# Patient Record
Sex: Male | Born: 1950
Health system: Southern US, Community
[De-identification: ages and names within clinical notes are randomized; demographics above are authoritative.]

## PROBLEM LIST (undated history)

## (undated) DIAGNOSIS — F32A Depression, unspecified: Secondary | ICD-10-CM

## (undated) DIAGNOSIS — M1990 Unspecified osteoarthritis, unspecified site: Secondary | ICD-10-CM

## (undated) DIAGNOSIS — I499 Cardiac arrhythmia, unspecified: Secondary | ICD-10-CM

## (undated) DIAGNOSIS — I739 Peripheral vascular disease, unspecified: Secondary | ICD-10-CM

## (undated) DIAGNOSIS — F419 Anxiety disorder, unspecified: Secondary | ICD-10-CM

## (undated) DIAGNOSIS — I480 Paroxysmal atrial fibrillation: Secondary | ICD-10-CM

## (undated) DIAGNOSIS — J449 Chronic obstructive pulmonary disease, unspecified: Secondary | ICD-10-CM

## (undated) DIAGNOSIS — I639 Cerebral infarction, unspecified: Secondary | ICD-10-CM

## (undated) DIAGNOSIS — R7989 Other specified abnormal findings of blood chemistry: Secondary | ICD-10-CM

## (undated) DIAGNOSIS — I219 Acute myocardial infarction, unspecified: Secondary | ICD-10-CM

## (undated) DIAGNOSIS — I10 Essential (primary) hypertension: Secondary | ICD-10-CM

## (undated) DIAGNOSIS — K219 Gastro-esophageal reflux disease without esophagitis: Secondary | ICD-10-CM

## (undated) DIAGNOSIS — K265 Chronic or unspecified duodenal ulcer with perforation: Secondary | ICD-10-CM

## (undated) DIAGNOSIS — I700 Atherosclerosis of aorta: Secondary | ICD-10-CM

## (undated) DIAGNOSIS — H5440 Blindness, one eye, unspecified eye: Secondary | ICD-10-CM

## (undated) HISTORY — DX: Peripheral vascular disease, unspecified: I73.9

## (undated) HISTORY — PX: EYE SURGERY: SHX253

## (undated) HISTORY — DX: Paroxysmal atrial fibrillation: I48.0

## (undated) HISTORY — PX: FRACTURE SURGERY: SHX138

---

## 2006-08-02 ENCOUNTER — Emergency Department (HOSPITAL_COMMUNITY): Admission: EM | Admit: 2006-08-02 | Discharge: 2006-08-03 | Payer: Self-pay | Admitting: Emergency Medicine

## 2007-10-24 ENCOUNTER — Ambulatory Visit (HOSPITAL_BASED_OUTPATIENT_CLINIC_OR_DEPARTMENT_OTHER): Admission: RE | Admit: 2007-10-24 | Discharge: 2007-10-24 | Payer: Self-pay

## 2007-10-27 ENCOUNTER — Ambulatory Visit: Payer: Self-pay | Admitting: Internal Medicine

## 2010-07-05 NOTE — Procedures (Signed)
NAMEASHAWN, RINEHART              ACCOUNT NO.:  [id number]   MEDICAL RECORD NO.:  [id number]          PATIENT TYPE:  OUT   LOCATION:  SLEEP CENTER                 FACILITY:  [facility]   PHYSICIAN:  Clinton D. Maple Hudson, MD, FCCP, FACPDATE OF BIRTH:  10/28/48   DATE OF STUDY:  10/24/2007                            NOCTURNAL POLYSOMNOGRAM   REFERRING PHYSICIAN:   REFERRING PHYSICIAN:  Dr. Jaci Lazier.   INDICATION FOR STUDY:  Hypersomnia with sleep apnea.   EPWORTH SLEEPINESS SCORE:  Endorsed by patient as 0/24.  BMI 32.2.  Weight 245 pounds.  Height 73 inches.  Neck 18 inches.   MEDICATIONS:  Home medication charted and reviewed.  Bedtime medicine  included  omeprazole, rosuvastatin, terazosin, trazodone,  cyanocobalamin.   SLEEP ARCHITECTURE:  Total sleep time 357 minutes with sleep efficiency  79.8%.  Stage I was 7.8%, stage II 72.7%, stage III absent, REM 19.4% of  total sleep time.  Sleep latency 27 minutes, REM latency 275 minutes.  Awake after sleep onset 63 minutes.  Arousal index 13.4.   RESPIRATORY DATA:  Apnea-hypopnea index (AHI) 4 per hour.  A total of 24  events were scored including 13 obstructive apnea, 9 central apneas and  2 hypopneas.  Events were primarily recorded as nonsupine.  REM AHI 15.5  per hour.  There were insufficient events to permit CPAP titration by  split study protocol on this study night.   OXYGEN DATA:  Mild to moderate snoring with oxygen desaturation to a  nadir of 88%.  Mean oxygen saturation through the study was 93.7% on  room air.   CARDIAC DATA:  Sinus rhythm with occasional PAC.   MOVEMENT-PARASOMNIA:  A total of 197 limb jerks were countered, of which  24 were associated with arousal of awakening for a PLMA index of 4 per  hour.   IMPRESSIONS-RECOMMENDATIONS:  1. Occasional respiratory event with sleep disturbance, apnea-hypopnea      index 4 per hour (normal range 0-5 per hour).  This is within      normal range.  Events were  primarily nonsupine, associated with      mild to moderate snoring and oxygen desaturation to a nadir of 88%.  2. Periodic limb movement with arousal, 4 per hour.  The patient      already takes a number of medications, some of      which may affect sleep quality.  Consider whether therapeutic trial      of a specific medication addressing limb movement, such as Requip      or Mirapex, or clonazepam would be clinically appropriate.      Clinton D. Maple Hudson, MD, FCCP, FACP  Diplomate, Biomedical engineer of Sleep Medicine  Electronically Signed     CDY/MEDQ  D:  10/27/2007 14:06:00  T:  10/28/2007 10:39:44  Job:  [id number]

## 2010-12-08 LAB — BASIC METABOLIC PANEL
BUN: 7
Creatinine, Ser: 1.2
GFR calc non Af Amer: >60

## 2010-12-08 LAB — RAPID URINE DRUG SCREEN, HOSP PERFORMED
Amphetamines: NOT DETECTED
Benzodiazepines: NOT DETECTED

## 2010-12-08 LAB — CBC
Platelets: 244
WBC: 5.5

## 2010-12-08 LAB — URINALYSIS, ROUTINE W REFLEX MICROSCOPIC
Glucose, UA: NEGATIVE
Hgb urine dipstick: NEGATIVE
Ketones, ur: NEGATIVE
Protein, ur: NEGATIVE

## 2010-12-08 LAB — ETHANOL: Alcohol, Ethyl (B): 137 — ABNORMAL HIGH

## 2010-12-08 LAB — DIFFERENTIAL
Eosinophils Absolute: 0.4
Lymphocytes Relative: 28
Lymphs Abs: 1.5
Neutrophils Relative %: 54

## 2010-12-08 LAB — TRICYCLICS SCREEN, URINE: TCA Scrn: NOT DETECTED

## 2015-05-09 ENCOUNTER — Encounter (HOSPITAL_COMMUNITY): Payer: Self-pay | Admitting: Emergency Medicine

## 2015-05-09 ENCOUNTER — Inpatient Hospital Stay (HOSPITAL_COMMUNITY)
Admission: EM | Admit: 2015-05-09 | Discharge: 2015-05-18 | DRG: 329 | Disposition: A | Discharge status: Home Health | Payer: Medicare HMO | Attending: General Surgery | Admitting: General Surgery

## 2015-05-09 DIAGNOSIS — Z8673 Personal history of transient ischemic attack (TIA), and cerebral infarction without residual deficits: Secondary | ICD-10-CM

## 2015-05-09 DIAGNOSIS — J9601 Acute respiratory failure with hypoxia: Secondary | ICD-10-CM | POA: Diagnosis not present

## 2015-05-09 DIAGNOSIS — K6389 Other specified diseases of intestine: Secondary | ICD-10-CM | POA: Diagnosis not present

## 2015-05-09 DIAGNOSIS — R0602 Shortness of breath: Secondary | ICD-10-CM | POA: Diagnosis not present

## 2015-05-09 DIAGNOSIS — J449 Chronic obstructive pulmonary disease, unspecified: Secondary | ICD-10-CM | POA: Diagnosis not present

## 2015-05-09 DIAGNOSIS — I10 Essential (primary) hypertension: Secondary | ICD-10-CM | POA: Diagnosis present

## 2015-05-09 DIAGNOSIS — Z23 Encounter for immunization: Secondary | ICD-10-CM | POA: Diagnosis not present

## 2015-05-09 DIAGNOSIS — J95821 Acute postprocedural respiratory failure: Secondary | ICD-10-CM | POA: Diagnosis not present

## 2015-05-09 DIAGNOSIS — A419 Sepsis, unspecified organism: Secondary | ICD-10-CM | POA: Diagnosis not present

## 2015-05-09 DIAGNOSIS — E876 Hypokalemia: Secondary | ICD-10-CM | POA: Diagnosis present

## 2015-05-09 DIAGNOSIS — R109 Unspecified abdominal pain: Secondary | ICD-10-CM | POA: Diagnosis not present

## 2015-05-09 DIAGNOSIS — R188 Other ascites: Secondary | ICD-10-CM | POA: Diagnosis present

## 2015-05-09 DIAGNOSIS — E669 Obesity, unspecified: Secondary | ICD-10-CM | POA: Diagnosis not present

## 2015-05-09 DIAGNOSIS — F1010 Alcohol abuse, uncomplicated: Secondary | ICD-10-CM

## 2015-05-09 DIAGNOSIS — R198 Other specified symptoms and signs involving the digestive system and abdomen: Secondary | ICD-10-CM

## 2015-05-09 DIAGNOSIS — G9340 Encephalopathy, unspecified: Secondary | ICD-10-CM | POA: Diagnosis not present

## 2015-05-09 DIAGNOSIS — K631 Perforation of intestine (nontraumatic): Secondary | ICD-10-CM | POA: Diagnosis not present

## 2015-05-09 DIAGNOSIS — R1084 Generalized abdominal pain: Secondary | ICD-10-CM | POA: Diagnosis not present

## 2015-05-09 DIAGNOSIS — K265 Chronic or unspecified duodenal ulcer with perforation: Principal | ICD-10-CM | POA: Diagnosis present

## 2015-05-09 DIAGNOSIS — F17200 Nicotine dependence, unspecified, uncomplicated: Secondary | ICD-10-CM

## 2015-05-09 DIAGNOSIS — K2980 Duodenitis without bleeding: Secondary | ICD-10-CM | POA: Diagnosis present

## 2015-05-09 DIAGNOSIS — Z48815 Encounter for surgical aftercare following surgery on the digestive system: Secondary | ICD-10-CM | POA: Diagnosis not present

## 2015-05-09 DIAGNOSIS — Z720 Tobacco use: Secondary | ICD-10-CM

## 2015-05-09 DIAGNOSIS — T8189XA Other complications of procedures, not elsewhere classified, initial encounter: Secondary | ICD-10-CM | POA: Diagnosis not present

## 2015-05-09 DIAGNOSIS — J9811 Atelectasis: Secondary | ICD-10-CM | POA: Diagnosis not present

## 2015-05-09 DIAGNOSIS — Z91199 Patient's noncompliance with other medical treatment and regimen due to unspecified reason: Secondary | ICD-10-CM | POA: Insufficient documentation

## 2015-05-09 DIAGNOSIS — Z9119 Patient's noncompliance with other medical treatment and regimen: Secondary | ICD-10-CM | POA: Diagnosis not present

## 2015-05-09 DIAGNOSIS — F17210 Nicotine dependence, cigarettes, uncomplicated: Secondary | ICD-10-CM | POA: Diagnosis not present

## 2015-05-09 DIAGNOSIS — K659 Peritonitis, unspecified: Secondary | ICD-10-CM | POA: Diagnosis not present

## 2015-05-09 DIAGNOSIS — I252 Old myocardial infarction: Secondary | ICD-10-CM | POA: Diagnosis not present

## 2015-05-09 DIAGNOSIS — J9691 Respiratory failure, unspecified with hypoxia: Secondary | ICD-10-CM

## 2015-05-09 DIAGNOSIS — Z602 Problems related to living alone: Secondary | ICD-10-CM | POA: Diagnosis present

## 2015-05-09 DIAGNOSIS — Z452 Encounter for adjustment and management of vascular access device: Secondary | ICD-10-CM | POA: Diagnosis not present

## 2015-05-09 DIAGNOSIS — K266 Chronic or unspecified duodenal ulcer with both hemorrhage and perforation: Secondary | ICD-10-CM | POA: Diagnosis not present

## 2015-05-09 DIAGNOSIS — K261 Acute duodenal ulcer with perforation: Secondary | ICD-10-CM | POA: Diagnosis not present

## 2015-05-09 DIAGNOSIS — R0600 Dyspnea, unspecified: Secondary | ICD-10-CM | POA: Diagnosis not present

## 2015-05-09 DIAGNOSIS — R531 Weakness: Secondary | ICD-10-CM | POA: Diagnosis not present

## 2015-05-09 DIAGNOSIS — Z6831 Body mass index (BMI) 31.0-31.9, adult: Secondary | ICD-10-CM

## 2015-05-09 HISTORY — DX: Essential (primary) hypertension: I10

## 2015-05-09 HISTORY — DX: Cerebral infarction, unspecified: I63.9

## 2015-05-09 HISTORY — DX: Unspecified osteoarthritis, unspecified site: M19.90

## 2015-05-09 HISTORY — DX: Acute myocardial infarction, unspecified: I21.9

## 2015-05-09 NOTE — ED Notes (Signed)
Pt presents to ER from home with [facility] who reports patient called 911 for generalized weakness and diarrhea x 3 days; pt also reports abd pain 10/10 that began tonight and SOB; EMS reports while on scene pt became diaphoretic when changing from sitting to standing; pt reports abd more distended and taught than normal

## 2015-05-10 ENCOUNTER — Encounter (HOSPITAL_COMMUNITY): Payer: Self-pay | Admitting: Radiology

## 2015-05-10 ENCOUNTER — Emergency Department (HOSPITAL_COMMUNITY): Payer: Medicare HMO

## 2015-05-10 ENCOUNTER — Encounter (HOSPITAL_COMMUNITY): Admission: EM | Disposition: A | Discharge status: Home Health | Payer: Self-pay | Source: Home / Self Care

## 2015-05-10 ENCOUNTER — Inpatient Hospital Stay (HOSPITAL_COMMUNITY): Payer: Medicare HMO

## 2015-05-10 ENCOUNTER — Emergency Department (HOSPITAL_COMMUNITY): Payer: Medicare HMO | Admitting: Anesthesiology

## 2015-05-10 DIAGNOSIS — J9601 Acute respiratory failure with hypoxia: Secondary | ICD-10-CM | POA: Diagnosis not present

## 2015-05-10 DIAGNOSIS — R188 Other ascites: Secondary | ICD-10-CM | POA: Diagnosis not present

## 2015-05-10 DIAGNOSIS — I10 Essential (primary) hypertension: Secondary | ICD-10-CM | POA: Diagnosis not present

## 2015-05-10 DIAGNOSIS — F17210 Nicotine dependence, cigarettes, uncomplicated: Secondary | ICD-10-CM | POA: Diagnosis not present

## 2015-05-10 DIAGNOSIS — Z9119 Patient's noncompliance with other medical treatment and regimen: Secondary | ICD-10-CM | POA: Diagnosis not present

## 2015-05-10 DIAGNOSIS — Z720 Tobacco use: Secondary | ICD-10-CM | POA: Diagnosis not present

## 2015-05-10 DIAGNOSIS — I252 Old myocardial infarction: Secondary | ICD-10-CM | POA: Diagnosis not present

## 2015-05-10 DIAGNOSIS — K265 Chronic or unspecified duodenal ulcer with perforation: Secondary | ICD-10-CM | POA: Diagnosis present

## 2015-05-10 DIAGNOSIS — R0600 Dyspnea, unspecified: Secondary | ICD-10-CM | POA: Diagnosis not present

## 2015-05-10 DIAGNOSIS — J95821 Acute postprocedural respiratory failure: Secondary | ICD-10-CM | POA: Diagnosis not present

## 2015-05-10 DIAGNOSIS — J449 Chronic obstructive pulmonary disease, unspecified: Secondary | ICD-10-CM | POA: Diagnosis not present

## 2015-05-10 DIAGNOSIS — A419 Sepsis, unspecified organism: Secondary | ICD-10-CM

## 2015-05-10 DIAGNOSIS — J9811 Atelectasis: Secondary | ICD-10-CM | POA: Diagnosis not present

## 2015-05-10 DIAGNOSIS — Z6831 Body mass index (BMI) 31.0-31.9, adult: Secondary | ICD-10-CM | POA: Diagnosis not present

## 2015-05-10 DIAGNOSIS — G9340 Encephalopathy, unspecified: Secondary | ICD-10-CM | POA: Diagnosis not present

## 2015-05-10 DIAGNOSIS — E669 Obesity, unspecified: Secondary | ICD-10-CM | POA: Diagnosis not present

## 2015-05-10 DIAGNOSIS — Z602 Problems related to living alone: Secondary | ICD-10-CM | POA: Diagnosis not present

## 2015-05-10 DIAGNOSIS — Z23 Encounter for immunization: Secondary | ICD-10-CM | POA: Diagnosis not present

## 2015-05-10 DIAGNOSIS — E876 Hypokalemia: Secondary | ICD-10-CM | POA: Diagnosis not present

## 2015-05-10 DIAGNOSIS — K2980 Duodenitis without bleeding: Secondary | ICD-10-CM | POA: Diagnosis not present

## 2015-05-10 DIAGNOSIS — K659 Peritonitis, unspecified: Secondary | ICD-10-CM | POA: Diagnosis present

## 2015-05-10 DIAGNOSIS — Z8673 Personal history of transient ischemic attack (TIA), and cerebral infarction without residual deficits: Secondary | ICD-10-CM | POA: Diagnosis not present

## 2015-05-10 HISTORY — PX: LAPAROTOMY: SHX154

## 2015-05-10 LAB — I-STAT CHEM 8, ED
BUN: 22 mg/dL — AB (ref 6–20)
CALCIUM ION: 0.91 mmol/L — AB (ref 1.13–1.30)
CHLORIDE: 99 mmol/L — AB (ref 101–111)
CREATININE: 1.2 mg/dL (ref 0.61–1.24)
GLUCOSE: 129 mg/dL — AB (ref 65–99)
HCT: 51 % (ref 39.0–52.0)
Hemoglobin: 17.3 g/dL — ABNORMAL HIGH (ref 13.0–17.0)
POTASSIUM: 3.9 mmol/L (ref 3.5–5.1)
Sodium: 131 mmol/L — ABNORMAL LOW (ref 135–145)
TCO2: 18 mmol/L (ref 0–100)

## 2015-05-10 LAB — POCT I-STAT 3, ART BLOOD GAS (G3+)
Acid-base deficit: 5 mmol/L — ABNORMAL HIGH (ref 0.0–2.0)
Acid-base deficit: 5 mmol/L — ABNORMAL HIGH (ref 0.0–2.0)
BICARBONATE: 23.5 meq/L (ref 20.0–24.0)
Bicarbonate: 21.2 mEq/L (ref 20.0–24.0)
O2 SAT: 100 %
O2 Saturation: 99 %
PCO2 ART: 57.8 mmHg — AB (ref 35.0–45.0)
PCO2 ART: 43.1 mmHg (ref 35.0–45.0)
PH ART: 7.217 — AB (ref 7.350–7.450)
PH ART: 7.299 — AB (ref 7.350–7.450)
PO2 ART: 533 mmHg — AB (ref 80.0–100.0)
Patient temperature: 98.6
TCO2: 25 mmol/L (ref 0–100)
TCO2: 22 mmol/L (ref 0–100)
pO2, Arterial: 163 mmHg — ABNORMAL HIGH (ref 80.0–100.0)

## 2015-05-10 LAB — TYPE AND SCREEN
ABO/RH(D): A POS
ANTIBODY SCREEN: NEGATIVE

## 2015-05-10 LAB — CBC WITH DIFFERENTIAL/PLATELET
BASOS ABS: 0 10*3/uL (ref 0.0–0.1)
BASOS PCT: 0 %
Eosinophils Absolute: 0 10*3/uL (ref 0.0–0.7)
Eosinophils Relative: 0 %
HEMATOCRIT: 45.1 % (ref 39.0–52.0)
HEMOGLOBIN: 16.2 g/dL (ref 13.0–17.0)
LYMPHS PCT: 13 %
Lymphs Abs: 0.7 10*3/uL (ref 0.7–4.0)
MCH: 36.3 pg — ABNORMAL HIGH (ref 26.0–34.0)
MCHC: 35.9 g/dL (ref 30.0–36.0)
MCV: 101.1 fL — AB (ref 78.0–100.0)
Monocytes Absolute: 0.3 10*3/uL (ref 0.1–1.0)
Monocytes Relative: 5 %
NEUTROS ABS: 4.2 10*3/uL (ref 1.7–7.7)
NEUTROS PCT: 82 %
Platelets: 133 10*3/uL — ABNORMAL LOW (ref 150–400)
RBC: 4.46 MIL/uL (ref 4.22–5.81)
RDW: 13.9 % (ref 11.5–15.5)
WBC: 5.2 10*3/uL (ref 4.0–10.5)

## 2015-05-10 LAB — CBC
HCT: 42 % (ref 39.0–52.0)
Hemoglobin: 14.7 g/dL (ref 13.0–17.0)
MCH: 35.9 pg — AB (ref 26.0–34.0)
MCHC: 35 g/dL (ref 30.0–36.0)
MCV: 102.4 fL — AB (ref 78.0–100.0)
PLATELETS: 126 10*3/uL — AB (ref 150–400)
RBC: 4.1 MIL/uL — ABNORMAL LOW (ref 4.22–5.81)
RDW: 14.1 % (ref 11.5–15.5)
WBC: 8.5 10*3/uL (ref 4.0–10.5)

## 2015-05-10 LAB — BASIC METABOLIC PANEL
Anion gap: 8 (ref 5–15)
BUN: 19 mg/dL (ref 6–20)
CALCIUM: 7 mg/dL — AB (ref 8.9–10.3)
CO2: 20 mmol/L — ABNORMAL LOW (ref 22–32)
CREATININE: 1.21 mg/dL (ref 0.61–1.24)
Chloride: 107 mmol/L (ref 101–111)
GFR calc Af Amer: >60 mL/min (ref >60)
GLUCOSE: 177 mg/dL — AB (ref 65–99)
Potassium: 4.3 mmol/L (ref 3.5–5.1)
SODIUM: 135 mmol/L (ref 135–145)

## 2015-05-10 LAB — MRSA PCR SCREENING: MRSA by PCR: NEGATIVE

## 2015-05-10 LAB — COMPREHENSIVE METABOLIC PANEL
ALBUMIN: 3.6 g/dL (ref 3.5–5.0)
ALT: 47 U/L (ref 17–63)
AST: 40 U/L (ref 15–41)
Alkaline Phosphatase: 66 U/L (ref 38–126)
Anion gap: 17 — ABNORMAL HIGH (ref 5–15)
BILIRUBIN TOTAL: 0.6 mg/dL (ref 0.3–1.2)
BUN: 18 mg/dL (ref 6–20)
CHLORIDE: 98 mmol/L — AB (ref 101–111)
CO2: 18 mmol/L — ABNORMAL LOW (ref 22–32)
CREATININE: 1.33 mg/dL — AB (ref 0.61–1.24)
Calcium: 8.1 mg/dL — ABNORMAL LOW (ref 8.9–10.3)
GFR calc Af Amer: >60 mL/min (ref >60)
GFR, EST NON AFRICAN AMERICAN: 54 mL/min — AB (ref >60)
GLUCOSE: 134 mg/dL — AB (ref 65–99)
Potassium: 4 mmol/L (ref 3.5–5.1)
Sodium: 133 mmol/L — ABNORMAL LOW (ref 135–145)
Total Protein: 7.4 g/dL (ref 6.5–8.1)

## 2015-05-10 LAB — GLUCOSE, CAPILLARY
GLUCOSE-CAPILLARY: 166 mg/dL — AB (ref 65–99)
GLUCOSE-CAPILLARY: 132 mg/dL — AB (ref 65–99)
Glucose-Capillary: 155 mg/dL — ABNORMAL HIGH (ref 65–99)

## 2015-05-10 LAB — LIPASE, BLOOD: LIPASE: 68 U/L — AB (ref 11–51)

## 2015-05-10 LAB — PROTIME-INR
INR: 1.12 (ref 0.00–1.49)
PROTHROMBIN TIME: 14.6 s (ref 11.6–15.2)

## 2015-05-10 LAB — ABO/RH: ABO/RH(D): A POS

## 2015-05-10 LAB — I-STAT CG4 LACTIC ACID, ED: LACTIC ACID, VENOUS: 1.24 mmol/L (ref 0.5–2.0)

## 2015-05-10 SURGERY — LAPAROTOMY, EXPLORATORY
Anesthesia: General | Site: Abdomen

## 2015-05-10 MED ORDER — ROCURONIUM BROMIDE 50 MG/5ML IV SOLN
INTRAVENOUS | Status: AC
  Filled 2015-05-10: qty 1

## 2015-05-10 MED ORDER — ANTISEPTIC ORAL RINSE SOLUTION (CORINZ)
7.0000 mL | Freq: Four times a day (QID) | OROMUCOSAL | Status: DC
  Administered 2015-05-10: 7 mL via OROMUCOSAL

## 2015-05-10 MED ORDER — ALBUTEROL SULFATE (2.5 MG/3ML) 0.083% IN NEBU
2.5000 mg | INHALATION_SOLUTION | RESPIRATORY_TRACT | Status: DC | PRN
  Administered 2015-05-11 – 2015-05-18 (×2): 2.5 mg via RESPIRATORY_TRACT
  Filled 2015-05-10 (×2): qty 3

## 2015-05-10 MED ORDER — FENTANYL CITRATE (PF) 100 MCG/2ML IJ SOLN: 50.0000 ug | Freq: Once | INTRAMUSCULAR | Status: DC

## 2015-05-10 MED ORDER — 0.9 % SODIUM CHLORIDE (POUR BTL) OPTIME
TOPICAL | Status: DC | PRN
  Administered 2015-05-10: 4000 mL

## 2015-05-10 MED ORDER — FENTANYL BOLUS VIA INFUSION
25.0000 ug | INTRAVENOUS | Status: DC | PRN
  Filled 2015-05-10: qty 25

## 2015-05-10 MED ORDER — PROPOFOL 10 MG/ML IV BOLUS
INTRAVENOUS | Status: DC | PRN
  Administered 2015-05-10: 150 mg via INTRAVENOUS

## 2015-05-10 MED ORDER — HYDROMORPHONE HCL 1 MG/ML IJ SOLN
1.0000 mg | Freq: Once | INTRAMUSCULAR | Status: AC
  Administered 2015-05-10: 1 mg via INTRAVENOUS
  Filled 2015-05-10: qty 1

## 2015-05-10 MED ORDER — SODIUM CHLORIDE 0.9 % IV BOLUS (SEPSIS)
2000.0000 mL | Freq: Once | INTRAVENOUS | Status: AC
  Administered 2015-05-10: 2000 mL via INTRAVENOUS

## 2015-05-10 MED ORDER — CHLORHEXIDINE GLUCONATE 0.12% ORAL RINSE (MEDLINE KIT)
15.0000 mL | Freq: Two times a day (BID) | OROMUCOSAL | Status: DC
  Administered 2015-05-10: 15 mL via OROMUCOSAL

## 2015-05-10 MED ORDER — MIDAZOLAM HCL 2 MG/2ML IJ SOLN
INTRAMUSCULAR | Status: AC
  Filled 2015-05-10: qty 2

## 2015-05-10 MED ORDER — LIDOCAINE HCL (CARDIAC) 20 MG/ML IV SOLN
INTRAVENOUS | Status: DC | PRN
  Administered 2015-05-10: 60 mg via INTRATRACHEAL

## 2015-05-10 MED ORDER — SODIUM CHLORIDE 0.9 % IV SOLN
INTRAVENOUS | Status: DC | PRN
  Administered 2015-05-10 (×2): via INTRAVENOUS

## 2015-05-10 MED ORDER — FENTANYL CITRATE (PF) 250 MCG/5ML IJ SOLN
INTRAMUSCULAR | Status: DC | PRN
  Administered 2015-05-10 (×2): 100 ug via INTRAVENOUS
  Administered 2015-05-10: 50 ug via INTRAVENOUS
  Administered 2015-05-10: 150 ug via INTRAVENOUS
  Administered 2015-05-10 (×2): 50 ug via INTRAVENOUS

## 2015-05-10 MED ORDER — LIDOCAINE HCL (CARDIAC) 20 MG/ML IV SOLN
INTRAVENOUS | Status: AC
  Filled 2015-05-10: qty 5

## 2015-05-10 MED ORDER — ROCURONIUM BROMIDE 100 MG/10ML IV SOLN
INTRAVENOUS | Status: DC | PRN
  Administered 2015-05-10 (×2): 50 mg via INTRAVENOUS

## 2015-05-10 MED ORDER — LACTATED RINGERS IV BOLUS (SEPSIS)
1000.0000 mL | Freq: Once | INTRAVENOUS | Status: AC
  Administered 2015-05-10: 1000 mL via INTRAVENOUS

## 2015-05-10 MED ORDER — DEXMEDETOMIDINE HCL IN NACL 200 MCG/50ML IV SOLN
INTRAVENOUS | Status: DC | PRN
  Administered 2015-05-10: 0.7 ug/kg/h via INTRAVENOUS

## 2015-05-10 MED ORDER — HYDROMORPHONE HCL 1 MG/ML IJ SOLN: 1.0000 mg | Freq: Once | INTRAMUSCULAR | Status: DC

## 2015-05-10 MED ORDER — FENTANYL CITRATE (PF) 250 MCG/5ML IJ SOLN
INTRAMUSCULAR | Status: AC
  Filled 2015-05-10: qty 5

## 2015-05-10 MED ORDER — SUCCINYLCHOLINE CHLORIDE 20 MG/ML IJ SOLN
INTRAMUSCULAR | Status: AC
  Filled 2015-05-10: qty 1

## 2015-05-10 MED ORDER — ONDANSETRON HCL 4 MG/2ML IJ SOLN
4.0000 mg | Freq: Once | INTRAMUSCULAR | Status: AC
  Administered 2015-05-10: 4 mg via INTRAVENOUS
  Filled 2015-05-10: qty 2

## 2015-05-10 MED ORDER — PANTOPRAZOLE SODIUM 40 MG IV SOLR
40.0000 mg | Freq: Every day | INTRAVENOUS | Status: DC
  Administered 2015-05-10 – 2015-05-14 (×5): 40 mg via INTRAVENOUS
  Filled 2015-05-10 (×6): qty 40

## 2015-05-10 MED ORDER — SUCCINYLCHOLINE CHLORIDE 20 MG/ML IJ SOLN
INTRAMUSCULAR | Status: DC | PRN
  Administered 2015-05-10: 120 mg via INTRAVENOUS

## 2015-05-10 MED ORDER — SODIUM CHLORIDE 0.9 % IV SOLN: 25.0000 ug/h | INTRAVENOUS | Status: DC

## 2015-05-10 MED ORDER — PIPERACILLIN-TAZOBACTAM 3.375 G IVPB
3.3750 g | Freq: Once | INTRAVENOUS | Status: AC
  Administered 2015-05-10: 3.375 g via INTRAVENOUS
  Filled 2015-05-10: qty 50

## 2015-05-10 MED ORDER — FLUCONAZOLE IN SODIUM CHLORIDE 400-0.9 MG/200ML-% IV SOLN
400.0000 mg | INTRAVENOUS | Status: DC
  Administered 2015-05-10: 400 mg via INTRAVENOUS
  Filled 2015-05-10 (×2): qty 200

## 2015-05-10 MED ORDER — DEXTROSE-NACL 5-0.9 % IV SOLN
INTRAVENOUS | Status: DC
  Administered 2015-05-10 – 2015-05-16 (×10): via INTRAVENOUS

## 2015-05-10 MED ORDER — SODIUM CHLORIDE 0.9 % IV SOLN
1.0000 mg/h | INTRAVENOUS | Status: DC
  Administered 2015-05-10: 2 mg/h via INTRAVENOUS
  Filled 2015-05-10: qty 10

## 2015-05-10 MED ORDER — PIPERACILLIN-TAZOBACTAM 3.375 G IVPB
3.3750 g | Freq: Three times a day (TID) | INTRAVENOUS | Status: DC
  Administered 2015-05-10 – 2015-05-18 (×23): 3.375 g via INTRAVENOUS
  Filled 2015-05-10 (×29): qty 50

## 2015-05-10 MED ORDER — HYDROMORPHONE HCL 1 MG/ML IJ SOLN
1.0000 mg | INTRAMUSCULAR | Status: AC | PRN
  Administered 2015-05-10 (×3): 1 mg via INTRAVENOUS
  Filled 2015-05-10 (×3): qty 1

## 2015-05-10 MED ORDER — MIDAZOLAM HCL 2 MG/2ML IJ SOLN
INTRAMUSCULAR | Status: DC | PRN
  Administered 2015-05-10 (×2): 1 mg via INTRAVENOUS

## 2015-05-10 MED ORDER — ENOXAPARIN SODIUM 40 MG/0.4ML ~~LOC~~ SOLN
40.0000 mg | SUBCUTANEOUS | Status: DC
Start: 2015-05-11 — End: 2015-05-18
  Administered 2015-05-11 – 2015-05-18 (×8): 40 mg via SUBCUTANEOUS
  Filled 2015-05-10 (×8): qty 0.4

## 2015-05-10 MED ORDER — FENTANYL CITRATE (PF) 100 MCG/2ML IJ SOLN
50.0000 ug | Freq: Once | INTRAMUSCULAR | Status: DC
Start: 2015-05-10 — End: 2015-05-10

## 2015-05-10 MED ORDER — DEXMEDETOMIDINE HCL IN NACL 200 MCG/50ML IV SOLN
0.4000 ug/kg/h | INTRAVENOUS | Status: DC
  Administered 2015-05-10: 0.7 ug/kg/h via INTRAVENOUS
  Filled 2015-05-10 (×2): qty 50

## 2015-05-10 MED ORDER — LACTATED RINGERS IV SOLN: INTRAVENOUS | Status: DC

## 2015-05-10 MED ORDER — FENTANYL CITRATE (PF) 2500 MCG/50ML IJ SOLN
20.0000 ug/h | INTRAMUSCULAR | Status: DC
  Administered 2015-05-10: 25 ug/h via INTRAVENOUS
  Filled 2015-05-10 (×2): qty 50

## 2015-05-10 MED ORDER — LACTATED RINGERS IV SOLN
INTRAVENOUS | Status: DC | PRN
  Administered 2015-05-10: 06:00:00 via INTRAVENOUS

## 2015-05-10 MED ORDER — PROPOFOL 10 MG/ML IV BOLUS
INTRAVENOUS | Status: AC
  Filled 2015-05-10: qty 20

## 2015-05-10 SURGICAL SUPPLY — 44 items
BLADE SURG ROTATE 9660 (MISCELLANEOUS) ×3 IMPLANT
BNDG GAUZE ELAST 4 BULKY (GAUZE/BANDAGES/DRESSINGS) ×3 IMPLANT
CANISTER SUCTION 2500CC (MISCELLANEOUS) ×3 IMPLANT
CHLORAPREP W/TINT 26ML (MISCELLANEOUS) ×3 IMPLANT
COVER SURGICAL LIGHT HANDLE (MISCELLANEOUS) ×3 IMPLANT
DRAIN CHANNEL 19F RND (DRAIN) ×3 IMPLANT
DRAPE LAPAROSCOPIC ABDOMINAL (DRAPES) ×3 IMPLANT
DRAPE WARM FLUID 44X44 (DRAPE) IMPLANT
DRSG OPSITE POSTOP 4X10 (GAUZE/BANDAGES/DRESSINGS) IMPLANT
DRSG OPSITE POSTOP 4X8 (GAUZE/BANDAGES/DRESSINGS) IMPLANT
ELECT BLADE 6.5 EXT (BLADE) ×3 IMPLANT
ELECT CAUTERY BLADE 6.4 (BLADE) IMPLANT
ELECT REM PT RETURN 9FT ADLT (ELECTROSURGICAL) ×3
ELECTRODE REM PT RTRN 9FT ADLT (ELECTROSURGICAL) ×1 IMPLANT
EVACUATOR SILICONE 100CC (DRAIN) ×3 IMPLANT
GLOVE BIO SURGEON STRL SZ7.5 (GLOVE) ×3 IMPLANT
GLOVE BIOGEL PI IND STRL 8 (GLOVE) ×2 IMPLANT
GLOVE BIOGEL PI INDICATOR 8 (GLOVE) ×4
GOWN STRL REUS W/ TWL LRG LVL3 (GOWN DISPOSABLE) IMPLANT
GOWN STRL REUS W/ TWL XL LVL3 (GOWN DISPOSABLE) ×2 IMPLANT
GOWN STRL REUS W/TWL LRG LVL3 (GOWN DISPOSABLE)
GOWN STRL REUS W/TWL XL LVL3 (GOWN DISPOSABLE) ×4
KIT BASIN OR (CUSTOM PROCEDURE TRAY) ×3 IMPLANT
KIT ROOM TURNOVER OR (KITS) ×3 IMPLANT
LIGASURE IMPACT 36 18CM CVD LR (INSTRUMENTS) IMPLANT
NS IRRIG 1000ML POUR BTL (IV SOLUTION) ×12 IMPLANT
PACK GENERAL/GYN (CUSTOM PROCEDURE TRAY) ×3 IMPLANT
PAD ABD 8X10 STRL (GAUZE/BANDAGES/DRESSINGS) ×3 IMPLANT
PAD ARMBOARD 7.5X6 YLW CONV (MISCELLANEOUS) ×3 IMPLANT
SPECIMEN JAR LARGE (MISCELLANEOUS) IMPLANT
SPONGE LAP 18X18 X RAY DECT (DISPOSABLE) IMPLANT
STAPLER VISISTAT 35W (STAPLE) IMPLANT
SUCTION POOLE TIP (SUCTIONS) ×3 IMPLANT
SUT ETHILON 2 0 FS 18 (SUTURE) ×3 IMPLANT
SUT PDS AB 1 TP1 96 (SUTURE) ×9 IMPLANT
SUT SILK 2 0 SH CR/8 (SUTURE) IMPLANT
SUT SILK 2 0 TIES 10X30 (SUTURE) IMPLANT
SUT SILK 3 0 SH CR/8 (SUTURE) ×3 IMPLANT
SUT SILK 3 0 TIES 10X30 (SUTURE) IMPLANT
TAPE CLOTH SURG 6X10 WHT LF (GAUZE/BANDAGES/DRESSINGS) ×3 IMPLANT
TOWEL OR 17X24 6PK STRL BLUE (TOWEL DISPOSABLE) IMPLANT
TOWEL OR 17X26 10 PK STRL BLUE (TOWEL DISPOSABLE) ×6 IMPLANT
TRAY FOLEY CATH 16FRSI W/METER (SET/KITS/TRAYS/PACK) ×3 IMPLANT
YANKAUER SUCT BULB TIP NO VENT (SUCTIONS) IMPLANT

## 2015-05-10 NOTE — H&P (Signed)
Gregory Lane is an 65 y.o. male.   Chief Complaint: Abdominal pain HPI: Patient is a 65 year old male who comes in today secondary to abdominal pain which she states started between 8 and 10 PM. He states the pain was unrelenting thereafter. He states he noticed no pain prior to this. Patient presented to ER for further evaluation.  Upon evaluation ER the patient underwent CT scan and laboratory functions. CT scan revealed perforated viscus, free air, but could not localize the perforated viscus. Surgery was consult for further management. Patient was given antibiotics in the ER.  Past Medical History  Diagnosis Date  . Hypertension   . Stroke ([place])   . Myocardial infarction [facility])     Past Surgical History  Procedure Laterality Date  . Fracture surgery    . Eye surgery      History reviewed. No pertinent family history. Social History:  reports that he has been smoking.  He does not have any smokeless tobacco history on file. His alcohol and drug histories are not on file.  Allergies: No Known Allergies   (Not in a hospital admission)  Results for orders placed or performed during the hospital encounter of 05/09/15 (from the past 48 hour(s))  Lipase, blood     Status: Abnormal   Collection Time: 05/10/15 12:02 AM  Result Value Ref Range   Lipase 68 (H) 11 - 51 U/L  Comprehensive metabolic panel     Status: Abnormal   Collection Time: 05/10/15 12:02 AM  Result Value Ref Range   Sodium 133 (L) 135 - 145 mmol/L   Potassium 4.0 3.5 - 5.1 mmol/L   Chloride 98 (L) 101 - 111 mmol/L   CO2 18 (L) 22 - 32 mmol/L   Glucose, Bld 134 (H) 65 - 99 mg/dL   BUN 18 6 - 20 mg/dL   Creatinine, Ser 1.33 (H) 0.61 - 1.24 mg/dL   Calcium 8.1 (L) 8.9 - 10.3 mg/dL   Total Protein 7.4 6.5 - 8.1 g/dL   Albumin 3.6 3.5 - 5.0 g/dL   AST 40 15 - 41 U/L   ALT 47 17 - 63 U/L   Alkaline Phosphatase 66 38 - 126 U/L   Total Bilirubin 0.6 0.3 - 1.2 mg/dL   GFR calc non Af Amer 54 (L) >60 mL/min   GFR calc Af Amer >60 >60 mL/min    Comment: (NOTE) The eGFR has been calculated using the CKD EPI equation. This calculation has not been validated in all clinical situations. eGFR's persistently <60 mL/min signify possible Chronic Kidney Disease.    Anion gap 17 (H) 5 - 15  Protime-INR     Status: None   Collection Time: 05/10/15 12:02 AM  Result Value Ref Range   Prothrombin Time 14.6 11.6 - 15.2 seconds   INR 1.12 0.00 - 1.49  CBC with Differential     Status: Abnormal   Collection Time: 05/10/15 12:02 AM  Result Value Ref Range   WBC 5.2 4.0 - 10.5 K/uL   RBC 4.46 4.22 - 5.81 MIL/uL   Hemoglobin 16.2 13.0 - 17.0 g/dL   HCT 45.1 39.0 - 52.0 %   MCV 101.1 (H) 78.0 - 100.0 fL   MCH 36.3 (H) 26.0 - 34.0 pg   MCHC 35.9 30.0 - 36.0 g/dL   RDW 13.9 11.5 - 15.5 %   Platelets 133 (L) 150 - 400 K/uL   Neutrophils Relative % 82 %   Neutro Abs 4.2 1.7 - 7.7 K/uL  Lymphocytes Relative 13 %   Lymphs Abs 0.7 0.7 - 4.0 K/uL   Monocytes Relative 5 %   Monocytes Absolute 0.3 0.1 - 1.0 K/uL   Eosinophils Relative 0 %   Eosinophils Absolute 0.0 0.0 - 0.7 K/uL   Basophils Relative 0 %   Basophils Absolute 0.0 0.0 - 0.1 K/uL  I-Stat CG4 Lactic Acid, ED     Status: None   Collection Time: 05/10/15 12:12 AM  Result Value Ref Range   Lactic Acid, Venous 1.24 0.5 - 2.0 mmol/L  I-Stat Chem 8, ED     Status: Abnormal   Collection Time: 05/10/15 12:17 AM  Result Value Ref Range   Sodium 131 (L) 135 - 145 mmol/L   Potassium 3.9 3.5 - 5.1 mmol/L   Chloride 99 (L) 101 - 111 mmol/L   BUN 22 (H) 6 - 20 mg/dL   Creatinine, Ser 1.20 0.61 - 1.24 mg/dL   Glucose, Bld 129 (H) 65 - 99 mg/dL   Calcium, Ion 0.91 (L) 1.13 - 1.30 mmol/L   TCO2 18 0 - 100 mmol/L   Hemoglobin 17.3 (H) 13.0 - 17.0 g/dL   HCT 51.0 39.0 - 52.0 %  Type and screen     Status: None   Collection Time: 05/10/15 12:57 AM  Result Value Ref Range   ABO/RH(D) A POS    Antibody Screen NEG    Sample Expiration 05/13/2015   ABO/Rh      Status: None   Collection Time: 05/10/15 12:57 AM  Result Value Ref Range   ABO/RH(D) A POS    Dg Abd Acute W/chest  05/10/2015  CLINICAL DATA:  Severe abdominal pain.  Diarrhea and fever today. EXAM: DG ABDOMEN ACUTE W/ 1V CHEST COMPARISON:  None. FINDINGS: Normal heart size and pulmonary vascularity.  Lungs are clear. Diffuse free air in the upper abdomen. This suggests bowel perforation in the absence of recent surgery. Scattered gas in the colon. No small or large bowel distention. No radiopaque stones. Degenerative changes in the spine. IMPRESSION: Diffuse free air in the upper abdomen, worrisome for bowel perforation. No evidence of active pulmonary disease. No bowel distention. These results were called by telephone at the time of interpretation on 05/10/2015 at 12:58 am to Dr. Varney Biles , who verbally acknowledged these results. Electronically Signed   By: Lucienne Capers M.D.   On: 05/10/2015 00:58   Ct Cta Abd/pel W/cm &/or W/o Cm  05/10/2015  CLINICAL DATA:  Bloody diarrhea and weakness for 3 days. Sudden onset at 22:00 of midline sharp stabbing pain. EXAM: CTA ABDOMEN AND PELVIS wITHOUT AND WITH CONTRAST TECHNIQUE: Multidetector CT imaging of the abdomen and pelvis was performed using the standard protocol during bolus administration of intravenous contrast. Multiplanar reconstructed images and MIPs were obtained and reviewed to evaluate the vascular anatomy. CONTRAST:  100 mL Omnipaque 350 intravenous COMPARISON:  Radiographs 05/10/2015 FINDINGS: Lower chest:  No significant abnormality Hepatobiliary: Mild diffuse fatty infiltration of the liver without focal lesion. No bile duct dilatation. Gallbladder is unremarkable. No portal venous air. Pancreas: Normal Spleen: Normal Adrenals/Urinary Tract: The adrenals and kidneys are normal in appearance. There is no urinary calculus evident. There is no hydronephrosis or ureteral dilatation. Collecting systems and ureters appear unremarkable.  Stomach/Bowel: Stomach and small bowel appear unremarkable. There is mild mural edema of the ascending colon with adjacent fat stranding. No pneumatosis. No bowel obstruction. Large volume free intraperitoneal air consistent with perforated viscus. Vascular/Lymphatic: The abdominal aorta is normal in caliber.  There is mild atherosclerotic calcification. There is no adenopathy in the abdomen or pelvis. 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 Reproductive: Unremarkable Other: Small volume ascites collected dependently. Musculoskeletal: No significant musculoskeletal abnormality. Moderately severe degenerative lumbar disc disease is present at L4-5. Review of the MIP images confirms the above findings. IMPRESSION: Large volume free intraperitoneal air consistent with perforated hollow viscus. There is mild mural edema and adjacent fat stranding in the ascending colon without discrete mass. This could represent colitis or ischemia, but the location of the bowel perforation is not conclusive on this scan. These results were called by telephone at the time of interpretation on 05/10/2015 at 2:11 am to Dr. Varney Biles , who verbally acknowledged these results. Electronically Signed   By: Andreas Newport M.D.   On: 05/10/2015 02:16    Review of Systems  Constitutional: Negative.   HENT: Negative.   Cardiovascular: Negative.   Gastrointestinal: Positive for abdominal pain. Negative for nausea and vomiting.  Musculoskeletal: Negative.   Neurological: Negative.     Blood pressure 173/89, pulse 91, temperature 98.4 F (36.9 C), temperature source Oral, resp. rate 17, height 6' 1" (1.854 m), weight 106.595 kg (235 lb), SpO2 96 %. Physical Exam  Constitutional: He is oriented to person, place, and time. He appears well-developed and well-nourished.  HENT:  Head: Normocephalic and atraumatic.  Eyes: Conjunctivae and EOM are normal. Pupils are equal, round, and reactive to  light.  Neck: Normal range of motion. Neck supple.  Cardiovascular: Normal rate, regular rhythm and normal heart sounds.   Respiratory: Effort normal. He has wheezes.  GI: Bowel sounds are normal. He exhibits distension. There is tenderness. There is rebound and guarding.  Musculoskeletal: Normal range of motion.  Neurological: He is alert and oriented to person, place, and time.  Skin: Skin is warm and dry.     Assessment/Plan 65 year old male with perforated viscus 1. History of MI 2. History of CVA  Plan: 1. We will proceed emergently to the operating room for emergent exploratory laparotomy. 2. I discussed the patient was amenable to the procedure to include but not limited to: Infection, bleeding, damage to structures, possible need for ostomy and/or bowel resection. Patient was understanding and wished to proceed.  Reyes Ivan, MD 05/10/2015, 3:50 AM

## 2015-05-10 NOTE — Consult Note (Signed)
PULMONARY / CRITICAL CARE MEDICINE   Name: Gregory Lane MRN: [id number] DOB: 01-Aug-1950    ADMISSION DATE:  05/09/2015 CONSULTATION DATE:  05/10/2015  REFERRING MD:  CCS  CHIEF COMPLAINT:  VDRF post op.  HISTORY OF PRESENT ILLNESS:   65 year old male with history of HTN and previous CVA who presented to the ICU from PACU post abdominal surgery for a perforated viscus.  Post op patient was not extubatable and was sent to the ICU intubated.  PCCM was consulted for vent management.  PAST MEDICAL HISTORY :  He  has a past medical history of Hypertension; Stroke [facility]); and Myocardial infarction ([place]).  PAST SURGICAL HISTORY: He  has past surgical history that includes Fracture surgery and Eye surgery.  No Known Allergies  No current facility-administered medications on file prior to encounter.   No current outpatient prescriptions on file prior to encounter.   FAMILY HISTORY:  His has no family status information on file.   SOCIAL HISTORY: He  reports that he has been smoking.  He does not have any smokeless tobacco history on file.  REVIEW OF SYSTEMS:   Unattainable, patient is intubated.  SUBJECTIVE:  Sedated and intubated.  VITAL SIGNS: BP 116/60 mmHg  Pulse 66  Temp(Src) 99.1 F (37.3 C) (Oral)  Resp 18  Ht '6\' 1"'$  (1.854 m)  Wt 106.595 kg (235 lb)  BMI 31.01 kg/m2  SpO2 100%  HEMODYNAMICS:    VENTILATOR SETTINGS: Vent Mode:  [-] PRVC FiO2 (%):  [50 %-100 %] 50 % Set Rate:  [14 bmp-18 bmp] 18 bmp Vt Set:  [620 mL-640 mL] 640 mL PEEP:  [5 cmH20] 5 cmH20 Plateau Pressure:  [20 NWG95-62 cmH20] 20 cmH20  INTAKE / OUTPUT: I/O last 3 completed shifts: In: 1250 [I.V.:1250] Out: 500 [Urine:400; Blood:100]  PHYSICAL EXAMINATION: General:  Chronically ill appearing male, NAD on vent. Neuro:  Arousable but sedate. HEENT:  [place]/AT, PERRL, EOM-I and MMM. Cardiovascular:  RRR, Nl S1/S2, -M/R/G. Lungs:  Coarse BS diffusely. Abdomen:  Soft, NT, ND and  +BS. Musculoskeletal:  -edema and -tenderness. Skin:  Intact.  Wound dressed.  LABS:  BMET  Recent Labs Lab 05/10/15 0002 05/10/15 0017 05/10/15 1100  NA 133* 131* 135  K 4.0 3.9 4.3  CL 98* 99* 107  CO2 18*  --  20*  BUN 18 22* 19  CREATININE 1.33* 1.20 1.21  GLUCOSE 134* 129* 177*    Electrolytes  Recent Labs Lab 05/10/15 0002 05/10/15 1100  CALCIUM 8.1* 7.0*    CBC  Recent Labs Lab 05/10/15 0002 05/10/15 0017 05/10/15 1100  WBC 5.2  --  8.5  HGB 16.2 17.3* 14.7  HCT 45.1 51.0 42.0  PLT 133*  --  126*    Coag's  Recent Labs Lab 05/10/15 0002  INR 1.12    Sepsis Markers  Recent Labs Lab 05/10/15 0012  LATICACIDVEN 1.24    ABG  Recent Labs Lab 05/10/15 1247 05/10/15 1437  PHART 7.217* 7.299*  PCO2ART 57.8* 43.1  PO2ART 533.0* 163.0*    Liver Enzymes  Recent Labs Lab 05/10/15 0002  AST 40  ALT 47  ALKPHOS 66  BILITOT 0.6  ALBUMIN 3.6    Cardiac Enzymes No results for input(s): TROPONINI, PROBNP in the last 168 hours.  Glucose  Recent Labs Lab 05/10/15 0746 05/10/15 1000 05/10/15 1159  GLUCAP 155* 166* 132*    Imaging Portable Chest Xray  05/10/2015  CLINICAL DATA:  Acute respiratory failure with hypoxemia. Postop laparotomy. EXAM:  PORTABLE CHEST 1 VIEW COMPARISON:  05/10/2015 FINDINGS: Endotracheal tube tip difficult to identify in the trachea. The tip may be slightly high. Right subclavian central venous catheter tip in the proximal SVC. This is unchanged. No pneumothorax. NG tube enters stomach. Negative for heart failure or pneumonia. No pleural effusion. No change from earlier today. IMPRESSION: Endotracheal tube appears high in the trachea.  Lungs remain clear. Electronically Signed   By: Franchot Gallo M.D.   On: 05/10/2015 15:01   Dg Chest Port 1 View  05/10/2015  CLINICAL DATA:  Status post central line placement EXAM: PORTABLE CHEST 1 VIEW COMPARISON:  05/10/2015 FINDINGS: Cardiac shadow is again mildly  enlarged cardiac shadow nasogastric catheter is noted extending into the stomach. The endotracheal tube is seen approximately 5.5 cm above the carina. A the right subclavian central line is noted in the mid superior vena cava. No pneumothorax is seen. The lungs are clear. No bony abnormality is noted. IMPRESSION: Tubes and lines as described.  No acute abnormality seen. Electronically Signed   By: Inez Catalina M.D.   On: 05/10/2015 14:05   Dg Abd Acute W/chest  05/10/2015  CLINICAL DATA:  Severe abdominal pain.  Diarrhea and fever today. EXAM: DG ABDOMEN ACUTE W/ 1V CHEST COMPARISON:  None. FINDINGS: Normal heart size and pulmonary vascularity.  Lungs are clear. Diffuse free air in the upper abdomen. This suggests bowel perforation in the absence of recent surgery. Scattered gas in the colon. No small or large bowel distention. No radiopaque stones. Degenerative changes in the spine. IMPRESSION: Diffuse free air in the upper abdomen, worrisome for bowel perforation. No evidence of active pulmonary disease. No bowel distention. These results were called by telephone at the time of interpretation on 05/10/2015 at 12:58 am to Dr. Varney Biles , who verbally acknowledged these results. Electronically Signed   By: Lucienne Capers M.D.   On: 05/10/2015 00:58   Ct Cta Abd/pel W/cm &/or W/o Cm  05/10/2015  CLINICAL DATA:  Bloody diarrhea and weakness for 3 days. Sudden onset at 22:00 of midline sharp stabbing pain. EXAM: CTA ABDOMEN AND PELVIS wITHOUT AND WITH CONTRAST TECHNIQUE: Multidetector CT imaging of the abdomen and pelvis was performed using the standard protocol during bolus administration of intravenous contrast. Multiplanar reconstructed images and MIPs were obtained and reviewed to evaluate the vascular anatomy. CONTRAST:  100 mL Omnipaque 350 intravenous COMPARISON:  Radiographs 05/10/2015 FINDINGS: Lower chest:  No significant abnormality Hepatobiliary: Mild diffuse fatty infiltration of the liver  without focal lesion. No bile duct dilatation. Gallbladder is unremarkable. No portal venous air. Pancreas: Normal Spleen: Normal Adrenals/Urinary Tract: The adrenals and kidneys are normal in appearance. There is no urinary calculus evident. There is no hydronephrosis or ureteral dilatation. Collecting systems and ureters appear unremarkable. Stomach/Bowel: Stomach and small bowel appear unremarkable. There is mild mural edema of the ascending colon with adjacent fat stranding. No pneumatosis. No bowel obstruction. Large volume free intraperitoneal air consistent with perforated viscus. Vascular/Lymphatic: The abdominal aorta is normal in caliber. There is mild atherosclerotic calcification. There is no adenopathy in the abdomen or pelvis. 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 Reproductive: Unremarkable Other: Small volume ascites collected dependently. Musculoskeletal: No significant musculoskeletal abnormality. Moderately severe degenerative lumbar disc disease is present at L4-5. Review of the MIP images confirms the above findings. IMPRESSION: Large volume free intraperitoneal air consistent with perforated hollow viscus. There is mild mural edema and adjacent fat stranding in the ascending colon without discrete  mass. This could represent colitis or ischemia, but the location of the bowel perforation is not conclusive on this scan. These results were called by telephone at the time of interpretation on 05/10/2015 at 2:11 am to Dr. Varney Biles , who verbally acknowledged these results. Electronically Signed   By: Andreas Newport M.D.   On: 05/10/2015 02:16     STUDIES:  CT of the abdomen 3/20>>>Perf  CULTURES: Blood 3/20>>>  ANTIBIOTICS: Zosyn 3/20>>>  SIGNIFICANT EVENTS: 3/20>>>OR visit for perf.  LINES/TUBES: ETT 3/20>>>  DISCUSSION: 65 year old male with perf viscus s/p laparotomy for repair.  Unable to extubate post op.  Transferred to the ICU and  PCCM consulted.  ASSESSMENT / PLAN:  PULMONARY A: Post op VDRF. P:   - SBT to extubate now. - Titrate O2 for sat. - IS and flutter valve. - Watch for shallow breathing and atelectasis.  CARDIOVASCULAR A:  Transient hypotension. P:  - No anti-HTN. - Watch for pain induced hypertension - no history.  RENAL A:   No active issues. P:   - BMET in AM. - Hydrate. - Replace electrolytes as indicated.  GASTROINTESTINAL A:   S/P Laparotomy. P:   - NPO. - Diet when ok with surgery. - Protonix.  HEMATOLOGIC A:   No active issues. P:  - CBC in AM.  INFECTIOUS A:   Peritonitis. P:   - Zosyn. - F/U on blood cultures.  ENDOCRINE A:   No history of diabetes.   P:   - Monitor.  NEUROLOGIC A:   Sedate. P:   RASS goal: 0 - D/C sedation. - Extubate.   FAMILY  - Updates: patient updated bedside.  The patient is critically ill with multiple organ systems failure and requires high complexity decision making for assessment and support, frequent evaluation and titration of therapies, application of advanced monitoring technologies and extensive interpretation of multiple databases.   Critical Care Time devoted to patient care services described in this note is  35  Minutes. This time reflects time of care of this signee Dr Jennet Maduro. This critical care time does not reflect procedure time, or teaching time or supervisory time of PA/NP/Med student/Med Resident etc but could involve care discussion time.  Rush Farmer, M.D. [facility] Pulmonary/Critical Care Medicine. Pager: [phone]. After hours pager: [phone].  05/10/2015, 3:31 PM

## 2015-05-10 NOTE — ED Provider Notes (Signed)
CSN: [id number]     Arrival date & time 05/09/15  2336 History  By signing my name below, I, Gregory Lane, attest that this documentation has been prepared under the direction and in the presence of Varney Biles, MD. Electronically Signed: Irene Lane, ED Scribe. 05/10/2015. 1:13 AM.   Chief Complaint  Patient presents with  . Abdominal Pain  . Weakness  . Diarrhea  . Shortness of Breath   The history is provided by the patient. No language interpreter was used.  HPI Comments: [name] is a 65 y.o. Male with a hx of HTN, stroke, and MI who presents to the Emergency Department brought in by EMS from home complaining of generalized weakness and watery, mildly bloody diarrhea 4-5x a day onset 3 days ago. Pt reports associated 10/10 midline, sharp, sudden onset, constant, non-radiating abdominal pain that started this evening, nausea, dizziness, and SOB. Per EMS, pt would become diaphoretic when changing positions. He reports that his stomach is more firm and distended currently than it ever has been. Pt is seen at the [facility] for healthcare. Pt takes medication for HTN and hypercholesteremia; he reports that he also has nitroglycerin at home. Pt is a smoker, 1 ppd for 60 years. He denies hx of similar symptoms, hx of abdominal surgeries, hx of COPD, hx of kidney stones, hx of gallbladder problems, hx of cancer, hx of DM, chest pain, vomiting, or melena. Pt denies allergies to medications.   Past Medical History  Diagnosis Date  . Hypertension   . Stroke ([place])   . Myocardial infarction [facility])    Past Surgical History  Procedure Laterality Date  . Fracture surgery    . Eye surgery     History reviewed. No pertinent family history. Social History  Substance Use Topics  . Smoking status: Current Every Day Smoker  . Smokeless tobacco: None  . Alcohol Use: None    Review of Systems 10 Systems reviewed and all are negative for acute change except as noted in the HPI.  Allergies   Review of patient's allergies indicates no known allergies.  Home Medications   Prior to Admission medications   Not on File   BP 183/97 mmHg  Pulse 89  Temp(Src) 98.4 F (36.9 C) (Oral)  Resp 18  Ht '6\' 1"'$  (1.854 m)  Wt 235 lb (106.595 kg)  BMI 31.01 kg/m2  SpO2 94% Physical Exam  Constitutional: He is oriented to person, place, and time. He appears well-developed and well-nourished.  HENT:  Head: Normocephalic and atraumatic.  Eyes: EOM are normal. Pupils are equal, round, and reactive to light.  Neck: Normal range of motion. Neck supple.  Cardiovascular: Normal rate, regular rhythm and normal heart sounds.  Exam reveals no gallop and no friction rub.   No murmur heard. Pulmonary/Chest: Effort normal and breath sounds normal. He has no wheezes.  Lungs clear to ausculation  Abdominal: There is tenderness. There is rebound and guarding.  Abdomen is firm; diffuse tenderness with guarding and rebound tenderness  Musculoskeletal: Normal range of motion.  Neurological: He is alert and oriented to person, place, and time.  Skin: Skin is warm and dry.  Psychiatric: He has a normal mood and affect. His behavior is normal.  Nursing note and vitals reviewed.   ED Course  .Critical Care Performed by: Varney Biles Authorized by: Varney Biles Total critical care time: 42 minutes Critical care time was exclusive of separately billable procedures and treating other patients. Critical care was necessary to treat  or prevent imminent or life-threatening deterioration of the following conditions: PERFORATED VISCUS IN THE ABDOMEN. Critical care was time spent personally by me on the following activities: development of treatment plan with patient or surrogate, discussions with consultants, examination of patient, obtaining history from patient or surrogate, ordering and performing treatments and interventions, pulse oximetry, re-evaluation of patient's condition and review of old  charts. Subsequent provider of critical care: I assumed direction of critical care for this patient from another provider of my specialty.   (including critical care time) DIAGNOSTIC STUDIES: Oxygen Saturation is 96% on RA, normal by my interpretation.    COORDINATION OF CARE: 12:15 AM-Discussed treatment plan which includes labs with pt at bedside and pt agreed to plan.   Labs Review Labs Reviewed  LIPASE, BLOOD - Abnormal; Notable for the following:    Lipase 68 (*)    All other components within normal limits  COMPREHENSIVE METABOLIC PANEL - Abnormal; Notable for the following:    Sodium 133 (*)    Chloride 98 (*)    CO2 18 (*)    Glucose, Bld 134 (*)    Creatinine, Ser 1.33 (*)    Calcium 8.1 (*)    GFR calc non Af Amer 54 (*)    Anion gap 17 (*)    All other components within normal limits  CBC WITH DIFFERENTIAL/PLATELET - Abnormal; Notable for the following:    MCV 101.1 (*)    MCH 36.3 (*)    Platelets 133 (*)    All other components within normal limits  I-STAT CHEM 8, ED - Abnormal; Notable for the following:    Sodium 131 (*)    Chloride 99 (*)    BUN 22 (*)    Glucose, Bld 129 (*)    Calcium, Ion 0.91 (*)    Hemoglobin 17.3 (*)    All other components within normal limits  CULTURE, BLOOD (ROUTINE X 2)  CULTURE, BLOOD (ROUTINE X 2)  PROTIME-INR  URINALYSIS, ROUTINE W REFLEX MICROSCOPIC (NOT AT [facility])  I-STAT CG4 LACTIC ACID, ED  TYPE AND SCREEN  ABO/RH    Imaging Review Dg Abd Acute W/chest  05/10/2015  CLINICAL DATA:  Severe abdominal pain.  Diarrhea and fever today. EXAM: DG ABDOMEN ACUTE W/ 1V CHEST COMPARISON:  None. FINDINGS: Normal heart size and pulmonary vascularity.  Lungs are clear. Diffuse free air in the upper abdomen. This suggests bowel perforation in the absence of recent surgery. Scattered gas in the colon. No small or large bowel distention. No radiopaque stones. Degenerative changes in the spine. IMPRESSION: Diffuse free air in the upper  abdomen, worrisome for bowel perforation. No evidence of active pulmonary disease. No bowel distention. These results were called by telephone at the time of interpretation on 05/10/2015 at 12:58 am to Dr. Varney Biles , who verbally acknowledged these results. Electronically Signed   By: Lucienne Capers M.D.   On: 05/10/2015 00:58   I have personally reviewed and evaluated these images and lab results as part of my medical decision-making.   EKG Interpretation   Date/Time:  Monday May 10 2015 00:12:02 EDT Ventricular Rate:  81 PR Interval:  155 QRS Duration: 102 QT Interval:  376 QTC Calculation: 436 R Axis:   81 Text Interpretation:  Sinus rhythm Borderline right axis deviation No  acute changes No old tracing to compare Confirmed by Kathrynn Humble, MD, Thelma Comp  [phone]) on 05/10/2015 12:30:55 AM      MDM   Final diagnoses:  Perforated viscus  Peritonitis ([place])   I personally performed the services described in this documentation, which was scribed in my presence. The recorded information has been reviewed and is accurate.  Pt comes in with severe abd pain, sudden onset. He has significant medical hx. No abd surgeries. Abd exam reveals firm and distended abdomen with generalized tenderness with guarding and rebound tenderness  AAS ordered - + free air.  Initial impression is perforated viscus, possibly mesenteric ischemia even, given sig smoking hx -and so CT-angio ordered. Results show perforation, but no clear source per CT scan.  Pt will get zosyn, 2 liters of ivf. VSS and WNL - pain is still severe.  Pt made aware of the dx and gen surgery, Dr. [name] to see the patient.    Varney Biles, MD 05/10/15 [phone]

## 2015-05-10 NOTE — Progress Notes (Signed)
Dr. Ola Spurr and Dr.Moser to bedside to assess pt with low BP. Orders received to stop precedex and start phenylephrine 8mg/min--done by TKennith GainCRNA. Will cont to monitor pt.

## 2015-05-10 NOTE — Op Note (Signed)
05/10/2015  5:59 AM  PATIENT:  Gregory Lane  65 y.o. male  PRE-OPERATIVE DIAGNOSIS:  Perforated Viscous  POST-OPERATIVE DIAGNOSIS:  Perforated Duodenal Ulcer  PROCEDURE:  Procedure(s): EXPLORATORY LAPAROTOMY (N/A) Zeric Heal patch repair of duodenal ulcer  SURGEON:  Surgeon(s) and Role:    * Ralene Ok, MD - Primary  ANESTHESIA:   general  EBL:  Total I/O In: 1000 [I.V.:1000] Out: 500 [Urine:400; Blood:50]  BLOOD ADMINISTERED:none  DRAINS: (19Fr) Jackson-Pratt drain(s) with closed bulb suction in the subhepatic fossa, anterior to the [facility]   LOCAL MEDICATIONS USED:  NONE  SPECIMEN:  No Specimen  DISPOSITION OF SPECIMEN:  N/A  COUNTS:  YES  TOURNIQUET:  * No tourniquets in log *  DICTATION: .Dragon Dictation Indications for procedure: patient is a 65 year old male who presented to the OR secondary to abdominal pain. Patient was hooked up and was found to have perforated viscus. Patient was taken back emergently for exploratory laparotomy.  Details of the procedure: After the patient was consented he was taken back to the operating room and placed in the supine position with bilateral SCDs in place. Patient underwent general endotracheal anesthesia. Patient had a catheter placed. He was then prepped and draped in the usual sterile fashion. A timeout was called all facts verified.  At this time the upper midline incision was made with a #10 blade scalpel. Electrocautery was used to maintain hemostasis and dissection was taken down to the midline fascia. This was incised. It was evident there was a pneumoperitoneum secondary to perforated viscus. The peritoneum was incised bluntly. The fascia was then extended to the length of the skin incision. The large amount of thick bilious ascites within the abdomen. This was suctioned out from the right upper quadrant. A Balfour was used to retract the fascia. I was able to manipulate the stomach and retract that  caudad. There appeared to be no perforated ulcers within the stomach. Turn my attention to the first and second portion of the duodenum. There appeared to be a 0.5 cm punctate perforation in the anterior portion of   D2. At this time secondary to patient's body habitus a Bookwalter retractor was placed and used for retraction.   At this time there adequate retraction the ulcer was easily seen. There was large amount of bilious contents spilling from the perforation.  At this time 3-0 silk's were used to reapproximate the perforation 4 in Lembert fashion. The tails were left long. A tongue of omentum was then mobilized and placed over the repair site. These were tied down using the tails of the initial 3-0 silk's.  At this time proceeded to eviscerate the small bowel and run the small bowel from the ligament of Treitz distally to the terminal ileum. The small bowel was without any injury or perforation. The right colon, transverse colon, left colon were seen to be without pathology.  At this time we irrigated out the abdomen with a proximally 5 L of saline. At this time a 68 [place] Blake drain was then placed anterior to the repair site. This was brought out via a stab incision to the right upper quadrant. This was sutured to the skin using a 2-0 nylon. At this time the omentum was brought over the midline viscera. The fascia was reapproximated using #1 single stranded PDS in a standard running fashion. The skin was left open. This was irrigated out. This was packed with Kerlix. The wound was then dressed with ABD pads and tape. The  patient tolerated the procedure well. The patient was taken to the ICU intubated and in stable condition.   PLAN OF CARE: Admit to inpatient   PATIENT DISPOSITION:  ICU - extubated and stable.   Delay start of Pharmacological VTE agent (>24hrs) due to surgical blood loss or risk of bleeding: no

## 2015-05-10 NOTE — Progress Notes (Signed)
Receiving report from Virgina Organ RN at this time. BP 88/57. Dr.Fitzgerald notified. LR 500cc IVFB ordered. PTsedated on vent. Will give and cont to monitor.

## 2015-05-10 NOTE — Progress Notes (Signed)
Dr. Ermalene Postin inserted CL to R subclavian. Order to run 1L of LR to each line.

## 2015-05-10 NOTE — ED Notes (Signed)
Patient transported to CT 

## 2015-05-10 NOTE — Progress Notes (Signed)
RN placed a second call to Dr. Ninfa Linden asking MD to call CCM and place order for consult. RN provided number for CCM MD. Awaiting orders.

## 2015-05-10 NOTE — Anesthesia Procedure Notes (Addendum)
Procedure Name: Intubation Date/Time: 05/10/2015 4:40 AM Performed by: Valetta Fuller Pre-anesthesia Checklist: Emergency Drugs available, Patient identified, Suction available and Patient being monitored Patient Re-evaluated:Patient Re-evaluated prior to inductionOxygen Delivery Method: Circle system utilized Preoxygenation: Pre-oxygenation with 100% oxygen Intubation Type: IV induction, Rapid sequence and Cricoid Pressure applied Laryngoscope Size: Miller and 2 Grade View: Grade I Tube type: Subglottic suction tube Tube size: 7.5 mm Number of attempts: 1 Airway Equipment and Method: Stylet Placement Confirmation: ETT inserted through vocal cords under direct vision,  positive ETCO2 and breath sounds checked- equal and bilateral Secured at: 22 cm Tube secured with: Tape Dental Injury: Teeth and Oropharynx as per pre-operative assessment    Central Venous Catheter Insertion Performed by: anesthesiologist Patient location: PACU. Preanesthetic checklist: patient identified, IV checked, site marked, risks and benefits discussed, surgical consent, monitors and equipment checked, pre-op evaluation, timeout performed and anesthesia consent Position: Trendelenburg Lidocaine 1% used for infiltration Landmarks identified and Seldinger technique used Catheter size: 8 Fr Central line was placed.Double lumen Procedure performed without using ultrasound guided technique. Attempts: 1 Following insertion, dressing applied, line sutured and Biopatch. Post procedure assessment: blood return through all ports, free fluid flow and no air. Patient tolerated the procedure well with no immediate complications.

## 2015-05-10 NOTE — Procedures (Signed)
Extubation Procedure Note  Patient Details:   Name: Gregory Lane DOB: [birth year] MRN: [id number]   Airway Documentation:     Evaluation  O2 sats: stable throughout Complications: No apparent complications Patient did tolerate procedure well. Bilateral Breath Sounds: Clear Suctioning: Airway, Oral Yes   Pt extubated to 3L [place] per MD order. Pt stable throughout with no complications. Pt able to speak and has a weak cough post extubation. Pt has inspiratory wheeze, PRN Albuterol will be given. RT will continue to monitor.   Jesse Sans 05/10/2015, 4:12 PM

## 2015-05-10 NOTE — Progress Notes (Signed)
Patient's bp 128/71; HR 66; RR 20; sats 100%. Patient is vented but awake

## 2015-05-10 NOTE — Progress Notes (Signed)
RN placed call to Dr. Ninfa Linden to consult CCM and ask for adequate pain management. Per Dr. Ninfa Linden via telephone, RN received order to modify fentanly and versed gtt to adequately manage pain. Also received order to put CCM consult order in. Blood gas results read back to MD.

## 2015-05-10 NOTE — Progress Notes (Signed)
Utilization review completed. Sahalie Beth, RN, BSN. 

## 2015-05-10 NOTE — Progress Notes (Signed)
Pharmacy Antibiotic Note  FRANCESCO PROVENCAL is a 65 y.o. male admitted on 05/09/2015 with Intra-abdominal infection.  Pharmacy has been consulted for Zosyn dosing.  X-ray abd + for free air, in CT now  Plan: -Zosyn 3.375G IV q8h to be infused over 4 hours -F/U infectious work-up  Height: '6\' 1"'$  (185.4 cm) Weight: 235 lb (106.595 kg) IBW/kg (Calculated) : 79.9  Temp (24hrs), Avg:98.4 F (36.9 C), Min:98.4 F (36.9 C), Max:98.4 F (36.9 C)   Recent Labs Lab 05/10/15 0002 05/10/15 0012 05/10/15 0017  WBC 5.2  --   --   CREATININE 1.33*  --  1.20  LATICACIDVEN  --  1.24  --     Estimated Creatinine Clearance: 76.5 mL/min (by C-G formula based on Cr of 1.2).    No Known Allergies    Narda Bonds 05/10/2015 1:16 AM

## 2015-05-10 NOTE — Progress Notes (Signed)
Late entry for 1015: M Mcmillen CRNA attempted x2 to insert PIV to R hand but to no avail. Dr. Ermalene Postin notified, will insert central line.

## 2015-05-10 NOTE — Anesthesia Preprocedure Evaluation (Addendum)
Anesthesia Evaluation  Patient identified by MRN, date of birth, ID band Patient awake    Reviewed: Allergy & Precautions, H&P , NPO status , Patient's Chart, lab work & pertinent test results  Airway Mallampati: II  TM Distance: >3 FB Neck ROM: Full    Dental no notable dental hx. (+) Edentulous Upper, Partial Lower, Dental Advisory Given,    Pulmonary shortness of breath, with exertion and at rest, COPD, Current Smoker,    Pulmonary exam normal breath sounds clear to auscultation       Cardiovascular hypertension, + Past MI   Rhythm:Regular Rate:Normal     Neuro/Psych CVA, No Residual Symptoms negative psych ROS   GI/Hepatic negative GI ROS, Neg liver ROS, Perforated viscus. Distended abdomen.   Endo/Other  negative endocrine ROS  Renal/GU negative Renal ROS  negative genitourinary   Musculoskeletal   Abdominal   Peds  Hematology negative hematology ROS (+)   Anesthesia Other Findings   Reproductive/Obstetrics negative OB ROS                            Anesthesia Physical Anesthesia Plan  ASA: III and emergent  Anesthesia Plan: General   Post-op Pain Management:    Induction: Intravenous, Rapid sequence and Cricoid pressure planned  Airway Management Planned: Oral ETT  Additional Equipment:   Intra-op Plan:   Post-operative Plan: Possible Post-op intubation/ventilation  Informed Consent: I have reviewed the patients History and Physical, chart, labs and discussed the procedure including the risks, benefits and alternatives for the proposed anesthesia with the patient or authorized representative who has indicated his/her understanding and acceptance.   Dental advisory given  Plan Discussed with: Anesthesiologist, Surgeon and CRNA  Anesthesia Plan Comments:        Anesthesia Quick Evaluation

## 2015-05-10 NOTE — Transfer of Care (Signed)
Immediate Anesthesia Transfer of Care Note  Patient: Gregory Lane  Procedure(s) Performed: Procedure(s): EXPLORATORY LAPAROTOMY (N/A)  Patient Location: PACU  Anesthesia Type:General  Level of Consciousness: sedated and Patient remains intubated per anesthesia plan  Airway & Oxygen Therapy: Patient remains intubated per anesthesia plan and Patient placed on Ventilator (see vital sign flow sheet for setting)  Post-op Assessment: Report given to RN and Post -op Vital signs reviewed and stable  Post vital signs: Reviewed and stable  Last Vitals:  Filed Vitals:   05/10/15 0624 05/10/15 0625  BP:  154/66  Pulse:  76  Temp: 38 C   Resp:  14    Complications: No apparent anesthesia complications

## 2015-05-10 NOTE — ED Notes (Signed)
Pt to short stay 36/ OR at this time with RN

## 2015-05-11 ENCOUNTER — Encounter (HOSPITAL_COMMUNITY): Payer: Self-pay | Admitting: General Surgery

## 2015-05-11 DIAGNOSIS — R0600 Dyspnea, unspecified: Secondary | ICD-10-CM

## 2015-05-11 DIAGNOSIS — K265 Chronic or unspecified duodenal ulcer with perforation: Principal | ICD-10-CM

## 2015-05-11 DIAGNOSIS — J9811 Atelectasis: Secondary | ICD-10-CM

## 2015-05-11 LAB — CBC
HCT: 40.5 % (ref 39.0–52.0)
HEMOGLOBIN: 13.8 g/dL (ref 13.0–17.0)
MCH: 35.4 pg — AB (ref 26.0–34.0)
MCHC: 34.1 g/dL (ref 30.0–36.0)
MCV: 103.8 fL — AB (ref 78.0–100.0)
Platelets: 122 10*3/uL — ABNORMAL LOW (ref 150–400)
RBC: 3.9 MIL/uL — AB (ref 4.22–5.81)
RDW: 14.7 % (ref 11.5–15.5)
WBC: 7 10*3/uL (ref 4.0–10.5)

## 2015-05-11 LAB — BASIC METABOLIC PANEL
ANION GAP: 8 (ref 5–15)
BUN: 11 mg/dL (ref 6–20)
CO2: 23 mmol/L (ref 22–32)
Calcium: 7.4 mg/dL — ABNORMAL LOW (ref 8.9–10.3)
Chloride: 107 mmol/L (ref 101–111)
Creatinine, Ser: 0.83 mg/dL (ref 0.61–1.24)
GFR calc Af Amer: >60 mL/min (ref >60)
Glucose, Bld: 119 mg/dL — ABNORMAL HIGH (ref 65–99)
Potassium: 3.9 mmol/L (ref 3.5–5.1)
Sodium: 138 mmol/L (ref 135–145)

## 2015-05-11 LAB — URINE MICROSCOPIC-ADD ON

## 2015-05-11 LAB — PHOSPHORUS: PHOSPHORUS: 2.3 mg/dL — AB (ref 2.5–4.6)

## 2015-05-11 LAB — URINALYSIS, ROUTINE W REFLEX MICROSCOPIC
Bilirubin Urine: NEGATIVE
GLUCOSE, UA: NEGATIVE mg/dL
KETONES UR: NEGATIVE mg/dL
Nitrite: NEGATIVE
PH: 5.5 (ref 5.0–8.0)
Protein, ur: 100 mg/dL — AB
Specific Gravity, Urine: 1.02 (ref 1.005–1.030)

## 2015-05-11 LAB — MAGNESIUM: MAGNESIUM: 1.9 mg/dL (ref 1.7–2.4)

## 2015-05-11 MED ORDER — HYDROMORPHONE HCL 1 MG/ML IJ SOLN
0.5000 mg | INTRAMUSCULAR | Status: DC | PRN
  Administered 2015-05-11 – 2015-05-17 (×19): 0.5 mg via INTRAVENOUS
  Filled 2015-05-11 (×19): qty 1

## 2015-05-11 NOTE — Progress Notes (Signed)
1 Day Post-Op  Subjective: Comfortable Denies SOB  Objective: Vital signs in last 24 hours: Temp:  [98.2 F (36.8 C)-99.1 F (37.3 C)] 98.5 F (36.9 C) (03/21 0315) Pulse Rate:  [49-80] 80 (03/21 0700) Resp:  [10-23] 15 (03/21 0700) BP: (94-160)/(58-114) 109/88 mmHg (03/21 0700) SpO2:  [96 %-100 %] 98 % (03/21 0700) Arterial Line BP: (87-157)/(41-69) 107/52 mmHg (03/20 1515) FiO2 (%):  [40 %-100 %] 40 % (03/20 1535)    Intake/Output from previous day: 03/20 0701 - 03/21 0700 In: 1500 [I.V.:1200; IV Piggyback:300] Out: 1455 [Urine:1275; Drains:180] Intake/Output this shift:    Awake and alert Lungs clear CV RRR Abdomen obese, appropriately tender  Lab Results:   Recent Labs  05/10/15 1100 05/11/15 0300  WBC 8.5 7.0  HGB 14.7 13.8  HCT 42.0 40.5  PLT 126* 122*   BMET  Recent Labs  05/10/15 1100 05/11/15 0300  NA 135 138  K 4.3 3.9  CL 107 107  CO2 20* 23  GLUCOSE 177* 119*  BUN 19 11  CREATININE 1.21 0.83  CALCIUM 7.0* 7.4*   PT/INR  Recent Labs  05/10/15 0002  LABPROT 14.6  INR 1.12   ABG  Recent Labs  05/10/15 1247 05/10/15 1437  PHART 7.217* 7.299*  HCO3 23.5 21.2    Studies/Results: Portable Chest Xray  05/10/2015  CLINICAL DATA:  Acute respiratory failure with hypoxemia. Postop laparotomy. EXAM: PORTABLE CHEST 1 VIEW COMPARISON:  05/10/2015 FINDINGS: Endotracheal tube tip difficult to identify in the trachea. The tip may be slightly high. Right subclavian central venous catheter tip in the proximal SVC. This is unchanged. No pneumothorax. NG tube enters stomach. Negative for heart failure or pneumonia. No pleural effusion. No change from earlier today. IMPRESSION: Endotracheal tube appears high in the trachea.  Lungs remain clear. Electronically Signed   By: Franchot Gallo M.D.   On: 05/10/2015 15:01   Dg Chest Port 1 View  05/10/2015  CLINICAL DATA:  Status post central line placement EXAM: PORTABLE CHEST 1 VIEW COMPARISON:   05/10/2015 FINDINGS: Cardiac shadow is again mildly enlarged cardiac shadow nasogastric catheter is noted extending into the stomach. The endotracheal tube is seen approximately 5.5 cm above the carina. A the right subclavian central line is noted in the mid superior vena cava. No pneumothorax is seen. The lungs are clear. No bony abnormality is noted. IMPRESSION: Tubes and lines as described.  No acute abnormality seen. Electronically Signed   By: Inez Catalina M.D.   On: 05/10/2015 14:05   Dg Abd Acute W/chest  05/10/2015  CLINICAL DATA:  Severe abdominal pain.  Diarrhea and fever today. EXAM: DG ABDOMEN ACUTE W/ 1V CHEST COMPARISON:  None. FINDINGS: Normal heart size and pulmonary vascularity.  Lungs are clear. Diffuse free air in the upper abdomen. This suggests bowel perforation in the absence of recent surgery. Scattered gas in the colon. No small or large bowel distention. No radiopaque stones. Degenerative changes in the spine. IMPRESSION: Diffuse free air in the upper abdomen, worrisome for bowel perforation. No evidence of active pulmonary disease. No bowel distention. These results were called by telephone at the time of interpretation on 05/10/2015 at 12:58 am to Dr. Varney Biles , who verbally acknowledged these results. Electronically Signed   By: Lucienne Capers M.D.   On: 05/10/2015 00:58   Ct Cta Abd/pel W/cm &/or W/o Cm  05/10/2015  CLINICAL DATA:  Bloody diarrhea and weakness for 3 days. Sudden onset at 22:00 of midline sharp stabbing pain. EXAM:  CTA ABDOMEN AND PELVIS wITHOUT AND WITH CONTRAST TECHNIQUE: Multidetector CT imaging of the abdomen and pelvis was performed using the standard protocol during bolus administration of intravenous contrast. Multiplanar reconstructed images and MIPs were obtained and reviewed to evaluate the vascular anatomy. CONTRAST:  100 mL Omnipaque 350 intravenous COMPARISON:  Radiographs 05/10/2015 FINDINGS: Lower chest:  No significant abnormality  Hepatobiliary: Mild diffuse fatty infiltration of the liver without focal lesion. No bile duct dilatation. Gallbladder is unremarkable. No portal venous air. Pancreas: Normal Spleen: Normal Adrenals/Urinary Tract: The adrenals and kidneys are normal in appearance. There is no urinary calculus evident. There is no hydronephrosis or ureteral dilatation. Collecting systems and ureters appear unremarkable. Stomach/Bowel: Stomach and small bowel appear unremarkable. There is mild mural edema of the ascending colon with adjacent fat stranding. No pneumatosis. No bowel obstruction. Large volume free intraperitoneal air consistent with perforated viscus. Vascular/Lymphatic: The abdominal aorta is normal in caliber. There is mild atherosclerotic calcification. There is no adenopathy in the abdomen or pelvis. 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 Reproductive: Unremarkable Other: Small volume ascites collected dependently. Musculoskeletal: No significant musculoskeletal abnormality. Moderately severe degenerative lumbar disc disease is present at L4-5. Review of the MIP images confirms the above findings. IMPRESSION: Large volume free intraperitoneal air consistent with perforated hollow viscus. There is mild mural edema and adjacent fat stranding in the ascending colon without discrete mass. This could represent colitis or ischemia, but the location of the bowel perforation is not conclusive on this scan. These results were called by telephone at the time of interpretation on 05/10/2015 at 2:11 am to Dr. Varney Biles , who verbally acknowledged these results. Electronically Signed   By: Andreas Newport M.D.   On: 05/10/2015 02:16    Anti-infectives: Anti-infectives    Start     Dose/Rate Route Frequency Ordered Stop   05/10/15 1300  fluconazole (DIFLUCAN) IVPB 400 mg     400 mg 100 mL/hr over 120 Minutes Intravenous Every 24 hours 05/10/15 1219     05/10/15 1000   piperacillin-tazobactam (ZOSYN) IVPB 3.375 g     3.375 g 12.5 mL/hr over 240 Minutes Intravenous 3 times per day 05/10/15 0116     05/10/15 0130  piperacillin-tazobactam (ZOSYN) IVPB 3.375 g     3.375 g 12.5 mL/hr over 240 Minutes Intravenous  Once 05/10/15 0116 05/10/15 0403      Assessment/Plan: s/p Procedure(s): EXPLORATORY LAPAROTOMY (N/A)  perforated DU  Transfer to floor Continue NPO and NG   LOS: 1 day    Gregory Lane A 05/11/2015

## 2015-05-11 NOTE — Anesthesia Postprocedure Evaluation (Signed)
Anesthesia Post Note  Patient: Gregory Lane  Procedure(s) Performed: Procedure(s) (LRB): EXPLORATORY LAPAROTOMY (N/A)  Patient location during evaluation: PACU Anesthesia Type: General Level of consciousness: awake and alert Pain management: pain level controlled Vital Signs Assessment: post-procedure vital signs reviewed and stable Respiratory status: spontaneous breathing, nonlabored ventilation, respiratory function stable and patient connected to nasal cannula oxygen Cardiovascular status: blood pressure returned to baseline and stable Postop Assessment: no signs of nausea or vomiting Anesthetic complications: no    Last Vitals:  Filed Vitals:   05/11/15 1149 05/11/15 1200  BP:  165/76  Pulse:  76  Temp: 37.6 C   Resp:  11    Last Pain:  Filed Vitals:   05/11/15 1250  PainSc: Asleep                 Oneal Biglow,W. EDMOND

## 2015-05-11 NOTE — Progress Notes (Signed)
[facility] PCCM  S: Extubated yesterday, breathing comfortably; says he has baseline mucus production and dyspnea  O: Filed Vitals:   05/11/15 0800 05/11/15 0809 05/11/15 0900 05/11/15 1000  BP: 149/77  105/94 112/87  Pulse: 87  74 80  Temp:  98.8 F (37.1 C)    TempSrc:  Oral    Resp: '24  14 19  '$ Height:      Weight:      SpO2: 98%  99% 98%   2L [place]  Gen: obese, chronically ill appearing HENT: NCAT, OP clear, NG tube PULM: few rhonchi, normal effort CV: RRR, no mgr, trace edema GI: infrequent bowel sounds, soft, dressings in place Derm: no cyanosis or rash Psyche: normal mood and affect  CXR> no infiltrate  Impression/Plan Perforated ulcer> post op care per surgery Shortness of breath, chronic> needs outpatient PFT; acutely in hospital needs incentive spirometry, get out of bed; I educated him on splinting today  PCCM will sign off, call if questions  Roselie Awkward, MD [place] [facility] Pager: [phone] Cell: [phone] After 3pm or if no response, call [phone]

## 2015-05-11 NOTE — Evaluation (Signed)
Physical Therapy Evaluation Patient Details Name: Gregory Lane MRN: [id number] DOB: [birth year] Today's Date: 05/11/2015   History of Present Illness  Patient is a 65 y/o male with hx of CVA, MI and HTN s/p EXPLORATORY LAPAROTOMY.  Clinical Impression  Patient presents with pain, decreased balance, endurance, strength and mobility s/p above surgery. PTA, pt independent at home but having difficulty performing ADLs and IADLs. Tolerated gait training with min guard assist and RW for support with multiple rest breaks due to fatigue and SOB. Pt does not have support at home. Encouraged mobility as much as tolerated in hospital to improve function. Will follow acutely to maximize independence and mobility prior to return home.    Follow Up Recommendations Home health PT;Supervision for mobility/OOB    Equipment Recommendations  Rolling walker with 5" wheels;3in1 (PT)    Recommendations for Other Services OT consult     Precautions / Restrictions Precautions Precautions: Fall Precaution Comments: NG tube Restrictions Weight Bearing Restrictions: No      Mobility  Bed Mobility Overal bed mobility: Needs Assistance Bed Mobility: Supine to Sit     Supine to sit: Min assist;HOB elevated     General bed mobility comments: Assist to elevate trunk. Use of rail. + dizziness.   Transfers Overall transfer level: Needs assistance Equipment used: Rolling walker (2 wheeled) Transfers: Sit to/from Stand Sit to Stand: Min assist         General transfer comment: Min A to boost from EOB with cues for hand placement/technique. Stood from Youth worker.   Ambulation/Gait Ambulation/Gait assistance: Min guard Ambulation Distance (Feet): 100 Feet (x2 bouts) Assistive device: Rolling walker (2 wheeled) Gait Pattern/deviations: Step-through pattern;Decreased stride length;Wide base of support;Trunk flexed   Gait velocity interpretation: Below normal speed for age/gender General Gait  Details: Slow, steady gait with cues for RW management. A few short standing rest breaks and 1 seated rest break due to SOB, fatigue. Sp02 89-94% on RA.  Stairs            Wheelchair Mobility    Modified Rankin (Stroke Patients Only)       Balance Overall balance assessment: Needs assistance Sitting-balance support: Feet supported;No upper extremity supported Sitting balance-Leahy Scale: Fair     Standing balance support: During functional activity;Bilateral upper extremity supported Standing balance-Leahy Scale: Poor Standing balance comment: Reliant on BUEs for support.                             Pertinent Vitals/Pain Pain Assessment: Faces Faces Pain Scale: Hurts even more Pain Location: abdomen with movement Pain Descriptors / Indicators: Sore Pain Intervention(s): Monitored during session;Repositioned    Home Living Family/patient expects to be discharged to:: Private residence Living Arrangements: Alone   Type of Home: Apartment Home Access: Level entry     Home Layout: One level Home Equipment: None      Prior Function Level of Independence: Independent         Comments: Reports difficulty with ADLs- only able to walk short distances. Gets tired quickly. Sedendary lifestyle. gets meals at church a few days per week.     Hand Dominance        Extremity/Trunk Assessment   Upper Extremity Assessment: Defer to OT evaluation           Lower Extremity Assessment: Generalized weakness         Communication   Communication: No difficulties (Mumbling speech at times. )  Cognition Arousal/Alertness: Awake/alert Behavior During Therapy: WFL for tasks assessed/performed Overall Cognitive Status: Within Functional Limits for tasks assessed                      General Comments      Exercises        Assessment/Plan    PT Assessment Patient needs continued PT services  PT Diagnosis Difficulty walking;Acute pain    PT Problem List Decreased strength;Pain;Cardiopulmonary status limiting activity;Decreased balance;Decreased mobility;Decreased safety awareness;Decreased activity tolerance;Decreased knowledge of use of DME  PT Treatment Interventions Therapeutic exercise;Gait training;Functional mobility training;Therapeutic activities;Patient/family education;Balance training;DME instruction   PT Goals (Current goals can be found in the Care Plan section) Acute Rehab PT Goals Patient Stated Goal: to return home PT Goal Formulation: With patient Time For Goal Achievement: 05/25/15 Potential to Achieve Goals: Good    Frequency Min 3X/week   Barriers to discharge Decreased caregiver support Lives alone    Co-evaluation               End of Session Equipment Utilized During Treatment: Gait belt Activity Tolerance: Patient tolerated treatment well Patient left: in chair;with call bell/phone within reach;with nursing/sitter in room Nurse Communication: Mobility status         Time: 0174-9449 PT Time Calculation (min) (ACUTE ONLY): 36 min   Charges:   PT Evaluation $PT Eval Moderate Complexity: 1 Procedure PT Treatments $Gait Training: 8-22 mins   PT G Codes:        Kordel Leavy A Perle Gibbon 05/11/2015, 1:59 PM Wray Kearns, [place], DPT [phone]

## 2015-05-11 NOTE — Progress Notes (Signed)
eLink Physician-Brief Progress Note Patient Name: Gregory Lane DOB: October 06, 1950 MRN: [id number]   Date of Service  05/11/2015  HPI/Events of Note  RN notified of ongoing pain from abdominal incision. Patient extubated today. Has required intermittent Dilaudid for pain control.Patient currently off fentanyl infusion. Awake and reportedly mentating well.  eICU Interventions  1. dC Versed & fentanyl 2. Dilaudid 0.5 mg IV every 3 hours when necessary severe pain     Intervention Category Intermediate Interventions: Pain - evaluation and management  Tera Partridge 05/11/2015, 2:23 AM

## 2015-05-11 NOTE — Clinical Documentation Improvement (Signed)
General Surgery Critical Care  Abnormal Lab/Test Results:  Serum Na+ was 133, 131 in ED  Please document findings in next progress note. Thank you!  Possible Clinical Conditions associated with below indicators  Hyponatremia  Hypernatremia  Other Condition  Cannot Clinically Determine  Supporting Information:  Treatment Provided:  Treated with 0.9% Na Cl; switched to Ringers Lactate and then to D5 0.9% NaCl at 100 cc/hr  Repeat Na draws were: 135, 138  Please exercise your independent, professional judgment when responding. A specific answer is not anticipated or expected.  Thank You,  Zoila Shutter RN, BSN, [place] [phone]; Cell: [phone]

## 2015-05-12 ENCOUNTER — Encounter (HOSPITAL_COMMUNITY): Payer: Self-pay | Admitting: General Practice

## 2015-05-12 HISTORY — PX: EXPLORATORY LAPAROTOMY: SUR591

## 2015-05-12 LAB — BASIC METABOLIC PANEL
Anion gap: 5 (ref 5–15)
BUN: 6 mg/dL (ref 6–20)
CALCIUM: 7.9 mg/dL — AB (ref 8.9–10.3)
CO2: 27 mmol/L (ref 22–32)
Chloride: 104 mmol/L (ref 101–111)
Creatinine, Ser: 0.79 mg/dL (ref 0.61–1.24)
GFR calc Af Amer: >60 mL/min (ref >60)
GLUCOSE: 104 mg/dL — AB (ref 65–99)
Potassium: 3.6 mmol/L (ref 3.5–5.1)
Sodium: 136 mmol/L (ref 135–145)

## 2015-05-12 LAB — CBC
HCT: 40.6 % (ref 39.0–52.0)
Hemoglobin: 13.3 g/dL (ref 13.0–17.0)
MCH: 34.4 pg — AB (ref 26.0–34.0)
MCHC: 32.8 g/dL (ref 30.0–36.0)
MCV: 104.9 fL — AB (ref 78.0–100.0)
PLATELETS: 122 10*3/uL — AB (ref 150–400)
RBC: 3.87 MIL/uL — ABNORMAL LOW (ref 4.22–5.81)
RDW: 14.8 % (ref 11.5–15.5)
WBC: 6.7 10*3/uL (ref 4.0–10.5)

## 2015-05-12 MED ORDER — PNEUMOCOCCAL VAC POLYVALENT 25 MCG/0.5ML IJ INJ
0.5000 mL | INJECTION | INTRAMUSCULAR | Status: AC
  Administered 2015-05-15: 0.5 mL via INTRAMUSCULAR

## 2015-05-12 NOTE — Progress Notes (Signed)
OT Cancellation Note  Patient Details Name: Gregory Lane MRN: [id number] DOB: 05-03-1950   Cancelled Treatment:    Reason Eval/Treat Not Completed: Patient declined, no reason specified. Educated pt on OT role and importance of mobilizing as much as possible, but pt continued to refuse to participate at this time. Had brief discussion with pt about d/c plans and advised him to think carefully about going home without any assistance and if he would be able to complete all ADL/IADLs safely - pt agreed that he probably would not be able to. Will continue discussion tomorrow and complete OT evaluation.  Redmond Baseman, OTR/L Pager: [phone] 05/12/2015, 5:36 PM

## 2015-05-12 NOTE — Progress Notes (Signed)
Patient ID: Gregory Lane, male   DOB: [birth year], 65 y.o.   MRN: [id number] 2 Days Post-Op  Subjective: Up in chair, no flatus, hungry  Objective: Vital signs in last 24 hours: Temp:  [98.4 F (36.9 C)-99.6 F (37.6 C)] 98.4 F (36.9 C) (03/22 0536) Pulse Rate:  [73-84] 73 (03/22 0536) Resp:  [11-20] 20 (03/22 0536) BP: (134-169)/(62-110) 159/71 mmHg (03/22 0536) SpO2:  [91 %-98 %] 97 % (03/22 0536)    Intake/Output from previous day: 03/21 0701 - 03/22 0700 In: 2400 [I.V.:2300; IV Piggyback:100] Out: 1395 [Urine:875; Emesis/NG output:300; Drains:220] Intake/Output this shift:    General appearance: cooperative Resp: mild wheeze GI: soft, quiet, midline wound dressing changed - clean  Lab Results: CBC   Recent Labs  05/11/15 0300 05/12/15 0450  WBC 7.0 6.7  HGB 13.8 13.3  HCT 40.5 40.6  PLT 122* 122*   BMET  Recent Labs  05/11/15 0300 05/12/15 0450  NA 138 136  K 3.9 3.6  CL 107 104  CO2 23 27  GLUCOSE 119* 104*  BUN 11 6  CREATININE 0.83 0.79  CALCIUM 7.4* 7.9*   PT/INR  Recent Labs  05/10/15 0002  LABPROT 14.6  INR 1.12   ABG  Recent Labs  05/10/15 1247 05/10/15 1437  PHART 7.217* 7.299*  HCO3 23.5 21.2    Studies/Results: Portable Chest Xray  05/10/2015  CLINICAL DATA:  Acute respiratory failure with hypoxemia. Postop laparotomy. EXAM: PORTABLE CHEST 1 VIEW COMPARISON:  05/10/2015 FINDINGS: Endotracheal tube tip difficult to identify in the trachea. The tip may be slightly high. Right subclavian central venous catheter tip in the proximal SVC. This is unchanged. No pneumothorax. NG tube enters stomach. Negative for heart failure or pneumonia. No pleural effusion. No change from earlier today. IMPRESSION: Endotracheal tube appears high in the trachea.  Lungs remain clear. Electronically Signed   By: Franchot Gallo M.D.   On: 05/10/2015 15:01   Dg Chest Port 1 View  05/10/2015  CLINICAL DATA:  Status post central line placement EXAM:  PORTABLE CHEST 1 VIEW COMPARISON:  05/10/2015 FINDINGS: Cardiac shadow is again mildly enlarged cardiac shadow nasogastric catheter is noted extending into the stomach. The endotracheal tube is seen approximately 5.5 cm above the carina. A the right subclavian central line is noted in the mid superior vena cava. No pneumothorax is seen. The lungs are clear. No bony abnormality is noted. IMPRESSION: Tubes and lines as described.  No acute abnormality seen. Electronically Signed   By: Inez Catalina M.D.   On: 05/10/2015 14:05    Anti-infectives: Anti-infectives    Start     Dose/Rate Route Frequency Ordered Stop   05/10/15 1300  fluconazole (DIFLUCAN) IVPB 400 mg  Status:  Discontinued     400 mg 100 mL/hr over 120 Minutes Intravenous Every 24 hours 05/10/15 1219 05/11/15 1131   05/10/15 1000  piperacillin-tazobactam (ZOSYN) IVPB 3.375 g     3.375 g 12.5 mL/hr over 240 Minutes Intravenous 3 times per day 05/10/15 0116     05/10/15 0130  piperacillin-tazobactam (ZOSYN) IVPB 3.375 g     3.375 g 12.5 mL/hr over 240 Minutes Intravenous  Once 05/10/15 0116 05/10/15 0403      Assessment/Plan: S/P repair perforated DU 3/20 [name]) Await return of bowel function VTE - Lovenox Resp failure - improving slowly, CCM plans outpatient PFTs PT/OT   LOS: 2 days    Georganna Skeans, MD, MPH, FACS Trauma: [phone] General Surgery: [phone]  05/12/2015

## 2015-05-12 NOTE — Progress Notes (Signed)
Physical Therapy Treatment Patient Details Name: BEAUX WEDEMEYER MRN: [id number] DOB: 05/25/1950 Today's Date: 05/12/2015    History of Present Illness Patient is a 65 y/o male with hx of CVA, MI and HTN s/p EXPLORATORY LAPAROTOMY.    PT Comments    Patient sitting up in recliner upon arrival. Very apprehensive about walking without a chair follow. He was able to stand with no physical assistance but required increased effort. Limited ambulation this session due to fatigue. Patient stated that he didn't sleep well and hasn't been able to eat therefore feels as if he has no energy. Concerned about patients ability to care for himself, however, he is refusing SNF because he has to care for his dog and cat. Patient stated that he does live on the first level of his small apartment but he does not have any assistance.   Follow Up Recommendations  Home health PT;Supervision for mobility/OOB     Equipment Recommendations  Rolling walker with 5" wheels;3in1 (PT)    Recommendations for Other Services       Precautions / Restrictions Precautions Precautions: Fall Precaution Comments: NG tube    Mobility  Bed Mobility               General bed mobility comments: Patient up in chair upon arrival   Transfers Overall transfer level: Needs assistance Equipment used: Rolling walker (2 wheeled)   Sit to Stand: Min guard         General transfer comment: Patient able to use armrest and momentum to stand without physical assist. Increased effort and fatigue noted  Ambulation/Gait Ambulation/Gait assistance: Min guard Ambulation Distance (Feet): 80 Feet Assistive device: Rolling walker (2 wheeled) Gait Pattern/deviations: Step-through pattern;Decreased stride length;Trunk flexed;Wide base of support   Gait velocity interpretation: Below normal speed for age/gender General Gait Details: Continues with slow and steady gait requiring cues to stand fully upright. Patient stated that  he needed to turn around due to fatigue. Patient very worried about not having a chair behind him.    Stairs            Wheelchair Mobility    Modified Rankin (Stroke Patients Only)       Balance                                    Cognition Arousal/Alertness: Awake/alert Behavior During Therapy: WFL for tasks assessed/performed Overall Cognitive Status: Within Functional Limits for tasks assessed                      Exercises      General Comments        Pertinent Vitals/Pain Faces Pain Scale: Hurts even more Pain Location: "all over" Pain Descriptors / Indicators: Sore Pain Intervention(s): Monitored during session;Limited activity within patient's tolerance    Home Living                      Prior Function            PT Goals (current goals can now be found in the care plan section) Progress towards PT goals: Progressing toward goals (slowly)    Frequency  Min 3X/week    PT Plan Current plan remains appropriate    Co-evaluation             End of Session   Activity Tolerance: Patient limited by fatigue Patient  left: in chair;with call bell/phone within reach     Time: 1111-1129 PT Time Calculation (min) (ACUTE ONLY): 18 min  Charges:  $Gait Training: 8-22 mins                    G Codes:      Jacqualyn Posey 05/12/2015, 11:43 AM 05/12/2015 Adarius Tigges, Tonia Brooms PTA

## 2015-05-13 MED ORDER — HYDRALAZINE HCL 20 MG/ML IJ SOLN
10.0000 mg | Freq: Four times a day (QID) | INTRAMUSCULAR | Status: DC | PRN
  Administered 2015-05-13: 10 mg via INTRAVENOUS
  Filled 2015-05-13: qty 1

## 2015-05-13 MED ORDER — HYDRALAZINE HCL 20 MG/ML IJ SOLN
10.0000 mg | Freq: Once | INTRAMUSCULAR | Status: AC
  Administered 2015-05-13: 10 mg via INTRAVENOUS
  Filled 2015-05-13: qty 1

## 2015-05-13 MED ORDER — METOPROLOL TARTRATE 1 MG/ML IV SOLN
5.0000 mg | Freq: Once | INTRAVENOUS | Status: AC
  Administered 2015-05-13: 5 mg via INTRAVENOUS
  Filled 2015-05-13: qty 5

## 2015-05-13 MED ORDER — HYDRALAZINE HCL 20 MG/ML IJ SOLN
10.0000 mg | Freq: Four times a day (QID) | INTRAMUSCULAR | Status: DC
  Administered 2015-05-13 – 2015-05-16 (×13): 10 mg via INTRAVENOUS
  Administered 2015-05-16: 5 mg via INTRAVENOUS
  Administered 2015-05-17 – 2015-05-18 (×7): 10 mg via INTRAVENOUS
  Filled 2015-05-13 (×21): qty 1

## 2015-05-13 NOTE — Progress Notes (Signed)
Patient ID: Gregory Lane, male   DOB: Jul 29, 1950, 65 y.o.   MRN: [id number]     [address] [place], [place] [address]    Phone: [phone] FAX: [phone]     Subjective: No n/v.  1342m NGT output.  Passing flatus as of yesterday. Voiding after foley removed. Afebrile.  BP up. Takes antihypertensives, doesn't know the pharmacy, through the [place] [facility] somewhere on [address], not in our records. Sitting up in a chair, ambulating.   Objective:  Vital signs:  Filed Vitals:   05/12/15 0536 05/12/15 1420 05/12/15 2128 05/13/15 0531  BP: 159/71 177/72 181/78 179/74  Pulse: 73 74 73 67  Temp: 98.4 F (36.9 C) 97 F (36.1 C) 98.6 F (37 C) 98.2 F (36.8 C)  TempSrc: Oral Oral Oral   Resp: 20 20 19 20   Height:      Weight:      SpO2: 97% 94% 98% 95%       Intake/Output   Yesterday:  03/22 0701 - 03/23 0700 In: 2420 [P.O.:120; I.V.:2300] Out: 2835 [Urine:1375; Emesis/NG output:1400; Drains:60] This shift:     Physical Exam: General: Pt awake/alert/oriented x4 in no acute distress Chest: cta. No chest wall pain w good excursion CV:  Pulses intact.  Regular rhythm MS: Normal AROM mjr joints.  No obvious deformity Abdomen: Soft.  Nondistended.  Incision c/d/i. Drain with serosanguinous output.   No evidence of peritonitis.  No incarcerated hernias. Ext:  SCDs BLE.  No mjr edema.  No cyanosis Skin: No petechiae / purpura   Problem List:   Active Problems:   Perforated duodenal ulcer ([place]    Results:   Labs: Results for orders placed or performed during the hospital encounter of 05/09/15 (from the past 48 hour(s))  Urinalysis, Routine w reflex microscopic (not at [facility]     Status: Abnormal   Collection Time: 05/11/15  1:55 PM  Result Value Ref Range   Color, Urine RED (A) YELLOW    Comment: BIOCHEMICALS MAY BE AFFECTED BY COLOR   APPearance CLOUDY (A) CLEAR   Specific Gravity, Urine  1.020 1.005 - 1.030   pH 5.5 5.0 - 8.0   Glucose, UA NEGATIVE NEGATIVE mg/dL   Hgb urine dipstick LARGE (A) NEGATIVE   Bilirubin Urine NEGATIVE NEGATIVE   Ketones, ur NEGATIVE NEGATIVE mg/dL   Protein, ur 100 (A) NEGATIVE mg/dL   Nitrite NEGATIVE NEGATIVE   Leukocytes, UA TRACE (A) NEGATIVE  Urine microscopic-add on     Status: Abnormal   Collection Time: 05/11/15  1:55 PM  Result Value Ref Range   Squamous Epithelial / LPF 0-5 (A) NONE SEEN   WBC, UA 0-5 0 - 5 WBC/hpf   RBC / HPF TOO NUMEROUS TO COUNT 0 - 5 RBC/hpf   Bacteria, UA FEW (A) NONE SEEN   Urine-Other MUCOUS PRESENT   CBC     Status: Abnormal   Collection Time: 05/12/15  4:50 AM  Result Value Ref Range   WBC 6.7 4.0 - 10.5 K/uL   RBC 3.87 (L) 4.22 - 5.81 MIL/uL   Hemoglobin 13.3 13.0 - 17.0 g/dL   HCT 40.6 39.0 - 52.0 %   MCV 104.9 (H) 78.0 - 100.0 fL   MCH 34.4 (H) 26.0 - 34.0 pg   MCHC 32.8 30.0 - 36.0 g/dL   RDW 14.8 11.5 - 15.5 %   Platelets 122 (L) 150 - 400 K/uL  Basic metabolic panel     Status: Abnormal   Collection Time: 05/12/15  4:50 AM  Result Value Ref Range   Sodium 136 135 - 145 mmol/L   Potassium 3.6 3.5 - 5.1 mmol/L   Chloride 104 101 - 111 mmol/L   CO2 27 22 - 32 mmol/L   Glucose, Bld 104 (H) 65 - 99 mg/dL   BUN 6 6 - 20 mg/dL   Creatinine, Ser 0.79 0.61 - 1.24 mg/dL   Calcium 7.9 (L) 8.9 - 10.3 mg/dL   GFR calc non Af Amer >60 >60 mL/min   GFR calc Af Amer >60 >60 mL/min    Comment: (NOTE) The eGFR has been calculated using the CKD EPI equation. This calculation has not been validated in all clinical situations. eGFR's persistently <60 mL/min signify possible Chronic Kidney Disease.    Anion gap 5 5 - 15    Imaging / Studies: No results found.  Medications / Allergies:  Scheduled Meds: . enoxaparin (LOVENOX) injection  40 mg Subcutaneous Q24H  . pantoprazole (PROTONIX) IV  40 mg Intravenous Daily  . piperacillin-tazobactam (ZOSYN)  IV  3.375 g Intravenous 3 times per day  .  pneumococcal 23 valent vaccine  0.5 mL Intramuscular Tomorrow-1000   Continuous Infusions: . dextrose 5 % and 0.9% NaCl 100 mL/hr at 05/12/15 1310  . lactated ringers     PRN Meds:.albuterol, hydrALAZINE, HYDROmorphone (DILAUDID) injection  Antibiotics: Anti-infectives    Start     Dose/Rate Route Frequency Ordered Stop   05/10/15 1300  fluconazole (DIFLUCAN) IVPB 400 mg  Status:  Discontinued     400 mg 100 mL/hr over 120 Minutes Intravenous Every 24 hours 05/10/15 1219 05/11/15 1131   05/10/15 1000  piperacillin-tazobactam (ZOSYN) IVPB 3.375 g     3.375 g 12.5 mL/hr over 240 Minutes Intravenous 3 times per day 05/10/15 0116     05/10/15 0130  piperacillin-tazobactam (ZOSYN) IVPB 3.375 g     3.375 g 12.5 mL/hr over 240 Minutes Intravenous  Once 05/10/15 0116 05/10/15 0403        Assessment/Plan POD#3 laparotomy, repair of perforated duodenal ulcer 3/20 Dr. [name] -plan for UGI in AM to rule out a leak.  NGT and bowel rest for now -mobilize, IS, BID wet to dry dressing changes -drain care -? Check h pylori  ID-zosyn D#3.   HTN-appreciate pharmacy help with reconciling home meds.  Hydralazine for now COPD-aggressive pulmonary toilet VTE prophylaxis-SCD/lovenox Dispo-UGI in AM   Erby Pian, [facility] Pager [phone](7A-4:30P) For consults and floor pages call [phone](7A-4:30P)  05/13/2015 8:10 AM

## 2015-05-13 NOTE — Progress Notes (Signed)
Pharmacy Antibiotic Note  Gregory Lane is a 65 y.o. male admitted on 05/09/2015 with Intra-abdominal infection.  Pharmacy managing Zosyn. CrCl remains stable > 100 mL/min. Cultures remain negative so far    Plan: -Zosyn 3.375G IV q8h to be infused over 4 hours -UGI planned for tomorrow  -Pharmacy to sign off since no further dose adjustments anticipated   Height: '6\' 1"'$  (185.4 cm) Weight: 235 lb (106.595 kg) IBW/kg (Calculated) : 79.9  Temp (24hrs), Avg:98 F (36.7 C), Min:97 F (36.1 C), Max:98.6 F (37 C)   Recent Labs Lab 05/10/15 0002 05/10/15 0012 05/10/15 0017 05/10/15 1100 05/11/15 0300 05/12/15 0450  WBC 5.2  --   --  8.5 7.0 6.7  CREATININE 1.33*  --  1.20 1.21 0.83 0.79  LATICACIDVEN  --  1.24  --   --   --   --     Estimated Creatinine Clearance: 114.8 mL/min (by C-G formula based on Cr of 0.79).    No Known Allergies    Albertina Parr, PharmD., BCPS Clinical Pharmacist Pager [phone]

## 2015-05-13 NOTE — Care Management Note (Signed)
Case Management Note  Patient Details  Name: Gregory Lane MRN: [id number] Date of Birth: 1950/04/09  Subjective/Objective:                    Action/Plan:  Await HHPT orders and RN needs  Expected Discharge Date:                  Expected Discharge Plan:  [place]  In-House Referral:     Discharge planning Services  CM Consult  Post Acute Care Choice:    Choice offered to:     DME Arranged:    DME Agency:     HH Arranged:    HH Agency:     Status of Service:  In process, will continue to follow  Medicare Important Message Given:    Date Medicare IM Given:    Medicare IM give by:    Date Additional Medicare IM Given:    Additional Medicare Important Message give by:     If discussed at [place] of Stay Meetings, dates discussed:    Additional Comments: Ur updated  Marilu Favre, RN 05/13/2015, 1:56 PM

## 2015-05-13 NOTE — Progress Notes (Signed)
NP on call made aware of patient's blood pressure of 195/90. No new orders at this time. Will continue to monitor and wait for orders.

## 2015-05-13 NOTE — Progress Notes (Signed)
Occupational Therapy Evaluation Patient Details Name: Gregory Lane MRN: [id number] DOB: [birth year] Today's Date: 05/13/2015    History of Present Illness Patient is a 65 y/o male with hx of CVA, MI and HTN s/p EXPLORATORY LAPAROTOMY.   Clinical Impression   PTA, pt was independent with ADLs and mobility, but noticed he was progressively having less energy to complete daily tasks. Pt completed ADLs and mobility with min-mod assist for balance impairments and fatigue. Pt's SaO2 remained between 93-95% on RA at rest and during activity. Pt plans to d/c home with no assistance. Pt will benefit from continued acute OT to increase independence and safety with ADLs and mobility to allow for safe discharge home. Recommending HHOT and 31in and RW-2 wheeled for home use.     Follow Up Recommendations  Home health OT;Supervision/Assistance - 24 hour    Equipment Recommendations  3 in 1 bedside comode;Other (comment) (RW- 2 wheeled)    Recommendations for Other Services       Precautions / Restrictions Precautions Precautions: Fall Precaution Comments: NG tube Restrictions Weight Bearing Restrictions: No      Mobility Bed Mobility               General bed mobility comments: Pt up in chair on OT arrival. Pt reports he sleeps in the recliner  Transfers Overall transfer level: Needs assistance Equipment used: Rolling walker (2 wheeled) Transfers: Sit to/from Stand Sit to Stand: Min guard         General transfer comment: Min guard for safety. Pt had LOB x2 while standing at sink and required mod assist to regain balance. Verbal cues for safe body positioning and hand placement on seated surfaces.    Balance Overall balance assessment: Needs assistance Sitting-balance support: No upper extremity supported;Feet supported Sitting balance-Leahy Scale: Fair Sitting balance - Comments: due to abdominal pain   Standing balance support: Bilateral upper extremity  supported;During functional activity Standing balance-Leahy Scale: Poor Standing balance comment: LOB x2 posteriorly. Mod assist to regain balance.                            ADL Overall ADL's : Needs assistance/impaired     Grooming: Wash/dry hands;Min guard;Standing           Upper Body Dressing : Minimal assistance;Sitting Upper Body Dressing Details (indicate cue type and reason): due to fatigue Lower Body Dressing: Minimal assistance;Sit to/from stand Lower Body Dressing Details (indicate cue type and reason): due to fatrigue and inability to reach LB, can cross ankle-over-knee with some abdominal discomfort Toilet Transfer: Min guard;Ambulation;BSC;RW;Cueing for safety Toilet Transfer Details (indicate cue type and reason): Cues to feel BSC on back of legs before reaching back to sit Toileting- Clothing Manipulation and Hygiene: Min guard;Sit to/from stand       Functional mobility during ADLs: Minimal assistance;Rolling walker       Vision Vision Assessment?: Yes Eye Alignment: Within Functional Limits Ocular Range of Motion: Within Functional Limits Alignment/Gaze Preference: Within Defined Limits Tracking/Visual Pursuits: Able to track stimulus in all quads without difficulty Saccades: Within functional limits Convergence: Within functional limits Additional Comments: Bilateral nystagmus noted with horizontal visual pursuits   Perception     Praxis      Pertinent Vitals/Pain Pain Assessment: Faces Faces Pain Scale: Hurts little more Pain Location: abdomen  Pain Descriptors / Indicators: Sore;Discomfort Pain Intervention(s): Limited activity within patient's tolerance;Monitored during session;Repositioned     Hand Dominance Right  Extremity/Trunk Assessment Upper Extremity Assessment Upper Extremity Assessment: Overall WFL for tasks assessed (overall 4/5 strength)   Lower Extremity Assessment Lower Extremity Assessment: Generalized  weakness   Cervical / Trunk Assessment Cervical / Trunk Assessment: Normal   Communication Communication Communication: No difficulties   Cognition Arousal/Alertness: Awake/alert Behavior During Therapy: Flat affect Overall Cognitive Status: Within Functional Limits for tasks assessed                     General Comments       Exercises       Shoulder Instructions      Home Living Family/patient expects to be discharged to:: Private residence Living Arrangements: Alone Available Help at Discharge: Family;Available PRN/intermittently Type of Home: Apartment Home Access: Level entry     Home Layout: One level     Bathroom Shower/Tub: Tub/shower unit Shower/tub characteristics: Architectural technologist: Standard Bathroom Accessibility: No   Home Equipment: None          Prior Functioning/Environment Level of Independence: Independent        Comments: Reports lack of energy and difficulty with ADLs and IADLs for the past 2 months. Pt uses food [employer] at [employer] to obtain food items and his meals typically consist of soups and sandwiches, but he reports he has less and less energy to prepare food.    OT Diagnosis: Generalized weakness;Acute pain   OT Problem List: Decreased activity tolerance;Decreased strength;Decreased range of motion;Impaired balance (sitting and/or standing);Decreased coordination;Decreased safety awareness;Decreased knowledge of use of DME or AE;Decreased knowledge of precautions;Obesity;Pain   OT Treatment/Interventions: Self-care/ADL training;Therapeutic exercise;Energy conservation;DME and/or AE instruction;Therapeutic activities;Patient/family education;Balance training    OT Goals(Current goals can be found in the care plan section) Acute Rehab OT Goals Patient Stated Goal: to return home OT Goal Formulation: With patient Time For Goal Achievement: 05/27/15 Potential to Achieve Goals: Good ADL Goals Pt Will Perform Lower Body  Bathing: with modified independence;with adaptive equipment;sitting/lateral leans;sit to/from stand Pt Will Perform Lower Body Dressing: with modified independence;with adaptive equipment;sitting/lateral leans;sit to/from stand Pt Will Transfer to Toilet: with modified independence;ambulating;bedside commode (with BSC over toilet) Pt Will Perform Toileting - Clothing Manipulation and hygiene: with modified independence;sit to/from stand Additional ADL Goal #1: Pt will demonstrate 3 energy conservation strategies to increase safety with ADLs.   OT Frequency: Min 3X/week   Barriers to D/C: Decreased caregiver support  Lives alone with no family to assist       Co-evaluation              End of Session Equipment Utilized During Treatment: Gait belt;Rolling walker;Oxygen Nurse Communication: Mobility status  Activity Tolerance: Patient limited by fatigue Patient left: in chair;with call bell/phone within reach   Time: 1531-1608 OT Time Calculation (min): 37 min Charges:  OT General Charges $OT Visit: 1 Procedure OT Evaluation $OT Eval Moderate Complexity: 1 Procedure OT Treatments $Self Care/Home Management : 8-22 mins G-Codes:    Redmond Baseman, OTR/L Pager: [phone] 05/13/2015, 4:44 PM

## 2015-05-13 NOTE — Progress Notes (Signed)
PT Cancellation Note  Patient Details Name: Gregory Lane MRN: [id number] DOB: [birth year]   Cancelled Treatment:    Reason Eval/Treat Not Completed: Fatigue/lethargy limiting ability to participate;Medical issues which prohibited therapy;Patient declined, no reason specified. Patient with elevated SBP of 188 and HR 103 at rest. Patient reports feeling tired.  Politely declined PT at this time.  Will return at a later date for PT session.   Despina Pole 05/13/2015, 2:36 PM Carita Pian. Sanjuana Kava, [place] Pager [phone]

## 2015-05-14 ENCOUNTER — Inpatient Hospital Stay (HOSPITAL_COMMUNITY): Payer: Medicare HMO

## 2015-05-14 MED ORDER — WHITE PETROLATUM GEL
Status: AC
Start: 2015-05-14 — End: 2015-05-14
  Administered 2015-05-14: 08:00:00
  Filled 2015-05-14: qty 1

## 2015-05-14 MED ORDER — IOHEXOL 300 MG/ML  SOLN
60.0000 mL | Freq: Once | INTRAMUSCULAR | Status: AC | PRN
  Administered 2015-05-14: 60 mL via ORAL

## 2015-05-14 MED ORDER — AMLODIPINE BESYLATE 5 MG PO TABS
5.0000 mg | ORAL_TABLET | Freq: Every day | ORAL | Status: DC
  Administered 2015-05-14 – 2015-05-16 (×3): 5 mg via ORAL
  Filled 2015-05-14 (×3): qty 1

## 2015-05-14 NOTE — Progress Notes (Signed)
Physical Therapy Treatment Patient Details Name: MAKYA YURKO MRN: [id number] DOB: 29-Aug-1950 Today's Date: 05/14/2015    History of Present Illness Patient is a 65 y/o male with hx of CVA, MI and HTN s/p EXPLORATORY LAPAROTOMY.    PT Comments    Patient getting washed up upon entering room but stood to work on standing balance and endurance. Patient able to increase ambulation this session but still very diconditioned and fatigues quickly. Upon sitting up in recliner patient had BM and required A to stand and provide pericare. Encouraged ambulation with nursing staff.   Follow Up Recommendations  Home health PT;Supervision for mobility/OOB     Equipment Recommendations  Rolling walker with 5" wheels;3in1 (PT)    Recommendations for Other Services       Precautions / Restrictions Precautions Precautions: Fall Precaution Comments: NG tube Restrictions Weight Bearing Restrictions: No    Mobility  Bed Mobility Overal bed mobility: Needs Assistance Bed Mobility: Supine to Sit     Supine to sit: Min assist;HOB elevated        Transfers Overall transfer level: Needs assistance Equipment used: Rolling walker (2 wheeled) Transfers: Sit to/from Stand Sit to Stand: Min guard         General transfer comment: MG for safety and cues to push up from bed and not pull from RW  Ambulation/Gait Ambulation/Gait assistance: Min guard Ambulation Distance (Feet): 200 Feet Assistive device: Rolling walker (2 wheeled) Gait Pattern/deviations: Step-through pattern;Decreased stride length;Wide base of support   Gait velocity interpretation: Below normal speed for age/gender General Gait Details: Cues for upright posture and breathing. Chair to follow to increase distance   Financial trader Rankin (Stroke Patients Only)       Balance                                    Cognition Arousal/Alertness:  Awake/alert Behavior During Therapy: WFL for tasks assessed/performed Overall Cognitive Status: Within Functional Limits for tasks assessed                      Exercises      General Comments        Pertinent Vitals/Pain Faces Pain Scale: Hurts even more Pain Location: abdomen Pain Descriptors / Indicators: Sore Pain Intervention(s): Limited activity within patient's tolerance    Home Living                      Prior Function            PT Goals (current goals can now be found in the care plan section) Progress towards PT goals: Progressing toward goals    Frequency  Min 3X/week    PT Plan Current plan remains appropriate    Co-evaluation             End of Session   Activity Tolerance: Patient tolerated treatment well Patient left: in chair;with call bell/phone within reach     Time: 1021-1045 PT Time Calculation (min) (ACUTE ONLY): 24 min  Charges:  $Gait Training: 8-22 mins $Therapeutic Activity: 8-22 mins                    G Codes:      Jacqualyn Posey 05/14/2015, 11:11 AM  05/14/2015 Kaislyn Gulas, Tonia Brooms PTA

## 2015-05-14 NOTE — Care Management Important Message (Signed)
Important Message  Patient Details  Name: Gregory Lane MRN: [id number] Date of Birth: 1950-07-24   Medicare Important Message Given:  Yes    Barb Merino Brit Wernette 05/14/2015, 12:56 PM

## 2015-05-14 NOTE — Progress Notes (Signed)
4 Days Post-Op  Subjective: Complains of incisional pain Otherwise ok  Objective: Vital signs in last 24 hours: Temp:  [97.9 F (36.6 C)-98.1 F (36.7 C)] 98 F (36.7 C) (03/24 0406) Pulse Rate:  [66-71] 66 (03/24 0406) Resp:  [14-19] 19 (03/24 0406) BP: (171-195)/(72-83) 171/83 mmHg (03/24 0406) SpO2:  [92 %-96 %] 96 % (03/24 0406) Last BM Date:  (Prior to surgery)  Intake/Output from previous day: 03/23 0701 - 03/24 0700 In: 1164 [I.V.:1164] Out: 2030 [Urine:1100; Emesis/NG output:800; Drains:130] Intake/Output this shift:    Lungs clear Abdomen soft, wound clean, drain serosang  Lab Results:   Recent Labs  05/12/15 0450  WBC 6.7  HGB 13.3  HCT 40.6  PLT 122*   BMET  Recent Labs  05/12/15 0450  NA 136  K 3.6  CL 104  CO2 27  GLUCOSE 104*  BUN 6  CREATININE 0.79  CALCIUM 7.9*   PT/INR No results for input(s): LABPROT, INR in the last 72 hours. ABG No results for input(s): PHART, HCO3 in the last 72 hours.  Invalid input(s): PCO2, PO2  Studies/Results: No results found.  Anti-infectives: Anti-infectives    Start     Dose/Rate Route Frequency Ordered Stop   05/10/15 1300  fluconazole (DIFLUCAN) IVPB 400 mg  Status:  Discontinued     400 mg 100 mL/hr over 120 Minutes Intravenous Every 24 hours 05/10/15 1219 05/11/15 1131   05/10/15 1000  piperacillin-tazobactam (ZOSYN) IVPB 3.375 g     3.375 g 12.5 mL/hr over 240 Minutes Intravenous 3 times per day 05/10/15 0116     05/10/15 0130  piperacillin-tazobactam (ZOSYN) IVPB 3.375 g     3.375 g 12.5 mL/hr over 240 Minutes Intravenous  Once 05/10/15 0116 05/10/15 0403      Assessment/Plan: s/p Procedure(s): EXPLORATORY LAPAROTOMY (N/A)  For UGI today to assess ulcer repair.  If no leak, will d/c NG and start clear liquids Continue wound care  LOS: 4 days    Desmin Daleo A 05/14/2015

## 2015-05-14 NOTE — Progress Notes (Signed)
Occupational Therapy Treatment Patient Details Name: Gregory Lane MRN: [id number] DOB: March 08, 1950 Today's Date: 05/14/2015    History of present illness Patient is a 65 y/o male with hx of CVA, MI and HTN s/p EXPLORATORY LAPAROTOMY.   OT comments  Pt cued for safety due to urgency.  He didn't really like AE, although reacher or leg lifter may be helpful, depending upon progress.  Pt is very motivated but fatiques easily  Follow Up Recommendations  Home health OT;Supervision/Assistance - 24 hour    Equipment Recommendations  3 in 1 bedside comode    Recommendations for Other Services      Precautions / Restrictions Precautions Precautions: Fall Precaution Comments: NG tube Restrictions Weight Bearing Restrictions: No       Mobility Bed Mobility               General bed mobility comments: oob  Transfers   Equipment used: Rolling walker (2 wheeled) Transfers: Sit to/from Stand Sit to Stand: Min guard         General transfer comment: for safety; cues for UE placement when getting up from commode    Balance                                   ADL       Grooming: Wash/dry hands;Min guard;Standing               Lower Body Dressing: Minimal assistance;Sit to/from stand;With adaptive equipment   Toilet Transfer: Min guard;Ambulation;BSC;RW;Cueing for safety   Toileting- Clothing Manipulation and Hygiene: Minimal assistance;Sit to/from stand         General ADL Comments: pt fatiques easily. Educated on AE but pt had some difficulty with it.  Reacher might be helpful to start pants.  He can hold to pants and cross legs for clothing.  Leg lifter would be another possibility.  Pt walked to sink, had urgency and needed safety cueing due to rushing.  He needed min A for thoroughness with hygiene      Vision                     Perception     Praxis      Cognition   Behavior During Therapy: WFL for tasks  assessed/performed Overall Cognitive Status: Within Functional Limits for tasks assessed                       Extremity/Trunk Assessment               Exercises     Shoulder Instructions       General Comments      Pertinent Vitals/ Pain       Faces Pain Scale: Hurts little more Pain Location: abdomen Pain Descriptors / Indicators: Sore Pain Intervention(s): Limited activity within patient's tolerance;Monitored during session;Repositioned  Home Living                                          Prior Functioning/Environment              Frequency       Progress Toward Goals  OT Goals(current goals can now be found in the care plan section)  Progress towards OT goals: Progressing toward goals     Plan  remains appropriate   Co-evaluation                 End of Session     Activity Tolerance Patient limited by fatigue   Patient Left in chair;with call bell/phone within reach   Nurse Communication          Time: 2023-3435 OT Time Calculation (min): 28 min  Charges: OT General Charges $OT Visit: 1 Procedure OT Treatments $Self Care/Home Management : 23-37 mins  Dreamer Carillo 05/14/2015, 4:33 PM  Lesle Chris, OTR/L [phone] 05/14/2015

## 2015-05-14 NOTE — Progress Notes (Signed)
PT Cancellation Note  Patient Details Name: Gregory Lane MRN: [id number] DOB: 12-Jan-1951   Cancelled Treatment:    Reason Eval/Treat Not Completed: Patient at procedure or test/unavailable   Kaislyn Gulas, Tonia Brooms 05/14/2015, 9:39 AM

## 2015-05-15 DIAGNOSIS — F17210 Nicotine dependence, cigarettes, uncomplicated: Secondary | ICD-10-CM | POA: Diagnosis not present

## 2015-05-15 DIAGNOSIS — I252 Old myocardial infarction: Secondary | ICD-10-CM | POA: Diagnosis not present

## 2015-05-15 DIAGNOSIS — E669 Obesity, unspecified: Secondary | ICD-10-CM | POA: Diagnosis not present

## 2015-05-15 DIAGNOSIS — R188 Other ascites: Secondary | ICD-10-CM | POA: Diagnosis not present

## 2015-05-15 DIAGNOSIS — K659 Peritonitis, unspecified: Secondary | ICD-10-CM | POA: Diagnosis not present

## 2015-05-15 DIAGNOSIS — K265 Chronic or unspecified duodenal ulcer with perforation: Secondary | ICD-10-CM | POA: Diagnosis not present

## 2015-05-15 DIAGNOSIS — J95821 Acute postprocedural respiratory failure: Secondary | ICD-10-CM | POA: Diagnosis not present

## 2015-05-15 DIAGNOSIS — Z6831 Body mass index (BMI) 31.0-31.9, adult: Secondary | ICD-10-CM | POA: Diagnosis not present

## 2015-05-15 DIAGNOSIS — I10 Essential (primary) hypertension: Secondary | ICD-10-CM | POA: Diagnosis not present

## 2015-05-15 LAB — CULTURE, BLOOD (ROUTINE X 2)
CULTURE: NO GROWTH
Culture: NO GROWTH

## 2015-05-15 MED ORDER — ZOLPIDEM TARTRATE 5 MG PO TABS
5.0000 mg | ORAL_TABLET | Freq: Every evening | ORAL | Status: DC | PRN
  Administered 2015-05-15 – 2015-05-17 (×3): 5 mg via ORAL
  Filled 2015-05-15 (×3): qty 1

## 2015-05-15 MED ORDER — SODIUM CHLORIDE 0.9% FLUSH
10.0000 mL | INTRAVENOUS | Status: DC | PRN
  Administered 2015-05-15: 10 mL
  Filled 2015-05-15: qty 40

## 2015-05-15 MED ORDER — SODIUM CHLORIDE 0.9% FLUSH
10.0000 mL | Freq: Two times a day (BID) | INTRAVENOUS | Status: DC
  Administered 2015-05-15: 10 mL

## 2015-05-15 MED ORDER — SODIUM CHLORIDE 0.9 % IV SOLN
80.0000 mg | Freq: Every day | INTRAVENOUS | Status: DC
  Administered 2015-05-15: 80 mg via INTRAVENOUS
  Filled 2015-05-15 (×2): qty 80

## 2015-05-15 NOTE — Progress Notes (Signed)
Gave pt a PRN albuterol neb treatment for shortness of breath and some wheezing noted.  O2 remains at 3 L N/c.

## 2015-05-15 NOTE — Progress Notes (Signed)
Ngt check  Every 6 hrs with no residual noted and no c/o nausea overnight.

## 2015-05-15 NOTE — Progress Notes (Signed)
5 Days Post-Op  Subjective:  Upper GI yesterday showed no leak or obstruction. Tolerating clear liquids. Tolerating NG clamping with minimal residuals Passing stool and flatus Ambulating with PT and walker Continues wet-to-dry dressings to abdominal wound twice a day. Continues on IV Zosyn and IV Protonix.  Requested sleeping pill and I ordered Ambien for bedtime as needed  Objective: Vital signs in last 24 hours: Temp:  [97.5 F (36.4 C)-98.4 F (36.9 C)] 98.4 F (36.9 C) (03/25 0542) Pulse Rate:  [71-79] 79 (03/25 0542) Resp:  [19] 19 (03/25 0542) BP: (171-198)/(77-84) 171/77 mmHg (03/25 0542) SpO2:  [95 %-98 %] 98 % (03/25 0542) Last BM Date: 05/14/15  Intake/Output from previous day: 03/24 0701 - 03/25 0700 In: 1720 [P.O.:720; I.V.:950; IV Piggyback:50] Out: 930 [Urine:800; Drains:130] Intake/Output this shift:    General appearance: Friendly.  Cooperative.  No distress.  Talking nonstop. Resp: clear to auscultation bilaterally GI: Obese.  Soft.  Minimally tender.  Midline wound packed open with clean healthy tissue beginning to granulate.  JP drainage serosanguineous.  No odor.  Nothing enteric.  Lab Results:  No results found for this or any previous visit (from the past 24 hour(s)).   Studies/Results: Dg Ugi W/water Sol Cm  05/14/2015  CLINICAL DATA:  65 year old who is postop day 4 after repair of perforated duodenal ulcer. EXAM: WATER SOLUBLE UPPER GI SERIES TECHNIQUE: Single-column upper GI series was performed using water soluble contrast. CONTRAST:  65m OMNIPAQUE IOHEXOL 300 MG/ML via nasogastric tube. COMPARISON:  Preoperative CT abdomen and pelvis 05/10/2015. FLUOROSCOPY TIME:  Radiation Exposure Index (as provided by the fluoroscopic device): 403.35 microGy meter2 (dose area product) FINDINGS: Preliminary scout AP supine abdominal image demonstrates a nasogastric tube with its tip at the expected location of the gastric antrum or duodenal bulb. A  Jackson-Pratt surgical drain is in place. Upper abdominal bowel gas pattern unremarkable. 60 ml of water-soluble contrast were administered via the nasogastric tube. There is no evidence of contrast leak. There is mild fold thickening involving the descending and transverse duodenum. The visualized proximal jejunum is normal in appearance. The stomach is incompletely distended with contrast but is unremarkable. IMPRESSION: 1. No evidence of contrast leak after repair of duodenal ulcer. 2. Mild fold thickening involving the descending and transverse duodenum indicating mild duodenitis. Electronically Signed   By: TEvangeline DakinM.D.   On: 05/14/2015 09:42    . amLODipine  5 mg Oral Daily  . enoxaparin (LOVENOX) injection  40 mg Subcutaneous Q24H  . hydrALAZINE  10 mg Intravenous Q6H  . pantoprazole (PROTONIX) IV  80 mg Intravenous Daily  . piperacillin-tazobactam (ZOSYN)  IV  3.375 g Intravenous 3 times per day  . pneumococcal 23 valent vaccine  0.5 mL Intramuscular Tomorrow-1000     Assessment/Plan: s/p Procedure(s): EXPLORATORY LAPAROTOMY  POD #5.  Graham patch closure of perforated duodenal ulcer Satisfactory progress with no leak of water-soluble upper GI Discontinue NG Full liquids Ambulate as much as possible with walker and PT  Hypertension.  On Norvasc and when necessary Apresoline. History stroke History myocardial infarction  The VTE prophylaxis--on Lovenox    '@PROBHOSP'$ @  LOS: 5 days    Jc Veron M 05/15/2015  . .prob

## 2015-05-16 MED ORDER — PANTOPRAZOLE SODIUM 40 MG PO TBEC
40.0000 mg | DELAYED_RELEASE_TABLET | Freq: Two times a day (BID) | ORAL | Status: DC
  Administered 2015-05-16 – 2015-05-18 (×5): 40 mg via ORAL
  Filled 2015-05-16 (×5): qty 1

## 2015-05-16 MED ORDER — HYDROCODONE-ACETAMINOPHEN 10-325 MG PO TABS
1.0000 | ORAL_TABLET | ORAL | Status: DC | PRN
  Administered 2015-05-16 – 2015-05-17 (×2): 1 via ORAL
  Filled 2015-05-16 (×2): qty 1

## 2015-05-16 MED ORDER — ONDANSETRON HCL 4 MG/2ML IJ SOLN
4.0000 mg | Freq: Four times a day (QID) | INTRAMUSCULAR | Status: DC | PRN
  Administered 2015-05-16: 4 mg via INTRAVENOUS
  Filled 2015-05-16: qty 2

## 2015-05-16 NOTE — Progress Notes (Signed)
6 Days Post-Op  Subjective: Spirits are good.  No complaints.  Feels like he is improving. Tolerating full liquids without nausea.  Had a bowel movement and passing flatus. Concerned about wound care at home. I told him we would likely remove the drain before discharge. We will get wound and ostomy nurse to evaluate for vac. We'll arrange for home health nursing, PT, OT. Possible discharge tomorrow  Objective: Vital signs in last 24 hours: Temp:  [97.8 F (36.6 C)-98.2 F (36.8 C)] 97.8 F (36.6 C) (03/26 0532) Pulse Rate:  [74-80] 76 (03/26 0532) Resp:  [18-20] 18 (03/26 0532) BP: (165-182)/(75-82) 165/79 mmHg (03/26 0532) SpO2:  [96 %-97 %] 97 % (03/26 0532) Last BM Date: 05/15/15  Intake/Output from previous day: 03/25 0701 - 03/26 0700 In: 3144.5 [P.O.:1700; I.V.:1194.5; IV Piggyback:250] Out: 2640 [Urine:2500; Drains:140] Intake/Output this shift:    General appearance: Alert and cooperative.  No distress.  Mental status normal Resp: clear to auscultation bilaterally GI: Soft.  Minimally tender.  Not distended.  Midline wound packed open.  Fascia intact.  subcut. tissue clean.  JP drainage is serous and benign looking.  Lab Results:  No results found for this or any previous visit (from the past 24 hour(s)).   Studies/Results: No results found.  Marland Kitchen amLODipine  5 mg Oral Daily  . enoxaparin (LOVENOX) injection  40 mg Subcutaneous Q24H  . hydrALAZINE  10 mg Intravenous Q6H  . pantoprazole (PROTONIX) IV  80 mg Intravenous Daily  . piperacillin-tazobactam (ZOSYN)  IV  3.375 g Intravenous 3 times per day  . sodium chloride flush  10-40 mL Intracatheter Q12H     Assessment/Plan: s/p Procedure(s): EXPLORATORY LAPAROTOMY   POD #6. Graham patch closure of perforated duodenal ulcer Satisfactory progress with no leak of water-soluble upper GI Soft diet Ambulate as much as possible with walker and PT Possible discharge in 24-48 hours Case management consult  requested for HHN, HHPT, [place] [facility] consult requested for possible negative pressure dressing  Hypertension.Reasonable control.  IV rate cutback.  On Norvasc and when necessary Apresoline. History stroke History myocardial infarction  The VTE prophylaxis--on Lovenox  '@PROBHOSP'$ @  LOS: 6 days    Gregory Lane 05/16/2015  . .prob

## 2015-05-17 ENCOUNTER — Inpatient Hospital Stay (HOSPITAL_COMMUNITY): Payer: Medicare HMO

## 2015-05-17 ENCOUNTER — Encounter (HOSPITAL_COMMUNITY): Payer: Self-pay | Admitting: Physician Assistant

## 2015-05-17 DIAGNOSIS — Z9119 Patient's noncompliance with other medical treatment and regimen: Secondary | ICD-10-CM

## 2015-05-17 DIAGNOSIS — F17200 Nicotine dependence, unspecified, uncomplicated: Secondary | ICD-10-CM

## 2015-05-17 DIAGNOSIS — J449 Chronic obstructive pulmonary disease, unspecified: Secondary | ICD-10-CM

## 2015-05-17 DIAGNOSIS — Z91199 Patient's noncompliance with other medical treatment and regimen due to unspecified reason: Secondary | ICD-10-CM | POA: Insufficient documentation

## 2015-05-17 DIAGNOSIS — I10 Essential (primary) hypertension: Secondary | ICD-10-CM

## 2015-05-17 DIAGNOSIS — Z720 Tobacco use: Secondary | ICD-10-CM

## 2015-05-17 DIAGNOSIS — F1010 Alcohol abuse, uncomplicated: Secondary | ICD-10-CM

## 2015-05-17 LAB — COMPREHENSIVE METABOLIC PANEL
ALBUMIN: 2.5 g/dL — AB (ref 3.5–5.0)
ALK PHOS: 55 U/L (ref 38–126)
ALT: 61 U/L (ref 17–63)
ANION GAP: 9 (ref 5–15)
AST: 36 U/L (ref 15–41)
BUN: 11 mg/dL (ref 6–20)
CALCIUM: 8.1 mg/dL — AB (ref 8.9–10.3)
CHLORIDE: 103 mmol/L (ref 101–111)
CO2: 27 mmol/L (ref 22–32)
Creatinine, Ser: 0.72 mg/dL (ref 0.61–1.24)
GFR calc Af Amer: >60 mL/min (ref >60)
GFR calc non Af Amer: >60 mL/min (ref >60)
GLUCOSE: 123 mg/dL — AB (ref 65–99)
POTASSIUM: 2.9 mmol/L — AB (ref 3.5–5.1)
SODIUM: 139 mmol/L (ref 135–145)
Total Bilirubin: 0.6 mg/dL (ref 0.3–1.2)
Total Protein: 5.8 g/dL — ABNORMAL LOW (ref 6.5–8.1)

## 2015-05-17 LAB — CBC
HCT: 41.6 % (ref 39.0–52.0)
HEMOGLOBIN: 14.3 g/dL (ref 13.0–17.0)
MCH: 35.4 pg — ABNORMAL HIGH (ref 26.0–34.0)
MCHC: 34.4 g/dL (ref 30.0–36.0)
MCV: 103 fL — ABNORMAL HIGH (ref 78.0–100.0)
Platelets: 219 10*3/uL (ref 150–400)
RBC: 4.04 MIL/uL — ABNORMAL LOW (ref 4.22–5.81)
RDW: 14.2 % (ref 11.5–15.5)
WBC: 4 10*3/uL (ref 4.0–10.5)

## 2015-05-17 LAB — MAGNESIUM: Magnesium: 2.2 mg/dL (ref 1.7–2.4)

## 2015-05-17 MED ORDER — AMLODIPINE BESYLATE 10 MG PO TABS
10.0000 mg | ORAL_TABLET | Freq: Every day | ORAL | Status: DC
  Administered 2015-05-17 – 2015-05-18 (×2): 10 mg via ORAL
  Filled 2015-05-17 (×2): qty 1

## 2015-05-17 MED ORDER — LISINOPRIL 20 MG PO TABS: 20.0000 mg | ORAL_TABLET | Freq: Every day | ORAL | Status: DC

## 2015-05-17 MED ORDER — METOPROLOL TARTRATE 12.5 MG HALF TABLET
12.5000 mg | ORAL_TABLET | Freq: Two times a day (BID) | ORAL | Status: DC
  Administered 2015-05-18: 12.5 mg via ORAL
  Filled 2015-05-17: qty 1

## 2015-05-17 MED ORDER — POTASSIUM CHLORIDE CRYS ER 20 MEQ PO TBCR
20.0000 meq | EXTENDED_RELEASE_TABLET | Freq: Three times a day (TID) | ORAL | Status: DC
  Administered 2015-05-17 – 2015-05-18 (×4): 20 meq via ORAL
  Filled 2015-05-17 (×4): qty 1

## 2015-05-17 MED ORDER — HYDROCODONE-ACETAMINOPHEN 5-325 MG PO TABS
1.0000 | ORAL_TABLET | ORAL | Status: DC | PRN
  Administered 2015-05-17 – 2015-05-18 (×3): 2 via ORAL
  Filled 2015-05-17 (×3): qty 2

## 2015-05-17 MED ORDER — ACETAMINOPHEN 325 MG PO TABS: 650.0000 mg | ORAL_TABLET | Freq: Four times a day (QID) | ORAL | Status: DC | PRN

## 2015-05-17 MED ORDER — SACCHAROMYCES BOULARDII 250 MG PO CAPS
250.0000 mg | ORAL_CAPSULE | Freq: Two times a day (BID) | ORAL | Status: DC
  Administered 2015-05-17 – 2015-05-18 (×3): 250 mg via ORAL
  Filled 2015-05-17 (×3): qty 1

## 2015-05-17 MED ORDER — LISINOPRIL 20 MG PO TABS
20.0000 mg | ORAL_TABLET | Freq: Every day | ORAL | Status: DC
  Administered 2015-05-17 – 2015-05-18 (×2): 20 mg via ORAL
  Filled 2015-05-17 (×2): qty 1

## 2015-05-17 MED ORDER — GUAIFENESIN ER 600 MG PO TB12
600.0000 mg | ORAL_TABLET | Freq: Two times a day (BID) | ORAL | Status: DC
  Administered 2015-05-17 – 2015-05-18 (×3): 600 mg via ORAL
  Filled 2015-05-17 (×3): qty 1

## 2015-05-17 NOTE — Care Management Note (Signed)
Case Management Note  Patient Details  Name: Gregory Lane MRN: [id number] Date of Birth: 04-Nov-1950  Subjective/Objective:                    Action/Plan: Spoke to patient regarding home health . Address on face sheet correct phone number is [phone] . Patient lives alone and is considering moving in with his sister or brother in [place] at discharge to assist with dressing changes and supervision .   Patient's insurance is not in network with [employer] , if plan is to discharge to home with negative pressure system would apply for Med[facility] through [facility] with [facility] aware . Application in shadow chart .   Expected Discharge Date:                  Expected Discharge Plan:  [place]  In-House Referral:     Discharge planning Services  CM Consult  Post Acute Care Choice:    Choice offered to:  Patient  DME Arranged:    DME Agency:     HH Arranged:    [place] Agency:     Status of Service:  In process, will continue to follow  Medicare Important Message Given:  Yes Date Medicare IM Given:    Medicare IM give by:    Date Additional Medicare IM Given:    Additional Medicare Important Message give by:     If discussed at [place] of Stay Meetings, dates discussed:    Additional Comments:  Marilu Favre, RN 05/17/2015, 9:01 AM

## 2015-05-17 NOTE — Discharge Instructions (Signed)
Stop smoking!!!  If you wish to quit smoking, help is available.  For free tobacco cessation program offerings call the [facility] at [phone] or Live Well Line at [phone]. You may also visit [email or web address] or email [email or web address]  for more information on other programs.   [facility], [place] [phone]  OPEN ABDOMINAL SURGERY: POST OP INSTRUCTIONS  Always review your discharge instruction sheet given to you by the facility where your surgery was performed.  IF YOU HAVE DISABILITY OR FAMILY LEAVE FORMS, YOU MUST BRING THEM TO THE OFFICE FOR PROCESSING.  PLEASE DO NOT GIVE THEM TO YOUR DOCTOR.  1. A prescription for pain medication may be given to you upon discharge.  Take your pain medication as prescribed, if needed.  If narcotic pain medicine is not needed, then you may take acetaminophen (Tylenol) or ibuprofen (Advil) as needed. 2. Take your usually prescribed medications unless otherwise directed. 3. If you need a refill on your pain medication, please contact your pharmacy. They will contact our office to request authorization.  Prescriptions will not be filled after 5pm or on week-ends. 4. You should follow a light diet the first few days after arrival home, such as soup and crackers, pudding, etc.unless your doctor has advised otherwise. A high-fiber, low fat diet can be resumed as tolerated.   Be sure to include lots of fluids daily. Most patients will experience some swelling and bruising on the chest and neck area.  Ice packs will help.  Swelling and bruising can take several days to resolve 5. Most patients will experience some swelling and bruising in the area of the incision. Ice pack will help. Swelling and bruising can take several days to resolve..  6. It is common to experience some constipation if taking pain medication after surgery.  Increasing fluid intake and taking a stool softener will usually help or prevent  this problem from occurring.  A mild laxative (Milk of Magnesia or Miralax) should be taken according to package directions if there are no bowel movements after 48 hours. 7.  You may have steri-strips (small skin tapes) in place directly over the incision.  These strips should be left on the skin for 7-10 days.  If your surgeon used skin glue on the incision, you may shower in 24 hours.  The glue will flake off over the next 2-3 weeks.  Any sutures or staples will be removed at the office during your follow-up visit. You may find that a light gauze bandage over your incision may keep your staples from being rubbed or pulled. You may shower and replace the bandage daily. 8. ACTIVITIES:  You may resume regular (light) daily activities beginning the next day--such as daily self-care, walking, climbing stairs--gradually increasing activities as tolerated.  You may have sexual intercourse when it is comfortable.  Refrain from any heavy lifting or straining until approved by your doctor. a. You may drive when you no longer are taking prescription pain medication, you can comfortably wear a seatbelt, and you can safely maneuver your car and apply brakes b. Return to Work: ___________________________________ 49. You should see your doctor in the office for a follow-up appointment approximately two weeks after your surgery.  Make sure that you call for this appointment within a day or two after you arrive home to insure a convenient appointment time. OTHER INSTRUCTIONS:  _____________________________________________________________ _____________________________________________________________  WHEN TO CALL YOUR DOCTOR: 1. Fever over 101.0 2. Inability to  urinate 3. Nausea and/or vomiting 4. Extreme swelling or bruising 5. Continued bleeding from incision. 6. Increased pain, redness, or drainage from the incision. 7. Difficulty swallowing or breathing 8. Muscle cramping or spasms. 9. Numbness or tingling in  hands or feet or around lips.  The clinic staff is available to answer your questions during regular business hours.  Please dont hesitate to call and ask to speak to one of the nurses if you have concerns.  For further questions, please visit [email or web address]

## 2015-05-17 NOTE — Care Management (Signed)
Patient now states he lives with a friend Para March who can assist him at home including dressing changes if he goes home with wet to dry .  Patient still unsure of PCP , other then [facility] , he has a card from PCP at home and he asked Altha Harm to look for it.  Magdalen Spatz RN BSN

## 2015-05-17 NOTE — Consult Note (Signed)
[facility] Medical Consultation  DILLIAN FEIG VEL:[id number] DOB: [birth year] DOA: 05/09/2015 PCP: No primary care provider on file.   Requesting physician: Dr. Hulen Skains Date of consultation: 05/18/15 Reason for consultation: Blood Pressure management                                             Respiratory issues management   Impression/Recommendations Active Problems:   Perforated duodenal ulcer ([place])   COPD suggested by initial evaluation ([place])   Essential hypertension   ETOH abuse   Tobacco abuse     HPI: Mr. Cuppett is a 65 yo male with a history of Hypertension followed at th [place], history of CVA without residuals, MI,  And a history of tobacco, admitted on 3/20 due perforated duodenal ulcer requiring emergent Graham patch closure, currently pod #8. During his hospitalization, his BP was controlled with hydralazine prn and Norvasc. In addition, he continued to need O2 while in hospital to avoid desaturation. CXR shows Mild hyperinflation consistent with COPD without alveolar pneumonia or edema. He is trying to ambulate with O2. Patient is a poor historian.  Denies fevers, chills, night sweats, vision changes, or mucositis. Denies any cough, hemoptysis or sputum production. Denies any chest pain or palpitations. Denies lower extremity swelling. Denies nausea or heartburn. He is tolerating soft diet, last bowel movement today.  His incisional abdominal pain controlled with meds. Denies any dysuria. Denies abnormal skin rashes, or neuropathy. Denies any bleeding issues such as epistaxis, hematemesis, hematuria or hematochezia.  He is expected to be d/c in am. IM was consulted for the management of his BP issues and HH oxygen plans prior to home.   Presumed COPD per CXR in a patient with smoking history Tobacco cessation, may need Nicotine patch on d/c Albuterol nebs q 4 hrs and q 2 hrs prn on d/c Mucinex prn Home Oxygen to maintain O sats > 90. Care management involved Will need follow  up as OP  Hypertension BP 147/78 mmHg  Pulse 87  Temp(Src) 97.7 F (36.5 C) (Oral)  Resp 18  Ht '6\' 1"'$  (1.854 m)  Wt 106.595 kg (235 lb)  BMI 31.01 kg/m2  SpO2 92%   Continue Norvasc as outpatient, start Lisinopril tonight and monitor tolerance to med May be addding Metoprolol pending on BP in am. Continue Hydralazine while in hospital Will need to follow up at [facility]  ETOH abuse Recommend ETOH [facility] as outpatient [facility] the [facility]  perforated duodenal ulcer s/pemergent Justyn Heal patch closure, currently pod #8. Most likely to [facility] drainage after discharge As per Surgery. Likely to be discharged on 3/28  Other medical issues as per admitting team  Review of Systems:  10 point Review of Systems was done, except as stated above, all other Review of Systems were negative.   Past Medical History  Diagnosis Date  . Hypertension   . Stroke ([place])   . Myocardial infarction ([place])   . Arthritis    Past Surgical History  Procedure Laterality Date  . Fracture surgery    . Eye surgery    . Laparotomy N/A 05/10/2015    Procedure: EXPLORATORY LAPAROTOMY;  Surgeon: Ralene Ok, MD;  Location: [facility];  Service: General;  Laterality: N/A;  . Exploratory laparotomy  05/12/2015   Social History:  reports that he has been smoking Cigarettes.  He has a 55 pack-year smoking history. He has  never used smokeless tobacco. He reports that he drinks about 36.0 oz of alcohol per week. He reports that he does not use illicit drugs.   No Known Allergies Family History  Problem Relation Age of Onset  . Cirrhosis Father     Prior to Admission medications   Not on File   Blood pressure 147/78, pulse 87, temperature 97.7 F (36.5 C), temperature source Oral, resp. rate 18, height '6\' 1"'$  (1.854 m), weight 106.595 kg (235 lb), SpO2 92 %.  Filed Vitals:   05/17/15 1236 05/17/15 1426  BP:  147/78  Pulse: 95 87  Temp:  97.7 F (36.5 C)  Resp:  18   Physical Exam: General:  lying in bed in NAD, stoic  appearnace HEENT:  appear Normal, Conjunctivae clear, PERRLA. Moist Oral Mucosa. Several missing teeth Neck: Supple Neck, No JVD, No cervical lymphadenopathy appreciated, No Carotid Bruits. Lung: Symmetrical Chest wall movement, Good air movement bilaterally, essentially CTAB except for scattered wheezing Cardio: RRR, No Gallops, Rubs or Murmurs, No Parasternal Heave. Abdomen: Positive Bowel Sounds, protuberant, incisional tenderness,open wound with dressing.  Skin: No Cyanosis, Normal Skin Turgor, No Skin Rash or Bruise. Musculoskeletal: Good muscle tone, joints appear normal, no effusions, Normal ROM. Lymph: No Palpable Lymph Nodes in Neck or Axillae Psych : Normal affect and insight, Not Suicidal or Homicidal, Awake Alert, Oriented X 3.  Neuro: No F.N deficits, al  Cranial Nerves Intact, Strength 5/5 all 4 extremities, Sensation intact all 4 extremities, Plantars down going.   Labs on Admission:  Basic Metabolic Panel:  Recent Labs Lab 05/11/15 0300 05/12/15 0450 05/17/15 1035  NA 138 136 139  K 3.9 3.6 2.9*  CL 107 104 103  CO2 '23 27 27  '$ GLUCOSE 119* 104* 123*  BUN '11 6 11  '$ CREATININE 0.83 0.79 0.72  CALCIUM 7.4* 7.9* 8.1*  MG 1.9  --  2.2  PHOS 2.3*  --   --    Liver Function Tests:  Recent Labs Lab 05/17/15 1035  AST 36  ALT 61  ALKPHOS 55  BILITOT 0.6  PROT 5.8*  ALBUMIN 2.5*   No results for input(s): LIPASE, AMYLASE in the last 168 hours. No results for input(s): AMMONIA in the last 168 hours. CBC:  Recent Labs Lab 05/11/15 0300 05/12/15 0450 05/17/15 1035  WBC 7.0 6.7 4.0  HGB 13.8 13.3 14.3  HCT 40.5 40.6 41.6  MCV 103.8* 104.9* 103.0*  PLT 122* 122* 219    Radiological Exams on Admission: Dg Chest 2 View  05/17/2015  CLINICAL DATA:  Onset of shortness of breath this morning, history of respiratory failure with hypoxia, current smoker. EXAM: CHEST  2 VIEW COMPARISON:  Portable chest x-ray of May 10, 2015 FINDINGS: The lungs are adequately  inflated. The interstitial markings are coarse in the mid and lower lung zones but are stable. The cardiac silhouette is top-normal in size but stable. The pulmonary vascularity is not engorged. The mediastinum is normal in width. The right subclavian venous catheter tip projects over the proximal third of the SVC. The bony thorax exhibits no acute abnormality. IMPRESSION: Mild hyperinflation consistent with COPD. Stable coarse lung markings in the mid and lower lung zones. There is no alveolar pneumonia nor pulmonary edema. Electronically Signed   By: David  [place] M.D.   On: 05/17/2015 10:11    EKG: SR QTC [facility]  If 7PM-7AM, please contact night-coverage [email or web address] Password [facility] 05/17/2015, 3:09 PM

## 2015-05-17 NOTE — Care Management Important Message (Signed)
Important Message  Patient Details  Name: Gregory Lane MRN: [id number] Date of Birth: 08/17/1950   Medicare Important Message Given:  Yes    [name] 05/17/2015, 1:08 PM

## 2015-05-17 NOTE — Progress Notes (Signed)
Occupational Therapy Treatment Patient Details Name: Gregory Lane MRN: [id number] DOB: 02-03-1951 Today's Date: 05/17/2015    History of present illness Patient is a 65 y/o male with hx of CVA, MI and HTN s/p EXPLORATORY LAPAROTOMY.   OT comments  Pt continues to make gradual progress towards occupational therapy goals. Pt declined to continue practice with AE for LB ADLs reporting that he did not find it useful, but still requires min assist to reach LB. Pt completed transfers and ambulation with supervision for balance and cues to use RW properly. Pt's O2 remained between 92-94% on 2L of O2 and provided pt with energy conservation handout and reviewed most pertinent strategies. Pt reports that he will go to his brother or sister's house, but needs the MD to clarify how long he will need 24/7 assistance for dressing changes (pt is unsure if he will need 24/7 for the rest of his life). Will continue to follow acutely.   Follow Up Recommendations  Home health OT;Supervision/Assistance - 24 hour    Equipment Recommendations  3 in 1 bedside comode    Recommendations for Other Services      Precautions / Restrictions Precautions Precautions: Fall Restrictions Weight Bearing Restrictions: No       Mobility Bed Mobility Overal bed mobility: Needs Assistance Bed Mobility: Supine to Sit     Supine to sit: Supervision;HOB elevated     General bed mobility comments: Pt refused to allow OT to flatten bed due to inability to breathe when laying flat. Explained importance of pt to practice getting out of a flat bed like home environment and explained that it would not be all the way flat - pt continued to refuse. HOB elevated, use of bedrails.  Transfers Overall transfer level: Needs assistance Equipment used: Rolling walker (2 wheeled) Transfers: Sit to/from Stand Sit to Stand: Supervision         General transfer comment: cues for hand placement and safety    Balance  Overall balance assessment: Needs assistance Sitting-balance support: No upper extremity supported;Feet unsupported Sitting balance-Leahy Scale: Good     Standing balance support: Bilateral upper extremity supported;During functional activity Standing balance-Leahy Scale: Poor Standing balance comment: Heavy reliance on RW for support                   ADL Overall ADL's : Needs assistance/impaired     Grooming: Supervision/safety;Standing   Upper Body Bathing: Set up;Sitting   Lower Body Bathing: Minimal assistance;Sit to/from stand Lower Body Bathing Details (indicate cue type and reason): Pt declining continued practice of AE - does not find it useful Upper Body Dressing : Set up;Sitting   Lower Body Dressing: Minimal assistance;Sit to/from stand Lower Body Dressing Details (indicate cue type and reason): Pt declining continued practice of AE - does not find it useful Toilet Transfer: Supervision/safety;Cueing for safety;Ambulation;Comfort height toilet;Grab bars;RW Armed forces technical officer Details (indicate cue type and reason): Pt heavily relying on grab bar use - reports he cannot fit 3in1 over toilet and his sink is a wall unit and is loose, so he cannot push up from that. Toileting- Clothing Manipulation and Hygiene: Sit to/from stand;Min guard       Functional mobility during ADLs: Min guard;Rolling walker General ADL Comments: Provided and reviewed energy conservation handout. Pt declined continued practice of AE and reported he didn't feel it was useful, however pt still having difficulty with reaching LB. Pt's SaO2 reamined 92-94% on 2L of O2 during rest and activity.  Vision                     Perception     Praxis      Cognition   Behavior During Therapy: WFL for tasks assessed/performed Overall Cognitive Status: Within Functional Limits for tasks assessed       Memory: Decreased short-term memory               Extremity/Trunk Assessment                Exercises General Exercises - Lower Extremity Long Arc Quad: AROM;Seated;Both;15 reps Hip Flexion/Marching: AROM;Seated;Both;15 reps Toe Raises: AROM;Seated;Both;15 reps   Shoulder Instructions       General Comments      Pertinent Vitals/ Pain       Pain Assessment: 0-10 Pain Score: 3  Pain Location: back Pain Descriptors / Indicators: Aching Pain Intervention(s): Limited activity within patient's tolerance;Repositioned  Home Living                                          Prior Functioning/Environment              Frequency Min 3X/week     Progress Toward Goals  OT Goals(current goals can now be found in the care plan section)  Progress towards OT goals: Progressing toward goals  Acute Rehab OT Goals Patient Stated Goal: to return home OT Goal Formulation: With patient Time For Goal Achievement: 05/27/15 Potential to Achieve Goals: Good ADL Goals Pt Will Perform Lower Body Bathing: with modified independence;with adaptive equipment;sitting/lateral leans;sit to/from stand Pt Will Perform Lower Body Dressing: with modified independence;with adaptive equipment;sitting/lateral leans;sit to/from stand Pt Will Transfer to Toilet: with modified independence;ambulating;bedside commode Pt Will Perform Toileting - Clothing Manipulation and hygiene: with modified independence;sit to/from stand Additional ADL Goal #1: Pt will demonstrate 3 energy conservation strategies to increase safety with ADLs.   Plan Discharge plan remains appropriate    Co-evaluation                 End of Session Equipment Utilized During Treatment: Gait belt;Rolling walker;Oxygen   Activity Tolerance Patient limited by fatigue   Patient Left in chair;with call bell/phone within reach   Nurse Communication Mobility status        Time: 9355-2174 OT Time Calculation (min): 30 min  Charges: OT General Charges $OT Visit: 1 Procedure OT  Treatments $Self Care/Home Management : 23-37 mins  Redmond Baseman, OTR/L Pager: [phone] 05/17/2015, 1:06 PM

## 2015-05-17 NOTE — Consult Note (Signed)
WOC asked to evaluate for NPWT to the abdominal wound, however met with CCS PA and at this time the decision on the use of NPWT is pending patient planned disposition at the time of DC.  If patient to DC home with NPWT will need to use [facility] and [employer] device for in-network benefits.  Will follow along with team and talk with CCS later on possible placement while inpatient and conversion to the [facility] product once patient DC to home.  Luana Tatro [place] RN,CWOCN [phone]

## 2015-05-17 NOTE — Progress Notes (Signed)
7 Days Post-Op  Subjectiv Still on O2, smoking since age 65 1PP, , Tolerating soft diet, + BM.  He lives alone, no pre admit medical follow up for some time.  He's not sure how long.  Objective: Vital signs in last 24 hours: Temp:  [97.7 F (36.5 C)-98.6 F (37 C)] 98.6 F (37 C) (03/27 0545) Pulse Rate:  [81-88] 81 (03/27 0545) Resp:  [18-19] 18 (03/27 0545) BP: (146-169)/(72-81) 146/81 mmHg (03/27 0545) SpO2:  [95 %-98 %] 96 % (03/27 0545) Last BM Date: 05/16/15 1540 Soft diet: Urine 2350 Drain:  135 No BM recorded  He reports several BM's Afebrile, VSS, BP is borderline elevated; O2 sats 95-98% on [place] No labs since 05/12/15 UGI yesterday:  1. No evidence of contrast leak after repair of duodenal ulcer. 2. Mild fold thickening involving the descending and transverse duodenum indicating mild duodenitis.  Intake/Output from previous day: 03/26 0701 - 03/27 0700 In: 2203 [P.O.:1540; I.V.:663] Out: 2485 [Urine:2350; Drains:135] Intake/Output this shift:    General appearance: alert, cooperative, no distress and On O2 Resp: Wheezing and some rales both bases GI: open abdomen redressed this AM early, but still has some drainage at the base.  It is clean and could most likely use a VAC system.  + BS, drain is clear.  Lab Results:  No results for input(s): WBC, HGB, HCT, PLT in the last 72 hours.  BMET No results for input(s): NA, K, CL, CO2, GLUCOSE, BUN, CREATININE, CALCIUM in the last 72 hours. PT/INR No results for input(s): LABPROT, INR in the last 72 hours.  No results for input(s): AST, ALT, ALKPHOS, BILITOT, PROT, ALBUMIN in the last 168 hours.   Lipase     Component Value Date/Time   LIPASE 68* 05/10/2015 0002     Studies/Results: No results found.  Medications: . amLODipine  5 mg Oral Daily  . enoxaparin (LOVENOX) injection  40 mg Subcutaneous Q24H  . hydrALAZINE  10 mg Intravenous Q6H  . pantoprazole  40 mg Oral BID  . piperacillin-tazobactam (ZOSYN)  IV   3.375 g Intravenous 3 times per day  . sodium chloride flush  10-40 mL Intracatheter Q12H   . dextrose 5 % and 0.9% NaCl 50 mL/hr at 05/16/15 3295   Prior to Admission medications   Not on File     Assessment/Plan Perforated duodenal ulcer S/p laparotomy and Silvestre Gunner repair of the duodenal ulcer, 05/10/15, Dr. Ralene Ok Post op respiratory failure/COPD/tobacco use/needs OP PFT's per CCM Hx of Hypertension Hx of stroke Hx of MI Body mass index is 31 Lives alone Antibiotics:  Day 9 Zosyn DVT:  Lovenox/SCD   Plan:  I am going to recheck labs and CXR, walk him without O2 and see how he does.  Ask Case management to see.  I'm not sure that he can go home safely at this point even if he was ready.  He has seen OT and PT.  If he goes home with wound vac it cannot be the [facility] system because of insurance. Hopefully OT/PT can see him today along with Case management and we can come up with a plan. Saline lock his IV. He is getting hydralazine q6H IV, along with Norvasc PO, dilaudid for pain.  Add oral pain medications.  I will increase his Norvasc, but I think we may need to get Medicine on board and work out a more definitive plan for his other issues also.       LOS: 7 days  Earnstine Regal 05/17/2015 [phone]

## 2015-05-17 NOTE — Progress Notes (Signed)
His film just shows COPD/hyperinflation, no pneumonia or COPD.  He just walked with O2 off and good saturations.  He doesn't like to do allot for himself.  I have told him he needs to walk to the bathroom.  He wants to know how long he would have to stay with family.  I said till he was independent, he wants me to say till the wound heals.  I will talk with Case Manager about wound vac.  K+ 2.9, I will replace.   He has no idea who his PCP is.  The only Doctor I can find is from a referral to Pulmonary for a sleep study in 2009.  That physician is in [place] now.  Telem shows SR with some PAC and some PVC's.  He does not know what medicines he is suppose to be on, he says it's someone on [facility].  He has also gone to the [place] in [place] also.  He doesn't know when he last went to either clinic.  I will ask Medicine to see and make recommendations for BP/COPD/possible CAD/prior CVA; until he can make his own follow up arrangements.

## 2015-05-17 NOTE — Progress Notes (Signed)
Physical Therapy Treatment Patient Details Name: Gregory Lane MRN: [id number] DOB: March 11, 1950 Today's Date: 05/17/2015    History of Present Illness Patient is a 65 y/o male with hx of CVA, MI and HTN s/p EXPLORATORY LAPAROTOMY.    PT Comments    Pt demonstrates self-limiting behavior wanting assist to adjust leg rest and back of chair with pt educated for need to help himself and instruction on how to work chair. Pt with sats 93% on RA at rest and 92-95% on RA with gait throughout. Pt somewhat apprehensive about being on RA but sats maintained and pt educated for no current need for O2 with RN made aware to spot check pt. Pt educated for mobility, gait and HEP and states he thinks he will probably go to his sister's but is concerned about his cat and dog.   Follow Up Recommendations  Home health PT;Supervision for mobility/OOB     Equipment Recommendations  Rolling walker with 5" wheels;3in1 (PT)    Recommendations for Other Services       Precautions / Restrictions Precautions Precautions: Fall  Pt with order for abdominal binder but none present in room, pt states he never received one and RN notified   Mobility  Bed Mobility               General bed mobility comments: in chair on arrival  Transfers Overall transfer level: Needs assistance   Transfers: Sit to/from Stand Sit to Stand: Supervision         General transfer comment: cues for hand placement and safety  Ambulation/Gait Ambulation/Gait assistance: Min guard Ambulation Distance (Feet): 300 Feet Assistive device: Rolling walker (2 wheeled) Gait Pattern/deviations: Step-through pattern;Decreased stride length;Trunk flexed   Gait velocity interpretation: Below normal speed for age/gender General Gait Details: cues for posture and position in RW. Cues x 2 for pursed lip breathing. Pt maintaining 92-95% on RA throughout activity   Stairs            Wheelchair Mobility    Modified Rankin  (Stroke Patients Only)       Balance Overall balance assessment: Needs assistance   Sitting balance-Leahy Scale: Good       Standing balance-Leahy Scale: Poor                      Cognition Arousal/Alertness: Awake/alert Behavior During Therapy: WFL for tasks assessed/performed Overall Cognitive Status: Within Functional Limits for tasks assessed       Memory: Decreased short-term memory              Exercises General Exercises - Lower Extremity Long Arc Quad: AROM;Seated;Both;15 reps Hip Flexion/Marching: AROM;Seated;Both;15 reps Toe Raises: AROM;Seated;Both;15 reps    General Comments        Pertinent Vitals/Pain Pain Score: 3  Pain Location: back pain Pain Descriptors / Indicators: Aching Pain Intervention(s): Limited activity within patient's tolerance;Repositioned    Home Living                      Prior Function            PT Goals (current goals can now be found in the care plan section) Progress towards PT goals: Progressing toward goals    Frequency       PT Plan Current plan remains appropriate    Co-evaluation             End of Session Equipment Utilized During Treatment: Gait belt Activity Tolerance: Patient  tolerated treatment well Patient left: in chair;with call bell/phone within Lane     Time: 1204-1232 PT Time Calculation (min) (ACUTE ONLY): 28 min  Charges:  $Gait Training: 8-22 mins $Therapeutic Exercise: 8-22 mins                    G Codes:      Gregory Lane June 16, 2015, 12:39 PM  Gregory Lane, [place]

## 2015-05-18 DIAGNOSIS — Z48815 Encounter for surgical aftercare following surgery on the digestive system: Secondary | ICD-10-CM | POA: Diagnosis not present

## 2015-05-18 DIAGNOSIS — I10 Essential (primary) hypertension: Secondary | ICD-10-CM

## 2015-05-18 DIAGNOSIS — I252 Old myocardial infarction: Secondary | ICD-10-CM | POA: Diagnosis not present

## 2015-05-18 DIAGNOSIS — Z8673 Personal history of transient ischemic attack (TIA), and cerebral infarction without residual deficits: Secondary | ICD-10-CM | POA: Diagnosis not present

## 2015-05-18 DIAGNOSIS — Z720 Tobacco use: Secondary | ICD-10-CM | POA: Diagnosis not present

## 2015-05-18 MED ORDER — METOPROLOL TARTRATE 25 MG PO TABS: 12.5000 mg | ORAL_TABLET | Freq: Two times a day (BID) | ORAL | Status: DC

## 2015-05-18 MED ORDER — AMLODIPINE BESYLATE 10 MG PO TABS: 10.0000 mg | ORAL_TABLET | Freq: Every day | ORAL | Status: DC

## 2015-05-18 MED ORDER — ALBUTEROL SULFATE HFA 108 (90 BASE) MCG/ACT IN AERS: 2.0000 | INHALATION_SPRAY | Freq: Four times a day (QID) | RESPIRATORY_TRACT | Status: DC | PRN

## 2015-05-18 MED ORDER — PANTOPRAZOLE SODIUM 40 MG PO TBEC: 40.0000 mg | DELAYED_RELEASE_TABLET | Freq: Two times a day (BID) | ORAL | Status: DC

## 2015-05-18 MED ORDER — LISINOPRIL 20 MG PO TABS: 20.0000 mg | ORAL_TABLET | Freq: Every day | ORAL | Status: DC

## 2015-05-18 MED ORDER — POTASSIUM CHLORIDE CRYS ER 20 MEQ PO TBCR: 20.0000 meq | EXTENDED_RELEASE_TABLET | Freq: Two times a day (BID) | ORAL | Status: DC

## 2015-05-18 MED ORDER — ALBUTEROL SULFATE (2.5 MG/3ML) 0.083% IN NEBU: 2.5000 mg | INHALATION_SOLUTION | Freq: Four times a day (QID) | RESPIRATORY_TRACT | Status: DC | PRN

## 2015-05-18 MED ORDER — HYDROCODONE-ACETAMINOPHEN 5-325 MG PO TABS: 1.0000 | ORAL_TABLET | ORAL | Status: DC | PRN

## 2015-05-18 MED ORDER — GUAIFENESIN ER 600 MG PO TB12: ORAL_TABLET | ORAL | Status: DC

## 2015-05-18 MED ORDER — ACETAMINOPHEN 325 MG PO TABS: 650.0000 mg | ORAL_TABLET | Freq: Four times a day (QID) | ORAL | Status: DC | PRN

## 2015-05-18 NOTE — Progress Notes (Signed)
[facility] PROGRESS NOTE  Gregory Lane NKN:[id number] DOB: [birth year] DOA: 05/09/2015 PCP: No primary care provider on file.  Assessment/Plan: 1. Hypertension- Patient started on Lisinopril, Metoprolol, Amlodipine. BP is stable. Patient can be discharged on these medications. 2. Hypokalemia- yesterday patient's potassium was 2.9. Labs today are pending. Patient will be discharged on K-Lor 20 mg by mouth twice a day for 4 days. As patient is on lisinopril itshould also help to raise the level of potassium. Patient should have a repeat BMP in 1 week. 3. Perforated duodenal ulcer- status post laparotomy and Graham patch repair as per general surgery.        HPI/Subjective: 65 y.o. male with a Past Medical History of HTN, CVA, MI who presents with perforation of the duodenum managed by general surgery. Over the course of patient's hospitalization he has had blood pressure which has been difficult to control as well as persistent shortness of breath requiring submillimeter 2. Patient likely with undiagnosed underlying COPD given history of tobacco use.   Patient seen and examined, denies any complaints. Anxious to go home.  Objective: Filed Vitals:   05/18/15 0108 05/18/15 0544  BP: 167/77 153/71  Pulse:  82  Temp:  97.8 F (36.6 C)  Resp:  19    Intake/Output Summary (Last 24 hours) at 05/18/15 1349 Last data filed at 05/18/15 0545  Gross per 24 hour  Intake   1160 ml  Output    710 ml  Net    450 ml   Filed Weights   05/09/15 2337  Weight: 106.595 kg (235 lb)    Exam:   General: Appears in no acute distress  Cardiovascular: S1-S2 regular  Respiratory: Clear to auscultation bilaterally  Abdomen: Soft, nontender, no organomegaly  Musculoskeletal: No cyanosis/clubbing/edema of the lower extremities   Data Reviewed: Basic Metabolic Panel:  Recent Labs Lab 05/12/15 0450 05/17/15 1035  NA 136 139  K 3.6 2.9*  CL 104 103  CO2 27 27  GLUCOSE 104* 123*   BUN 6 11  CREATININE 0.79 0.72  CALCIUM 7.9* 8.1*  MG  --  2.2   Liver Function Tests:  Recent Labs Lab 05/17/15 1035  AST 36  ALT 61  ALKPHOS 55  BILITOT 0.6  PROT 5.8*  ALBUMIN 2.5*   No results for input(s): LIPASE, AMYLASE in the last 168 hours. No results for input(s): AMMONIA in the last 168 hours. CBC:  Recent Labs Lab 05/12/15 0450 05/17/15 1035  WBC 6.7 4.0  HGB 13.3 14.3  HCT 40.6 41.6  MCV 104.9* 103.0*  PLT 122* 219    CBG: No results for input(s): GLUCAP in the last 168 hours.  Recent Results (from the past 240 hour(s))  Blood Culture (routine x 2)     Status: None   Collection Time: 05/10/15  2:01 AM  Result Value Ref Range Status   Specimen Description BLOOD RIGHT HAND  Final   Special Requests BOTTLES DRAWN AEROBIC ONLY 6CC  Final   Culture NO GROWTH 5 DAYS  Final   Report Status 05/15/2015 FINAL  Final  Blood Culture (routine x 2)     Status: None   Collection Time: 05/10/15  2:47 AM  Result Value Ref Range Status   Specimen Description BLOOD RIGHT ANTECUBITAL  Final   Special Requests BOTTLES DRAWN AEROBIC AND ANAEROBIC 5CC  Final   Culture NO GROWTH 5 DAYS  Final   Report Status 05/15/2015 FINAL  Final  MRSA PCR Screening  Status: None   Collection Time: 05/10/15 12:39 PM  Result Value Ref Range Status   MRSA by PCR NEGATIVE NEGATIVE Final    Comment:        The GeneXpert MRSA Assay (FDA approved for NASAL specimens only), is one component of a comprehensive MRSA colonization surveillance program. It is not intended to diagnose MRSA infection nor to guide or monitor treatment for MRSA infections.      Studies: Dg Chest 2 View  05/17/2015  CLINICAL DATA:  Onset of shortness of breath this morning, history of respiratory failure with hypoxia, current smoker. EXAM: CHEST  2 VIEW COMPARISON:  Portable chest x-ray of May 10, 2015 FINDINGS: The lungs are adequately inflated. The interstitial markings are coarse in the mid and  lower lung zones but are stable. The cardiac silhouette is top-normal in size but stable. The pulmonary vascularity is not engorged. The mediastinum is normal in width. The right subclavian venous catheter tip projects over the proximal third of the SVC. The bony thorax exhibits no acute abnormality. IMPRESSION: Mild hyperinflation consistent with COPD. Stable coarse lung markings in the mid and lower lung zones. There is no alveolar pneumonia nor pulmonary edema. Electronically Signed   By: David  Martinique M.D.   On: 05/17/2015 10:11    Scheduled Meds: . amLODipine  10 mg Oral Daily  . enoxaparin (LOVENOX) injection  40 mg Subcutaneous Q24H  . guaiFENesin  600 mg Oral BID  . hydrALAZINE  10 mg Intravenous Q6H  . lisinopril  20 mg Oral Daily  . metoprolol tartrate  12.5 mg Oral BID  . pantoprazole  40 mg Oral BID  . piperacillin-tazobactam (ZOSYN)  IV  3.375 g Intravenous 3 times per day  . potassium chloride  20 mEq Oral TID PC  . saccharomyces boulardii  250 mg Oral BID  . sodium chloride flush  10-40 mL Intracatheter Q12H   Continuous Infusions:   Active Problems:   Perforated duodenal ulcer ([place])   COPD suggested by initial evaluation ([place])   Essential hypertension   ETOH abuse   Tobacco abuse   Medical non-compliance    Time spent: 25 min    [place] [facility] Pager [phone]. If 7PM-7AM, please contact night-coverage at [email or web address], password [facility] 05/18/2015, 1:49 PM  LOS: 8 days

## 2015-05-18 NOTE — Progress Notes (Signed)
SATURATION QUALIFICATIONS: (This note is used to comply with regulatory documentation for home oxygen)  Patient Saturations on Room Air at Rest = 96% (today, 05/18/15)  Patient Saturations on Room Air while Ambulating = 92-95% (per PT note on 05/17/15)   Gregory Lane

## 2015-05-18 NOTE — Progress Notes (Signed)
8 Days Post-Op  Subjective: He wants something for sleep.  Up in chair watching TV.  He is wheezing some, but is better.  Open wound looks fine.  I gave him the choice of Wound vac or dressing changes wet to dry.  He is opting for the wound vac.   Objective: Vital signs in last 24 hours: Temp:  [97.7 F (36.5 C)-98.3 F (36.8 C)] 97.8 F (36.6 C) (03/28 0544) Pulse Rate:  [82-95] 82 (03/28 0544) Resp:  [18-19] 19 (03/28 0544) BP: (147-167)/(71-78) 153/71 mmHg (03/28 0544) SpO2:  [92 %-94 %] 94 % (03/28 0544) Last BM Date: 05/17/15 1520 PO  Soft diet 850 urine Drain 140 Afebrile, VSS  BP much better control Labs pending CXR yesterday shows COPD Intake/Output from previous day: 03/27 0701 - 03/28 0700 In: 1520 [P.O.:1520] Out: 990 [Urine:850; Drains:140] Intake/Output this shift:    General appearance: alert, cooperative and no distress Resp: few rales and some wheezing but better.  He is off O2 and sats are good. GI: open wound looks good, drain is clear.  + BS, tolerating diet well.  Lab Results:   Recent Labs  05/17/15 1035  WBC 4.0  HGB 14.3  HCT 41.6  PLT 219    BMET  Recent Labs  05/17/15 1035  NA 139  K 2.9*  CL 103  CO2 27  GLUCOSE 123*  BUN 11  CREATININE 0.72  CALCIUM 8.1*   PT/INR No results for input(s): LABPROT, INR in the last 72 hours.   Recent Labs Lab 05/17/15 1035  AST 36  ALT 61  ALKPHOS 55  BILITOT 0.6  PROT 5.8*  ALBUMIN 2.5*     Lipase     Component Value Date/Time   LIPASE 68* 05/10/2015 0002     Studies/Results: Dg Chest 2 View  05/17/2015  CLINICAL DATA:  Onset of shortness of breath this morning, history of respiratory failure with hypoxia, current smoker. EXAM: CHEST  2 VIEW COMPARISON:  Portable chest x-ray of May 10, 2015 FINDINGS: The lungs are adequately inflated. The interstitial markings are coarse in the mid and lower lung zones but are stable. The cardiac silhouette is top-normal in size but stable. The  pulmonary vascularity is not engorged. The mediastinum is normal in width. The right subclavian venous catheter tip projects over the proximal third of the SVC. The bony thorax exhibits no acute abnormality. IMPRESSION: Mild hyperinflation consistent with COPD. Stable coarse lung markings in the mid and lower lung zones. There is no alveolar pneumonia nor pulmonary edema. Electronically Signed   By: David  Martinique M.D.   On: 05/17/2015 10:11    Medications: . amLODipine  10 mg Oral Daily  . enoxaparin (LOVENOX) injection  40 mg Subcutaneous Q24H  . guaiFENesin  600 mg Oral BID  . hydrALAZINE  10 mg Intravenous Q6H  . lisinopril  20 mg Oral Daily  . metoprolol tartrate  12.5 mg Oral BID  . pantoprazole  40 mg Oral BID  . piperacillin-tazobactam (ZOSYN)  IV  3.375 g Intravenous 3 times per day  . potassium chloride  20 mEq Oral TID PC  . saccharomyces boulardii  250 mg Oral BID  . sodium chloride flush  10-40 mL Intracatheter Q12H    Assessment/Plan Perforated duodenal ulcer S/p laparotomy and Kole Heal Patch repair of the duodenal ulcer, 05/10/15, Dr. Ralene Ok Post op respiratory failure/COPD/tobacco use/needs OP PFT's per CCM Hx of Hypertension Hx of stroke Hx of MI Body mass index is 31 Lives  alone Hypokalemia H pylori still pending Antibiotics: Day 10 Zosyn DVT: Lovenox/SCD    Plan:  Pull drain and work on getting him ready to go home.  He wants to use the Wound vac system  I have discussed smoking cessation, and follow up with [facility] PCP or [facility].  I told him we were giving him a months supply of medicine, at which point he notes he has medicine at home.  I told him to take our pill and his to PCP and let them determine what to take after discharge.  I am going to get him set up for HOME PT, OT, RN, NEBS, AND HOME O2.  Aim to get him out today, Case Manager says it would be best to allow [facility] place wound vac on at his home.  So he will go home with wet to dry dressing.  i am rechecking  his BMP to see where his K+ is.    LOS: 8 days    Jamison Soward 05/18/2015 [phone]

## 2015-05-18 NOTE — Care Management Note (Signed)
Case Management Note  Patient Details  Name: MAYAN KLOEPFER MRN: [id number] Date of Birth: 1950/09/28  Subjective/Objective:                    Action/Plan:  Medela negative wound pressure system ordered for home . [place] will apply system to wound.   Bedside nurse will check oxygen saturations to see if patient needs home oxygen. Expected Discharge Date:                  Expected Discharge Plan:  [place]  In-House Referral:     Discharge planning Services  CM Consult  Post Acute Care Choice:    Choice offered to:  Patient  DME Arranged:  3-N-1, [facility] DME Agency:  [place]:  RN, PT, OT [facility] Agency:  [place]  Status of Service:  Completed, signed off  Medicare Important Message Given:  Yes Date Medicare IM Given:    Medicare IM give by:    Date Additional Medicare IM Given:    Additional Medicare Important Message give by:     If discussed at [place] of Stay Meetings, dates discussed:    Additional Comments:  Marilu Favre, RN 05/18/2015, 11:29 AM

## 2015-05-18 NOTE — Progress Notes (Signed)
Discharge instructions reviewed with pt and pt's sister and prescriptions given.  Pt and pt's sister verbalized understanding and had no questions.  HH arranged and walker and 3-in-1 had been delivered.  Pt discharged in stable condition via wheelchair with sister with belongings.  Gregory Lane

## 2015-05-18 NOTE — Discharge Summary (Signed)
Physician Discharge Summary  Patient ID: Gregory Lane MRN: [id number] DOB/AGE: Apr 14, 1950 65 y.o.  Admit date: 05/09/2015 Discharge date: 05/18/2015  Admission Diagnoses:  65 year old male with perforated viscus 1. History of MI 2. History of CVA  Discharge Diagnoses:  Perforated duodenal ulcer Post op respiratory failure/COPD/tobacco use/needs OP PFT's per CCM Hx of Hypertension Hx of stroke Hx of MI Body mass index is 31 Lives alone Hypokalemia Ongoing ETOH use at home H pylori still pending   Active Problems:   Perforated duodenal ulcer (HCC)   COPD suggested by initial evaluation ([place])   Essential hypertension   ETOH abuse   Tobacco abuse   Medical non-compliance   PROCEDURES:  Laparotomy and Boomer Heal Patch repair of the duodenal ulcer, 05/10/15, Dr. Marchia Meiers Course:  Patient is a 65 year old male who comes in today secondary to abdominal pain which she states started between 8 and 10 PM. He states the pain was unrelenting thereafter. He states he noticed no pain prior to this. Patient presented to ER for further evaluation. Upon evaluation ER the patient underwent CT scan and laboratory functions. CT scan revealed perforated viscus, free air, but could not localize the perforated viscus. Surgery was consult for further management. Patient was given antibiotics in the ER.  He was admitted and taken to the OR later that morning.   Post op he had to be kept on the Ventilator over night, and extubated the next day.  He had a large open wound and we packed it wet to dry during his stay.  He had issues with BP and pulmonary toilet post op.  He required OT and PT assistance and they recommended home health with each of their services.  His BP was treated with Lopressor, amlodopine, and lisinopril.  We used albuterol nebulizer's here in the hospital along with IS for his Pulmonary toilet.  We considered home O2, but his saturations improved with mobilization and he  did not meet criteria for home O2.  After allot of effort he decided to return home, home health will place a wound vac on him there for dressing changes.  He could never tell us who his PCP was, who he saw at the [place], nor the last time he was seen by either.  He says he has BP meds at home, but he takes them only when he thinks he needs it.  His sister reports he said his doctor said he could drink and smoke.  We have strongly advised against this.  His H Pylori and K+ are still pending.  I am sending him home on 4 days of K+ supplement and we are instructing him to get BMP rechecked 05/21/15, or 05/24/15, with report to PCP.  [facility] recommended follow up and PFT's at their office after he recovers from his surgery.   His open wound is clean and pink and healing nicely currently.   CBC Latest Ref Rng 05/17/2015 05/12/2015 05/11/2015  WBC 4.0 - 10.5 K/uL 4.0 6.7 7.0  Hemoglobin 13.0 - 17.0 g/dL 14.3 13.3 13.8  Hematocrit 39.0 - 52.0 % 41.6 40.6 40.5  Platelets 150 - 400 K/uL 219 122(L) 122(L)   CMP Latest Ref Rng 05/17/2015 05/12/2015 05/11/2015  Glucose 65 - 99 mg/dL 123(H) 104(H) 119(H)  BUN 6 - 20 mg/dL '11 6 11  '$ Creatinine 0.61 - 1.24 mg/dL 0.72 0.79 0.83  Sodium 135 - 145 mmol/L 139 136 138  Potassium 3.5 - 5.1 mmol/L 2.9(L) 3.6 3.9  Chloride 101 -  111 mmol/L 103 104 107  CO2 22 - 32 mmol/L '27 27 23  '$ Calcium 8.9 - 10.3 mg/dL 8.1(L) 7.9(L) 7.4(L)  Total Protein 6.5 - 8.1 g/dL 5.8(L) - -  Total Bilirubin 0.3 - 1.2 mg/dL 0.6 - -  Alkaline Phos 38 - 126 U/L 55 - -  AST 15 - 41 U/L 36 - -  ALT 17 - 63 U/L 61 - -     Condition on dc:  Improving     Disposition: Home with home health nurse, OT, and PT     Medication List    TAKE these medications        acetaminophen 325 MG tablet  Commonly known as:  TYLENOL  Take 2 tablets (650 mg total) by mouth every 6 (six) hours as needed for mild pain, moderate pain, fever or headache.     albuterol 108 (90 Base) MCG/ACT inhaler   Commonly known as:  PROVENTIL HFA;VENTOLIN HFA  Inhale 2 puffs into the lungs every 6 (six) hours as needed for wheezing or shortness of breath.     amLODipine 10 MG tablet  Commonly known as:  NORVASC  Take 1 tablet (10 mg total) by mouth daily.     guaiFENesin 600 MG 12 hr tablet  Commonly known as:  [place] can buy this at any drug store without prescription.     HYDROcodone-acetaminophen 5-325 MG tablet  Commonly known as:  NORCO/VICODIN  Take 1-2 tablets by mouth every 4 (four) hours as needed for moderate pain.     lisinopril 20 MG tablet  Commonly known as:  PRINIVIL,ZESTRIL  Take 1 tablet (20 mg total) by mouth daily.     metoprolol tartrate 25 MG tablet  Commonly known as:  LOPRESSOR  Take 0.5 tablets (12.5 mg total) by mouth 2 (two) times daily.     pantoprazole 40 MG tablet  Commonly known as:  PROTONIX  Take 1 tablet (40 mg total) by mouth 2 (two) times daily.     potassium chloride SA 20 MEQ tablet  Commonly known as:  K-DUR,KLOR-CON  Take 1 tablet (20 mEq total) by mouth 2 (two) times daily.       Follow-up Information    Follow up with Call your primary care or the [facility] for follow up appointment.   Why:  Make an appointment to have them look over your blood pressure and your lung disease.  Recheck you Potassium end of this week or early next week.      Follow up with [facility].   Specialty:  Pulmonology   Why:  Make an appointment for follow up and pulmonary function studies.  Stop Smoking!!   Contact information:   [place] [phone]      Follow up with Rosario Jacks., Anne Hahn, MD. Schedule an appointment as soon as possible for a visit in 3 weeks.   Specialty:  General Surgery   Why:  Call and make an appointment in 2-3 weeks.     Contact information:   [place] [address] [phone]       Follow up with Have your Primary care doctor.   Why:  Recheck  BMP Friday 05/21/15 or 05/24/15.      SignedEarnstine Regal 05/18/2015, 2:27 PM

## 2015-05-19 DIAGNOSIS — T8189XA Other complications of procedures, not elsewhere classified, initial encounter: Secondary | ICD-10-CM | POA: Diagnosis not present

## 2015-05-19 DIAGNOSIS — Z48815 Encounter for surgical aftercare following surgery on the digestive system: Secondary | ICD-10-CM | POA: Diagnosis not present

## 2015-05-19 DIAGNOSIS — Z720 Tobacco use: Secondary | ICD-10-CM | POA: Diagnosis not present

## 2015-05-19 DIAGNOSIS — K265 Chronic or unspecified duodenal ulcer with perforation: Secondary | ICD-10-CM | POA: Diagnosis not present

## 2015-05-19 DIAGNOSIS — Z8673 Personal history of transient ischemic attack (TIA), and cerebral infarction without residual deficits: Secondary | ICD-10-CM | POA: Diagnosis not present

## 2015-05-19 DIAGNOSIS — I10 Essential (primary) hypertension: Secondary | ICD-10-CM | POA: Diagnosis not present

## 2015-05-19 DIAGNOSIS — I252 Old myocardial infarction: Secondary | ICD-10-CM | POA: Diagnosis not present

## 2015-05-19 LAB — H. PYLORI ANTIBODY, IGG: H Pylori IgG: <0.9 U/mL (ref 0.0–0.8)

## 2015-05-20 DIAGNOSIS — H538 Other visual disturbances: Secondary | ICD-10-CM | POA: Diagnosis not present

## 2015-05-20 DIAGNOSIS — Z131 Encounter for screening for diabetes mellitus: Secondary | ICD-10-CM | POA: Diagnosis not present

## 2015-05-20 DIAGNOSIS — K279 Peptic ulcer, site unspecified, unspecified as acute or chronic, without hemorrhage or perforation: Secondary | ICD-10-CM | POA: Diagnosis not present

## 2015-05-20 DIAGNOSIS — I10 Essential (primary) hypertension: Secondary | ICD-10-CM | POA: Diagnosis not present

## 2015-05-20 DIAGNOSIS — Z0000 Encounter for general adult medical examination without abnormal findings: Secondary | ICD-10-CM | POA: Diagnosis not present

## 2015-05-20 DIAGNOSIS — I2510 Atherosclerotic heart disease of native coronary artery without angina pectoris: Secondary | ICD-10-CM | POA: Diagnosis not present

## 2015-05-20 DIAGNOSIS — Z8673 Personal history of transient ischemic attack (TIA), and cerebral infarction without residual deficits: Secondary | ICD-10-CM | POA: Diagnosis not present

## 2015-05-20 DIAGNOSIS — Z01118 Encounter for examination of ears and hearing with other abnormal findings: Secondary | ICD-10-CM | POA: Diagnosis not present

## 2015-05-20 DIAGNOSIS — Z720 Tobacco use: Secondary | ICD-10-CM | POA: Diagnosis not present

## 2015-05-21 DIAGNOSIS — Z8673 Personal history of transient ischemic attack (TIA), and cerebral infarction without residual deficits: Secondary | ICD-10-CM | POA: Diagnosis not present

## 2015-05-21 DIAGNOSIS — I10 Essential (primary) hypertension: Secondary | ICD-10-CM | POA: Diagnosis not present

## 2015-05-21 DIAGNOSIS — I252 Old myocardial infarction: Secondary | ICD-10-CM | POA: Diagnosis not present

## 2015-05-21 DIAGNOSIS — Z48815 Encounter for surgical aftercare following surgery on the digestive system: Secondary | ICD-10-CM | POA: Diagnosis not present

## 2015-05-21 DIAGNOSIS — Z720 Tobacco use: Secondary | ICD-10-CM | POA: Diagnosis not present

## 2015-05-24 ENCOUNTER — Emergency Department (HOSPITAL_COMMUNITY): Payer: Medicare HMO

## 2015-05-24 ENCOUNTER — Inpatient Hospital Stay (HOSPITAL_COMMUNITY)
Admission: EM | Admit: 2015-05-24 | Discharge: 2015-05-26 | DRG: 192 | Disposition: A | Discharge status: Home Health | Payer: Medicare HMO | Attending: Family Medicine | Admitting: Family Medicine

## 2015-05-24 ENCOUNTER — Encounter (HOSPITAL_COMMUNITY): Payer: Self-pay | Admitting: *Deleted

## 2015-05-24 DIAGNOSIS — R0902 Hypoxemia: Secondary | ICD-10-CM | POA: Diagnosis present

## 2015-05-24 DIAGNOSIS — I10 Essential (primary) hypertension: Secondary | ICD-10-CM | POA: Diagnosis present

## 2015-05-24 DIAGNOSIS — Z8673 Personal history of transient ischemic attack (TIA), and cerebral infarction without residual deficits: Secondary | ICD-10-CM

## 2015-05-24 DIAGNOSIS — Z79899 Other long term (current) drug therapy: Secondary | ICD-10-CM | POA: Diagnosis not present

## 2015-05-24 DIAGNOSIS — K265 Chronic or unspecified duodenal ulcer with perforation: Secondary | ICD-10-CM | POA: Diagnosis not present

## 2015-05-24 DIAGNOSIS — J441 Chronic obstructive pulmonary disease with (acute) exacerbation: Secondary | ICD-10-CM | POA: Diagnosis present

## 2015-05-24 DIAGNOSIS — F17210 Nicotine dependence, cigarettes, uncomplicated: Secondary | ICD-10-CM | POA: Diagnosis not present

## 2015-05-24 DIAGNOSIS — F1010 Alcohol abuse, uncomplicated: Secondary | ICD-10-CM | POA: Diagnosis not present

## 2015-05-24 DIAGNOSIS — I252 Old myocardial infarction: Secondary | ICD-10-CM

## 2015-05-24 DIAGNOSIS — R05 Cough: Secondary | ICD-10-CM | POA: Diagnosis present

## 2015-05-24 DIAGNOSIS — R058 Other specified cough: Secondary | ICD-10-CM | POA: Diagnosis present

## 2015-05-24 DIAGNOSIS — R600 Localized edema: Secondary | ICD-10-CM | POA: Diagnosis present

## 2015-05-24 DIAGNOSIS — T8189XA Other complications of procedures, not elsewhere classified, initial encounter: Secondary | ICD-10-CM | POA: Diagnosis not present

## 2015-05-24 DIAGNOSIS — I2510 Atherosclerotic heart disease of native coronary artery without angina pectoris: Secondary | ICD-10-CM | POA: Diagnosis not present

## 2015-05-24 DIAGNOSIS — Z9119 Patient's noncompliance with other medical treatment and regimen: Secondary | ICD-10-CM

## 2015-05-24 DIAGNOSIS — R0602 Shortness of breath: Secondary | ICD-10-CM | POA: Diagnosis not present

## 2015-05-24 DIAGNOSIS — Z720 Tobacco use: Secondary | ICD-10-CM | POA: Diagnosis not present

## 2015-05-24 DIAGNOSIS — Z48815 Encounter for surgical aftercare following surgery on the digestive system: Secondary | ICD-10-CM | POA: Diagnosis not present

## 2015-05-24 DIAGNOSIS — Z4889 Encounter for other specified surgical aftercare: Secondary | ICD-10-CM

## 2015-05-24 DIAGNOSIS — R0600 Dyspnea, unspecified: Secondary | ICD-10-CM | POA: Diagnosis not present

## 2015-05-24 DIAGNOSIS — R609 Edema, unspecified: Secondary | ICD-10-CM

## 2015-05-24 LAB — COMPREHENSIVE METABOLIC PANEL
ALK PHOS: 71 U/L (ref 38–126)
ALT: 35 U/L (ref 17–63)
ANION GAP: 12 (ref 5–15)
AST: 18 U/L (ref 15–41)
Albumin: 3.2 g/dL — ABNORMAL LOW (ref 3.5–5.0)
BUN: 7 mg/dL (ref 6–20)
CALCIUM: 8.8 mg/dL — AB (ref 8.9–10.3)
CO2: 23 mmol/L (ref 22–32)
CREATININE: 0.8 mg/dL (ref 0.61–1.24)
Chloride: 104 mmol/L (ref 101–111)
Glucose, Bld: 96 mg/dL (ref 65–99)
Potassium: 4.4 mmol/L (ref 3.5–5.1)
Sodium: 139 mmol/L (ref 135–145)
Total Bilirubin: 0.8 mg/dL (ref 0.3–1.2)
Total Protein: 7.1 g/dL (ref 6.5–8.1)

## 2015-05-24 LAB — CBC WITH DIFFERENTIAL/PLATELET
BASOS ABS: 0.1 10*3/uL (ref 0.0–0.1)
BASOS PCT: 1 %
EOS ABS: 0.1 10*3/uL (ref 0.0–0.7)
EOS PCT: 1 %
HCT: 40.9 % (ref 39.0–52.0)
Hemoglobin: 13.6 g/dL (ref 13.0–17.0)
Lymphocytes Relative: 13 %
Lymphs Abs: 1 10*3/uL (ref 0.7–4.0)
MCH: 34.2 pg — ABNORMAL HIGH (ref 26.0–34.0)
MCHC: 33.3 g/dL (ref 30.0–36.0)
MCV: 102.8 fL — ABNORMAL HIGH (ref 78.0–100.0)
Monocytes Absolute: 0.6 10*3/uL (ref 0.1–1.0)
Monocytes Relative: 8 %
Neutro Abs: 6.1 10*3/uL (ref 1.7–7.7)
Neutrophils Relative %: 77 %
PLATELETS: 324 10*3/uL (ref 150–400)
RBC: 3.98 MIL/uL — AB (ref 4.22–5.81)
RDW: 14.3 % (ref 11.5–15.5)
WBC: 7.9 10*3/uL (ref 4.0–10.5)

## 2015-05-24 LAB — URINALYSIS, ROUTINE W REFLEX MICROSCOPIC
Bilirubin Urine: NEGATIVE
Glucose, UA: NEGATIVE mg/dL
HGB URINE DIPSTICK: NEGATIVE
Ketones, ur: NEGATIVE mg/dL
LEUKOCYTES UA: NEGATIVE
NITRITE: NEGATIVE
PROTEIN: NEGATIVE mg/dL
Specific Gravity, Urine: 1.035 — ABNORMAL HIGH (ref 1.005–1.030)
pH: 8 (ref 5.0–8.0)

## 2015-05-24 LAB — I-STAT TROPONIN, ED: TROPONIN I, POC: 0 ng/mL (ref 0.00–0.08)

## 2015-05-24 LAB — I-STAT CG4 LACTIC ACID, ED
LACTIC ACID, VENOUS: 0.79 mmol/L (ref 0.5–2.0)
Lactic Acid, Venous: 1.8 mmol/L (ref 0.5–2.0)

## 2015-05-24 MED ORDER — AMLODIPINE BESYLATE 10 MG PO TABS
10.0000 mg | ORAL_TABLET | Freq: Every day | ORAL | Status: DC
  Administered 2015-05-24 – 2015-05-26 (×3): 10 mg via ORAL
  Filled 2015-05-24 (×2): qty 1
  Filled 2015-05-24: qty 2

## 2015-05-24 MED ORDER — ALBUTEROL SULFATE (2.5 MG/3ML) 0.083% IN NEBU
2.5000 mg | INHALATION_SOLUTION | RESPIRATORY_TRACT | Status: DC | PRN
  Administered 2015-05-25: 2.5 mg via RESPIRATORY_TRACT
  Filled 2015-05-24: qty 3

## 2015-05-24 MED ORDER — ENOXAPARIN SODIUM 40 MG/0.4ML ~~LOC~~ SOLN
40.0000 mg | SUBCUTANEOUS | Status: DC
  Administered 2015-05-25 – 2015-05-26 (×2): 40 mg via SUBCUTANEOUS
  Filled 2015-05-24 (×2): qty 0.4

## 2015-05-24 MED ORDER — LISINOPRIL 20 MG PO TABS
20.0000 mg | ORAL_TABLET | Freq: Every day | ORAL | Status: DC
  Administered 2015-05-24 – 2015-05-26 (×3): 20 mg via ORAL
  Filled 2015-05-24 (×3): qty 1

## 2015-05-24 MED ORDER — IOPAMIDOL (ISOVUE-370) INJECTION 76%
INTRAVENOUS | Status: AC
  Administered 2015-05-24: 100 mL
  Filled 2015-05-24: qty 100

## 2015-05-24 MED ORDER — HYDROCODONE-ACETAMINOPHEN 5-325 MG PO TABS
1.0000 | ORAL_TABLET | ORAL | Status: DC | PRN
  Administered 2015-05-24 – 2015-05-26 (×4): 2 via ORAL
  Filled 2015-05-24 (×4): qty 2

## 2015-05-24 MED ORDER — PREDNISONE 50 MG PO TABS
50.0000 mg | ORAL_TABLET | Freq: Every day | ORAL | Status: DC
  Administered 2015-05-25 – 2015-05-26 (×2): 50 mg via ORAL
  Filled 2015-05-24 (×2): qty 1

## 2015-05-24 MED ORDER — PREDNISONE 20 MG PO TABS
60.0000 mg | ORAL_TABLET | Freq: Once | ORAL | Status: AC
  Administered 2015-05-24: 60 mg via ORAL
  Filled 2015-05-24: qty 3

## 2015-05-24 MED ORDER — VITAMIN B-1 100 MG PO TABS
100.0000 mg | ORAL_TABLET | Freq: Every day | ORAL | Status: DC
  Administered 2015-05-25 – 2015-05-26 (×2): 100 mg via ORAL
  Filled 2015-05-24 (×2): qty 1

## 2015-05-24 MED ORDER — METRONIDAZOLE 500 MG PO TABS
500.0000 mg | ORAL_TABLET | Freq: Three times a day (TID) | ORAL | Status: DC
  Administered 2015-05-24 – 2015-05-26 (×6): 500 mg via ORAL
  Filled 2015-05-24 (×6): qty 1

## 2015-05-24 MED ORDER — LORAZEPAM 1 MG PO TABS: 1.0000 mg | ORAL_TABLET | Freq: Four times a day (QID) | ORAL | Status: DC | PRN

## 2015-05-24 MED ORDER — ALBUTEROL SULFATE HFA 108 (90 BASE) MCG/ACT IN AERS: 2.0000 | INHALATION_SPRAY | RESPIRATORY_TRACT | Status: DC | PRN

## 2015-05-24 MED ORDER — THIAMINE HCL 100 MG/ML IJ SOLN: 100.0000 mg | Freq: Every day | INTRAMUSCULAR | Status: DC

## 2015-05-24 MED ORDER — ADULT MULTIVITAMIN W/MINERALS CH
1.0000 | ORAL_TABLET | Freq: Every day | ORAL | Status: DC
  Administered 2015-05-25 – 2015-05-26 (×2): 1 via ORAL
  Filled 2015-05-24 (×2): qty 1

## 2015-05-24 MED ORDER — SODIUM CHLORIDE 0.9% FLUSH
3.0000 mL | Freq: Two times a day (BID) | INTRAVENOUS | Status: DC
  Administered 2015-05-25 – 2015-05-26 (×3): 3 mL via INTRAVENOUS

## 2015-05-24 MED ORDER — DOXYCYCLINE HYCLATE 100 MG IV SOLR
100.0000 mg | Freq: Two times a day (BID) | INTRAVENOUS | Status: DC
  Administered 2015-05-24: 100 mg via INTRAVENOUS
  Filled 2015-05-24 (×2): qty 100

## 2015-05-24 MED ORDER — ASPIRIN EC 81 MG PO TBEC
81.0000 mg | DELAYED_RELEASE_TABLET | Freq: Every day | ORAL | Status: DC
  Administered 2015-05-24 – 2015-05-26 (×3): 81 mg via ORAL
  Filled 2015-05-24 (×3): qty 1

## 2015-05-24 MED ORDER — LORAZEPAM 2 MG/ML IJ SOLN: 1.0000 mg | Freq: Four times a day (QID) | INTRAMUSCULAR | Status: DC | PRN

## 2015-05-24 MED ORDER — IPRATROPIUM-ALBUTEROL 0.5-2.5 (3) MG/3ML IN SOLN
3.0000 mL | Freq: Once | RESPIRATORY_TRACT | Status: AC
  Administered 2015-05-24: 3 mL via RESPIRATORY_TRACT
  Filled 2015-05-24: qty 3

## 2015-05-24 MED ORDER — FOLIC ACID 1 MG PO TABS
1.0000 mg | ORAL_TABLET | Freq: Every day | ORAL | Status: DC
  Administered 2015-05-25 – 2015-05-26 (×2): 1 mg via ORAL
  Filled 2015-05-24 (×2): qty 1

## 2015-05-24 MED ORDER — ALBUTEROL SULFATE HFA 108 (90 BASE) MCG/ACT IN AERS
4.0000 | INHALATION_SPRAY | Freq: Once | RESPIRATORY_TRACT | Status: AC
  Administered 2015-05-24: 4 via RESPIRATORY_TRACT
  Filled 2015-05-24: qty 6.7

## 2015-05-24 MED ORDER — METOPROLOL TARTRATE 12.5 MG HALF TABLET
12.5000 mg | ORAL_TABLET | Freq: Two times a day (BID) | ORAL | Status: DC
  Administered 2015-05-24 – 2015-05-26 (×4): 12.5 mg via ORAL
  Filled 2015-05-24 (×4): qty 1

## 2015-05-24 NOTE — H&P (Signed)
[name] Admission History and Physical Service Pager: [phone]  Patient name: Gregory Lane Medical record number: [id number] Date of birth: 1950/12/19 Age: 65 y.o. Gender: male  Primary Care Provider: No primary care provider on file. Consultants: None Code Status: Full  Chief Complaint: Shortness of Breath  Assessment and Plan: Gregory Lane is a 65 y.o. male presenting with dyspnea. PMH is significant for CAD and CVA in 2008 (per patient), HTN, duodenal perforation status ex-lap and graham patch repair in 04/2015, COPD and 60-paclk-year smoking and EtOH abuse.  Dyspnea/Cough/Increased Sputum Production: likely COPD exacerbation vs CHF exacerbation. Patient with 60 pack-year smoking. Only on albuterol at home. CXR and CTA suggestive for COPD without PE or pneumonia. Exam with globally diminished lung sound but no wheeze or crackles consistent with COPD. He has 2+ Ext edema to knee level which could be fluid overload. However, he is also on Amlodipine at home that could also contribute to this. Orthopnea and Paroxysmal Nocturnal Dyspnea also reported. Chest tightness with dyspnea concerning for ACS but EKG and troponin negative so far. Chest pain atypical. However, HEART score 4. Doubt pneumonia without fever & leukocytosis. CXR negative as well. - Admit to FPTS. Regular floor with telemetry - Prednisone 50 mg daily - Doxycycline 100 mg twice a day - Albuterol 2 puff q4h as needed - Consider sending him home on inhaled steroid - Oxygen for saturation below 90% - Nicotine patches  Edema/Orthopnea/Paroxysmal Nocturnal Dyspnea: CAD and CVA in 2008 per patient. Don't remember the intervention at that time. On lisinopril and metoprolol at home. HEART score 4. EKG and trop negative so far. Weight 235pounds on 3/19, increased to 249.7 today. Denies history of heart failure, however reported symptoms concerning for new onset HF.  - Daily weights - Cycle  troponins - am EKG - BNP - Risk stratification labs (Lipid panel, A1c) - Echo - Consider LE doppler - Continue home meds - Consider lasix  Hypertension: normotensive now - Continue home lisinopril 20 mg, metoprolol 25 mg twice a day, and amlodipine 10 mg once - Monitor BP per floor  Duodenal Perforation s/p ex-lap and graham patch repair on 05/10/2015.  - Will call surgery for FYI and wound vac change. Last change on 04/01 per patient. - Wound care consult - On metronidazole 500 mg three times a day for 10 day course since 03/30. Prescribed by PCP  Tobacco use disorder: denies smoking since he left the hospital.  - Offer nicotine patches 21 daily as needed   EtOH use disorder: last drink three weeks ago. - CIWA  FEN/GI: -Heart healthy diet -Saline lock  Prophylaxis:  -Lovenox  Disposition: Admit to regular floor for COPD exacerbation.   History of Present Illness:  Gregory Lane is a 65 y.o. male presenting with shortness of breath and productive cough with greenish sputum. PMH is significant for CAD and CVA in 2008, HTN, duodenal perforation status post ex-lap and graham patch repair in 04/2015, COPD and 60-pack-year smoking and EtOH abuse.  Patient reports having gradually worsening shortness of breath since he left hospital on 03/28. This morning about 8 am he was sitting and eating his breakfast when he felt tightness in his chest. He used his albuterol which helped a little bit. In the afternoon, when his home nurse arrived, she told him he should go to ED and be evaluated. He denies radiation to his left arm or jaw. Denies diaphoresis. Tightness is over his left lung. He says his  left lung has not been good. States PCP is referring him to a lung specialist for further evaluation. He has baseline orthopnea and sleeps in a recliner due to shortness of breath when lying supine, not changed recently. Also reports sudden awakenings at night gasping for breath.  He also reports  cough. Cough is productive with yellow-green sputum. Denies hemoptysis. Reports leg swelling for one week. Denies calf pain. Denies fever, chills, night sweat, nausea, vomiting & diarrhea. Reports taking 4 of his medication daily (blood pressure, sleep, pain medications and albuterol.   Reports history of CVD and CAD in 2008. States they did not do open heart surgery, but they did do a catheterization. Unsure if they placed stents, stating "I don't know what they did, I just know they put me on disability because of it." Denies history of heart failure. Also reports history of smoking, quitting three weeks ago; reports almost dying from perforated viscus was a wake up call and he wants to straighten up his life. Also reports no alcohol use for 3 weeks.   ED course: CMP normal, CBC normal, poct trop negative, UA neg, EKG normal, CXR with COPD, CTA without PE.  Review Of Systems: Per HPI Otherwise the remainder of the systems were negative.  Patient Active Problem List   Diagnosis Date Noted  . Hypoxia 05/24/2015  . COPD suggested by initial evaluation ([place]) 05/17/2015  . Essential hypertension 05/17/2015  . ETOH abuse 05/17/2015  . Tobacco abuse 05/17/2015  . Medical non-compliance   . Perforated duodenal ulcer ([place]) 05/10/2015    Past Medical History: Past Medical History  Diagnosis Date  . Hypertension   . Stroke ([place])   . Myocardial infarction ([place])   . Arthritis     Past Surgical History: Past Surgical History  Procedure Laterality Date  . Fracture surgery    . Eye surgery    . Laparotomy N/A 05/10/2015    Procedure: EXPLORATORY LAPAROTOMY;  Surgeon: Ralene Ok, MD;  Location: [facility];  Service: General;  Laterality: N/A;  . Exploratory laparotomy  05/12/2015    Social History: Social History  Substance Use Topics  . Smoking status: Current Every Day Smoker -- 1.00 packs/day for 55 years    Types: Cigarettes  . Smokeless tobacco: Never Used  . Alcohol Use: 36.0  oz/week    60 Standard drinks or equivalent per week     Comment: daily 12 pk of beer   Additional social history: see above Please also refer to relevant sections of EMR.  Family History: Family History  Problem Relation Age of Onset  . Cirrhosis Father     Allergies and Medications: No Known Allergies No current facility-administered medications on file prior to encounter.   Current Outpatient Prescriptions on File Prior to Encounter  Medication Sig Dispense Refill  . albuterol (PROVENTIL HFA;VENTOLIN HFA) 108 (90 Base) MCG/ACT inhaler Inhale 2 puffs into the lungs every 6 (six) hours as needed for wheezing or shortness of breath. 1 Inhaler 1  . amLODipine (NORVASC) 10 MG tablet Take 1 tablet (10 mg total) by mouth daily. 30 tablet 0  . HYDROcodone-acetaminophen (NORCO/VICODIN) 5-325 MG tablet Take 1-2 tablets by mouth every 4 (four) hours as needed for moderate pain. 40 tablet 0  . lisinopril (PRINIVIL,ZESTRIL) 20 MG tablet Take 1 tablet (20 mg total) by mouth daily. 30 tablet 0  . metoprolol tartrate (LOPRESSOR) 25 MG tablet Take 0.5 tablets (12.5 mg total) by mouth 2 (two) times daily. 15 tablet 0  .  pantoprazole (PROTONIX) 40 MG tablet Take 1 tablet (40 mg total) by mouth 2 (two) times daily. (Patient taking differently: Take 40 mg by mouth daily. ) 60 tablet 2  . potassium chloride SA (K-DUR,KLOR-CON) 20 MEQ tablet Take 1 tablet (20 mEq total) by mouth 2 (two) times daily. 8 tablet 0  . acetaminophen (TYLENOL) 325 MG tablet Take 2 tablets (650 mg total) by mouth every 6 (six) hours as needed for mild pain, moderate pain, fever or headache. (Patient not taking: Reported on 05/24/2015)    . guaiFENesin (MUCINEX) 600 MG 12 hr tablet You can buy this at any drug store without prescription. (Patient not taking: Reported on 05/24/2015)      Objective: BP 110/93 mmHg  Pulse 74  Temp(Src) 97.9 F (36.6 C) (Oral)  Resp 19  Wt 249 lb 11.2 oz (113.263 kg)  SpO2 90% Exam: Gen: appears  well, sitting on edge of bed without distress Oropharynx: clear, moist, without lesion Neck: supple, no LAD CV: regular rate and rythm. S1 & S2 audible Resp: no apparent work of breathing,  GI: bowel sounds normal, no tenderness to palpation, wound vac in place, clean without sign of inflammation.  GU: no suprapubic tenderness Skin: no lesion MSK: ext edema 2+ Neuro: alert, oriented, motor 5/5 in all exts, no gross deficit.  Labs and Imaging: CBC BMET   Recent Labs Lab 05/24/15 1247  WBC 7.9  HGB 13.6  HCT 40.9  PLT 324    Recent Labs Lab 05/24/15 1247  NA 139  K 4.4  CL 104  CO2 23  BUN 7  CREATININE 0.80  GLUCOSE 96  CALCIUM 8.8*     Dg Chest 2 View  05/24/2015  CLINICAL DATA:  Shortness of breath and cough. EXAM: CHEST  2 VIEW COMPARISON:  05/17/2015. FINDINGS: Mild changes of COPD. No active infiltrates or failure. Course basilar lung markings are stable. Normal cardiomediastinal silhouette. No effusion or pneumothorax. Bones unremarkable. IMPRESSION: No active cardiopulmonary disease.  COPD.  Stable exam. Electronically Signed   By: Staci Righter M.D.   On: 05/24/2015 13:34   Ct Angio Chest Pe W/cm &/or Wo Cm  05/24/2015  CONTRAST:  100 cc Isovue CLINICAL DATA:  Shortness of breath this morning, 2 weeks postop, back pain, chest pain EXAM: CT ANGIOGRAPHY CHEST WITH CONTRAST TECHNIQUE: Multidetector CT imaging of the chest was performed using the standard protocol during bolus administration of intravenous contrast. Multiplanar CT image reconstructions and MIPs were obtained to evaluate the vascular anatomy. COMPARISON:  05/10/2015 images of the lung bases FINDINGS: Mediastinum/Lymph Nodes: Atherosclerotic calcifications of thoracic aorta. Heart size within normal limits. No pleural effusion. There is no mediastinal hematoma or adenopathy. No hilar adenopathy. The study is of excellent technical quality. No pulmonary embolus is noted. Small nonspecific mediastinal lymph nodes  are noted. A right paratracheal lymph node measures 7.8 mm short-axis. AP window lymph node measures 7 mm short-axis. These are not pathologic by size criteria. Lungs/Pleura: Images of the lung parenchyma shows no acute infiltrate or pulmonary edema. Mild hyperinflation. There are breathing motion artifacts. No pleural thickening no pleural effusion. Mild atelectasis noted bilateral lower lobe posteriorly. No bronchiectasis. No pneumothorax. Upper abdomen: There is no evidence of adrenal gland mass in visualized upper abdomen. Visualized liver and pancreas is unremarkable. Trace perisplenic ascites. Musculoskeletal: Sagittal images of the spine shows diffuse osteopenia. Mild degenerative changes lower thoracic spine. Sagittal view of the sternum is unremarkable. Review of the MIP images confirms the above findings. IMPRESSION:  1. No pulmonary embolus is noted. 2. Nonspecific mediastinal lymph nodes are noted not pathologic by size criteria. 3. No infiltrate or pulmonary edema. Mild hyperinflation. Breathing motion artifact are noted. 4. Mild degenerative changes thoracic spine. 5. Mild atherosclerotic calcifications of thoracic aorta. Electronically Signed   By: Lahoma Crocker M.D.   On: 05/24/2015 16:26    Mercy Riding, MD 05/24/2015, 6:48 PM PGY-1, [place] Intern pager: [phone], text pages welcome  Upper Level Addendum:  I have seen and evaluated this patient along with Dr. Cyndia Skeeters and reviewed the above note, making necessary revisions in blue.   Dr. Junie Panning, DO, PGY2 05/24/2015; 9:53 PM

## 2015-05-24 NOTE — ED Notes (Signed)
Verified that tray has already been received.

## 2015-05-24 NOTE — Progress Notes (Signed)
Family Medicine Teaching Service Daily Progress Note Intern Pager: [phone]  Patient name: Gregory Lane Medical record number: [id number] Date of birth: 1950-06-01 Age: 65 y.o. Gender: male  Primary Care Provider: No primary care provider on file. Consultants: None Code Status: FULL  Assessment and Plan: Gregory Lane is a 65 y.o. male presenting with dyspnea. PMH is significant for CAD and CVA in 2008 (per patient), HTN, duodenal perforation status ex-lap and graham patch repair in 04/2015, COPD and 60-pack-year smoking and EtOH abuse.  Dyspnea/Cough/Increased Sputum Production: likely 2/2 COPD exacerbation. CXR and CTA suggestive for COPD without PE or pneumonia. Exam with globally diminished lung sounds but no wheezes or crackles consistent with COPD. Chest tightness with dyspnea concerning for ACS but EKG and troponin negative. HEART score 4. PNA less likely with neg CXR and no fever or leukocytosis. Maintaining O2 sats in high 90s on RA.  - Prednisone 50 mg daily - Doxycycline 100 mg twice a day - Albuterol 2 puff q4h PRN - Start Breo for maintenance - Supplemental O2 for sat  <90%  Edema/Orthopnea/Paroxysmal Nocturnal Dyspnea: CAD and CVA in 2008 per patient. On lisinopril and metoprolol at home. HEART score 4. EKG and trop neg. Weight 235pounds on 3/19, increased to 249.7 on admission. Denies history of heart failure, however reported symptoms concerning for new onset HF. BNP WNL (80.9). Repeat trop neg.  - Daily weights - Risk stratification labs (Lipid panel, A1c) - Echo today - Consider LE doppler - Continue home meds - Consider lasix - AM EKG pending  Hypertension: BP normotensive overnight (136/65, 111/70) overnight.  - Continue home lisinopril 20 mg, metoprolol 25 mg BID, and amlodipine 10 mg   Duodenal Perforation s/p ex-lap and graham patch repair on 05/10/2015. Wound vac in place. - Consider notifying surgery of patient's admission - Wound care to change wound  vac this AM - On metronidazole 500 mg three times a day for 10 day course since 03/30. Prescribed by PCP.  Tobacco use disorder: Denies smoking since last discharge.  - Offer nicotine patches 21 daily as needed   EtOH use disorder: Reports last drink three weeks ago.  - CIWA  FEN/GI: -Heart healthy diet -Saline lock - Protonix  Prophylaxis:  -Lovenox  Disposition: pending medical improvement   Subjective:  Patient reports some difficulty breathing this AM, but says it has improved since yesterday. Primary complain is abdominal pain at site of wound vac, but says this is not new and not worsening.   Objective: Temp:  [97.4 F (36.3 C)-98.5 F (36.9 C)] 97.9 F (36.6 C) (04/04 0514) Pulse Rate:  [59-81] 59 (04/04 0514) Resp:  [15-28] 18 (04/04 0514) BP: (110-162)/(53-93) 111/70 mmHg (04/04 0514) SpO2:  [87 %-97 %] 95 % (04/04 0514) Weight:  [249 lb 11.2 oz (113.263 kg)] 249 lb 11.2 oz (113.263 kg) (04/03 1523) Physical Exam: General: sitting in bedside chair, in NAD Cardiovascular: distant heart sounds, RRR Respiratory: CTAB, breathing comfortably on RA Abdomen: abdominal binder in place Extremities: 1-2+ pitting edema to knee bilaterally  Neuro: A&Ox3, no gross deficits  Laboratory:  Recent Labs Lab 05/24/15 1247 05/25/15 0609  WBC 7.9 6.8  HGB 13.6 13.2  HCT 40.9 38.3*  PLT 324 309    Recent Labs Lab 05/24/15 1247 05/25/15 0609  NA 139 133*  K 4.4 4.1  CL 104 97*  CO2 23 23  BUN 7 12  CREATININE 0.80 0.71  CALCIUM 8.8* 8.6*  PROT 7.1  --   BILITOT 0.8  --  ALKPHOS 71  --   ALT 35  --   AST 18  --   GLUCOSE 96 110*    Imaging/Diagnostic Tests: Dg Chest 2 View 05/24/2015  CLINICAL DATA:  Shortness of breath and cough. EXAM: CHEST  2 VIEW COMPARISON:  05/17/2015. FINDINGS: Mild changes of COPD. No active infiltrates or failure. Course basilar lung markings are stable. Normal cardiomediastinal silhouette. No effusion or pneumothorax. Bones  unremarkable. IMPRESSION: No active cardiopulmonary disease.  COPD.  Stable exam.   Ct Angio Chest Pe W/cm &/or Wo Cm 05/24/2015  CONTRAST:  100 cc Isovue CLINICAL DATA:  Shortness of breath this morning, 2 weeks postop, back pain, chest pain EXAM: CT ANGIOGRAPHY CHEST WITH CONTRAST TECHNIQUE: IMPRESSION: 1. No pulmonary embolus is noted. 2. Nonspecific mediastinal lymph nodes are noted not pathologic by size criteria. 3. No infiltrate or pulmonary edema. Mild hyperinflation. Breathing motion artifact are noted. 4. Mild degenerative changes thoracic spine. 5. Mild atherosclerotic calcifications of thoracic aorta.   Gregory Mould, MD 05/25/2015, 9:42 AM PGY-1, [place] Intern pager: [phone], text pages welcome

## 2015-05-24 NOTE — ED Notes (Signed)
Ordered diet tray 

## 2015-05-24 NOTE — ED Notes (Signed)
Pt 87-89% on RA while ambulating

## 2015-05-24 NOTE — ED Provider Notes (Signed)
CSN: [id number]     Arrival date & time 05/24/15  1201 History   First MD Initiated Contact with Patient 05/24/15 1303     Chief Complaint  Patient presents with  . Shortness of Breath  . Cough  . Hypertension     (Consider location/radiation/quality/duration/timing/severity/associated sxs/prior Treatment) HPI   [name] is a 65 year old male with PMH of HTN, stroke, MI, and recent perforated duodenal ulcer s/p patch repair on 05/10/15. He was admitted form 3/19-3/28 for his perforated ulcer and repair. He was kept on a ventilator overnight post-op and had some issues with BP and pulmonary toilet. He was given albuterol nebulizers and IS during admission. Home O2 was considered, but he did not meet criteria for this after his saturations improved with mobilization. He has home health nursing who visited him today and noted him to be more short of breath and sent him to [facility] ED. Patient says he has chronic SOB, but this worsened since he was admitted to the hospital. He reports cough productive of yellow-green sputum since then as well as bilateral lower extremity edema. He uses albuterol at home which is helpful. He says his breathing was better when on oxygen and now that he is on 2L [place], he feels much better. He denies any fevers, chills, chest pain, nausea, vomiting, diarrhea, constipation, or bleeding/drainage around wound-vac site.   Past Medical History  Diagnosis Date  . Hypertension   . Stroke ([place])   . Myocardial infarction ([place])   . Arthritis    Past Surgical History  Procedure Laterality Date  . Fracture surgery    . Eye surgery    . Laparotomy N/A 05/10/2015    Procedure: EXPLORATORY LAPAROTOMY;  Surgeon: Ralene Ok, MD;  Location: [facility];  Service: General;  Laterality: N/A;  . Exploratory laparotomy  05/12/2015   Family History  Problem Relation Age of Onset  . Cirrhosis Father    Social History  Substance Use Topics  . Smoking status: Current Every  Day Smoker -- 1.00 packs/day for 55 years    Types: Cigarettes  . Smokeless tobacco: Never Used  . Alcohol Use: 36.0 oz/week    60 Standard drinks or equivalent per week     Comment: daily 12 pk of beer    Review of Systems  Constitutional: Negative for fever, chills and diaphoresis.  HENT: Positive for sore throat.   Respiratory: Positive for cough, shortness of breath and wheezing.   Cardiovascular: Positive for leg swelling. Negative for chest pain.  Gastrointestinal: Negative for nausea, vomiting, diarrhea, blood in stool and abdominal distention.  Genitourinary: Positive for hematuria. Negative for dysuria.  Musculoskeletal: Negative for myalgias.  Neurological: Negative for dizziness, syncope, weakness, light-headedness and headaches.      Allergies  Review of patient's allergies indicates no known allergies.  Home Medications   Prior to Admission medications   Medication Sig Start Date End Date Taking? Authorizing Provider  albuterol (PROVENTIL HFA;VENTOLIN HFA) 108 (90 Base) MCG/ACT inhaler Inhale 2 puffs into the lungs every 6 (six) hours as needed for wheezing or shortness of breath. 05/18/15  Yes Earnstine Regal, PA-C  amLODipine (NORVASC) 10 MG tablet Take 1 tablet (10 mg total) by mouth daily. 05/18/15  Yes Earnstine Regal, PA-C  HYDROcodone-acetaminophen (NORCO/VICODIN) 5-325 MG tablet Take 1-2 tablets by mouth every 4 (four) hours as needed for moderate pain. 05/18/15  Yes Earnstine Regal, PA-C  lisinopril (PRINIVIL,ZESTRIL) 20 MG tablet Take 1 tablet (20 mg total) by  mouth daily. 05/18/15  Yes Earnstine Regal, PA-C  metoprolol tartrate (LOPRESSOR) 25 MG tablet Take 0.5 tablets (12.5 mg total) by mouth 2 (two) times daily. 05/18/15  Yes Earnstine Regal, PA-C  metroNIDAZOLE (FLAGYL) 500 MG tablet Take 500 mg by mouth every 8 (eight) hours. 05/20/15  Yes Historical Provider, MD  Multiple Vitamin (MULTIVITAMIN) tablet Take 1 tablet by mouth daily.   Yes Historical  Provider, MD  naproxen sodium (ANAPROX) 220 MG tablet Take 220 mg by mouth 2 (two) times daily as needed (pain).   Yes Historical Provider, MD  pantoprazole (PROTONIX) 40 MG tablet Take 1 tablet (40 mg total) by mouth 2 (two) times daily. Patient taking differently: Take 40 mg by mouth daily.  05/18/15  Yes Earnstine Regal, PA-C  potassium chloride SA (K-DUR,KLOR-CON) 20 MEQ tablet Take 1 tablet (20 mEq total) by mouth 2 (two) times daily. 05/18/15  Yes Earnstine Regal, PA-C  acetaminophen (TYLENOL) 325 MG tablet Take 2 tablets (650 mg total) by mouth every 6 (six) hours as needed for mild pain, moderate pain, fever or headache. Patient not taking: Reported on 05/24/2015 05/18/15   Earnstine Regal, PA-C  guaiFENesin (MUCINEX) 600 MG 12 hr tablet You can buy this at any drug store without prescription. Patient not taking: Reported on 05/24/2015 05/18/15   Earnstine Regal, PA-C   BP 145/73 mmHg  Pulse 66  Temp(Src) 97.4 F (36.3 C) (Oral)  Resp 19  Wt 113.263 kg  SpO2 97% Physical Exam  Constitutional: He is oriented to person, place, and time. He appears well-developed and well-nourished. No distress.  Obese male, reclined in bed, on 2L [place]  HENT:  Head: Normocephalic and atraumatic.  Mouth/Throat: Oropharynx is clear and moist.  Eyes: Pupils are equal, round, and reactive to light.  Cardiovascular:  Distant heart sounds, RRR, no murmur appreciated  Pulmonary/Chest: Effort normal. No respiratory distress. He has no rales. He exhibits no tenderness.  Distant breath sounds, mild wheezing left lung field  Abdominal: Soft. Bowel sounds are normal. He exhibits no distension.  Wound vac in place at surgical site midline abdomen. Slight tenderness to palpation near surgical site, otherwise non-tender.   Musculoskeletal:  +3 pitting edema bilateral lower extremties  Neurological: He is alert and oriented to person, place, and time.  Skin: Skin is warm.  Wound vac site clean, dry, intact    ED  Course  Procedures (including critical care time) Labs Review Labs Reviewed  COMPREHENSIVE METABOLIC PANEL - Abnormal; Notable for the following:    Calcium 8.8 (*)    Albumin 3.2 (*)    All other components within normal limits  CBC WITH DIFFERENTIAL/PLATELET - Abnormal; Notable for the following:    RBC 3.98 (*)    MCV 102.8 (*)    MCH 34.2 (*)    All other components within normal limits  URINALYSIS, ROUTINE W REFLEX MICROSCOPIC (NOT AT [facility])  I-STAT CG4 LACTIC ACID, ED  Randolm Idol, ED  I-STAT CG4 LACTIC ACID, ED    Imaging Review Dg Chest 2 View  05/24/2015  CLINICAL DATA:  Shortness of breath and cough. EXAM: CHEST  2 VIEW COMPARISON:  05/17/2015. FINDINGS: Mild changes of COPD. No active infiltrates or failure. Course basilar lung markings are stable. Normal cardiomediastinal silhouette. No effusion or pneumothorax. Bones unremarkable. IMPRESSION: No active cardiopulmonary disease.  COPD.  Stable exam. Electronically Signed   By: Staci Righter M.D.   On: 05/24/2015 13:34   I have personally reviewed and evaluated these images and lab  results as part of my medical decision-making.   EKG Interpretation None      MDM   Final diagnoses:  None   65 year old male with PMH of HTN, stroke, MI, and recent perforated duodenal ulcer s/p patch repair on 05/10/15 presenting with worsened SOB, productive cough, and oxygen need. His wound vac and abdominal exam appear stable. Patient with significant smoking history and likely has underlying COPD with probable bronchitis. However, patient is 2 weeks post-op so there is concern for pulmonary embolism. CXR without obvious infiltration or consolidation. Will obtain CTA chest to assess for PE and infection.  Patient currently afebrile, without leukocytosis, lactic acidosis, or tachycardia. Will ambulate patient on room air. If CTA negative and patient saturating at least 90% on room air, would consider discharging home with short course  antibiotics for bronchitis and metered dose inhaler.  Discussed case with Senior Resident who will take over care.    Zada Finders, MD 05/24/15 1557  Elnora Morrison, MD 05/30/15 [phone]

## 2015-05-24 NOTE — ED Provider Notes (Signed)
Assumption of Care Note:  I assumed care of this patient at 16:00 from Dr. Posey Pronto, please see his note for more detail. Briefly, this pt is a 65 year old male with recent surgery for perforated duodenal ulcer who presents with 1 week of worsening shortness of breath and cough productive of green sputum. CTA chest negative for pulmonary embolus or infection. After breathing treatment and steroids patient continues to be hypoxic and required 2 L of oxygen to maintain oxygen saturations while ambulating. Patient does not have oxygen at home. Patient is admitted for likely bronchitis with hypoxia. Patient and wife express understanding and agreement with this plan.   Gregory Loveless, MD 05/24/15 Gregory Lane  Noemi Chapel, MD 05/25/15 1330

## 2015-05-24 NOTE — ED Notes (Signed)
Patient transported to CT 

## 2015-05-24 NOTE — ED Notes (Signed)
Pt sent here by home health RN. Pt was admitted on 3/20 for perforated duodenal ulcer, has wound vac in place. Pt reports being sob and having a cough since being discharged. But now also has swelling to bilateral legs. spo2 91% on room air at triage.

## 2015-05-24 NOTE — ED Notes (Signed)
Ambulated pt on RA.  Pt tolerated this well but spo2 dropped to 87% on RA, pt requests admission

## 2015-05-24 NOTE — ED Notes (Signed)
Gave patient ice chips, per Dr. Reather Converse

## 2015-05-24 NOTE — ED Notes (Signed)
Patient placed in chair for comfort.

## 2015-05-24 NOTE — ED Notes (Signed)
Ambulated pt on RA after breathing treatments.  Pt tolerated this but spo2 dropped to 87% on

## 2015-05-25 ENCOUNTER — Inpatient Hospital Stay (HOSPITAL_COMMUNITY): Payer: Medicare HMO

## 2015-05-25 ENCOUNTER — Encounter (HOSPITAL_COMMUNITY): Payer: Medicare HMO

## 2015-05-25 DIAGNOSIS — R0602 Shortness of breath: Secondary | ICD-10-CM

## 2015-05-25 DIAGNOSIS — R058 Other specified cough: Secondary | ICD-10-CM | POA: Diagnosis present

## 2015-05-25 DIAGNOSIS — R0600 Dyspnea, unspecified: Secondary | ICD-10-CM

## 2015-05-25 DIAGNOSIS — R600 Localized edema: Secondary | ICD-10-CM

## 2015-05-25 DIAGNOSIS — R05 Cough: Secondary | ICD-10-CM

## 2015-05-25 DIAGNOSIS — J441 Chronic obstructive pulmonary disease with (acute) exacerbation: Secondary | ICD-10-CM | POA: Diagnosis present

## 2015-05-25 DIAGNOSIS — R0902 Hypoxemia: Secondary | ICD-10-CM

## 2015-05-25 LAB — CBC
HEMATOCRIT: 38.3 % — AB (ref 39.0–52.0)
HEMOGLOBIN: 13.2 g/dL (ref 13.0–17.0)
MCH: 35.2 pg — AB (ref 26.0–34.0)
MCHC: 34.5 g/dL (ref 30.0–36.0)
MCV: 102.1 fL — AB (ref 78.0–100.0)
Platelets: 309 10*3/uL (ref 150–400)
RBC: 3.75 MIL/uL — ABNORMAL LOW (ref 4.22–5.81)
RDW: 14.2 % (ref 11.5–15.5)
WBC: 6.8 10*3/uL (ref 4.0–10.5)

## 2015-05-25 LAB — ECHOCARDIOGRAM COMPLETE: Weight: 3995.2 oz

## 2015-05-25 LAB — TROPONIN I
Troponin I: <0.03 ng/mL (ref <0.031)
Troponin I: <0.03 ng/mL (ref <0.031)

## 2015-05-25 LAB — LIPID PANEL
CHOLESTEROL: 177 mg/dL (ref 0–200)
HDL: 33 mg/dL — AB (ref >40)
LDL CALC: 127 mg/dL — AB (ref 0–99)
TRIGLYCERIDES: 84 mg/dL (ref <150)
Total CHOL/HDL Ratio: 5.4 RATIO
VLDL: 17 mg/dL (ref 0–40)

## 2015-05-25 LAB — BASIC METABOLIC PANEL
ANION GAP: 13 (ref 5–15)
BUN: 12 mg/dL (ref 6–20)
CO2: 23 mmol/L (ref 22–32)
Calcium: 8.6 mg/dL — ABNORMAL LOW (ref 8.9–10.3)
Chloride: 97 mmol/L — ABNORMAL LOW (ref 101–111)
Creatinine, Ser: 0.71 mg/dL (ref 0.61–1.24)
GFR calc Af Amer: >60 mL/min (ref >60)
GLUCOSE: 110 mg/dL — AB (ref 65–99)
POTASSIUM: 4.1 mmol/L (ref 3.5–5.1)
Sodium: 133 mmol/L — ABNORMAL LOW (ref 135–145)

## 2015-05-25 LAB — BRAIN NATRIURETIC PEPTIDE: B NATRIURETIC PEPTIDE 5: 80.9 pg/mL (ref 0.0–100.0)

## 2015-05-25 MED ORDER — DOXYCYCLINE HYCLATE 100 MG PO TABS
100.0000 mg | ORAL_TABLET | Freq: Two times a day (BID) | ORAL | Status: DC
  Administered 2015-05-25 – 2015-05-26 (×3): 100 mg via ORAL
  Filled 2015-05-25 (×3): qty 1

## 2015-05-25 MED ORDER — FLUTICASONE FUROATE-VILANTEROL 100-25 MCG/INH IN AEPB
1.0000 | INHALATION_SPRAY | Freq: Every day | RESPIRATORY_TRACT | Status: DC
  Filled 2015-05-25: qty 28

## 2015-05-25 MED ORDER — PANTOPRAZOLE SODIUM 40 MG PO TBEC
40.0000 mg | DELAYED_RELEASE_TABLET | Freq: Every day | ORAL | Status: DC
  Administered 2015-05-25 – 2015-05-26 (×2): 40 mg via ORAL
  Filled 2015-05-25: qty 1

## 2015-05-25 NOTE — Consult Note (Signed)
WOC wound consult note Reason for Consult: Consult requested for abd wound.  Pt is followed by Ken Caryl prior to admission and uses a Medella negative pressure machine from home, which is intact at the bedside with charger. Wound type: Full thickness post-op wound; pt is followed by surgical team as an outpatient Measurement:22X3.5X.4cm Wound bed: beefy red Drainage (amount, consistency, odor) Small amt tan drainage, no odor Periwound: Intact skin surrounding Dressing procedure/placement/frequency: Applied one piece black foam to 155m cont suction.  Pt thinks they will be discharged today.  Medella pump left intact with good seal, applied adapter piece so KCI dressing can be connected to the [place] and taped the connection.  Phone number provided on the pump for staff nurses to call if troubleshooting assistance is needed from [facility]; [phone] discussed plan of care with their team. Please re-consult if further assistance is needed.  Thank-you,  DJulien GirtMSN, [place]

## 2015-05-25 NOTE — Progress Notes (Signed)
Patient arrived on unit via recliner with ED RN. Patient alert and oriented x4. Patient oriented to room, staff and unit. Patient placed on telemetry, [place] notified. Skin assessment completed with Hilbert Corrigan, check flowsheets. Patient's IV infiltrated, IV team consult placed. Patient denies pain. Safety Fall Prevention Plan was given, discussed and signed by patient. Orders have been reviewed and implemented.Call light has been placed within reach. RN will continue to monitor patient.   Nena Polio BSN, RN  Phone Number: [phone]

## 2015-05-25 NOTE — Progress Notes (Signed)
[facility]  Patient Status: Active (receiving services up to time of hospitalization)  [facility] is providing the following services: RN, PT and OT  If patient discharges after hours, please call [phone].   Gregory Lane 05/25/2015, 11:41 AM

## 2015-05-25 NOTE — Progress Notes (Signed)
  Echocardiogram 2D Echocardiogram has been performed.  Gregory Lane 05/25/2015, 4:05 PM

## 2015-05-26 ENCOUNTER — Other Ambulatory Visit: Payer: Self-pay | Admitting: Student

## 2015-05-26 ENCOUNTER — Encounter (HOSPITAL_COMMUNITY): Payer: Medicare HMO

## 2015-05-26 DIAGNOSIS — Z4889 Encounter for other specified surgical aftercare: Secondary | ICD-10-CM

## 2015-05-26 LAB — HEMOGLOBIN A1C
Hgb A1c MFr Bld: 5.5 % (ref 4.8–5.6)
Mean Plasma Glucose: 111 mg/dL

## 2015-05-26 MED ORDER — DOXYCYCLINE HYCLATE 100 MG PO TABS
100.0000 mg | ORAL_TABLET | Freq: Two times a day (BID) | ORAL | Status: DC
Start: 2015-05-26 — End: 2015-05-26

## 2015-05-26 MED ORDER — DOXYCYCLINE HYCLATE 100 MG PO TABS: 100.0000 mg | ORAL_TABLET | Freq: Two times a day (BID) | ORAL | Status: AC

## 2015-05-26 MED ORDER — FLUTICASONE FUROATE-VILANTEROL 100-25 MCG/INH IN AEPB: 1.0000 | INHALATION_SPRAY | Freq: Every day | RESPIRATORY_TRACT | Status: DC

## 2015-05-26 MED ORDER — PREDNISONE 50 MG PO TABS: 50.0000 mg | ORAL_TABLET | Freq: Every day | ORAL | Status: DC

## 2015-05-26 NOTE — Progress Notes (Signed)
Gregory Lane to be D/C'd Home per MD order.  Discussed prescriptions and follow up appointments with the patient. Prescriptions given to patient, medication list explained in detail. Pt verbalized understanding.    Medication List    STOP taking these medications        guaiFENesin 600 MG 12 hr tablet  Commonly known as:  MUCINEX      TAKE these medications        acetaminophen 325 MG tablet  Commonly known as:  TYLENOL  Take 2 tablets (650 mg total) by mouth every 6 (six) hours as needed for mild pain, moderate pain, fever or headache.     albuterol 108 (90 Base) MCG/ACT inhaler  Commonly known as:  PROVENTIL HFA;VENTOLIN HFA  Inhale 2 puffs into the lungs every 6 (six) hours as needed for wheezing or shortness of breath.     amLODipine 10 MG tablet  Commonly known as:  NORVASC  Take 1 tablet (10 mg total) by mouth daily.     doxycycline 100 MG tablet  Commonly known as:  VIBRA-TABS  Take 1 tablet (100 mg total) by mouth every 12 (twelve) hours.     fluticasone furoate-vilanterol 100-25 MCG/INH Aepb  Commonly known as:  BREO ELLIPTA  Inhale 1 puff into the lungs daily.     HYDROcodone-acetaminophen 5-325 MG tablet  Commonly known as:  NORCO/VICODIN  Take 1-2 tablets by mouth every 4 (four) hours as needed for moderate pain.     lisinopril 20 MG tablet  Commonly known as:  PRINIVIL,ZESTRIL  Take 1 tablet (20 mg total) by mouth daily.     metoprolol tartrate 25 MG tablet  Commonly known as:  LOPRESSOR  Take 0.5 tablets (12.5 mg total) by mouth 2 (two) times daily.     metroNIDAZOLE 500 MG tablet  Commonly known as:  FLAGYL  Take 500 mg by mouth every 8 (eight) hours.     multivitamin tablet  Take 1 tablet by mouth daily.     naproxen sodium 220 MG tablet  Commonly known as:  ANAPROX  Take 220 mg by mouth 2 (two) times daily as needed (pain).     pantoprazole 40 MG tablet  Commonly known as:  PROTONIX  Take 1 tablet (40 mg total) by mouth 2 (two) times  daily.     potassium chloride SA 20 MEQ tablet  Commonly known as:  K-DUR,KLOR-CON  Take 1 tablet (20 mEq total) by mouth 2 (two) times daily.     predniSONE 50 MG tablet  Commonly known as:  DELTASONE  Take 1 tablet (50 mg total) by mouth daily with breakfast.        Filed Vitals:   05/26/15 0845 05/26/15 0941  BP: 127/62 107/55  Pulse: 55 65  Temp: 98 F (36.7 C) 98.4 F (36.9 C)  Resp: 18     Skin clean, dry and intact without evidence of skin break down, no evidence of skin tears noted. IV catheter discontinued intact. Site without signs and symptoms of complications. Dressing and pressure applied. Pt denies pain at this time. No complaints noted.  An After Visit Summary was printed and given to the patient. Patient escorted via [place], and D/C home via private auto.  Gregory Lane BSN, RN

## 2015-05-26 NOTE — Discharge Instructions (Signed)
You were admitted for trouble breathing, most likely due to a COPD exacerbation (worsening of your COPD). To treat this, we started you on an antibiotic, steroids, and a new inhaler.   Please continue taking the antibiotics (doxycycline) twice a day for the next 3 days (last dose in the evening on 05/29/15). Also continue to take the steroids (prednisone) once a day with breakfast for the next three days (last dose in the morning on 05/29/15).   Please continue to use the new inhaler [place]) for your COPD. Inhale one puff daily whether you are having trouble breathing or not. You can also use your albuterol inhaler as needed for wheezing.   It is important to schedule a hospital follow-up appointment with your regular doctor as soon as possible.

## 2015-05-26 NOTE — Progress Notes (Signed)
Family Medicine Teaching Service Daily Progress Note Intern Pager: [phone]  Patient name: Gregory Lane Medical record number: [id number] Date of birth: Apr 06, 1950 Age: 65 y.o. Gender: male  Primary Care Provider: No primary care provider on file. Consultants: None Code Status: FULL  Assessment and Plan: Gregory Lane is a 65 y.o. male presenting with dyspnea. PMH is significant for CAD and CVA in 2008 (per patient), HTN, duodenal perforation status ex-lap and graham patch repair in 04/2015, COPD and 60-pack-year smoking and EtOH abuse.  Dyspnea/Cough/Increased Sputum Production: likely 2/2 COPD exacerbation. CXR and CTA suggestive for COPD without PE or pneumonia. Exam with globally diminished lung sounds but no wheezes or crackles consistent with COPD. Chest tightness with dyspnea concerning for ACS but EKG and troponin negative. HEART score 4. PNA less likely with neg CXR and no fever or leukocytosis. Maintaining O2 sats in high 90s on RA.  - Prednisone 50 mg daily - Doxycycline 100 mg twice a day - Albuterol 2 puff q4h PRN - Breo for maintenance - Supplemental O2 for sat  <90% - As respiratory status is improving, likely discharge today   Edema/Orthopnea/Paroxysmal Nocturnal Dyspnea: CAD and CVA in 2008 per patient. On lisinopril and metoprolol at home. HEART score 4. EKG and trop neg. Weight 235pounds on 3/19, increased to 249.7 on admission. Denies history of heart failure, however reported symptoms concerning for new onset HF. BNP WNL (80.9). Lipid panel with slightly elevated LDL and low HDL. A1C 5.5. Echo with EF 55-60% and grade 1 diastolic dysfunction.  - Daily weights - Continue home meds  Hypertension: BP normotensive for patient overnight (132/64, 140/60) overnight.  - Continue home lisinopril 20 mg, metoprolol 25 mg BID, and amlodipine 10 mg   Duodenal Perforation s/p ex-lap and graham patch repair on 05/10/2015. Wound vac changed yesterday (04/05) by wound care team.   - On metronidazole 500 mg three times a day for 10 day course since 03/30. Prescribed by PCP. - Continue wound care with [place] on discharge  Tobacco use disorder: Denies smoking since last discharge.  - Offer nicotine patches 21 daily as needed   EtOH use disorder: Reports last drink three weeks ago.  - CIWA  FEN/GI: -Heart healthy diet -Saline lock - Protonix  Prophylaxis:  -Lovenox  Disposition: likely home today  Subjective:  Patient with no complaints this AM. Denies respiratory difficulties. Is ready to go home today.   Objective: Temp:  [97.7 F (36.5 C)-98.6 F (37 C)] 97.8 F (36.6 C) (04/05 0513) Pulse Rate:  [55-69] 55 (04/05 0513) Resp:  [16-18] 16 (04/05 0513) BP: (131-152)/(56-64) 140/60 mmHg (04/05 0513) SpO2:  [90 %-98 %] 98 % (04/05 0513) Physical Exam: General: sitting in bedside chair eating breakfast, in NAD Cardiovascular: distant heart sounds, RRR Respiratory: CTAB, breathing comfortably on RA Abdomen: wound vac in place Extremities: 1-2+ pitting edema to knee bilaterally  Neuro: A&Ox3, no gross deficits  Laboratory:  Recent Labs Lab 05/24/15 1247 05/25/15 0609  WBC 7.9 6.8  HGB 13.6 13.2  HCT 40.9 38.3*  PLT 324 309    Recent Labs Lab 05/24/15 1247 05/25/15 0609  NA 139 133*  K 4.4 4.1  CL 104 97*  CO2 23 23  BUN 7 12  CREATININE 0.80 0.71  CALCIUM 8.8* 8.6*  PROT 7.1  --   BILITOT 0.8  --   ALKPHOS 71  --   ALT 35  --   AST 18  --   GLUCOSE 96 110*  Imaging/Diagnostic Tests: Dg Chest 2 View 05/24/2015  CLINICAL DATA:  Shortness of breath and cough. EXAM: CHEST  2 VIEW COMPARISON:  05/17/2015. FINDINGS: Mild changes of COPD. No active infiltrates or failure. Course basilar lung markings are stable. Normal cardiomediastinal silhouette. No effusion or pneumothorax. Bones unremarkable. IMPRESSION: No active cardiopulmonary disease.  COPD.  Stable exam.   Ct Angio Chest Pe W/cm &/or Wo Cm 05/24/2015  CONTRAST:   100 cc Isovue CLINICAL DATA:  Shortness of breath this morning, 2 weeks postop, back pain, chest pain EXAM: CT ANGIOGRAPHY CHEST WITH CONTRAST TECHNIQUE: IMPRESSION: 1. No pulmonary embolus is noted. 2. Nonspecific mediastinal lymph nodes are noted not pathologic by size criteria. 3. No infiltrate or pulmonary edema. Mild hyperinflation. Breathing motion artifact are noted. 4. Mild degenerative changes thoracic spine. 5. Mild atherosclerotic calcifications of thoracic aorta.   Verner Mould, MD 05/26/2015, 9:29 AM PGY-1, [place] Intern pager: [phone], text pages welcome

## 2015-05-28 DIAGNOSIS — I252 Old myocardial infarction: Secondary | ICD-10-CM | POA: Diagnosis not present

## 2015-05-28 DIAGNOSIS — Z720 Tobacco use: Secondary | ICD-10-CM | POA: Diagnosis not present

## 2015-05-28 DIAGNOSIS — I10 Essential (primary) hypertension: Secondary | ICD-10-CM | POA: Diagnosis not present

## 2015-05-28 DIAGNOSIS — Z48815 Encounter for surgical aftercare following surgery on the digestive system: Secondary | ICD-10-CM | POA: Diagnosis not present

## 2015-05-28 DIAGNOSIS — Z8673 Personal history of transient ischemic attack (TIA), and cerebral infarction without residual deficits: Secondary | ICD-10-CM | POA: Diagnosis not present

## 2015-05-29 NOTE — Discharge Summary (Signed)
[facility] Discharge Summary  Patient name: Gregory Lane Medical record number: [id number] Date of birth: August 10, 1950 Age: 65 y.o. Gender: male Date of Admission: 05/24/2015  Date of Discharge: 05/26/2015 Admitting Physician: Leeanne Rio, MD  Primary Care Provider: No primary care provider on file. Consultants: none  Indication for Hospitalization: dyspnea  Discharge Diagnoses/Problem List:  Patient Active Problem List   Diagnosis Date Noted  . Productive cough   . Bilateral edema of lower extremity   . COPD exacerbation ([place])   . Hypoxia 05/24/2015  . SOB (shortness of breath) 05/24/2015  . COPD suggested by initial evaluation ([place]) 05/17/2015  . Essential hypertension 05/17/2015  . ETOH abuse 05/17/2015  . Tobacco abuse 05/17/2015  . Medical non-compliance   . Perforated duodenal ulcer ([place]) 05/10/2015    Disposition: home  Discharge Condition: stable  Discharge Exam:  General: sitting in bedside chair eating breakfast, in NAD Cardiovascular: distant heart sounds, RRR Respiratory: CTAB, breathing comfortably on RA Abdomen: wound vac in place Extremities: 1-2+ pitting edema to knee bilaterally  Neuro: A&Ox3, no gross deficits  Brief Hospital Course:  Patient was admitted for worsening dyspnea 2/2 to COPD exacerbation with concern for subsequent new onset CHF.  Dyspnea Patient with history of COPD and symptoms consistent with exacerbation (dyspnea, cough, sputum production). CXR and CTA also supported diagnosis and were not consistent with PE or PNA. Patient was started on prednisone, doxycycline, and albuterol PRN for management. He maintained adequate O2 sats on RA throughout admission. He was started on Breo prior to discharge, as he was not previously prescribed any maintenance meds.   Edema/Orthopnea/PND Patient also endorsed LE edema, orthopnea, and paroxysmal nocturnal dyspnea on arrival. Those symptoms as well as worsening  dyspnea were concerning for new onset CHF. BNP was WNL. Echo was performed, which showed EF of 55-60% and grade 1 diastolic dysfunction. Given relatively non-concerning echo, further work-up was not performed.  Issues for Follow Up:  1. [facility] to continue following patient for wound vac management.  2. Patient to complete 5 day course of prednisone and doxycycline for COPD exacerbation.  3. Patient started on Breo for COPD maintenance.  4. Patient to complete 10 day course of metronidazole prescribed by PCP (last dose 05/29/15) after duodenal performation.   Significant Procedures: none  Significant Labs and Imaging:   Recent Labs Lab 05/24/15 1247 05/25/15 0609  WBC 7.9 6.8  HGB 13.6 13.2  HCT 40.9 38.3*  PLT 324 309    Recent Labs Lab 05/24/15 1247 05/25/15 0609  NA 139 133*  K 4.4 4.1  CL 104 97*  CO2 23 23  GLUCOSE 96 110*  BUN 7 12  CREATININE 0.80 0.71  CALCIUM 8.8* 8.6*  ALKPHOS 71  --   AST 18  --   ALT 35  --   ALBUMIN 3.2*  --    Results/Tests Pending at Time of Discharge: none  Discharge Medications:    Medication List    STOP taking these medications        guaiFENesin 600 MG 12 hr tablet  Commonly known as:  MUCINEX      TAKE these medications        acetaminophen 325 MG tablet  Commonly known as:  TYLENOL  Take 2 tablets (650 mg total) by mouth every 6 (six) hours as needed for mild pain, moderate pain, fever or headache.     albuterol 108 (90 Base) MCG/ACT inhaler  Commonly known as:  PROVENTIL HFA;VENTOLIN HFA  Inhale 2 puffs into the lungs every 6 (six) hours as needed for wheezing or shortness of breath.     amLODipine 10 MG tablet  Commonly known as:  NORVASC  Take 1 tablet (10 mg total) by mouth daily.     doxycycline 100 MG tablet  Commonly known as:  VIBRA-TABS  Take 1 tablet (100 mg total) by mouth every 12 (twelve) hours.     fluticasone furoate-vilanterol 100-25 MCG/INH Aepb  Commonly known as:  BREO ELLIPTA   Inhale 1 puff into the lungs daily.     HYDROcodone-acetaminophen 5-325 MG tablet  Commonly known as:  NORCO/VICODIN  Take 1-2 tablets by mouth every 4 (four) hours as needed for moderate pain.     lisinopril 20 MG tablet  Commonly known as:  PRINIVIL,ZESTRIL  Take 1 tablet (20 mg total) by mouth daily.     metoprolol tartrate 25 MG tablet  Commonly known as:  LOPRESSOR  Take 0.5 tablets (12.5 mg total) by mouth 2 (two) times daily.     metroNIDAZOLE 500 MG tablet  Commonly known as:  FLAGYL  Take 500 mg by mouth every 8 (eight) hours.     multivitamin tablet  Take 1 tablet by mouth daily.     naproxen sodium 220 MG tablet  Commonly known as:  ANAPROX  Take 220 mg by mouth 2 (two) times daily as needed (pain).     pantoprazole 40 MG tablet  Commonly known as:  PROTONIX  Take 1 tablet (40 mg total) by mouth 2 (two) times daily.     potassium chloride SA 20 MEQ tablet  Commonly known as:  K-DUR,KLOR-CON  Take 1 tablet (20 mEq total) by mouth 2 (two) times daily.     predniSONE 50 MG tablet  Commonly known as:  DELTASONE  Take 1 tablet (50 mg total) by mouth daily with breakfast.        Discharge Instructions: Please refer to Patient Instructions section of EMR for full details.  Patient was counseled important signs and symptoms that should prompt return to medical care, changes in medications, dietary instructions, activity restrictions, and follow up appointments.   Follow-Up Appointments:     Follow-up Information    Schedule an appointment as soon as possible for a visit in 1 week to follow up.   Why:  For hospital follow-up      Verner Mould, MD 05/29/2015, 11:54 AM PGY-1, [place]

## 2015-05-31 DIAGNOSIS — I10 Essential (primary) hypertension: Secondary | ICD-10-CM | POA: Diagnosis not present

## 2015-05-31 DIAGNOSIS — Z720 Tobacco use: Secondary | ICD-10-CM | POA: Diagnosis not present

## 2015-05-31 DIAGNOSIS — Z48815 Encounter for surgical aftercare following surgery on the digestive system: Secondary | ICD-10-CM | POA: Diagnosis not present

## 2015-05-31 DIAGNOSIS — Z8673 Personal history of transient ischemic attack (TIA), and cerebral infarction without residual deficits: Secondary | ICD-10-CM | POA: Diagnosis not present

## 2015-05-31 DIAGNOSIS — I252 Old myocardial infarction: Secondary | ICD-10-CM | POA: Diagnosis not present

## 2015-06-01 DIAGNOSIS — I252 Old myocardial infarction: Secondary | ICD-10-CM | POA: Diagnosis not present

## 2015-06-01 DIAGNOSIS — I10 Essential (primary) hypertension: Secondary | ICD-10-CM | POA: Diagnosis not present

## 2015-06-01 DIAGNOSIS — Z48815 Encounter for surgical aftercare following surgery on the digestive system: Secondary | ICD-10-CM | POA: Diagnosis not present

## 2015-06-01 DIAGNOSIS — Z8673 Personal history of transient ischemic attack (TIA), and cerebral infarction without residual deficits: Secondary | ICD-10-CM | POA: Diagnosis not present

## 2015-06-01 DIAGNOSIS — Z720 Tobacco use: Secondary | ICD-10-CM | POA: Diagnosis not present

## 2015-06-02 DIAGNOSIS — E559 Vitamin D deficiency, unspecified: Secondary | ICD-10-CM | POA: Diagnosis not present

## 2015-06-02 DIAGNOSIS — Z48815 Encounter for surgical aftercare following surgery on the digestive system: Secondary | ICD-10-CM | POA: Diagnosis not present

## 2015-06-02 DIAGNOSIS — R5381 Other malaise: Secondary | ICD-10-CM | POA: Diagnosis not present

## 2015-06-02 DIAGNOSIS — Z720 Tobacco use: Secondary | ICD-10-CM | POA: Diagnosis not present

## 2015-06-02 DIAGNOSIS — Z8673 Personal history of transient ischemic attack (TIA), and cerebral infarction without residual deficits: Secondary | ICD-10-CM | POA: Diagnosis not present

## 2015-06-02 DIAGNOSIS — I10 Essential (primary) hypertension: Secondary | ICD-10-CM | POA: Diagnosis not present

## 2015-06-02 DIAGNOSIS — K279 Peptic ulcer, site unspecified, unspecified as acute or chronic, without hemorrhage or perforation: Secondary | ICD-10-CM | POA: Diagnosis not present

## 2015-06-02 DIAGNOSIS — R945 Abnormal results of liver function studies: Secondary | ICD-10-CM | POA: Diagnosis not present

## 2015-06-02 DIAGNOSIS — I252 Old myocardial infarction: Secondary | ICD-10-CM | POA: Diagnosis not present

## 2015-06-02 DIAGNOSIS — E785 Hyperlipidemia, unspecified: Secondary | ICD-10-CM | POA: Diagnosis not present

## 2015-06-02 DIAGNOSIS — I2510 Atherosclerotic heart disease of native coronary artery without angina pectoris: Secondary | ICD-10-CM | POA: Diagnosis not present

## 2015-06-04 DIAGNOSIS — Z720 Tobacco use: Secondary | ICD-10-CM | POA: Diagnosis not present

## 2015-06-04 DIAGNOSIS — Z48815 Encounter for surgical aftercare following surgery on the digestive system: Secondary | ICD-10-CM | POA: Diagnosis not present

## 2015-06-04 DIAGNOSIS — Z8673 Personal history of transient ischemic attack (TIA), and cerebral infarction without residual deficits: Secondary | ICD-10-CM | POA: Diagnosis not present

## 2015-06-04 DIAGNOSIS — I10 Essential (primary) hypertension: Secondary | ICD-10-CM | POA: Diagnosis not present

## 2015-06-04 DIAGNOSIS — I252 Old myocardial infarction: Secondary | ICD-10-CM | POA: Diagnosis not present

## 2015-06-07 DIAGNOSIS — I252 Old myocardial infarction: Secondary | ICD-10-CM | POA: Diagnosis not present

## 2015-06-07 DIAGNOSIS — Z720 Tobacco use: Secondary | ICD-10-CM | POA: Diagnosis not present

## 2015-06-07 DIAGNOSIS — I10 Essential (primary) hypertension: Secondary | ICD-10-CM | POA: Diagnosis not present

## 2015-06-07 DIAGNOSIS — Z8673 Personal history of transient ischemic attack (TIA), and cerebral infarction without residual deficits: Secondary | ICD-10-CM | POA: Diagnosis not present

## 2015-06-07 DIAGNOSIS — Z48815 Encounter for surgical aftercare following surgery on the digestive system: Secondary | ICD-10-CM | POA: Diagnosis not present

## 2015-06-08 DIAGNOSIS — I10 Essential (primary) hypertension: Secondary | ICD-10-CM | POA: Diagnosis not present

## 2015-06-08 DIAGNOSIS — Z720 Tobacco use: Secondary | ICD-10-CM | POA: Diagnosis not present

## 2015-06-08 DIAGNOSIS — Z48815 Encounter for surgical aftercare following surgery on the digestive system: Secondary | ICD-10-CM | POA: Diagnosis not present

## 2015-06-08 DIAGNOSIS — I252 Old myocardial infarction: Secondary | ICD-10-CM | POA: Diagnosis not present

## 2015-06-08 DIAGNOSIS — Z8673 Personal history of transient ischemic attack (TIA), and cerebral infarction without residual deficits: Secondary | ICD-10-CM | POA: Diagnosis not present

## 2015-06-09 DIAGNOSIS — I10 Essential (primary) hypertension: Secondary | ICD-10-CM | POA: Diagnosis not present

## 2015-06-09 DIAGNOSIS — Z48815 Encounter for surgical aftercare following surgery on the digestive system: Secondary | ICD-10-CM | POA: Diagnosis not present

## 2015-06-09 DIAGNOSIS — Z8673 Personal history of transient ischemic attack (TIA), and cerebral infarction without residual deficits: Secondary | ICD-10-CM | POA: Diagnosis not present

## 2015-06-09 DIAGNOSIS — I252 Old myocardial infarction: Secondary | ICD-10-CM | POA: Diagnosis not present

## 2015-06-09 DIAGNOSIS — Z720 Tobacco use: Secondary | ICD-10-CM | POA: Diagnosis not present

## 2015-06-10 DIAGNOSIS — Z8673 Personal history of transient ischemic attack (TIA), and cerebral infarction without residual deficits: Secondary | ICD-10-CM | POA: Diagnosis not present

## 2015-06-10 DIAGNOSIS — I10 Essential (primary) hypertension: Secondary | ICD-10-CM | POA: Diagnosis not present

## 2015-06-10 DIAGNOSIS — Z48815 Encounter for surgical aftercare following surgery on the digestive system: Secondary | ICD-10-CM | POA: Diagnosis not present

## 2015-06-10 DIAGNOSIS — I252 Old myocardial infarction: Secondary | ICD-10-CM | POA: Diagnosis not present

## 2015-06-10 DIAGNOSIS — Z720 Tobacco use: Secondary | ICD-10-CM | POA: Diagnosis not present

## 2015-06-11 DIAGNOSIS — R269 Unspecified abnormalities of gait and mobility: Secondary | ICD-10-CM | POA: Diagnosis not present

## 2015-06-11 DIAGNOSIS — Z48815 Encounter for surgical aftercare following surgery on the digestive system: Secondary | ICD-10-CM | POA: Diagnosis not present

## 2015-06-11 DIAGNOSIS — I252 Old myocardial infarction: Secondary | ICD-10-CM | POA: Diagnosis not present

## 2015-06-11 DIAGNOSIS — I10 Essential (primary) hypertension: Secondary | ICD-10-CM | POA: Diagnosis not present

## 2015-06-11 DIAGNOSIS — R0600 Dyspnea, unspecified: Secondary | ICD-10-CM | POA: Diagnosis not present

## 2015-06-11 DIAGNOSIS — R531 Weakness: Secondary | ICD-10-CM | POA: Diagnosis not present

## 2015-06-11 DIAGNOSIS — Z720 Tobacco use: Secondary | ICD-10-CM | POA: Diagnosis not present

## 2015-06-11 DIAGNOSIS — Z8673 Personal history of transient ischemic attack (TIA), and cerebral infarction without residual deficits: Secondary | ICD-10-CM | POA: Diagnosis not present

## 2015-06-11 DIAGNOSIS — K265 Chronic or unspecified duodenal ulcer with perforation: Secondary | ICD-10-CM | POA: Diagnosis not present

## 2015-06-11 DIAGNOSIS — T8189XA Other complications of procedures, not elsewhere classified, initial encounter: Secondary | ICD-10-CM | POA: Diagnosis not present

## 2015-06-11 DIAGNOSIS — J449 Chronic obstructive pulmonary disease, unspecified: Secondary | ICD-10-CM | POA: Diagnosis not present

## 2015-06-13 DIAGNOSIS — J449 Chronic obstructive pulmonary disease, unspecified: Secondary | ICD-10-CM | POA: Diagnosis not present

## 2015-06-13 DIAGNOSIS — H269 Unspecified cataract: Secondary | ICD-10-CM | POA: Diagnosis not present

## 2015-06-13 DIAGNOSIS — I252 Old myocardial infarction: Secondary | ICD-10-CM | POA: Diagnosis not present

## 2015-06-13 DIAGNOSIS — I209 Angina pectoris, unspecified: Secondary | ICD-10-CM | POA: Diagnosis not present

## 2015-06-13 DIAGNOSIS — M545 Low back pain: Secondary | ICD-10-CM | POA: Diagnosis not present

## 2015-06-13 DIAGNOSIS — I509 Heart failure, unspecified: Secondary | ICD-10-CM | POA: Diagnosis not present

## 2015-06-13 DIAGNOSIS — M1990 Unspecified osteoarthritis, unspecified site: Secondary | ICD-10-CM | POA: Diagnosis not present

## 2015-06-13 DIAGNOSIS — I110 Hypertensive heart disease with heart failure: Secondary | ICD-10-CM | POA: Diagnosis not present

## 2015-06-13 DIAGNOSIS — K219 Gastro-esophageal reflux disease without esophagitis: Secondary | ICD-10-CM | POA: Diagnosis not present

## 2015-06-14 DIAGNOSIS — Z720 Tobacco use: Secondary | ICD-10-CM | POA: Diagnosis not present

## 2015-06-14 DIAGNOSIS — Z48815 Encounter for surgical aftercare following surgery on the digestive system: Secondary | ICD-10-CM | POA: Diagnosis not present

## 2015-06-14 DIAGNOSIS — I10 Essential (primary) hypertension: Secondary | ICD-10-CM | POA: Diagnosis not present

## 2015-06-14 DIAGNOSIS — I252 Old myocardial infarction: Secondary | ICD-10-CM | POA: Diagnosis not present

## 2015-06-14 DIAGNOSIS — Z8673 Personal history of transient ischemic attack (TIA), and cerebral infarction without residual deficits: Secondary | ICD-10-CM | POA: Diagnosis not present

## 2015-06-15 DIAGNOSIS — Z8673 Personal history of transient ischemic attack (TIA), and cerebral infarction without residual deficits: Secondary | ICD-10-CM | POA: Diagnosis not present

## 2015-06-15 DIAGNOSIS — I10 Essential (primary) hypertension: Secondary | ICD-10-CM | POA: Diagnosis not present

## 2015-06-15 DIAGNOSIS — I252 Old myocardial infarction: Secondary | ICD-10-CM | POA: Diagnosis not present

## 2015-06-15 DIAGNOSIS — Z48815 Encounter for surgical aftercare following surgery on the digestive system: Secondary | ICD-10-CM | POA: Diagnosis not present

## 2015-06-15 DIAGNOSIS — Z720 Tobacco use: Secondary | ICD-10-CM | POA: Diagnosis not present

## 2015-06-16 DIAGNOSIS — I10 Essential (primary) hypertension: Secondary | ICD-10-CM | POA: Diagnosis not present

## 2015-06-16 DIAGNOSIS — I252 Old myocardial infarction: Secondary | ICD-10-CM | POA: Diagnosis not present

## 2015-06-16 DIAGNOSIS — Z720 Tobacco use: Secondary | ICD-10-CM | POA: Diagnosis not present

## 2015-06-16 DIAGNOSIS — Z48815 Encounter for surgical aftercare following surgery on the digestive system: Secondary | ICD-10-CM | POA: Diagnosis not present

## 2015-06-16 DIAGNOSIS — Z8673 Personal history of transient ischemic attack (TIA), and cerebral infarction without residual deficits: Secondary | ICD-10-CM | POA: Diagnosis not present

## 2015-06-18 DIAGNOSIS — Z48815 Encounter for surgical aftercare following surgery on the digestive system: Secondary | ICD-10-CM | POA: Diagnosis not present

## 2015-06-18 DIAGNOSIS — Z8673 Personal history of transient ischemic attack (TIA), and cerebral infarction without residual deficits: Secondary | ICD-10-CM | POA: Diagnosis not present

## 2015-06-18 DIAGNOSIS — Z720 Tobacco use: Secondary | ICD-10-CM | POA: Diagnosis not present

## 2015-06-18 DIAGNOSIS — I252 Old myocardial infarction: Secondary | ICD-10-CM | POA: Diagnosis not present

## 2015-06-18 DIAGNOSIS — I10 Essential (primary) hypertension: Secondary | ICD-10-CM | POA: Diagnosis not present

## 2015-06-23 DIAGNOSIS — I252 Old myocardial infarction: Secondary | ICD-10-CM | POA: Diagnosis not present

## 2015-06-23 DIAGNOSIS — I10 Essential (primary) hypertension: Secondary | ICD-10-CM | POA: Diagnosis not present

## 2015-06-23 DIAGNOSIS — Z48815 Encounter for surgical aftercare following surgery on the digestive system: Secondary | ICD-10-CM | POA: Diagnosis not present

## 2015-06-23 DIAGNOSIS — Z8673 Personal history of transient ischemic attack (TIA), and cerebral infarction without residual deficits: Secondary | ICD-10-CM | POA: Diagnosis not present

## 2015-06-23 DIAGNOSIS — Z720 Tobacco use: Secondary | ICD-10-CM | POA: Diagnosis not present

## 2015-07-02 ENCOUNTER — Other Ambulatory Visit: Payer: Self-pay | Admitting: General Surgery

## 2015-07-02 DIAGNOSIS — K279 Peptic ulcer, site unspecified, unspecified as acute or chronic, without hemorrhage or perforation: Secondary | ICD-10-CM | POA: Diagnosis not present

## 2015-07-02 DIAGNOSIS — R0600 Dyspnea, unspecified: Secondary | ICD-10-CM

## 2015-07-02 DIAGNOSIS — Z720 Tobacco use: Secondary | ICD-10-CM | POA: Diagnosis not present

## 2015-07-02 DIAGNOSIS — Z8673 Personal history of transient ischemic attack (TIA), and cerebral infarction without residual deficits: Secondary | ICD-10-CM | POA: Diagnosis not present

## 2015-07-02 DIAGNOSIS — I10 Essential (primary) hypertension: Secondary | ICD-10-CM | POA: Diagnosis not present

## 2015-07-02 DIAGNOSIS — E559 Vitamin D deficiency, unspecified: Secondary | ICD-10-CM | POA: Diagnosis not present

## 2015-07-02 DIAGNOSIS — I2510 Atherosclerotic heart disease of native coronary artery without angina pectoris: Secondary | ICD-10-CM | POA: Diagnosis not present

## 2015-07-02 DIAGNOSIS — R5381 Other malaise: Secondary | ICD-10-CM | POA: Diagnosis not present

## 2015-07-02 DIAGNOSIS — E785 Hyperlipidemia, unspecified: Secondary | ICD-10-CM | POA: Diagnosis not present

## 2015-07-02 DIAGNOSIS — R945 Abnormal results of liver function studies: Secondary | ICD-10-CM | POA: Diagnosis not present

## 2015-10-05 DIAGNOSIS — Z0000 Encounter for general adult medical examination without abnormal findings: Secondary | ICD-10-CM | POA: Diagnosis not present

## 2015-10-05 DIAGNOSIS — Z720 Tobacco use: Secondary | ICD-10-CM | POA: Diagnosis not present

## 2015-10-05 DIAGNOSIS — I10 Essential (primary) hypertension: Secondary | ICD-10-CM | POA: Diagnosis not present

## 2015-10-05 DIAGNOSIS — E559 Vitamin D deficiency, unspecified: Secondary | ICD-10-CM | POA: Diagnosis not present

## 2015-10-05 DIAGNOSIS — K279 Peptic ulcer, site unspecified, unspecified as acute or chronic, without hemorrhage or perforation: Secondary | ICD-10-CM | POA: Diagnosis not present

## 2015-10-05 DIAGNOSIS — J449 Chronic obstructive pulmonary disease, unspecified: Secondary | ICD-10-CM | POA: Diagnosis not present

## 2015-10-05 DIAGNOSIS — Z8673 Personal history of transient ischemic attack (TIA), and cerebral infarction without residual deficits: Secondary | ICD-10-CM | POA: Diagnosis not present

## 2015-10-05 DIAGNOSIS — I2510 Atherosclerotic heart disease of native coronary artery without angina pectoris: Secondary | ICD-10-CM | POA: Diagnosis not present

## 2015-10-05 DIAGNOSIS — E785 Hyperlipidemia, unspecified: Secondary | ICD-10-CM | POA: Diagnosis not present

## 2015-11-02 DIAGNOSIS — R945 Abnormal results of liver function studies: Secondary | ICD-10-CM | POA: Diagnosis not present

## 2015-11-02 DIAGNOSIS — E785 Hyperlipidemia, unspecified: Secondary | ICD-10-CM | POA: Diagnosis not present

## 2015-11-02 DIAGNOSIS — Z720 Tobacco use: Secondary | ICD-10-CM | POA: Diagnosis not present

## 2015-11-02 DIAGNOSIS — E559 Vitamin D deficiency, unspecified: Secondary | ICD-10-CM | POA: Diagnosis not present

## 2015-11-02 DIAGNOSIS — I10 Essential (primary) hypertension: Secondary | ICD-10-CM | POA: Diagnosis not present

## 2015-11-02 DIAGNOSIS — J449 Chronic obstructive pulmonary disease, unspecified: Secondary | ICD-10-CM | POA: Diagnosis not present

## 2015-11-02 DIAGNOSIS — Z8673 Personal history of transient ischemic attack (TIA), and cerebral infarction without residual deficits: Secondary | ICD-10-CM | POA: Diagnosis not present

## 2015-11-02 DIAGNOSIS — K279 Peptic ulcer, site unspecified, unspecified as acute or chronic, without hemorrhage or perforation: Secondary | ICD-10-CM | POA: Diagnosis not present

## 2015-11-02 DIAGNOSIS — I2510 Atherosclerotic heart disease of native coronary artery without angina pectoris: Secondary | ICD-10-CM | POA: Diagnosis not present

## 2015-12-09 DIAGNOSIS — E785 Hyperlipidemia, unspecified: Secondary | ICD-10-CM | POA: Diagnosis not present

## 2015-12-09 DIAGNOSIS — J449 Chronic obstructive pulmonary disease, unspecified: Secondary | ICD-10-CM | POA: Diagnosis not present

## 2015-12-09 DIAGNOSIS — I2510 Atherosclerotic heart disease of native coronary artery without angina pectoris: Secondary | ICD-10-CM | POA: Diagnosis not present

## 2015-12-09 DIAGNOSIS — Z8673 Personal history of transient ischemic attack (TIA), and cerebral infarction without residual deficits: Secondary | ICD-10-CM | POA: Diagnosis not present

## 2015-12-09 DIAGNOSIS — K279 Peptic ulcer, site unspecified, unspecified as acute or chronic, without hemorrhage or perforation: Secondary | ICD-10-CM | POA: Diagnosis not present

## 2015-12-09 DIAGNOSIS — R609 Edema, unspecified: Secondary | ICD-10-CM | POA: Diagnosis not present

## 2015-12-09 DIAGNOSIS — I10 Essential (primary) hypertension: Secondary | ICD-10-CM | POA: Diagnosis not present

## 2015-12-09 DIAGNOSIS — E559 Vitamin D deficiency, unspecified: Secondary | ICD-10-CM | POA: Diagnosis not present

## 2015-12-09 DIAGNOSIS — Z720 Tobacco use: Secondary | ICD-10-CM | POA: Diagnosis not present

## 2016-03-16 DIAGNOSIS — E559 Vitamin D deficiency, unspecified: Secondary | ICD-10-CM | POA: Diagnosis not present

## 2016-03-16 DIAGNOSIS — E785 Hyperlipidemia, unspecified: Secondary | ICD-10-CM | POA: Diagnosis not present

## 2016-03-16 DIAGNOSIS — Z125 Encounter for screening for malignant neoplasm of prostate: Secondary | ICD-10-CM | POA: Diagnosis not present

## 2016-03-16 DIAGNOSIS — Z0000 Encounter for general adult medical examination without abnormal findings: Secondary | ICD-10-CM | POA: Diagnosis not present

## 2016-03-16 DIAGNOSIS — I10 Essential (primary) hypertension: Secondary | ICD-10-CM | POA: Diagnosis not present

## 2016-03-16 DIAGNOSIS — Z131 Encounter for screening for diabetes mellitus: Secondary | ICD-10-CM | POA: Diagnosis not present

## 2016-04-07 DIAGNOSIS — R0609 Other forms of dyspnea: Secondary | ICD-10-CM | POA: Diagnosis not present

## 2016-04-07 DIAGNOSIS — R0789 Other chest pain: Secondary | ICD-10-CM | POA: Diagnosis not present

## 2016-04-07 DIAGNOSIS — J449 Chronic obstructive pulmonary disease, unspecified: Secondary | ICD-10-CM | POA: Diagnosis not present

## 2016-04-07 DIAGNOSIS — I10 Essential (primary) hypertension: Secondary | ICD-10-CM | POA: Diagnosis not present

## 2016-04-11 ENCOUNTER — Institutional Professional Consult (permissible substitution): Payer: Medicare HMO | Admitting: Internal Medicine

## 2016-04-17 DIAGNOSIS — I2510 Atherosclerotic heart disease of native coronary artery without angina pectoris: Secondary | ICD-10-CM | POA: Diagnosis not present

## 2016-04-17 DIAGNOSIS — R0602 Shortness of breath: Secondary | ICD-10-CM | POA: Diagnosis not present

## 2016-04-21 DIAGNOSIS — R0609 Other forms of dyspnea: Secondary | ICD-10-CM | POA: Diagnosis not present

## 2016-04-21 DIAGNOSIS — I10 Essential (primary) hypertension: Secondary | ICD-10-CM | POA: Diagnosis not present

## 2016-04-21 DIAGNOSIS — E7800 Pure hypercholesterolemia, unspecified: Secondary | ICD-10-CM | POA: Diagnosis not present

## 2016-04-21 DIAGNOSIS — I209 Angina pectoris, unspecified: Secondary | ICD-10-CM | POA: Diagnosis not present

## 2016-04-28 DIAGNOSIS — I209 Angina pectoris, unspecified: Secondary | ICD-10-CM | POA: Diagnosis present

## 2016-04-28 NOTE — H&P (Signed)
OFFICE VISIT NOTES COPIED TO EPIC FOR DOCUMENTATION  . History of Present Illness Laverda Page MD; 04/21/2016 5:45 PM) Patient words: Last OV 04/07/2016; FU stress test.  The patient is a 66 year old male who presents for a follow-up for Shortness of breath. Patient with history of prior CVA and coronary artery disease, admitted to [facility] on 05/09/2015 with perforated duodenal ulcer. He was readmitted on 05/24/2015 with COPD exacerbation and hypoxemia.  Past medical history includes a CVA 10 or 12 years ago. MI was in 2008. He underwent stress testing in 2016 in [place] that was abnormal due to tachycardia and near syncope. He was previously drinking 2-3 cases of beer a week. He reports alcohol cessation in March of 2017 after perforated duodenal ulcer. He does smoke daily.  I had seen the patient for weeks ago for recent worsening dyspnea, recurrent episodes of exertional chest pain when he walks the dog. I'd set him up for a stress test and he presents here for follow-up.  His last office visit, he has reduced his smoking significantly, now he is on the verge of waiting. He is tolerating Wellbutrin well. He is accompanied a sister at the bedside. Continues to complain of chest discomfort with exertion activity while he walks the dog and worsening dyspnea.   Problem List/Past Medical Anderson Malta Sergeant; 04/21/2016 10:55 AM) DOE (dyspnea on exertion) (R06.09)  Benign essential hypertension (I10)  Chronic airway obstruction, mixed type (J44.9)  Tobacco use disorder, severe, dependence (F17.200)  Hyperlipidemia, group A (E78.00)  Atypical chest pain (R07.89)  Lexiscan myoview stress test 04/14/2016: 1. The resting electrocardiogram demonstrated normal sinus rhythm, normal resting conduction, no resting arrhythmias and normal rest repolarization. Stress EKG is non-diagnostic for ischemia as it a pharmacologic stress using Lexiscan. Stress symptoms included dyspnea. 2. Left  ventricular cavity size is dilated at 186m stress images and measured 165 mL consistent with ischemic dilatation. SPECT images demonstrate Medium perfusion abnormality of mild intensity in the basal anterior and mid anterior myocardial wall(s) on the stress images. The defect is not present on the resting images consistent with ischemia. Gated SPECT images reveal normal myocardial thickening and wall motion. The left ventricular ejection fraction was calculated or visually estimated to be 65%. This is an intermediate risk study, clinical correlation recommended. H/O myocardial infarction, greater than 8 weeks (I25.2)  2004 Aneurysm of heart (I25.3)  Aneurysm, cerebral (I67.1)  Anxiety and depression (F41.8)  H/O: stroke (Z86.73) 2004 No residual defects  Allergies (Anderson MaltaSergeant; 04/21/2016 10:55 AM) No Known Drug Allergies 04/07/2016  Family History (Anderson MaltaSergeant; 04/21/2016 10:55 AM) Mother  living: ulcer: no known cardiocascular conditions:HTN Father  unknown age : cancer:no known cardiovascular conditions Sister 2  1 older 1 younger: younger had a MI at age 589 CVA Brother 2  1 deceased from cancer:1 living; no known cardiovascular conditions  Social History (Anderson MaltaSergeant; 04/21/2016 10:55 AM) Current tobacco use  Heavy tobacco smoker. Non Drinker/No Alcohol Use  Marital status  Single. Number of Children  0. Living Situation  Lives with roommates.  Past Surgical History (Anderson MaltaSergeant; 04/21/2016 10:55 AM) Ulcer Abdominal Surgery 04/2015  Medication History (Anderson MaltaSergeant; 04/21/2016 11:07 AM) Wellbutrin XL ('150MG'$  Tablet ER 24HR, 1 (one) Tablet Oral every morning, Taken starting 04/07/2016) Active. Lipitor ('20MG'$  Tablet, 1 (one) Tablet Oral daily, Taken starting 04/07/2016) Active. Pantoprazole Sodium ('40MG'$  Tablet DR, 1 Oral two times daily) Active. Furosemide ('20MG'$  Tablet, w Oral daily) Active. Metoprolol Tartrate ('25MG'$  Tablet, 1 Oral two times  daily) Active. Potassium Chloride Crys ER (20MEQ Tablet ER, 1 Oral two times daily) Active. AmLODIPine Besylate ('10MG'$  Tablet, 1 Oral daily) Active. Lisinopril ('20MG'$  Tablet, 1 Oral daily) Active. Medications Reconciled (verbally)  Diagnostic Studies History Anderson Malta Sergeant; 04/21/2016 10:57 AM) Echocardiogram  06/11/2015: Mild concentric LV remodeling with normal LV internal dimension and wall motion. LVEF equal to 58%. Normal RV size and contractility. No evidence of left ventricular diastolic dysfunction. No atrium dilation. Aortic valve is mildly to moderately calcified. Trace aortic, pulmonic, mitral cuspid regurgitation. Intra-atrial and intraventricular septa are intact. IVC is normal with normal respiratory variation. Labwork  03/16/2016: TSH 1.12. Hemoglobin 16.1, hematocrit 46.5, MCV 98, MCH 33.8, CBC otherwise normal. Hemoglobin A1c 5.5. Vitamin D 12. Cholesterol 197, triglycerides 108, HDL 34, LDL 141. Creatinine 0.73, potassium 4.6, CMP normal. Endoscopy 2012 Sleep Study 2013 Normal. Treadmill stress test 2014 Colonoscopy 2015 Nuclear stress test 04/14/2016 1. The resting electrocardiogram demonstrated normal sinus rhythm, normal resting conduction, no resting arrhythmias and normal rest repolarization. Stress EKG is non-diagnostic for ischemia as it a pharmacologic stress using Lexiscan. Stress symptoms included dyspnea. 2. Left ventricular cavity size is dilated at 172m stress images and measured 165 mL consistent with ischemic dilatation. SPECT images demonstrate Medium perfusion abnormality of mild intensity in the basal anterior and mid anterior myocardial wall(s) on the stress images. The defect is not present on the resting images consistent with ischemia. Gated SPECT images reveal normal myocardial thickening and wall motion. The left ventricular ejection fraction was calculated or visually estimated to be 65%. This is an intermediate risk study, clinical correlation  recommended.    Review of Systems (Laverda Page MD; 04/21/2016 12:0 PM) General Not Present- Appetite Loss, Fatigue and Weight Gain. Respiratory Present- Chronic Cough and Difficulty Breathing on Exertion. Not Present- Wakes up from Sleep Wheezing or Short of Breath. Cardiovascular Present- Chest Pain (occasional sharp pain) and Shortness of Breath. Not Present- Edema, Leg Pain and/or Swelling and Palpitations. Gastrointestinal Not Present- Black, Tarry Stool and Difficulty Swallowing. Musculoskeletal Not Present- Decreased Range of Motion and Muscle Atrophy. Neurological Not Present- Attention Deficit. Psychiatric Not Present- Personality Changes and Suicidal Ideation. Endocrine Not Present- Cold Intolerance and Heat Intolerance. Hematology Not Present- Abnormal Bleeding. All other systems negative  Vitals (Anderson MaltaSergeant; 04/21/2016 11:09 AM) 04/21/2016 11:04 AM Weight: 259 lb Height: 73in Body Surface Area: 2.4 m Body Mass Index: 34.17 kg/m  Pulse: 54 (Regular)  P.OX: 95% (Room air) BP: 116/57 (Sitting, Left Arm, Standard)       Physical Exam (Laverda PageMD; 04/21/2016 5:45 PM) General Mental Status-Alert. General Appearance-Cooperative, Appears older than stated age, Not in acute distress. Build & Nutrition-Well built and Morbidly obese.  Head and Neck Neck -Note: Short neck and difficult to evaluate JVD.  Thyroid Gland Characteristics - no palpable nodules, no palpable enlargement.  Chest and Lung Exam Inspection Shape - Barrel-shaped. Auscultation Breath sounds - Decreased - Both Lung Fields. Adventitious sounds - Rales - Both Lung Fields.  Cardiovascular Cardiovascular examination reveals -normal heart sounds, regular rate and rhythm with no murmurs. Inspection Jugular vein - Right - No Distention.  Abdomen Inspection Contour - Obese and Pannus present. Palpation/Percussion Normal exam - Non Tender and No  hepatosplenomegaly. Auscultation Normal exam - Bowel sounds normal.  Peripheral Vascular Lower Extremity Inspection - Bilateral - Inspection Normal. Palpation - Edema - Bilateral - No edema. Femoral pulse - Bilateral - Feeble(Pulsus difficult to feel due to patient's bodily habitus.), No Bruits. Popliteal pulse - Bilateral -  Feeble(Pulsus difficult to feel due to patient's bodily habitus.). Dorsalis pedis pulse - Bilateral - Normal. Posterior tibial pulse - Bilateral - Normal. Carotid arteries - Bilateral-No Carotid bruit.  Neurologic Neurologic evaluation reveals -alert and oriented x 3 with no impairment of recent or remote memory. Motor-Grossly intact without any focal deficits.  Musculoskeletal Global Assessment Left Lower Extremity - normal range of motion without pain. Right Lower Extremity - normal range of motion without pain.    Assessment & Plan Laverda Page MD; 04/21/2016 5:56 PM) Angina pectoris (I20.9) Story: Lexiscan myoview stress test 04/14/2016: 1. The resting electrocardiogram demonstrated normal sinus rhythm, normal resting conduction, no resting arrhythmias and normal rest repolarization. Stress EKG is non-diagnostic for ischemia as it a pharmacologic stress using Lexiscan. Stress symptoms included dyspnea. 2. Left ventricular cavity size is dilated at 13m stress images and measured 165 mL consistent with ischemic dilatation. SPECT images demonstrate Medium perfusion abnormality of mild intensity in the basal anterior and mid anterior myocardial wall(s) on the stress images. The defect is not present on the resting images consistent with ischemia. Gated SPECT images reveal normal myocardial thickening and wall motion. The left ventricular ejection fraction was calculated or visually estimated to be 65%. This is an intermediate risk study, clinical correlation recommended. Current Plans Started Isosorbide Mononitrate ER '60MG'$ , 1 (one) Tablet every morning,  #30, 04/21/2016, Ref. x2. Started Aspirin '81MG'$ , 1 (one) Tablet daily, #180, 30 days starting 04/21/2016, Ref. x2, Mail Order #90, 90 days, Ref. x3. Started Nitrostat 0.'4MG'$ , 1 (one) Tablet every minutes as needed for chest pain, #25, 04/21/2016, Ref. x1. DOE (dyspnea on exertion) (R06.09) Story: Echocardiogram 06/11/2015: Mild concentric LV remodeling with normal LV internal dimension and wall motion. LVEF equal to 58%. Normal RV size and contractility. No evidence of left ventricular diastolic dysfunction. No atrium dilation. Aortic valve is mildly to moderately calcified. Trace aortic, pulmonic, mitral cuspid regurgitation. Intra-atrial and intraventricular septa are intact. IVC is normal with normal respiratory variation. Current Plans CBC & PLATELETS (AUTO) ([id number] METABOLIC PANEL, BASIC ([id number] PT (PROTHROMBIN TIME) ([id number] Benign essential hypertension (I10) Impression: EKG 04/07/2016: Marked sinus bradycardia at the rate of 49 bpm, normal axis. No evidence of ischemia, otherwise normal EKG. Chronic airway obstruction, mixed type (J44.9) Tobacco use disorder, severe, dependence (F17.200) H/O myocardial infarction, greater than 8 weeks (I25.2) Story: 2004, no details available H/O: stroke (Z86.73) Story: No residual defects Labwork Story: Labs 04/21/2016: HB 16.0/HCT 45.3, platelets 235, normal indicis. Serum glucose 105 mg, nonfasting. BUN 12, creatinine 1.0, potassium 5.0. Pro time 9.9 seconds.  03/16/2016: TSH 1.12. Hemoglobin 16.1, hematocrit 46.5, MCV 98, MCH 33.8, CBC otherwise normal. Hemoglobin A1c 5.5. Vitamin D 12. Cholesterol 197, triglycerides 108, HDL 34, LDL 141. Creatinine 0.73, potassium 4.6, CMP normal. Hyperlipidemia, group A (E78.00)  Current Plans Continued Lipitor '20MG'$ , 1 (one) Tablet daily, #30, 04/21/2016, Ref. x3. Plan:  Mechanism of underlying disease process and action of medications discussed with the patient. I also discussed primary/secondary prevention and  dietary counseling was done. Patient is here on his 4 week follow-up visit, I'm very pleasantly surprised that he has made significant lifestyle changes, he addressed Appropriately, has improved his general hygiene, reduced his smoking and is on the verge of quitting.  He has class III angina pectoris, even doing minimal activities of walking around with his dog slowly he has been having exertional chest discomfort that is relieved with nitroglycerin. Due to intermediate risk study and also to improve his exercise tolerance, also patient having made  lifestyle changes, I have discussed with him regarding proceeding with coronary angiography. He does not want CABG but is understandable if he needs it on emergent basis due to complications.  Continue Lipitor for now, added Imdur and also given a prescription for subdermal nitroglycerin, he had used from previous. Schedule for cardiac catheterization, and possible angioplasty. We discussed regarding risks, benefits, alternatives to this including stress testing, CTA and continued medical therapy. Patient wants to proceed. Understands <1-2% risk of death, stroke, MI, urgent CABG, bleeding, infection, renal failure but not limited to these.  CC: Dr. Iona Beard Osei-Bonsu    Signed by Laverda Page, MD (04/21/2016 5:57 PM)

## 2016-05-02 ENCOUNTER — Ambulatory Visit (HOSPITAL_COMMUNITY)
Admission: RE | Admit: 2016-05-02 | Discharge: 2016-05-02 | Disposition: A | Discharge status: Home / Self Care | Payer: Medicare Other | Source: Ambulatory Visit | Attending: Cardiology | Admitting: Cardiology

## 2016-05-02 ENCOUNTER — Telehealth: Payer: Self-pay | Admitting: Internal Medicine

## 2016-05-02 ENCOUNTER — Encounter (HOSPITAL_COMMUNITY): Payer: Self-pay | Admitting: *Deleted

## 2016-05-02 ENCOUNTER — Ambulatory Visit (HOSPITAL_COMMUNITY)
Admission: RE | Disposition: A | Discharge status: Home / Self Care | Payer: Self-pay | Source: Ambulatory Visit | Attending: Cardiology

## 2016-05-02 DIAGNOSIS — F419 Anxiety disorder, unspecified: Secondary | ICD-10-CM | POA: Diagnosis not present

## 2016-05-02 DIAGNOSIS — I10 Essential (primary) hypertension: Secondary | ICD-10-CM | POA: Diagnosis not present

## 2016-05-02 DIAGNOSIS — Z8673 Personal history of transient ischemic attack (TIA), and cerebral infarction without residual deficits: Secondary | ICD-10-CM | POA: Diagnosis not present

## 2016-05-02 DIAGNOSIS — F329 Major depressive disorder, single episode, unspecified: Secondary | ICD-10-CM | POA: Diagnosis not present

## 2016-05-02 DIAGNOSIS — I209 Angina pectoris, unspecified: Secondary | ICD-10-CM | POA: Diagnosis present

## 2016-05-02 DIAGNOSIS — Z8249 Family history of ischemic heart disease and other diseases of the circulatory system: Secondary | ICD-10-CM | POA: Diagnosis not present

## 2016-05-02 DIAGNOSIS — I252 Old myocardial infarction: Secondary | ICD-10-CM | POA: Diagnosis not present

## 2016-05-02 DIAGNOSIS — J449 Chronic obstructive pulmonary disease, unspecified: Secondary | ICD-10-CM | POA: Diagnosis not present

## 2016-05-02 DIAGNOSIS — E7800 Pure hypercholesterolemia, unspecified: Secondary | ICD-10-CM | POA: Diagnosis not present

## 2016-05-02 DIAGNOSIS — Z6834 Body mass index (BMI) 34.0-34.9, adult: Secondary | ICD-10-CM | POA: Diagnosis not present

## 2016-05-02 DIAGNOSIS — R0602 Shortness of breath: Secondary | ICD-10-CM | POA: Diagnosis not present

## 2016-05-02 DIAGNOSIS — F17210 Nicotine dependence, cigarettes, uncomplicated: Secondary | ICD-10-CM | POA: Insufficient documentation

## 2016-05-02 DIAGNOSIS — R0789 Other chest pain: Secondary | ICD-10-CM | POA: Diagnosis not present

## 2016-05-02 HISTORY — PX: LEFT HEART CATH AND CORONARY ANGIOGRAPHY: CATH118249

## 2016-05-02 LAB — GLUCOSE, CAPILLARY: Glucose-Capillary: 97 mg/dL (ref 65–99)

## 2016-05-02 SURGERY — LEFT HEART CATH AND CORONARY ANGIOGRAPHY
Anesthesia: LOCAL

## 2016-05-02 MED ORDER — SODIUM CHLORIDE 0.9% FLUSH: 3.0000 mL | Freq: Two times a day (BID) | INTRAVENOUS | Status: DC

## 2016-05-02 MED ORDER — VERAPAMIL HCL 2.5 MG/ML IV SOLN
INTRAVENOUS | Status: AC
  Filled 2016-05-02: qty 2

## 2016-05-02 MED ORDER — HEPARIN (PORCINE) IN NACL 2-0.9 UNIT/ML-% IJ SOLN
INTRAMUSCULAR | Status: AC
  Filled 2016-05-02: qty 1000

## 2016-05-02 MED ORDER — SODIUM CHLORIDE 0.9% FLUSH: 3.0000 mL | INTRAVENOUS | Status: DC | PRN

## 2016-05-02 MED ORDER — LIDOCAINE HCL (PF) 1 % IJ SOLN
INTRAMUSCULAR | Status: AC
  Filled 2016-05-02: qty 30

## 2016-05-02 MED ORDER — MIDAZOLAM HCL 2 MG/2ML IJ SOLN
INTRAMUSCULAR | Status: DC | PRN
  Administered 2016-05-02: 1 mg via INTRAVENOUS
  Administered 2016-05-02: 2 mg via INTRAVENOUS

## 2016-05-02 MED ORDER — SODIUM CHLORIDE 0.9 % WEIGHT BASED INFUSION: 1.0000 mL/kg/h | INTRAVENOUS | Status: DC

## 2016-05-02 MED ORDER — SODIUM CHLORIDE 0.9 % IV SOLN
INTRAVENOUS | Status: DC | PRN
  Administered 2016-05-02: 20 mL/h via INTRAVENOUS

## 2016-05-02 MED ORDER — SODIUM CHLORIDE 0.9 % WEIGHT BASED INFUSION
3.0000 mL/kg/h | INTRAVENOUS | Status: DC
  Administered 2016-05-02: 3 mL/kg/h via INTRAVENOUS

## 2016-05-02 MED ORDER — MIDAZOLAM HCL 2 MG/2ML IJ SOLN
INTRAMUSCULAR | Status: AC
  Filled 2016-05-02: qty 2

## 2016-05-02 MED ORDER — HEPARIN SODIUM (PORCINE) 1000 UNIT/ML IJ SOLN
INTRAMUSCULAR | Status: AC
  Filled 2016-05-02: qty 1

## 2016-05-02 MED ORDER — IOPAMIDOL (ISOVUE-370) INJECTION 76%
INTRAVENOUS | Status: DC | PRN
  Administered 2016-05-02: 40 mL via INTRA_ARTERIAL

## 2016-05-02 MED ORDER — LIDOCAINE HCL (PF) 1 % IJ SOLN
INTRAMUSCULAR | Status: DC | PRN
  Administered 2016-05-02: 2 mL

## 2016-05-02 MED ORDER — HYDROMORPHONE HCL 1 MG/ML IJ SOLN
INTRAMUSCULAR | Status: DC | PRN
  Administered 2016-05-02 (×2): 0.5 mg via INTRAVENOUS

## 2016-05-02 MED ORDER — HEPARIN (PORCINE) IN NACL 2-0.9 UNIT/ML-% IJ SOLN
INTRAMUSCULAR | Status: DC | PRN
  Administered 2016-05-02: 1000 mL

## 2016-05-02 MED ORDER — ASPIRIN 81 MG PO CHEW: 81.0000 mg | CHEWABLE_TABLET | ORAL | Status: DC

## 2016-05-02 MED ORDER — SODIUM CHLORIDE 0.9 % IV SOLN: 250.0000 mL | INTRAVENOUS | Status: DC | PRN

## 2016-05-02 MED ORDER — NITROGLYCERIN 1 MG/10 ML FOR IR/CATH LAB
INTRA_ARTERIAL | Status: AC
  Filled 2016-05-02: qty 10

## 2016-05-02 MED ORDER — VERAPAMIL HCL 2.5 MG/ML IV SOLN
INTRA_ARTERIAL | Status: DC | PRN
  Administered 2016-05-02: 8 mL via INTRA_ARTERIAL

## 2016-05-02 MED ORDER — HYDROMORPHONE HCL 1 MG/ML IJ SOLN
INTRAMUSCULAR | Status: AC
  Filled 2016-05-02: qty 1

## 2016-05-02 SURGICAL SUPPLY — 9 items
CATH OPTITORQUE TIG 4.0 5F (CATHETERS) ×2 IMPLANT
DEVICE RAD COMP TR BAND LRG (VASCULAR PRODUCTS) ×2 IMPLANT
GLIDESHEATH SLEND A-KIT 6F 20G (SHEATH) ×2 IMPLANT
GUIDEWIRE INQWIRE 1.5J.035X260 (WIRE) ×1 IMPLANT
INQWIRE 1.5J .035X260CM (WIRE) ×2
KIT HEART LEFT (KITS) ×2 IMPLANT
PACK CARDIAC CATHETERIZATION (CUSTOM PROCEDURE TRAY) ×2 IMPLANT
TRANSDUCER W/STOPCOCK (MISCELLANEOUS) ×2 IMPLANT
TUBING CIL FLEX 10 FLL-RA (TUBING) ×2 IMPLANT

## 2016-05-02 NOTE — Telephone Encounter (Signed)
Spoke with pt's sister. Call should/could have been handled by a scheduler. Pt's consult has been rescheduled for 06/02/16 at 10:15am. Nothing further was needed.

## 2016-05-02 NOTE — Interval H&P Note (Signed)
History and Physical Interval Note:  05/02/2016 1:27 PM  Gregory Lane  has presented today for surgery, with the diagnosis of cp, sob  The various methods of treatment have been discussed with the patient and family. After consideration of risks, benefits and other options for treatment, the patient has consented to  Procedure(s): Left Heart Cath and Coronary Angiography (N/A) and possible PCI  as a surgical intervention .  The patient's history has been reviewed, patient examined, no change in status, stable for surgery.  I have reviewed the patient's chart and labs.  Questions were answered to the patient's satisfaction.    Ischemic Symptoms? CCS III (Marked limitation of ordinary activity) Anti-ischemic Medical Therapy? Maximal Medical Therapy (2 or more classes of medications) Non-invasive Test Results? Intermediate-risk stress test findings: cardiac mortality 1-3%/year Prior CABG? No Previous CABG   Patient Information:   1-2V CAD, no prox LAD  A (8)  Indication: 17; Score: 8   Patient Information:   CTO of 1 vessel, no other CAD  A (7)  Indication: 27; Score: 7   Patient Information:   1V CAD with prox LAD  A (9)  Indication: 33; Score: 9   Patient Information:   2V-CAD with prox LAD  A (9)  Indication: 39; Score: 9   Patient Information:   3V-CAD without LMCA  A (9)  Indication: 45; Score: 9   Patient Information:   3V-CAD without LMCA With Abnormal LV systolic function  A (9)  Indication: 48; Score: 9   Patient Information:   LMCA-CAD  A (9)  Indication: 49; Score: 9   Patient Information:   2V-CAD with prox LAD PCI  A (7)  Indication: 62; Score: 7   Patient Information:   2V-CAD with prox LAD CABG  A (8)  Indication: 62; Score: 8   Patient Information:   3V-CAD without LMCA With Low CAD burden(i.e., 3 focal stenoses, low SYNTAX score) PCI  A (7)  Indication: 63; Score: 7   Patient Information:   3V-CAD without  LMCA With Low CAD burden(i.e., 3 focal stenoses, low SYNTAX score) CABG  A (9)  Indication: 63; Score: 9   Patient Information:   3V-CAD without LMCA E06c - Intermediate-high CAD burden (i.e., multiple diffuse lesions, presence of CTO, or high SYNTAX score) PCI  U (4)  Indication: 64; Score: 4   Patient Information:   3V-CAD without LMCA E06c - Intermediate-high CAD burden (i.e., multiple diffuse lesions, presence of CTO, or high SYNTAX score) CABG  A (9)  Indication: 64; Score: 9   Patient Information:   LMCA-CAD With Isolated LMCA stenosis  PCI  U (6)  Indication: 65; Score: 6   Patient Information:   LMCA-CAD With Isolated LMCA stenosis  CABG  A (9)  Indication: 65; Score: 9   Patient Information:   LMCA-CAD Additional CAD, low CAD burden (i.e., 1- to 2-vessel additional involvement, low SYNTAX score) PCI  U (5)  Indication: 66; Score: 5   Patient Information:   LMCA-CAD Additional CAD, low CAD burden (i.e., 1- to 2-vessel additional involvement, low SYNTAX score) CABG  A (9)  Indication: 66; Score: 9   Patient Information:   LMCA-CAD Additional CAD, intermediate-high CAD burden (i.e., 3-vessel involvement, presence of CTO, or high SYNTAX score) PCI  I (3)  Indication: 67; Score: 3   Patient Information:   LMCA-CAD Additional CAD, intermediate-high CAD burden (i.e., 3-vessel involvement, presence of CTO, or high SYNTAX score) CABG  A (9)  Indication: 67;  Score: 9   Gregory Lane

## 2016-05-02 NOTE — Discharge Instructions (Signed)
Radial Site Care °Refer to this sheet in the next few weeks. These instructions provide you with information about caring for yourself after your procedure. Your health care provider may also give you more specific instructions. Your treatment has been planned according to current medical practices, but problems sometimes occur. Call your health care provider if you have any problems or questions after your procedure. °What can I expect after the procedure? °After your procedure, it is typical to have the following: °· Bruising at the radial site that usually fades within 1-2 weeks. °· Blood collecting in the tissue (hematoma) that may be painful to the touch. It should usually decrease in size and tenderness within 1-2 weeks. °Follow these instructions at home: °· Take medicines only as directed by your health care provider. °· You may shower 24-48 hours after the procedure or as directed by your health care provider. Remove the bandage (dressing) and gently wash the site with plain soap and water. Pat the area dry with a clean towel. Do not rub the site, because this may cause bleeding. °· Do not take baths, swim, or use a hot tub until your health care provider approves. °· Check your insertion site every day for redness, swelling, or drainage. °· Do not apply powder or lotion to the site. °· Do not flex or bend the affected arm for 24 hours or as directed by your health care provider. °· Do not push or pull heavy objects with the affected arm for 24 hours or as directed by your health care provider. °· Do not lift over 10 lb (4.5 kg) for 5 days after your procedure or as directed by your health care provider. °· Ask your health care provider when it is okay to: °¨ Return to work or school. °¨ Resume usual physical activities or sports. °¨ Resume sexual activity. °· Do not drive home if you are discharged the same day as the procedure. Have someone else drive you. °· You may drive 24 hours after the procedure  unless otherwise instructed by your health care provider. °· Do not operate machinery or power tools for 24 hours after the procedure. °· If your procedure was done as an outpatient procedure, which means that you went home the same day as your procedure, a responsible adult should be with you for the first 24 hours after you arrive home. °· Keep all follow-up visits as directed by your health care provider. This is important. °Contact a health care provider if: °· You have a fever. °· You have chills. °· You have increased bleeding from the radial site. Hold pressure on the site. °Get help right away if: °· You have unusual pain at the radial site. °· You have redness, warmth, or swelling at the radial site. °· You have drainage (other than a small amount of blood on the dressing) from the radial site. °· The radial site is bleeding, and the bleeding does not stop after 30 minutes of holding steady pressure on the site. °· Your arm or hand becomes pale, cool, tingly, or numb. °This information is not intended to replace advice given to you by your health care provider. Make sure you discuss any questions you have with your health care provider. °Document Released: 03/11/2010 Document Revised: 07/15/2015 Document Reviewed: 08/25/2013 °Elsevier Interactive Patient Education © 2017 [employer] Inc. ° °

## 2016-05-03 ENCOUNTER — Encounter (HOSPITAL_COMMUNITY): Payer: Self-pay | Admitting: Cardiology

## 2016-05-04 MED FILL — Heparin Sodium (Porcine) Inj 1000 Unit/ML: INTRAMUSCULAR | Qty: 10 | Status: AC

## 2016-05-11 DIAGNOSIS — I209 Angina pectoris, unspecified: Secondary | ICD-10-CM | POA: Diagnosis not present

## 2016-05-11 DIAGNOSIS — R0609 Other forms of dyspnea: Secondary | ICD-10-CM | POA: Diagnosis not present

## 2016-05-11 DIAGNOSIS — J449 Chronic obstructive pulmonary disease, unspecified: Secondary | ICD-10-CM | POA: Diagnosis not present

## 2016-05-11 DIAGNOSIS — I10 Essential (primary) hypertension: Secondary | ICD-10-CM | POA: Diagnosis not present

## 2016-05-24 DIAGNOSIS — Z0000 Encounter for general adult medical examination without abnormal findings: Secondary | ICD-10-CM | POA: Diagnosis not present

## 2016-05-24 DIAGNOSIS — E785 Hyperlipidemia, unspecified: Secondary | ICD-10-CM | POA: Diagnosis not present

## 2016-05-24 DIAGNOSIS — Z8673 Personal history of transient ischemic attack (TIA), and cerebral infarction without residual deficits: Secondary | ICD-10-CM | POA: Diagnosis not present

## 2016-05-24 DIAGNOSIS — H539 Unspecified visual disturbance: Secondary | ICD-10-CM | POA: Diagnosis not present

## 2016-05-24 DIAGNOSIS — I10 Essential (primary) hypertension: Secondary | ICD-10-CM | POA: Diagnosis not present

## 2016-06-02 ENCOUNTER — Institutional Professional Consult (permissible substitution): Payer: Medicare HMO | Admitting: Internal Medicine

## 2016-07-26 DIAGNOSIS — E559 Vitamin D deficiency, unspecified: Secondary | ICD-10-CM | POA: Diagnosis not present

## 2016-07-26 DIAGNOSIS — I10 Essential (primary) hypertension: Secondary | ICD-10-CM | POA: Diagnosis not present

## 2016-07-26 DIAGNOSIS — Z8673 Personal history of transient ischemic attack (TIA), and cerebral infarction without residual deficits: Secondary | ICD-10-CM | POA: Diagnosis not present

## 2016-07-26 DIAGNOSIS — E785 Hyperlipidemia, unspecified: Secondary | ICD-10-CM | POA: Diagnosis not present

## 2016-07-26 DIAGNOSIS — Z720 Tobacco use: Secondary | ICD-10-CM | POA: Diagnosis not present

## 2016-09-06 DIAGNOSIS — I10 Essential (primary) hypertension: Secondary | ICD-10-CM | POA: Diagnosis not present

## 2016-09-06 DIAGNOSIS — J449 Chronic obstructive pulmonary disease, unspecified: Secondary | ICD-10-CM | POA: Diagnosis not present

## 2016-09-06 DIAGNOSIS — Z8673 Personal history of transient ischemic attack (TIA), and cerebral infarction without residual deficits: Secondary | ICD-10-CM | POA: Diagnosis not present

## 2016-09-29 ENCOUNTER — Other Ambulatory Visit: Payer: Self-pay

## 2016-09-29 NOTE — Patient Outreach (Signed)
[facility]) Care Management  09/29/2016  Gregory Lane Gregory Lane, Gregory Lane [id number]   Medication Adherence call to GregoryGregory Lane the reason for this call is because Gregory Lane is showing past due under [place] [employer] care Ins.on his lisinopril Lane mg spoke to Gregory Lane he said he got some samples from the doctor's office and he still has some medication he does need help with a place to stay by the end of the month.    [facility] Management Direct Dial [phone]  Fax [phone] [email or web address]

## 2016-10-09 ENCOUNTER — Other Ambulatory Visit: Payer: Self-pay

## 2016-10-09 NOTE — Patient Outreach (Signed)
[facility]) Care Management  10/09/2016  Gregory Lane 1950/11/24 [id number]  TELEPHONE SCREENING Referral date: 10/03/16 Referral source:  Pharmacy patient engagement referral Referral reason: engagement tool score: 8 Insurance: [employer]  Telephone call to patient regarding pharmacy referral. Unable to reach patient. HIPAA compliant voice message left with call back phone number.   PLAN: RNCM will attempt 2nd telephone outreach to patient within 2 weeks.  [name] RN,BSN,CCM [facility] Telephonic  [phone]

## 2016-10-11 DIAGNOSIS — E785 Hyperlipidemia, unspecified: Secondary | ICD-10-CM | POA: Diagnosis not present

## 2016-10-11 DIAGNOSIS — J449 Chronic obstructive pulmonary disease, unspecified: Secondary | ICD-10-CM | POA: Diagnosis not present

## 2016-10-11 DIAGNOSIS — I10 Essential (primary) hypertension: Secondary | ICD-10-CM | POA: Diagnosis not present

## 2016-10-11 DIAGNOSIS — Z8673 Personal history of transient ischemic attack (TIA), and cerebral infarction without residual deficits: Secondary | ICD-10-CM | POA: Diagnosis not present

## 2016-10-16 ENCOUNTER — Other Ambulatory Visit: Payer: Self-pay

## 2016-10-16 NOTE — Patient Outreach (Signed)
[name]) Care Management  10/16/2016  [name] 17, 1952 [id number]  TELEPHONE SCREENING Referral date: 10/03/16 Referral source:  Pharmacy patient engagement referral Referral reason: engagement tool score: 8 Insurance: [employer] Attempt #2  Second telephone call to patient regarding pharmacy referral. Unable to reach patient. HIPAA compliant voice message left with call back phone number.   PLAN: RNCM will attempt 3rd  telephone outreach to patient within 2 weeks.  [name] RN,BSN,CCM [facility] Telephonic  [phone]

## 2016-10-19 ENCOUNTER — Other Ambulatory Visit: Payer: Self-pay

## 2016-10-19 NOTE — Patient Outreach (Signed)
[facility]) Care Management  10/19/2016  Patterson Hollenbaugh Butzin 11-14-1950 [id number]  TELEPHONE SCREENING Referral date: 10/03/16 Referral source: Pharmacy patient engagement referral Referral reason: engagement tool score: 8 Insurance: [employer] Attempt #3  Third  telephone call to patient regarding pharmacy referral. Unable to reach patient. HIPAA compliant voice message left with call back phone number.   PLAN: RNCM will send patient outreach letter to attempt contact.   [name] RN,BSN,CCM [facility] Telephonic  [phone]

## 2016-11-02 ENCOUNTER — Other Ambulatory Visit: Payer: Self-pay

## 2016-11-02 NOTE — Patient Outreach (Signed)
[facility]) Care Management  11/02/2016  Gregory Lane [birth year] [id number]   CASE CLOSURE / TELEPHONE SCREENING Referral date: 10/03/16 Referral source: Pharmacy patient engagement referral Referral reason: engagement tool score: 8 Insurance: [employer]  No response from patient after 3 telephone attempts and outreach letter.  PLAN: RNCM will refer patient to care management assistant to close due to being unable to reach patient.  RNCM will notify patients primary MD of closure.   [name] RN,BSN,CCM [facility] Telephonic  [phone]

## 2016-12-23 IMAGING — CR DG CHEST 2V
2 series · 2 of 2 positions shown · non-contrast
Comparison: 05/17/2015.

CLINICAL DATA: Shortness of breath and cough.

EXAM:
CHEST  2 VIEW

[chest pa]
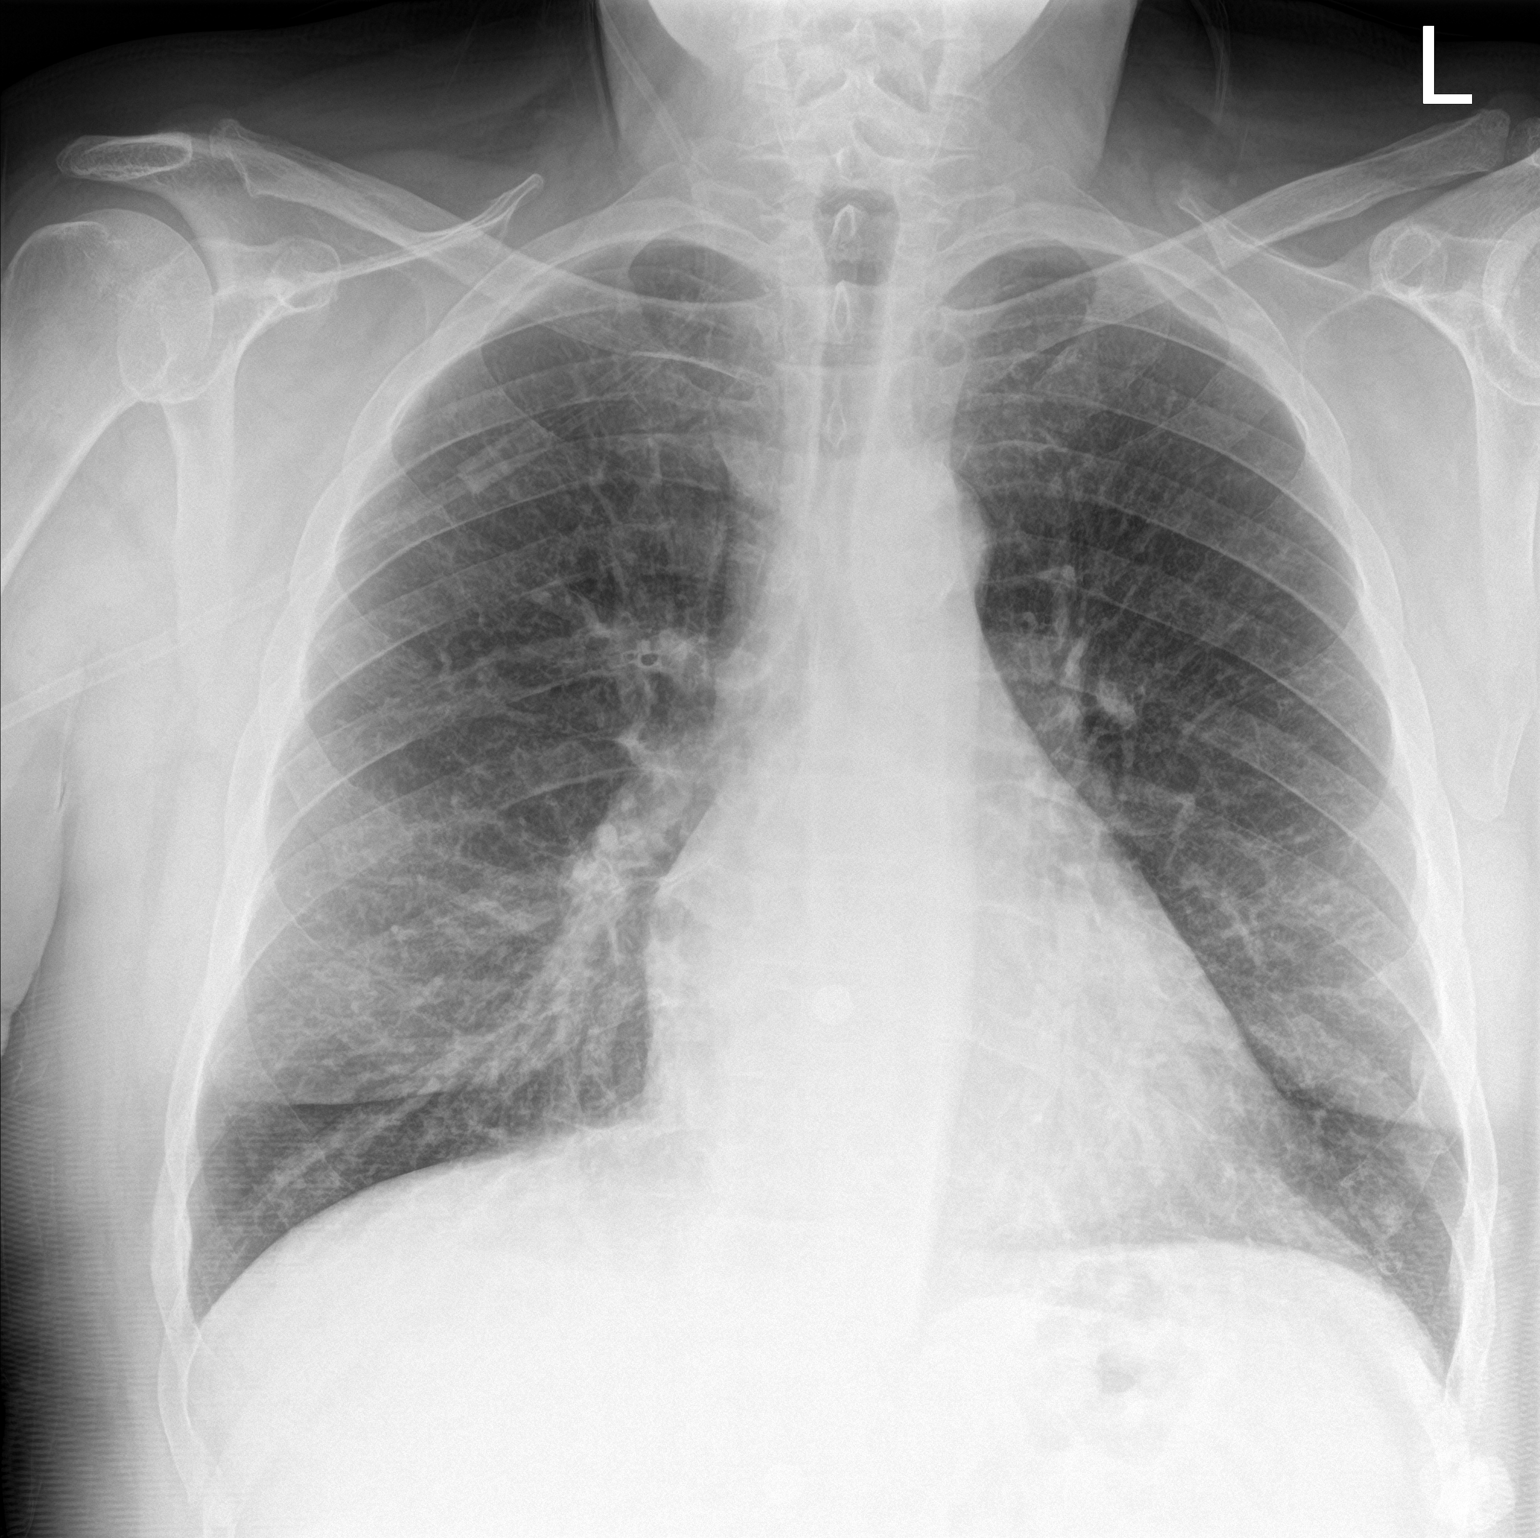

[chest lat]
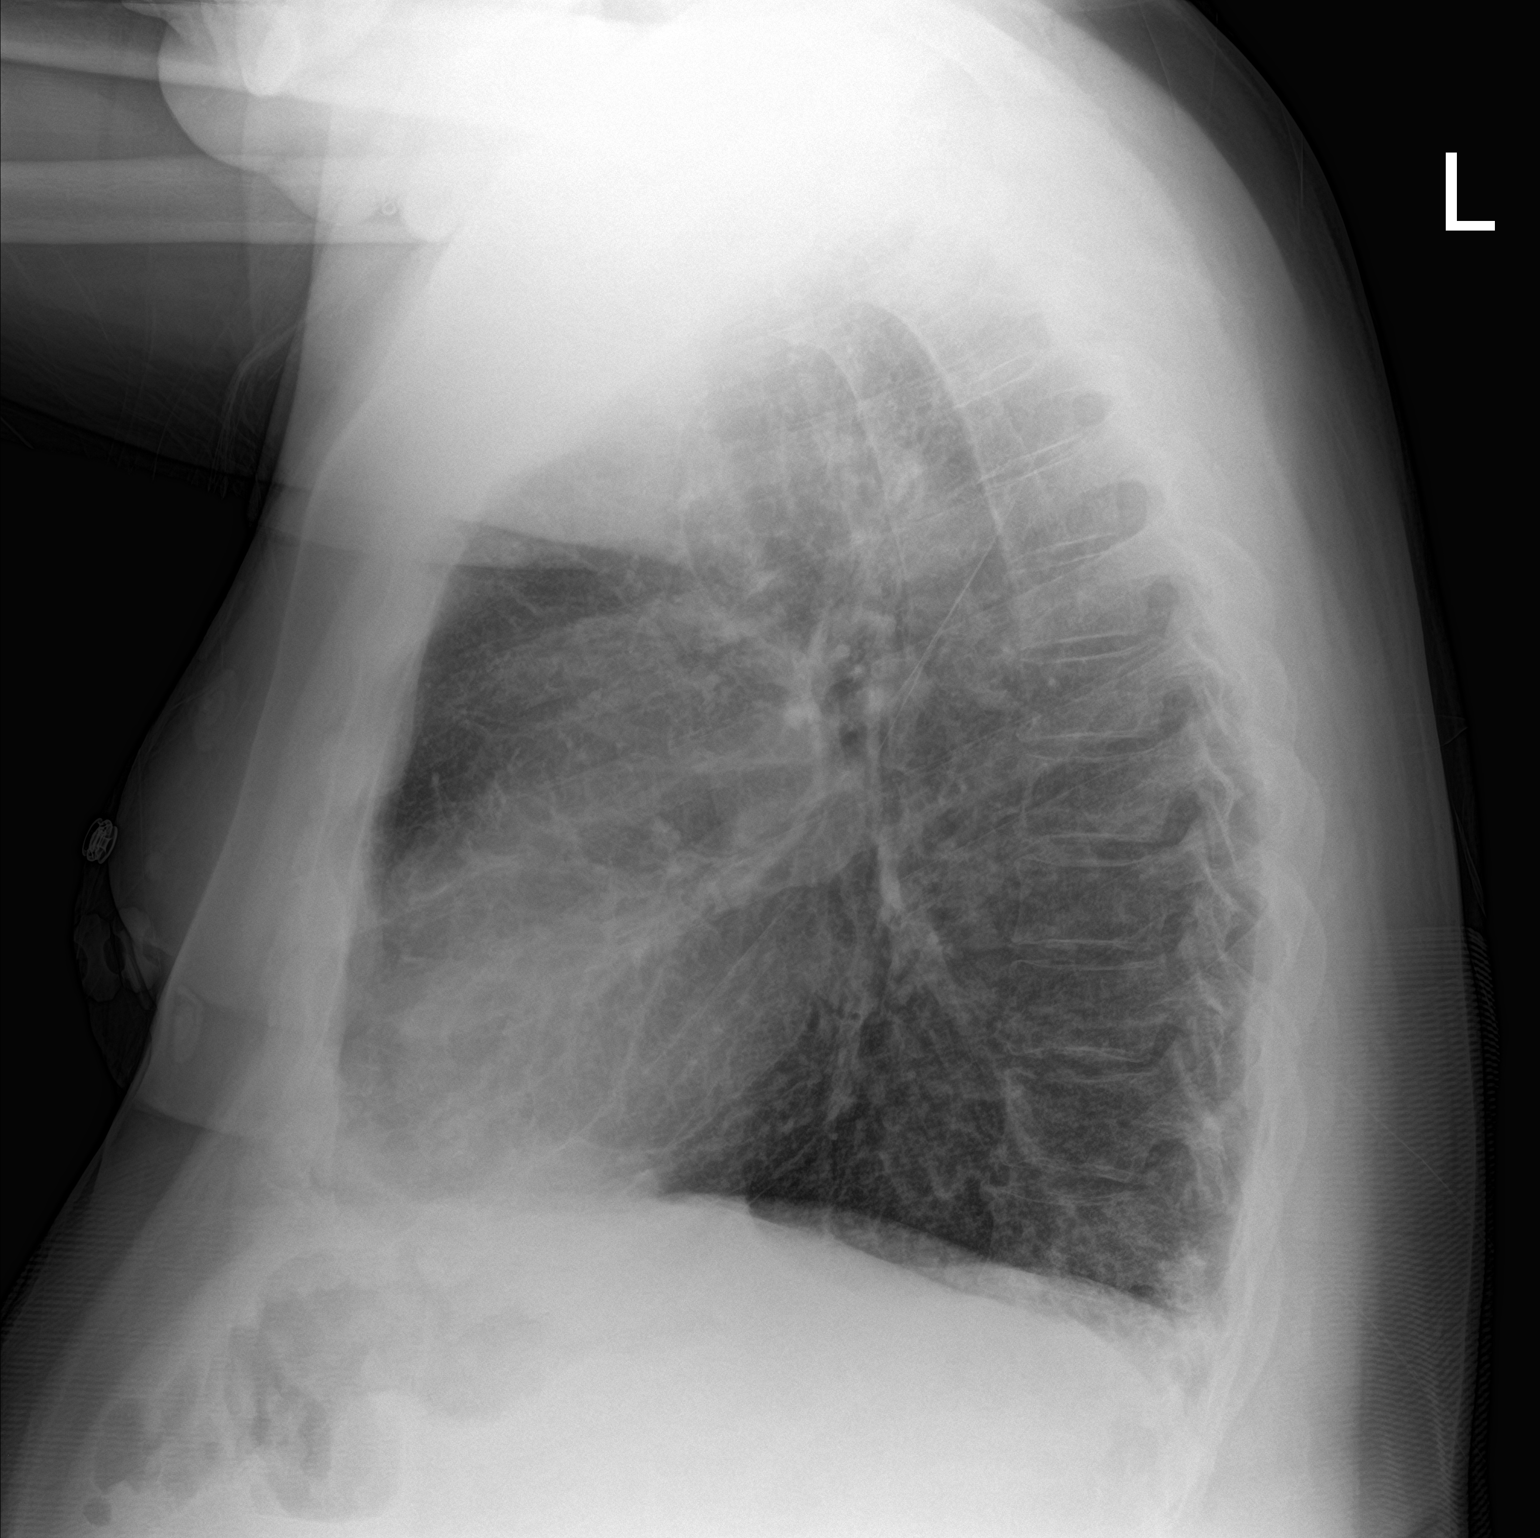

[2 of 2 positions shown; findings below may reference images not displayed]

FINDINGS: Mild changes of COPD. No active infiltrates or failure. Course
basilar lung markings are stable. Normal cardiomediastinal
silhouette. No effusion or pneumothorax. Bones unremarkable.
IMPRESSION: No active cardiopulmonary disease.  COPD.  Stable exam.

## 2016-12-23 IMAGING — CT CT ANGIO CHEST
1 of 8 series · 17 of 36 positions shown · IV contrast (Iodine)
Comparison: 05/10/2015 images of the lung bases

CONTRAST:  100 cc Isovue
CLINICAL DATA: Shortness of breath this morning, 2 weeks postop,
back pain, chest pain

EXAM:
CT ANGIOGRAPHY CHEST WITH CONTRAST
TECHNIQUE: Multidetector CT imaging of the chest was performed using the
standard protocol during bolus administration of intravenous
contrast. Multiplanar CT image reconstructions and MIPs were
obtained to evaluate the vascular anatomy.

[Series 407: thins pacs · axial · 0.75mm/px · z∈[+64,+343]mm · 17 of 315 slices shown]
[im 18/315  lung]
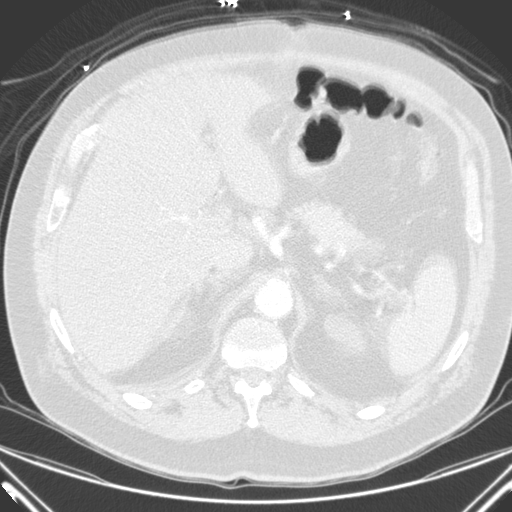
[im 35/315  mediastinal]
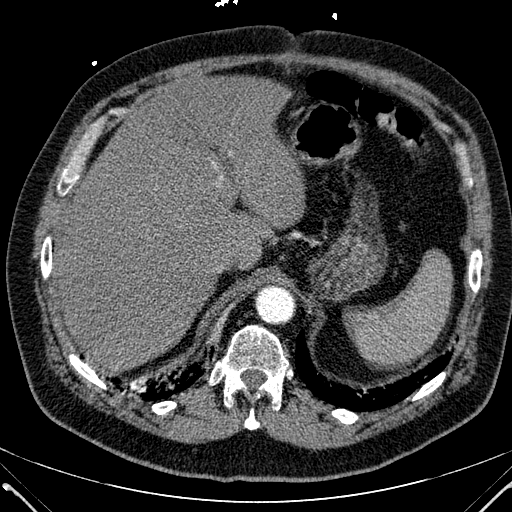
[im 53/315  lung]
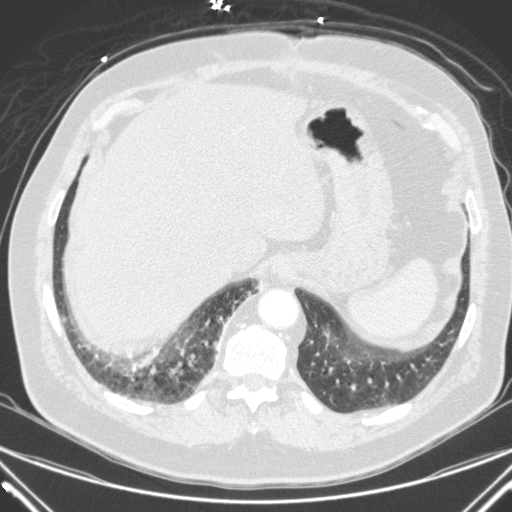
[im 70/315  mediastinal]
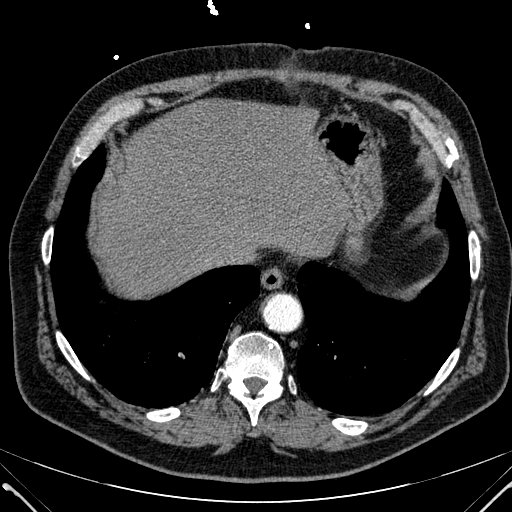
[im 88/315  lung]
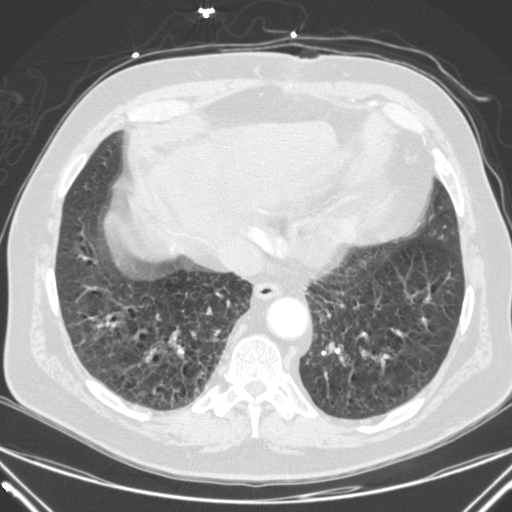
[im 105/315  mediastinal]
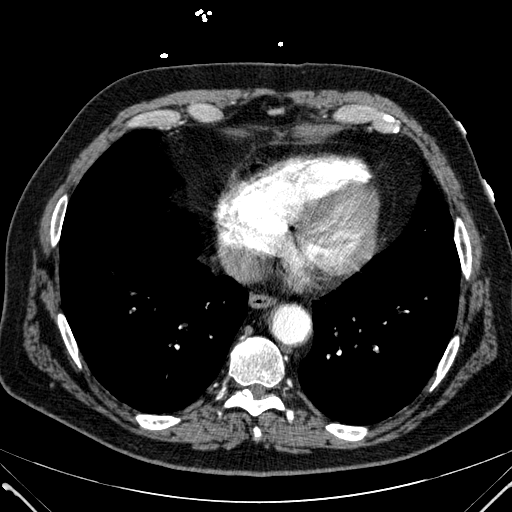
[im 123/315  lung]
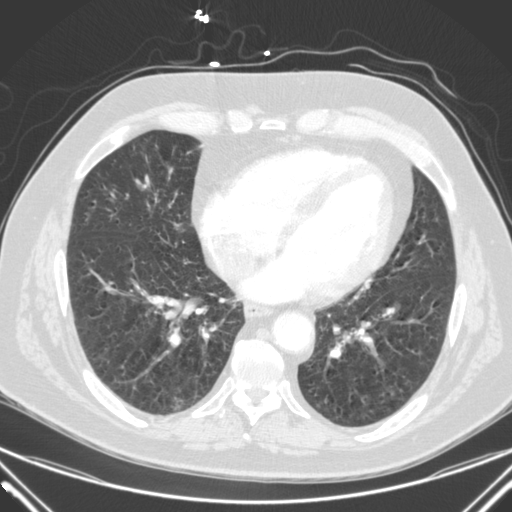
[im 140/315  mediastinal]
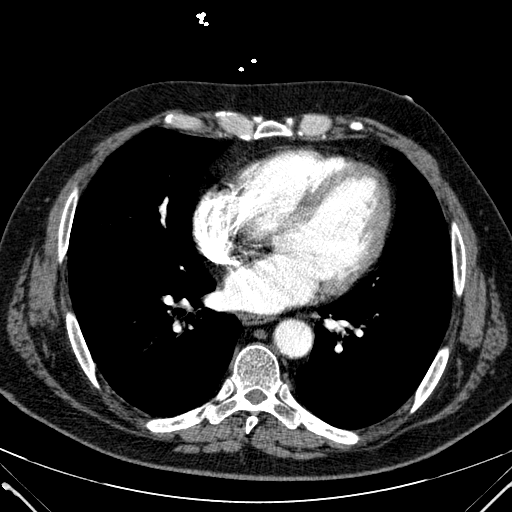
[im 158/315  lung]
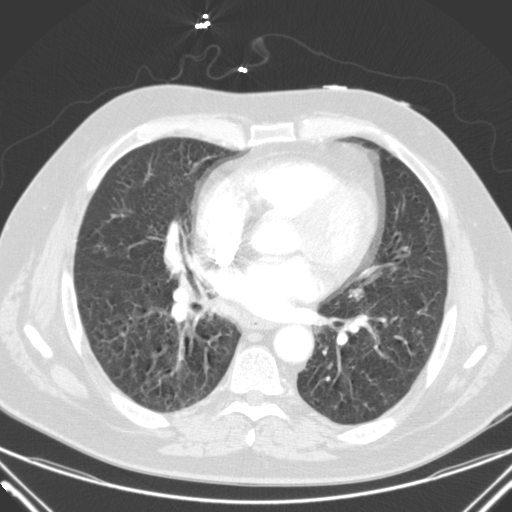
[im 175/315  mediastinal]
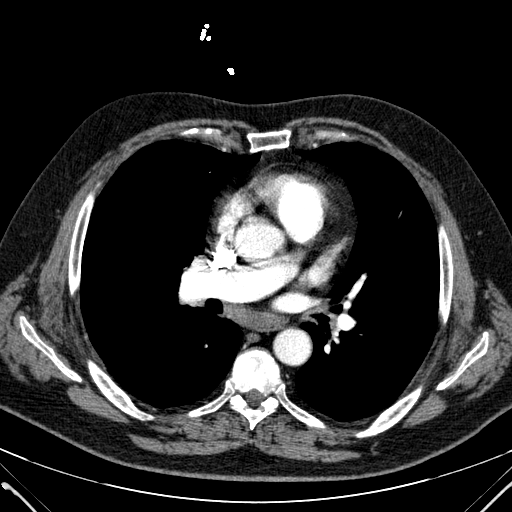
[im 192/315  lung]
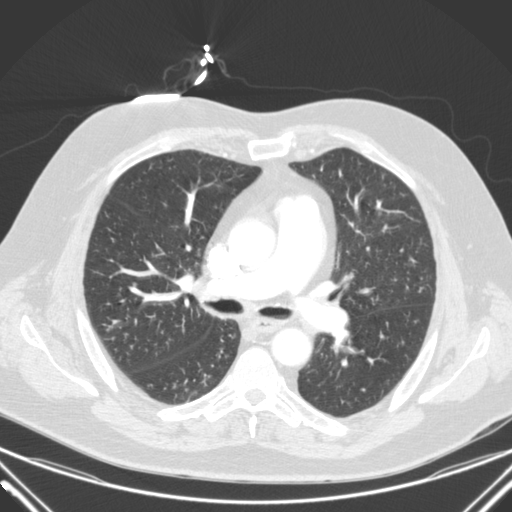
[im 210/315  mediastinal]
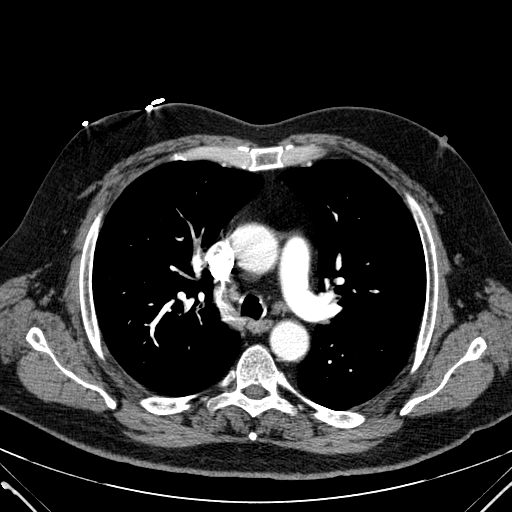
[im 227/315  lung]
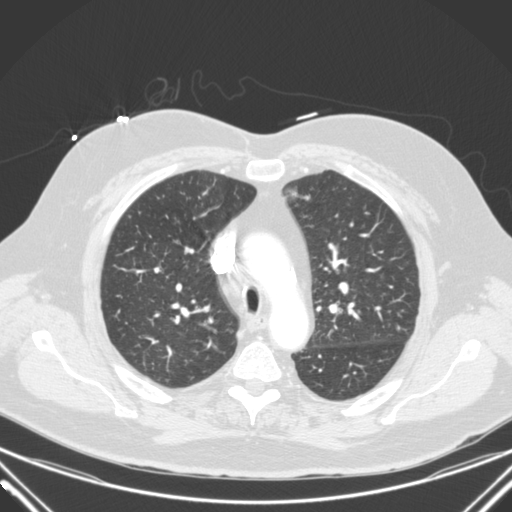
[im 245/315  mediastinal]
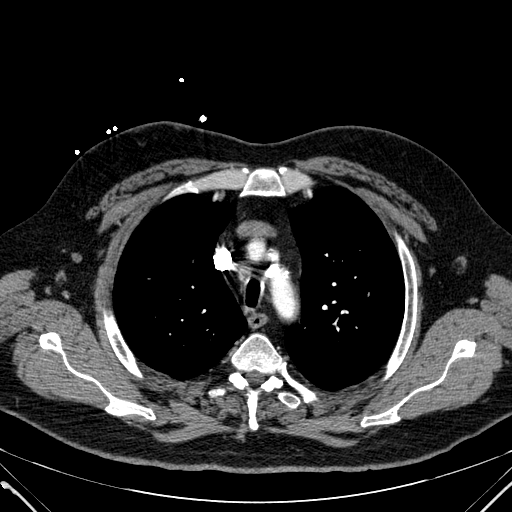
[im 262/315  lung]
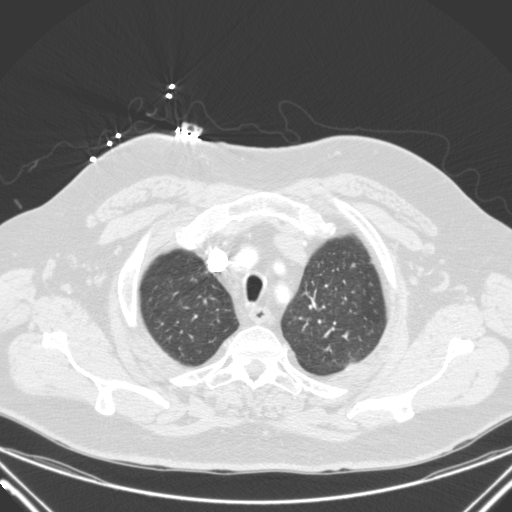
[im 280/315  mediastinal]
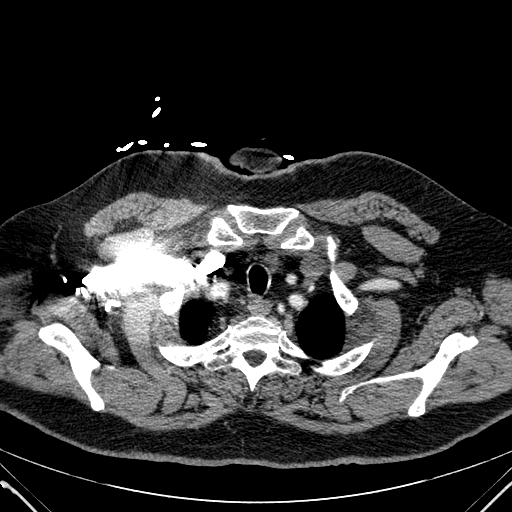
[im 297/315  lung]
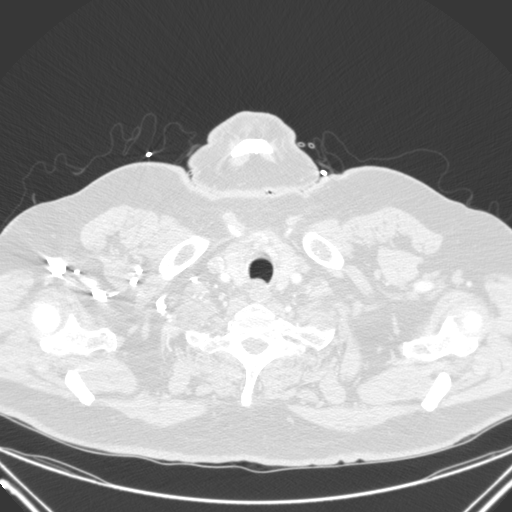

[17 of 36 positions shown; findings below may reference images not displayed]

FINDINGS: Mediastinum/Lymph Nodes: Atherosclerotic calcifications of thoracic
aorta. Heart size within normal limits. No pleural effusion.

There is no mediastinal hematoma or adenopathy. No hilar adenopathy.
The study is of excellent technical quality. No pulmonary embolus is
noted. Small nonspecific mediastinal lymph nodes are noted. A right
paratracheal lymph node measures 7.8 mm short-axis. AP window lymph
node measures 7 mm short-axis. These are not pathologic by size
criteria.

Lungs/Pleura: Images of the lung parenchyma shows no acute
infiltrate or pulmonary edema. Mild hyperinflation. There are
breathing motion artifacts. No pleural thickening no pleural
effusion. Mild atelectasis noted bilateral lower lobe posteriorly.
No bronchiectasis. No pneumothorax.

Upper abdomen: There is no evidence of adrenal gland mass in
visualized upper abdomen. Visualized liver and pancreas is
unremarkable. Trace perisplenic ascites.

Musculoskeletal: Sagittal images of the spine shows diffuse
osteopenia. Mild degenerative changes lower thoracic spine. Sagittal
view of the sternum is unremarkable.

Review of the MIP images confirms the above findings.
IMPRESSION: 1. No pulmonary embolus is noted.
2. Nonspecific mediastinal lymph nodes are noted not pathologic by
size criteria.
3. No infiltrate or pulmonary edema. Mild hyperinflation. Breathing
motion artifact are noted.
4. Mild degenerative changes thoracic spine.
5. Mild atherosclerotic calcifications of thoracic aorta.

## 2017-02-16 ENCOUNTER — Encounter (HOSPITAL_COMMUNITY): Payer: Self-pay | Admitting: Emergency Medicine

## 2017-02-16 ENCOUNTER — Emergency Department (HOSPITAL_COMMUNITY): Payer: Medicare Other

## 2017-02-16 ENCOUNTER — Inpatient Hospital Stay (HOSPITAL_COMMUNITY)
Admission: EM | Admit: 2017-02-16 | Discharge: 2017-02-17 | DRG: 191 | Disposition: A | Discharge status: Home / Self Care | Payer: Medicare Other | Attending: Family Medicine | Admitting: Family Medicine

## 2017-02-16 DIAGNOSIS — F17210 Nicotine dependence, cigarettes, uncomplicated: Secondary | ICD-10-CM | POA: Diagnosis present

## 2017-02-16 DIAGNOSIS — M1990 Unspecified osteoarthritis, unspecified site: Secondary | ICD-10-CM | POA: Diagnosis present

## 2017-02-16 DIAGNOSIS — Q245 Malformation of coronary vessels: Secondary | ICD-10-CM | POA: Diagnosis not present

## 2017-02-16 DIAGNOSIS — I10 Essential (primary) hypertension: Secondary | ICD-10-CM | POA: Diagnosis not present

## 2017-02-16 DIAGNOSIS — J449 Chronic obstructive pulmonary disease, unspecified: Secondary | ICD-10-CM | POA: Diagnosis not present

## 2017-02-16 DIAGNOSIS — Z8711 Personal history of peptic ulcer disease: Secondary | ICD-10-CM

## 2017-02-16 DIAGNOSIS — R000 Tachycardia, unspecified: Secondary | ICD-10-CM | POA: Diagnosis not present

## 2017-02-16 DIAGNOSIS — Z8261 Family history of arthritis: Secondary | ICD-10-CM

## 2017-02-16 DIAGNOSIS — J441 Chronic obstructive pulmonary disease with (acute) exacerbation: Principal | ICD-10-CM | POA: Diagnosis present

## 2017-02-16 DIAGNOSIS — R0789 Other chest pain: Secondary | ICD-10-CM | POA: Diagnosis not present

## 2017-02-16 DIAGNOSIS — I872 Venous insufficiency (chronic) (peripheral): Secondary | ICD-10-CM | POA: Diagnosis not present

## 2017-02-16 DIAGNOSIS — R7989 Other specified abnormal findings of blood chemistry: Secondary | ICD-10-CM

## 2017-02-16 DIAGNOSIS — I4891 Unspecified atrial fibrillation: Secondary | ICD-10-CM | POA: Diagnosis present

## 2017-02-16 DIAGNOSIS — H5462 Unqualified visual loss, left eye, normal vision right eye: Secondary | ICD-10-CM | POA: Diagnosis not present

## 2017-02-16 DIAGNOSIS — R0602 Shortness of breath: Secondary | ICD-10-CM | POA: Diagnosis not present

## 2017-02-16 DIAGNOSIS — N179 Acute kidney failure, unspecified: Secondary | ICD-10-CM | POA: Diagnosis not present

## 2017-02-16 DIAGNOSIS — I4892 Unspecified atrial flutter: Secondary | ICD-10-CM

## 2017-02-16 DIAGNOSIS — H5440 Blindness, one eye, unspecified eye: Secondary | ICD-10-CM | POA: Diagnosis present

## 2017-02-16 DIAGNOSIS — Z8249 Family history of ischemic heart disease and other diseases of the circulatory system: Secondary | ICD-10-CM

## 2017-02-16 DIAGNOSIS — I252 Old myocardial infarction: Secondary | ICD-10-CM | POA: Diagnosis not present

## 2017-02-16 DIAGNOSIS — R079 Chest pain, unspecified: Secondary | ICD-10-CM | POA: Diagnosis not present

## 2017-02-16 DIAGNOSIS — Z7982 Long term (current) use of aspirin: Secondary | ICD-10-CM | POA: Diagnosis not present

## 2017-02-16 DIAGNOSIS — Z79899 Other long term (current) drug therapy: Secondary | ICD-10-CM | POA: Diagnosis not present

## 2017-02-16 DIAGNOSIS — Z720 Tobacco use: Secondary | ICD-10-CM | POA: Diagnosis not present

## 2017-02-16 DIAGNOSIS — I700 Atherosclerosis of aorta: Secondary | ICD-10-CM | POA: Diagnosis not present

## 2017-02-16 DIAGNOSIS — Z8673 Personal history of transient ischemic attack (TIA), and cerebral infarction without residual deficits: Secondary | ICD-10-CM

## 2017-02-16 DIAGNOSIS — I483 Typical atrial flutter: Secondary | ICD-10-CM | POA: Diagnosis not present

## 2017-02-16 HISTORY — DX: Atherosclerosis of aorta: I70.0

## 2017-02-16 HISTORY — DX: Blindness, one eye, unspecified eye: H54.40

## 2017-02-16 HISTORY — DX: Other specified abnormal findings of blood chemistry: R79.89

## 2017-02-16 HISTORY — DX: Chronic or unspecified duodenal ulcer with perforation: K26.5

## 2017-02-16 LAB — CBC WITH DIFFERENTIAL/PLATELET
BASOS ABS: 0 10*3/uL (ref 0.0–0.1)
BASOS PCT: 0 %
EOS ABS: 0.2 10*3/uL (ref 0.0–0.7)
Eosinophils Relative: 2 %
HCT: 47.3 % (ref 39.0–52.0)
HEMOGLOBIN: 16.7 g/dL (ref 13.0–17.0)
Lymphocytes Relative: 21 %
Lymphs Abs: 2.3 10*3/uL (ref 0.7–4.0)
MCH: 34.9 pg — ABNORMAL HIGH (ref 26.0–34.0)
MCHC: 35.3 g/dL (ref 30.0–36.0)
MCV: 99 fL (ref 78.0–100.0)
Monocytes Absolute: 0.5 10*3/uL (ref 0.1–1.0)
Monocytes Relative: 5 %
Neutro Abs: 7.8 10*3/uL — ABNORMAL HIGH (ref 1.7–7.7)
Neutrophils Relative %: 72 %
Platelets: 214 10*3/uL (ref 150–400)
RBC: 4.78 MIL/uL (ref 4.22–5.81)
RDW: 13.8 % (ref 11.5–15.5)
WBC: 10.9 10*3/uL — AB (ref 4.0–10.5)

## 2017-02-16 LAB — COMPREHENSIVE METABOLIC PANEL
ALBUMIN: 3.7 g/dL (ref 3.5–5.0)
ALK PHOS: 71 U/L (ref 38–126)
ALT: 36 U/L (ref 17–63)
ANION GAP: 9 (ref 5–15)
AST: 21 U/L (ref 15–41)
BUN: 17 mg/dL (ref 6–20)
CALCIUM: 8.7 mg/dL — AB (ref 8.9–10.3)
CO2: 23 mmol/L (ref 22–32)
Chloride: 106 mmol/L (ref 101–111)
Creatinine, Ser: 1.32 mg/dL — ABNORMAL HIGH (ref 0.61–1.24)
GFR calc Af Amer: >60 mL/min (ref >60)
GFR calc non Af Amer: 54 mL/min — ABNORMAL LOW (ref >60)
GLUCOSE: 137 mg/dL — AB (ref 65–99)
Potassium: 4.2 mmol/L (ref 3.5–5.1)
SODIUM: 138 mmol/L (ref 135–145)
Total Bilirubin: 0.5 mg/dL (ref 0.3–1.2)
Total Protein: 6.8 g/dL (ref 6.5–8.1)

## 2017-02-16 LAB — I-STAT CG4 LACTIC ACID, ED
LACTIC ACID, VENOUS: 2.67 mmol/L — AB (ref 0.5–1.9)
Lactic Acid, Venous: 2.52 mmol/L (ref 0.5–1.9)

## 2017-02-16 LAB — I-STAT TROPONIN, ED
TROPONIN I, POC: 0 ng/mL (ref 0.00–0.08)
Troponin i, poc: 0 ng/mL (ref 0.00–0.08)

## 2017-02-16 LAB — TROPONIN I: Troponin I: <0.03 ng/mL (ref <0.03)

## 2017-02-16 LAB — BRAIN NATRIURETIC PEPTIDE: B NATRIURETIC PEPTIDE 5: 64.3 pg/mL (ref 0.0–100.0)

## 2017-02-16 LAB — PROTIME-INR
INR: 1
PROTHROMBIN TIME: 13.1 s (ref 11.4–15.2)

## 2017-02-16 LAB — LIPASE, BLOOD: Lipase: 19 U/L (ref 11–51)

## 2017-02-16 LAB — MRSA PCR SCREENING: MRSA by PCR: NEGATIVE

## 2017-02-16 LAB — MAGNESIUM: MAGNESIUM: 1.9 mg/dL (ref 1.7–2.4)

## 2017-02-16 MED ORDER — METHYLPREDNISOLONE SODIUM SUCC 125 MG IJ SOLR
125.0000 mg | Freq: Once | INTRAMUSCULAR | Status: AC
  Administered 2017-02-16: 125 mg via INTRAVENOUS
  Filled 2017-02-16: qty 2

## 2017-02-16 MED ORDER — FLUOXETINE HCL 20 MG PO CAPS: 40.0000 mg | ORAL_CAPSULE | Freq: Every day | ORAL | Status: DC

## 2017-02-16 MED ORDER — FLUOXETINE HCL 20 MG PO CAPS
40.0000 mg | ORAL_CAPSULE | Freq: Every day | ORAL | Status: DC
  Administered 2017-02-17: 40 mg via ORAL
  Filled 2017-02-16: qty 2

## 2017-02-16 MED ORDER — PANTOPRAZOLE SODIUM 40 MG PO TBEC
40.0000 mg | DELAYED_RELEASE_TABLET | Freq: Two times a day (BID) | ORAL | Status: DC
  Administered 2017-02-16 – 2017-02-17 (×2): 40 mg via ORAL
  Filled 2017-02-16 (×2): qty 1

## 2017-02-16 MED ORDER — BUPROPION HCL ER (XL) 150 MG PO TB24
150.0000 mg | ORAL_TABLET | Freq: Every day | ORAL | Status: DC
Start: 2017-02-16 — End: 2017-02-17
  Administered 2017-02-16 – 2017-02-17 (×2): 150 mg via ORAL
  Filled 2017-02-16 (×2): qty 1

## 2017-02-16 MED ORDER — METOPROLOL TARTRATE 25 MG PO TABS
25.0000 mg | ORAL_TABLET | Freq: Two times a day (BID) | ORAL | Status: DC
  Administered 2017-02-16 – 2017-02-17 (×2): 25 mg via ORAL
  Filled 2017-02-16 (×2): qty 1

## 2017-02-16 MED ORDER — ACETAMINOPHEN 650 MG RE SUPP: 650.0000 mg | Freq: Four times a day (QID) | RECTAL | Status: DC | PRN

## 2017-02-16 MED ORDER — SODIUM CHLORIDE 0.9% FLUSH: 3.0000 mL | INTRAVENOUS | Status: DC | PRN

## 2017-02-16 MED ORDER — ISOSORBIDE MONONITRATE ER 60 MG PO TB24: 60.0000 mg | ORAL_TABLET | Freq: Every day | ORAL | Status: DC

## 2017-02-16 MED ORDER — SODIUM CHLORIDE 0.9% FLUSH
3.0000 mL | Freq: Two times a day (BID) | INTRAVENOUS | Status: DC
  Administered 2017-02-16: 3 mL via INTRAVENOUS

## 2017-02-16 MED ORDER — IPRATROPIUM-ALBUTEROL 0.5-2.5 (3) MG/3ML IN SOLN
3.0000 mL | Freq: Once | RESPIRATORY_TRACT | Status: AC
  Administered 2017-02-16: 3 mL via RESPIRATORY_TRACT
  Filled 2017-02-16: qty 3

## 2017-02-16 MED ORDER — APIXABAN 5 MG PO TABS
5.0000 mg | ORAL_TABLET | Freq: Two times a day (BID) | ORAL | Status: DC
  Administered 2017-02-16 – 2017-02-17 (×2): 5 mg via ORAL
  Filled 2017-02-16 (×3): qty 1

## 2017-02-16 MED ORDER — LISINOPRIL 20 MG PO TABS
20.0000 mg | ORAL_TABLET | Freq: Every day | ORAL | Status: DC
Start: 2017-02-16 — End: 2017-02-16

## 2017-02-16 MED ORDER — DILTIAZEM HCL-DEXTROSE 100-5 MG/100ML-% IV SOLN (PREMIX)
5.0000 mg/h | INTRAVENOUS | Status: DC
  Administered 2017-02-16: 5 mg/h via INTRAVENOUS
  Filled 2017-02-16: qty 100

## 2017-02-16 MED ORDER — PREDNISONE 20 MG PO TABS: 40.0000 mg | ORAL_TABLET | Freq: Every day | ORAL | Status: DC

## 2017-02-16 MED ORDER — SODIUM CHLORIDE 0.9 % IV BOLUS (SEPSIS)
1000.0000 mL | Freq: Once | INTRAVENOUS | Status: AC
  Administered 2017-02-16: 1000 mL via INTRAVENOUS

## 2017-02-16 MED ORDER — NICOTINE 14 MG/24HR TD PT24
14.0000 mg | MEDICATED_PATCH | Freq: Once | TRANSDERMAL | Status: DC
  Administered 2017-02-16: 14 mg via TRANSDERMAL
  Filled 2017-02-16: qty 1

## 2017-02-16 MED ORDER — METHYLPREDNISOLONE SODIUM SUCC 40 MG IJ SOLR
40.0000 mg | Freq: Four times a day (QID) | INTRAMUSCULAR | Status: DC
  Administered 2017-02-16 – 2017-02-17 (×3): 40 mg via INTRAVENOUS
  Filled 2017-02-16 (×3): qty 1

## 2017-02-16 MED ORDER — ONDANSETRON HCL 4 MG/2ML IJ SOLN: 4.0000 mg | Freq: Four times a day (QID) | INTRAMUSCULAR | Status: DC | PRN

## 2017-02-16 MED ORDER — ISOSORBIDE MONONITRATE ER 60 MG PO TB24
60.0000 mg | ORAL_TABLET | Freq: Every day | ORAL | Status: DC
  Administered 2017-02-17: 60 mg via ORAL
  Filled 2017-02-16: qty 1

## 2017-02-16 MED ORDER — SODIUM CHLORIDE 0.9 % IV SOLN: 250.0000 mL | INTRAVENOUS | Status: DC | PRN

## 2017-02-16 MED ORDER — FUROSEMIDE 20 MG PO TABS: 20.0000 mg | ORAL_TABLET | Freq: Every day | ORAL | Status: DC

## 2017-02-16 MED ORDER — FUROSEMIDE 20 MG PO TABS
20.0000 mg | ORAL_TABLET | Freq: Every day | ORAL | Status: DC
  Administered 2017-02-17: 20 mg via ORAL
  Filled 2017-02-16: qty 1

## 2017-02-16 MED ORDER — POTASSIUM CHLORIDE CRYS ER 20 MEQ PO TBCR
20.0000 meq | EXTENDED_RELEASE_TABLET | Freq: Two times a day (BID) | ORAL | Status: DC
  Administered 2017-02-16 – 2017-02-17 (×2): 20 meq via ORAL
  Filled 2017-02-16 (×2): qty 1

## 2017-02-16 MED ORDER — IPRATROPIUM-ALBUTEROL 0.5-2.5 (3) MG/3ML IN SOLN
3.0000 mL | Freq: Four times a day (QID) | RESPIRATORY_TRACT | Status: DC
  Administered 2017-02-16 – 2017-02-17 (×3): 3 mL via RESPIRATORY_TRACT
  Filled 2017-02-16 (×3): qty 3

## 2017-02-16 MED ORDER — DILTIAZEM HCL ER COATED BEADS 120 MG PO CP24
120.0000 mg | ORAL_CAPSULE | Freq: Every day | ORAL | Status: DC
  Administered 2017-02-16 – 2017-02-17 (×2): 120 mg via ORAL
  Filled 2017-02-16 (×3): qty 1

## 2017-02-16 MED ORDER — DILTIAZEM LOAD VIA INFUSION
10.0000 mg | Freq: Once | INTRAVENOUS | Status: AC
  Administered 2017-02-16: 10 mg via INTRAVENOUS
  Filled 2017-02-16: qty 10

## 2017-02-16 MED ORDER — LISINOPRIL 20 MG PO TABS
20.0000 mg | ORAL_TABLET | Freq: Every day | ORAL | Status: DC
  Administered 2017-02-17: 20 mg via ORAL
  Filled 2017-02-16: qty 1

## 2017-02-16 MED ORDER — NICOTINE 21 MG/24HR TD PT24
21.0000 mg | MEDICATED_PATCH | Freq: Every day | TRANSDERMAL | Status: DC
  Administered 2017-02-17: 21 mg via TRANSDERMAL
  Filled 2017-02-16: qty 1

## 2017-02-16 MED ORDER — ATORVASTATIN CALCIUM 20 MG PO TABS
20.0000 mg | ORAL_TABLET | Freq: Every day | ORAL | Status: DC
  Administered 2017-02-16 – 2017-02-17 (×2): 20 mg via ORAL
  Filled 2017-02-16 (×2): qty 1

## 2017-02-16 MED ORDER — ONDANSETRON HCL 4 MG PO TABS: 4.0000 mg | ORAL_TABLET | Freq: Four times a day (QID) | ORAL | Status: DC | PRN

## 2017-02-16 MED ORDER — AMLODIPINE BESYLATE 10 MG PO TABS
10.0000 mg | ORAL_TABLET | Freq: Every day | ORAL | Status: DC
  Administered 2017-02-16 – 2017-02-17 (×2): 10 mg via ORAL
  Filled 2017-02-16 (×2): qty 1

## 2017-02-16 MED ORDER — ACETAMINOPHEN 325 MG PO TABS
650.0000 mg | ORAL_TABLET | Freq: Four times a day (QID) | ORAL | Status: DC | PRN
Start: 2017-02-16 — End: 2017-02-17

## 2017-02-16 MED ORDER — IOPAMIDOL (ISOVUE-370) INJECTION 76%
INTRAVENOUS | Status: AC
  Administered 2017-02-16: 100 mL
  Filled 2017-02-16: qty 100

## 2017-02-16 MED ORDER — ALBUTEROL SULFATE (2.5 MG/3ML) 0.083% IN NEBU: 2.5000 mg | INHALATION_SOLUTION | RESPIRATORY_TRACT | Status: DC | PRN

## 2017-02-16 NOTE — ED Notes (Signed)
Admitting MD at the bedside.  

## 2017-02-16 NOTE — H&P (Signed)
History and Physical  Gregory Lane KDT:[id number] DOB: 1950-12-13 DOA: 02/16/2017  PCP: [name], MD  Patient coming from: home  Chief Complaint: short of breath  HPI:  53yom PMH COPD, ongoing smoker, stroke, MI, presented with sudden onset SOB, chest pain. Found to have new aflutter/RVR, elevated lactic acid, COPD exacerbation.  Patient feeling fine until today at church all of a sudden had acute SOB, dizziness, lightheadedness and CP 7-8/10, aching. No specific aggravating or alleviating facts. Pain relieved in ED with treatment.  Seen by cardiology with recs for rate control, anticoagulation and COPD treatment.  ED Course: treated with diltiazem, nebs, steroids, fluids  Review of Systems:  Negative for fever, new visual changes, sore throat, rash, new muscle aches, dysuria, bleeding, n/v/abdominal pain.  Positive for heavy sweating  Past Medical History:  Diagnosis Date  . Arthritis   . Blind left eye   . Hypertension   . Myocardial infarction ([place])   . Perforated duodenal ulcer ([place])   . Stroke [facility])     Past Surgical History:  Procedure Laterality Date  . EXPLORATORY LAPAROTOMY  05/12/2015  . EYE SURGERY    . FRACTURE SURGERY    . LAPAROTOMY N/A 05/10/2015   Procedure: EXPLORATORY LAPAROTOMY;  Surgeon: Ralene Ok, MD;  Location: [place];  Service: General;  Laterality: N/A;  . LEFT HEART CATH AND CORONARY ANGIOGRAPHY N/A 05/02/2016   Procedure: Left Heart Cath and Coronary Angiography;  Surgeon: Adrian Prows, MD;  Location: [facility];  Service: Cardiovascular;  Laterality: N/A;     reports that he has been smoking cigarettes.  He has a 55.00 pack-year smoking history. he has never used smokeless tobacco. He reports that he does not drink alcohol or use drugs.   No Known Allergies  Family History  Problem Relation Age of Onset  . Cirrhosis Father   . Hypertension Mother   . Lung cancer Sister   . Hypertension Sister   . Arthritis Sister    . Heart murmur Sister      Prior to Admission medications   Medication Sig Start Date End Date Taking? Authorizing Provider  albuterol (PROVENTIL HFA;VENTOLIN HFA) 108 (90 Base) MCG/ACT inhaler Inhale 2 puffs into the lungs every 6 (six) hours as needed for wheezing or shortness of breath. 05/18/15  Yes Earnstine Regal, PA-C  amLODipine (NORVASC) 10 MG tablet Take 1 tablet (10 mg total) by mouth daily. 05/18/15   Earnstine Regal, PA-C  aspirin EC 81 MG tablet Take 81 mg by mouth daily.    [provider]  atorvastatin (LIPITOR) 20 MG tablet Take 20 mg by mouth daily.    [provider]  buPROPion (WELLBUTRIN XL) 150 MG 24 hr tablet Take 150 mg by mouth daily.    [provider]  FLUoxetine (PROZAC) 20 MG capsule Take 40 mg by mouth daily.    [provider]  furosemide (LASIX) 20 MG tablet Take 20 mg by mouth daily.    [provider]  isosorbide mononitrate (IMDUR) 60 MG 24 hr tablet Take 60 mg by mouth daily.    [provider]  lisinopril (PRINIVIL,ZESTRIL) 20 MG tablet Take 1 tablet (20 mg total) by mouth daily. 05/18/15   Earnstine Regal, PA-C  metoprolol tartrate (LOPRESSOR) 25 MG tablet Take 0.5 tablets (12.5 mg total) by mouth 2 (two) times daily. Patient taking differently: Take 25 mg by mouth 2 (two) times daily.  05/18/15   Earnstine Regal, PA-C  pantoprazole (PROTONIX) 40 MG tablet  Take 1 tablet (40 mg total) by mouth 2 (two) times daily. 05/18/15   Earnstine Regal, PA-C  potassium chloride SA (K-DUR,KLOR-CON) 20 MEQ tablet Take 1 tablet (20 mEq total) by mouth 2 (two) times daily. 05/18/15   Earnstine Regal, PA-C    Physical Exam:  Constitutional:   Appears calm and comfortable Eyes:  pupils and irises appear normal Normal lids  ENMT:  grossly normal hearing  Lips appear normal Neck:  neck appears normal, no masses no thyromegaly Respiratory:  CTA bilaterally, poor air movement, no w/r/r.  Respiratory  effort mildly increased.  Cardiovascular:  RRR, no m/r/g 1+ BLE extremity edema   Abdomen:  Soft, ntnd No hernia noted Vertical midline scar noted Musculoskeletal:  RUE, LUE, RLE, LLE   strength and tone normal, no atrophy, no abnormal movements No tenderness, masses Skin:  No rashes, lesions, ulcers palpation of skin: no induration or nodules Psychiatric:  Mental status Mood, affect appropriate judgement and insight appear normal    Wt Readings from Last 3 Encounters:  02/16/17 115.7 kg (255 lb)  05/02/16 116.1 kg (256 lb)  05/24/15 113.3 kg (249 lb 11.2 oz)    I have personally reviewed following labs and imaging studies  Labs:   Creatinine 1.32; baseline 0.7-0.8  troponins negative thus far  Lactic acid 2.52 >> 2.67  CBC unremarkable  Imaging studies:   CXR independently reviewed NAD  Medical tests:   EKG independently reviewed: Aflutter rapid rate, no acute changes     Principal Problem:   Atrial flutter with rapid ventricular response (HCC) Active Problems:   Cigarette smoker   COPD with acute exacerbation (HCC)   Elevated lactic acid level   Aortic atherosclerosis (HCC)   Assessment/Plan Atrial flutter with RVR, new dx with associated CP. No evidence of ACS. - check echo, TSH - med Rx per cardiology - trend troponin  COPD exacerbation - appears much improved compared to EDP description. - continue bronchodilators, steroids - based on exam and history of sudden onset of symptoms with CP I suspect this is a minor factor and that aflutter is driving force this admission  Elevated lactic acid - completely asymptomatic; afebrile, VSS, no abd pain and exam benign, WBC minimally elevated, CO2 WNL - no reason to suspect infection or severe illness - trend lactic acid  Aortic atherosclerosis   Severity of Illness: The appropriate patient status for this patient is INPATIENT. Inpatient status is judged to be reasonable and necessary in order to  provide the required intensity of service to ensure the patient's safety. The patient's presenting symptoms, physical exam findings, and initial radiographic and laboratory data in the context of their chronic comorbidities is felt to place them at high risk for further clinical deterioration. Furthermore, it is not anticipated that the patient will be medically stable for discharge from the hospital within 2 midnights of admission. The following factors support the patient status of inpatient.   " The patient's presenting symptoms include CP, SOB. " The worrisome physical exam findings include tachycardia, wheezing in ED. " The initial radiographic and laboratory data are worrisome because of lactate elevated. " The chronic co-morbidities include COPD.   * I certify that at the point of admission it is my clinical judgment that the patient will require inpatient hospital care spanning beyond 2 midnights from the point of admission due to high intensity of service, high risk for further deterioration and high frequency of surveillance required.*     DVT prophylaxis:apixaban Code Status: full  Family Communication: sister-in-law at bedside Consults called: cardiology    Time spent: 30 minutes  Murray Hodgkins, MD  [facility] Direct contact: [phone] --[place]ia [place]  --[email or web address]; password [email or web address]  7PM-7AM contact night coverage as above  02/16/2017, 6:38 PM

## 2017-02-16 NOTE — ED Notes (Signed)
ED Provider at bedside. 

## 2017-02-16 NOTE — ED Notes (Signed)
Lab called stating pt/inr tube was not full enough and needed to be reordered and recollected.

## 2017-02-16 NOTE — ED Triage Notes (Signed)
Pt arrives via [facility] with c/o central chest pain that began at 0830 this am, pt took 3 sl nitro at one time, received 324mg  asa. Pt found to be in aflutter with rvr on monitor, pt reports hx of irregular heart rhythm. Pt a/ox4. HR 120-150, hx of mi

## 2017-02-16 NOTE — ED Notes (Signed)
Got patient into a gown on the monitor did ekg shown to Dr Tegeler patient is resting with call bell in reach  

## 2017-02-16 NOTE — ED Notes (Signed)
Patient placed on 2L O2 for comfort per Dr. Doreene Burke request.

## 2017-02-16 NOTE — Consult Note (Addendum)
Reason for Consult:Atrial flutter Referring Physician:[facility]  Gregory Lane is an 66 y.o. male.  HPI: 66 year old Caucasian male with tobacco abuse, COPD, prior suspected stroke, was brought to the emergency department after he started experiencing lightheadedness, central chest pain with radiation to left arm. He had been expressing increased cough with yellowish expectoration for last few days. Denies any fever, chills, other infection signs or symptoms. EKG shows atrial flutter with rapid ventricular rate. In the emergency department, he was started on diltiazem infusion with which is ventricular rate is now in the 80s.   Patient has recently undergone cardiac catheterization in March 2018 that showed patent epicardial coronary arteries, with sluggish flow suggestive of endothelial dysfunction.  Past Medical History:  Diagnosis Date  . Arthritis   . Hypertension   . Myocardial infarction ([place])   . Stroke [facility])     Past Surgical History:  Procedure Laterality Date  . EXPLORATORY LAPAROTOMY  05/12/2015  . EYE SURGERY    . FRACTURE SURGERY    . LAPAROTOMY N/A 05/10/2015   Procedure: EXPLORATORY LAPAROTOMY;  Surgeon: Ralene Ok, MD;  Location: [place];  Service: General;  Laterality: N/A;  . LEFT HEART CATH AND CORONARY ANGIOGRAPHY N/A 05/02/2016   Procedure: Left Heart Cath and Coronary Angiography;  Surgeon: Adrian Prows, MD;  Location: [place] [facility];  Service: Cardiovascular;  Laterality: N/A;    Family History  Problem Relation Age of Onset  . Cirrhosis Father   . Hypertension Mother   . Lung cancer Sister   . Hypertension Sister   . Arthritis Sister   . Heart murmur Sister     Social History:  reports that he has been smoking cigarettes.  He has a 55.00 pack-year smoking history. he has never used smokeless tobacco. He reports that he drinks about 36.0 oz of alcohol per week. He reports that he does not use drugs.  Allergies: No Known Allergies  Medications:  I have reviewed the patient's current medications.  No current facility-administered medications on file prior to encounter.    Current Outpatient Medications on File Prior to Encounter  Medication Sig Dispense Refill  . albuterol (PROVENTIL HFA;VENTOLIN HFA) 108 (90 Base) MCG/ACT inhaler Inhale 2 puffs into the lungs every 6 (six) hours as needed for wheezing or shortness of breath. 1 Inhaler 1  . amLODipine (NORVASC) 10 MG tablet Take 1 tablet (10 mg total) by mouth daily. 30 tablet 0  . aspirin EC 81 MG tablet Take 81 mg by mouth daily.    Marland Kitchen atorvastatin (LIPITOR) 20 MG tablet Take 20 mg by mouth daily.    Marland Kitchen buPROPion (WELLBUTRIN XL) 150 MG 24 hr tablet Take 150 mg by mouth daily.    Marland Kitchen FLUoxetine (PROZAC) 20 MG capsule Take 40 mg by mouth daily.    . furosemide (LASIX) 20 MG tablet Take 20 mg by mouth daily.    . isosorbide mononitrate (IMDUR) 60 MG 24 hr tablet Take 60 mg by mouth daily.    Marland Kitchen lisinopril (PRINIVIL,ZESTRIL) 20 MG tablet Take 1 tablet (20 mg total) by mouth daily. 30 tablet 0  . metoprolol tartrate (LOPRESSOR) 25 MG tablet Take 0.5 tablets (12.5 mg total) by mouth 2 (two) times daily. (Patient taking differently: Take 25 mg by mouth 2 (two) times daily. ) 15 tablet 0  . pantoprazole (PROTONIX) 40 MG tablet Take 1 tablet (40 mg total) by mouth 2 (two) times daily. 60 tablet 2  . potassium chloride SA (K-DUR,KLOR-CON) 20 MEQ  tablet Take 1 tablet (20 mEq total) by mouth 2 (two) times daily. 8 tablet 0     Results for orders placed or performed during the hospital encounter of 02/16/17 (from the past 48 hour(s))  CBC with Differential     Status: Abnormal   Collection Time: 02/16/17  1:17 PM  Result Value Ref Range   WBC 10.9 (H) 4.0 - 10.5 K/uL   RBC 4.78 4.22 - 5.81 MIL/uL   Hemoglobin 16.7 13.0 - 17.0 g/dL   HCT 47.3 39.0 - 52.0 %   MCV 99.0 78.0 - 100.0 fL   MCH 34.9 (H) 26.0 - 34.0 pg   MCHC 35.3 30.0 - 36.0 g/dL   RDW 13.8 11.5 - 15.5 %   Platelets 214 150 - 400  K/uL   Neutrophils Relative % 72 %   Neutro Abs 7.8 (H) 1.7 - 7.7 K/uL   Lymphocytes Relative 21 %   Lymphs Abs 2.3 0.7 - 4.0 K/uL   Monocytes Relative 5 %   Monocytes Absolute 0.5 0.1 - 1.0 K/uL   Eosinophils Relative 2 %   Eosinophils Absolute 0.2 0.0 - 0.7 K/uL   Basophils Relative 0 %   Basophils Absolute 0.0 0.0 - 0.1 K/uL  Comprehensive metabolic panel     Status: Abnormal   Collection Time: 02/16/17  1:17 PM  Result Value Ref Range   Sodium 138 135 - 145 mmol/L   Potassium 4.2 3.5 - 5.1 mmol/L   Chloride 106 101 - 111 mmol/L   CO2 23 22 - 32 mmol/L   Glucose, Bld 137 (H) 65 - 99 mg/dL   BUN 17 6 - 20 mg/dL   Creatinine, Ser 1.32 (H) 0.61 - 1.24 mg/dL   Calcium 8.7 (L) 8.9 - 10.3 mg/dL   Total Protein 6.8 6.5 - 8.1 g/dL   Albumin 3.7 3.5 - 5.0 g/dL   AST 21 15 - 41 U/L   ALT 36 17 - 63 U/L   Alkaline Phosphatase 71 38 - 126 U/L   Total Bilirubin 0.5 0.3 - 1.2 mg/dL   GFR calc non Af Amer 54 (L) >60 mL/min   GFR calc Af Amer >60 >60 mL/min    Comment: (NOTE) The eGFR has been calculated using the CKD EPI equation. This calculation has not been validated in all clinical situations. eGFR's persistently <60 mL/min signify possible Chronic Kidney Disease.    Anion gap 9 5 - 15  Lipase, blood     Status: None   Collection Time: 02/16/17  1:17 PM  Result Value Ref Range   Lipase 19 11 - 51 U/L  Brain natriuretic peptide     Status: None   Collection Time: 02/16/17  1:17 PM  Result Value Ref Range   B Natriuretic Peptide 64.3 0.0 - 100.0 pg/mL  Magnesium     Status: None   Collection Time: 02/16/17  1:17 PM  Result Value Ref Range   Magnesium 1.9 1.7 - 2.4 mg/dL  I-stat troponin, ED     Status: None   Collection Time: 02/16/17  1:24 PM  Result Value Ref Range   Troponin i, poc 0.00 0.00 - 0.08 ng/mL   Comment 3            Comment: Due to the release kinetics of cTnI, a negative result within the first hours of the onset of symptoms does not rule out myocardial  infarction with certainty. If myocardial infarction is still suspected, repeat the test at appropriate intervals.  I-Stat CG4 Lactic Acid, ED     Status: Abnormal   Collection Time: 02/16/17  1:26 PM  Result Value Ref Range   Lactic Acid, Venous 2.52 (HH) 0.5 - 1.9 mmol/L   Comment NOTIFIED PHYSICIAN     Dg Chest Portable 1 View  Result Date: 02/16/2017 CLINICAL DATA:  66 year old male with central chest pain since 0830 hours. Tachycardia. A flutter on presentation. EXAM: PORTABLE CHEST 1 VIEW COMPARISON:  CTA chest 05/24/2015 and earlier. FINDINGS: Portable AP semi upright view at 1318 hours. Stable cardiac size at the upper limits of normal. Other mediastinal contours are within normal limits. Visualized tracheal air column is within normal limits. Stable lung volumes. Allowing for portable technique the lungs are clear. No pneumothorax or pleural effusion. IMPRESSION: No acute cardiopulmonary abnormality. Electronically Signed   By: Genevie Ann M.D.   On: 02/16/2017 13:39    Lexiscan myoview stress test 04/14/2016: 1. The resting electrocardiogram demonstrated normal sinus rhythm, normal resting conduction, no resting arrhythmias and normal rest repolarization.  Stress EKG is non-diagnostic for ischemia as it a pharmacologic stress using Lexiscan. Stress symptoms included dyspnea. 2. Left ventricular cavity size is dilated at 12m stress images and measured 165 mL consistent with ischemic dilatation.  SPECT images demonstrate Medium perfusion abnormality of mild intensity in the basal anterior and mid anterior myocardial wall(s) on the stress images.  The defect is not present on the resting images consistent with ischemia.  Gated SPECT images reveal normal myocardial thickening and wall motion.  The left ventricular ejection fraction was calculated or visually estimated to be 65%. This is an intermediate risk study, clinical correlation recommended.  Procedures   Left Heart Cath and Coronary  Angiography  Conclusion   Coronary angiogram 05/02/2016: Normal coronary arteries, codominant system. Obtuse marginal has mild internal myocardial bridging. Normal LV systolic function, normal LVEDP.  Recommendation: Chest pain is clearly exertional and is improved with nitroglycerin, may suggest endothelial dysfunction and could be due to coronary spasm.  Patient also complained of significant loss of vision in his left eye that occurred a week ago. I suspect he probably has central retinal artery occlusion and stroke. He will need further evaluation of the same. However he stable for discharge. 40 mL contrast utilized.   EKG 02/16/2017: Atrial flutter with rapid ventricular rate. No signs of ischemia.  Review of Systems  Constitutional: Negative.  Negative for chills and fever.  Eyes:       Blind in left eye, diminished vision in right eye.  Respiratory: Positive for cough, sputum production and shortness of breath.   Cardiovascular: Positive for chest pain and palpitations. Negative for PND.  Gastrointestinal: Negative for blood in stool and heartburn.  Genitourinary: Negative.   Musculoskeletal: Negative.   Neurological:       Lightheadedness. Patient is blind in left eye, with diminished vision also and right eye.   Endo/Heme/Allergies: Does not bruise/bleed easily.  Psychiatric/Behavioral: The patient is not nervous/anxious.   All other systems reviewed and are negative.  Blood pressure 113/83, pulse 77, temperature 98.2 F (36.8 C), temperature source Oral, resp. rate 17, height '6\' 1"'$  (1.854 m), weight 115.7 kg (255 lb), SpO2 94 %. Physical Exam  Nursing note and vitals reviewed. Constitutional: He is oriented to person, place, and time. He appears well-developed.  Moderately obese  HENT:  Head: Normocephalic and atraumatic.  Eyes: Pupils are equal, round, and reactive to light.  Neck: Normal range of motion. Neck supple. No JVD present.  Cardiovascular:  Normal rate.   No murmur heard. Irregular rhythm Diminished distal pulses. Stasis dermatitis bilateral feet.   Respiratory: Effort normal. He has wheezes. He has no rales. He exhibits no tenderness.  Bilateral diminished breath sounds  GI: Soft. Bowel sounds are normal. There is no tenderness. There is no rebound.  Lymphadenopathy:    He has no cervical adenopathy.  Neurological: He is alert and oriented to person, place, and time. He has normal reflexes.  Blind in left eye, diminished vision in right eye. Otherwise no cranial nerve deficits.  Skin: Skin is warm and dry.  Psychiatric: He has a normal mood and affect.    Assessment: New onset atrial flutter with rapid ventricular rate. CHA2DS2VASc score 4, 4.8% annual stroke risk Lactic acid elevation: While thromboembolic phenomenon is possible, no other signs of gut ischemia. Chest pain: Likely supply demand mismatch in the setting of tachycardia. Negative troponin, no significant ischemic changes on EKG. COPD exacerbation Tobacco abuse History of stroke  Recommendation: Agree with admission to hospitalist service given suspected COPD exacerbation.  Agree with IV diltiazem for rate control. Transition to by mouth diltiazem as tolerated. Recommend diltiazem CD 120 mg at least 2 hours before discontinuing IV infusion. Given comorbidities and increased stroke risk, would avoid emergent cardioversion if rate control achieved with medications. He will need outpatient follow-up for consideration for elective cardioversion and/or flutter ablation. Recommend IV hydration to avoid hypotension. His risk factors for atrial flutter include obesity, COPD exacerbation hypertension, which need to be controlled. Continue baseline metoprolol 25 mg twice daily. Recommend Eliquis 5 mg twice daily for anticoagulation. I discussed risks/benefits of anticoagulation with the patient and his family.  Continue baseline management including aspirin, Lipitor, Imdur,  lisinopril, amlodipine.    Usiel Astarita J Sanjit Mcmichael 02/16/2017, 3:57 PM   Gregory Lane Esther Hardy, MD [facility] [place] Pager: [phone] Office: [phone] If no answer Cell [phone]

## 2017-02-16 NOTE — ED Provider Notes (Signed)
[facility] EMERGENCY DEPARTMENT Provider Note   CSN: [id number] Arrival date & time: 02/16/17  1301     History   Chief Complaint Chief Complaint  Patient presents with  . Atrial Flutter  . Chest Pain    HPI Gregory Lane is a 66 y.o. male.  The history is provided by the patient, medical records and the EMS personnel.  Shortness of Breath  This is a new problem. The average episode lasts 3 days. The problem occurs continuously.The current episode started more than 2 days ago. The problem has been gradually worsening. Associated symptoms include cough, sputum production, wheezing, chest pain and leg swelling. Pertinent negatives include no fever, no rhinorrhea, no neck pain, no hemoptysis, no syncope, no vomiting and no rash. He has tried nothing for the symptoms. He has had prior hospitalizations. Associated medical issues include COPD, CAD, heart failure and past MI. Associated medical issues do not include PE.    Past Medical History:  Diagnosis Date  . Arthritis   . Hypertension   . Myocardial infarction ([place])   . Stroke [facility])     Patient Active Problem List   Diagnosis Date Noted  . Angina pectoris ([employer]) 04/28/2016  . Productive cough   . Bilateral edema of lower extremity   . Hypoxia 05/24/2015  . SOB (shortness of breath) 05/24/2015  . COPD suggested by initial evaluation ([place]) 05/17/2015  . Essential hypertension 05/17/2015  . Tobacco abuse 05/17/2015    Past Surgical History:  Procedure Laterality Date  . EXPLORATORY LAPAROTOMY  05/12/2015  . EYE SURGERY    . FRACTURE SURGERY    . LAPAROTOMY N/A 05/10/2015   Procedure: EXPLORATORY LAPAROTOMY;  Surgeon: Ralene Ok, MD;  Location: [facility];  Service: General;  Laterality: N/A;  . LEFT HEART CATH AND CORONARY ANGIOGRAPHY N/A 05/02/2016   Procedure: Left Heart Cath and Coronary Angiography;  Surgeon: Adrian Prows, MD;  Location: [facility] CV LAB;  Service: Cardiovascular;  Laterality:  N/A;       Home Medications    Prior to Admission medications   Medication Sig Start Date End Date Taking? Authorizing Provider  albuterol (PROVENTIL HFA;VENTOLIN HFA) 108 (90 Base) MCG/ACT inhaler Inhale 2 puffs into the lungs every 6 (six) hours as needed for wheezing or shortness of breath. 05/18/15   Earnstine Regal, PA-C  amLODipine (NORVASC) 10 MG tablet Take 1 tablet (10 mg total) by mouth daily. 05/18/15   Earnstine Regal, PA-C  aspirin EC 81 MG tablet Take 81 mg by mouth daily.    [provider]  atorvastatin (LIPITOR) 20 MG tablet Take 20 mg by mouth daily.    [provider]  buPROPion (WELLBUTRIN XL) 150 MG 24 hr tablet Take 150 mg by mouth daily.    [provider]  FLUoxetine (PROZAC) 20 MG capsule Take 40 mg by mouth daily.    [provider]  furosemide (LASIX) 20 MG tablet Take 20 mg by mouth daily.    [provider]  isosorbide mononitrate (IMDUR) 60 MG 24 hr tablet Take 60 mg by mouth daily.    [provider]  lisinopril (PRINIVIL,ZESTRIL) 20 MG tablet Take 1 tablet (20 mg total) by mouth daily. 05/18/15   Earnstine Regal, PA-C  metoprolol tartrate (LOPRESSOR) 25 MG tablet Take 0.5 tablets (12.5 mg total) by mouth 2 (two) times daily. Patient taking differently: Take 25 mg by mouth 2 (two) times daily.  05/18/15   Earnstine Regal, PA-C  pantoprazole (PROTONIX) 40  MG tablet Take 1 tablet (40 mg total) by mouth 2 (two) times daily. 05/18/15   Earnstine Regal, PA-C  potassium chloride SA (K-DUR,KLOR-CON) 20 MEQ tablet Take 1 tablet (20 mEq total) by mouth 2 (two) times daily. 05/18/15   Earnstine Regal, PA-C    Family History Family History  Problem Relation Age of Onset  . Cirrhosis Father   . Hypertension Mother   . Lung cancer Sister   . Hypertension Sister   . Arthritis Sister   . Heart murmur Sister     Social History Social History   Tobacco Use  . Smoking status: Current Every Day Smoker     Packs/day: 1.00    Years: 55.00    Pack years: 55.00    Types: Cigarettes  . Smokeless tobacco: Never Used  Substance Use Topics  . Alcohol use: Yes    Alcohol/week: 36.0 oz    Types: 60 Standard drinks or equivalent per week    Comment: daily 12 pk of beer  . Drug use: No     Allergies   Patient has no known allergies.   Review of Systems Review of Systems  Constitutional: Positive for chills, diaphoresis and fatigue. Negative for fever.  HENT: Negative for congestion and rhinorrhea.   Eyes: Negative for visual disturbance.  Respiratory: Positive for cough, sputum production, chest tightness, shortness of breath and wheezing. Negative for hemoptysis and stridor.   Cardiovascular: Positive for chest pain and leg swelling. Negative for palpitations and syncope.  Gastrointestinal: Positive for nausea. Negative for constipation, diarrhea and vomiting.  Genitourinary: Negative for flank pain.  Musculoskeletal: Negative for back pain, neck pain and neck stiffness.  Skin: Negative for rash and wound.  Neurological: Negative for light-headedness and numbness.  Psychiatric/Behavioral: Negative for agitation.  All other systems reviewed and are negative.    Physical Exam Updated Vital Signs BP 97/65 (BP Location: Right Arm)   Pulse (!) 128   Temp 98.2 F (36.8 C) (Oral)   Resp 20   Ht 6\' 1"  (1.854 m)   Wt 115.7 kg (255 lb)   SpO2 93%   BMI 33.64 kg/m   Physical Exam  Constitutional: He is oriented to person, place, and time. He appears well-developed and well-nourished. He appears distressed.  HENT:  Head: Normocephalic.  Mouth/Throat: Oropharynx is clear and moist. No oropharyngeal exudate.  Eyes: Conjunctivae and EOM are normal. Pupils are equal, round, and reactive to light.  Neck: Normal range of motion.  Cardiovascular: Intact distal pulses. An irregular rhythm present. Tachycardia present.  No murmur heard. Pulmonary/Chest: No stridor. He is in respiratory  distress. He has wheezes. He has no rhonchi. He has rales. He exhibits tenderness.  Abdominal: Soft. Bowel sounds are normal. He exhibits no distension. There is no tenderness. There is no guarding.  Musculoskeletal: He exhibits no edema or tenderness.  Neurological: He is alert and oriented to person, place, and time. No cranial nerve deficit or sensory deficit. He exhibits normal muscle tone.  Skin: Capillary refill takes less than 2 seconds. He is diaphoretic. No erythema. No pallor.  Nursing note and vitals reviewed.    ED Treatments / Results  Labs (all labs ordered are listed, but only abnormal results are displayed) Labs Reviewed  CBC WITH DIFFERENTIAL/PLATELET - Abnormal; Notable for the following components:      Result Value   WBC 10.9 (*)    MCH 34.9 (*)    Neutro Abs 7.8 (*)    All other components within normal  limits  COMPREHENSIVE METABOLIC PANEL - Abnormal; Notable for the following components:   Glucose, Bld 137 (*)    Creatinine, Ser 1.32 (*)    Calcium 8.7 (*)    GFR calc non Af Amer 54 (*)    All other components within normal limits  I-STAT CG4 LACTIC ACID, ED - Abnormal; Notable for the following components:   Lactic Acid, Venous 2.52 (*)    All other components within normal limits  I-STAT CG4 LACTIC ACID, ED - Abnormal; Notable for the following components:   Lactic Acid, Venous 2.67 (*)    All other components within normal limits  MRSA PCR SCREENING  LIPASE, BLOOD  BRAIN NATRIURETIC PEPTIDE  MAGNESIUM  PROTIME-INR  TROPONIN I  TROPONIN I  TROPONIN I  BASIC METABOLIC PANEL  CBC  TSH  I-STAT TROPONIN, ED  I-STAT TROPONIN, ED    EKG  EKG Interpretation  Date/Time:  Friday February 16 2017 13:01:56 EST Ventricular Rate:  139 PR Interval:    QRS Duration: 95 QT Interval:  337 QTC Calculation: 513 R Axis:   87 Text Interpretation:  Atrial flutter Borderline right axis deviation Borderline ST depression, diffuse leads Prolonged QT interval A  flutter with RVR.  No STEMI Confirmed by Antony Blackbird [phone]) on 02/16/2017 1:24:41 PM       Radiology Ct Angio Chest Pe W And/or Wo Contrast  Result Date: 02/16/2017 CLINICAL DATA:  Chest pain and shortness of breath since this morning. EXAM: CT ANGIOGRAPHY CHEST WITH CONTRAST TECHNIQUE: Multidetector CT imaging of the chest was performed using the standard protocol during bolus administration of intravenous contrast. Multiplanar CT image reconstructions and MIPs were obtained to evaluate the vascular anatomy. CONTRAST:  173mL ISOVUE-370 IOPAMIDOL (ISOVUE-370) INJECTION 76% COMPARISON:  Chest CT 05/24/2015 FINDINGS: Cardiovascular: The heart is normal in size. No pericardial effusion. The aorta is normal in caliber. Moderate scattered atherosclerotic calcifications. No obvious dissection. The pulmonary arterial tree is fairly well opacified. No filling defects to suggest pulmonary embolism. Mediastinum/Nodes: No mediastinal or hilar mass or adenopathy. The esophagus appears normal. Lungs/Pleura: No acute pulmonary findings. No worrisome pulmonary lesions. No pleural effusion or pulmonary edema. Upper Abdomen: No significant upper abdominal findings. Musculoskeletal: No chest wall mass, supraclavicular or axillary adenopathy. The bony thorax is intact. Review of the MIP images confirms the above findings. IMPRESSION: 1. No CT findings for pulmonary embolism. 2. Moderate scattered atherosclerotic calcifications involving the thoracic aorta but no aneurysm or dissection. 3. Scattered three-vessel coronary artery calcifications. 4. No acute pulmonary findings. Aortic Atherosclerosis (ICD10-I70.0). Electronically Signed   By: Marijo Sanes M.D.   On: 02/16/2017 16:03   Dg Chest Portable 1 View  Result Date: 02/16/2017 CLINICAL DATA:  66 year old male with central chest pain since 0830 hours. Tachycardia. A flutter on presentation. EXAM: PORTABLE CHEST 1 VIEW COMPARISON:  CTA chest 05/24/2015 and earlier.  FINDINGS: Portable AP semi upright view at 1318 hours. Stable cardiac size at the upper limits of normal. Other mediastinal contours are within normal limits. Visualized tracheal air column is within normal limits. Stable lung volumes. Allowing for portable technique the lungs are clear. No pneumothorax or pleural effusion. IMPRESSION: No acute cardiopulmonary abnormality. Electronically Signed   By: Genevie Ann M.D.   On: 02/16/2017 13:39    Procedures Procedures (including critical care time)  Medications Ordered in ED Medications  diltiazem (CARDIZEM) 1 mg/mL load via infusion 10 mg (10 mg Intravenous Bolus from Bag 02/16/17 1343)    And  diltiazem (CARDIZEM) 100  mg in dextrose 5% 188mL (1 mg/mL) infusion (0 mg/hr Intravenous Stopping Infusion hung by another clincian 02/16/17 2042)  diltiazem (CARDIZEM CD) 24 hr capsule 120 mg (120 mg Oral Given 02/16/17 1837)  apixaban (ELIQUIS) tablet 5 mg (5 mg Oral Given 02/16/17 1745)  amLODipine (NORVASC) tablet 10 mg (10 mg Oral Given 02/16/17 1836)  atorvastatin (LIPITOR) tablet 20 mg (20 mg Oral Given 02/16/17 1837)  buPROPion (WELLBUTRIN XL) 24 hr tablet 150 mg (150 mg Oral Given 02/16/17 1837)  pantoprazole (PROTONIX) EC tablet 40 mg (40 mg Oral Given 02/16/17 2100)  potassium chloride SA (K-DUR,KLOR-CON) CR tablet 20 mEq (20 mEq Oral Given 02/16/17 2100)  metoprolol tartrate (LOPRESSOR) tablet 25 mg (25 mg Oral Given 02/16/17 2100)  FLUoxetine (PROZAC) capsule 40 mg (not administered)  furosemide (LASIX) tablet 20 mg (not administered)  isosorbide mononitrate (IMDUR) 24 hr tablet 60 mg (not administered)  lisinopril (PRINIVIL,ZESTRIL) tablet 20 mg (not administered)  nicotine (NICODERM CQ - dosed in mg/24 hours) patch 21 mg (not administered)  methylPREDNISolone sodium succinate (SOLU-MEDROL) 40 mg/mL injection 40 mg (40 mg Intravenous Given 02/16/17 2049)    Followed by  predniSONE (DELTASONE) tablet 40 mg (not administered)  albuterol  (PROVENTIL) (2.5 MG/3ML) 0.083% nebulizer solution 2.5 mg (not administered)  ipratropium-albuterol (DUONEB) 0.5-2.5 (3) MG/3ML nebulizer solution 3 mL (3 mLs Nebulization Given 02/16/17 2151)  sodium chloride flush (NS) 0.9 % injection 3 mL (3 mLs Intravenous Given 02/16/17 2049)  sodium chloride flush (NS) 0.9 % injection 3 mL (3 mLs Intravenous Given 02/16/17 2049)  sodium chloride flush (NS) 0.9 % injection 3 mL (not administered)  0.9 %  sodium chloride infusion (not administered)  acetaminophen (TYLENOL) tablet 650 mg (not administered)    Or  acetaminophen (TYLENOL) suppository 650 mg (not administered)  ondansetron (ZOFRAN) tablet 4 mg (not administered)    Or  ondansetron (ZOFRAN) injection 4 mg (not administered)  sodium chloride 0.9 % bolus 1,000 mL (1,000 mLs Intravenous Transfusing/Transfer 02/16/17 1745)  ipratropium-albuterol (DUONEB) 0.5-2.5 (3) MG/3ML nebulizer solution 3 mL (3 mLs Nebulization Given 02/16/17 1352)  iopamidol (ISOVUE-370) 76 % injection (100 mLs  Contrast Given 02/16/17 1543)  methylPREDNISolone sodium succinate (SOLU-MEDROL) 125 mg/2 mL injection 125 mg (125 mg Intravenous Given 02/16/17 1655)  ipratropium-albuterol (DUONEB) 0.5-2.5 (3) MG/3ML nebulizer solution 3 mL (3 mLs Nebulization Given 02/16/17 1655)       CHA2Ds2-VASc Score for Atrial Fibrillation    Patient Score  Age <65 = 0 65-74 = 1 > 75 = 2 1  Sex Male = 0 Male = 1 0  CHF History No = 0  Yes = 1 0  HTN History No = 0  Yes = 1 1  Stroke/TIA/TE History No = 0  Yes = 1 1  Vascular Disease History No = 0  Yes = 1 0  Diabetes History No = 0  Yes = 1 0  Total:  3   3.2 % stroke rate/year from a score of 3   Initial Impression / Assessment and Plan / ED Course  I have reviewed the triage vital signs and the nursing notes.  Pertinent labs & imaging results that were available during my care of the patient were reviewed by me and considered in my medical decision making (see  chart for details).     Jacobo Moncrief Knoch is a 66 y.o. male with a past medical history significant for CAD with prior MI, COPD, stroke, hypertension, report of "blood clot problems", tobacco abuse, and chronic  arthritis who presents with chest pain, shortness of breath, lightheadedness, fatigue, productive cough, and diaphoresis.  Patient says that for the last few days he has been having chest pain on and off.  He reports that it worsened this morning around 8:30 AM.  It is a severe pressure in his left chest.  It does not radiate.  It is associated with shortness of breath lightheadedness and diaphoresis.  He denies nausea or vomiting.  He reports his legs have been bilaterally swollen more recently.  He says that he has not been taking his medications consistently due to cost problems.  He denies any recent trauma.  He does report some recent chills with his productive cough.  He denies hemoptysis.  Patient was found to be in A. fib with RVR by EMS.  Patient says that he has not had A. fib or a flutter in the past to his knowledge.  Patient received aspirin at home and took 3 nitroglycerin.  He reported developing headache after the nitrates.  On arrival, blood pressure found to be in the 80s, 67Y, and 19J systolic.  Patient was started on fluids.  Heart rate was found to be in a flutter with a rate in the 140s.  On exam, patient has edema in both legs.  Legs are nontender.  They are symmetric.  Patient has normal sensation and pulses in all extremities.  Patient's lungs are both wheezing and filled with rales in all lung fields.  Patient had difficult to auscultate heart sounds but did not hear significant murmur.  Patient was diaphoretic.  After initial exam, I am concerned about multiple problems.  I am concerned patient is in new a flutter with RVR but as has been having chest pain on and off for the last few days, it is unclear when he went into this arrhythmia.  He is not on blood thinners.  I am  also concerned patient is having a COPD exacerbation with his wheezing and shortness of breath with hypoxia in the 80s.  Patient is on nasal cannula oxygen at this time.  However I am also concerned about the patient's chest pain with hypoxia and fast heart rate in the setting of reported blood clot problems.  Patient will likely need PE study to rule out pulmonary embolism.  With the leg swelling and crackles in the lungs, I am concerned about CHF exacerbation.  Dr. Einar Gip with cardiology was called who reported that the patient had a reassuring catheter earlier this year.  He thinks this may be other etiologies causing his symptoms and recommended treating the COPD exacerbation with albuterol, given the patient fluids and diltiazem for the a flutter and hypotension, and looking for other etiologies.  He did not feel that the patient needed emergent cardioversion as an a-flutter with a rate in the 120 should not be the source of hypotension for this patient.  Anticipate patient will need admission after diagnostic testing is completed.  4:50 PM Patient's breathing improved after breathing treatment.  Patient continued to have severe wheezing.  Patient was given Solu-Medrol and a second DuoNeb.  Patient was placed on Cardizem with improvement in heart rate.  Patient remains in atrial fibrillation but heart rate improved into the 90s.  PE study revealed no evidence of pulmonary embolism.  No acute pulmonary findings seen.  Mild leukocytosis present.  Creatinine similar to prior at 1.32 and elect lites otherwise reassuring.  Surprisingly, BNP was not elevated, doubt CHF exacerbation.  Initial troponin was negative  but lactic acid was elevated.  Cardiology eval the patient and felt this is likely COPD exacerbation leading to his symptoms.  Given the rising lactate, they recommended watching to make sure he did not increase suggestive of embolic phenomena from the a flutter/fib with no anticoagulate.    They  are recommending initiation of blood thinners and admission to hospital under a medicine service for further management and management of COPD exacerbation.  Unassigned medicine team will be called for admission.   Final Clinical Impressions(s) / ED Diagnoses   Final diagnoses:  COPD exacerbation ([place])  Atrial fibrillation with RVR ([facility])     Clinical Impression: 1. COPD exacerbation ([place])   2. Atrial fibrillation with RVR ([place])     Disposition: Admit  This note was prepared with assistance of Dragon voice recognition software. Occasional wrong-word or sound-a-like substitutions may have occurred due to the inherent limitations of voice recognition software.      Tegeler, Gwenyth Allegra, MD 02/16/17 [phone]

## 2017-02-16 NOTE — Progress Notes (Signed)
Contacted [facility] in regards to cardizem gtt/PO cardizem. Orders given to administer PO cardizem per [facility] and turn the gtt off in two hours. Will administer PO and update oncoming RN.

## 2017-02-17 ENCOUNTER — Encounter (HOSPITAL_COMMUNITY): Payer: Self-pay | Admitting: Family Medicine

## 2017-02-17 ENCOUNTER — Other Ambulatory Visit: Payer: Self-pay

## 2017-02-17 ENCOUNTER — Inpatient Hospital Stay (HOSPITAL_COMMUNITY): Payer: Medicare Other

## 2017-02-17 DIAGNOSIS — R7989 Other specified abnormal findings of blood chemistry: Secondary | ICD-10-CM

## 2017-02-17 HISTORY — DX: Other specified abnormal findings of blood chemistry: R79.89

## 2017-02-17 LAB — BASIC METABOLIC PANEL
Anion gap: 11 (ref 5–15)
BUN: 12 mg/dL (ref 6–20)
CALCIUM: 8.9 mg/dL (ref 8.9–10.3)
CO2: 21 mmol/L — ABNORMAL LOW (ref 22–32)
CREATININE: 0.83 mg/dL (ref 0.61–1.24)
Chloride: 103 mmol/L (ref 101–111)
GFR calc Af Amer: >60 mL/min (ref >60)
Glucose, Bld: 207 mg/dL — ABNORMAL HIGH (ref 65–99)
POTASSIUM: 4.4 mmol/L (ref 3.5–5.1)
SODIUM: 135 mmol/L (ref 135–145)

## 2017-02-17 LAB — CBC
HEMATOCRIT: 46 % (ref 39.0–52.0)
Hemoglobin: 15.4 g/dL (ref 13.0–17.0)
MCH: 33.5 pg (ref 26.0–34.0)
MCHC: 33.5 g/dL (ref 30.0–36.0)
MCV: 100 fL (ref 78.0–100.0)
PLATELETS: 201 10*3/uL (ref 150–400)
RBC: 4.6 MIL/uL (ref 4.22–5.81)
RDW: 14.1 % (ref 11.5–15.5)
WBC: 8 10*3/uL (ref 4.0–10.5)

## 2017-02-17 LAB — ECHOCARDIOGRAM COMPLETE
HEIGHTINCHES: 73 in
WEIGHTICAEL: 4345.6 [oz_av]

## 2017-02-17 LAB — TSH: TSH: 0.26 u[IU]/mL — AB (ref 0.350–4.500)

## 2017-02-17 LAB — TROPONIN I: Troponin I: <0.03 ng/mL (ref <0.03)

## 2017-02-17 LAB — LACTIC ACID, PLASMA
Lactic Acid, Venous: 3.5 mmol/L (ref 0.5–1.9)
Lactic Acid, Venous: 3.1 mmol/L (ref 0.5–1.9)

## 2017-02-17 MED ORDER — PREDNISONE 20 MG PO TABS: 40.0000 mg | ORAL_TABLET | Freq: Every day | ORAL | Status: DC

## 2017-02-17 MED ORDER — OFF THE BEAT BOOK
Freq: Once | Status: AC
  Administered 2017-02-17: 07:00:00
  Filled 2017-02-17: qty 1

## 2017-02-17 MED ORDER — APIXABAN 5 MG PO TABS: 5.0000 mg | ORAL_TABLET | Freq: Two times a day (BID) | ORAL | 0 refills | Status: DC

## 2017-02-17 MED ORDER — DILTIAZEM HCL ER COATED BEADS 120 MG PO CP24: 120.0000 mg | ORAL_CAPSULE | Freq: Every day | ORAL | 0 refills | Status: DC

## 2017-02-17 MED ORDER — PREDNISONE 20 MG PO TABS: 40.0000 mg | ORAL_TABLET | Freq: Every day | ORAL | 0 refills | Status: DC

## 2017-02-17 MED ORDER — METHYLPREDNISOLONE SODIUM SUCC 40 MG IJ SOLR: 40.0000 mg | Freq: Four times a day (QID) | INTRAMUSCULAR | Status: DC

## 2017-02-17 MED ORDER — PERFLUTREN LIPID MICROSPHERE
1.0000 mL | INTRAVENOUS | Status: AC | PRN
  Administered 2017-02-17: 2 mL via INTRAVENOUS
  Filled 2017-02-17: qty 10

## 2017-02-17 MED ORDER — SODIUM CHLORIDE 0.9 % IV BOLUS (SEPSIS)
1000.0000 mL | Freq: Once | INTRAVENOUS | Status: AC
  Administered 2017-02-17: 1000 mL via INTRAVENOUS

## 2017-02-17 NOTE — Care Management Note (Signed)
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  Cardiology place on eliquis.  Pt states he usually uses mail order pharmacy.  Also uses [facility] on [address] as needed.  Pt states he is not able to afford some prescribed drugs such as inhalers and has been able to get inhalers samples from PMD office.  Pt states he will not be able to afford another high copay.  Action/Plan: Pt given 30-day free card and Copay card.  Explained to pt that he should call copay number during the next 30 days and try to lower copay.  Also, the cardiology office has samples to give pt per Dr. Virgina Jock.    Spoke with pharmacist at [facility] and Eliquis 5 mg is in stock there.  They are open until 6pm today and tomorrow.   Pharmacist states pt's Medicaid does not pay for drugs bc he is not "dual-eligible".  Pt will have the [place], but pharmacist unable to tell what that is.  Pt verbalizes understanding.   Expected Discharge Date:                  Expected Discharge Plan:  Home/Self Care  In-House Referral:  NA  Discharge planning Services  CM Consult, Medication Assistance  Post Acute Care Choice:  NA Choice offered to:  NA  DME Arranged:  N/A DME Agency:  NA  HH Arranged:  NA HH Agency:  NA  Status of Service:  Completed, signed off  If discussed at [place] of Stay Meetings, dates discussed:    Additional Comments:  Arley Phenix, RN 02/17/2017, 10:49 AM

## 2017-02-17 NOTE — Progress Notes (Signed)
Nutrition Brief Note  RD consulted for assessment of nutrition requirements/status.  Wt Readings from Last 15 Encounters:  02/17/17 271 lb 9.6 oz (123.2 kg)  05/02/16 256 lb (116.1 kg)  05/24/15 249 lb 11.2 oz (113.3 kg)  05/09/15 235 lb (106.6 kg)    Body mass index is 35.83 kg/m. Patient meets criteria for class II based on current BMI. Pt with no weight loss.   Current diet order is heart, patient is consuming approximately 100% of meals at this time. Pt reports having a good appetite currently and PTA with no other difficulties. Labs and medications reviewed. Plans for discharge home today.   No nutrition interventions warranted at this time. If nutrition issues arise, please consult RD.   Corrin Parker, MS, RD, LDN Pager # [phone] After hours/ weekend pager # [phone]

## 2017-02-17 NOTE — Progress Notes (Signed)
  Echocardiogram 2D Echocardiogram with definity has been performed.  Gregory Lane 02/17/2017, 9:25 AM

## 2017-02-17 NOTE — Progress Notes (Addendum)
Subjective:  Feels better No chest pain Breathing improved  Rate controlled Aflutter. Lactic acid improving  Objective:  Vital Signs in the last 24 hours: Temp:  [97.6 F (36.4 C)-98.7 F (37.1 C)] 98.3 F (36.8 C) (12/29 0800) Pulse Rate:  [72-128] 76 (12/29 0557) Resp:  [12-27] 20 (12/29 0557) BP: (73-157)/(57-87) 157/75 (12/29 0800) SpO2:  [86 %-100 %] 99 % (12/29 0806) Weight:  [115.7 kg (255 lb)-125.5 kg (276 lb 11.2 oz)] 123.2 kg (271 lb 9.6 oz) (12/29 0557)  Intake/Output from previous day: 12/28 0701 - 12/29 0700 In: 895.3 [P.O.:840; I.V.:49.3] Out: 850 [Urine:850] Intake/Output from this shift: No intake/output data recorded.  Physical Exam: note and vitals reviewed. Constitutional: He is oriented to person, place, and time. He appears well-developed.  Moderately obese  HENT:  Head: Normocephalic and atraumatic.  Eyes: Pupils are equal, round, and reactive to light.  Neck: Normal range of motion. Neck supple. No JVD present.  Cardiovascular: Normal rate.  No murmur heard. Rate controlled Aflutter Diminished distal pulses. Stasis dermatitis bilateral feet.   Respiratory: Effort normal. Bilateral diminished air entry. No wheezing GI: Soft. Bowel sounds are normal. There is no tenderness. There is no rebound.  Lymphadenopathy:    He has no cervical adenopathy.  Neurological: He is alert and oriented to person, place, and time. He has normal reflexes.  Blind in left eye, diminished vision in right eye. Otherwise no cranial nerve deficits.  Skin: Skin is warm and dry.  Psychiatric: He has a normal mood and affect.      Lab Results: Recent Labs    02/16/17 1317 02/17/17 0600  WBC 10.9* 8.0  HGB 16.7 15.4  PLT 214 201   Recent Labs    02/16/17 1317 02/17/17 0600  NA 138 135  K 4.2 4.4  CL 106 103  CO2 23 21*  GLUCOSE 137* 207*  BUN 17 12  CREATININE 1.32* 0.83   Recent Labs    02/17/17 0039 02/17/17 0600  TROPONINI <0.03 <0.03   Hepatic  Function Panel Recent Labs    02/16/17 1317  PROT 6.8  ALBUMIN 3.7  AST 21  ALT 36  ALKPHOS 71  BILITOT 0.5    Imaging: CTA chest 02/16/2017: IMPRESSION: 1. No CT findings for pulmonary embolism. 2. Moderate scattered atherosclerotic calcifications involving the thoracic aorta but no aneurysm or dissection. 3. Scattered three-vessel coronary artery calcifications. 4. No acute pulmonary findings.   EXAM: PORTABLE CHEST 1 VIEW  COMPARISON:  CTA chest 05/24/2015 and earlier.  FINDINGS: Portable AP semi upright view at 1318 hours. Stable cardiac size at the upper limits of normal. Other mediastinal contours are within normal limits. Visualized tracheal air column is within normal limits. Stable lung volumes. Allowing for portable technique the lungs are clear. No pneumothorax or pleural effusion.  IMPRESSION: No acute cardiopulmonary abnormality.  Cardiac Studies: Echocardiogram 02/17/2017 Study Conclusions  - Left ventricle: The cavity size was normal. The estimated   ejection fraction was in the range of 55% to 60%. Wall motion was   normal; there were no regional wall motion abnormalities. Left   ventricular diastolic function parameters were normal. - No significant valvular abnormality. - Inadequate TR jet to estimate PASP.  Assessment: New onset typical atrial flutter with controlled ventricular rate. CHA2DS2VASc score 4, 4.8% annual stroke risk Lactic acid elevation: Now improving. Chest pain: Likely supply demand mismatch in the setting of tachycardia. Negative troponin, no significant ischemic changes on EKG. Now resolved COPD exacerbation: Mild Tobacco abuse History  of stroke  Recommendation: Continue rate control with metoprolol 25 mg bid and diltiazem CD 120 mg. Will see him outpatient. Typical atrial flutter, will benefit from elective flutter ablation. His risk factors for atrial flutter include obesity, COPD exacerbation hypertension, which  need to be controlled. Recommend Eliquis 5 mg twice daily for anticoagulation. I discussed risks/benefits of anticoagulation with the patient and his family.  Continue baseline management including aspirin, Lipitor, Imdur, lisinopril, amlodipine.   Will arrange outpatient follow up.Sister, Golden Pop [phone] From Cardiology standpoint, can be discharged today.    LOS: 1 day    Tinnie Kunin J Zamia Tyminski 02/17/2017, 10:35 AM  [place], MD [facility] [place] Pager: [phone] Office: [phone] If no answer Cell [phone]

## 2017-02-17 NOTE — Progress Notes (Signed)
AM lactic acid resulted at 3.5, [facility] paged with this information.

## 2017-02-17 NOTE — Progress Notes (Signed)
Last lactic acid at 1636 resulted at 2.67, no scheduled lactic acid lab noted this morning.  RN text paged [facility] with this information.

## 2017-02-17 NOTE — Discharge Summary (Signed)
Physician Discharge Summary  Gregory Lane:[id number] DOB: 1951/02/17 DOA: 02/16/2017  PCP: [name], MD  Admit date: 02/16/2017 Discharge date: 02/17/2017  Recommendations for Outpatient Follow-up:  1. Atrial flutter with RVR, new dx. Started on diltiazem and apixaban 2. Depressed TSH. Significance unclear. No signs of hyperthyroidism. T3, free T4 pending on discharge. Consider further evaluation as an outpatient. 3. Aortic atherosclerosis    Discharge Diagnoses:  1. Atrial flutter with RVR 2. COPD exacerbation 3. Depressed TSH 4. AKI 5. Aortic atherosclerosis  Discharge Condition: improved Disposition: home  Diet recommendation: regular  Filed Weights   02/16/17 1305 02/16/17 1800 02/17/17 0557  Weight: 115.7 kg (255 lb) 125.5 kg (276 lb 11.2 oz) 123.2 kg (271 lb 9.6 oz)    History of present illness:  66yom PMH COPD, ongoing smoker, stroke, MI, presented with sudden onset SOB, chest pain. Found to have new aflutter/RVR, elevated lactic acid, COPD exacerbation.  Hospital Course:  Condition improved more rapidly than expected; surprising resolution of COPD exacerbation with steroids and bronchodilators. HR controlled without difficulty with oral diltiazem, again suprising, especially given COPD. Started on apixaban by cardiology and cleared for discharge. Note is made of elevated lactic acid which is trending down. There are no s/s or infection, no fever; normal hemodynamics and WBC. Likely elevated from rapid HR and AKI.  Atrial flutter with RVR, new dx with associated CP. No evidence of ACS. - echo unremarkable - TSH depressed but not clearly significant - home on metoprolol 25 BUD,  diltiazem 120 daily, apixaban 5 mg BID with outpt f/u with cardiology  COPD exacerbation - resolved, will go home on prednisone taper  Depressed TSH - plan as above, T3 and free T4 pending; f/u as an outpatient.  Elevated lactic acid - completely asymptomatic;  afebrile, VSS, no abd pain and exam benign, WBC WNL - no reason to suspect infection or severe illness - no further evaluation suggested  Aortic atherosclerosis - consider statin  Consults Cardiology  Procedures  Echo Study Conclusions  - Left ventricle: The cavity size was normal. The estimated   ejection fraction was in the range of 55% to 60%. Wall motion was   normal; there were no regional wall motion abnormalities. Left   ventricular diastolic function parameters were normal. - No significant valvular abnormality. - Inadequate TR jet to estimate PASP.   Today's assessment: S: feels good, breathing back to normal, no CP. No abd pain/n/v. O: Vitals:  Vitals:   02/17/17 0800 02/17/17 0806  BP: (!) 157/75   Pulse:    Resp:    Temp: 98.3 F (36.8 C)   SpO2: 95% 99%    Constitutional:   . Appears calm and comfortable. Appears very well. ENMT:  . grossly normal hearing  Respiratory:  . CTA bilaterally, no w/r/r.  . Respiratory effort normal.  Cardiovascular:  . RRR, no m/r/g . No LE extremity edema   Telemetry aflutter Abdomen:  . Soft, ntnd Skin:  . No rashes, lesions, ulcers noted Psychiatric:  . judgement and insight appear normal . Mental status o Mood, affect appropriate  Creatinine 1.32 >> 0.83 BMP unremarkable Lactic acid 2.67 >. 3.5 >> 3.1 Hgb 15.4, WBC 8.0 TSH 0.260 Echo noted  Discharge Instructions  Discharge Instructions    Activity as tolerated - No restrictions   Complete by:  As directed    Diet - low sodium heart healthy   Complete by:  As directed    Discharge instructions   Complete by:  As directed    Call your physician or seek immediate medical attention for chest pain, heart racing, lightheadness, dizziness, passing out, weakness, shortness of breath, bleeding or worsening of condition.     Allergies as of 02/17/2017   No Known Allergies     Medication List    STOP taking these medications   amLODipine 10 MG  tablet Commonly known as:  NORVASC     TAKE these medications   albuterol 108 (90 Base) MCG/ACT inhaler Commonly known as:  PROVENTIL HFA;VENTOLIN HFA Inhale 2 puffs into the lungs every 6 (six) hours as needed for wheezing or shortness of breath.   apixaban 5 MG Tabs tablet Commonly known as:  ELIQUIS Take 1 tablet (5 mg total) by mouth 2 (two) times daily.   aspirin EC 81 MG tablet Take 81 mg by mouth daily.   atorvastatin 20 MG tablet Commonly known as:  LIPITOR Take 20 mg by mouth daily.   buPROPion 150 MG 24 hr tablet Commonly known as:  WELLBUTRIN XL Take 150 mg by mouth daily.   diltiazem 120 MG 24 hr capsule Commonly known as:  CARDIZEM CD Take 1 capsule (120 mg total) by mouth daily. Start taking on:  02/18/2017   FLUoxetine 20 MG capsule Commonly known as:  PROZAC Take 40 mg by mouth daily.   furosemide 20 MG tablet Commonly known as:  LASIX Take 20 mg by mouth daily.   isosorbide mononitrate 60 MG 24 hr tablet Commonly known as:  IMDUR Take 60 mg by mouth daily.   lisinopril 20 MG tablet Commonly known as:  PRINIVIL,ZESTRIL Take 1 tablet (20 mg total) by mouth daily.   metoprolol tartrate 25 MG tablet Commonly known as:  LOPRESSOR Take 0.5 tablets (12.5 mg total) by mouth 2 (two) times daily. What changed:  how much to take   pantoprazole 40 MG tablet Commonly known as:  PROTONIX Take 1 tablet (40 mg total) by mouth 2 (two) times daily.   potassium chloride SA 20 MEQ tablet Commonly known as:  K-DUR,KLOR-CON Take 1 tablet (20 mEq total) by mouth 2 (two) times daily.   predniSONE 20 MG tablet Commonly known as:  DELTASONE Take 2 tablets (40 mg total) by mouth daily with breakfast. Start taking on:  02/18/2017      No Known Allergies  The results of significant diagnostics from this hospitalization (including imaging, microbiology, ancillary and laboratory) are listed below for reference.    Significant Diagnostic Studies: Ct Angio Chest  Pe W And/or Wo Contrast  Result Date: 02/16/2017 CLINICAL DATA:  Chest pain and shortness of breath since this morning. EXAM: CT ANGIOGRAPHY CHEST WITH CONTRAST TECHNIQUE: Multidetector CT imaging of the chest was performed using the standard protocol during bolus administration of intravenous contrast. Multiplanar CT image reconstructions and MIPs were obtained to evaluate the vascular anatomy. CONTRAST:  178mL ISOVUE-370 IOPAMIDOL (ISOVUE-370) INJECTION 76% COMPARISON:  Chest CT 05/24/2015 FINDINGS: Cardiovascular: The heart is normal in size. No pericardial effusion. The aorta is normal in caliber. Moderate scattered atherosclerotic calcifications. No obvious dissection. The pulmonary arterial tree is fairly well opacified. No filling defects to suggest pulmonary embolism. Mediastinum/Nodes: No mediastinal or hilar mass or adenopathy. The esophagus appears normal. Lungs/Pleura: No acute pulmonary findings. No worrisome pulmonary lesions. No pleural effusion or pulmonary edema. Upper Abdomen: No significant upper abdominal findings. Musculoskeletal: No chest wall mass, supraclavicular or axillary adenopathy. The bony thorax is intact. Review of the MIP images confirms the above findings. IMPRESSION: 1. No  CT findings for pulmonary embolism. 2. Moderate scattered atherosclerotic calcifications involving the thoracic aorta but no aneurysm or dissection. 3. Scattered three-vessel coronary artery calcifications. 4. No acute pulmonary findings. Aortic Atherosclerosis (ICD10-I70.0). Electronically Signed   By: Marijo Sanes M.D.   On: 02/16/2017 16:03   Dg Chest Portable 1 View  Result Date: 02/16/2017 CLINICAL DATA:  66 year old male with central chest pain since 0830 hours. Tachycardia. A flutter on presentation. EXAM: PORTABLE CHEST 1 VIEW COMPARISON:  CTA chest 05/24/2015 and earlier. FINDINGS: Portable AP semi upright view at 1318 hours. Stable cardiac size at the upper limits of normal. Other mediastinal  contours are within normal limits. Visualized tracheal air column is within normal limits. Stable lung volumes. Allowing for portable technique the lungs are clear. No pneumothorax or pleural effusion. IMPRESSION: No acute cardiopulmonary abnormality. Electronically Signed   By: Genevie Ann M.D.   On: 02/16/2017 13:39    Microbiology: Recent Results (from the past 240 hour(s))  MRSA PCR Screening     Status: None   Collection Time: 02/16/17  9:05 PM  Result Value Ref Range Status   MRSA by PCR NEGATIVE NEGATIVE Final    Comment:        The GeneXpert MRSA Assay (FDA approved for NASAL specimens only), is one component of a comprehensive MRSA colonization surveillance program. It is not intended to diagnose MRSA infection nor to guide or monitor treatment for MRSA infections.      Labs: Basic Metabolic Panel: Recent Labs  Lab 02/16/17 1317 02/17/17 0600  NA 138 135  K 4.2 4.4  CL 106 103  CO2 23 21*  GLUCOSE 137* 207*  BUN 17 12  CREATININE 1.32* 0.83  CALCIUM 8.7* 8.9  MG 1.9  --    Liver Function Tests: Recent Labs  Lab 02/16/17 1317  AST 21  ALT 36  ALKPHOS 71  BILITOT 0.5  PROT 6.8  ALBUMIN 3.7   Recent Labs  Lab 02/16/17 1317  LIPASE 19   CBC: Recent Labs  Lab 02/16/17 1317 02/17/17 0600  WBC 10.9* 8.0  NEUTROABS 7.8*  --   HGB 16.7 15.4  HCT 47.3 46.0  MCV 99.0 100.0  PLT 214 201   Cardiac Enzymes: Recent Labs  Lab 02/16/17 1855 02/17/17 0039 02/17/17 0600  TROPONINI <0.03 <0.03 <0.03    Recent Labs    02/16/17 1317  BNP 64.3   Lactic Acid, Venous    Component Value Date/Time   LATICACIDVEN 3.1 (HH) 02/17/2017 0735    Principal Problem:   Atrial flutter with rapid ventricular response (HCC) Active Problems:   Cigarette smoker   COPD with acute exacerbation (HCC)   Elevated lactic acid level   Aortic atherosclerosis ([place])   Time coordinating discharge: 35 minutes  Signed:  Murray Hodgkins, MD [facility] 02/17/2017, 11:09 AM

## 2017-02-17 NOTE — Discharge Instructions (Signed)

## 2017-02-23 DIAGNOSIS — J449 Chronic obstructive pulmonary disease, unspecified: Secondary | ICD-10-CM | POA: Diagnosis not present

## 2017-02-23 DIAGNOSIS — I10 Essential (primary) hypertension: Secondary | ICD-10-CM | POA: Diagnosis not present

## 2017-02-23 DIAGNOSIS — I4892 Unspecified atrial flutter: Secondary | ICD-10-CM | POA: Diagnosis not present

## 2017-02-23 DIAGNOSIS — E785 Hyperlipidemia, unspecified: Secondary | ICD-10-CM | POA: Diagnosis not present

## 2017-03-22 DIAGNOSIS — I209 Angina pectoris, unspecified: Secondary | ICD-10-CM | POA: Diagnosis not present

## 2017-03-22 DIAGNOSIS — R0609 Other forms of dyspnea: Secondary | ICD-10-CM | POA: Diagnosis not present

## 2017-03-22 DIAGNOSIS — Z8679 Personal history of other diseases of the circulatory system: Secondary | ICD-10-CM | POA: Diagnosis not present

## 2017-03-22 DIAGNOSIS — I10 Essential (primary) hypertension: Secondary | ICD-10-CM | POA: Diagnosis not present

## 2017-03-23 DIAGNOSIS — Z720 Tobacco use: Secondary | ICD-10-CM | POA: Diagnosis not present

## 2017-03-23 DIAGNOSIS — I4892 Unspecified atrial flutter: Secondary | ICD-10-CM | POA: Diagnosis not present

## 2017-03-23 DIAGNOSIS — J449 Chronic obstructive pulmonary disease, unspecified: Secondary | ICD-10-CM | POA: Diagnosis not present

## 2017-05-24 DIAGNOSIS — Z7689 Persons encountering health services in other specified circumstances: Secondary | ICD-10-CM | POA: Diagnosis not present

## 2017-05-24 DIAGNOSIS — J449 Chronic obstructive pulmonary disease, unspecified: Secondary | ICD-10-CM | POA: Diagnosis not present

## 2017-05-24 DIAGNOSIS — Z0000 Encounter for general adult medical examination without abnormal findings: Secondary | ICD-10-CM | POA: Diagnosis not present

## 2017-05-24 DIAGNOSIS — Z0110 Encounter for examination of ears and hearing without abnormal findings: Secondary | ICD-10-CM | POA: Diagnosis not present

## 2017-05-24 DIAGNOSIS — Z0101 Encounter for examination of eyes and vision with abnormal findings: Secondary | ICD-10-CM | POA: Diagnosis not present

## 2017-05-24 DIAGNOSIS — Z125 Encounter for screening for malignant neoplasm of prostate: Secondary | ICD-10-CM | POA: Diagnosis not present

## 2017-05-24 DIAGNOSIS — I4892 Unspecified atrial flutter: Secondary | ICD-10-CM | POA: Diagnosis not present

## 2017-05-25 ENCOUNTER — Encounter: Payer: Self-pay | Admitting: Neurology

## 2017-05-30 DIAGNOSIS — I10 Essential (primary) hypertension: Secondary | ICD-10-CM | POA: Diagnosis not present

## 2017-05-30 DIAGNOSIS — J449 Chronic obstructive pulmonary disease, unspecified: Secondary | ICD-10-CM | POA: Diagnosis not present

## 2017-06-28 DIAGNOSIS — H2511 Age-related nuclear cataract, right eye: Secondary | ICD-10-CM | POA: Diagnosis not present

## 2017-06-28 DIAGNOSIS — H538 Other visual disturbances: Secondary | ICD-10-CM | POA: Diagnosis not present

## 2017-07-18 DIAGNOSIS — I10 Essential (primary) hypertension: Secondary | ICD-10-CM | POA: Diagnosis not present

## 2017-07-24 DIAGNOSIS — H4423 Degenerative myopia, bilateral: Secondary | ICD-10-CM | POA: Diagnosis not present

## 2017-07-24 DIAGNOSIS — H25812 Combined forms of age-related cataract, left eye: Secondary | ICD-10-CM | POA: Diagnosis not present

## 2017-07-24 DIAGNOSIS — H25811 Combined forms of age-related cataract, right eye: Secondary | ICD-10-CM | POA: Diagnosis not present

## 2017-08-06 DIAGNOSIS — H2522 Age-related cataract, morgagnian type, left eye: Secondary | ICD-10-CM | POA: Diagnosis not present

## 2017-08-06 DIAGNOSIS — H25811 Combined forms of age-related cataract, right eye: Secondary | ICD-10-CM | POA: Diagnosis not present

## 2017-08-17 ENCOUNTER — Other Ambulatory Visit: Payer: Self-pay

## 2017-08-17 ENCOUNTER — Encounter: Payer: Self-pay | Admitting: Neurology

## 2017-08-17 ENCOUNTER — Ambulatory Visit (INDEPENDENT_AMBULATORY_CARE_PROVIDER_SITE_OTHER): Payer: Medicare HMO | Admitting: Neurology

## 2017-08-17 VITALS — BP 180/84 | HR 80 | Ht 73.0 in | Wt 272.0 lb

## 2017-08-17 DIAGNOSIS — G629 Polyneuropathy, unspecified: Secondary | ICD-10-CM

## 2017-08-17 DIAGNOSIS — R42 Dizziness and giddiness: Secondary | ICD-10-CM | POA: Diagnosis not present

## 2017-08-17 NOTE — Patient Instructions (Signed)
1. You have a condition called Neuropathy, which affect the nerves in your feet, causing the dizziness/unsteadiness. Balance Therapy can be helpful for your symptoms  2. The spinning sensation is called Vertigo, it is due to dislodgement of tiny crystals in the inner ear, a treatment called Vestibular Therapy can help put the crystals back in place and make you feel better  3. Follow-up in 6 months, call for any changes

## 2017-08-17 NOTE — Progress Notes (Signed)
NEUROLOGY CONSULTATION NOTE  Gregory Lane MRN: [id number] DOB: [birth year]  Referring provider: Dr. [name] Primary care provider: Raelyn Number, PA  Reason for consult:  presyncope  Dear Dr Vista Lawman:  Thank you for your kind referral of Gregory Lane for consultation of the above symptoms. Although his history is well known to you, please allow me to reiterate it for the purpose of our medical record. He is alone in the office today. Records and images were personally reviewed where available.  HISTORY OF PRESENT ILLNESS: This is a 66 year old right-handed man with a history of hypertension, hyperlipidemia, atrial flutter on Eliquis, presenting for evaluation of dizziness. He started noticing symptoms around 6-8 months ago but they have been getting worse. He would be walking then start getting dizzy like he is drunk. Symptoms resolve when he sits. He notices the symptoms when he gets up too quickly or when getting up in the morning, he stands up and has to sit back down. There is no spinning sensation when standing, he mostly feels unsteady. He does note spinning sensations when lying down. He sometimes gets so dizzy he feels like he would pass out. There is no associated nausea/vomiting, diplopia, dysarthria/dysphagia, focal weakness. He has constant tingling in his left hand and had ulnar nerve surgery years ago. He has occasional numbness and tingling in both feet for the past few months. He has severe arthritis in his right knee and reports poor circulation and arthritis in his left leg. He has decreased vision in the left eye. He reports falls from his knee issues and unsteadiness, occasionally uses his cane.   Laboratory Data:   PAST MEDICAL HISTORY: Past Medical History:  Diagnosis Date  . Aortic atherosclerosis ([place]) 02/16/2017  . Arthritis   . Blind left eye   . Hypertension   . Low TSH level 02/17/2017  . Myocardial infarction ([place])   . Perforated  duodenal ulcer ([place])   . Stroke [facility])     PAST SURGICAL HISTORY: Past Surgical History:  Procedure Laterality Date  . EXPLORATORY LAPAROTOMY  05/12/2015  . EYE SURGERY    . FRACTURE SURGERY    . LAPAROTOMY N/A 05/10/2015   Procedure: EXPLORATORY LAPAROTOMY;  Surgeon: Ralene Ok, MD;  Location: [place];  Service: General;  Laterality: N/A;  . LEFT HEART CATH AND CORONARY ANGIOGRAPHY N/A 05/02/2016   Procedure: Left Heart Cath and Coronary Angiography;  Surgeon: Adrian Prows, MD;  Location: [place] [facility];  Service: Cardiovascular;  Laterality: N/A;    MEDICATIONS: Current Outpatient Medications on File Prior to Visit  Medication Sig Dispense Refill  . albuterol (PROVENTIL HFA;VENTOLIN HFA) 108 (90 Base) MCG/ACT inhaler Inhale 2 puffs into the lungs every 6 (six) hours as needed for wheezing or shortness of breath. 1 Inhaler 1  . apixaban (ELIQUIS) 5 MG TABS tablet Take 1 tablet (5 mg total) by mouth 2 (two) times daily. 60 tablet 0  . aspirin EC 81 MG tablet Take 81 mg by mouth daily.    Marland Kitchen atorvastatin (LIPITOR) 20 MG tablet Take 20 mg by mouth daily.    Marland Kitchen buPROPion (WELLBUTRIN XL) 150 MG 24 hr tablet Take 150 mg by mouth daily.    Marland Kitchen diltiazem (CARDIZEM CD) 120 MG 24 hr capsule Take 1 capsule (120 mg total) by mouth daily. 30 capsule 0  . FLUoxetine (PROZAC) 20 MG capsule Take 40 mg by mouth daily.    . furosemide (LASIX) 20 MG tablet Take 20 mg by  mouth daily.    . isosorbide mononitrate (IMDUR) 60 MG 24 hr tablet Take 60 mg by mouth daily.    Marland Kitchen lisinopril (PRINIVIL,ZESTRIL) 20 MG tablet Take 1 tablet (20 mg total) by mouth daily. 30 tablet 0  . metoprolol tartrate (LOPRESSOR) 25 MG tablet Take 0.5 tablets (12.5 mg total) by mouth 2 (two) times daily. (Patient taking differently: Take 25 mg by mouth 2 (two) times daily. ) 15 tablet 0  . pantoprazole (PROTONIX) 40 MG tablet Take 1 tablet (40 mg total) by mouth 2 (two) times daily. 60 tablet 2  . potassium chloride SA (K-DUR,KLOR-CON)  20 MEQ tablet Take 1 tablet (20 mEq total) by mouth 2 (two) times daily. 8 tablet 0  . predniSONE (DELTASONE) 20 MG tablet Take 2 tablets (40 mg total) by mouth daily with breakfast. 8 tablet 0   No current facility-administered medications on file prior to visit.     ALLERGIES: No Known Allergies  FAMILY HISTORY: Family History  Problem Relation Age of Onset  . Cirrhosis Father   . Hypertension Mother   . Lung cancer Sister   . Hypertension Sister   . Arthritis Sister   . Heart murmur Sister     SOCIAL HISTORY: Social History   Socioeconomic History  . Marital status: Single    Spouse name: Not on file  . Number of children: Not on file  . Years of education: Not on file  . Highest education level: Not on file  Occupational History  . Not on file  Social Needs  . Financial resource strain: Not on file  . Food insecurity:    Worry: Not on file    Inability: Not on file  . Transportation needs:    Medical: Not on file    Non-medical: Not on file  Tobacco Use  . Smoking status: Current Every Day Smoker    Packs/day: 1.00    Years: 55.00    Pack years: 55.00    Types: Cigarettes  . Smokeless tobacco: Never Used  Substance and Sexual Activity  . Alcohol use: No    Alcohol/week: 36.0 oz    Types: 60 Standard drinks or equivalent per week    Frequency: Never    Comment: daily 12 pk of beer  . Drug use: No  . Sexual activity: Not on file  Lifestyle  . Physical activity:    Days per week: Not on file    Minutes per session: Not on file  . Stress: Not on file  Relationships  . Social connections:    Talks on phone: Not on file    Gets together: Not on file    Attends religious service: Not on file    Active member of club or organization: Not on file    Attends meetings of clubs or organizations: Not on file    Relationship status: Not on file  . Intimate partner violence:    Fear of current or ex partner: Not on file    Emotionally abused: Not on file     Physically abused: Not on file    Forced sexual activity: Not on file  Other Topics Concern  . 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 Lives alone    REVIEW OF SYSTEMS: Constitutional: No fevers, chills, or sweats, no generalized fatigue, change in appetite Eyes: No visual changes, double vision, eye pain Ear, nose and throat: No hearing loss, ear pain, nasal congestion, sore throat Cardiovascular: No chest pain,  palpitations Respiratory:  No shortness of breath at rest or with exertion, wheezes GastrointestinaI: No nausea, vomiting, diarrhea, abdominal pain, fecal incontinence Genitourinary:  No dysuria, urinary retention or frequency Musculoskeletal:  No neck pain, back pain Integumentary: No rash, pruritus, skin lesions Neurological: as above Psychiatric: No depression, insomnia, anxiety Endocrine: No palpitations, fatigue, diaphoresis, mood swings, change in appetite, change in weight, increased thirst Hematologic/Lymphatic:  No anemia, purpura, petechiae. Allergic/Immunologic: no itchy/runny eyes, nasal congestion, recent allergic reactions, rashes  PHYSICAL EXAM: Vitals:   08/17/17 1435  BP: (!) 180/84  Pulse: 80  SpO2: 94%   General: No acute distress Head:  Normocephalic/atraumatic Eyes: Fundoscopic exam shows bilateral sharp discs, no vessel changes, exudates, or hemorrhages Neck: supple, no paraspinal tenderness, full range of motion Back: No paraspinal tenderness Heart: regular rate and rhythm Lungs: Clear to auscultation bilaterally. Vascular: No carotid bruits. Skin/Extremities: +scaly patches on his ankles and tops of feet bilaterally Neurological Exam: Mental status: alert and oriented to person, place, and time, no dysarthria or aphasia, Fund of knowledge is appropriate.  Recent and remote memory are intact.  Attention and concentration are normal.    Able to name objects and repeat phrases. Cranial nerves: CN I: not tested CN II: pupils  equal, round and reactive to light, visual fields intact, fundi unremarkable. CN III, IV, VI:  full range of motion, no nystagmus, no ptosis CN V: facial sensation intact CN VII: upper and lower face symmetric CN VIII: hearing intact to finger rub CN IX, X: gag intact, uvula midline CN XI: sternocleidomastoid and trapezius muscles intact CN XII: tongue midline Bulk & Tone: normal, no fasciculations. Motor: 5/5 throughout with no pronator drift. Sensation: decreased cold and pin on left UE, decreased cold and pin to ankles, decreased vibration to knees. Romberg test positive sway Deep Tendon Reflexes: +2 on both UE, unable to elicit on both LE, no ankle clonus Plantar responses: downgoing bilaterally Cerebellar: no incoordination on finger to nose testing Gait: wide-based, slow and cautious with left knee pain, unable to tandem walk Tremor: none  IMPRESSION: This is a 67 year old right-handed man with a history of hypertension, hyperlipidemia, atrial flutter on Eliquis, presenting for evaluation of dizziness. He describes both unsteadiness with walking, as well as spinning sensation when supine. Neurological exam shows evidence of a length-dependent neuropathy, which can cause the gait unsteadiness. He will be referred for balance therapy. We also discussed positional vertigo, he will also be referred for vestibular therapy. He will follow-up in 6 months and knows to call for any changes.   Thank you for allowing me to participate in the care of this patient. Please do not hesitate to call for any questions or concerns.   Ellouise Newer, M.D.  CC: Dr. Vista Lawman, Raelyn Number, [place]

## 2017-08-22 DIAGNOSIS — H2512 Age-related nuclear cataract, left eye: Secondary | ICD-10-CM | POA: Diagnosis not present

## 2017-08-22 DIAGNOSIS — H2181 Floppy iris syndrome: Secondary | ICD-10-CM | POA: Diagnosis not present

## 2017-08-22 DIAGNOSIS — H2589 Other age-related cataract: Secondary | ICD-10-CM | POA: Diagnosis not present

## 2017-08-22 DIAGNOSIS — H25812 Combined forms of age-related cataract, left eye: Secondary | ICD-10-CM | POA: Diagnosis not present

## 2017-08-24 ENCOUNTER — Encounter: Payer: Self-pay | Admitting: Neurology

## 2017-09-21 DIAGNOSIS — H2511 Age-related nuclear cataract, right eye: Secondary | ICD-10-CM | POA: Diagnosis not present

## 2017-09-26 DIAGNOSIS — H2511 Age-related nuclear cataract, right eye: Secondary | ICD-10-CM | POA: Diagnosis not present

## 2017-09-26 DIAGNOSIS — H25811 Combined forms of age-related cataract, right eye: Secondary | ICD-10-CM | POA: Diagnosis not present

## 2017-10-24 DIAGNOSIS — I252 Old myocardial infarction: Secondary | ICD-10-CM | POA: Diagnosis not present

## 2017-10-24 DIAGNOSIS — R69 Illness, unspecified: Secondary | ICD-10-CM | POA: Diagnosis not present

## 2017-10-24 DIAGNOSIS — K219 Gastro-esophageal reflux disease without esophagitis: Secondary | ICD-10-CM | POA: Diagnosis not present

## 2017-10-24 DIAGNOSIS — G3184 Mild cognitive impairment, so stated: Secondary | ICD-10-CM | POA: Diagnosis not present

## 2017-10-24 DIAGNOSIS — J449 Chronic obstructive pulmonary disease, unspecified: Secondary | ICD-10-CM | POA: Diagnosis not present

## 2017-10-24 DIAGNOSIS — I509 Heart failure, unspecified: Secondary | ICD-10-CM | POA: Diagnosis not present

## 2017-10-24 DIAGNOSIS — G8929 Other chronic pain: Secondary | ICD-10-CM | POA: Diagnosis not present

## 2017-10-24 DIAGNOSIS — I25119 Atherosclerotic heart disease of native coronary artery with unspecified angina pectoris: Secondary | ICD-10-CM | POA: Diagnosis not present

## 2017-10-24 DIAGNOSIS — I110 Hypertensive heart disease with heart failure: Secondary | ICD-10-CM | POA: Diagnosis not present

## 2018-03-05 DIAGNOSIS — I4892 Unspecified atrial flutter: Secondary | ICD-10-CM | POA: Diagnosis not present

## 2018-03-05 DIAGNOSIS — Z720 Tobacco use: Secondary | ICD-10-CM | POA: Diagnosis not present

## 2018-03-05 DIAGNOSIS — E669 Obesity, unspecified: Secondary | ICD-10-CM | POA: Diagnosis not present

## 2018-03-05 DIAGNOSIS — Z131 Encounter for screening for diabetes mellitus: Secondary | ICD-10-CM | POA: Diagnosis not present

## 2018-03-05 DIAGNOSIS — R69 Illness, unspecified: Secondary | ICD-10-CM | POA: Diagnosis not present

## 2018-03-05 DIAGNOSIS — I10 Essential (primary) hypertension: Secondary | ICD-10-CM | POA: Diagnosis not present

## 2018-03-28 ENCOUNTER — Ambulatory Visit: Payer: Medicare HMO | Admitting: Neurology

## 2018-04-16 DIAGNOSIS — I10 Essential (primary) hypertension: Secondary | ICD-10-CM | POA: Diagnosis not present

## 2018-04-16 DIAGNOSIS — R69 Illness, unspecified: Secondary | ICD-10-CM | POA: Diagnosis not present

## 2018-04-16 DIAGNOSIS — I4892 Unspecified atrial flutter: Secondary | ICD-10-CM | POA: Diagnosis not present

## 2018-04-16 DIAGNOSIS — Z720 Tobacco use: Secondary | ICD-10-CM | POA: Diagnosis not present

## 2018-04-16 DIAGNOSIS — E669 Obesity, unspecified: Secondary | ICD-10-CM | POA: Diagnosis not present

## 2018-04-16 DIAGNOSIS — Z131 Encounter for screening for diabetes mellitus: Secondary | ICD-10-CM | POA: Diagnosis not present

## 2018-04-16 DIAGNOSIS — J449 Chronic obstructive pulmonary disease, unspecified: Secondary | ICD-10-CM | POA: Diagnosis not present

## 2019-09-29 ENCOUNTER — Other Ambulatory Visit: Payer: Self-pay

## 2019-09-29 ENCOUNTER — Ambulatory Visit (INDEPENDENT_AMBULATORY_CARE_PROVIDER_SITE_OTHER): Payer: Medicare HMO | Admitting: Internal Medicine

## 2019-09-29 ENCOUNTER — Encounter: Payer: Self-pay | Admitting: Internal Medicine

## 2019-09-29 ENCOUNTER — Other Ambulatory Visit: Payer: Self-pay | Admitting: Internal Medicine

## 2019-09-29 VITALS — BP 160/76 | HR 60 | Ht 73.0 in | Wt 234.2 lb

## 2019-09-29 DIAGNOSIS — Z720 Tobacco use: Secondary | ICD-10-CM | POA: Diagnosis not present

## 2019-09-29 DIAGNOSIS — I4891 Unspecified atrial fibrillation: Secondary | ICD-10-CM | POA: Diagnosis not present

## 2019-09-29 DIAGNOSIS — R7989 Other specified abnormal findings of blood chemistry: Secondary | ICD-10-CM | POA: Diagnosis not present

## 2019-09-29 DIAGNOSIS — J441 Chronic obstructive pulmonary disease with (acute) exacerbation: Secondary | ICD-10-CM | POA: Diagnosis not present

## 2019-09-29 DIAGNOSIS — I700 Atherosclerosis of aorta: Secondary | ICD-10-CM

## 2019-09-29 DIAGNOSIS — R0602 Shortness of breath: Secondary | ICD-10-CM | POA: Diagnosis not present

## 2019-09-29 DIAGNOSIS — I10 Essential (primary) hypertension: Secondary | ICD-10-CM

## 2019-09-29 MED ORDER — BUDESONIDE-FORMOTEROL FUMARATE 160-4.5 MCG/ACT IN AERO: INHALATION_SPRAY | RESPIRATORY_TRACT | 5 refills | Status: DC

## 2019-09-29 MED ORDER — APIXABAN 5 MG PO TABS: 5.0000 mg | ORAL_TABLET | Freq: Two times a day (BID) | ORAL | 0 refills | Status: DC

## 2019-09-29 MED ORDER — BUPROPION HCL ER (XL) 150 MG PO TB24: 300.0000 mg | ORAL_TABLET | Freq: Every day | ORAL | 1 refills | Status: DC

## 2019-09-29 MED ORDER — FUROSEMIDE 20 MG PO TABS: 20.0000 mg | ORAL_TABLET | Freq: Every day | ORAL | 3 refills | Status: DC

## 2019-09-29 MED ORDER — POTASSIUM CHLORIDE CRYS ER 20 MEQ PO TBCR: 20.0000 meq | EXTENDED_RELEASE_TABLET | Freq: Two times a day (BID) | ORAL | 0 refills | Status: DC

## 2019-09-29 NOTE — Assessment & Plan Note (Signed)
-   I instructed the patient to stop smoking and provided them with smoking cessation materials.  - I informed the patient that smoking puts them at increased risk for cancer, COPD, hypertension, and more.  - Informed the patient to seek help if they begin to have trouble breathing, develop chest pain, start to cough up blood, feel faint, or pass out.  

## 2019-09-29 NOTE — Assessment & Plan Note (Signed)
Pt complained of SOB on exertion. He has COPD due to smoking; he was advised to quit smoking completely. We'll start him on albuterol inhalation therapy. He was also advised to lose weight. We'll get an echocardiogram to evaluate LV function.

## 2019-09-29 NOTE — Assessment & Plan Note (Signed)
Pt was advised to quit smoking. He was also advised to take COVID19 and pneumonia vaccine. We'll start him on inhalebroncodialater therapy with albuterol

## 2019-09-29 NOTE — Assessment & Plan Note (Signed)
Stable on medication

## 2019-09-29 NOTE — Assessment & Plan Note (Signed)
Pt is on statin for that. He was also advised to quit smoking and lose weight as well as walk daily.

## 2019-09-29 NOTE — Progress Notes (Signed)
New Patient Office Visit  Subjective:  Subjective  Patient ID: Gregory Lane, male    DOB: 04-Feb-1951  Age: 69 y.o. MRN: [id number]  CC:  Chief Complaint  Patient presents with  . Establish Care    HPI Gregory Lane is a 69 y.o. male presenting today for a new patient evaluation to establish care. He moved here from [place], but has had difficulties getting into the clinic. His main issue today is his shortness of breath and his knee pain.   He has a history of COPD, which is worse in the summer with humidity and worse with exacerbation. He notes that he likes to spend time outside ut the humidity makes it nearly impossible for him and he gets short of breath just sitting outside when it's hot and humid. He also gets short of breath easily when walking. He would like to see about getting a portable oxygen to help him when he has to go out.   He has severe arthritis in left leg and he has to wear and knee brace all the time. He had a falling incident in his bathroom; he denies having hit his head or having any abnormal lasting pain. He would like to get a handicap placard for his vehicle due to how much it hurts to walk, especially long distances.   He has had three heart attacks. He states that he has not had to have a stent yet. He also has a history of a stroke on his left side.   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 He was prescribed medication to treat this.   He is currently out of all his prescribed medications due to not being established with a local primary care physician.   He is not currently vaccinated against COVID-19. He said that he intends to get one this week. He is not vaccinated against shingles or pneumonia.    Past Medical History:  Diagnosis Date  . Aortic atherosclerosis ([place]) 02/16/2017  . Arthritis   . Blind left eye   . Hypertension   . Low TSH level 02/17/2017  . Myocardial infarction ([place])    . Perforated duodenal ulcer ([place])   . Stroke [facility])     Past Surgical History:  Procedure Laterality Date  . EXPLORATORY LAPAROTOMY  05/12/2015  . EYE SURGERY    . FRACTURE SURGERY    . LAPAROTOMY N/A 05/10/2015   Procedure: EXPLORATORY LAPAROTOMY;  Surgeon: Ralene Ok, MD;  Location: [place];  Service: General;  Laterality: N/A;  . LEFT HEART CATH AND CORONARY ANGIOGRAPHY N/A 05/02/2016   Procedure: Left Heart Cath and Coronary Angiography;  Surgeon: Adrian Prows, MD;  Location: [place] [facility];  Service: Cardiovascular;  Laterality: N/A;    Family History  Problem Relation Age of Onset  . Cirrhosis Father   . Hypertension Mother   . Lung cancer Sister   . Hypertension Sister   . Arthritis Sister   . Heart murmur Sister     Social History   Socioeconomic History  . Marital status: Single    Spouse name: Not on file  . Number of children: Not on file  . Years of education: Not on file  . Highest education level: Not on file  Occupational History  . Not on file  Tobacco Use  . Smoking status: Current Every Day Smoker    Packs/day: 1.00    Years: 60.00  Pack years: 60.00    Types: Cigarettes  . Smokeless tobacco: Never Used  Substance and Sexual Activity  . Alcohol use: No    Alcohol/week: 60.0 standard drinks    Types: 60 Standard drinks or equivalent per week    Comment: daily 12 pk of beer  . Drug use: No  . Sexual activity: Not on file  Other Topics Concern  . 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 Lives alone   Social Determinants of Health   Financial Resource Strain:   . Difficulty of Paying Living Expenses:   Food Insecurity:   . Worried About Charity fundraiser in the Last Year:   . Arboriculturist in the Last Year:   Transportation Needs:   . Film/video editor (Medical):   Marland Kitchen Lack of Transportation (Non-Medical):   Physical Activity:   . Days of Exercise per Week:   . Minutes of Exercise per Session:   Stress:   .  Feeling of Stress :   Social Connections:   . Frequency of Communication with Friends and Family:   . Frequency of Social Gatherings with Friends and Family:   . Attends Religious Services:   . Active Member of Clubs or Organizations:   . Attends Archivist Meetings:   Marland Kitchen Marital Status:   Intimate Partner Violence:   . Fear of Current or Ex-Partner:   . Emotionally Abused:   Marland Kitchen Physically Abused:   . Sexually Abused:      Current Outpatient Medications:  .  albuterol (PROVENTIL HFA;VENTOLIN HFA) 108 (90 Base) MCG/ACT inhaler, Inhale 2 puffs into the lungs every 6 (six) hours as needed for wheezing or shortness of breath., Disp: 1 Inhaler, Rfl: 1 .  amLODipine (NORVASC) 10 MG tablet, Take 10 mg by mouth daily., Disp: , Rfl: 3 .  apixaban (ELIQUIS) 5 MG TABS tablet, Take 1 tablet (5 mg total) by mouth 2 (two) times daily., Disp: 60 tablet, Rfl: 0 .  aspirin EC 81 MG tablet, Take 81 mg by mouth daily., Disp: , Rfl:  .  atorvastatin (LIPITOR) 20 MG tablet, Take 20 mg by mouth daily., Disp: , Rfl:  .  buPROPion (WELLBUTRIN XL) 150 MG 24 hr tablet, Take 150 mg by mouth daily., Disp: , Rfl:  .  diltiazem (CARDIZEM CD) 120 MG 24 hr capsule, Take 1 capsule (120 mg total) by mouth daily., Disp: 30 capsule, Rfl: 0 .  FLUoxetine (PROZAC) 20 MG capsule, Take 40 mg by mouth daily., Disp: , Rfl:  .  furosemide (LASIX) 20 MG tablet, Take 20 mg by mouth daily., Disp: , Rfl:  .  isosorbide mononitrate (IMDUR) 60 MG 24 hr tablet, Take 60 mg by mouth daily., Disp: , Rfl:  .  lisinopril (PRINIVIL,ZESTRIL) 20 MG tablet, Take 1 tablet (20 mg total) by mouth daily., Disp: 30 tablet, Rfl: 0 .  metoprolol tartrate (LOPRESSOR) 25 MG tablet, Take 0.5 tablets (12.5 mg total) by mouth 2 (two) times daily. (Patient taking differently: Take 25 mg by mouth 2 (two) times daily. ), Disp: 15 tablet, Rfl: 0 .  pantoprazole (PROTONIX) 40 MG tablet, Take 1 tablet (40 mg total) by mouth 2 (two) times daily., Disp: 60  tablet, Rfl: 2 .  potassium chloride SA (K-DUR,KLOR-CON) 20 MEQ tablet, Take 1 tablet (20 mEq total) by mouth 2 (two) times daily., Disp: 8 tablet, Rfl: 0 .  predniSONE (DELTASONE) 20 MG tablet, Take 2 tablets (40 mg total) by  mouth daily with breakfast., Disp: 8 tablet, Rfl: 0   No Known Allergies  ROS Review of Systems  Constitutional: Negative.   HENT: Negative.   Eyes: Negative.   Respiratory: Positive for shortness of breath.   Gastrointestinal: Negative.   Endocrine: Negative.   Genitourinary: Negative.   Musculoskeletal: Negative.   Skin: Negative.   Allergic/Immunologic: Negative.   Neurological: Negative.   Hematological: Negative.   Psychiatric/Behavioral: Negative.   All other systems reviewed and are negative.     Objective:    Physical Exam Vitals reviewed.  Constitutional:      Appearance: Normal appearance.  HENT:     Mouth/Throat:     Mouth: Mucous membranes are moist.  Eyes:     Pupils: Pupils are equal, round, and reactive to light.  Neck:     Vascular: No carotid bruit.  Cardiovascular:     Rate and Rhythm: Normal rate and regular rhythm.     Pulses: Normal pulses.     Heart sounds: Normal heart sounds.  Pulmonary:     Effort: Pulmonary effort is normal.     Breath sounds: Normal breath sounds.  Abdominal:     General: Bowel sounds are normal.     Palpations: Abdomen is soft. There is no hepatomegaly, splenomegaly or mass.     Tenderness: There is no abdominal tenderness.     Hernia: No hernia is present.  Musculoskeletal:     Cervical back: Neck supple.     Left knee: Tenderness present.     Right lower leg: 1+ Edema present.     Left lower leg: 1+ Edema present.     Comments: Brace on L knee  Skin:    Findings: No rash.  Neurological:     Mental Status: He is alert and oriented to person, place, and time.     Motor: No weakness.  Psychiatric:        Mood and Affect: Mood normal.        Behavior: Behavior normal.     BP (!) 160/76    Pulse 60   Ht 6\' 1"  (1.854 m)   Wt 234 lb 3.2 oz (106.2 kg)   BMI 30.90 kg/m  Wt Readings from Last 3 Encounters:  09/29/19 234 lb 3.2 oz (106.2 kg)  08/17/17 272 lb (123.4 kg)  02/17/17 271 lb 9.6 oz (123.2 kg)    Health Maintenance Due  Topic Date Due  . Hepatitis C Screening  Never done  . COVID-19 Vaccine (1) Never done  . TETANUS/TDAP  Never done  . COLONOSCOPY  Never done  . PNA vac Low Risk Adult (2 of 2 - PCV13) 05/14/2016  . INFLUENZA VACCINE  09/21/2019    There are no preventive care reminders to display for this patient.  Laboratory Data: I have reviewed this information for accuracy. CBC Latest Ref Rng & Units 02/17/2017 02/16/2017 05/25/2015  WBC 4.0 - 10.5 K/uL 8.0 10.9(H) 6.8  Hemoglobin 13.0 - 17.0 g/dL 15.4 16.7 13.2  Hematocrit 39 - 52 % 46.0 47.3 38.3(L)  Platelets 150 - 400 K/uL 201 214 309   CMP Latest Ref Rng & Units 02/17/2017 02/16/2017 05/25/2015  Glucose 65 - 99 mg/dL 207(H) 137(H) 110(H)  BUN 6 - 20 mg/dL 12 17 12   Creatinine 0.61 - 1.24 mg/dL 0.83 1.32(H) 0.71  Sodium 135 - 145 mmol/L 135 138 133(L)  Potassium 3.5 - 5.1 mmol/L 4.4 4.2 4.1  Chloride 101 - 111 mmol/L 103 106 97(L)  CO2 22 -  32 mmol/L 21(L) 23 23  Calcium 8.9 - 10.3 mg/dL 8.9 8.7(L) 8.6(L)  Total Protein 6.5 - 8.1 g/dL - 6.8 -  Total Bilirubin 0.3 - 1.2 mg/dL - 0.5 -  Alkaline Phos 38 - 126 U/L - 71 -  AST 15 - 41 U/L - 21 -  ALT 17 - 63 U/L - 36 -    Lab Results  Component Value Date   TSH 0.260 (L) 02/17/2017   Lab Results  Component Value Date   ALBUMIN 3.7 02/16/2017   ANIONGAP 11 02/17/2017   Lab Results  Component Value Date   CHOL 177 05/24/2015   HDL 33 (L) 05/24/2015   LDLCALC 127 (H) 05/24/2015   CHOLHDL 5.4 05/24/2015   Lab Results  Component Value Date   TRIG 84 05/24/2015   Lab Results  Component Value Date   HGBA1C 5.5 05/24/2015      Assessment & Plan:   Problem List Items Addressed This Visit      Cardiovascular and Mediastinum    Essential hypertension    Stable on medication.       Aortic atherosclerosis ([place]) - Primary    Pt is on statin for that. He was also advised to quit smoking and lose weight as well as walk daily.       RESOLVED: Atrial fibrillation with rapid ventricular response ([facility])     Respiratory   COPD with acute exacerbation ([place])    Pt was advised to quit smoking. He was also advised to take COVID19 and pneumonia vaccine. We'll start him on inhalebroncodialater therapy with albuterol         Other   Tobacco abuse    - I instructed the patient to stop smoking and provided them with smoking cessation materials.  - I informed the patient that smoking puts them at increased risk for cancer, COPD, hypertension, and more.  - Informed the patient to seek help if they begin to have trouble breathing, develop chest pain, start to cough up blood, feel faint, or pass out.        SOB (shortness of breath)    Pt complained of SOB on exertion. He has COPD due to smoking; he was advised to quit smoking completely. We'll start him on albuterol inhalation therapy. He was also advised to lose weight. We'll get an echocardiogram to evaluate LV function.       Low TSH level    We ordered TSH level to be done.          No orders of the defined types were placed in this encounter.   EKG: normal sinus rhythm, poor R wave transition V1-V2-V3    Follow-up: No follow-ups on file.    Dr. Jane Canary [facility] [address] [place], [place] [address]   By signing my name below, I, [employer], attest that this documentation has been prepared under the direction and in the presence of Cletis Athens, MD. Electronically Signed: Cletis Athens, MD 09/29/19, 1:08 PM   I personally performed the services described in this documentation, which was SCRIBED in my presence. The recorded information has been reviewed and considered accurate. It has been edited as necessary during review. Cletis Athens, MD

## 2019-09-29 NOTE — Assessment & Plan Note (Signed)
We ordered TSH level to be done.

## 2019-10-01 ENCOUNTER — Other Ambulatory Visit: Payer: Self-pay | Admitting: Internal Medicine

## 2019-10-13 ENCOUNTER — Other Ambulatory Visit (INDEPENDENT_AMBULATORY_CARE_PROVIDER_SITE_OTHER): Payer: Medicare HMO

## 2019-10-13 ENCOUNTER — Other Ambulatory Visit: Payer: Self-pay

## 2019-10-13 DIAGNOSIS — R0602 Shortness of breath: Secondary | ICD-10-CM | POA: Diagnosis not present

## 2019-10-13 DIAGNOSIS — R600 Localized edema: Secondary | ICD-10-CM | POA: Diagnosis not present

## 2019-10-13 DIAGNOSIS — R0902 Hypoxemia: Secondary | ICD-10-CM | POA: Diagnosis not present

## 2019-10-13 DIAGNOSIS — I4892 Unspecified atrial flutter: Secondary | ICD-10-CM

## 2019-10-13 DIAGNOSIS — F17200 Nicotine dependence, unspecified, uncomplicated: Secondary | ICD-10-CM

## 2019-10-13 MED ORDER — AMLODIPINE BESYLATE 10 MG PO TABS: 10.0000 mg | ORAL_TABLET | Freq: Every day | ORAL | 1 refills | Status: DC

## 2019-10-13 MED ORDER — FUROSEMIDE 20 MG PO TABS: 20.0000 mg | ORAL_TABLET | Freq: Every day | ORAL | 1 refills | Status: DC

## 2019-10-13 MED ORDER — APIXABAN 5 MG PO TABS: 5.0000 mg | ORAL_TABLET | Freq: Two times a day (BID) | ORAL | 1 refills | Status: DC

## 2019-10-13 MED ORDER — PANTOPRAZOLE SODIUM 40 MG PO TBEC: 40.0000 mg | DELAYED_RELEASE_TABLET | Freq: Every day | ORAL | 1 refills | Status: DC

## 2019-10-13 NOTE — Progress Notes (Unsigned)
Established Patient Office Visit  Subjective:  Patient ID: Gregory Lane, male    DOB: 1950-07-02  Age: 69 y.o. MRN: [id number]  CC:  Chief Complaint  Patient presents with  . echo    HPI  Gregory Lane presents for echo  To r/o cardiomyopathy  He presented with shortness of breath. He has a history of atrial flutter. He smokes. He also has a history of COPD.   Past Medical History:  Diagnosis Date  . Aortic atherosclerosis ([place]) 02/16/2017  . Arthritis   . Blind left eye   . Hypertension   . Low TSH level 02/17/2017  . Myocardial infarction ([place])   . Perforated duodenal ulcer ([place])   . Stroke [facility])     Past Surgical History:  Procedure Laterality Date  . EXPLORATORY LAPAROTOMY  05/12/2015  . EYE SURGERY    . FRACTURE SURGERY    . LAPAROTOMY N/A 05/10/2015   Procedure: EXPLORATORY LAPAROTOMY;  Surgeon: Ralene Ok, MD;  Location: [place];  Service: General;  Laterality: N/A;  . LEFT HEART CATH AND CORONARY ANGIOGRAPHY N/A 05/02/2016   Procedure: Left Heart Cath and Coronary Angiography;  Surgeon: Adrian Prows, MD;  Location: [place] [facility];  Service: Cardiovascular;  Laterality: N/A;    Family History  Problem Relation Age of Onset  . Cirrhosis Father   . Hypertension Mother   . Lung cancer Sister   . Hypertension Sister   . Arthritis Sister   . Heart murmur Sister     Social History   Socioeconomic History  . Marital status: Single    Spouse name: Not on file  . Number of children: Not on file  . Years of education: Not on file  . Highest education level: Not on file  Occupational History  . Not on file  Tobacco Use  . Smoking status: Current Every Day Smoker    Packs/day: 1.00    Years: 60.00    Pack years: 60.00    Types: Cigarettes  . Smokeless tobacco: Never Used  Substance and Sexual Activity  . Alcohol use: No    Alcohol/week: 60.0 standard drinks    Types: 60 Standard drinks or equivalent per week    Comment: daily 12 pk of beer    . Drug use: No  . Sexual activity: Not on file  Other Topics Concern  . 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 Lives alone   Social Determinants of Health   Financial Resource Strain:   . Difficulty of Paying Living Expenses: Not on file  Food Insecurity:   . Worried About Charity fundraiser in the Last Year: Not on file  . Ran Out of Food in the Last Year: Not on file  Transportation Needs:   . Lack of Transportation (Medical): Not on file  . Lack of Transportation (Non-Medical): Not on file  Physical Activity:   . Days of Exercise per Week: Not on file  . Minutes of Exercise per Session: Not on file  Stress:   . Feeling of Stress : Not on file  Social Connections:   . Frequency of Communication with Friends and Family: Not on file  . Frequency of Social Gatherings with Friends and Family: Not on file  . Attends Religious Services: Not on file  . Active Member of Clubs or Organizations: Not on file  . Attends Archivist Meetings: Not on file  . Marital Status: Not on file  Intimate  Partner Violence:   . Fear of Current or Ex-Partner: Not on file  . Emotionally Abused: Not on file  . Physically Abused: Not on file  . Sexually Abused: Not on file     Current Outpatient Medications:  .  albuterol (PROVENTIL HFA;VENTOLIN HFA) 108 (90 Base) MCG/ACT inhaler, Inhale 2 puffs into the lungs every 6 (six) hours as needed for wheezing or shortness of breath., Disp: 1 Inhaler, Rfl: 1 .  amLODipine (NORVASC) 10 MG tablet, Take 1 tablet (10 mg total) by mouth daily., Disp: 90 tablet, Rfl: 1 .  atorvastatin (LIPITOR) 20 MG tablet, Take 20 mg by mouth daily., Disp: , Rfl:  .  budesonide-formoterol (SYMBICORT) 160-4.5 MCG/ACT inhaler, TAKE 2 PUFFS BY MOUTH TWICE A DAY, Disp: 1 each, Rfl: 5 .  buPROPion (WELLBUTRIN XL) 150 MG 24 hr tablet, Take 2 tablets (300 mg total) by mouth daily., Disp: 90 tablet, Rfl: 1 .  diltiazem (CARDIZEM SR) 120 MG 12 hr capsule,  TAKE 1 CAPSULE BY MOUTH EVERY 12 HOURS, Disp: 180 capsule, Rfl: 1 .  ELIQUIS 5 MG TABS tablet, TAKE 1 TABLET BY MOUTH TWICE A DAY, Disp: 60 tablet, Rfl: 6 .  FLUoxetine (PROZAC) 20 MG capsule, Take 40 mg by mouth daily., Disp: , Rfl:  .  furosemide (LASIX) 20 MG tablet, Take 1 tablet (20 mg total) by mouth daily., Disp: 90 tablet, Rfl: 1 .  isosorbide mononitrate (IMDUR) 60 MG 24 hr tablet, Take 60 mg by mouth daily., Disp: , Rfl:  .  lisinopril (ZESTRIL) 40 MG tablet, TAKE 1 TABLET BY MOUTH EVERY DAY, Disp: 90 tablet, Rfl: 1 .  metoprolol tartrate (LOPRESSOR) 25 MG tablet, Take 0.5 tablets (12.5 mg total) by mouth 2 (two) times daily. (Patient taking differently: Take 25 mg by mouth 2 (two) times daily. ), Disp: 15 tablet, Rfl: 0 .  pantoprazole (PROTONIX) 40 MG tablet, Take 1 tablet (40 mg total) by mouth daily., Disp: 90 tablet, Rfl: 1 .  potassium chloride SA (KLOR-CON) 20 MEQ tablet, Take 1 tablet (20 mEq total) by mouth 2 (two) times daily., Disp: 8 tablet, Rfl: 0 .  predniSONE (DELTASONE) 20 MG tablet, Take 2 tablets (40 mg total) by mouth daily with breakfast., Disp: 8 tablet, Rfl: 0   No Known Allergies  ROS Review of Systems  Constitutional: Negative.   HENT: Negative.   Eyes: Negative.   Respiratory: Positive for shortness of breath.   Cardiovascular: Positive for palpitations.  Gastrointestinal: Negative.   Endocrine: Negative.   Genitourinary: Negative.   Musculoskeletal: Negative.   Skin: Negative.   Allergic/Immunologic: Negative.   Neurological: Negative.   Hematological: Negative.   Psychiatric/Behavioral: Negative.   All other systems reviewed and are negative.     Objective:    Physical Exam  There were no vitals taken for this visit. Wt Readings from Last 3 Encounters:  09/29/19 234 lb 3.2 oz (106.2 kg)  08/17/17 272 lb (123.4 kg)  02/17/17 271 lb 9.6 oz (123.2 kg)     Health Maintenance Due  Topic Date Due  . Hepatitis C Screening  Never done  .  TETANUS/TDAP  Never done  . COLONOSCOPY  Never done  . PNA vac Low Risk Adult (2 of 2 - PCV13) 05/14/2016  . INFLUENZA VACCINE  09/21/2019    There are no preventive care reminders to display for this patient.  Lab Results  Component Value Date   TSH 0.260 (L) 02/17/2017   Lab Results  Component Value Date  WBC 8.0 02/17/2017   HGB 15.4 02/17/2017   HCT 46.0 02/17/2017   MCV 100.0 02/17/2017   PLT 201 02/17/2017   Lab Results  Component Value Date   NA 135 02/17/2017   K 4.4 02/17/2017   CO2 21 (L) 02/17/2017   GLUCOSE 207 (H) 02/17/2017   BUN 12 02/17/2017   CREATININE 0.83 02/17/2017   BILITOT 0.5 02/16/2017   ALKPHOS 71 02/16/2017   AST 21 02/16/2017   ALT 36 02/16/2017   PROT 6.8 02/16/2017   ALBUMIN 3.7 02/16/2017   CALCIUM 8.9 02/17/2017   ANIONGAP 11 02/17/2017   Lab Results  Component Value Date   CHOL 177 05/24/2015   Lab Results  Component Value Date   HDL 33 (L) 05/24/2015   Lab Results  Component Value Date   LDLCALC 127 (H) 05/24/2015   Lab Results  Component Value Date   TRIG 84 05/24/2015   Lab Results  Component Value Date   CHOLHDL 5.4 05/24/2015   Lab Results  Component Value Date   HGBA1C 5.5 05/24/2015      Assessment & Plan:   Problem List Items Addressed This Visit      Cardiovascular and Mediastinum   Atrial flutter with rapid ventricular response ([place])    Will get echo  Pt  Is on eliquis      Relevant Medications   furosemide (LASIX) 20 MG tablet   amLODipine (NORVASC) 10 MG tablet     Respiratory   Hypoxia    Stable now pt to  Quit smoking        Other   Smoking    - I instructed the patient to stop smoking and provided them with smoking cessation materials.  - I informed the patient that smoking puts them at increased risk for cancer, COPD, hypertension, and more.  - Informed the patient to seek help if they begin to have trouble breathing, develop chest pain, start to cough up blood, feel faint, or pass  out.       SOB (shortness of breath) - Primary    Get echo  Advised to lose wt      Bilateral edema of lower extremity    Will get echo         Meds ordered this encounter  Medications  . pantoprazole (PROTONIX) 40 MG tablet    Sig: Take 1 tablet (40 mg total) by mouth daily.    Dispense:  90 tablet    Refill:  1  . furosemide (LASIX) 20 MG tablet    Sig: Take 1 tablet (20 mg total) by mouth daily.    Dispense:  90 tablet    Refill:  1  . amLODipine (NORVASC) 10 MG tablet    Sig: Take 1 tablet (10 mg total) by mouth daily.    Dispense:  90 tablet    Refill:  1  . DISCONTD: apixaban (ELIQUIS) 5 MG TABS tablet    Sig: Take 1 tablet (5 mg total) by mouth 2 (two) times daily.    Dispense:  180 tablet    Refill:  1    Echo  [address] [place], [place] [address] Phone: [phone] Fax:  [phone]  Transthoracic Echocardiogram Note  [facility] [id number] 1950-09-24  Procedure: Transthoracic Echocardiogram Indications: Cardiomyopathy and shortness of breath Verbal Consent: Obtained  Procedure Details   Technical quality: good  Resting Measurements: 2D and M-mode.  Left Ventrical:  Is dilated.  Mitral Valve:Marland Kitchen  Mitral valve is normal  Aortic Valve: Normal  Tricuspid Valve: Normal opening of the tricuspid valve  Pulmonic Valve: Not seen.  Left Atrium/ Left atrial appendage: Left atrium enlarged.  Atrial septum:   Aorta: None seen  Echocardiogram. Two-dimensional and M-mode echocardiogram was obtained in the long axis short axis view left atrium was found to be dilated as well as right atrium.  Right ventricle is borderline enlarged.  Left ventricular ejection fraction is 42%.  No pericardial effusion.  No blood clot is noted in the left atrium or in the left ventricular cavity.  Root is normal size.  Aortic valve is minimally calcified.  Septum is intact.  Flow Doppler does not reveal any evidence of  mitral tricuspid aortic regurgitation.  Cletis Athens, MD    Follow-up: No follow-ups on file.    Cletis Athens, MD

## 2019-10-13 NOTE — Assessment & Plan Note (Signed)
Will get echo  Pt  Is on eliquis

## 2019-10-13 NOTE — Assessment & Plan Note (Signed)
Will get echo

## 2019-10-13 NOTE — Assessment & Plan Note (Signed)
Get echo  Advised to lose wt

## 2019-10-13 NOTE — Assessment & Plan Note (Signed)
Stable now pt to  Quit smoking

## 2019-10-14 ENCOUNTER — Other Ambulatory Visit: Payer: Self-pay | Admitting: Internal Medicine

## 2019-10-20 NOTE — Assessment & Plan Note (Signed)
-   I instructed the patient to stop smoking and provided them with smoking cessation materials.  - I informed the patient that smoking puts them at increased risk for cancer, COPD, hypertension, and more.  - Informed the patient to seek help if they begin to have trouble breathing, develop chest pain, start to cough up blood, feel faint, or pass out.  

## 2019-10-28 ENCOUNTER — Other Ambulatory Visit: Payer: Self-pay | Admitting: Internal Medicine

## 2019-10-30 ENCOUNTER — Other Ambulatory Visit: Payer: Self-pay | Admitting: *Deleted

## 2019-10-30 MED ORDER — PANTOPRAZOLE SODIUM 40 MG PO TBEC: 40.0000 mg | DELAYED_RELEASE_TABLET | Freq: Every day | ORAL | 3 refills | Status: DC

## 2019-10-30 MED ORDER — AMLODIPINE BESYLATE 10 MG PO TABS: 10.0000 mg | ORAL_TABLET | Freq: Every day | ORAL | 3 refills | Status: DC

## 2019-10-30 MED ORDER — FUROSEMIDE 20 MG PO TABS: 20.0000 mg | ORAL_TABLET | Freq: Every day | ORAL | 3 refills | Status: DC

## 2019-11-03 ENCOUNTER — Other Ambulatory Visit: Payer: Self-pay | Admitting: *Deleted

## 2019-11-03 MED ORDER — DILTIAZEM HCL ER 120 MG PO CP12: ORAL_CAPSULE | ORAL | 3 refills | Status: DC

## 2019-11-03 MED ORDER — AMLODIPINE BESYLATE 10 MG PO TABS: 10.0000 mg | ORAL_TABLET | Freq: Every day | ORAL | 3 refills | Status: DC

## 2019-11-03 MED ORDER — FUROSEMIDE 20 MG PO TABS
20.0000 mg | ORAL_TABLET | Freq: Every day | ORAL | 3 refills | Status: DC
Start: 2019-11-03 — End: 2019-11-27

## 2019-11-03 MED ORDER — PANTOPRAZOLE SODIUM 40 MG PO TBEC
40.0000 mg | DELAYED_RELEASE_TABLET | Freq: Every day | ORAL | 3 refills | Status: DC
Start: 2019-11-03 — End: 2019-11-11

## 2019-11-03 NOTE — Addendum Note (Signed)
Addended by: Alois Cliche on: 11/03/2019 09:54 AM   Modules accepted: Orders

## 2019-11-07 ENCOUNTER — Other Ambulatory Visit: Payer: Self-pay

## 2019-11-11 ENCOUNTER — Encounter: Payer: Self-pay | Admitting: Internal Medicine

## 2019-11-11 ENCOUNTER — Ambulatory Visit (INDEPENDENT_AMBULATORY_CARE_PROVIDER_SITE_OTHER): Payer: Medicare HMO | Admitting: Internal Medicine

## 2019-11-11 ENCOUNTER — Other Ambulatory Visit: Payer: Self-pay

## 2019-11-11 ENCOUNTER — Other Ambulatory Visit: Payer: Self-pay | Admitting: *Deleted

## 2019-11-11 VITALS — BP 132/82 | HR 86 | Ht 74.0 in | Wt 236.1 lb

## 2019-11-11 DIAGNOSIS — I10 Essential (primary) hypertension: Secondary | ICD-10-CM

## 2019-11-11 DIAGNOSIS — J441 Chronic obstructive pulmonary disease with (acute) exacerbation: Secondary | ICD-10-CM

## 2019-11-11 DIAGNOSIS — F17210 Nicotine dependence, cigarettes, uncomplicated: Secondary | ICD-10-CM

## 2019-11-11 DIAGNOSIS — R0902 Hypoxemia: Secondary | ICD-10-CM

## 2019-11-11 DIAGNOSIS — I4892 Unspecified atrial flutter: Secondary | ICD-10-CM

## 2019-11-11 DIAGNOSIS — E8881 Metabolic syndrome: Secondary | ICD-10-CM

## 2019-11-11 MED ORDER — PANTOPRAZOLE SODIUM 40 MG PO TBEC
40.0000 mg | DELAYED_RELEASE_TABLET | Freq: Every day | ORAL | 3 refills | Status: DC
Start: 2019-11-11 — End: 2019-11-26

## 2019-11-11 NOTE — Assessment & Plan Note (Signed)
-   Today, the patient's blood pressure is well managed on b blockers. - The patient will continue the current treatment regimen.  - I encouraged the patient to eat a low-sodium diet to help control bloodb blockers pressure. - I encouraged the patient to live an active lifestyle and complete activities that increases heart rate to 85% target heart rate at least 5 times per week for one hour.

## 2019-11-11 NOTE — Progress Notes (Signed)
Established Patient Office Visit  Subjective:  Patient ID: Gregory Lane, male    DOB: Oct 18, 1950  Age: 69 y.o. MRN: [id number]  CC:  Chief Complaint  Patient presents with  . Cough    patient having problem with a lot of mucus/phlegm that causes episodes of coughing     HPI  Gregory Lane presents for sob  Cough pt took covid shot  Past Medical History:  Diagnosis Date  . Aortic atherosclerosis ([place]) 02/16/2017  . Arthritis   . Blind left eye   . Hypertension   . Low TSH level 02/17/2017  . Myocardial infarction ([place])   . Perforated duodenal ulcer ([place])   . Stroke [facility])     Past Surgical History:  Procedure Laterality Date  . EXPLORATORY LAPAROTOMY  05/12/2015  . EYE SURGERY    . FRACTURE SURGERY    . LAPAROTOMY N/A 05/10/2015   Procedure: EXPLORATORY LAPAROTOMY;  Surgeon: Ralene Ok, MD;  Location: [place];  Service: General;  Laterality: N/A;  . LEFT HEART CATH AND CORONARY ANGIOGRAPHY N/A 05/02/2016   Procedure: Left Heart Cath and Coronary Angiography;  Surgeon: Adrian Prows, MD;  Location: [place] [facility];  Service: Cardiovascular;  Laterality: N/A;    Family History  Problem Relation Age of Onset  . Cirrhosis Father   . Hypertension Mother   . Lung cancer Sister   . Hypertension Sister   . Arthritis Sister   . Heart murmur Sister     Social History   Socioeconomic History  . Marital status: Single    Spouse name: Not on file  . Number of children: Not on file  . Years of education: Not on file  . Highest education level: Not on file  Occupational History  . Not on file  Tobacco Use  . Smoking status: Current Every Day Smoker    Packs/day: 1.00    Years: 60.00    Pack years: 60.00    Types: Cigarettes  . Smokeless tobacco: Never Used  Substance and Sexual Activity  . Alcohol use: No    Alcohol/week: 60.0 standard drinks    Types: 60 Standard drinks or equivalent per week    Comment: daily 12 pk of beer  . Drug use: No  . Sexual  activity: Not on file  Other Topics Concern  . 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 Lives alone   Social Determinants of Health   Financial Resource Strain:   . Difficulty of Paying Living Expenses: Not on file  Food Insecurity:   . Worried About Charity fundraiser in the Last Year: Not on file  . Ran Out of Food in the Last Year: Not on file  Transportation Needs:   . Lack of Transportation (Medical): Not on file  . Lack of Transportation (Non-Medical): Not on file  Physical Activity:   . Days of Exercise per Week: Not on file  . Minutes of Exercise per Session: Not on file  Stress:   . Feeling of Stress : Not on file  Social Connections:   . Frequency of Communication with Friends and Family: Not on file  . Frequency of Social Gatherings with Friends and Family: Not on file  . Attends Religious Services: Not on file  . Active Member of Clubs or Organizations: Not on file  . Attends Archivist Meetings: Not on file  . Marital Status: Not on file  Intimate Partner Violence:   . Fear  of Current or Ex-Partner: Not on file  . Emotionally Abused: Not on file  . Physically Abused: Not on file  . Sexually Abused: Not on file     Current Outpatient Medications:  .  albuterol (PROVENTIL HFA;VENTOLIN HFA) 108 (90 Base) MCG/ACT inhaler, Inhale 2 puffs into the lungs every 6 (six) hours as needed for wheezing or shortness of breath., Disp: 1 Inhaler, Rfl: 1 .  amLODipine (NORVASC) 10 MG tablet, Take 1 tablet (10 mg total) by mouth daily., Disp: 90 tablet, Rfl: 3 .  atorvastatin (LIPITOR) 20 MG tablet, Take 20 mg by mouth daily., Disp: , Rfl:  .  budesonide-formoterol (SYMBICORT) 160-4.5 MCG/ACT inhaler, TAKE 2 PUFFS BY MOUTH TWICE A DAY, Disp: 1 each, Rfl: 5 .  buPROPion (WELLBUTRIN XL) 150 MG 24 hr tablet, Take 2 tablets (300 mg total) by mouth daily., Disp: 90 tablet, Rfl: 1 .  diltiazem (CARDIZEM SR) 120 MG 12 hr capsule, TAKE 1 CAPSULE BY MOUTH EVERY  12 HOURS, Disp: 180 capsule, Rfl: 3 .  ELIQUIS 5 MG TABS tablet, TAKE 1 TABLET BY MOUTH TWICE A DAY, Disp: 60 tablet, Rfl: 6 .  FLUoxetine (PROZAC) 20 MG capsule, Take 40 mg by mouth daily., Disp: , Rfl:  .  furosemide (LASIX) 20 MG tablet, Take 1 tablet (20 mg total) by mouth daily., Disp: 90 tablet, Rfl: 3 .  isosorbide mononitrate (IMDUR) 60 MG 24 hr tablet, Take 60 mg by mouth daily., Disp: , Rfl:  .  lisinopril (ZESTRIL) 40 MG tablet, TAKE 1 TABLET BY MOUTH EVERY DAY, Disp: 90 tablet, Rfl: 1 .  metoprolol tartrate (LOPRESSOR) 25 MG tablet, Take 0.5 tablets (12.5 mg total) by mouth 2 (two) times daily. (Patient taking differently: Take 25 mg by mouth 2 (two) times daily. ), Disp: 15 tablet, Rfl: 0 .  pantoprazole (PROTONIX) 40 MG tablet, Take 1 tablet (40 mg total) by mouth daily., Disp: 90 tablet, Rfl: 3 .  potassium chloride SA (KLOR-CON) 20 MEQ tablet, Take 1 tablet (20 mEq total) by mouth 2 (two) times daily., Disp: 8 tablet, Rfl: 0 .  predniSONE (DELTASONE) 20 MG tablet, Take 2 tablets (40 mg total) by mouth daily with breakfast., Disp: 8 tablet, Rfl: 0   No Known Allergies  ROS Review of Systems  Constitutional: Negative.   HENT: Negative.   Eyes: Negative.  Negative for redness.  Respiratory: Positive for cough. Negative for choking.   Cardiovascular: Negative.  Negative for chest pain, palpitations and leg swelling.  Gastrointestinal: Negative.  Negative for abdominal pain.  Endocrine: Negative.   Genitourinary: Negative.  Negative for flank pain.  Musculoskeletal: Negative.  Negative for back pain and joint swelling.  Skin: Negative.   Allergic/Immunologic: Negative.   Neurological: Negative.   Hematological: Negative.   Psychiatric/Behavioral: Negative.   All other systems reviewed and are negative.     Objective:    Physical Exam Vitals reviewed.  Constitutional:      Appearance: Normal appearance. He is obese.  HENT:     Mouth/Throat:     Mouth: Mucous  membranes are moist.  Eyes:     General: Scleral icterus: doxycycl.     Pupils: Pupils are equal, round, and reactive to light.  Neck:     Vascular: No carotid bruit.  Cardiovascular:     Rate and Rhythm: Normal rate and regular rhythm.     Pulses: Normal pulses.     Heart sounds: Normal heart sounds.  Pulmonary:     Effort: Pulmonary effort  is normal.     Breath sounds: Normal breath sounds.  Abdominal:     General: Bowel sounds are normal.     Palpations: Abdomen is soft. There is no hepatomegaly, splenomegaly or mass.     Tenderness: There is no abdominal tenderness.     Hernia: No hernia is present.  Musculoskeletal:     Cervical back: Neck supple. No tenderness.     Right lower leg: No edema.     Left lower leg: No edema.  Skin:    Findings: No rash.  Neurological:     Mental Status: He is alert and oriented to person, place, and time.     Motor: No weakness.  Psychiatric:        Mood and Affect: Mood normal.        Behavior: Behavior normal.     BP 132/82   Pulse 86   Ht 6\' 2"  (1.88 m)   Wt 236 lb 1.6 oz (107.1 kg)   BMI 30.31 kg/m  Wt Readings from Last 3 Encounters:  11/11/19 236 lb 1.6 oz (107.1 kg)  09/29/19 234 lb 3.2 oz (106.2 kg)  08/17/17 272 lb (123.4 kg)     Health Maintenance Due  Topic Date Due  . Hepatitis C Screening  Never done  . TETANUS/TDAP  Never done  . COLONOSCOPY  Never done  . PNA vac Low Risk Adult (2 of 2 - PCV13) 05/14/2016  . INFLUENZA VACCINE  Never done  . COVID-19 Vaccine (2 - Moderna 2-dose series) 10/27/2019    There are no preventive care reminders to display for this patient.  Lab Results  Component Value Date   TSH 0.260 (L) 02/17/2017   Lab Results  Component Value Date   WBC 8.0 02/17/2017   HGB 15.4 02/17/2017   HCT 46.0 02/17/2017   MCV 100.0 02/17/2017   PLT 201 02/17/2017   Lab Results  Component Value Date   NA 135 02/17/2017   K 4.4 02/17/2017   CO2 21 (L) 02/17/2017   GLUCOSE 207 (H)  02/17/2017   BUN 12 02/17/2017   CREATININE 0.83 02/17/2017   BILITOT 0.5 02/16/2017   ALKPHOS 71 02/16/2017   AST 21 02/16/2017   ALT 36 02/16/2017   PROT 6.8 02/16/2017   ALBUMIN 3.7 02/16/2017   CALCIUM 8.9 02/17/2017   ANIONGAP 11 02/17/2017   Lab Results  Component Value Date   CHOL 177 05/24/2015   Lab Results  Component Value Date   HDL 33 (L) 05/24/2015   Lab Results  Component Value Date   LDLCALC 127 (H) 05/24/2015   Lab Results  Component Value Date   TRIG 84 05/24/2015   Lab Results  Component Value Date   CHOLHDL 5.4 05/24/2015   Lab Results  Component Value Date   HGBA1C 5.5 05/24/2015      Assessment & Plan:   Problem List Items Addressed This Visit      Cardiovascular and Mediastinum   Essential hypertension - Primary    - Today, the patient's blood pressure is well managed on b blockers. - The patient will continue the current treatment regimen.  - I encouraged the patient to eat a low-sodium diet to help control bloodb blockers pressure. - I encouraged the patient to live an active lifestyle and complete activities that increases heart rate to 85% target heart rate at least 5 times per week for one hour.          Atrial flutter with rapid  ventricular response ([facility])    nsr  Now on eliquis        Respiratory   Hypoxia    O2 sat  96%      COPD with acute exacerbation ([place])   Relevant Orders   EKG 12-Lead     Other   Cigarette smoker    - I instructed the patient to stop smoking and provided them with smoking cessation materials.  - I informed the patient that smoking puts them at increased risk for cancer, COPD, hypertension, and more.  - Informed the patient to seek help if they begin to have trouble breathing, develop chest pain, start to cough up blood, feel faint, or pass out.        Metabolic syndrome    - I encouraged the patient to lose weight.  - I educated them on making healthy dietary choices including eating more  fruits and vegetables and less fried foods. - I encouraged the patient to exercise more, and educated on the benefits of exercise including weight loss, diabetes prevention, and hypertension prevention.           No orders of the defined types were placed in this encounter.   Follow-up Report of the left electrocardiogram.  Electrocardiogram revealed normal sinus rhythm old anteroseptal myocardial infarction no arrhythmia was noted.   Cletis Athens, MDekg

## 2019-11-11 NOTE — Assessment & Plan Note (Signed)
O2 sat  96%

## 2019-11-11 NOTE — Assessment & Plan Note (Signed)
-   I encouraged the patient to lose weight.  - I educated them on making healthy dietary choices including eating more fruits and vegetables and less fried foods. - I encouraged the patient to exercise more, and educated on the benefits of exercise including weight loss, diabetes prevention, and hypertension prevention.   

## 2019-11-11 NOTE — Assessment & Plan Note (Signed)
-   I instructed the patient to stop smoking and provided them with smoking cessation materials.  - I informed the patient that smoking puts them at increased risk for cancer, COPD, hypertension, and more.  - Informed the patient to seek help if they begin to have trouble breathing, develop chest pain, start to cough up blood, feel faint, or pass out.  

## 2019-11-11 NOTE — Assessment & Plan Note (Signed)
nsr  Now on eliquis

## 2019-11-26 ENCOUNTER — Other Ambulatory Visit: Payer: Self-pay | Admitting: *Deleted

## 2019-11-26 MED ORDER — DILTIAZEM HCL ER 120 MG PO CP12: ORAL_CAPSULE | ORAL | 3 refills | Status: DC

## 2019-11-26 MED ORDER — PANTOPRAZOLE SODIUM 40 MG PO TBEC
40.0000 mg | DELAYED_RELEASE_TABLET | Freq: Every day | ORAL | 3 refills | Status: DC
Start: 2019-11-26 — End: 2019-11-28

## 2019-11-26 MED ORDER — AMLODIPINE BESYLATE 10 MG PO TABS
10.0000 mg | ORAL_TABLET | Freq: Every day | ORAL | 3 refills | Status: DC
Start: 2019-11-26 — End: 2019-11-28

## 2019-11-27 ENCOUNTER — Other Ambulatory Visit: Payer: Self-pay

## 2019-11-27 MED ORDER — FUROSEMIDE 20 MG PO TABS
20.0000 mg | ORAL_TABLET | Freq: Every day | ORAL | 3 refills | Status: DC
Start: 2019-11-27 — End: 2020-10-21

## 2019-11-28 ENCOUNTER — Other Ambulatory Visit: Payer: Self-pay | Admitting: *Deleted

## 2019-11-28 MED ORDER — PANTOPRAZOLE SODIUM 40 MG PO TBEC
40.0000 mg | DELAYED_RELEASE_TABLET | Freq: Every day | ORAL | 3 refills | Status: DC
Start: 2019-11-28 — End: 2019-12-04

## 2019-11-28 MED ORDER — DILTIAZEM HCL ER 120 MG PO CP12: ORAL_CAPSULE | ORAL | 3 refills | Status: DC

## 2019-11-28 MED ORDER — AMLODIPINE BESYLATE 10 MG PO TABS
10.0000 mg | ORAL_TABLET | Freq: Every day | ORAL | 3 refills | Status: DC
Start: 2019-11-28 — End: 2019-12-04

## 2019-12-04 ENCOUNTER — Other Ambulatory Visit: Payer: Self-pay | Admitting: *Deleted

## 2019-12-04 MED ORDER — AMLODIPINE BESYLATE 10 MG PO TABS: 10.0000 mg | ORAL_TABLET | Freq: Every day | ORAL | 3 refills | Status: DC

## 2019-12-04 MED ORDER — PANTOPRAZOLE SODIUM 40 MG PO TBEC
40.0000 mg | DELAYED_RELEASE_TABLET | Freq: Every day | ORAL | 3 refills | Status: DC
Start: 2019-12-04 — End: 2020-10-21

## 2019-12-04 MED ORDER — DILTIAZEM HCL ER 120 MG PO CP12: ORAL_CAPSULE | ORAL | 3 refills | Status: DC

## 2019-12-11 ENCOUNTER — Ambulatory Visit: Payer: Medicare HMO | Admitting: Family Medicine

## 2019-12-19 ENCOUNTER — Ambulatory Visit (INDEPENDENT_AMBULATORY_CARE_PROVIDER_SITE_OTHER): Payer: Medicare HMO | Admitting: Internal Medicine

## 2019-12-19 ENCOUNTER — Other Ambulatory Visit: Payer: Self-pay

## 2019-12-19 ENCOUNTER — Ambulatory Visit: Payer: Medicare HMO | Admitting: Family Medicine

## 2019-12-19 DIAGNOSIS — R0602 Shortness of breath: Secondary | ICD-10-CM

## 2019-12-19 DIAGNOSIS — E8881 Metabolic syndrome: Secondary | ICD-10-CM | POA: Diagnosis not present

## 2019-12-19 DIAGNOSIS — J449 Chronic obstructive pulmonary disease, unspecified: Secondary | ICD-10-CM

## 2019-12-19 DIAGNOSIS — E785 Hyperlipidemia, unspecified: Secondary | ICD-10-CM | POA: Diagnosis not present

## 2019-12-19 DIAGNOSIS — R7989 Other specified abnormal findings of blood chemistry: Secondary | ICD-10-CM

## 2019-12-19 DIAGNOSIS — I10 Essential (primary) hypertension: Secondary | ICD-10-CM

## 2019-12-20 LAB — CBC WITH DIFFERENTIAL/PLATELET
Absolute Monocytes: 504 cells/uL (ref 200–950)
Basophils Absolute: 102 cells/uL (ref 0–200)
Basophils Relative: 1.4 %
Eosinophils Absolute: 248 cells/uL (ref 15–500)
Eosinophils Relative: 3.4 %
HCT: 45.2 % (ref 38.5–50.0)
Hemoglobin: 16.1 g/dL (ref 13.2–17.1)
Lymphs Abs: 1942 cells/uL (ref 850–3900)
MCH: 35.9 pg — ABNORMAL HIGH (ref 27.0–33.0)
MCHC: 35.6 g/dL (ref 32.0–36.0)
MCV: 100.7 fL — ABNORMAL HIGH (ref 80.0–100.0)
MPV: 10 fL (ref 7.5–12.5)
Monocytes Relative: 6.9 %
Neutro Abs: 4504 cells/uL (ref 1500–7800)
Neutrophils Relative %: 61.7 %
Platelets: 243 10*3/uL (ref 140–400)
RBC: 4.49 10*6/uL (ref 4.20–5.80)
RDW: 13.9 % (ref 11.0–15.0)
Total Lymphocyte: 26.6 %
WBC: 7.3 10*3/uL (ref 3.8–10.8)

## 2019-12-20 LAB — LIPID PANEL
Cholesterol: 235 mg/dL — ABNORMAL HIGH (ref <200)
HDL: 46 mg/dL (ref >=40)
LDL Cholesterol (Calc): 160 mg/dL (calc) — ABNORMAL HIGH
Non-HDL Cholesterol (Calc): 189 mg/dL (calc) — ABNORMAL HIGH (ref <130)
Total CHOL/HDL Ratio: 5.1 (calc) — ABNORMAL HIGH (ref <5.0)
Triglycerides: 155 mg/dL — ABNORMAL HIGH (ref <150)

## 2019-12-20 LAB — COMPLETE METABOLIC PANEL WITH GFR
AG Ratio: 1.6 (calc) (ref 1.0–2.5)
ALT: 20 U/L (ref 9–46)
AST: 13 U/L (ref 10–35)
Albumin: 4.5 g/dL (ref 3.6–5.1)
Alkaline phosphatase (APISO): 93 U/L (ref 35–144)
BUN: 12 mg/dL (ref 7–25)
CO2: 23 mmol/L (ref 20–32)
Calcium: 9.2 mg/dL (ref 8.6–10.3)
Chloride: 101 mmol/L (ref 98–110)
Creat: 0.91 mg/dL (ref 0.70–1.18)
GFR, Est African American: 98 mL/min/{1.73_m2} (ref >=60)
GFR, Est Non African American: 84 mL/min/{1.73_m2} (ref >=60)
Globulin: 2.9 g/dL (calc) (ref 1.9–3.7)
Glucose, Bld: 103 mg/dL — ABNORMAL HIGH (ref 65–99)
Potassium: 4.3 mmol/L (ref 3.5–5.3)
Sodium: 138 mmol/L (ref 135–146)
Total Bilirubin: 0.5 mg/dL (ref 0.2–1.2)
Total Protein: 7.4 g/dL (ref 6.1–8.1)

## 2019-12-20 LAB — PSA: PSA: 2.55 ng/mL (ref <=4.0)

## 2019-12-20 LAB — TSH: TSH: 1.78 mIU/L (ref 0.40–4.50)

## 2019-12-24 ENCOUNTER — Other Ambulatory Visit: Payer: Self-pay

## 2019-12-26 ENCOUNTER — Encounter: Payer: Self-pay | Admitting: Family Medicine

## 2019-12-26 ENCOUNTER — Ambulatory Visit (INDEPENDENT_AMBULATORY_CARE_PROVIDER_SITE_OTHER): Payer: Medicare HMO | Admitting: Family Medicine

## 2019-12-26 ENCOUNTER — Other Ambulatory Visit: Payer: Self-pay

## 2019-12-26 VITALS — BP 148/62 | HR 82 | Resp 20 | Ht 73.0 in | Wt 237.1 lb

## 2019-12-26 DIAGNOSIS — S60511A Abrasion of right hand, initial encounter: Secondary | ICD-10-CM | POA: Insufficient documentation

## 2019-12-26 DIAGNOSIS — Z1211 Encounter for screening for malignant neoplasm of colon: Secondary | ICD-10-CM

## 2019-12-26 DIAGNOSIS — J449 Chronic obstructive pulmonary disease, unspecified: Secondary | ICD-10-CM

## 2019-12-26 DIAGNOSIS — Z23 Encounter for immunization: Secondary | ICD-10-CM | POA: Diagnosis not present

## 2019-12-26 DIAGNOSIS — I10 Essential (primary) hypertension: Secondary | ICD-10-CM | POA: Diagnosis not present

## 2019-12-26 DIAGNOSIS — F17200 Nicotine dependence, unspecified, uncomplicated: Secondary | ICD-10-CM | POA: Diagnosis not present

## 2019-12-26 DIAGNOSIS — E782 Mixed hyperlipidemia: Secondary | ICD-10-CM | POA: Diagnosis not present

## 2019-12-26 DIAGNOSIS — Z0000 Encounter for general adult medical examination without abnormal findings: Secondary | ICD-10-CM | POA: Diagnosis not present

## 2019-12-26 DIAGNOSIS — E785 Hyperlipidemia, unspecified: Secondary | ICD-10-CM | POA: Insufficient documentation

## 2019-12-26 MED ORDER — ATORVASTATIN CALCIUM 40 MG PO TABS
20.0000 mg | ORAL_TABLET | Freq: Every day | ORAL | 6 refills | Status: DC
Start: 2019-12-26 — End: 2021-09-06

## 2019-12-26 NOTE — Assessment & Plan Note (Signed)
Smokes 1/2 ppd decreased from 2 packs.

## 2019-12-26 NOTE — Assessment & Plan Note (Signed)
Pt states unsure of last Pulm Eval, he is requesting Oxygen evaluation from Pulmonology

## 2019-12-26 NOTE — Assessment & Plan Note (Signed)
Multiple abrasions R hand, TDAP today

## 2019-12-26 NOTE — Assessment & Plan Note (Signed)
Htn not at goal but stable, continue meds for now.

## 2019-12-26 NOTE — Progress Notes (Signed)
Established Patient Office Visit  SUBJECTIVE:  Subjective  Patient ID: Gregory Lane, male    DOB: 1950-12-14  Age: 69 y.o. MRN: [id number]  CC:  Chief Complaint  Patient presents with  . Annual Exam    HPI Gregory Lane is a 69 y.o. male presenting today for annual exam, lab discussion and COPD management.   Past Medical History:  Diagnosis Date  . Aortic atherosclerosis ([place]) 02/16/2017  . Arthritis   . Blind left eye   . Hypertension   . Low TSH level 02/17/2017  . Myocardial infarction ([place])   . Perforated duodenal ulcer ([name])   . Stroke [facility])     Past Surgical History:  Procedure Laterality Date  . EXPLORATORY LAPAROTOMY  05/12/2015  . EYE SURGERY    . FRACTURE SURGERY    . LAPAROTOMY N/A 05/10/2015   Procedure: EXPLORATORY LAPAROTOMY;  Surgeon: Ralene Ok, MD;  Location: [place];  Service: General;  Laterality: N/A;  . LEFT HEART CATH AND CORONARY ANGIOGRAPHY N/A 05/02/2016   Procedure: Left Heart Cath and Coronary Angiography;  Surgeon: Adrian Prows, MD;  Location: [place] [facility];  Service: Cardiovascular;  Laterality: N/A;    Family History  Problem Relation Age of Onset  . Cirrhosis Father   . Hypertension Mother   . Lung cancer Sister   . Hypertension Sister   . Arthritis Sister   . Heart murmur Sister     Social History   Socioeconomic History  . Marital status: Single    Spouse name: Not on file  . Number of children: Not on file  . Years of education: Not on file  . Highest education level: Not on file  Occupational History  . Not on file  Tobacco Use  . Smoking status: Current Every Day Smoker    Packs/day: 1.00    Years: 60.00    Pack years: 60.00    Types: Cigarettes  . Smokeless tobacco: Never Used  Substance and Sexual Activity  . Alcohol use: No    Alcohol/week: 60.0 standard drinks    Types: 60 Standard drinks or equivalent per week    Comment: daily 12 pk of beer  . Drug use: No  . Sexual activity: Not on file    Other Topics Concern  . 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 Lives alone   Social Determinants of Health   Financial Resource Strain:   . Difficulty of Paying Living Expenses: Not on file  Food Insecurity:   . Worried About Charity fundraiser in the Last Year: Not on file  . Ran Out of Food in the Last Year: Not on file  Transportation Needs:   . Lack of Transportation (Medical): Not on file  . Lack of Transportation (Non-Medical): Not on file  Physical Activity:   . Days of Exercise per Week: Not on file  . Minutes of Exercise per Session: Not on file  Stress:   . Feeling of Stress : Not on file  Social Connections:   . Frequency of Communication with Friends and Family: Not on file  . Frequency of Social Gatherings with Friends and Family: Not on file  . Attends Religious Services: Not on file  . Active Member of Clubs or Organizations: Not on file  . Attends Archivist Meetings: Not on file  . Marital Status: Not on file  Intimate Partner Violence:   . Fear of Current or Ex-Partner: Not on file  .  Emotionally Abused: Not on file  . Physically Abused: Not on file  . Sexually Abused: Not on file     Current Outpatient Medications:  .  albuterol (PROVENTIL HFA;VENTOLIN HFA) 108 (90 Base) MCG/ACT inhaler, Inhale 2 puffs into the lungs every 6 (six) hours as needed for wheezing or shortness of breath., Disp: 1 Inhaler, Rfl: 1 .  amLODipine (NORVASC) 10 MG tablet, Take 1 tablet (10 mg total) by mouth daily., Disp: 90 tablet, Rfl: 3 .  atorvastatin (LIPITOR) 40 MG tablet, Take 0.5 tablets (20 mg total) by mouth daily., Disp: 90 tablet, Rfl: 6 .  budesonide-formoterol (SYMBICORT) 160-4.5 MCG/ACT inhaler, TAKE 2 PUFFS BY MOUTH TWICE A DAY, Disp: 1 each, Rfl: 5 .  buPROPion (WELLBUTRIN XL) 150 MG 24 hr tablet, Take 2 tablets (300 mg total) by mouth daily., Disp: 90 tablet, Rfl: 1 .  diltiazem (CARDIZEM SR) 120 MG 12 hr capsule, TAKE 1 CAPSULE BY  MOUTH EVERY 12 HOURS, Disp: 180 capsule, Rfl: 3 .  ELIQUIS 5 MG TABS tablet, TAKE 1 TABLET BY MOUTH TWICE A DAY, Disp: 60 tablet, Rfl: 6 .  FLUoxetine (PROZAC) 20 MG capsule, Take 40 mg by mouth daily., Disp: , Rfl:  .  furosemide (LASIX) 20 MG tablet, Take 1 tablet (20 mg total) by mouth daily., Disp: 90 tablet, Rfl: 3 .  isosorbide mononitrate (IMDUR) 60 MG 24 hr tablet, Take 60 mg by mouth daily., Disp: , Rfl:  .  lisinopril (ZESTRIL) 40 MG tablet, TAKE 1 TABLET BY MOUTH EVERY DAY, Disp: 90 tablet, Rfl: 1 .  metoprolol tartrate (LOPRESSOR) 25 MG tablet, Take 0.5 tablets (12.5 mg total) by mouth 2 (two) times daily. (Patient taking differently: Take 25 mg by mouth 2 (two) times daily. ), Disp: 15 tablet, Rfl: 0 .  pantoprazole (PROTONIX) 40 MG tablet, Take 1 tablet (40 mg total) by mouth daily., Disp: 90 tablet, Rfl: 3 .  potassium chloride SA (KLOR-CON) 20 MEQ tablet, Take 1 tablet (20 mEq total) by mouth 2 (two) times daily., Disp: 8 tablet, Rfl: 0 .  predniSONE (DELTASONE) 20 MG tablet, Take 2 tablets (40 mg total) by mouth daily with breakfast., Disp: 8 tablet, Rfl: 0   No Known Allergies  ROS Review of Systems  Constitutional: Negative.   HENT: Positive for dental problem.   Eyes: Negative.   Respiratory: Positive for cough, shortness of breath and wheezing.   Cardiovascular: Negative.   Gastrointestinal: Negative.   Genitourinary: Negative.   Neurological: Negative.      OBJECTIVE:    Physical Exam Vitals and nursing note reviewed.  Constitutional:      Appearance: He is obese.  HENT:     Head: Normocephalic.     Right Ear: Tympanic membrane normal.     Left Ear: Tympanic membrane normal.     Nose: Nose normal.     Mouth/Throat:     Mouth: Mucous membranes are moist.  Eyes:     Pupils: Pupils are equal, round, and reactive to light.  Cardiovascular:     Rate and Rhythm: Normal rate. Rhythm irregular.  Pulmonary:     Effort: No respiratory distress.     Breath  sounds: Wheezing present.  Musculoskeletal:        General: Normal range of motion.     Cervical back: Normal range of motion.  Neurological:     General: No focal deficit present.     BP (!) 148/62   Pulse 82   Resp 20  Ht 6\' 1"  (1.854 m)   Wt 237 lb 1.6 oz (107.5 kg)   BMI 31.28 kg/m  Wt Readings from Last 3 Encounters:  12/26/19 237 lb 1.6 oz (107.5 kg)  11/11/19 236 lb 1.6 oz (107.1 kg)  09/29/19 234 lb 3.2 oz (106.2 kg)    Health Maintenance Due  Topic Date Due  . Hepatitis C Screening  Never done  . COLONOSCOPY  Never done  . PNA vac Low Risk Adult (2 of 2 - PCV13) 05/14/2016  . INFLUENZA VACCINE  Never done  . COVID-19 Vaccine (2 - Moderna 2-dose series) 10/27/2019    There are no preventive care reminders to display for this patient.  CBC Latest Ref Rng & Units 12/19/2019 02/17/2017 02/16/2017  WBC 3.8 - 10.8 Thousand/uL 7.3 8.0 10.9(H)  Hemoglobin 13.2 - 17.1 g/dL 16.1 15.4 16.7  Hematocrit 38 - 50 % 45.2 46.0 47.3  Platelets 140 - 400 Thousand/uL 243 201 214   CMP Latest Ref Rng & Units 12/19/2019 02/17/2017 02/16/2017  Glucose 65 - 99 mg/dL 103(H) 207(H) 137(H)  BUN 7 - 25 mg/dL 12 12 17   Creatinine 0.70 - 1.18 mg/dL 0.91 0.83 1.32(H)  Sodium 135 - 146 mmol/L 138 135 138  Potassium 3.5 - 5.3 mmol/L 4.3 4.4 4.2  Chloride 98 - 110 mmol/L 101 103 106  CO2 20 - 32 mmol/L 23 21(L) 23  Calcium 8.6 - 10.3 mg/dL 9.2 8.9 8.7(L)  Total Protein 6.1 - 8.1 g/dL 7.4 - 6.8  Total Bilirubin 0.2 - 1.2 mg/dL 0.5 - 0.5  Alkaline Phos 38 - 126 U/L - - 71  AST 10 - 35 U/L 13 - 21  ALT 9 - 46 U/L 20 - 36    Lab Results  Component Value Date   TSH 1.78 12/19/2019   Lab Results  Component Value Date   ALBUMIN 3.7 02/16/2017   ANIONGAP 11 02/17/2017   Lab Results  Component Value Date   CHOL 235 (H) 12/19/2019   CHOL 177 05/24/2015   HDL 46 12/19/2019   HDL 33 (L) 05/24/2015   LDLCALC 160 (H) 12/19/2019   LDLCALC 127 (H) 05/24/2015   CHOLHDL 5.1 (H)  12/19/2019   CHOLHDL 5.4 05/24/2015   Lab Results  Component Value Date   TRIG 155 (H) 12/19/2019   Lab Results  Component Value Date   HGBA1C 5.5 05/24/2015      ASSESSMENT & PLAN:   Problem List Items Addressed This Visit      Cardiovascular and Mediastinum   Essential hypertension    Htn not at goal but stable, continue meds for now.       Relevant Medications   atorvastatin (LIPITOR) 40 MG tablet     Musculoskeletal and Integument   Abrasion of right hand    Multiple abrasions R hand, TDAP today      Relevant Orders   Tdap vaccine greater than or equal to 7yo IM (Completed)     Other   COPD suggested by initial evaluation ([place])    Pt states unsure of last Pulm Eval, he is requesting Oxygen evaluation from Pulmonology      Relevant Orders   Ambulatory referral to Pulmonology   Smoking    Smokes 1/2 ppd decreased from 2 packs.       Annual physical exam - Primary    PSA- Done- Mildly elevated, will repeat in 1 year Colonoscopy- Unsure last one TDAP- Doing this today.  ECG- Completed recently.  Hyperlipidemia    LDL elevated, increasing Lipitor today.       Relevant Medications   atorvastatin (LIPITOR) 40 MG tablet   RESOLVED: Moderate mixed hyperlipidemia not requiring statin therapy   Relevant Medications   atorvastatin (LIPITOR) 40 MG tablet    Other Visit Diagnoses    Screening for colon cancer       Relevant Orders   Ambulatory referral to Gastroenterology      Meds ordered this encounter  Medications  . atorvastatin (LIPITOR) 40 MG tablet    Sig: Take 0.5 tablets (20 mg total) by mouth daily.    Dispense:  90 tablet    Refill:  6      Follow-up: No follow-ups on file.    Beckie Salts, [place] [address] [place], [place] [address]

## 2019-12-26 NOTE — Assessment & Plan Note (Signed)
LDL elevated, increasing Lipitor today.

## 2019-12-26 NOTE — Assessment & Plan Note (Signed)
PSA- Done- Mildly elevated, will repeat in 1 year Colonoscopy- Unsure last one TDAP- Doing this today.  ECG- Completed recently.

## 2019-12-30 ENCOUNTER — Encounter: Payer: Self-pay | Admitting: *Deleted

## 2020-03-26 ENCOUNTER — Ambulatory Visit: Payer: Medicare HMO | Admitting: Family Medicine

## 2020-03-31 ENCOUNTER — Encounter: Payer: Self-pay | Admitting: Internal Medicine

## 2020-03-31 ENCOUNTER — Ambulatory Visit (INDEPENDENT_AMBULATORY_CARE_PROVIDER_SITE_OTHER): Payer: Medicare HMO | Admitting: Internal Medicine

## 2020-03-31 VITALS — BP 160/80 | HR 63 | Ht 73.0 in | Wt 245.1 lb

## 2020-03-31 DIAGNOSIS — J441 Chronic obstructive pulmonary disease with (acute) exacerbation: Secondary | ICD-10-CM

## 2020-03-31 DIAGNOSIS — I10 Essential (primary) hypertension: Secondary | ICD-10-CM | POA: Diagnosis not present

## 2020-03-31 DIAGNOSIS — I4891 Unspecified atrial fibrillation: Secondary | ICD-10-CM

## 2020-03-31 DIAGNOSIS — E785 Hyperlipidemia, unspecified: Secondary | ICD-10-CM | POA: Diagnosis not present

## 2020-03-31 DIAGNOSIS — I700 Atherosclerosis of aorta: Secondary | ICD-10-CM | POA: Diagnosis not present

## 2020-03-31 NOTE — Addendum Note (Signed)
Addended by: Alois Cliche on: 03/31/2020 11:34 AM   Modules accepted: Orders

## 2020-03-31 NOTE — Addendum Note (Signed)
Addended by: Lacretia Nicks L on: 03/31/2020 11:18 AM   Modules accepted: Orders

## 2020-03-31 NOTE — Assessment & Plan Note (Signed)
Patient was advised to continue taking her medicine regular basis. He denies any history of chest pain but complains of shortness of breath on exertion he was advised to lose weight.

## 2020-03-31 NOTE — Assessment & Plan Note (Signed)
.  -   I encouraged the patient to eat a low-sodium diet to help control blood pressure. - I encouraged the patient to live an active lifestyle and complete activities that increases heart rate to 85% target heart rate at least 5 times per week for one hour.     

## 2020-03-31 NOTE — Assessment & Plan Note (Signed)
Patient was advised to quit smoking 

## 2020-03-31 NOTE — Progress Notes (Signed)
Established Patient Office Visit  Subjective:  Patient ID: Gregory Lane, male    DOB: May 24, 1950  Age: 70 y.o. MRN: [id number]  CC:  Chief Complaint  Patient presents with  . Hypertension    Patient here for 3 month fu on BP. Patient's blood pressure is elevated today, patient states that he didn't take his BP meds.     Hypertension This is a chronic problem. The problem is resistant. Associated symptoms include anxiety, chest pain and shortness of breath. Pertinent negatives include no orthopnea. Risk factors for coronary artery disease include dyslipidemia and obesity. Hypertensive end-organ damage includes CAD/MI. There is no history of chronic renal disease or sleep apnea.    Gregory Lane presents for  check  Past Medical History:  Diagnosis Date  . Aortic atherosclerosis ([place]) 02/16/2017  . Arthritis   . Blind left eye   . Hypertension   . Low TSH level 02/17/2017  . Myocardial infarction ([place])   . Perforated duodenal ulcer ([place])   . Stroke [facility])     Past Surgical History:  Procedure Laterality Date  . EXPLORATORY LAPAROTOMY  05/12/2015  . EYE SURGERY    . FRACTURE SURGERY    . LAPAROTOMY N/A 05/10/2015   Procedure: EXPLORATORY LAPAROTOMY;  Surgeon: Ralene Ok, MD;  Location: [place];  Service: General;  Laterality: N/A;  . LEFT HEART CATH AND CORONARY ANGIOGRAPHY N/A 05/02/2016   Procedure: Left Heart Cath and Coronary Angiography;  Surgeon: Adrian Prows, MD;  Location: [place] [facility];  Service: Cardiovascular;  Laterality: N/A;    Family History  Problem Relation Age of Onset  . Cirrhosis Father   . Hypertension Mother   . Lung cancer Sister   . Hypertension Sister   . Arthritis Sister   . Heart murmur Sister     Social History   Socioeconomic History  . Marital status: Single    Spouse name: Not on file  . Number of children: Not on file  . Years of education: Not on file  . Highest education level: Not on file  Occupational History  .  Not on file  Tobacco Use  . Smoking status: Current Every Day Smoker    Packs/day: 1.00    Years: 60.00    Pack years: 60.00    Types: Cigarettes  . Smokeless tobacco: Never Used  Substance and Sexual Activity  . Alcohol use: No    Alcohol/week: 60.0 standard drinks    Types: 60 Standard drinks or equivalent per week    Comment: daily 12 pk of beer  . Drug use: No  . Sexual activity: Not on file  Other Topics Concern  . 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 Lives alone   Social Determinants of Health   Financial Resource Strain: Not on file  Food Insecurity: Not on file  Transportation Needs: Not on file  Physical Activity: Not on file  Stress: Not on file  Social Connections: Not on file  Intimate Partner Violence: Not on file     Current Outpatient Medications:  .  albuterol (PROVENTIL HFA;VENTOLIN HFA) 108 (90 Base) MCG/ACT inhaler, Inhale 2 puffs into the lungs every 6 (six) hours as needed for wheezing or shortness of breath., Disp: 1 Inhaler, Rfl: 1 .  amLODipine (NORVASC) 10 MG tablet, Take 1 tablet (10 mg total) by mouth daily., Disp: 90 tablet, Rfl: 3 .  atorvastatin (LIPITOR) 40 MG tablet, Take 0.5 tablets (20 mg total) by mouth  daily., Disp: 90 tablet, Rfl: 6 .  budesonide-formoterol (SYMBICORT) 160-4.5 MCG/ACT inhaler, TAKE 2 PUFFS BY MOUTH TWICE A DAY, Disp: 1 each, Rfl: 5 .  buPROPion (WELLBUTRIN XL) 150 MG 24 hr tablet, Take 2 tablets (300 mg total) by mouth daily., Disp: 90 tablet, Rfl: 1 .  diltiazem (CARDIZEM SR) 120 MG 12 hr capsule, TAKE 1 CAPSULE BY MOUTH EVERY 12 HOURS, Disp: 180 capsule, Rfl: 3 .  ELIQUIS 5 MG TABS tablet, TAKE 1 TABLET BY MOUTH TWICE A DAY, Disp: 60 tablet, Rfl: 6 .  FLUoxetine (PROZAC) 20 MG capsule, Take 40 mg by mouth daily., Disp: , Rfl:  .  furosemide (LASIX) 20 MG tablet, Take 1 tablet (20 mg total) by mouth daily., Disp: 90 tablet, Rfl: 3 .  isosorbide mononitrate (IMDUR) 60 MG 24 hr tablet, Take 60 mg by  mouth daily., Disp: , Rfl:  .  lisinopril (ZESTRIL) 40 MG tablet, TAKE 1 TABLET BY MOUTH EVERY DAY, Disp: 90 tablet, Rfl: 1 .  metoprolol tartrate (LOPRESSOR) 25 MG tablet, Take 0.5 tablets (12.5 mg total) by mouth 2 (two) times daily. (Patient taking differently: Take 25 mg by mouth 2 (two) times daily.), Disp: 15 tablet, Rfl: 0 .  pantoprazole (PROTONIX) 40 MG tablet, Take 1 tablet (40 mg total) by mouth daily., Disp: 90 tablet, Rfl: 3 .  potassium chloride SA (KLOR-CON) 20 MEQ tablet, Take 1 tablet (20 mEq total) by mouth 2 (two) times daily., Disp: 8 tablet, Rfl: 0 .  predniSONE (DELTASONE) 20 MG tablet, Take 2 tablets (40 mg total) by mouth daily with breakfast., Disp: 8 tablet, Rfl: 0   No Known Allergies  ROS Review of Systems  Constitutional: Positive for fatigue. Negative for appetite change.  HENT: Negative for nosebleeds and sinus pain.   Eyes: Negative for pain.  Respiratory: Positive for shortness of breath. Negative for choking.   Cardiovascular: Positive for chest pain. Negative for orthopnea.  Gastrointestinal: Negative for constipation.  Genitourinary: Negative for hematuria.  Musculoskeletal: Positive for arthralgias, back pain and gait problem.  Neurological: Positive for dizziness. Negative for seizures.  Psychiatric/Behavioral: Positive for behavioral problems and confusion.      Objective:    Physical Exam Vitals reviewed.  Constitutional:      Appearance: Normal appearance. He is obese.  HENT:     Mouth/Throat:     Mouth: Mucous membranes are moist.  Eyes:     Pupils: Pupils are equal, round, and reactive to light.  Neck:     Vascular: No carotid bruit.  Cardiovascular:     Rate and Rhythm: Normal rate. Rhythm irregular.     Pulses: Normal pulses.     Heart sounds: Normal heart sounds.  Pulmonary:     Effort: Pulmonary effort is normal.     Breath sounds: Normal breath sounds.  Abdominal:     General: Bowel sounds are normal.     Palpations:  Abdomen is soft. There is no hepatomegaly, splenomegaly or mass.     Tenderness: There is no abdominal tenderness.     Hernia: No hernia is present.  Musculoskeletal:        General: No swelling.     Cervical back: Neck supple.     Right lower leg: No edema.     Left lower leg: No edema.  Skin:    Findings: No rash.  Neurological:     Mental Status: He is alert and oriented to person, place, and time.     Motor: No weakness.  Psychiatric:        Mood and Affect: Mood normal.        Behavior: Behavior normal.     BP (!) 160/80   Pulse 63   Ht 6\' 1"  (1.854 m)   Wt 245 lb 1.6 oz (111.2 kg)   BMI 32.34 kg/m  Wt Readings from Last 3 Encounters:  03/31/20 245 lb 1.6 oz (111.2 kg)  12/26/19 237 lb 1.6 oz (107.5 kg)  11/11/19 236 lb 1.6 oz (107.1 kg)     Health Maintenance Due  Topic Date Due  . Hepatitis C Screening  Never done  . COLONOSCOPY (Pts 45-26yrs Insurance coverage will need to be confirmed)  Never done  . PNA vac Low Risk Adult (2 of 2 - PCV13) 05/14/2016  . INFLUENZA VACCINE  09/21/2019  . COVID-19 Vaccine (2 - Moderna 3-dose series) 10/27/2019    There are no preventive care reminders to display for this patient.  Lab Results  Component Value Date   TSH 1.78 12/19/2019   Lab Results  Component Value Date   WBC 7.3 12/19/2019   HGB 16.1 12/19/2019   HCT 45.2 12/19/2019   MCV 100.7 (H) 12/19/2019   PLT 243 12/19/2019   Lab Results  Component Value Date   NA 138 12/19/2019   K 4.3 12/19/2019   CO2 23 12/19/2019   GLUCOSE 103 (H) 12/19/2019   BUN 12 12/19/2019   CREATININE 0.91 12/19/2019   BILITOT 0.5 12/19/2019   ALKPHOS 71 02/16/2017   AST 13 12/19/2019   ALT 20 12/19/2019   PROT 7.4 12/19/2019   ALBUMIN 3.7 02/16/2017   CALCIUM 9.2 12/19/2019   ANIONGAP 11 02/17/2017   Lab Results  Component Value Date   CHOL 235 (H) 12/19/2019   Lab Results  Component Value Date   HDL 46 12/19/2019   Lab Results  Component Value Date   LDLCALC  160 (H) 12/19/2019   Lab Results  Component Value Date   TRIG 155 (H) 12/19/2019   Lab Results  Component Value Date   CHOLHDL 5.1 (H) 12/19/2019   Lab Results  Component Value Date   HGBA1C 5.5 05/24/2015      Assessment & Plan:   Problem List Items Addressed This Visit      Cardiovascular and Mediastinum   Essential hypertension     - I encouraged the patient to eat a low-sodium diet to help control blood pressure. - I encouraged the patient to live an active lifestyle and complete activities that increases heart rate to 85% target heart rate at least 5 times per week for one hour.          Aortic atherosclerosis ([place])    Patient was advised to continue taking her medicine regular basis. He denies any history of chest pain but complains of shortness of breath on exertion he was advised to lose weight.      : Atrial fibrillation with rapid ventricular response ([place]) - Primary    Patient was advised to take Eliquis and lose weight walk on a daily basis.        Respiratory       Patient was advised to quit smoking.        Other   Hyperlipidemia    - The patient's hyperlipidemia is not stable on statin. - The patient will continue the current treatment regimen.  - I encouraged the patient to eat more vegetables and whole wheat, and to avoid fatty foods like whole milk,  hard cheese, egg yolks, margarine, baked sweets, and fried foods.  - I encouraged the patient to live an active lifestyle and complete activities for 40 minutes at least three times per week.  - I instructed the patient to go to the ER if they begin having chest pain.           No orders of the defined types were placed in this encounter.   Follow-up: No follow-ups on file.    Cletis [place], MD

## 2020-03-31 NOTE — Assessment & Plan Note (Signed)
Patient was advised to take Eliquis and lose weight walk on a daily basis.

## 2020-03-31 NOTE — Addendum Note (Signed)
Addended by: Lacretia Nicks L on: 03/31/2020 11:20 AM   Modules accepted: Orders

## 2020-03-31 NOTE — Assessment & Plan Note (Signed)
-   The patient's hyperlipidemia is not stable on statin. - The patient will continue the current treatment regimen.  - I encouraged the patient to eat more vegetables and whole wheat, and to avoid fatty foods like whole milk, hard cheese, egg yolks, margarine, baked sweets, and fried foods.  - I encouraged the patient to live an active lifestyle and complete activities for 40 minutes at least three times per week.  - I instructed the patient to go to the ER if they begin having chest pain.

## 2020-03-31 NOTE — Addendum Note (Signed)
Addended by: Lacretia Nicks L on: 03/31/2020 11:35 AM   Modules accepted: Orders

## 2020-04-28 ENCOUNTER — Ambulatory Visit (INDEPENDENT_AMBULATORY_CARE_PROVIDER_SITE_OTHER): Payer: Medicare HMO | Admitting: Internal Medicine

## 2020-04-28 ENCOUNTER — Encounter: Payer: Self-pay | Admitting: Internal Medicine

## 2020-04-28 ENCOUNTER — Other Ambulatory Visit: Payer: Self-pay

## 2020-04-28 VITALS — BP 137/99 | HR 70 | Ht 73.0 in | Wt 233.7 lb

## 2020-04-28 DIAGNOSIS — R0602 Shortness of breath: Secondary | ICD-10-CM

## 2020-04-28 DIAGNOSIS — I4891 Unspecified atrial fibrillation: Secondary | ICD-10-CM | POA: Diagnosis not present

## 2020-04-28 DIAGNOSIS — J441 Chronic obstructive pulmonary disease with (acute) exacerbation: Secondary | ICD-10-CM

## 2020-04-28 DIAGNOSIS — E8881 Metabolic syndrome: Secondary | ICD-10-CM

## 2020-04-28 DIAGNOSIS — I10 Essential (primary) hypertension: Secondary | ICD-10-CM | POA: Diagnosis not present

## 2020-04-28 DIAGNOSIS — F17200 Nicotine dependence, unspecified, uncomplicated: Secondary | ICD-10-CM

## 2020-04-28 NOTE — Assessment & Plan Note (Signed)
Resolved

## 2020-04-28 NOTE — Assessment & Plan Note (Signed)
Patient blood pressure is normal patient denies any chest pain or shortness of breath there is no history of palpitation paroxysmal nocturnal dyspnea patient can walk 2 blocks without any problem patient was advised to follow low-salt low-cholesterol diet

## 2020-04-28 NOTE — Assessment & Plan Note (Signed)
-   I instructed the patient to stop smoking and provided them with smoking cessation materials.  - I informed the patient that smoking puts them at increased risk for cancer, COPD, hypertension, and more.  - Informed the patient to seek help if they begin to have trouble breathing, develop chest pain, start to cough up blood, feel faint, or pass out.  

## 2020-04-28 NOTE — Assessment & Plan Note (Signed)
.    Patient can only walk 60 feet because of shortness of breath.  He was advised to lose weight.  Patient was advised to quit smoking.

## 2020-04-28 NOTE — Assessment & Plan Note (Signed)
Patient is taking Eliquis for atrial fibrillation. 

## 2020-04-28 NOTE — Progress Notes (Signed)
Established Patient Office Visit  Subjective:  Patient ID: Gregory Lane, male    DOB: Oct 14, 1950  Age: 70 y.o. MRN: [id number]  CC:  Chief Complaint  Patient presents with  . Hypertension    HPI  Gregory Lane presents for Patient is known to have hypertension is also known to have irregular heartbeat and has several heart attack in the past.  He has a problem with exogenous obesity and COPD secondary to smoking in the past.  He was advised to quit smoking.  He does not have any productive cough.    Past Medical History:  Diagnosis Date  . Aortic atherosclerosis ([place]) 02/16/2017  . Arthritis   . Blind left eye   . Hypertension   . Low TSH level 02/17/2017  . Myocardial infarction ([place])   . Perforated duodenal ulcer ([place])   . Stroke [facility])     Past Surgical History:  Procedure Laterality Date  . EXPLORATORY LAPAROTOMY  05/12/2015  . EYE SURGERY    . FRACTURE SURGERY    . LAPAROTOMY N/A 05/10/2015   Procedure: EXPLORATORY LAPAROTOMY;  Surgeon: Ralene Ok, MD;  Location: [place];  Service: General;  Laterality: N/A;  . LEFT HEART CATH AND CORONARY ANGIOGRAPHY N/A 05/02/2016   Procedure: Left Heart Cath and Coronary Angiography;  Surgeon: Adrian Prows, MD;  Location: [place] [facility];  Service: Cardiovascular;  Laterality: N/A;    Family History  Problem Relation Age of Onset  . Cirrhosis Father   . Hypertension Mother   . Lung cancer Sister   . Hypertension Sister   . Arthritis Sister   . Heart murmur Sister     Social History   Socioeconomic History  . Marital status: Single    Spouse name: Not on file  . Number of children: Not on file  . Years of education: Not on file  . Highest education level: Not on file  Occupational History  . Not on file  Tobacco Use  . Smoking status: Current Every Day Smoker    Packs/day: 1.00    Years: 60.00    Pack years: 60.00    Types: Cigarettes  . Smokeless tobacco: Never Used  Substance and Sexual Activity  .  Alcohol use: No    Alcohol/week: 60.0 standard drinks    Types: 60 Standard drinks or equivalent per week    Comment: daily 12 pk of beer  . Drug use: No  . Sexual activity: Not on file  Other Topics Concern  . 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 Lives alone   Social Determinants of Health   Financial Resource Strain: Not on file  Food Insecurity: Not on file  Transportation Needs: Not on file  Physical Activity: Not on file  Stress: Not on file  Social Connections: Not on file  Intimate Partner Violence: Not on file     Current Outpatient Medications:  .  albuterol (PROVENTIL HFA;VENTOLIN HFA) 108 (90 Base) MCG/ACT inhaler, Inhale 2 puffs into the lungs every 6 (six) hours as needed for wheezing or shortness of breath., Disp: 1 Inhaler, Rfl: 1 .  atorvastatin (LIPITOR) 40 MG tablet, Take 0.5 tablets (20 mg total) by mouth daily., Disp: 90 tablet, Rfl: 6 .  budesonide-formoterol (SYMBICORT) 160-4.5 MCG/ACT inhaler, TAKE 2 PUFFS BY MOUTH TWICE A DAY, Disp: 1 each, Rfl: 5 .  buPROPion (WELLBUTRIN XL) 150 MG 24 hr tablet, Take 2 tablets (300 mg total) by mouth daily., Disp: 90 tablet,  Rfl: 1 .  diltiazem (CARDIZEM SR) 120 MG 12 hr capsule, TAKE 1 CAPSULE BY MOUTH EVERY 12 HOURS, Disp: 180 capsule, Rfl: 3 .  ELIQUIS 5 MG TABS tablet, TAKE 1 TABLET BY MOUTH TWICE A DAY, Disp: 60 tablet, Rfl: 6 .  FLUoxetine (PROZAC) 20 MG capsule, Take 40 mg by mouth daily., Disp: , Rfl:  .  furosemide (LASIX) 20 MG tablet, Take 1 tablet (20 mg total) by mouth daily., Disp: 90 tablet, Rfl: 3 .  isosorbide mononitrate (IMDUR) 60 MG 24 hr tablet, Take 60 mg by mouth daily., Disp: , Rfl:  .  lisinopril (ZESTRIL) 40 MG tablet, TAKE 1 TABLET BY MOUTH EVERY DAY, Disp: 90 tablet, Rfl: 1 .  metoprolol tartrate (LOPRESSOR) 25 MG tablet, Take 0.5 tablets (12.5 mg total) by mouth 2 (two) times daily. (Patient taking differently: Take 25 mg by mouth 2 (two) times daily.), Disp: 15 tablet, Rfl:  0 .  pantoprazole (PROTONIX) 40 MG tablet, Take 1 tablet (40 mg total) by mouth daily., Disp: 90 tablet, Rfl: 3 .  potassium chloride SA (KLOR-CON) 20 MEQ tablet, Take 1 tablet (20 mEq total) by mouth 2 (two) times daily., Disp: 8 tablet, Rfl: 0 .  predniSONE (DELTASONE) 20 MG tablet, Take 2 tablets (40 mg total) by mouth daily with breakfast., Disp: 8 tablet, Rfl: 0   No Known Allergies  ROS Review of Systems  Eyes: Negative.   Respiratory: Positive for cough and shortness of breath. Negative for chest tightness.   Gastrointestinal: Negative for blood in stool.  Musculoskeletal: Positive for back pain.  Neurological: Negative for speech difficulty.  Psychiatric/Behavioral: Negative for behavioral problems. The patient is not hyperactive.       Objective:    Physical Exam Constitutional:      Appearance: He is obese.  HENT:     Mouth/Throat:     Mouth: Mucous membranes are moist.  Cardiovascular:     Rate and Rhythm: Rhythm irregular.  Pulmonary:     Breath sounds: No wheezing or rhonchi.  Abdominal:     General: There is distension.  Musculoskeletal:     Cervical back: Normal range of motion.  Neurological:     General: No focal deficit present.     BP (!) 137/99   Pulse 70   Ht 6\' 1"  (1.854 m)   Wt 233 lb 11.2 oz (106 kg)   BMI 30.83 kg/m  Wt Readings from Last 3 Encounters:  04/28/20 233 lb 11.2 oz (106 kg)  03/31/20 245 lb 1.6 oz (111.2 kg)  12/26/19 237 lb 1.6 oz (107.5 kg)     Health Maintenance Due  Topic Date Due  . Hepatitis C Screening  Never done  . COLONOSCOPY (Pts 45-23yrs Insurance coverage will need to be confirmed)  Never done  . PNA vac Low Risk Adult (2 of 2 - PCV13) 05/14/2016  . INFLUENZA VACCINE  09/21/2019  . COVID-19 Vaccine (2 - Moderna 3-dose series) 10/27/2019    There are no preventive care reminders to display for this patient.  Lab Results  Component Value Date   TSH 1.78 12/19/2019   Lab Results  Component Value Date    WBC 7.3 12/19/2019   HGB 16.1 12/19/2019   HCT 45.2 12/19/2019   MCV 100.7 (H) 12/19/2019   PLT 243 12/19/2019   Lab Results  Component Value Date   NA 138 12/19/2019   K 4.3 12/19/2019   CO2 23 12/19/2019   GLUCOSE 103 (H) 12/19/2019  BUN 12 12/19/2019   CREATININE 0.91 12/19/2019   BILITOT 0.5 12/19/2019   ALKPHOS 71 02/16/2017   AST 13 12/19/2019   ALT 20 12/19/2019   PROT 7.4 12/19/2019   ALBUMIN 3.7 02/16/2017   CALCIUM 9.2 12/19/2019   ANIONGAP 11 02/17/2017   Lab Results  Component Value Date   CHOL 235 (H) 12/19/2019   Lab Results  Component Value Date   HDL 46 12/19/2019   Lab Results  Component Value Date   LDLCALC 160 (H) 12/19/2019   Lab Results  Component Value Date   TRIG 155 (H) 12/19/2019   Lab Results  Component Value Date   CHOLHDL 5.1 (H) 12/19/2019   Lab Results  Component Value Date   HGBA1C 5.5 05/24/2015      Assessment & Plan:   Problem List Items Addressed This Visit      Cardiovascular and Mediastinum   Essential hypertension    Patient blood pressure is normal patient denies any chest pain or shortness of breath there is no history of palpitation paroxysmal nocturnal dyspnea patient can walk 2 blocks without any problem patient was advised to follow low-salt low-cholesterol diet      RESOLVED: Atrial fibrillation with rapid ventricular response ([place]) - Primary    Patient is taking Eliquis for atrial fibrillation.        Respiratory   COPD with acute exacerbation (HCC)    Resolved        Other   Smoking    - I instructed the patient to stop smoking and provided them with smoking cessation materials.  - I informed the patient that smoking puts them at increased risk for cancer, COPD, hypertension, and more.  - Informed the patient to seek help if they begin to have trouble breathing, develop chest pain, start to cough up blood, feel faint, or pass out.      SOB (shortness of breath)    .  Patient can only walk 60  feet because of shortness of breath.  He was advised to lose weight.  Patient was advised to quit smoking.      Metabolic syndrome    - I encouraged the patient to lose weight.  - I educated them on making healthy dietary choices including eating more fruits and vegetables and less fried foods. - I encouraged the patient to exercise more, and educated on the benefits of exercise including weight loss, diabetes prevention, and hypertension prevention.           No orders of the defined types were placed in this encounter.   Follow-up: No follow-ups on file.    Cletis Athens, MD

## 2020-04-28 NOTE — Assessment & Plan Note (Signed)
-   I encouraged the patient to lose weight.  - I educated them on making healthy dietary choices including eating more fruits and vegetables and less fried foods. - I encouraged the patient to exercise more, and educated on the benefits of exercise including weight loss, diabetes prevention, and hypertension prevention.   

## 2020-08-02 ENCOUNTER — Encounter: Payer: Self-pay | Admitting: Internal Medicine

## 2020-08-02 ENCOUNTER — Ambulatory Visit (INDEPENDENT_AMBULATORY_CARE_PROVIDER_SITE_OTHER): Payer: Medicare HMO | Admitting: Internal Medicine

## 2020-08-02 ENCOUNTER — Other Ambulatory Visit: Payer: Self-pay

## 2020-08-02 ENCOUNTER — Emergency Department: Payer: Medicare HMO

## 2020-08-02 ENCOUNTER — Emergency Department
Admission: EM | Admit: 2020-08-02 | Discharge: 2020-08-02 | Disposition: A | Discharge status: Home / Self Care | Payer: Medicare HMO | Attending: Emergency Medicine | Admitting: Emergency Medicine

## 2020-08-02 VITALS — BP 146/56 | HR 86 | Ht 73.0 in | Wt 231.0 lb

## 2020-08-02 DIAGNOSIS — R001 Bradycardia, unspecified: Secondary | ICD-10-CM | POA: Diagnosis not present

## 2020-08-02 DIAGNOSIS — I700 Atherosclerosis of aorta: Secondary | ICD-10-CM

## 2020-08-02 DIAGNOSIS — F17210 Nicotine dependence, cigarettes, uncomplicated: Secondary | ICD-10-CM | POA: Diagnosis not present

## 2020-08-02 DIAGNOSIS — S0990XA Unspecified injury of head, initial encounter: Secondary | ICD-10-CM | POA: Insufficient documentation

## 2020-08-02 DIAGNOSIS — R0602 Shortness of breath: Secondary | ICD-10-CM

## 2020-08-02 DIAGNOSIS — Z79899 Other long term (current) drug therapy: Secondary | ICD-10-CM | POA: Diagnosis not present

## 2020-08-02 DIAGNOSIS — J449 Chronic obstructive pulmonary disease, unspecified: Secondary | ICD-10-CM | POA: Insufficient documentation

## 2020-08-02 DIAGNOSIS — I10 Essential (primary) hypertension: Secondary | ICD-10-CM | POA: Diagnosis not present

## 2020-08-02 DIAGNOSIS — W1839XA Other fall on same level, initial encounter: Secondary | ICD-10-CM | POA: Insufficient documentation

## 2020-08-02 DIAGNOSIS — R55 Syncope and collapse: Secondary | ICD-10-CM | POA: Diagnosis not present

## 2020-08-02 DIAGNOSIS — E8881 Metabolic syndrome: Secondary | ICD-10-CM

## 2020-08-02 DIAGNOSIS — Z7901 Long term (current) use of anticoagulants: Secondary | ICD-10-CM | POA: Insufficient documentation

## 2020-08-02 DIAGNOSIS — Z7951 Long term (current) use of inhaled steroids: Secondary | ICD-10-CM | POA: Insufficient documentation

## 2020-08-02 LAB — URINALYSIS, COMPLETE (UACMP) WITH MICROSCOPIC
Bilirubin Urine: NEGATIVE
Glucose, UA: NEGATIVE mg/dL
Hgb urine dipstick: NEGATIVE
Ketones, ur: 5 mg/dL — AB
Nitrite: NEGATIVE
Protein, ur: NEGATIVE mg/dL
Specific Gravity, Urine: 1.019 (ref 1.005–1.030)
pH: 5 (ref 5.0–8.0)

## 2020-08-02 LAB — BASIC METABOLIC PANEL
Anion gap: 7 (ref 5–15)
BUN: 13 mg/dL (ref 8–23)
CO2: 25 mmol/L (ref 22–32)
Calcium: 8.9 mg/dL (ref 8.9–10.3)
Chloride: 103 mmol/L (ref 98–111)
Creatinine, Ser: 0.85 mg/dL (ref 0.61–1.24)
GFR, Estimated: >60 mL/min (ref >60)
Glucose, Bld: 100 mg/dL — ABNORMAL HIGH (ref 70–99)
Potassium: 4 mmol/L (ref 3.5–5.1)
Sodium: 135 mmol/L (ref 135–145)

## 2020-08-02 LAB — CBC
HCT: 45.4 % (ref 39.0–52.0)
Hemoglobin: 15.6 g/dL (ref 13.0–17.0)
MCH: 34 pg (ref 26.0–34.0)
MCHC: 34.4 g/dL (ref 30.0–36.0)
MCV: 98.9 fL (ref 80.0–100.0)
Platelets: 216 10*3/uL (ref 150–400)
RBC: 4.59 MIL/uL (ref 4.22–5.81)
RDW: 13.6 % (ref 11.5–15.5)
WBC: 7.5 10*3/uL (ref 4.0–10.5)
nRBC: 0 % (ref 0.0–0.2)

## 2020-08-02 LAB — TROPONIN I (HIGH SENSITIVITY): Troponin I (High Sensitivity): 4 ng/L (ref <18)

## 2020-08-02 NOTE — Progress Notes (Signed)
Established Patient Office Visit  Subjective:  Patient ID: Gregory Lane, male    DOB: 11/23/1950  Age: 70 y.o. MRN: [id number]  CC:  Chief Complaint  Patient presents with   Follow-up    Loss of Consciousness This is a new problem. The current episode started in the past 7 days. The problem has been gradually improving. The symptoms are aggravated by normal activity. Associated symptoms include dizziness and headaches. Pertinent negatives include no abdominal pain, auditory change, bladder incontinence, chest pain, nausea, palpitations, slurred speech, visual change or vomiting. His past medical history is significant for arrhythmia, CAD and a clotting disorder. There is no history of seizures.   Jacob Cicero Boyko presents for    Past Medical History:  Diagnosis Date   Aortic atherosclerosis ([place]) 02/16/2017   Arthritis    Blind left eye    Hypertension    Low TSH level 02/17/2017   Myocardial infarction [facility])    Perforated duodenal ulcer ([place])    Stroke [facility])     Past Surgical History:  Procedure Laterality Date   EXPLORATORY LAPAROTOMY  05/12/2015   EYE SURGERY     FRACTURE SURGERY     LAPAROTOMY N/A 05/10/2015   Procedure: EXPLORATORY LAPAROTOMY;  Surgeon: Ralene Ok, MD;  Location: [place];  Service: General;  Laterality: N/A;   LEFT HEART CATH AND CORONARY ANGIOGRAPHY N/A 05/02/2016   Procedure: Left Heart Cath and Coronary Angiography;  Surgeon: Adrian Prows, MD;  Location: [place] [facility];  Service: Cardiovascular;  Laterality: N/A;    Family History  Problem Relation Age of Onset   Cirrhosis Father    Hypertension Mother    Lung cancer Sister    Hypertension Sister    Arthritis Sister    Heart murmur Sister     Social History   Socioeconomic History   Marital status: Single    Spouse name: Not on file   Number of children: Not on file   Years of education: Not on file   Highest education level: Not on file  Occupational History   Not on file   Tobacco Use   Smoking status: Every Day    Packs/day: 1.00    Years: 60.00    Pack years: 60.00    Types: Cigarettes   Smokeless tobacco: Never  Substance and Sexual Activity   Alcohol use: No    Alcohol/week: 60.0 standard drinks    Types: 60 Standard drinks or equivalent per week    Comment: daily 12 pk of beer   Drug use: No   Sexual activity: Not on file  Other Topics Concern   Not on file  Social History Narrative   [place] Veteran. Lives alone   Social Determinants of Health   Financial Resource Strain: Not on file  Food Insecurity: Not on file  Transportation Needs: Not on file  Physical Activity: Not on file  Stress: Not on file  Social Connections: Not on file  Intimate Partner Violence: Not on file     Current Outpatient Medications:    albuterol (PROVENTIL HFA;VENTOLIN HFA) 108 (90 Base) MCG/ACT inhaler, Inhale 2 puffs into the lungs every 6 (six) hours as needed for wheezing or shortness of breath., Disp: 1 Inhaler, Rfl: 1   atorvastatin (LIPITOR) 40 MG tablet, Take 0.5 tablets (20 mg total) by mouth daily., Disp: 90 tablet, Rfl: 6   budesonide-formoterol (SYMBICORT) 160-4.5 MCG/ACT inhaler, TAKE 2 PUFFS BY MOUTH TWICE A DAY, Disp: 1 each, Rfl: 5  buPROPion (WELLBUTRIN XL) 150 MG 24 hr tablet, Take 2 tablets (300 mg total) by mouth daily., Disp: 90 tablet, Rfl: 1   diltiazem (CARDIZEM SR) 120 MG 12 hr capsule, TAKE 1 CAPSULE BY MOUTH EVERY 12 HOURS, Disp: 180 capsule, Rfl: 3   ELIQUIS 5 MG TABS tablet, TAKE 1 TABLET BY MOUTH TWICE A DAY, Disp: 60 tablet, Rfl: 6   FLUoxetine (PROZAC) 20 MG capsule, Take 40 mg by mouth daily., Disp: , Rfl:    furosemide (LASIX) 20 MG tablet, Take 1 tablet (20 mg total) by mouth daily., Disp: 90 tablet, Rfl: 3   isosorbide mononitrate (IMDUR) 60 MG 24 hr tablet, Take 60 mg by mouth daily., Disp: , Rfl:    lisinopril (ZESTRIL) 40 MG tablet, TAKE 1 TABLET BY MOUTH EVERY DAY, Disp: 90 tablet, Rfl: 1   metoprolol tartrate (LOPRESSOR)  25 MG tablet, Take 0.5 tablets (12.5 mg total) by mouth 2 (two) times daily. (Patient taking differently: Take 25 mg by mouth 2 (two) times daily.), Disp: 15 tablet, Rfl: 0   pantoprazole (PROTONIX) 40 MG tablet, Take 1 tablet (40 mg total) by mouth daily., Disp: 90 tablet, Rfl: 3   potassium chloride SA (KLOR-CON) 20 MEQ tablet, Take 1 tablet (20 mEq total) by mouth 2 (two) times daily., Disp: 8 tablet, Rfl: 0   predniSONE (DELTASONE) 20 MG tablet, Take 2 tablets (40 mg total) by mouth daily with breakfast., Disp: 8 tablet, Rfl: 0   No Known Allergies  ROS Review of Systems  Cardiovascular:  Positive for syncope. Negative for chest pain and palpitations.  Gastrointestinal:  Negative for abdominal pain, nausea and vomiting.  Genitourinary:  Negative for bladder incontinence.  Neurological:  Positive for dizziness and headaches.     Objective:    Physical Exam HENT:     Head:     Comments: Patient has a bruise on the front of the head    Nose: Nose normal.     Mouth/Throat:     Mouth: Mucous membranes are moist.  Cardiovascular:     Rate and Rhythm: Bradycardia present.     Heart sounds: No murmur heard. Pulmonary:     Effort: No respiratory distress.     Breath sounds: No wheezing or rales.  Abdominal:     Tenderness: There is no abdominal tenderness. There is no guarding.  Musculoskeletal:        General: No swelling. Normal range of motion.  Skin:    General: Skin is warm.     Coloration: Skin is not jaundiced.     Findings: Bruising and lesion present.  Neurological:     General: No focal deficit present.     Mental Status: He is alert. Mental status is at baseline.     Cranial Nerves: No cranial nerve deficit.     Motor: No weakness.     Gait: Gait abnormal.   Patient has bruises on both knees. BP (!) 146/56   Pulse 86   Ht 6\' 1"  (1.854 m)   Wt 231 lb (104.8 kg)   BMI 30.48 kg/m  Wt Readings from Last 3 Encounters:  08/02/20 231 lb (104.8 kg)  04/28/20 233 lb  11.2 oz (106 kg)  03/31/20 245 lb 1.6 oz (111.2 kg)     Health Maintenance Due  Topic Date Due   Hepatitis C Screening  Never done   COLONOSCOPY (Pts 45-60yrs Insurance coverage will need to be confirmed)  Never done   Zoster Vaccines- Shingrix (1 of  2) Never done   PNA vac Low Risk Adult (2 of 2 - PCV13) 05/14/2016   COVID-19 Vaccine (2 - Moderna series) 10/27/2019    There are no preventive care reminders to display for this patient.  Lab Results  Component Value Date   TSH 1.78 12/19/2019   Lab Results  Component Value Date   WBC 7.5 08/02/2020   HGB 15.6 08/02/2020   HCT 45.4 08/02/2020   MCV 98.9 08/02/2020   PLT 216 08/02/2020   Lab Results  Component Value Date   NA 135 08/02/2020   K 4.0 08/02/2020   CO2 25 08/02/2020   GLUCOSE 100 (H) 08/02/2020   BUN 13 08/02/2020   CREATININE 0.85 08/02/2020   BILITOT 0.5 12/19/2019   ALKPHOS 71 02/16/2017   AST 13 12/19/2019   ALT 20 12/19/2019   PROT 7.4 12/19/2019   ALBUMIN 3.7 02/16/2017   CALCIUM 8.9 08/02/2020   ANIONGAP 7 08/02/2020   Lab Results  Component Value Date   CHOL 235 (H) 12/19/2019   Lab Results  Component Value Date   HDL 46 12/19/2019   Lab Results  Component Value Date   LDLCALC 160 (H) 12/19/2019   Lab Results  Component Value Date   TRIG 155 (H) 12/19/2019   Lab Results  Component Value Date   CHOLHDL 5.1 (H) 12/19/2019   Lab Results  Component Value Date   HGBA1C 5.5 05/24/2015      Assessment & Plan:   Problem List Items Addressed This Visit       Cardiovascular and Mediastinum   Essential hypertension    Stable at the present time       Aortic atherosclerosis ([place])    Hypercholesterolemia  I advised the patient to follow Mediterranean diet This diet is rich in fruits vegetables and whole grain, and This diet is also rich in fish and lean meat Patient should also eat a handful of almonds or walnuts daily Recent heart study indicated that average follow-up on  this kind of diet reduces the cardiovascular mortality by 50 to 70%==         Other   COPD suggested by initial evaluation ([place])    Patient advised to quit smoking completely       SOB (shortness of breath) - Primary    Patient was advised to lose weight and walk on a daily basis       Relevant Orders   EKG 12-Lead   Cigarette smoker    - I instructed the patient to stop smoking and provided them with smoking cessation materials.  - I informed the patient that smoking puts them at increased risk for cancer, COPD, hypertension, and more.  - Informed the patient to seek help if they begin to have trouble breathing, develop chest pain, start to cough up blood, feel faint, or pass out.       Metabolic syndrome    Patient has been advised to lose weight.       Vasovagal syncope    Patient has a l syncope about 4 to 5 days ago.  He has an abnormal EKG with evidence of old anteroseptal myocardial infarction He also sustained head injury . His gait is slightly abnormal, we will send him to the ER for further evaluation and work-up he will need a CT scan, cardiac enzyme, CBC and metabolic panel,       Report of the electrocardiogram. Electrocardiogram revealed sinus bradycardia poor R wave progression V1 V2 and  V3 ST-T abnormalities are suggestive of old anteroseptal myocardial infarction.    Follow-up: 1 wk   Cletis Athens, MD

## 2020-08-02 NOTE — Assessment & Plan Note (Signed)
Hypercholesterolemia  I advised the patient to follow Mediterranean diet This diet is rich in fruits vegetables and whole grain, and This diet is also rich in fish and lean meat Patient should also eat a handful of almonds or walnuts daily Recent heart study indicated that average follow-up on this kind of diet reduces the cardiovascular mortality by 50 to 70%== 

## 2020-08-02 NOTE — Discharge Instructions (Addendum)
Cardiology has scheduled you an appointment for Thursday at noon.  Please call their office tomorrow just to confirm this time.  Return to the ER if you develop return of your symptoms, passing out, chest pain, shortness of breath or any other concerns  IMPRESSION: No acute or subacute abnormality. Atrophy encephalomalacia of the right frontal lobe and right temporal tip, usually relating to a history of distant closed head injury.

## 2020-08-02 NOTE — Assessment & Plan Note (Signed)
Patient has a l syncope about 4 to 5 days ago.  He has an abnormal EKG with evidence of old anteroseptal myocardial infarction He also sustained head injury . His gait is slightly abnormal, we will send him to the ER for further evaluation and work-up he will need a CT scan, cardiac enzyme, CBC and metabolic panel,

## 2020-08-02 NOTE — Patient Instructions (Signed)
Syncope Syncope is when you pass out (faint) for a short time. It is caused by a sudden decrease in blood flow to the brain. Signs that you may be about to pass out include: Feeling dizzy or light-headed. Feeling sick to your stomach (nauseous). Seeing all white or all black. Having cold, clammy skin. If you pass out, get help right away. Call your local emergency services (911 in the [place] Do not drive yourself to the hospital. Follow these instructions at home: Watch for any changes in your symptoms. Take these actions to stay safe andhelp with your symptoms: Lifestyle Do not drive, use machinery, or play sports until your doctor says it is okay. Do not drink alcohol. Do not use any products that contain nicotine or tobacco, such as cigarettes and e-cigarettes. If you need help quitting, ask your doctor. Drink enough fluid to keep your pee (urine) pale yellow. General instructions Take over-the-counter and prescription medicines only as told by your doctor. If you are taking blood pressure or heart medicine, sit up and stand up slowly. Spend a few minutes getting ready to sit and then stand. This can help you feel less dizzy. Have someone stay with you until you feel stable. If you start to feel like you might pass out, lie down right away and raise (elevate) your feet above the level of your heart. Breathe deeply and steadily. Wait until all of the symptoms are gone. Keep all follow-up visits as told by your doctor. This is important. Get help right away if: You have a very bad headache. You pass out once or more than once. You have pain in your chest, belly, or back. You have a very fast or uneven heartbeat (palpitations). It hurts to breathe. You are bleeding from your mouth or your bottom (rectum). You have black or tarry poop (stool). You have jerky movements that you cannot control (seizure). You are confused. You have trouble walking. You are very weak. You have vision  problems. These symptoms may be an emergency. Do not wait to see if the symptoms will go away. Get medical help right away. Call your local emergency services (911 in the [place] Do not drive yourself to the hospital. Summary Syncope is when you pass out (faint) for a short time. It is caused by a sudden decrease in blood flow to the brain. Signs that you may be about to faint include feeling dizzy, light-headed, or sick to your stomach, seeing all white or all black, or having cold, clammy skin. If you start to feel like you might pass out, lie down right away and raise (elevate) your feet above the level of your heart. Breathe deeply and steadily. Wait until all of the symptoms are gone. This information is not intended to replace advice given to you by your health care provider. Make sure you discuss any questions you have with your healthcare provider. Document Revised: 03/19/2019 Document Reviewed: 03/21/2017 Elsevier Patient Education  2022 [employer].

## 2020-08-02 NOTE — Assessment & Plan Note (Signed)
Stable at the present time. 

## 2020-08-02 NOTE — Assessment & Plan Note (Signed)
Patient was advised to lose weight and walk on a daily basis

## 2020-08-02 NOTE — ED Provider Notes (Signed)
[facility] Emergency Department Provider Note  ____________________________________________   Event Date/Time   First MD Initiated Contact with Patient 08/02/20 1254     (approximate)  I have reviewed the triage vital signs and the nursing notes.   HISTORY  Chief Complaint Loss of Consciousness    HPI Gregory Lane is a 70 y.o. male with hypertension, MI, A. fib on Eliquis and metoprolol? who comes in with concerns for loss of consciousness.  Patient states about 5 days ago he had a syncopal episodes.  He states that he was just getting there and then went out.  He did hit his head.  He was not seen for it.  He denies any chest pain, shortness of breath, lightheadedness, nausea, vomiting, severe headaches prior to falling down.  Since that he has been ambulatory and has had no other symptoms.  Denies any neck pain.  He states that he went to his primary care doctor today who noticed that his heart rates were low and wanted him to come to the ER to make sure there is no signs of a heart attack.  He does report a little bit of pain where he hit his head  Patient states he is not sure if he is taking any metoprolol.          Past Medical History:  Diagnosis Date   Aortic atherosclerosis ([place]) 02/16/2017   Arthritis    Blind left eye    Hypertension    Low TSH level 02/17/2017   Myocardial infarction [facility])    Perforated duodenal ulcer ([place])    Stroke [facility])     Patient Active Problem List   Diagnosis Date Noted   Annual physical exam 12/26/2019   Hyperlipidemia 12/26/2019   Abrasion of right hand 62/37/6283   Metabolic syndrome 15/17/6160   Low TSH level 02/17/2017   Atrial flutter with rapid ventricular response ([place]) 02/16/2017   Cigarette smoker 02/16/2017   COPD with acute exacerbation ([place]) 02/16/2017   Elevated lactic acid level 02/16/2017   Aortic atherosclerosis ([place]) 02/16/2017   Productive cough    Bilateral edema of lower  extremity    Hypoxia 05/24/2015   SOB (shortness of breath) 05/24/2015   COPD suggested by initial evaluation ([place]) 05/17/2015   Essential hypertension 05/17/2015   Smoking 05/17/2015    Past Surgical History:  Procedure Laterality Date   EXPLORATORY LAPAROTOMY  05/12/2015   EYE SURGERY     FRACTURE SURGERY     LAPAROTOMY N/A 05/10/2015   Procedure: EXPLORATORY LAPAROTOMY;  Surgeon: Ralene Ok, MD;  Location: [place];  Service: General;  Laterality: N/A;   LEFT HEART CATH AND CORONARY ANGIOGRAPHY N/A 05/02/2016   Procedure: Left Heart Cath and Coronary Angiography;  Surgeon: Adrian Prows, MD;  Location: [facility];  Service: Cardiovascular;  Laterality: N/A;    Prior to Admission medications   Medication Sig Start Date End Date Taking? Authorizing Provider  albuterol (PROVENTIL HFA;VENTOLIN HFA) 108 (90 Base) MCG/ACT inhaler Inhale 2 puffs into the lungs every 6 (six) hours as needed for wheezing or shortness of breath. 05/18/15   Earnstine Regal, PA-C  atorvastatin (LIPITOR) 40 MG tablet Take 0.5 tablets (20 mg total) by mouth daily. 12/26/19   Beckie Salts, FNP  budesonide-formoterol (SYMBICORT) 160-4.5 MCG/ACT inhaler TAKE 2 PUFFS BY MOUTH TWICE A DAY 09/29/19   Cletis Athens, MD  buPROPion (WELLBUTRIN XL) 150 MG 24 hr tablet Take 2 tablets (300 mg total) by mouth daily. 09/29/19  Cletis Athens, MD  diltiazem (CARDIZEM SR) 120 MG 12 hr capsule TAKE 1 CAPSULE BY MOUTH EVERY 12 HOURS 12/04/19   Masoud, Viann Shove, MD  ELIQUIS 5 MG TABS tablet TAKE 1 TABLET BY MOUTH TWICE A DAY 10/14/19   Cletis Athens, MD  FLUoxetine (PROZAC) 20 MG capsule Take 40 mg by mouth daily.    [provider]  furosemide (LASIX) 20 MG tablet Take 1 tablet (20 mg total) by mouth daily. 11/27/19   Cletis Athens, MD  isosorbide mononitrate (IMDUR) 60 MG 24 hr tablet Take 60 mg by mouth daily.    [provider]  lisinopril (ZESTRIL) 40 MG tablet TAKE 1 TABLET BY MOUTH EVERY DAY 09/29/19   Cletis Athens, MD  metoprolol tartrate (LOPRESSOR) 25 MG tablet Take 0.5 tablets (12.5 mg total) by mouth 2 (two) times daily. Patient taking differently: Take 25 mg by mouth 2 (two) times daily. 05/18/15   Earnstine Regal, PA-C  pantoprazole (PROTONIX) 40 MG tablet Take 1 tablet (40 mg total) by mouth daily. 12/04/19   Cletis Athens, MD  potassium chloride SA (KLOR-CON) 20 MEQ tablet Take 1 tablet (20 mEq total) by mouth 2 (two) times daily. 09/29/19   Cletis Athens, MD  predniSONE (DELTASONE) 20 MG tablet Take 2 tablets (40 mg total) by mouth daily with breakfast. 02/18/17   Samuella Cota, MD    Allergies Patient has no known allergies.  Family History  Problem Relation Age of Onset   Cirrhosis Father    Hypertension Mother    Lung cancer Sister    Hypertension Sister    Arthritis Sister    Heart murmur Sister     Social History Social History   Tobacco Use   Smoking status: Every Day    Packs/day: 1.00    Years: 60.00    Pack years: 60.00    Types: Cigarettes   Smokeless tobacco: Never  Substance Use Topics   Alcohol use: No    Alcohol/week: 60.0 standard drinks    Types: 60 Standard drinks or equivalent per week    Comment: daily 12 pk of beer   Drug use: No      Review of Systems Constitutional: No fever/chills, fall Eyes: No visual changes. ENT: No sore throat. Cardiovascular: Denies chest pain.  Syncope Respiratory: Denies shortness of breath. Gastrointestinal: No abdominal pain.  No nausea, no vomiting.  No diarrhea.  No constipation. Genitourinary: Negative for dysuria. Musculoskeletal: Negative for back pain. Skin: Negative for rash. Neurological: Negative for headaches, focal weakness or numbness. All other ROS negative ____________________________________________   PHYSICAL EXAM:  VITAL SIGNS: ED Triage Vitals [08/02/20 1228]  Enc Vitals Group     BP (!) 151/67     Pulse Rate (!) 53     Resp 18     Temp 98 F (36.7 C)     Temp src      SpO2  100 %     Weight      Height      Head Circumference      Peak Flow      Pain Score      Pain Loc      Pain Edu?      Excl. in [place]?     Constitutional: Alert and oriented. Well appearing and in no acute distress. Eyes: Conjunctivae are normal. EOMI. Head: Atraumatic. Nose: No congestion/rhinnorhea. Mouth/Throat: Mucous membranes are moist.   Neck: No stridor. Trachea Midline. FROM Cardiovascular: Bradycardic regular rhythm. Grossly normal heart  sounds.  Good peripheral circulation. Respiratory: Normal respiratory effort.  No retractions. Lungs CTAB. Gastrointestinal: Soft and nontender. No distention. No abdominal bruits.  Musculoskeletal: No lower extremity tenderness nor edema.  No joint effusions. Neurologic:  Normal speech and language. No gross focal neurologic deficits are appreciated.  Skin:  Skin is warm, dry and intact. No rash noted. Psychiatric: Mood and affect are normal. Speech and behavior are normal. GU: Deferred   ____________________________________________   LABS (all labs ordered are listed, but only abnormal results are displayed)  Labs Reviewed  BASIC METABOLIC PANEL - Abnormal; Notable for the following components:      Result Value   Glucose, Bld 100 (*)    All other components within normal limits  CBC  URINALYSIS, COMPLETE (UACMP) WITH MICROSCOPIC  CBG MONITORING, ED  TROPONIN I (HIGH SENSITIVITY)   ____________________________________________   ED ECG REPORT I, Vanessa , the attending physician, personally viewed and interpreted this ECG.  Sinus bradycardia rate of 53, no ST elevation, no T wave inversions, normal intervals.  Read as junctional but there is a lot of artifacts is difficult to tell  Repeat EKG is sinus rate of 47, no ST elevation, no T wave versions, normal intervals ____________________________________________  RADIOLOGY Robert Bellow, personally viewed and evaluated these images (plain radiographs) as part of my  medical decision making, as well as reviewing the written report by the radiologist.  ED MD interpretation: No pneumonia  Official radiology report(s): DG Chest 2 View  Result Date: 08/02/2020 CLINICAL DATA:  Syncope EXAM: CHEST - 2 VIEW COMPARISON:  02/16/2017 FINDINGS: Pulmonary hyperinflation compatible with COPD. Lungs are clear without infiltrate effusion or mass. Heart size and vascularity normal. IMPRESSION: No active cardiopulmonary disease. Electronically Signed   By: Franchot Gallo M.D.   On: 08/02/2020 13:41   CT HEAD WO CONTRAST  Result Date: 08/02/2020 CLINICAL DATA:  Syncopal episode with trauma to the head. EXAM: CT HEAD WITHOUT CONTRAST TECHNIQUE: Contiguous axial images were obtained from the base of the skull through the vertex without intravenous contrast. COMPARISON:  None. FINDINGS: Brain: Generalized atrophy. No focal finding affects the brainstem or cerebellum. On the right, there is chronic atrophy and encephalomalacia of the frontal lobe and the right temporal tip, a pattern usually indicating distant closed head injury. No sign of acute infarction, mass lesion, hemorrhage, hydrocephalus or extra-axial collection. Vascular: There is atherosclerotic calcification of the major vessels at the base of the brain. Skull: Normal Sinuses/Orbits: Clear/normal Other: None IMPRESSION: No acute or subacute abnormality. Atrophy encephalomalacia of the right frontal lobe and right temporal tip, usually relating to a history of distant closed head injury. Electronically Signed   By: Nelson Chimes M.D.   On: 08/02/2020 13:00    ____________________________________________   PROCEDURES  Procedure(s) performed (including Critical Care):  Procedures   ____________________________________________   INITIAL IMPRESSION / ASSESSMENT AND PLAN / ED COURSE  Kellar Westberg Poage was evaluated in Emergency Department on 08/02/2020 for the symptoms described in the history of present illness. He  was evaluated in the context of the global COVID-19 pandemic, which necessitated consideration that the patient might be at risk for infection with the SARS-CoV-2 virus that causes COVID-19. Institutional protocols and algorithms that pertain to the evaluation of patients at risk for COVID-19 are in a state of rapid change based on information released by regulatory bodies including the CDC and federal and state organizations. These policies and algorithms were followed during the patient's care in the  ED.    Patient is a 70 year old who came in with concerns for abnormal EKG.  Discussed with the primary doctor who was concerned that he had a little ST elevation in a V1 and's poor R waves in V2 V3.  Repeat EKGs here show sinus bradycardia x2.  Labs ordered to evaluate for Electra abnormalities, AKI, ACS.  Patient denies any symptoms at this time.  CT scan was also ordered due to to him being on a blood thinner to make sure no evidence of intracranial hemorrhage.  This shows some old evidence of trauma which patient states that he has had prior head injuries.  Labs are reassuring.  Patient remains asymptomatic.  We discussed admission versus going home for syncopal work-up but patient's adamant that he would like to go home.  He does not have a cardiologist.  Discussed with Dr. Ubaldo Glassing who can see him on Thursday at noon.  We discussed return precautions.  He is not sure what medications he is on but given he is asymptomatic from his bradycardia we will not adjust them.  Instructed him to take his medications to the cardiologist.  I discussed the provisional nature of ED diagnosis, the treatment so far, the ongoing plan of care, follow up appointments and return precautions with the patient and any family or support people present. They expressed understanding and agreed with the plan, discharged home.  ____________________________________________   FINAL CLINICAL IMPRESSION(S) / ED DIAGNOSES   Final  diagnoses:  Syncope and collapse  Traumatic injury of head, initial encounter  Sinus bradycardia      MEDICATIONS GIVEN DURING THIS VISIT:  Medications - No data to display   ED Discharge Orders     None        Note:  This document was prepared using Dragon voice recognition software and may include unintentional dictation errors.    Vanessa [place], MD 08/02/20 1434

## 2020-08-02 NOTE — ED Triage Notes (Signed)
Pt sent from PCP with possible MI, states he was getting into his truck 5 days ago and had a syncopal episode, states he had changes to his ECG that is concerning for MI. Pt denies any chest pain. Pt is ambulatory with a steady gait.

## 2020-08-02 NOTE — Assessment & Plan Note (Signed)
Patient advised to quit smoking completely

## 2020-08-02 NOTE — ED Notes (Signed)
ED Provider at bedside. 

## 2020-08-02 NOTE — Assessment & Plan Note (Signed)
Patient has been advised to lose weight.

## 2020-08-02 NOTE — Assessment & Plan Note (Signed)
-   I instructed the patient to stop smoking and provided them with smoking cessation materials.  - I informed the patient that smoking puts them at increased risk for cancer, COPD, hypertension, and more.  - Informed the patient to seek help if they begin to have trouble breathing, develop chest pain, start to cough up blood, feel faint, or pass out.  

## 2020-08-04 ENCOUNTER — Other Ambulatory Visit: Payer: Self-pay | Admitting: Internal Medicine

## 2020-08-05 DIAGNOSIS — E782 Mixed hyperlipidemia: Secondary | ICD-10-CM | POA: Diagnosis not present

## 2020-08-05 DIAGNOSIS — J441 Chronic obstructive pulmonary disease with (acute) exacerbation: Secondary | ICD-10-CM | POA: Diagnosis not present

## 2020-08-05 DIAGNOSIS — I700 Atherosclerosis of aorta: Secondary | ICD-10-CM | POA: Diagnosis not present

## 2020-08-05 DIAGNOSIS — I10 Essential (primary) hypertension: Secondary | ICD-10-CM | POA: Diagnosis not present

## 2020-08-05 DIAGNOSIS — R55 Syncope and collapse: Secondary | ICD-10-CM | POA: Diagnosis not present

## 2020-08-05 DIAGNOSIS — R600 Localized edema: Secondary | ICD-10-CM | POA: Diagnosis present

## 2020-08-05 DIAGNOSIS — I4892 Unspecified atrial flutter: Secondary | ICD-10-CM | POA: Diagnosis not present

## 2020-08-05 DIAGNOSIS — R0602 Shortness of breath: Secondary | ICD-10-CM | POA: Diagnosis not present

## 2020-09-23 DIAGNOSIS — R55 Syncope and collapse: Secondary | ICD-10-CM | POA: Diagnosis not present

## 2020-10-14 DIAGNOSIS — R0602 Shortness of breath: Secondary | ICD-10-CM | POA: Diagnosis not present

## 2020-10-14 DIAGNOSIS — I4892 Unspecified atrial flutter: Secondary | ICD-10-CM | POA: Diagnosis not present

## 2020-10-14 DIAGNOSIS — J441 Chronic obstructive pulmonary disease with (acute) exacerbation: Secondary | ICD-10-CM | POA: Diagnosis not present

## 2020-10-21 ENCOUNTER — Other Ambulatory Visit: Payer: Self-pay

## 2020-10-21 ENCOUNTER — Other Ambulatory Visit: Payer: Self-pay | Admitting: Internal Medicine

## 2020-10-21 MED ORDER — PANTOPRAZOLE SODIUM 40 MG PO TBEC: 40.0000 mg | DELAYED_RELEASE_TABLET | Freq: Every day | ORAL | 3 refills | Status: DC

## 2020-10-26 ENCOUNTER — Other Ambulatory Visit: Payer: Self-pay | Admitting: *Deleted

## 2020-10-26 MED ORDER — PANTOPRAZOLE SODIUM 40 MG PO TBEC: 40.0000 mg | DELAYED_RELEASE_TABLET | Freq: Every day | ORAL | 3 refills | Status: DC

## 2020-11-15 ENCOUNTER — Other Ambulatory Visit: Payer: Self-pay | Admitting: *Deleted

## 2020-11-15 MED ORDER — AMLODIPINE BESYLATE 10 MG PO TABS: 10.0000 mg | ORAL_TABLET | Freq: Every day | ORAL | 3 refills | Status: DC

## 2020-12-06 ENCOUNTER — Other Ambulatory Visit: Payer: Self-pay | Admitting: *Deleted

## 2020-12-06 MED ORDER — DILTIAZEM HCL ER 120 MG PO CP12: 120.0000 mg | ORAL_CAPSULE | Freq: Two times a day (BID) | ORAL | 3 refills | Status: DC

## 2020-12-08 ENCOUNTER — Other Ambulatory Visit: Payer: Self-pay | Admitting: *Deleted

## 2020-12-08 MED ORDER — DILTIAZEM HCL ER 120 MG PO CP12: 120.0000 mg | ORAL_CAPSULE | Freq: Two times a day (BID) | ORAL | 3 refills | Status: DC

## 2021-01-10 DIAGNOSIS — J441 Chronic obstructive pulmonary disease with (acute) exacerbation: Secondary | ICD-10-CM | POA: Diagnosis not present

## 2021-01-10 DIAGNOSIS — I10 Essential (primary) hypertension: Secondary | ICD-10-CM | POA: Diagnosis not present

## 2021-01-10 DIAGNOSIS — R0602 Shortness of breath: Secondary | ICD-10-CM | POA: Diagnosis not present

## 2021-01-10 DIAGNOSIS — I4892 Unspecified atrial flutter: Secondary | ICD-10-CM | POA: Diagnosis not present

## 2021-09-06 ENCOUNTER — Other Ambulatory Visit: Payer: Self-pay

## 2021-09-06 ENCOUNTER — Emergency Department: Payer: No Typology Code available for payment source

## 2021-09-06 ENCOUNTER — Encounter: Payer: Self-pay | Admitting: Internal Medicine

## 2021-09-06 ENCOUNTER — Ambulatory Visit (INDEPENDENT_AMBULATORY_CARE_PROVIDER_SITE_OTHER): Payer: No Typology Code available for payment source | Admitting: Internal Medicine

## 2021-09-06 ENCOUNTER — Emergency Department
Admission: EM | Admit: 2021-09-06 | Discharge: 2021-09-06 | Disposition: A | Discharge status: Home / Self Care | Payer: No Typology Code available for payment source | Attending: Emergency Medicine | Admitting: Emergency Medicine

## 2021-09-06 VITALS — BP 183/106 | HR 77 | Ht 73.0 in | Wt 240.0 lb

## 2021-09-06 DIAGNOSIS — F17210 Nicotine dependence, cigarettes, uncomplicated: Secondary | ICD-10-CM | POA: Diagnosis not present

## 2021-09-06 DIAGNOSIS — R42 Dizziness and giddiness: Secondary | ICD-10-CM | POA: Diagnosis not present

## 2021-09-06 DIAGNOSIS — R0600 Dyspnea, unspecified: Secondary | ICD-10-CM | POA: Diagnosis not present

## 2021-09-06 DIAGNOSIS — R531 Weakness: Secondary | ICD-10-CM | POA: Diagnosis not present

## 2021-09-06 DIAGNOSIS — I6930 Unspecified sequelae of cerebral infarction: Secondary | ICD-10-CM

## 2021-09-06 DIAGNOSIS — I6340 Cerebral infarction due to embolism of unspecified cerebral artery: Secondary | ICD-10-CM | POA: Insufficient documentation

## 2021-09-06 DIAGNOSIS — Z8673 Personal history of transient ischemic attack (TIA), and cerebral infarction without residual deficits: Secondary | ICD-10-CM | POA: Diagnosis not present

## 2021-09-06 DIAGNOSIS — I10 Essential (primary) hypertension: Secondary | ICD-10-CM

## 2021-09-06 DIAGNOSIS — J441 Chronic obstructive pulmonary disease with (acute) exacerbation: Secondary | ICD-10-CM | POA: Diagnosis not present

## 2021-09-06 DIAGNOSIS — R29898 Other symptoms and signs involving the musculoskeletal system: Secondary | ICD-10-CM

## 2021-09-06 DIAGNOSIS — I4892 Unspecified atrial flutter: Secondary | ICD-10-CM | POA: Diagnosis not present

## 2021-09-06 LAB — CBC
HCT: 47.2 % (ref 39.0–52.0)
Hemoglobin: 16.1 g/dL (ref 13.0–17.0)
MCH: 35.1 pg — ABNORMAL HIGH (ref 26.0–34.0)
MCHC: 34.1 g/dL (ref 30.0–36.0)
MCV: 102.8 fL — ABNORMAL HIGH (ref 80.0–100.0)
Platelets: 125 10*3/uL — ABNORMAL LOW (ref 150–400)
RBC: 4.59 MIL/uL (ref 4.22–5.81)
RDW: 12.9 % (ref 11.5–15.5)
WBC: 7.7 10*3/uL (ref 4.0–10.5)
nRBC: 0 % (ref 0.0–0.2)

## 2021-09-06 LAB — BASIC METABOLIC PANEL
Anion gap: 7 (ref 5–15)
BUN: 11 mg/dL (ref 8–23)
CO2: 21 mmol/L — ABNORMAL LOW (ref 22–32)
Calcium: 8.7 mg/dL — ABNORMAL LOW (ref 8.9–10.3)
Chloride: 109 mmol/L (ref 98–111)
Creatinine, Ser: 0.89 mg/dL (ref 0.61–1.24)
GFR, Estimated: >60 mL/min (ref >60)
Glucose, Bld: 109 mg/dL — ABNORMAL HIGH (ref 70–99)
Potassium: 4.1 mmol/L (ref 3.5–5.1)
Sodium: 137 mmol/L (ref 135–145)

## 2021-09-06 LAB — TROPONIN I (HIGH SENSITIVITY)
Troponin I (High Sensitivity): 28 ng/L — ABNORMAL HIGH (ref <18)
Troponin I (High Sensitivity): 28 ng/L — ABNORMAL HIGH (ref <18)

## 2021-09-06 MED ORDER — FLUOXETINE HCL 20 MG PO CAPS: 40.0000 mg | ORAL_CAPSULE | Freq: Every day | ORAL | 0 refills | Status: DC

## 2021-09-06 MED ORDER — METOPROLOL TARTRATE 25 MG PO TABS: 25.0000 mg | ORAL_TABLET | Freq: Two times a day (BID) | ORAL | 3 refills | Status: DC

## 2021-09-06 MED ORDER — ATORVASTATIN CALCIUM 40 MG PO TABS: 20.0000 mg | ORAL_TABLET | Freq: Every day | ORAL | 3 refills | Status: DC

## 2021-09-06 MED ORDER — AMLODIPINE BESYLATE 10 MG PO TABS: 10.0000 mg | ORAL_TABLET | Freq: Every day | ORAL | 3 refills | Status: DC

## 2021-09-06 MED ORDER — FUROSEMIDE 20 MG PO TABS: 20.0000 mg | ORAL_TABLET | Freq: Every day | ORAL | 3 refills | Status: DC

## 2021-09-06 MED ORDER — DILTIAZEM HCL ER 60 MG PO CP12: 120.0000 mg | ORAL_CAPSULE | Freq: Two times a day (BID) | ORAL | Status: DC

## 2021-09-06 MED ORDER — APIXABAN 5 MG PO TABS: 5.0000 mg | ORAL_TABLET | Freq: Two times a day (BID) | ORAL | 6 refills | Status: DC

## 2021-09-06 MED ORDER — DILTIAZEM HCL ER 120 MG PO CP12: 120.0000 mg | ORAL_CAPSULE | Freq: Two times a day (BID) | ORAL | 3 refills | Status: DC

## 2021-09-06 MED ORDER — DILTIAZEM HCL ER 60 MG PO CP12
120.0000 mg | ORAL_CAPSULE | Freq: Once | ORAL | Status: AC
  Administered 2021-09-06: 120 mg via ORAL
  Filled 2021-09-06: qty 2

## 2021-09-06 MED ORDER — PANTOPRAZOLE SODIUM 40 MG PO TBEC: 40.0000 mg | DELAYED_RELEASE_TABLET | Freq: Every day | ORAL | 3 refills | Status: DC

## 2021-09-06 MED ORDER — METOPROLOL TARTRATE 25 MG PO TABS
25.0000 mg | ORAL_TABLET | Freq: Once | ORAL | Status: AC
  Administered 2021-09-06: 25 mg via ORAL
  Filled 2021-09-06: qty 1

## 2021-09-06 MED ORDER — POTASSIUM CHLORIDE CRYS ER 20 MEQ PO TBCR: 20.0000 meq | EXTENDED_RELEASE_TABLET | Freq: Two times a day (BID) | ORAL | 3 refills | Status: DC

## 2021-09-06 MED ORDER — AMLODIPINE BESYLATE 5 MG PO TABS: 10.0000 mg | ORAL_TABLET | Freq: Once | ORAL | Status: DC

## 2021-09-06 MED ORDER — APIXABAN 5 MG PO TABS
5.0000 mg | ORAL_TABLET | Freq: Once | ORAL | Status: AC
Start: 2021-09-06 — End: 2021-09-06
  Administered 2021-09-06: 5 mg via ORAL
  Filled 2021-09-06: qty 1

## 2021-09-06 MED ORDER — LISINOPRIL 40 MG PO TABS: 40.0000 mg | ORAL_TABLET | Freq: Every day | ORAL | 3 refills | Status: DC

## 2021-09-06 MED ORDER — BUDESONIDE-FORMOTEROL FUMARATE 160-4.5 MCG/ACT IN AERO: INHALATION_SPRAY | RESPIRATORY_TRACT | 5 refills | Status: DC

## 2021-09-06 MED ORDER — BUPROPION HCL ER (XL) 150 MG PO TB24: 300.0000 mg | ORAL_TABLET | Freq: Every day | ORAL | 1 refills | Status: DC

## 2021-09-06 NOTE — ED Notes (Signed)
Pt attempted to sign for d/c but pad would not work.

## 2021-09-06 NOTE — ED Triage Notes (Signed)
Pt to ED, told by PCP to come to ER because he "had a R sided stroke". Pt and sister are poor historians. They state he had R arm paresis 1 week ago, last time was 3-4 days ago. Pt states both legs and knees have been weak. Pt states was dizzy and SOB 2 days ago. Takes "pills for the dizziness". At this moment, pt is able to move arms and legs. No dysarthria noted. Denies vision changes.  States fell the other day after both knees gave out. States this was when he had the stroke. States has fallen 5-6 times in last 3 months from knees giving out.

## 2021-09-06 NOTE — Assessment & Plan Note (Signed)
Blood pressure is under control the patient refilled blood pressure medications

## 2021-09-06 NOTE — Progress Notes (Signed)
Established Patient Office Visit  Subjective:  Patient ID: Gregory Lane, male    DOB: 04/28/1950  Age: 71 y.o. MRN: [id number]  CC:  Chief Complaint  Patient presents with   Edema    Patent states that he has been having edema on and off in both legs    Numbness    Patient having numbness and tingling in right arm x 3 weeks    Dizziness    Complains of dizziness and feeling like his legs come out from under him     HPI  Gregory Lane presents for weakness of rt arm  and rt leg he has trouble walking  , bp is elevated  , feel exhausted / headache , dizziness and cold sweat  2 wks ago , c/o tachycardia, pt ran out of his med long time , ran out of med and, did not refill it  Past Medical History:  Diagnosis Date   Aortic atherosclerosis ([place]) 02/16/2017   Arthritis    Blind left eye    Hypertension    Low TSH level 02/17/2017   Myocardial infarction [facility])    Perforated duodenal ulcer ([place])    Stroke [facility])     Past Surgical History:  Procedure Laterality Date   EXPLORATORY LAPAROTOMY  05/12/2015   EYE SURGERY     FRACTURE SURGERY     LAPAROTOMY N/A 05/10/2015   Procedure: EXPLORATORY LAPAROTOMY;  Surgeon: Ralene Ok, MD;  Location: [place];  Service: General;  Laterality: N/A;   LEFT HEART CATH AND CORONARY ANGIOGRAPHY N/A 05/02/2016   Procedure: Left Heart Cath and Coronary Angiography;  Surgeon: Adrian Prows, MD;  Location: [place] [facility];  Service: Cardiovascular;  Laterality: N/A;    Family History  Problem Relation Age of Onset   Cirrhosis Father    Hypertension Mother    Lung cancer Sister    Hypertension Sister    Arthritis Sister    Heart murmur Sister     Social History   Socioeconomic History   Marital status: Single    Spouse name: Not on file   Number of children: Not on file   Years of education: Not on file   Highest education level: Not on file  Occupational History   Not on file  Tobacco Use   Smoking status: Every Day     Packs/day: 1.00    Years: 60.00    Total pack years: 60.00    Types: Cigarettes   Smokeless tobacco: Never  Substance and Sexual Activity   Alcohol use: No    Alcohol/week: 60.0 standard drinks of alcohol    Types: 60 Standard drinks or equivalent per week    Comment: daily 12 pk of beer   Drug use: No   Sexual activity: Not on file  Other Topics Concern   Not on file  Social History Narrative   [place] Veteran. Lives alone   Social Determinants of Health   Financial Resource Strain: Not on file  Food Insecurity: Not on file  Transportation Needs: Not on file  Physical Activity: Not on file  Stress: Not on file  Social Connections: Not on file  Intimate Partner Violence: Not on file     Current Outpatient Medications:    albuterol (PROVENTIL HFA;VENTOLIN HFA) 108 (90 Base) MCG/ACT inhaler, Inhale 2 puffs into the lungs every 6 (six) hours as needed for wheezing or shortness of breath., Disp: 1 Inhaler, Rfl: 1   amLODipine (NORVASC) 10 MG tablet, Take  1 tablet (10 mg total) by mouth daily at 2 PM., Disp: 90 tablet, Rfl: 3   budesonide-formoterol (SYMBICORT) 160-4.5 MCG/ACT inhaler, TAKE 2 PUFFS BY MOUTH TWICE A DAY, Disp: 1 each, Rfl: 5   buPROPion (WELLBUTRIN XL) 150 MG 24 hr tablet, Take 2 tablets (300 mg total) by mouth daily., Disp: 90 tablet, Rfl: 1   diltiazem (CARDIZEM SR) 120 MG 12 hr capsule, Take 1 capsule (120 mg total) by mouth every 12 (twelve) hours., Disp: 180 capsule, Rfl: 3   ELIQUIS 5 MG TABS tablet, TAKE 1 TABLET BY MOUTH TWICE A DAY, Disp: 60 tablet, Rfl: 6   FLUoxetine (PROZAC) 20 MG capsule, Take 40 mg by mouth daily., Disp: , Rfl:    isosorbide mononitrate (IMDUR) 60 MG 24 hr tablet, Take 60 mg by mouth daily., Disp: , Rfl:    pantoprazole (PROTONIX) 40 MG tablet, Take 1 tablet (40 mg total) by mouth daily., Disp: 90 tablet, Rfl: 3   atorvastatin (LIPITOR) 40 MG tablet, Take 0.5 tablets (20 mg total) by mouth daily., Disp: 90 tablet, Rfl: 3   furosemide  (LASIX) 20 MG tablet, Take 1 tablet (20 mg total) by mouth daily., Disp: 90 tablet, Rfl: 3   lisinopril (ZESTRIL) 40 MG tablet, Take 1 tablet (40 mg total) by mouth daily., Disp: 90 tablet, Rfl: 3   metoprolol tartrate (LOPRESSOR) 25 MG tablet, Take 1 tablet (25 mg total) by mouth 2 (two) times daily., Disp: 180 tablet, Rfl: 3   potassium chloride SA (KLOR-CON M) 20 MEQ tablet, Take 1 tablet (20 mEq total) by mouth 2 (two) times daily., Disp: 180 tablet, Rfl: 3   No Known Allergies  ROS Review of Systems  Constitutional:  Positive for fatigue. Negative for appetite change, diaphoresis and fever.  HENT:  Negative for congestion, ear discharge and sore throat.   Eyes:  Negative for discharge and redness.  Respiratory:  Negative for chest tightness and shortness of breath.   Cardiovascular:  Positive for leg swelling. Negative for chest pain.  Gastrointestinal:  Negative for abdominal distention.  Genitourinary:  Negative for dysuria.  Musculoskeletal:  Negative for arthralgias and back pain.  Skin:  Negative for rash.      Objective:    Physical Exam Vitals reviewed.  Constitutional:      Appearance: Normal appearance.  HENT:     Mouth/Throat:     Mouth: Mucous membranes are moist.  Eyes:     Pupils: Pupils are equal, round, and reactive to light.  Neck:     Vascular: No carotid bruit.  Cardiovascular:     Rate and Rhythm: Normal rate and regular rhythm.     Pulses: Normal pulses.     Heart sounds: Normal heart sounds.  Pulmonary:     Effort: Pulmonary effort is normal.     Breath sounds: Normal breath sounds.  Abdominal:     General: Bowel sounds are normal.     Palpations: Abdomen is soft. There is no hepatomegaly, splenomegaly or mass.     Tenderness: There is no abdominal tenderness.     Hernia: No hernia is present.  Musculoskeletal:     Cervical back: Neck supple.     Right lower leg: No edema.     Left lower leg: No edema.  Skin:    Findings: No rash.   Neurological:     Mental Status: He is alert and oriented to person, place, and time.     Motor: No weakness.  Deep Tendon Reflexes:     Reflex Scores:      Tricep reflexes are 0 on the right side and 0 on the left side.      Bicep reflexes are 0 on the right side and 0 on the left side.      Brachioradialis reflexes are 0 on the left side.      Patellar reflexes are 0 on the right side and 0 on the left side.    Comments: Patient has a weakness of the right hand right leg also has facial asymmetry he has unsteady gait, the exam is consistent with right-sided weakness.  He drove to my office I will want to send him to the hospital he is calling his sister to drive him to the hospital.  Psychiatric:        Mood and Affect: Mood normal.        Behavior: Behavior normal.     BP (!) 183/106   Pulse 77   Ht 6\' 1"  (1.854 m)   Wt 240 lb (108.9 kg)   BMI 31.66 kg/m  Wt Readings from Last 3 Encounters:  09/06/21 240 lb (108.9 kg)  08/02/20 231 lb (104.8 kg)  04/28/20 233 lb 11.2 oz (106 kg)     Health Maintenance Due  Topic Date Due   Hepatitis C Screening  Never done   COLONOSCOPY (Pts 45-93yrs Insurance coverage will need to be confirmed)  Never done   Zoster Vaccines- Shingrix (1 of 2) Never done   Pneumonia Vaccine 79+ Years old (2 - PCV) 05/14/2016   COVID-19 Vaccine (2 - Moderna series) 11/24/2019    There are no preventive care reminders to display for this patient.  Lab Results  Component Value Date   TSH 1.78 12/19/2019   Lab Results  Component Value Date   WBC 7.5 08/02/2020   HGB 15.6 08/02/2020   HCT 45.4 08/02/2020   MCV 98.9 08/02/2020   PLT 216 08/02/2020   Lab Results  Component Value Date   NA 135 08/02/2020   K 4.0 08/02/2020   CO2 25 08/02/2020   GLUCOSE 100 (H) 08/02/2020   BUN 13 08/02/2020   CREATININE 0.85 08/02/2020   BILITOT 0.5 12/19/2019   ALKPHOS 71 02/16/2017   AST 13 12/19/2019   ALT 20 12/19/2019   PROT 7.4 12/19/2019    ALBUMIN 3.7 02/16/2017   CALCIUM 8.9 08/02/2020   ANIONGAP 7 08/02/2020   Lab Results  Component Value Date   CHOL 235 (H) 12/19/2019   Lab Results  Component Value Date   HDL 46 12/19/2019   Lab Results  Component Value Date   LDLCALC 160 (H) 12/19/2019   Lab Results  Component Value Date   TRIG 155 (H) 12/19/2019   Lab Results  Component Value Date   CHOLHDL 5.1 (H) 12/19/2019   Lab Results  Component Value Date   HGBA1C 5.5 05/24/2015      Assessment & Plan:   Problem List Items Addressed This Visit       Cardiovascular and Mediastinum   Essential hypertension - Primary    Blood pressure is under control the patient refilled blood pressure medications      Relevant Medications   atorvastatin (LIPITOR) 40 MG tablet   lisinopril (ZESTRIL) 40 MG tablet   metoprolol tartrate (LOPRESSOR) 25 MG tablet   furosemide (LASIX) 20 MG tablet   Other Relevant Orders   EKG 12-Lead   Atrial flutter with rapid ventricular response ([place])  Patient is not taking his Eliquis for some time      Relevant Medications   atorvastatin (LIPITOR) 40 MG tablet   lisinopril (ZESTRIL) 40 MG tablet   metoprolol tartrate (LOPRESSOR) 25 MG tablet   furosemide (LASIX) 20 MG tablet   Cerebrovascular accident (CVA) due to embolism of cerebral artery ([place])    Patient has a stroke on the right side is weak in the right upper limb right leg gait is unsteady, plan is to send the patient to the hospital We will need to obtain a CT scan to make sure he does not have a bleed before started medication for atrial fibrillation again.  He is waiting for his sister to come and to take him to the hospital.      Relevant Medications   atorvastatin (LIPITOR) 40 MG tablet   lisinopril (ZESTRIL) 40 MG tablet   metoprolol tartrate (LOPRESSOR) 25 MG tablet   furosemide (LASIX) 20 MG tablet     Respiratory   COPD with acute exacerbation (HCC)    Patient is smoking 1 pack/day. - I instructed the  patient to stop smoking and provided them with smoking cessation materials.  - I informed the patient that smoking puts them at increased risk for cancer, COPD, hypertension, and more.  - Informed the patient to seek help if they begin to have trouble breathing, develop chest pain, start to cough up blood, feel faint, or pass out.       Meds ordered this encounter  Medications   atorvastatin (LIPITOR) 40 MG tablet    Sig: Take 0.5 tablets (20 mg total) by mouth daily.    Dispense:  90 tablet    Refill:  3   lisinopril (ZESTRIL) 40 MG tablet    Sig: Take 1 tablet (40 mg total) by mouth daily.    Dispense:  90 tablet    Refill:  3   metoprolol tartrate (LOPRESSOR) 25 MG tablet    Sig: Take 1 tablet (25 mg total) by mouth 2 (two) times daily.    Dispense:  180 tablet    Refill:  3   potassium chloride SA (KLOR-CON M) 20 MEQ tablet    Sig: Take 1 tablet (20 mEq total) by mouth 2 (two) times daily.    Dispense:  180 tablet    Refill:  3   furosemide (LASIX) 20 MG tablet    Sig: Take 1 tablet (20 mg total) by mouth daily.    Dispense:  90 tablet    Refill:  3  Report of the electrocardiogram.  Electrocardiogram revealed atrial fibrillation nonspecific ST-T abnormalities.  Old septal infarction.  Abnormal EKG  Follow-up: No follow-ups on file.    Cletis Athens, MD

## 2021-09-06 NOTE — ED Notes (Signed)
Patient transported to CT 

## 2021-09-06 NOTE — ED Provider Notes (Signed)
[facility] Provider Note   Event Date/Time   First MD Initiated Contact with Patient 09/06/21 1457     (approximate) History  No chief complaint on file.  HPI Gregory Lane is a 71 y.o. male with a past medical history of CVA with intermittent right-sided deficits who presents from his primary care 54 office after being off of all of his medications including his Eliquis for A-fib.  There was some concern on starting patient's Eliquis back given his intermittent neurologic symptoms and possibility of acute hemorrhagic stroke.  Patient denies any complaints at this time and requests prescriptions for all of his home medicines prior to discharge.  Patient does state that he has had intermittent bilateral lower extremity weakness that is caused him to fall that is unchanged from his previous and states that this has been going on over the last 10 years. ROS: Patient currently denies any vision changes, tinnitus, difficulty speaking, facial droop, sore throat, chest pain, shortness of breath, abdominal pain, nausea/vomiting/diarrhea, dysuria, or weakness/numbness/paresthesias in any extremity   Physical Exam  Triage Vital Signs: ED Triage Vitals  Enc Vitals Group     BP 09/06/21 1320 (!) 164/86     Pulse Rate 09/06/21 1320 80     Resp 09/06/21 1320 16     Temp 09/06/21 1320 98.2 F (36.8 C)     Temp Source 09/06/21 1320 Oral     SpO2 09/06/21 1320 96 %     Weight 09/06/21 1321 237 lb (107.5 kg)     Height 09/06/21 1321 6\' 1"  (1.854 m)     Head Circumference --      Peak Flow --      Pain Score 09/06/21 1321 6     Pain Loc --      Pain Edu? --      Excl. in [place]? --    Most recent vital signs: Vitals:   09/06/21 1600 09/06/21 1630  BP: (!) 172/97 (!) 177/93  Pulse: 64 73  Resp: 16 16  Temp:    SpO2: 98% 98%   General: Awake, oriented x4. CV:  Good peripheral perfusion.  Resp:  Normal effort.  Abd:  No distention.  Other:  Overweight elderly  Caucasian male laying in bed in no acute distress.  NIHSS 0 ED Results / Procedures / Treatments  Labs (all labs ordered are listed, but only abnormal results are displayed) Labs Reviewed  BASIC METABOLIC PANEL - Abnormal; Notable for the following components:      Result Value   CO2 21 (*)    Glucose, Bld 109 (*)    Calcium 8.7 (*)    All other components within normal limits  CBC - Abnormal; Notable for the following components:   MCV 102.8 (*)    MCH 35.1 (*)    Platelets 125 (*)    All other components within normal limits  TROPONIN I (HIGH SENSITIVITY) - Abnormal; Notable for the following components:   Troponin I (High Sensitivity) 28 (*)    All other components within normal limits  TROPONIN I (HIGH SENSITIVITY) - Abnormal; Notable for the following components:   Troponin I (High Sensitivity) 28 (*)    All other components within normal limits   EKG ED ECG REPORT I, [name], the attending physician, personally viewed and interpreted this ECG. Date: 09/06/2021 EKG Time: 1315 Rate: 84 Rhythm: Atrial fibrillation QRS Axis: normal Intervals: normal ST/T Wave abnormalities: normal Narrative Interpretation: Atrial fibrillation.  No evidence of acute ischemia RADIOLOGY ED MD interpretation: CT of the head without contrast interpreted by me shows no evidence of acute abnormalities including no intracerebral hemorrhage, obvious masses, or significant edema.  There is evidence of atrophy and chronic small vessel ischemic changes of the white matter as well as encephalomalacia seen within the right temporal lobe and bilateral frontal lobes consistent with previous CVA -Agree with radiology assessment Official radiology report(s): CT Head Wo Contrast  Result Date: 09/06/2021 CLINICAL DATA:  Right arm weakness difficulty walking EXAM: CT HEAD WITHOUT CONTRAST TECHNIQUE: Contiguous axial images were obtained from the base of the skull through the vertex without intravenous  contrast. RADIATION DOSE REDUCTION: This exam was performed according to the departmental dose-optimization program which includes automated exposure control, adjustment of the mA and/or kV according to patient size and/or use of iterative reconstruction technique. COMPARISON:  CT brain 08/02/2020 FINDINGS: Brain: No acute territorial infarction, hemorrhage or intracranial mass. Encephalomalacia in the right temporal lobe and right greater than left inferior frontal lobes. Atrophy and chronic small vessel ischemic changes of the white matter. Stable ventricle size. Vascular: No hyperdense vessels.  Carotid vascular calcification. Skull: Normal. Negative for fracture or focal lesion. Sinuses/Orbits: No acute finding. Other: None IMPRESSION: 1. No CT evidence for acute intracranial abnormality. 2. Atrophy and chronic small vessel ischemic changes of the white matter. Encephalomalacia within the right temporal lobe and bilateral frontal lobes. Electronically Signed   By: Donavan Foil M.D.   On: 09/06/2021 15:30   DG Chest 2 View  Result Date: 09/06/2021 CLINICAL DATA:  Dyspnea EXAM: CHEST - 2 VIEW COMPARISON:  08/02/2020 chest radiograph. FINDINGS: Stable cardiomediastinal silhouette with normal heart size. No pneumothorax. No pleural effusion. Lungs appear clear, with no acute consolidative airspace disease and no pulmonary edema. IMPRESSION: No active cardiopulmonary disease. Electronically Signed   By: Ilona Sorrel M.D.   On: 09/06/2021 13:52   PROCEDURES: Critical Care performed: No .1-3 Lead EKG Interpretation  Performed by: [name], MD Authorized by: [name], MD     Interpretation: normal     ECG rate:  74   ECG rate assessment: normal     Rhythm: sinus rhythm     Ectopy: none     Conduction: normal    MEDICATIONS ORDERED IN ED: Medications  metoprolol tartrate (LOPRESSOR) tablet 25 mg (has no administration in time range)  diltiazem (CARDIZEM SR) 12 hr capsule 120 mg (has no  administration in time range)  apixaban (ELIQUIS) tablet 5 mg (has no administration in time range)   IMPRESSION / MDM / ASSESSMENT AND PLAN / ED COURSE  I reviewed the triage vital signs and the nursing notes.                             The patient is on the cardiac monitor to evaluate for evidence of arrhythmia and/or significant heart rate changes. Patient's presentation is most consistent with acute presentation with potential threat to life or bodily function. Patient is 71 year old male with the above-stated past medical history who presents from his primary care physician's office with concern for possible neurologic complication after being off of his anticoagulation for his chronic atrial fibrillation.  Patient denies any current neurologic deficits but does state that he has had intermittent weakness in bilateral lower extremities as well as wife at bedside adding to patient's history stating that he had an episode of weakness in the right  arm that occurred 1 week ago and lasted approximately 2 hours that also coincided with an episode of high blood pressure.  Differential diagnosis includes but is not limited to: Hemorrhagic stroke, ischemic stroke, TIA, MS, meningitis, ACS, A-fib with RVR Laboratory evaluation only significant for an elevated troponin to 28 that remained stable over 3-hour recheck without any evidence of ischemic changes on EKG.  Patient's head CT did not show any evidence of hemorrhagic stroke and therefore restart patient's Eliquis here in our emergency department.  All of patient's medications renewed  Dispo: Discharge home with home care and PCP follow-up   FINAL CLINICAL IMPRESSION(S) / ED DIAGNOSES   Final diagnoses:  Vertigo  History of CVA with residual deficit  Transient weakness of lower extremity   Rx / DC Orders   ED Discharge Orders          Ordered    amLODipine (NORVASC) 10 MG tablet  Daily        09/06/21 1632    atorvastatin (LIPITOR) 40 MG  tablet  Daily        09/06/21 1632    budesonide-formoterol (SYMBICORT) 160-4.5 MCG/ACT inhaler        09/06/21 1632    buPROPion (WELLBUTRIN XL) 150 MG 24 hr tablet  Daily        09/06/21 1632    diltiazem (CARDIZEM SR) 120 MG 12 hr capsule  Every 12 hours        09/06/21 1632    apixaban (ELIQUIS) 5 MG TABS tablet  2 times daily        09/06/21 1632    FLUoxetine (PROZAC) 20 MG capsule  Daily        09/06/21 1632    furosemide (LASIX) 20 MG tablet  Daily        09/06/21 1632    lisinopril (ZESTRIL) 40 MG tablet  Daily        09/06/21 1632    metoprolol tartrate (LOPRESSOR) 25 MG tablet  2 times daily        09/06/21 1632    pantoprazole (PROTONIX) 40 MG tablet  Daily        09/06/21 1632    potassium chloride SA (KLOR-CON M) 20 MEQ tablet  2 times daily        09/06/21 1632           Note:  This document was prepared using Dragon voice recognition software and may include unintentional dictation errors.   [name], MD 09/06/21 1655

## 2021-09-06 NOTE — Assessment & Plan Note (Signed)
Patient is smoking 1 pack/day. - I instructed the patient to stop smoking and provided them with smoking cessation materials.  - I informed the patient that smoking puts them at increased risk for cancer, COPD, hypertension, and more.  - Informed the patient to seek help if they begin to have trouble breathing, develop chest pain, start to cough up blood, feel faint, or pass out.

## 2021-09-06 NOTE — Assessment & Plan Note (Signed)
Patient is not taking his Eliquis for some time

## 2021-09-06 NOTE — Assessment & Plan Note (Signed)
Patient has a stroke on the right side is weak in the right upper limb right leg gait is unsteady, plan is to send the patient to the hospital We will need to obtain a CT scan to make sure he does not have a bleed before started medication for atrial fibrillation again.  He is waiting for his sister to come and to take him to the hospital.

## 2021-09-06 NOTE — ED Notes (Signed)
Pt reports he has had several strokes recently.  Denies new weakness or vision changes.

## 2021-09-06 NOTE — ED Notes (Signed)
Pt verbalized understanding of discharge instructions. Opportunity for questions provided.  

## 2021-09-09 ENCOUNTER — Ambulatory Visit (INDEPENDENT_AMBULATORY_CARE_PROVIDER_SITE_OTHER): Payer: No Typology Code available for payment source | Admitting: *Deleted

## 2021-09-09 DIAGNOSIS — Z0000 Encounter for general adult medical examination without abnormal findings: Secondary | ICD-10-CM

## 2021-09-09 NOTE — Progress Notes (Addendum)
Subjective:   Gregory Lane is a 71 y.o. male who presents for Medicare Annual/Subsequent preventive examination.   I discussed the limitations of evaluation and management by telemedicine and the availability of in person appointments. Patient expressed understanding and agreed to proceed.   Visit performed using audio  Patient:home Provider:home   Review of Systems    Defer to provider       Objective:    Today's Vitals   09/09/21 0915  PainSc: 0-No pain   There is no height or weight on file to calculate BMI.     09/06/2021    1:23 PM 08/02/2020   12:23 PM 02/17/2017    2:00 AM 05/02/2016    9:39 AM 05/24/2015   10:45 PM 05/24/2015   12:31 PM 05/09/2015   11:49 PM  Advanced Directives  Does Patient Have a Medical Advance Directive? No No Yes No No No Yes  Type of Chief Operating Officer of [employer] Power of Attorney  Does patient want to make changes to medical advance directive?   No - Patient declined  No - Patient declined  No - Patient declined  Would patient like information on creating a medical advance directive?  No - Patient declined  No - Patient declined       Current Medications (verified) Outpatient Encounter Medications as of 09/09/2021  Medication Sig   albuterol (PROVENTIL HFA;VENTOLIN HFA) 108 (90 Base) MCG/ACT inhaler Inhale 2 puffs into the lungs every 6 (six) hours as needed for wheezing or shortness of breath.   amLODipine (NORVASC) 10 MG tablet Take 1 tablet (10 mg total) by mouth daily at 2 PM.   apixaban (ELIQUIS) 5 MG TABS tablet Take 1 tablet (5 mg total) by mouth 2 (two) times daily.   atorvastatin (LIPITOR) 40 MG tablet Take 0.5 tablets (20 mg total) by mouth daily.   budesonide-formoterol (SYMBICORT) 160-4.5 MCG/ACT inhaler TAKE 2 PUFFS BY MOUTH TWICE A DAY   buPROPion (WELLBUTRIN XL) 150 MG 24 hr tablet Take 2 tablets (300 mg total) by mouth daily.   diltiazem (CARDIZEM SR) 120 MG 12  hr capsule Take 1 capsule (120 mg total) by mouth every 12 (twelve) hours.   FLUoxetine (PROZAC) 20 MG capsule Take 2 capsules (40 mg total) by mouth daily.   furosemide (LASIX) 20 MG tablet Take 1 tablet (20 mg total) by mouth daily.   isosorbide mononitrate (IMDUR) 60 MG 24 hr tablet Take 60 mg by mouth daily.   lisinopril (ZESTRIL) 40 MG tablet Take 1 tablet (40 mg total) by mouth daily.   metoprolol tartrate (LOPRESSOR) 25 MG tablet Take 1 tablet (25 mg total) by mouth 2 (two) times daily.   pantoprazole (PROTONIX) 40 MG tablet Take 1 tablet (40 mg total) by mouth daily.   potassium chloride SA (KLOR-CON M) 20 MEQ tablet Take 1 tablet (20 mEq total) by mouth 2 (two) times daily.   No facility-administered encounter medications on file as of 09/09/2021.    Allergies (verified) Patient has no known allergies.   History: Past Medical History:  Diagnosis Date   Aortic atherosclerosis ([place]) 02/16/2017   Arthritis    Blind left eye    Hypertension    Low TSH level 02/17/2017   Myocardial infarction [facility])    Perforated duodenal ulcer ([place])    Stroke [facility])    Past Surgical History:  Procedure Laterality Date   EXPLORATORY LAPAROTOMY  05/12/2015   EYE  SURGERY     FRACTURE SURGERY     LAPAROTOMY N/A 05/10/2015   Procedure: EXPLORATORY LAPAROTOMY;  Surgeon: Ralene Ok, MD;  Location: [place];  Service: General;  Laterality: N/A;   LEFT HEART CATH AND CORONARY ANGIOGRAPHY N/A 05/02/2016   Procedure: Left Heart Cath and Coronary Angiography;  Surgeon: Adrian Prows, MD;  Location: [place] [facility];  Service: Cardiovascular;  Laterality: N/A;   Family History  Problem Relation Age of Onset   Cirrhosis Father    Hypertension Mother    Lung cancer Sister    Hypertension Sister    Arthritis Sister    Heart murmur Sister    Social History   Socioeconomic History   Marital status: Single    Spouse name: Not on file   Number of children: Not on file   Years of education: Not on file    Highest education level: Not on file  Occupational History   Not on file  Tobacco Use   Smoking status: Every Day    Packs/day: 1.00    Years: 60.00    Total pack years: 60.00    Types: Cigarettes   Smokeless tobacco: Never  Substance and Sexual Activity   Alcohol use: No    Alcohol/week: 60.0 standard drinks of alcohol    Types: 60 Standard drinks or equivalent per week    Comment: daily 12 pk of beer   Drug use: No   Sexual activity: Not Currently  Other Topics Concern   Not on file  Social History Narrative   [place] Veteran. Lives alone   Social Determinants of Health   Financial Resource Strain: Low Risk  (09/09/2021)   Overall Financial Resource Strain (CARDIA)    Difficulty of Paying Living Expenses: Not very hard  Food Insecurity: No Food Insecurity (09/09/2021)   Hunger Vital Sign    Worried About Running Out of Food in the Last Year: Never true    Ran Out of Food in the Last Year: Never true  Transportation Needs: No Transportation Needs (09/09/2021)   PRAPARE - Hydrologist (Medical): No    Lack of Transportation (Non-Medical): No  Physical Activity: Inactive (09/09/2021)   Exercise Vital Sign    Days of Exercise per Week: 0 days    Minutes of Exercise per Session: 0 min  Stress: No Stress Concern Present (09/09/2021)   [place]    Feeling of Stress : Only a little  Social Connections: Socially Isolated (09/09/2021)   Social Connection and Isolation Panel [NHANES]    Frequency of Communication with Friends and Family: More than three times a week    Frequency of Social Gatherings with Friends and Family: More than three times a week    Attends Religious Services: Never    Marine scientist or Organizations: No    Attends Music therapist: Never    Marital Status: Never married    Tobacco Counseling Ready to quit: Not Answered Counseling given:  Not Answered   Clinical Intake:  Pre-visit preparation completed: Yes  Pain : No/denies pain Pain Score: 0-No pain Pain Type: Chronic pain Pain Location: Knee Pain Orientation: Left Pain Descriptors / Indicators: Aching     Nutritional Risks: None Diabetes: No  How often do you need to have someone help you when you read instructions, pamphlets, or other written materials from your doctor or pharmacy?: 1 - Never What is the last grade  level you completed in school?: high school  Diabetic?no  Interpreter Needed?: No  Information entered by :: Lacretia Nicks, cma   Activities of Daily Living     No data to display          Patient Care Team: Cletis Athens, MD as PCP - General (Internal Medicine)  Indicate any recent Medical Services you may have received from other than [facility] providers in the past year (date may be approximate).     Assessment:   This is a routine wellness examination for Gregory Lane.  Hearing/Vision screen No results found.  Dietary issues and exercise activities discussed:     Goals Addressed   None    Depression Screen    09/09/2021    9:18 AM 08/02/2020   10:19 AM  PHQ 2/9 Scores  PHQ - 2 Score 0 0    Fall Risk    12/26/2019    2:17 PM 08/17/2017    2:37 PM  [place] in the past year? 0 No  Number falls in past yr: 0   Injury with Fall? 0     FALL RISK PREVENTION PERTAINING TO THE HOME:  Any stairs in or around the home? No  If so, are there any without handrails? No  Home free of loose throw rugs in walkways, pet beds, electrical cords, etc? Yes  Adequate lighting in your home to reduce risk of falls? Yes   ASSISTIVE DEVICES UTILIZED TO PREVENT FALLS:  Life alert? No  Use of a cane, walker or w/c? No  Grab bars in the bathroom? No  Shower chair or bench in shower? No  Elevated toilet seat or a handicapped toilet? No   TIMED UP AND GO:  Was the test performed? No .  Length of time to ambulate -  NA    Cognitive Function:    09/09/2021    9:20 AM  MMSE - Mini Mental State Exam  Not completed: Unable to complete        09/09/2021    9:20 AM  6CIT Screen  What Year? 0 points  What month? 0 points  What time? 0 points  Count back from 20 0 points  Months in reverse 4 points  Repeat phrase 4 points  Total Score 8 points    Immunizations Immunization History  Administered Date(s) Administered   H1N1 04/01/2008   Influenza, Seasonal, Injecte, Preservative Fre 01/13/2009, 10/28/2009, 11/07/2011   Influenza-Unspecified 02/14/2005, 03/28/2006, 12/04/2006, 11/29/2007   Moderna Sars-Covid-2 Vaccination 09/29/2019   Pneumococcal Polysaccharide-23 05/02/2006, 05/15/2015   Tdap 06/23/2011, 12/26/2019    TDAP status: Up to date  Flu Vaccine status: Up to date  Pneumococcal vaccine status: Due, Education has been provided regarding the importance of this vaccine. Advised may receive this vaccine at local pharmacy or Health Dept. Aware to provide a copy of the vaccination record if obtained from local pharmacy or Health Dept. Verbalized acceptance and understanding.  Covid-19 vaccine status: Information provided on how to obtain vaccines.   Qualifies for Shingles Vaccine? Yes   Zostavax completed No   Shingrix Completed?: No.    Education has been provided regarding the importance of this vaccine. Patient has been advised to call insurance company to determine out of pocket expense if they have not yet received this vaccine. Advised may also receive vaccine at local pharmacy or Health Dept. Verbalized acceptance and understanding.  Screening Tests Health Maintenance  Topic Date Due   Hepatitis C Screening  Never done  COLONOSCOPY (Pts 45-35yrs Insurance coverage will need to be confirmed)  Never done   Zoster Vaccines- Shingrix (1 of 2) Never done   Pneumonia Vaccine 42+ Years old (2 - PCV) 05/14/2016   COVID-19 Vaccine (2 - Moderna series) 11/24/2019   INFLUENZA VACCINE   09/20/2021   TETANUS/TDAP  12/25/2029   HPV VACCINES  Aged Out    Health Maintenance  Health Maintenance Due  Topic Date Due   Hepatitis C Screening  Never done   COLONOSCOPY (Pts 45-2yrs Insurance coverage will need to be confirmed)  Never done   Zoster Vaccines- Shingrix (1 of 2) Never done   Pneumonia Vaccine 71+ Years old (2 - PCV) 05/14/2016   COVID-19 Vaccine (2 - Moderna series) 11/24/2019    Patient declined colonoscopy  Lung Cancer Screening: (Low Dose CT Chest recommended if Age 69-80 years, 30 pack-year currently smoking OR have quit w/in 15years.) does qualify.   Lung Cancer Screening Referral: patient declined  Additional Screening:  Hepatitis C Screening: does qualify; will complete with next labs   Vision Screening: Recommended annual ophthalmology exams for early detection of glaucoma and other disorders of the eye. Is the patient up to date with their annual eye exam?  No  Who is the provider or what is the name of the office in which the patient attends annual eye exams? [facility] If pt is not established with a provider, would they like to be referred to a provider to establish care? No .   Dental Screening: Recommended annual dental exams for proper oral hygiene  Community Resource Referral / Chronic Care Management: CRR required this visit?  No   CCM required this visit?  No      Plan:     I have personally reviewed and noted the following in the patient's chart:   Medical and social history Use of alcohol, tobacco or illicit drugs  Current medications and supplements including opioid prescriptions. Patient is not currently taking opioid prescriptions. Functional ability and status Nutritional status Physical activity Advanced directives List of other physicians Hospitalizations, surgeries, and ER visits in previous 12 months Vitals Screenings to include cognitive, depression, and falls Referrals and appointments  In addition, I have  reviewed and discussed with patient certain preventive protocols, quality metrics, and best practice recommendations. A written personalized care plan for preventive services as well as general preventive health recommendations were provided to patient.     Lacretia Nicks, [place]   09/09/2021   Nurse Notes:  Gregory Lane , Thank you for taking time to come for your Medicare Wellness Visit. I appreciate your ongoing commitment to your health goals. Please review the following plan we discussed and let me know if I can assist you in the future.   These are the goals we discussed:  Goals   None     This is a list of the screening recommended for you and due dates:  Health Maintenance  Topic Date Due   Hepatitis C Screening: USPSTF Recommendation to screen - Ages 28-79 yo.  Never done   Colon Cancer Screening  Never done   Zoster (Shingles) Vaccine (1 of 2) Never done   Pneumonia Vaccine (2 - PCV) 05/14/2016   COVID-19 Vaccine (2 - Moderna series) 11/24/2019   Flu Shot  09/20/2021   Tetanus Vaccine  12/25/2029   HPV Vaccine  Aged Out      I have reviewed and agreed to the above documentation.   Theresia Lo, FNP

## 2021-09-20 ENCOUNTER — Encounter: Payer: Self-pay | Admitting: Emergency Medicine

## 2021-09-20 DIAGNOSIS — J449 Chronic obstructive pulmonary disease, unspecified: Secondary | ICD-10-CM | POA: Diagnosis present

## 2021-09-20 DIAGNOSIS — E669 Obesity, unspecified: Secondary | ICD-10-CM | POA: Diagnosis present

## 2021-09-20 DIAGNOSIS — Z8249 Family history of ischemic heart disease and other diseases of the circulatory system: Secondary | ICD-10-CM

## 2021-09-20 DIAGNOSIS — H5462 Unqualified visual loss, left eye, normal vision right eye: Secondary | ICD-10-CM | POA: Diagnosis present

## 2021-09-20 DIAGNOSIS — E785 Hyperlipidemia, unspecified: Secondary | ICD-10-CM | POA: Diagnosis present

## 2021-09-20 DIAGNOSIS — K219 Gastro-esophageal reflux disease without esophagitis: Secondary | ICD-10-CM | POA: Diagnosis present

## 2021-09-20 DIAGNOSIS — I252 Old myocardial infarction: Secondary | ICD-10-CM

## 2021-09-20 DIAGNOSIS — M79604 Pain in right leg: Secondary | ICD-10-CM | POA: Diagnosis not present

## 2021-09-20 DIAGNOSIS — I70222 Atherosclerosis of native arteries of extremities with rest pain, left leg: Principal | ICD-10-CM | POA: Diagnosis present

## 2021-09-20 DIAGNOSIS — Z7951 Long term (current) use of inhaled steroids: Secondary | ICD-10-CM

## 2021-09-20 DIAGNOSIS — M1990 Unspecified osteoarthritis, unspecified site: Secondary | ICD-10-CM | POA: Diagnosis present

## 2021-09-20 DIAGNOSIS — I7143 Infrarenal abdominal aortic aneurysm, without rupture: Secondary | ICD-10-CM | POA: Diagnosis present

## 2021-09-20 DIAGNOSIS — M79605 Pain in left leg: Secondary | ICD-10-CM | POA: Diagnosis not present

## 2021-09-20 DIAGNOSIS — I2510 Atherosclerotic heart disease of native coronary artery without angina pectoris: Secondary | ICD-10-CM | POA: Diagnosis present

## 2021-09-20 DIAGNOSIS — Z6831 Body mass index (BMI) 31.0-31.9, adult: Secondary | ICD-10-CM

## 2021-09-20 DIAGNOSIS — I129 Hypertensive chronic kidney disease with stage 1 through stage 4 chronic kidney disease, or unspecified chronic kidney disease: Secondary | ICD-10-CM | POA: Diagnosis present

## 2021-09-20 DIAGNOSIS — I998 Other disorder of circulatory system: Secondary | ICD-10-CM | POA: Diagnosis not present

## 2021-09-20 DIAGNOSIS — Z7901 Long term (current) use of anticoagulants: Secondary | ICD-10-CM

## 2021-09-20 DIAGNOSIS — I4892 Unspecified atrial flutter: Secondary | ICD-10-CM | POA: Diagnosis present

## 2021-09-20 DIAGNOSIS — Z8673 Personal history of transient ischemic attack (TIA), and cerebral infarction without residual deficits: Secondary | ICD-10-CM

## 2021-09-20 DIAGNOSIS — F329 Major depressive disorder, single episode, unspecified: Secondary | ICD-10-CM | POA: Diagnosis present

## 2021-09-20 DIAGNOSIS — Z79899 Other long term (current) drug therapy: Secondary | ICD-10-CM

## 2021-09-20 DIAGNOSIS — I480 Paroxysmal atrial fibrillation: Secondary | ICD-10-CM | POA: Diagnosis present

## 2021-09-20 DIAGNOSIS — N189 Chronic kidney disease, unspecified: Secondary | ICD-10-CM | POA: Diagnosis present

## 2021-09-20 DIAGNOSIS — I4891 Unspecified atrial fibrillation: Secondary | ICD-10-CM | POA: Diagnosis present

## 2021-09-20 DIAGNOSIS — R001 Bradycardia, unspecified: Secondary | ICD-10-CM | POA: Diagnosis not present

## 2021-09-20 DIAGNOSIS — F17210 Nicotine dependence, cigarettes, uncomplicated: Secondary | ICD-10-CM | POA: Diagnosis present

## 2021-09-20 DIAGNOSIS — I700 Atherosclerosis of aorta: Secondary | ICD-10-CM | POA: Diagnosis present

## 2021-09-20 MED ORDER — OXYCODONE-ACETAMINOPHEN 5-325 MG PO TABS
1.0000 | ORAL_TABLET | Freq: Once | ORAL | Status: AC
  Administered 2021-09-20: 1 via ORAL
  Filled 2021-09-20: qty 1

## 2021-09-20 NOTE — ED Triage Notes (Signed)
Pt presents via EMS with complaints of left leg pain that started about 4 hours ago. Pt took ibuprofen PTA without improvement. No deformities noted. Denies injury or falls.

## 2021-09-21 ENCOUNTER — Emergency Department: Payer: No Typology Code available for payment source

## 2021-09-21 ENCOUNTER — Inpatient Hospital Stay
Admit: 2021-09-21 | Discharge: 2021-09-21 | Disposition: A | Discharge status: Home / Self Care | Payer: No Typology Code available for payment source | Attending: Cardiology | Admitting: Cardiology

## 2021-09-21 ENCOUNTER — Other Ambulatory Visit: Payer: Self-pay

## 2021-09-21 ENCOUNTER — Inpatient Hospital Stay
Admission: EM | Admit: 2021-09-21 | Discharge: 2021-09-26 | DRG: 271 | Disposition: A | Discharge status: Home Health | Payer: No Typology Code available for payment source | Attending: Family Medicine | Admitting: Family Medicine

## 2021-09-21 ENCOUNTER — Encounter
Admission: EM | Disposition: A | Discharge status: Home Health | Payer: Self-pay | Source: Home / Self Care | Attending: Family Medicine

## 2021-09-21 DIAGNOSIS — J449 Chronic obstructive pulmonary disease, unspecified: Secondary | ICD-10-CM | POA: Diagnosis not present

## 2021-09-21 DIAGNOSIS — I10 Essential (primary) hypertension: Secondary | ICD-10-CM | POA: Diagnosis present

## 2021-09-21 DIAGNOSIS — F17210 Nicotine dependence, cigarettes, uncomplicated: Secondary | ICD-10-CM | POA: Diagnosis not present

## 2021-09-21 DIAGNOSIS — E785 Hyperlipidemia, unspecified: Secondary | ICD-10-CM

## 2021-09-21 DIAGNOSIS — M1990 Unspecified osteoarthritis, unspecified site: Secondary | ICD-10-CM | POA: Diagnosis not present

## 2021-09-21 DIAGNOSIS — I4892 Unspecified atrial flutter: Secondary | ICD-10-CM

## 2021-09-21 DIAGNOSIS — M62262 Nontraumatic ischemic infarction of muscle, left lower leg: Secondary | ICD-10-CM | POA: Diagnosis not present

## 2021-09-21 DIAGNOSIS — I743 Embolism and thrombosis of arteries of the lower extremities: Secondary | ICD-10-CM

## 2021-09-21 DIAGNOSIS — Z8673 Personal history of transient ischemic attack (TIA), and cerebral infarction without residual deficits: Secondary | ICD-10-CM | POA: Diagnosis not present

## 2021-09-21 DIAGNOSIS — H5462 Unqualified visual loss, left eye, normal vision right eye: Secondary | ICD-10-CM | POA: Diagnosis not present

## 2021-09-21 DIAGNOSIS — M79605 Pain in left leg: Secondary | ICD-10-CM

## 2021-09-21 DIAGNOSIS — F32A Depression, unspecified: Secondary | ICD-10-CM

## 2021-09-21 DIAGNOSIS — Z7951 Long term (current) use of inhaled steroids: Secondary | ICD-10-CM | POA: Diagnosis not present

## 2021-09-21 DIAGNOSIS — Z6831 Body mass index (BMI) 31.0-31.9, adult: Secondary | ICD-10-CM | POA: Diagnosis not present

## 2021-09-21 DIAGNOSIS — I70222 Atherosclerosis of native arteries of extremities with rest pain, left leg: Principal | ICD-10-CM

## 2021-09-21 DIAGNOSIS — Z8249 Family history of ischemic heart disease and other diseases of the circulatory system: Secondary | ICD-10-CM | POA: Diagnosis not present

## 2021-09-21 DIAGNOSIS — I484 Atypical atrial flutter: Secondary | ICD-10-CM | POA: Diagnosis not present

## 2021-09-21 DIAGNOSIS — I70201 Unspecified atherosclerosis of native arteries of extremities, right leg: Secondary | ICD-10-CM

## 2021-09-21 DIAGNOSIS — I739 Peripheral vascular disease, unspecified: Secondary | ICD-10-CM | POA: Diagnosis present

## 2021-09-21 DIAGNOSIS — F329 Major depressive disorder, single episode, unspecified: Secondary | ICD-10-CM | POA: Diagnosis not present

## 2021-09-21 DIAGNOSIS — K219 Gastro-esophageal reflux disease without esophagitis: Secondary | ICD-10-CM

## 2021-09-21 DIAGNOSIS — N189 Chronic kidney disease, unspecified: Secondary | ICD-10-CM | POA: Diagnosis not present

## 2021-09-21 DIAGNOSIS — I480 Paroxysmal atrial fibrillation: Secondary | ICD-10-CM | POA: Diagnosis not present

## 2021-09-21 DIAGNOSIS — I700 Atherosclerosis of aorta: Secondary | ICD-10-CM | POA: Diagnosis not present

## 2021-09-21 DIAGNOSIS — I2510 Atherosclerotic heart disease of native coronary artery without angina pectoris: Secondary | ICD-10-CM

## 2021-09-21 DIAGNOSIS — F1090 Alcohol use, unspecified, uncomplicated: Secondary | ICD-10-CM

## 2021-09-21 DIAGNOSIS — M79662 Pain in left lower leg: Secondary | ICD-10-CM | POA: Diagnosis not present

## 2021-09-21 DIAGNOSIS — I7143 Infrarenal abdominal aortic aneurysm, without rupture: Secondary | ICD-10-CM

## 2021-09-21 DIAGNOSIS — I252 Old myocardial infarction: Secondary | ICD-10-CM | POA: Diagnosis not present

## 2021-09-21 DIAGNOSIS — E669 Obesity, unspecified: Secondary | ICD-10-CM | POA: Diagnosis not present

## 2021-09-21 DIAGNOSIS — I4891 Unspecified atrial fibrillation: Secondary | ICD-10-CM | POA: Diagnosis not present

## 2021-09-21 DIAGNOSIS — I708 Atherosclerosis of other arteries: Secondary | ICD-10-CM

## 2021-09-21 DIAGNOSIS — Z79899 Other long term (current) drug therapy: Secondary | ICD-10-CM | POA: Diagnosis not present

## 2021-09-21 DIAGNOSIS — Z7901 Long term (current) use of anticoagulants: Secondary | ICD-10-CM | POA: Diagnosis not present

## 2021-09-21 DIAGNOSIS — I129 Hypertensive chronic kidney disease with stage 1 through stage 4 chronic kidney disease, or unspecified chronic kidney disease: Secondary | ICD-10-CM | POA: Diagnosis not present

## 2021-09-21 HISTORY — PX: LOWER EXTREMITY ANGIOGRAPHY: CATH118251

## 2021-09-21 LAB — ECHOCARDIOGRAM COMPLETE
AR max vel: 3.47 cm2
AV Area VTI: 3.58 cm2
AV Area mean vel: 3.52 cm2
AV Mean grad: 6 mmHg
AV Peak grad: 12.4 mmHg
Ao pk vel: 1.76 m/s
Area-P 1/2: 3.36 cm2
S' Lateral: 3.8 cm
Single Plane A4C EF: 47.9 %

## 2021-09-21 LAB — BASIC METABOLIC PANEL
Anion gap: 8 (ref 5–15)
Anion gap: 4 — ABNORMAL LOW (ref 5–15)
BUN: 30 mg/dL — ABNORMAL HIGH (ref 8–23)
BUN: 22 mg/dL (ref 8–23)
CO2: 20 mmol/L — ABNORMAL LOW (ref 22–32)
CO2: 22 mmol/L (ref 22–32)
Calcium: 8.8 mg/dL — ABNORMAL LOW (ref 8.9–10.3)
Calcium: 8.3 mg/dL — ABNORMAL LOW (ref 8.9–10.3)
Chloride: 110 mmol/L (ref 98–111)
Chloride: 113 mmol/L — ABNORMAL HIGH (ref 98–111)
Creatinine, Ser: 1.21 mg/dL (ref 0.61–1.24)
Creatinine, Ser: 0.85 mg/dL (ref 0.61–1.24)
GFR, Estimated: >60 mL/min (ref >60)
GFR, Estimated: >60 mL/min (ref >60)
Glucose, Bld: 117 mg/dL — ABNORMAL HIGH (ref 70–99)
Glucose, Bld: 138 mg/dL — ABNORMAL HIGH (ref 70–99)
Potassium: 4.7 mmol/L (ref 3.5–5.1)
Potassium: 4.3 mmol/L (ref 3.5–5.1)
Sodium: 138 mmol/L (ref 135–145)
Sodium: 139 mmol/L (ref 135–145)

## 2021-09-21 LAB — CBC WITH DIFFERENTIAL/PLATELET
Abs Immature Granulocytes: 0.05 10*3/uL (ref 0.00–0.07)
Basophils Absolute: 0.1 10*3/uL (ref 0.0–0.1)
Basophils Relative: 1 %
Eosinophils Absolute: 0 10*3/uL (ref 0.0–0.5)
Eosinophils Relative: 0 %
HCT: 46 % (ref 39.0–52.0)
Hemoglobin: 15.5 g/dL (ref 13.0–17.0)
Immature Granulocytes: 1 %
Lymphocytes Relative: 6 %
Lymphs Abs: 0.7 10*3/uL (ref 0.7–4.0)
MCH: 34.7 pg — ABNORMAL HIGH (ref 26.0–34.0)
MCHC: 33.7 g/dL (ref 30.0–36.0)
MCV: 102.9 fL — ABNORMAL HIGH (ref 80.0–100.0)
Monocytes Absolute: 0.6 10*3/uL (ref 0.1–1.0)
Monocytes Relative: 5 %
Neutro Abs: 9.3 10*3/uL — ABNORMAL HIGH (ref 1.7–7.7)
Neutrophils Relative %: 87 %
Platelets: 199 10*3/uL (ref 150–400)
RBC: 4.47 MIL/uL (ref 4.22–5.81)
RDW: 12.2 % (ref 11.5–15.5)
WBC: 10.6 10*3/uL — ABNORMAL HIGH (ref 4.0–10.5)
nRBC: 0 % (ref 0.0–0.2)

## 2021-09-21 LAB — APTT: aPTT: 29 seconds (ref 24–36)

## 2021-09-21 LAB — TYPE AND SCREEN
ABO/RH(D): A POS
Antibody Screen: NEGATIVE

## 2021-09-21 LAB — CBC
HCT: 39.8 % (ref 39.0–52.0)
Hemoglobin: 13.4 g/dL (ref 13.0–17.0)
MCH: 34.4 pg — ABNORMAL HIGH (ref 26.0–34.0)
MCHC: 33.7 g/dL (ref 30.0–36.0)
MCV: 102.1 fL — ABNORMAL HIGH (ref 80.0–100.0)
Platelets: 171 10*3/uL (ref 150–400)
RBC: 3.9 MIL/uL — ABNORMAL LOW (ref 4.22–5.81)
RDW: 12.4 % (ref 11.5–15.5)
WBC: 8.1 10*3/uL (ref 4.0–10.5)
nRBC: 0 % (ref 0.0–0.2)

## 2021-09-21 LAB — HEPARIN LEVEL (UNFRACTIONATED)
Heparin Unfractionated: <0.1 IU/mL — ABNORMAL LOW (ref 0.30–0.70)
Heparin Unfractionated: 0.58 IU/mL (ref 0.30–0.70)

## 2021-09-21 LAB — PROTIME-INR
INR: 1.1 (ref 0.8–1.2)
Prothrombin Time: 14 seconds (ref 11.4–15.2)

## 2021-09-21 SURGERY — LOWER EXTREMITY ANGIOGRAPHY
Anesthesia: Moderate Sedation | Laterality: Left

## 2021-09-21 MED ORDER — ACETAMINOPHEN 650 MG RE SUPP: 650.0000 mg | Freq: Four times a day (QID) | RECTAL | Status: DC | PRN

## 2021-09-21 MED ORDER — FENTANYL CITRATE (PF) 100 MCG/2ML IJ SOLN
INTRAMUSCULAR | Status: AC
  Filled 2021-09-21: qty 2

## 2021-09-21 MED ORDER — SODIUM CHLORIDE 0.9 % IV SOLN: 250.0000 mL | INTRAVENOUS | Status: DC | PRN

## 2021-09-21 MED ORDER — HEPARIN SODIUM (PORCINE) 1000 UNIT/ML IJ SOLN
INTRAMUSCULAR | Status: DC | PRN
  Administered 2021-09-21: 4000 [IU] via INTRAVENOUS

## 2021-09-21 MED ORDER — MOMETASONE FURO-FORMOTEROL FUM 200-5 MCG/ACT IN AERO
2.0000 | INHALATION_SPRAY | Freq: Two times a day (BID) | RESPIRATORY_TRACT | Status: DC
  Administered 2021-09-21 – 2021-09-26 (×11): 2 via RESPIRATORY_TRACT
  Filled 2021-09-21 (×2): qty 8.8

## 2021-09-21 MED ORDER — APIXABAN 5 MG PO TABS: 5.0000 mg | ORAL_TABLET | Freq: Two times a day (BID) | ORAL | Status: DC

## 2021-09-21 MED ORDER — MORPHINE SULFATE (PF) 4 MG/ML IV SOLN
4.0000 mg | Freq: Once | INTRAVENOUS | Status: AC
  Administered 2021-09-21: 4 mg via INTRAVENOUS
  Filled 2021-09-21: qty 1

## 2021-09-21 MED ORDER — ONDANSETRON HCL 4 MG/2ML IJ SOLN: 4.0000 mg | Freq: Four times a day (QID) | INTRAMUSCULAR | Status: DC | PRN

## 2021-09-21 MED ORDER — HEPARIN (PORCINE) 25000 UT/250ML-% IV SOLN
1800.0000 [IU]/h | INTRAVENOUS | Status: DC
  Administered 2021-09-23 – 2021-09-26 (×6): 1800 [IU]/h via INTRAVENOUS
  Filled 2021-09-21 (×8): qty 250

## 2021-09-21 MED ORDER — ALBUTEROL SULFATE (2.5 MG/3ML) 0.083% IN NEBU
2.5000 mg | INHALATION_SOLUTION | Freq: Four times a day (QID) | RESPIRATORY_TRACT | Status: DC | PRN
  Administered 2021-09-22: 2.5 mg via RESPIRATORY_TRACT
  Filled 2021-09-21: qty 3

## 2021-09-21 MED ORDER — NITROGLYCERIN 1 MG/10 ML FOR IR/CATH LAB
INTRA_ARTERIAL | Status: AC
  Filled 2021-09-21: qty 10

## 2021-09-21 MED ORDER — OXYCODONE HCL 5 MG PO TABS
5.0000 mg | ORAL_TABLET | ORAL | Status: DC | PRN
  Administered 2021-09-21 – 2021-09-23 (×5): 5 mg via ORAL
  Filled 2021-09-21 (×5): qty 1

## 2021-09-21 MED ORDER — SODIUM CHLORIDE 0.9 % IV SOLN: INTRAVENOUS | Status: DC

## 2021-09-21 MED ORDER — CILOSTAZOL 100 MG PO TABS
100.0000 mg | ORAL_TABLET | Freq: Two times a day (BID) | ORAL | Status: DC
  Administered 2021-09-21 – 2021-09-26 (×10): 100 mg via ORAL
  Filled 2021-09-21 (×11): qty 1

## 2021-09-21 MED ORDER — MAGNESIUM HYDROXIDE 400 MG/5ML PO SUSP: 30.0000 mL | Freq: Every day | ORAL | Status: DC | PRN

## 2021-09-21 MED ORDER — ATORVASTATIN CALCIUM 20 MG PO TABS
20.0000 mg | ORAL_TABLET | Freq: Every day | ORAL | Status: DC
  Administered 2021-09-21 – 2021-09-26 (×6): 20 mg via ORAL
  Filled 2021-09-21 (×6): qty 1

## 2021-09-21 MED ORDER — TRAZODONE HCL 50 MG PO TABS
25.0000 mg | ORAL_TABLET | Freq: Every evening | ORAL | Status: DC | PRN
  Administered 2021-09-23 – 2021-09-26 (×3): 25 mg via ORAL
  Filled 2021-09-21 (×3): qty 1

## 2021-09-21 MED ORDER — HEPARIN (PORCINE) 25000 UT/250ML-% IV SOLN
1800.0000 [IU]/h | INTRAVENOUS | Status: DC
  Administered 2021-09-21: 1800 [IU]/h via INTRAVENOUS
  Filled 2021-09-21: qty 250

## 2021-09-21 MED ORDER — MIDAZOLAM HCL 2 MG/2ML IJ SOLN
INTRAMUSCULAR | Status: DC | PRN
  Administered 2021-09-21 (×2): .5 mg via INTRAVENOUS
  Administered 2021-09-21: 1 mg via INTRAVENOUS
  Administered 2021-09-21: .5 mg via INTRAVENOUS

## 2021-09-21 MED ORDER — POTASSIUM CHLORIDE CRYS ER 20 MEQ PO TBCR
20.0000 meq | EXTENDED_RELEASE_TABLET | Freq: Two times a day (BID) | ORAL | Status: DC
  Administered 2021-09-21 – 2021-09-26 (×10): 20 meq via ORAL
  Filled 2021-09-21 (×10): qty 1

## 2021-09-21 MED ORDER — ACETAMINOPHEN 325 MG PO TABS
650.0000 mg | ORAL_TABLET | ORAL | Status: DC | PRN
  Administered 2021-09-23 (×2): 650 mg via ORAL
  Filled 2021-09-21: qty 2

## 2021-09-21 MED ORDER — CEFAZOLIN SODIUM-DEXTROSE 1-4 GM/50ML-% IV SOLN
INTRAVENOUS | Status: AC | PRN
  Administered 2021-09-21: 2 g via INTRAVENOUS

## 2021-09-21 MED ORDER — MIDAZOLAM HCL 2 MG/2ML IJ SOLN
INTRAMUSCULAR | Status: AC
  Filled 2021-09-21: qty 2

## 2021-09-21 MED ORDER — BUPROPION HCL ER (XL) 150 MG PO TB24
300.0000 mg | ORAL_TABLET | Freq: Every day | ORAL | Status: DC
  Administered 2021-09-21 – 2021-09-26 (×6): 300 mg via ORAL
  Filled 2021-09-21 (×6): qty 2

## 2021-09-21 MED ORDER — ONDANSETRON HCL 4 MG PO TABS: 4.0000 mg | ORAL_TABLET | Freq: Four times a day (QID) | ORAL | Status: DC | PRN

## 2021-09-21 MED ORDER — HEPARIN SODIUM (PORCINE) 5000 UNIT/ML IJ SOLN
4000.0000 [IU] | Freq: Once | INTRAMUSCULAR | Status: AC
  Administered 2021-09-21: 4000 [IU] via INTRAVENOUS
  Filled 2021-09-21: qty 1

## 2021-09-21 MED ORDER — MORPHINE SULFATE (PF) 4 MG/ML IV SOLN: 2.0000 mg | INTRAVENOUS | Status: DC | PRN

## 2021-09-21 MED ORDER — METOPROLOL TARTRATE 25 MG PO TABS
25.0000 mg | ORAL_TABLET | Freq: Two times a day (BID) | ORAL | Status: DC
  Administered 2021-09-21 – 2021-09-23 (×4): 25 mg via ORAL
  Filled 2021-09-21 (×4): qty 1

## 2021-09-21 MED ORDER — CEFAZOLIN SODIUM-DEXTROSE 2-4 GM/100ML-% IV SOLN
INTRAVENOUS | Status: AC
  Filled 2021-09-21: qty 100

## 2021-09-21 MED ORDER — ISOSORBIDE MONONITRATE ER 60 MG PO TB24
60.0000 mg | ORAL_TABLET | Freq: Every day | ORAL | Status: DC
  Administered 2021-09-21 – 2021-09-26 (×6): 60 mg via ORAL
  Filled 2021-09-21 (×6): qty 1

## 2021-09-21 MED ORDER — LISINOPRIL 20 MG PO TABS
40.0000 mg | ORAL_TABLET | Freq: Every day | ORAL | Status: DC
  Administered 2021-09-21 – 2021-09-26 (×6): 40 mg via ORAL
  Filled 2021-09-21 (×6): qty 2

## 2021-09-21 MED ORDER — FENTANYL CITRATE PF 50 MCG/ML IJ SOSY
PREFILLED_SYRINGE | INTRAMUSCULAR | Status: AC
  Filled 2021-09-21: qty 1

## 2021-09-21 MED ORDER — SODIUM CHLORIDE 0.9 % IV SOLN: INTRAVENOUS | Status: AC

## 2021-09-21 MED ORDER — SODIUM CHLORIDE 0.9% FLUSH: 3.0000 mL | INTRAVENOUS | Status: DC | PRN

## 2021-09-21 MED ORDER — HEPARIN BOLUS VIA INFUSION
4000.0000 [IU] | Freq: Once | INTRAVENOUS | Status: DC
  Filled 2021-09-21: qty 4000

## 2021-09-21 MED ORDER — NICOTINE 7 MG/24HR TD PT24
7.0000 mg | MEDICATED_PATCH | Freq: Every day | TRANSDERMAL | Status: DC
  Administered 2021-09-21 – 2021-09-26 (×6): 7 mg via TRANSDERMAL
  Filled 2021-09-21 (×7): qty 1

## 2021-09-21 MED ORDER — SODIUM CHLORIDE 0.9% FLUSH
3.0000 mL | Freq: Two times a day (BID) | INTRAVENOUS | Status: DC
  Administered 2021-09-21 – 2021-09-26 (×8): 3 mL via INTRAVENOUS

## 2021-09-21 MED ORDER — PANTOPRAZOLE SODIUM 40 MG PO TBEC
40.0000 mg | DELAYED_RELEASE_TABLET | Freq: Every day | ORAL | Status: DC
  Administered 2021-09-21 – 2021-09-26 (×6): 40 mg via ORAL
  Filled 2021-09-21 (×6): qty 1

## 2021-09-21 MED ORDER — AMLODIPINE BESYLATE 10 MG PO TABS
10.0000 mg | ORAL_TABLET | Freq: Every day | ORAL | Status: DC
  Administered 2021-09-21 – 2021-09-26 (×6): 10 mg via ORAL
  Filled 2021-09-21 (×6): qty 1

## 2021-09-21 MED ORDER — NITROGLYCERIN 1 MG/10 ML FOR IR/CATH LAB
INTRA_ARTERIAL | Status: DC | PRN
  Administered 2021-09-21: 300 ug

## 2021-09-21 MED ORDER — FENTANYL CITRATE (PF) 100 MCG/2ML IJ SOLN
INTRAMUSCULAR | Status: DC | PRN
  Administered 2021-09-21 (×4): 25 ug via INTRAVENOUS

## 2021-09-21 MED ORDER — CEFAZOLIN SODIUM-DEXTROSE 2-4 GM/100ML-% IV SOLN: 2.0000 g | INTRAVENOUS | Status: AC

## 2021-09-21 MED ORDER — FLUOXETINE HCL 20 MG PO CAPS
40.0000 mg | ORAL_CAPSULE | Freq: Every day | ORAL | Status: DC
  Administered 2021-09-21 – 2021-09-26 (×6): 40 mg via ORAL
  Filled 2021-09-21 (×6): qty 2

## 2021-09-21 MED ORDER — ACETAMINOPHEN 325 MG PO TABS
650.0000 mg | ORAL_TABLET | Freq: Four times a day (QID) | ORAL | Status: DC | PRN
  Administered 2021-09-24: 650 mg via ORAL
  Filled 2021-09-21 (×2): qty 2

## 2021-09-21 MED ORDER — HEPARIN SODIUM (PORCINE) 1000 UNIT/ML IJ SOLN
INTRAMUSCULAR | Status: AC
  Filled 2021-09-21: qty 10

## 2021-09-21 MED ORDER — DILTIAZEM HCL ER 60 MG PO CP12
120.0000 mg | ORAL_CAPSULE | Freq: Two times a day (BID) | ORAL | Status: DC
  Administered 2021-09-21 – 2021-09-23 (×4): 120 mg via ORAL
  Filled 2021-09-21 (×5): qty 2

## 2021-09-21 SURGICAL SUPPLY — 19 items
CANISTER PENUMBRA ENGINE (MISCELLANEOUS) ×1 IMPLANT
CATH ANGIO 5F PIGTAIL 65CM (CATHETERS) ×1 IMPLANT
CATH BEACON 5 .035 65 KMP TIP (CATHETERS) ×1 IMPLANT
CATH LIGHTNING 7 XTORQ 130 (CATHETERS) ×1 IMPLANT
CATH TEMPO 5F RIM 65CM (CATHETERS) ×1 IMPLANT
COVER DRAPE FLUORO 36X44 (DRAPES) ×1 IMPLANT
COVER PROBE U/S 5X48 (MISCELLANEOUS) ×1 IMPLANT
DEVICE SAFEGUARD 24CM (GAUZE/BANDAGES/DRESSINGS) ×1 IMPLANT
DEVICE STARCLOSE SE CLOSURE (Vascular Products) ×1 IMPLANT
GLIDEWIRE ADV .035X260CM (WIRE) ×1 IMPLANT
KIT ENCORE 26 ADVANTAGE (KITS) ×1 IMPLANT
NDL ENTRY 21GA 7CM ECHOTIP (NEEDLE) IMPLANT
NEEDLE ENTRY 21GA 7CM ECHOTIP (NEEDLE) ×2 IMPLANT
PACK ANGIOGRAPHY (CUSTOM PROCEDURE TRAY) ×2 IMPLANT
SET INTRO CAPELLA COAXIAL (SET/KITS/TRAYS/PACK) ×1 IMPLANT
SHEATH BRITE TIP 5FRX11 (SHEATH) ×1 IMPLANT
SHEATH PINNACLE MP 7F 45CM (SHEATH) ×1 IMPLANT
WIRE G V18X300 ST (WIRE) ×1 IMPLANT
WIRE GUIDERIGHT .035X150 (WIRE) ×1 IMPLANT

## 2021-09-21 NOTE — Assessment & Plan Note (Addendum)
-   This is associated with left lower extremity ischemia with ABI of 0.4. - The patient will be admitted to a medical telemetry bed. - Pain management to be provided. - He will be placed on Pletal. - We will switch apixaban to IV heparin. - Vascular surgery consult will be obtained. - Dr. Delana Meyer was notified and is aware about the patient.

## 2021-09-21 NOTE — Consult Note (Signed)
@LOGO @   MRN : [id number]  Gregory Lane is a 71 y.o. (07-13-1950) male who presents with chief complaint of check circulation.  History of Present Illness:  I am asked to evaluate the patient by Dr. Tamala Julian.  Patient is a 71 year old gentleman presented the emergency room last night with a complaint of cute onset of pain in his left lower extremity.  Although he has known atherosclerotic occlusive disease he was not following with anyone.  He has a long history of claudication but this was an abrupt change.  In the emergency room evaluation showed significant worsening of his atherosclerotic changes.  He was admitted started on a heparin drip.  No outpatient medications have been marked as taking for the 09/21/21 encounter [facility] Encounter).    Past Medical History:  Diagnosis Date   Aortic atherosclerosis ([place]) 02/16/2017   Arthritis    Blind left eye    Hypertension    Low TSH level 02/17/2017   Myocardial infarction [facility])    Perforated duodenal ulcer ([place])    Stroke [facility])     Past Surgical History:  Procedure Laterality Date   EXPLORATORY LAPAROTOMY  05/12/2015   EYE SURGERY     FRACTURE SURGERY     LAPAROTOMY N/A 05/10/2015   Procedure: EXPLORATORY LAPAROTOMY;  Surgeon: Ralene Ok, MD;  Location: [place];  Service: General;  Laterality: N/A;   LEFT HEART CATH AND CORONARY ANGIOGRAPHY N/A 05/02/2016   Procedure: Left Heart Cath and Coronary Angiography;  Surgeon: Adrian Prows, MD;  Location: [place] [facility];  Service: Cardiovascular;  Laterality: N/A;    Social History Social History   Tobacco Use   Smoking status: Every Day    Packs/day: 1.00    Years: 60.00    Total pack years: 60.00    Types: Cigarettes   Smokeless tobacco: Never  Substance Use Topics   Alcohol use: No    Alcohol/week: 60.0 standard drinks of alcohol    Types: 60 Standard drinks or equivalent per week    Comment: daily 12 pk of beer   Drug use: No    Family  History Family History  Problem Relation Age of Onset   Cirrhosis Father    Hypertension Mother    Lung cancer Sister    Hypertension Sister    Arthritis Sister    Heart murmur Sister     No Known Allergies   REVIEW OF SYSTEMS (Negative unless checked)  Constitutional: [] Weight loss  [] Fever  [] Chills Cardiac: [] Chest pain   [] Chest pressure   [] Palpitations   [] Shortness of breath when laying flat   [] Shortness of breath with exertion. Vascular:  [x] Pain in legs with walking   [x] Pain in legs at rest  [] History of DVT   [] Phlebitis   [] Swelling in legs   [] Varicose veins   [] Non-healing ulcers Pulmonary:   [] Uses home oxygen   [] Productive cough   [] Hemoptysis   [] Wheeze  [] COPD   [] Asthma Neurologic:  [] Dizziness   [] Seizures   [] History of stroke   [] History of TIA  [] Aphasia   [] Vissual changes   [] Weakness or numbness in arm   [] Weakness or numbness in leg Musculoskeletal:   [] Joint swelling   [] Joint pain   [] Low back pain Hematologic:  [] Easy bruising  [] Easy bleeding   [] Hypercoagulable state   [] Anemic Gastrointestinal:  [] Diarrhea   [] Vomiting  [] Gastroesophageal reflux/heartburn   [] Difficulty swallowing. Genitourinary:  [] Chronic kidney  disease   [] Difficult urination  [] Frequent urination   [] Blood in urine Skin:  [] Rashes   [] Ulcers  Psychological:  [] History of anxiety   []  History of major depression.  Physical Examination  Vitals:   09/21/21 0600 09/21/21 0630 09/21/21 0700 09/21/21 0747  BP: (!) 177/103 (!) 171/80 (!) 153/75 (!) 181/73  Pulse: (!) 57 (!) 44 (!) 52   Resp:    16  Temp:      TempSrc:      SpO2: 93% 98% 93% 96%  Weight:      Height:       Body mass index is 31.41 kg/m. Gen: WD/WN, NAD Head: [place]/AT, No temporalis wasting.  Ear/Nose/Throat: Hearing grossly intact, nares w/o erythema or drainage Eyes: PER, EOMI, sclera nonicteric.  Neck: Supple, no masses.  No bruit or JVD.  Pulmonary:  Good air movement, no audible wheezing, no use of  accessory muscles.  Cardiac: RRR, normal S1, S2, no Murmurs. Vascular:  mild trophic changes, no open wounds foot is cool to the touch with very sluggish capillary refill on the left Vessel Right Left  Radial Palpable Palpable  PT Not Palpable Not Palpable  DP Not Palpable Not Palpable  Gastrointestinal: soft, non-distended. No guarding/no peritoneal signs.  Musculoskeletal: M/S 5/5 throughout.  No visible deformity.  Neurologic: CN 2-12 intact. Pain and light touch intact in extremities.  Symmetrical.  Speech is fluent. Motor exam as listed above. Psychiatric: Judgment intact, Mood & affect appropriate for pt's clinical situation. Dermatologic: No rashes or ulcers noted.  No changes consistent with cellulitis.   CBC Lab Results  Component Value Date   WBC 10.6 (H) 09/21/2021   HGB 15.5 09/21/2021   HCT 46.0 09/21/2021   MCV 102.9 (H) 09/21/2021   PLT 199 09/21/2021    BMET    Component Value Date/Time   NA 138 09/21/2021 0254   K 4.7 09/21/2021 0254   CL 110 09/21/2021 0254   CO2 20 (L) 09/21/2021 0254   GLUCOSE 117 (H) 09/21/2021 0254   BUN 30 (H) 09/21/2021 0254   CREATININE 1.21 09/21/2021 0254   CREATININE 0.91 12/19/2019 1114   CALCIUM 8.8 (L) 09/21/2021 0254   GFRNONAA >60 09/21/2021 0254   GFRNONAA 84 12/19/2019 1114   GFRAA 98 12/19/2019 1114   Estimated Creatinine Clearance: 70.1 mL/min (by C-G formula based on SCr of 1.21 mg/dL).  COAG Lab Results  Component Value Date   INR 1.1 09/21/2021   INR 1.00 02/16/2017   INR 1.12 05/10/2015    Radiology US Venous Img Lower Unilateral Left  Result Date: 09/21/2021 CLINICAL DATA:  Left lower extremity pain. EXAM: Left LOWER EXTREMITY VENOUS DOPPLER ULTRASOUND TECHNIQUE: Gray-scale sonography with compression, as well as color and duplex ultrasound, were performed to evaluate the deep venous system(s) from the level of the common femoral vein through the popliteal and proximal calf veins. COMPARISON:  None  Available. FINDINGS: VENOUS Normal compressibility of the common femoral, superficial femoral, and popliteal veins, as well as the visualized calf veins. Visualized portions of profunda femoral vein and great saphenous vein unremarkable. No filling defects to suggest DVT on grayscale or color Doppler imaging. Doppler waveforms show normal direction of venous flow, normal respiratory plasticity and response to augmentation. Limited views of the contralateral common femoral vein are unremarkable. OTHER None. Limitations: none IMPRESSION: Negative. Electronically Signed   By: Anner Crete M.D.   On: 09/21/2021 01:30   CT Head Wo Contrast  Result Date: 09/06/2021 CLINICAL DATA:  Right arm weakness difficulty walking EXAM: CT HEAD WITHOUT CONTRAST TECHNIQUE: Contiguous axial images were obtained from the base of the skull through the vertex without intravenous contrast. RADIATION DOSE REDUCTION: This exam was performed according to the departmental dose-optimization program which includes automated exposure control, adjustment of the mA and/or kV according to patient size and/or use of iterative reconstruction technique. COMPARISON:  CT brain 08/02/2020 FINDINGS: Brain: No acute territorial infarction, hemorrhage or intracranial mass. Encephalomalacia in the right temporal lobe and right greater than left inferior frontal lobes. Atrophy and chronic small vessel ischemic changes of the white matter. Stable ventricle size. Vascular: No hyperdense vessels.  Carotid vascular calcification. Skull: Normal. Negative for fracture or focal lesion. Sinuses/Orbits: No acute finding. Other: None IMPRESSION: 1. No CT evidence for acute intracranial abnormality. 2. Atrophy and chronic small vessel ischemic changes of the white matter. Encephalomalacia within the right temporal lobe and bilateral frontal lobes. Electronically Signed   By: Donavan Foil M.D.   On: 09/06/2021 15:30   DG Chest 2 View  Result Date:  09/06/2021 CLINICAL DATA:  Dyspnea EXAM: CHEST - 2 VIEW COMPARISON:  08/02/2020 chest radiograph. FINDINGS: Stable cardiomediastinal silhouette with normal heart size. No pneumothorax. No pleural effusion. Lungs appear clear, with no acute consolidative airspace disease and no pulmonary edema. IMPRESSION: No active cardiopulmonary disease. Electronically Signed   By: Ilona Sorrel M.D.   On: 09/06/2021 13:52     Assessment/Plan Atherosclerotic occlusive disease bilateral lower extremities with rest pain of the left lower extremity: Recommend:  The patient has evidence of severe atherosclerotic changes of both lower extremities with rest pain that is associated with preulcerative changes and impending tissue loss of the left foot.  This represents a limb threatening ischemia and places the patient at the risk for left limb loss.  Patient should undergo angiography of the left lower extremity with the hope for intervention for limb salvage.  The risks and benefits as well as the alternative therapies was discussed in detail with the patient.  All questions were answered.  Patient agrees to proceed with left lower extremity angiography.  The patient will follow up with me in the office after the procedure.  Coronary artery disease: Continue cardiac and antihypertensive medications as already ordered and reviewed, no changes at this time.  Continue statin as ordered and reviewed, no changes at this time  Nitrates PRN for chest pain   Hypertension: Continue antihypertensive medications as already ordered, these medications have been reviewed and there are no changes at this time.         [name], MD  09/21/2021 7:53 AM

## 2021-09-21 NOTE — Progress Notes (Signed)
ANTICOAGULATION CONSULT NOTE - Initial Consult  Pharmacy Consult for Heparin  Indication: DVT  No Known Allergies  Patient Measurements: Height: 6\' 1"  (185.4 cm) Weight: 108 kg (238 lb 1.6 oz) IBW/kg (Calculated) : 79.9 Heparin Dosing Weight: 102.3 kg   Vital Signs: Temp: 98.2 F (36.8 C) (08/01 2332) Temp Source: Oral (08/01 2332) BP: 165/95 (08/01 2332) Pulse Rate: 92 (08/01 2332)  Labs: Recent Labs    09/21/21 0254  HGB 15.5  HCT 46.0  PLT 199  APTT 29  LABPROT 14.0  INR 1.1  CREATININE 1.21    Estimated Creatinine Clearance: 70.1 mL/min (by C-G formula based on SCr of 1.21 mg/dL).   Medical History: Past Medical History:  Diagnosis Date   Aortic atherosclerosis ([place]) 02/16/2017   Arthritis    Blind left eye    Hypertension    Low TSH level 02/17/2017   Myocardial infarction [facility])    Perforated duodenal ulcer ([place])    Stroke [facility])     Medications:  (Not in a hospital admission)   Assessment: Pharmacy consulted to dose heparin in this 71 year old male admitted with limb ischemia.  PTA med list says pt is on Eliquis 5 mg PO BID,  however pt states he never had rx filled b/c it was too expensive.   Will draw baseline HL to confirm.  CrCl = 70.1 ml/min   Goal of Therapy:  Heparin level 0.3-0.7 units/ml Monitor platelets by anticoagulation protocol: Yes   Plan:  Give 4000 units bolus x 1 Start heparin infusion at 1800 units/hr Check anti-Xa level in 8 hours and daily while on heparin Continue to monitor H&H and platelets  Zareen Jamison D 09/21/2021,4:28 AM

## 2021-09-21 NOTE — Progress Notes (Signed)
Per patient- unable to afford all his medications.

## 2021-09-21 NOTE — Op Note (Signed)
[facility]  Percutaneous Study/Intervention Procedural Note   Date of Surgery: 09/21/2021,2:21 PM  Surgeon:Million Maharaj, Dolores Lory   Pre-operative Diagnosis: Atherosclerotic occlusive disease bilateral lower extremities with rest pain and ischemic changes of the left foot  Post-operative diagnosis:  Same  Procedure(s) Performed:  1.  Abdominal aortogram  2.  Left lower extremity distal runoff third order catheter placement with additional third order  3.  Mechanical thrombectomy left SFA with the penumbra CAT 7 device  4.  Mechanical thrombectomy of the left profunda femoris with the penumbra CAT 7 device  5.  Mechanical thrombectomy of the distal left common femoral artery with the penumbra CAT 7 device  6.  StarClose right common femoral artery    Anesthesia: Conscious sedation was administered by the interventional radiology RN under my direct supervision. IV Versed plus fentanyl were utilized. Continuous ECG, pulse oximetry and blood pressure was monitored throughout the entire procedure. Conscious sedation was administered for a total of 1 hour 4 minutes.  Sheath: 7 [place] destination right common femoral retrograde  Contrast: 70 cc   Fluoroscopy Time: 7.8 minutes  Indications: Patient presented to the emergency room early in the morning with ischemic changes to his left foot.  Noninvasive studies as well as physical examination confirmed compromised arterial perfusion.  Risks and benefits for angiography with the hope for intervention for limb salvage were reviewed all questions been answered patient has agreed to proceed.  Procedure:  Gregory Lane a 71 y.o. male who was identified and appropriate procedural time out was performed.  The patient was then placed supine on the table and prepped and draped in the usual sterile fashion.  Ultrasound was used to evaluate the right common femoral artery.  It was echolucent and pulsatile indicating it is patent .   An ultrasound image was acquired for the permanent record.  A micropuncture needle was used to access the right common femoral artery under direct ultrasound guidance.  The microwire was then advanced under fluoroscopic guidance without difficulty followed by the micro-sheath.  A 0.035 J wire was advanced without resistance and a 5Fr sheath was placed.    Pigtail catheter was advanced to the level of T12 and AP projection of the abdominal aorta was obtained.  Pigtail catheter was repositioned to above the bifurcation and bilateral oblique views of the pelvis were obtained.  Using a rim catheter and advantage wire was then used to cross the aortic bifurcation and the wire was negotiated down into the common femoral artery on the left.  Rim catheter was exchanged for a pigtail catheter.  LAO projection of the right common femoral artery was then obtained.  This demonstrated occlusion of the distal common femoral as well as the SFA and occlusion of the profunda femoris.  Profunda femoris appeared to reconstitute approximately 4 to 5 cm distal to the common femoral there is no evidence initially of reconstitution of the SFA.  6000 units of heparin was given and allowed to circulate.  The advantage wire was then reintroduced into the pigtail catheter and a 7 [place] destination sheath was advanced up and over the bifurcation position with its tip in the common femoral artery proximally.  Magnified LAO projection of the common femoral was then performed to use as a roadmap.  Using a Kumpe catheter and the advantage wire was able to cross the occlusion of the SFA and advanced the Kumpe catheter.  Hand-injection contrast demonstrated patency of the SFA from from approximately 10 cm  distal to the origin onward.  At this point I was convinced this is an embolic event and not atherosclerotic changes.  Therefore the penumbra CAT seven 135 cm length catheter was prepped on the field and advancing the penumbra over the  wire I performed mechanical thrombectomy of the distal common femoral and the entire length of the SFA occlusion proximally.  Multiple passes were used some of them were performed without the wire in position.  Marked improvement in flow was noted with a small volume of residual thrombus but persistent occlusion of the profunda femoris after the multiple passes and I elected to treat the profunda femoris in a similar fashion.  The CAT 7 catheter was removed and a Kumpe catheter in combination with the advantage wire was negotiated into the profunda femoris were hand-injection through the Kumpe demonstrated patency of the distal profunda and intraluminal positioning.  The wire was reintroduced and the CAT 7 was then advanced over the wire and mechanical thrombectomy was performed of the profunda femoris.  Again multiple passes were performed some of which were without the wire.  After several passes with rapid flow of blood through the catheter I elected to reimage the femoral bifurcation.  Hand-injection contrast through the sheath demonstrated wide patency of the common femoral as well as the visualized portions of the SFA and profunda femoris.  There was now essentially total resolution of the thrombus.  I then performed distal runoff which demonstrated patency of the SFA and popliteal as well as the trifurcation with three-vessel runoff to the foot.  It also demonstrated patency of the profunda femoris down to the distal thigh.  Having successfully evacuated the embolism attention was turned to the right groin where an RAO projection was obtained and then a Star close device deployed.  Findings:   Aortogram: The abdominal aorta is opacified with a bolus injection contrast.  The walls of the aorta are fairly calcified but there are no hemodynamically significant stenoses or strictures noted.  There is a small distal aneurysm of the infrarenal abdominal aorta.  Similarly, the bilateral common iliac arteries  as well as the external iliac arteries appear to be mildly diseased but there are no hemodynamically significant stenoses on the left.  On the right the common iliac artery is widely patent the external iliac artery demonstrates a 65 to 70% stenosis just proximal to the level of the ileal inguinal ligament.  Right Lower Extremity: The right common femoral demonstrates 30 to 40% diffuse stenosis.  The visualized portions of the SFA and profunda femoris demonstrate moderate atherosclerotic changes.  Left Lower Extremity: The left common femoral is patent in its proximal two thirds again mild atherosclerotic changes are noted.  There is an abrupt cut off with what appears to be mobile thrombus noted on the angiogram beginning in the distal one third of the common femoral.  This extends into the profunda femoris and SFA as described above.  SFA is reconstituted by 10 cm distally.  The profunda femoris is occluded proximally and then reconstitutes about 4 to 5 cm distally in a similar appearance to the SFA.  There is a moderate 40 to 50% stenosis at Hunter's canal in the SFA with diffuse calcific disease that is not significant throughout.  Popliteal is similar with diffuse disease as is the trifurcation.  There appears to be three-vessel runoff to the foot.  Following mechanical thrombectomy with selection of both the SFA as well as the profunda femoris there is now essentially a  little total elimination of the embolism.  There is restoration of flow and preservation of three-vessel runoff to the left foot  Disposition: Patient was taken to the recovery room in stable condition having tolerated the procedure well.  Gregory Lane 09/21/2021,2:21 PM

## 2021-09-21 NOTE — Progress Notes (Signed)
[facility] for IV heparin Indication: arterial embolism of the left leg  No Known Allergies  Patient Measurements: Height: 6\' 1"  (185.4 cm) Weight: 108 kg (238 lb 1.6 oz) IBW/kg (Calculated) : 79.9 Heparin Dosing Weight: 102.3 kg  Vital Signs: Temp: 98.2 F (36.8 C) (08/01 2332) Temp Source: Oral (08/01 2332) BP: 136/73 (08/02 0929) Pulse Rate: 43 (08/02 0929)  Labs: Recent Labs    09/21/21 0254  HGB 15.5  HCT 46.0  PLT 199  APTT 29  LABPROT 14.0  INR 1.1  HEPARINUNFRC <0.10*  CREATININE 1.21    Estimated Creatinine Clearance: 70.1 mL/min (by C-G formula based on SCr of 1.21 mg/dL).   Medical History: Past Medical History:  Diagnosis Date   Aortic atherosclerosis ([place]) 02/16/2017   Arthritis    Blind left eye    Hypertension    Low TSH level 02/17/2017   Myocardial infarction ([place])    Perforated duodenal ulcer ([place])    Stroke ([facility])     Medications:  Medications Prior to Admission  Medication Sig Dispense Refill Last Dose   albuterol (PROVENTIL HFA;VENTOLIN HFA) 108 (90 Base) MCG/ACT inhaler Inhale 2 puffs into the lungs every 6 (six) hours as needed for wheezing or shortness of breath. 1 Inhaler 1    amLODipine (NORVASC) 10 MG tablet Take 1 tablet (10 mg total) by mouth daily at 2 PM. 90 tablet 3    apixaban (ELIQUIS) 5 MG TABS tablet Take 1 tablet (5 mg total) by mouth 2 (two) times daily. 60 tablet 6    atorvastatin (LIPITOR) 40 MG tablet Take 0.5 tablets (20 mg total) by mouth daily. 90 tablet 3    budesonide-formoterol (SYMBICORT) 160-4.5 MCG/ACT inhaler TAKE 2 PUFFS BY MOUTH TWICE A DAY 1 each 5    buPROPion (WELLBUTRIN XL) 150 MG 24 hr tablet Take 2 tablets (300 mg total) by mouth daily. 90 tablet 1    diltiazem (CARDIZEM SR) 120 MG 12 hr capsule Take 1 capsule (120 mg total) by mouth every 12 (twelve) hours. 180 capsule 3    FLUoxetine (PROZAC) 20 MG capsule Take 2 capsules (40 mg total) by mouth daily. 60 capsule  0    furosemide (LASIX) 20 MG tablet Take 1 tablet (20 mg total) by mouth daily. 90 tablet 3    isosorbide mononitrate (IMDUR) 60 MG 24 hr tablet Take 60 mg by mouth daily.      lisinopril (ZESTRIL) 40 MG tablet Take 1 tablet (40 mg total) by mouth daily. 90 tablet 3    metoprolol tartrate (LOPRESSOR) 25 MG tablet Take 1 tablet (25 mg total) by mouth 2 (two) times daily. 180 tablet 3    pantoprazole (PROTONIX) 40 MG tablet Take 1 tablet (40 mg total) by mouth daily. 90 tablet 3    potassium chloride SA (KLOR-CON M) 20 MEQ tablet Take 1 tablet (20 mEq total) by mouth 2 (two) times daily. 180 tablet 3    Scheduled:   [MAR Hold] amLODipine  10 mg Oral Q1400   [MAR Hold] atorvastatin  20 mg Oral Daily   [MAR Hold] buPROPion  300 mg Oral Daily   [MAR Hold] cilostazol  100 mg Oral BID   [MAR Hold] diltiazem  120 mg Oral Q12H   fentaNYL       [MAR Hold] FLUoxetine  40 mg Oral Daily   heparin sodium (porcine)       [MAR Hold] isosorbide mononitrate  60 mg Oral Daily   [MAR  Hold] lisinopril  40 mg Oral Daily   [MAR Hold] metoprolol tartrate  25 mg Oral BID   midazolam       midazolam       [MAR Hold] mometasone-formoterol  2 puff Inhalation BID   [MAR Hold] pantoprazole  40 mg Oral Daily   [MAR Hold] potassium chloride SA  20 mEq Oral BID   sodium chloride flush  3 mL Intravenous Q12H   Infusions:   sodium chloride 100 mL/hr at 09/21/21 0532   sodium chloride 100 mL/hr at 09/21/21 0945   sodium chloride      ceFAZolin (ANCEF) IV     heparin      PTA Anticoagulation: PTA med list says pt is on Eliquis 5 mg PO BID,  however pt states he never had rx filled b/c it was too expensive.   Assessment: 71 year old male admitted with left lower leg arterial embolism. Now s/p lower extremity angiography. Pharmacy consulted to re-start heparin.  CrCl = 70.1 ml/min  Baseline HL < 0.10. Baseline CBC stable.   Goal of Therapy:  Heparin level 0.3-0.7 units/ml Monitor platelets by anticoagulation  protocol: Yes   Plan: Heparin timed to restart @1030 .  Restart heparin 1800 units/hr. No bolus. Follow nomogram thereafter. 8-hour heparin level.  Continue to monitor H&H and platelets.  Glean Salvo, PharmD Clinical Pharmacist  09/21/2021 9:52 AM\

## 2021-09-21 NOTE — ED Provider Notes (Signed)
[facility] Provider Note    Event Date/Time   First MD Initiated Contact with Patient 09/21/21 [phone]     (approximate)   History   Leg Pain   HPI  Gregory Lane is a 71 y.o. male who presents to the ED for evaluation of Leg Pain   I review clinic visit from 7/18. Aflutter on eliquis, HTN , HLD, CVA, COPD.   Patient presents to the ED for evaluation of acute on chronic left leg pain.  He reports a degree of chronic pain, frequently with ambulating, that improves with rest.  Reports the pain got worse tonight around 7 PM on 8/1 and has been persistent since that time.  Denies falls, trauma or injuries.  No chest pain, shortness of breath or fever.  No systemic symptoms, just pain to the left leg.   Physical Exam   Triage Vital Signs: ED Triage Vitals  Enc Vitals Group     BP 09/20/21 2332 (!) 165/95     Pulse Rate 09/20/21 2332 92     Resp 09/20/21 2332 18     Temp 09/20/21 2332 98.2 F (36.8 C)     Temp Source 09/20/21 2332 Oral     SpO2 09/20/21 2332 98 %     Weight 09/20/21 2330 238 lb 1.6 oz (108 kg)     Height 09/20/21 2330 6\' 1"  (1.854 m)     Head Circumference --      Peak Flow --      Pain Score 09/20/21 2330 10     Pain Loc --      Pain Edu? --      Excl. in [place]? --     Most recent vital signs: Vitals:   09/20/21 2332  BP: (!) 165/95  Pulse: 92  Resp: 18  Temp: 98.2 F (36.8 C)  SpO2: 98%    General: Awake, no distress.  CV:  Left leg is cool to the touch and I am unable to palpate a DP pulse confidently.  Capillary refill is delayed. Resp:  Normal effort.  Abd:  No distention.  MSK:  No deformity noted.  No signs of trauma.  Neuro:  No focal deficits appreciated. Other:     ED Results / Procedures / Treatments   Labs (all labs ordered are listed, but only abnormal results are displayed) Labs Reviewed  CBC WITH DIFFERENTIAL/PLATELET - Abnormal; Notable for the following components:      Result Value   WBC 10.6  (*)    MCV 102.9 (*)    MCH 34.7 (*)    Neutro Abs 9.3 (*)    All other components within normal limits  BASIC METABOLIC PANEL - Abnormal; Notable for the following components:   CO2 20 (*)    Glucose, Bld 117 (*)    BUN 30 (*)    Calcium 8.8 (*)    All other components within normal limits  HEPARIN LEVEL (UNFRACTIONATED) - Abnormal; Notable for the following components:   Heparin Unfractionated <0.10 (*)    All other components within normal limits  PROTIME-INR  APTT  BASIC METABOLIC PANEL  CBC  TYPE AND SCREEN  TYPE AND SCREEN    EKG   RADIOLOGY Venous ultrasound left leg interpreted by me without evidence of DVT  Official radiology report(s): US Venous Img Lower Unilateral Left  Result Date: 09/21/2021 CLINICAL DATA:  Left lower extremity pain. EXAM: Left LOWER EXTREMITY VENOUS DOPPLER ULTRASOUND TECHNIQUE: Gray-scale sonography with compression,  as well as color and duplex ultrasound, were performed to evaluate the deep venous system(s) from the level of the common femoral vein through the popliteal and proximal calf veins. COMPARISON:  None Available. FINDINGS: VENOUS Normal compressibility of the common femoral, superficial femoral, and popliteal veins, as well as the visualized calf veins. Visualized portions of profunda femoral vein and great saphenous vein unremarkable. No filling defects to suggest DVT on grayscale or color Doppler imaging. Doppler waveforms show normal direction of venous flow, normal respiratory plasticity and response to augmentation. Limited views of the contralateral common femoral vein are unremarkable. OTHER None. Limitations: none IMPRESSION: Negative. Electronically Signed   By: Anner Crete M.D.   On: 09/21/2021 01:30    PROCEDURES and INTERVENTIONS:  .Critical Care  Performed by: Vladimir Crofts, MD Authorized by: Vladimir Crofts, MD   Critical care provider statement:    Critical care time (minutes):  30   Critical care time was exclusive  of:  Separately billable procedures and treating other patients   Critical care was necessary to treat or prevent imminent or life-threatening deterioration of the following conditions:  Circulatory failure   Critical care was time spent personally by me on the following activities:  Development of treatment plan with patient or surrogate, discussions with consultants, evaluation of patient's response to treatment, examination of patient, ordering and review of laboratory studies, ordering and review of radiographic studies, ordering and performing treatments and interventions, pulse oximetry, re-evaluation of patient's condition and review of old charts   Medications  heparin ADULT infusion 100 units/mL (25000 units/278mL) (1,800 Units/hr Intravenous New Bag/Given 09/21/21 0530)  amLODipine (NORVASC) tablet 10 mg (has no administration in time range)  atorvastatin (LIPITOR) tablet 20 mg (has no administration in time range)  diltiazem (CARDIZEM SR) 12 hr capsule 120 mg (has no administration in time range)  isosorbide mononitrate (IMDUR) 24 hr tablet 60 mg (has no administration in time range)  lisinopril (ZESTRIL) tablet 40 mg (has no administration in time range)  metoprolol tartrate (LOPRESSOR) tablet 25 mg (has no administration in time range)  buPROPion (WELLBUTRIN XL) 24 hr tablet 300 mg (has no administration in time range)  FLUoxetine (PROZAC) capsule 40 mg (has no administration in time range)  pantoprazole (PROTONIX) EC tablet 40 mg (has no administration in time range)  potassium chloride SA (KLOR-CON M) CR tablet 20 mEq (has no administration in time range)  albuterol (PROVENTIL) (2.5 MG/3ML) 0.083% nebulizer solution 2.5 mg (has no administration in time range)  mometasone-formoterol (DULERA) 200-5 MCG/ACT inhaler 2 puff (has no administration in time range)  0.9 %  sodium chloride infusion ( Intravenous New Bag/Given 09/21/21 0532)  acetaminophen (TYLENOL) tablet 650 mg (has no  administration in time range)    Or  acetaminophen (TYLENOL) suppository 650 mg (has no administration in time range)  traZODone (DESYREL) tablet 25 mg (has no administration in time range)  magnesium hydroxide (MILK OF MAGNESIA) suspension 30 mL (has no administration in time range)  ondansetron (ZOFRAN) tablet 4 mg (has no administration in time range)    Or  ondansetron (ZOFRAN) injection 4 mg (has no administration in time range)  oxyCODONE-acetaminophen (PERCOCET/ROXICET) 5-325 MG per tablet 1 tablet (1 tablet Oral Given 09/20/21 2336)  morphine (PF) 4 MG/ML injection 4 mg (4 mg Intravenous Given 09/21/21 0340)  heparin injection 4,000 Units (4,000 Units Intravenous Given 09/21/21 0425)     IMPRESSION / MDM / [place] / ED COURSE  I reviewed the triage vital signs  and the nursing notes.  Differential diagnosis includes, but is not limited to, DVT, cellulitis, arterial ischemia, fracture, electrolyte derangement  {Patient presents with symptoms of an acute illness or injury that is potentially life-threatening.  71 year old lifelong smoker presents with acute on chronic left leg pain and evidence of leg ischemia requiring heparinization and medical admission.  Left leg is cool to touch, is poor cap refill and signs of ischemia.  ABI is quite low as well further suggestive of arterial disease.  DVT ultrasound is negative and he has no trauma and I doubt x-ray would elucidate much.  Blood work is fairly unrevealing.  Reassuring electrolytes and hematologic panel.  I consulted with vascular surgery, as below, who agrees with heparinization and medical admission for planned angiography later today  Clinical Course as of 09/21/21 0545  Wed Sep 21, 2021  0346 reassessed [DS]  0400 Left-sided ABI of 0.43. [DS]  6073 I consult with vascular surgery, Dr. Delana Meyer, who agrees with heparin infusion and hospitalist admission.  Will see in consultation in the morning for angiography [DS]     Clinical Course User Index [DS] Vladimir Crofts, MD     FINAL CLINICAL IMPRESSION(S) / ED DIAGNOSES   Final diagnoses:  Critical limb ischemia of left lower extremity ([place])  Left leg pain     Rx / DC Orders   ED Discharge Orders     None        Note:  This document was prepared using Dragon voice recognition software and may include unintentional dictation errors.   Vladimir Crofts, MD 09/21/21 [phone]

## 2021-09-21 NOTE — Progress Notes (Signed)
Cross Cover Patient requested nicotine patch for his nicotine addiction. Considering his PAD, only low dose patch ordered

## 2021-09-21 NOTE — Assessment & Plan Note (Signed)
-   We will continue Lopressor and Imdur.

## 2021-09-21 NOTE — Interval H&P Note (Signed)
History and Physical Interval Note:  09/21/2021 8:05 AM  Gregory Lane  has presented today for surgery, with the diagnosis of atherosclerosis with rest pain.  The various methods of treatment have been discussed with the patient and family. After consideration of risks, benefits and other options for treatment, the patient has consented to  Procedure(s): Lower Extremity Angiography (Left) as a surgical intervention.  The patient's history has been reviewed, patient examined, no change in status, stable for surgery.  I have reviewed the patient's chart and labs.  Questions were answered to the patient's satisfaction.     [name]

## 2021-09-21 NOTE — Progress Notes (Signed)
PT refused CPAP after setup, no explanation given. PT requested O2 [place] instead. 2L set up and placed on PT. CPAP on standby.

## 2021-09-21 NOTE — Assessment & Plan Note (Signed)
-   We will continue his inhalers.

## 2021-09-21 NOTE — ED Notes (Signed)
Right upper ext BP: 124/58, Left upper ext BP: 132/58 Left lower ext BP: 57/45, Right lower: unable to obtain

## 2021-09-21 NOTE — Care Plan (Signed)
This 71 years old male with PMH significant for chronic kidney disease, ongoing tobacco abuse, CVA, atrial flutter on Eliquis, dyslipidemia, COPD, osteoarthritis who presented in the ED with acute onset of left leg pain that started 4 hours before he arrived to the ED.  Patient has taken ibuprofen without any improvement.  Imaging in the ED:  lower extremity venous duplex negative for DVT.  Patient has high ABI and was noted to have diminished capillary refill.  Vascular surgery was consulted.  Patient was scheduled for angiogram today.  Patient was seen post procedure,  tolerated well.

## 2021-09-21 NOTE — ED Notes (Signed)
Pt came up to the front desk stating "can I have a percocet" - the pt was informed that he had already received one in which he replied "Oh yeah, I forgot" and had his wife wheel him back to the seating area.

## 2021-09-21 NOTE — Consult Note (Signed)
CARDIOLOGY CONSULT NOTE               Patient ID: Gregory Lane MRN: [id number] DOB/AGE: 1950/11/08 71 y.o.  Admit date: 09/21/2021 Referring Physician Dr. Eugenie Norrie hospitalist Primary Physician Dr. Rebecka Apley primary Primary Cardiologist  Reason for Consultation atrial fibrillation with rapid ventricular response embolism to lower extremity with occlusion and acute limb ischemia secondary to emboli from left thought to be cardiac or atrial flutter related patient was seen and taken directly to the OR for thrombectomy by Dr. Delana Meyer with good results patient states his pain is improved and was transferred back to telemetry floor evaluation patient states the pain is somewhat better but still requires additional pain medication denies any chest pain has had some mild palpitations and tachycardia no blackout spells or syncope  Review of systems complete and found to be negative unless listed above     Past Medical History:  Diagnosis Date   Aortic atherosclerosis ([place]) 02/16/2017   Arthritis    Blind left eye    Hypertension    Low TSH level 02/17/2017   Myocardial infarction ([place])    Perforated duodenal ulcer ([place])    Stroke [facility])     Past Surgical History:  Procedure Laterality Date   EXPLORATORY LAPAROTOMY  05/12/2015   EYE SURGERY     FRACTURE SURGERY     LAPAROTOMY N/A 05/10/2015   Procedure: EXPLORATORY LAPAROTOMY;  Surgeon: Ralene Ok, MD;  Location: [facility];  Service: General;  Laterality: N/A;   LEFT HEART CATH AND CORONARY ANGIOGRAPHY N/A 05/02/2016   Procedure: Left Heart Cath and Coronary Angiography;  Surgeon: Adrian Prows, MD;  Location: [facility];  Service: Cardiovascular;  Laterality: N/A;    Medications Prior to Admission  Medication Sig Dispense Refill Last Dose   albuterol (PROVENTIL HFA;VENTOLIN HFA) 108 (90 Base) MCG/ACT inhaler Inhale 2 puffs into the lungs every 6 (six) hours as needed for wheezing or shortness of breath. 1 Inhaler 1     amLODipine (NORVASC) 10 MG tablet Take 1 tablet (10 mg total) by mouth daily at 2 PM. 90 tablet 3    apixaban (ELIQUIS) 5 MG TABS tablet Take 1 tablet (5 mg total) by mouth 2 (two) times daily. 60 tablet 6    atorvastatin (LIPITOR) 40 MG tablet Take 0.5 tablets (20 mg total) by mouth daily. 90 tablet 3    budesonide-formoterol (SYMBICORT) 160-4.5 MCG/ACT inhaler TAKE 2 PUFFS BY MOUTH TWICE A DAY 1 each 5    buPROPion (WELLBUTRIN XL) 150 MG 24 hr tablet Take 2 tablets (300 mg total) by mouth daily. 90 tablet 1    diltiazem (CARDIZEM SR) 120 MG 12 hr capsule Take 1 capsule (120 mg total) by mouth every 12 (twelve) hours. 180 capsule 3    FLUoxetine (PROZAC) 20 MG capsule Take 2 capsules (40 mg total) by mouth daily. 60 capsule 0    furosemide (LASIX) 20 MG tablet Take 1 tablet (20 mg total) by mouth daily. 90 tablet 3    isosorbide mononitrate (IMDUR) 60 MG 24 hr tablet Take 60 mg by mouth daily.      lisinopril (ZESTRIL) 40 MG tablet Take 1 tablet (40 mg total) by mouth daily. 90 tablet 3    metoprolol tartrate (LOPRESSOR) 25 MG tablet Take 1 tablet (25 mg total) by mouth 2 (two) times daily. 180 tablet 3    pantoprazole (PROTONIX) 40 MG tablet Take 1 tablet (40 mg total) by mouth daily. 90 tablet 3  potassium chloride SA (KLOR-CON M) 20 MEQ tablet Take 1 tablet (20 mEq total) by mouth 2 (two) times daily. 180 tablet 3    Social History   Socioeconomic History   Marital status: Single    Spouse name: Not on file   Number of children: Not on file   Years of education: Not on file   Highest education level: Not on file  Occupational History   Not on file  Tobacco Use   Smoking status: Every Day    Packs/day: 1.00    Years: 60.00    Total pack years: 60.00    Types: Cigarettes   Smokeless tobacco: Never  Substance and Sexual Activity   Alcohol use: No    Alcohol/week: 60.0 standard drinks of alcohol    Types: 60 Standard drinks or equivalent per week    Comment: daily 12 pk of beer    Drug use: No   Sexual activity: Not Currently  Other Topics Concern   Not on file  Social History Narrative   [place] Veteran. Lives alone   Social Determinants of Health   Financial Resource Strain: Low Risk  (09/09/2021)   Overall Financial Resource Strain (CARDIA)    Difficulty of Paying Living Expenses: Not very hard  Food Insecurity: No Food Insecurity (09/09/2021)   Hunger Vital Sign    Worried About Running Out of Food in the Last Year: Never true    Ran Out of Food in the Last Year: Never true  Transportation Needs: No Transportation Needs (09/09/2021)   PRAPARE - Hydrologist (Medical): No    Lack of Transportation (Non-Medical): No  Physical Activity: Inactive (09/09/2021)   Exercise Vital Sign    Days of Exercise per Week: 0 days    Minutes of Exercise per Session: 0 min  Stress: No Stress Concern Present (09/09/2021)   [place]    Feeling of Stress : Only a little  Social Connections: Socially Isolated (09/09/2021)   Social Connection and Isolation Panel [NHANES]    Frequency of Communication with Friends and Family: More than three times a week    Frequency of Social Gatherings with Friends and Family: More than three times a week    Attends Religious Services: Never    Marine scientist or Organizations: No    Attends Archivist Meetings: Never    Marital Status: Never married  Intimate Partner Violence: Not At Risk (09/09/2021)   Humiliation, Afraid, Rape, and Kick questionnaire    Fear of Current or Ex-Partner: No    Emotionally Abused: No    Physically Abused: No    Sexually Abused: No    Family History  Problem Relation Age of Onset   Cirrhosis Father    Hypertension Mother    Lung cancer Sister    Hypertension Sister    Arthritis Sister    Heart murmur Sister       Review of systems complete and found to be negative unless listed above       PHYSICAL EXAM  General: Well developed, well nourished, in no acute distress HEENT:  Normocephalic and atramatic Neck:  No JVD.  Lungs: Clear bilaterally to auscultation and percussion. Heart: Irregularly irregular. Normal S1 and S2 without gallops or murmurs.  Abdomen: Bowel sounds are positive, abdomen soft and non-tender  Msk:  Back normal, normal gait. Normal strength and tone for age. Extremities: No clubbing, cyanosis or edema.  Neuro: Alert and oriented X 3. Psych:  Good affect, responds appropriately  Labs:   Lab Results  Component Value Date   WBC 8.1 09/21/2021   HGB 13.4 09/21/2021   HCT 39.8 09/21/2021   MCV 102.1 (H) 09/21/2021   PLT 171 09/21/2021    Recent Labs  Lab 09/21/21 1745  NA 139  K 4.3  CL 113*  CO2 22  BUN 22  CREATININE 0.85  CALCIUM 8.3*  GLUCOSE 138*   Lab Results  Component Value Date   TROPONINI <0.03 02/17/2017    Lab Results  Component Value Date   CHOL 235 (H) 12/19/2019   CHOL 177 05/24/2015   Lab Results  Component Value Date   HDL 46 12/19/2019   HDL 33 (L) 05/24/2015   Lab Results  Component Value Date   LDLCALC 160 (H) 12/19/2019   LDLCALC 127 (H) 05/24/2015   Lab Results  Component Value Date   TRIG 155 (H) 12/19/2019   TRIG 84 05/24/2015   Lab Results  Component Value Date   CHOLHDL 5.1 (H) 12/19/2019   CHOLHDL 5.4 05/24/2015   No results found for: "LDLDIRECT"    Radiology: ECHOCARDIOGRAM COMPLETE  Result Date: 09/21/2021    ECHOCARDIOGRAM REPORT   Patient Name:   JAYCEION LISENBY Date of Exam: 09/21/2021 Medical Rec #:  [id number]        Height:       73.0 in Accession #:    [id number]       Weight:       238.1 lb Date of Birth:  [birth year]        BSA:          2.317 m Patient Age:    76 years         BP:           144/61 mmHg Patient Gender: M                HR:           52 bpm. Exam Location:  [facility] Procedure: 2D Echo, Color Doppler and Cardiac Doppler Indications:     I48.92 Atrial Flutter   History:         Patient has prior history of Echocardiogram examinations, most                  recent 02/17/2017. Previous Myocardial Infarction, Stroke; Risk                  Factors:Hypertension and Current Smoker.  Sonographer:     Charmayne Sheer Referring Phys:  [id number] [place] TANG Diagnosing Phys: Yolonda Kida MD  Sonographer Comments: Technically difficult study due to poor echo windows and suboptimal parasternal window. IMPRESSIONS  1. Left ventricular ejection fraction, by estimation, is 55 to 60%. The left ventricle has normal function. The left ventricle has no regional wall motion abnormalities. The left ventricular internal cavity size was moderately dilated. There is mild left ventricular hypertrophy. Left ventricular diastolic parameters were normal.  2. Right ventricular systolic function is normal. The right ventricular size is normal.  3. Left atrial size was mild to moderately dilated.  4. The mitral valve is normal in structure. Trivial mitral valve regurgitation.  5. The aortic valve is grossly normal. Aortic valve regurgitation is not visualized. FINDINGS  Left Ventricle: Left ventricular ejection fraction, by estimation, is 55 to 60%. The left ventricle has normal function. The left ventricle has no regional wall motion abnormalities.  The left ventricular internal cavity size was moderately dilated. There is mild left ventricular hypertrophy. Left ventricular diastolic parameters were normal. Right Ventricle: The right ventricular size is normal. No increase in right ventricular wall thickness. Right ventricular systolic function is normal. Left Atrium: Left atrial size was mild to moderately dilated. Right Atrium: Right atrial size was normal in size. Pericardium: There is no evidence of pericardial effusion. Mitral Valve: The mitral valve is normal in structure. Trivial mitral valve regurgitation. Tricuspid Valve: The tricuspid valve is normal in structure. Tricuspid valve  regurgitation is not demonstrated. Aortic Valve: The aortic valve is grossly normal. Aortic valve regurgitation is not visualized. Aortic valve mean gradient measures 6.0 mmHg. Aortic valve peak gradient measures 12.4 mmHg. Aortic valve area, by VTI measures 3.58 cm. Pulmonic Valve: The pulmonic valve was normal in structure. Pulmonic valve regurgitation is not visualized. Aorta: The ascending aorta was not well visualized. IAS/Shunts: No atrial level shunt detected by color flow Doppler.  LEFT VENTRICLE PLAX 2D LVIDd:         5.63 cm      Diastology LVIDs:         3.80 cm      LV e' medial:    8.92 cm/s LV PW:         1.40 cm      LV E/e' medial:  9.9 LV IVS:        0.95 cm      LV e' lateral:   12.90 cm/s LVOT diam:     2.40 cm      LV E/e' lateral: 6.8 LV SV:         112 LV SV Index:   48 LVOT Area:     4.52 cm  LV Volumes (MOD) LV vol d, MOD A4C: 151.0 ml LV vol s, MOD A4C: 78.7 ml LV SV MOD A4C:     151.0 ml RIGHT VENTRICLE RV Basal diam:  3.83 cm LEFT ATRIUM             Index        RIGHT ATRIUM           Index LA diam:        4.90 cm 2.11 cm/m   RA Area:     15.10 cm LA Vol (A2C):   52.2 ml 22.53 ml/m  RA Volume:   35.00 ml  15.10 ml/m LA Vol (A4C):   60.2 ml 25.98 ml/m LA Biplane Vol: 56.5 ml 24.38 ml/m  AORTIC VALVE                     PULMONIC VALVE AV Area (Vmax):    3.47 cm      PV Vmax:       1.00 m/s AV Area (Vmean):   3.52 cm      PV Peak grad:  4.0 mmHg AV Area (VTI):     3.58 cm AV Vmax:           176.00 cm/s AV Vmean:          110.000 cm/s AV VTI:            0.313 m AV Peak Grad:      12.4 mmHg AV Mean Grad:      6.0 mmHg LVOT Vmax:         135.00 cm/s LVOT Vmean:        85.500 cm/s LVOT VTI:  0.248 m LVOT/AV VTI ratio: 0.79  AORTA Ao Root diam: 3.10 cm MITRAL VALVE MV Area (PHT): 3.36 cm    SHUNTS MV Decel Time: 226 msec    Systemic VTI:  0.25 m MV E velocity: 88.30 cm/s  Systemic Diam: 2.40 cm MV A velocity: 65.60 cm/s MV E/A ratio:  1.35 Yolonda Kida MD Electronically  signed by Yolonda Kida MD Signature Date/Time: 09/21/2021/6:34:13 PM    Final    PERIPHERAL VASCULAR CATHETERIZATION  Result Date: 09/21/2021 See surgical note for result.  US Venous Img Lower Unilateral Left  Result Date: 09/21/2021 CLINICAL DATA:  Left lower extremity pain. EXAM: Left LOWER EXTREMITY VENOUS DOPPLER ULTRASOUND TECHNIQUE: Gray-scale sonography with compression, as well as color and duplex ultrasound, were performed to evaluate the deep venous system(s) from the level of the common femoral vein through the popliteal and proximal calf veins. COMPARISON:  None Available. FINDINGS: VENOUS Normal compressibility of the common femoral, superficial femoral, and popliteal veins, as well as the visualized calf veins. Visualized portions of profunda femoral vein and great saphenous vein unremarkable. No filling defects to suggest DVT on grayscale or color Doppler imaging. Doppler waveforms show normal direction of venous flow, normal respiratory plasticity and response to augmentation. Limited views of the contralateral common femoral vein are unremarkable. OTHER None. Limitations: none IMPRESSION: Negative. Electronically Signed   By: Anner Crete M.D.   On: 09/21/2021 01:30   CT Head Wo Contrast  Result Date: 09/06/2021 CLINICAL DATA:  Right arm weakness difficulty walking EXAM: CT HEAD WITHOUT CONTRAST TECHNIQUE: Contiguous axial images were obtained from the base of the skull through the vertex without intravenous contrast. RADIATION DOSE REDUCTION: This exam was performed according to the departmental dose-optimization program which includes automated exposure control, adjustment of the mA and/or kV according to patient size and/or use of iterative reconstruction technique. COMPARISON:  CT brain 08/02/2020 FINDINGS: Brain: No acute territorial infarction, hemorrhage or intracranial mass. Encephalomalacia in the right temporal lobe and right greater than left inferior frontal lobes.  Atrophy and chronic small vessel ischemic changes of the white matter. Stable ventricle size. Vascular: No hyperdense vessels.  Carotid vascular calcification. Skull: Normal. Negative for fracture or focal lesion. Sinuses/Orbits: No acute finding. Other: None IMPRESSION: 1. No CT evidence for acute intracranial abnormality. 2. Atrophy and chronic small vessel ischemic changes of the white matter. Encephalomalacia within the right temporal lobe and bilateral frontal lobes. Electronically Signed   By: Donavan Foil M.D.   On: 09/06/2021 15:30   DG Chest 2 View  Result Date: 09/06/2021 CLINICAL DATA:  Dyspnea EXAM: CHEST - 2 VIEW COMPARISON:  08/02/2020 chest radiograph. FINDINGS: Stable cardiomediastinal silhouette with normal heart size. No pneumothorax. No pleural effusion. Lungs appear clear, with no acute consolidative airspace disease and no pulmonary edema. IMPRESSION: No active cardiopulmonary disease. Electronically Signed   By: Ilona Sorrel M.D.   On: 09/06/2021 13:52    EKG: Atrial fibrillation rate of about 80 low voltage possible septal Q waves nonspecific ST-T wave changes  ASSESSMENT AND PLAN:  Peripheral vascular disease Acute limb ischemia Leg pain Atrial fibrillation atrial flutter Embolic event Coronary artery disease Obesity Hyperlipidemia GERD COPD . Plan Agree with interventional therapy by vascular including thrombectomy Recommend anticoagulation transition from heparin to Eliquis or Xarelto if patient cannot afford it and will switch to Coumadin Recommend rate control with Cardizem beta-blocker Weight loss exercise portion control Sleep study CPAP if indicated weight loss Continue Lopressor and Imdur for coronary disease possible  angina Continue hypertension management and control metoprolol possibly Cardizem Recommend transition from heparin to Coumadin therapy Continue Protonix therapy for reflux type symptoms   Signed: Yolonda Kida MD 09/21/2021, 6:37  PM

## 2021-09-21 NOTE — Assessment & Plan Note (Signed)
-   We will obtain an EKG. - Eliquis was switched to IV heparin. - We will continue beta-blocker therapy and calcium channel blocker therapy with Cardizem CD

## 2021-09-21 NOTE — Assessment & Plan Note (Addendum)
Continue Wellbutrin XL and Prozac.

## 2021-09-21 NOTE — Progress Notes (Signed)
[place] for IV heparin Indication: arterial embolism of the left leg  No Known Allergies  Patient Measurements: Height: 6\' 1"  (185.4 cm) Weight: 108 kg (238 lb 1.6 oz) IBW/kg (Calculated) : 79.9 Heparin Dosing Weight: 102.3 kg  Vital Signs: Temp: 98.1 F (36.7 C) (08/02 1723) Temp Source: Oral (08/02 1723) BP: 136/57 (08/02 1723) Pulse Rate: 59 (08/02 1723)  Labs: Recent Labs    09/21/21 0254 09/21/21 1745  HGB 15.5 13.4  HCT 46.0 39.8  PLT 199 171  APTT 29  --   LABPROT 14.0  --   INR 1.1  --   HEPARINUNFRC <0.10* 0.58  CREATININE 1.21 0.85     Estimated Creatinine Clearance: 99.7 mL/min (by C-G formula based on SCr of 0.85 mg/dL).   Medical History: Past Medical History:  Diagnosis Date   Aortic atherosclerosis ([place]) 02/16/2017   Arthritis    Blind left eye    Hypertension    Low TSH level 02/17/2017   Myocardial infarction ([place])    Perforated duodenal ulcer ([place])    Stroke ([facility])     Medications:  Medications Prior to Admission  Medication Sig Dispense Refill Last Dose   albuterol (PROVENTIL HFA;VENTOLIN HFA) 108 (90 Base) MCG/ACT inhaler Inhale 2 puffs into the lungs every 6 (six) hours as needed for wheezing or shortness of breath. 1 Inhaler 1    amLODipine (NORVASC) 10 MG tablet Take 1 tablet (10 mg total) by mouth daily at 2 PM. 90 tablet 3    apixaban (ELIQUIS) 5 MG TABS tablet Take 1 tablet (5 mg total) by mouth 2 (two) times daily. 60 tablet 6    atorvastatin (LIPITOR) 40 MG tablet Take 0.5 tablets (20 mg total) by mouth daily. 90 tablet 3    budesonide-formoterol (SYMBICORT) 160-4.5 MCG/ACT inhaler TAKE 2 PUFFS BY MOUTH TWICE A DAY 1 each 5    buPROPion (WELLBUTRIN XL) 150 MG 24 hr tablet Take 2 tablets (300 mg total) by mouth daily. 90 tablet 1    diltiazem (CARDIZEM SR) 120 MG 12 hr capsule Take 1 capsule (120 mg total) by mouth every 12 (twelve) hours. 180 capsule 3    FLUoxetine (PROZAC) 20 MG capsule  Take 2 capsules (40 mg total) by mouth daily. 60 capsule 0    furosemide (LASIX) 20 MG tablet Take 1 tablet (20 mg total) by mouth daily. 90 tablet 3    isosorbide mononitrate (IMDUR) 60 MG 24 hr tablet Take 60 mg by mouth daily.      lisinopril (ZESTRIL) 40 MG tablet Take 1 tablet (40 mg total) by mouth daily. 90 tablet 3    metoprolol tartrate (LOPRESSOR) 25 MG tablet Take 1 tablet (25 mg total) by mouth 2 (two) times daily. 180 tablet 3    pantoprazole (PROTONIX) 40 MG tablet Take 1 tablet (40 mg total) by mouth daily. 90 tablet 3    potassium chloride SA (KLOR-CON M) 20 MEQ tablet Take 1 tablet (20 mEq total) by mouth 2 (two) times daily. 180 tablet 3    Scheduled:   amLODipine  10 mg Oral Q1400   atorvastatin  20 mg Oral Daily   buPROPion  300 mg Oral Daily   cilostazol  100 mg Oral BID   diltiazem  120 mg Oral Q12H   fentaNYL       FLUoxetine  40 mg Oral Daily   heparin sodium (porcine)       isosorbide mononitrate  60 mg Oral Daily  lisinopril  40 mg Oral Daily   metoprolol tartrate  25 mg Oral BID   midazolam       midazolam       mometasone-formoterol  2 puff Inhalation BID   pantoprazole  40 mg Oral Daily   potassium chloride SA  20 mEq Oral BID   sodium chloride flush  3 mL Intravenous Q12H   Infusions:   sodium chloride 100 mL/hr at 09/21/21 1912   sodium chloride     heparin 1,800 Units/hr (09/21/21 1912)    PTA Anticoagulation: PTA med list says pt is on Eliquis 5 mg PO BID,  however pt states he never had rx filled b/c it was too expensive.   Assessment: 71 year old male admitted with left lower leg arterial embolism. Now s/p lower extremity angiography. Pharmacy consulted to re-start heparin.  CrCl = 70.1 ml/min  Baseline HL < 0.10. Baseline CBC stable.   8/2 1745 HL= 0.58 therapeutic  Goal of Therapy:  Heparin level 0.3-0.7 units/ml Monitor platelets by anticoagulation protocol: Yes   Plan:  8/2 1745 HL= 0.58 therapeutic Continue heparin at 1800  units/hr.  Follow nomogram  Confirmatory 8-hour heparin level.  Continue to monitor H&H and platelets. CBC daily  Chinita Greenland PharmD Clinical Pharmacist 09/21/2021

## 2021-09-21 NOTE — Assessment & Plan Note (Signed)
-   We will continue his antihypertensives. 

## 2021-09-21 NOTE — Assessment & Plan Note (Signed)
-   We will continue PPI therapy 

## 2021-09-21 NOTE — H&P (View-Only) (Signed)
@LOGO @   MRN : [id number]  Gregory Lane is a 71 y.o. ([birth year]) male who presents with chief complaint of check circulation.  History of Present Illness:  I am asked to evaluate the patient by Dr. Tamala Julian.  Patient is a 71 year old gentleman presented the emergency room last night with a complaint of cute onset of pain in his left lower extremity.  Although he has known atherosclerotic occlusive disease he was not following with anyone.  He has a long history of claudication but this was an abrupt change.  In the emergency room evaluation showed significant worsening of his atherosclerotic changes.  He was admitted started on a heparin drip.  No outpatient medications have been marked as taking for the 09/21/21 encounter [facility] Encounter).    Past Medical History:  Diagnosis Date   Aortic atherosclerosis ([place]) 02/16/2017   Arthritis    Blind left eye    Hypertension    Low TSH level 02/17/2017   Myocardial infarction [facility])    Perforated duodenal ulcer ([place])    Stroke [facility])     Past Surgical History:  Procedure Laterality Date   EXPLORATORY LAPAROTOMY  05/12/2015   EYE SURGERY     FRACTURE SURGERY     LAPAROTOMY N/A 05/10/2015   Procedure: EXPLORATORY LAPAROTOMY;  Surgeon: Ralene Ok, MD;  Location: [place];  Service: General;  Laterality: N/A;   LEFT HEART CATH AND CORONARY ANGIOGRAPHY N/A 05/02/2016   Procedure: Left Heart Cath and Coronary Angiography;  Surgeon: Adrian Prows, MD;  Location: [place] CV [facility];  Service: Cardiovascular;  Laterality: N/A;    Social History Social History   Tobacco Use   Smoking status: Every Day    Packs/day: 1.00    Years: 60.00    Total pack years: 60.00    Types: Cigarettes   Smokeless tobacco: Never  Substance Use Topics   Alcohol use: No    Alcohol/week: 60.0 standard drinks of alcohol    Types: 60 Standard drinks or equivalent per week    Comment: daily 12 pk of beer   Drug use: No    Family  History Family History  Problem Relation Age of Onset   Cirrhosis Father    Hypertension Mother    Lung cancer Sister    Hypertension Sister    Arthritis Sister    Heart murmur Sister     No Known Allergies   REVIEW OF SYSTEMS (Negative unless checked)  Constitutional: [] Weight loss  [] Fever  [] Chills Cardiac: [] Chest pain   [] Chest pressure   [] Palpitations   [] Shortness of breath when laying flat   [] Shortness of breath with exertion. Vascular:  [x] Pain in legs with walking   [x] Pain in legs at rest  [] History of DVT   [] Phlebitis   [] Swelling in legs   [] Varicose veins   [] Non-healing ulcers Pulmonary:   [] Uses home oxygen   [] Productive cough   [] Hemoptysis   [] Wheeze  [] COPD   [] Asthma Neurologic:  [] Dizziness   [] Seizures   [] History of stroke   [] History of TIA  [] Aphasia   [] Vissual changes   [] Weakness or numbness in arm   [] Weakness or numbness in leg Musculoskeletal:   [] Joint swelling   [] Joint pain   [] Low back pain Hematologic:  [] Easy bruising  [] Easy bleeding   [] Hypercoagulable state   [] Anemic Gastrointestinal:  [] Diarrhea   [] Vomiting  [] Gastroesophageal reflux/heartburn   [] Difficulty swallowing. Genitourinary:  [] Chronic kidney  disease   [] Difficult urination  [] Frequent urination   [] Blood in urine Skin:  [] Rashes   [] Ulcers  Psychological:  [] History of anxiety   []  History of major depression.  Physical Examination  Vitals:   09/21/21 0600 09/21/21 0630 09/21/21 0700 09/21/21 0747  BP: (!) 177/103 (!) 171/80 (!) 153/75 (!) 181/73  Pulse: (!) 57 (!) 44 (!) 52   Resp:    16  Temp:      TempSrc:      SpO2: 93% 98% 93% 96%  Weight:      Height:       Body mass index is 31.41 kg/m. Gen: WD/WN, NAD Head: [place]/AT, No temporalis wasting.  Ear/Nose/Throat: Hearing grossly intact, nares w/o erythema or drainage Eyes: PER, EOMI, sclera nonicteric.  Neck: Supple, no masses.  No bruit or JVD.  Pulmonary:  Good air movement, no audible wheezing, no use of  accessory muscles.  Cardiac: RRR, normal S1, S2, no Murmurs. Vascular:  mild trophic changes, no open wounds foot is cool to the touch with very sluggish capillary refill on the left Vessel Right Left  Radial Palpable Palpable  PT Not Palpable Not Palpable  DP Not Palpable Not Palpable  Gastrointestinal: soft, non-distended. No guarding/no peritoneal signs.  Musculoskeletal: M/S 5/5 throughout.  No visible deformity.  Neurologic: CN 2-12 intact. Pain and light touch intact in extremities.  Symmetrical.  Speech is fluent. Motor exam as listed above. Psychiatric: Judgment intact, Mood & affect appropriate for pt's clinical situation. Dermatologic: No rashes or ulcers noted.  No changes consistent with cellulitis.   CBC Lab Results  Component Value Date   WBC 10.6 (H) 09/21/2021   HGB 15.5 09/21/2021   HCT 46.0 09/21/2021   MCV 102.9 (H) 09/21/2021   PLT 199 09/21/2021    BMET    Component Value Date/Time   NA 138 09/21/2021 0254   K 4.7 09/21/2021 0254   CL 110 09/21/2021 0254   CO2 20 (L) 09/21/2021 0254   GLUCOSE 117 (H) 09/21/2021 0254   BUN 30 (H) 09/21/2021 0254   CREATININE 1.21 09/21/2021 0254   CREATININE 0.91 12/19/2019 1114   CALCIUM 8.8 (L) 09/21/2021 0254   GFRNONAA >60 09/21/2021 0254   GFRNONAA 84 12/19/2019 1114   GFRAA 98 12/19/2019 1114   Estimated Creatinine Clearance: 70.1 mL/min (by C-G formula based on SCr of 1.21 mg/dL).  COAG Lab Results  Component Value Date   INR 1.1 09/21/2021   INR 1.00 02/16/2017   INR 1.12 05/10/2015    Radiology US Venous Img Lower Unilateral Left  Result Date: 09/21/2021 CLINICAL DATA:  Left lower extremity pain. EXAM: Left LOWER EXTREMITY VENOUS DOPPLER ULTRASOUND TECHNIQUE: Gray-scale sonography with compression, as well as color and duplex ultrasound, were performed to evaluate the deep venous system(s) from the level of the common femoral vein through the popliteal and proximal calf veins. COMPARISON:  None  Available. FINDINGS: VENOUS Normal compressibility of the common femoral, superficial femoral, and popliteal veins, as well as the visualized calf veins. Visualized portions of profunda femoral vein and great saphenous vein unremarkable. No filling defects to suggest DVT on grayscale or color Doppler imaging. Doppler waveforms show normal direction of venous flow, normal respiratory plasticity and response to augmentation. Limited views of the contralateral common femoral vein are unremarkable. OTHER None. Limitations: none IMPRESSION: Negative. Electronically Signed   By: Anner Crete M.D.   On: 09/21/2021 01:30   CT Head Wo Contrast  Result Date: 09/06/2021 CLINICAL DATA:  Right arm weakness difficulty walking EXAM: CT HEAD WITHOUT CONTRAST TECHNIQUE: Contiguous axial images were obtained from the base of the skull through the vertex without intravenous contrast. RADIATION DOSE REDUCTION: This exam was performed according to the departmental dose-optimization program which includes automated exposure control, adjustment of the mA and/or kV according to patient size and/or use of iterative reconstruction technique. COMPARISON:  CT brain 08/02/2020 FINDINGS: Brain: No acute territorial infarction, hemorrhage or intracranial mass. Encephalomalacia in the right temporal lobe and right greater than left inferior frontal lobes. Atrophy and chronic small vessel ischemic changes of the white matter. Stable ventricle size. Vascular: No hyperdense vessels.  Carotid vascular calcification. Skull: Normal. Negative for fracture or focal lesion. Sinuses/Orbits: No acute finding. Other: None IMPRESSION: 1. No CT evidence for acute intracranial abnormality. 2. Atrophy and chronic small vessel ischemic changes of the white matter. Encephalomalacia within the right temporal lobe and bilateral frontal lobes. Electronically Signed   By: Donavan Foil M.D.   On: 09/06/2021 15:30   DG Chest 2 View  Result Date:  09/06/2021 CLINICAL DATA:  Dyspnea EXAM: CHEST - 2 VIEW COMPARISON:  08/02/2020 chest radiograph. FINDINGS: Stable cardiomediastinal silhouette with normal heart size. No pneumothorax. No pleural effusion. Lungs appear clear, with no acute consolidative airspace disease and no pulmonary edema. IMPRESSION: No active cardiopulmonary disease. Electronically Signed   By: Ilona Sorrel M.D.   On: 09/06/2021 13:52     Assessment/Plan Atherosclerotic occlusive disease bilateral lower extremities with rest pain of the left lower extremity: Recommend:  The patient has evidence of severe atherosclerotic changes of both lower extremities with rest pain that is associated with preulcerative changes and impending tissue loss of the left foot.  This represents a limb threatening ischemia and places the patient at the risk for left limb loss.  Patient should undergo angiography of the left lower extremity with the hope for intervention for limb salvage.  The risks and benefits as well as the alternative therapies was discussed in detail with the patient.  All questions were answered.  Patient agrees to proceed with left lower extremity angiography.  The patient will follow up with me in the office after the procedure.  Coronary artery disease: Continue cardiac and antihypertensive medications as already ordered and reviewed, no changes at this time.  Continue statin as ordered and reviewed, no changes at this time  Nitrates PRN for chest pain   Hypertension: Continue antihypertensive medications as already ordered, these medications have been reviewed and there are no changes at this time.         [name], MD  09/21/2021 7:53 AM

## 2021-09-21 NOTE — H&P (Signed)
PATIENT NAME: Gregory Lane    MR#:  [id number]  DATE OF BIRTH:  1950/09/02  DATE OF ADMISSION:  09/21/2021  PRIMARY CARE PHYSICIAN: Cletis Athens, MD   Patient is coming from: Home  REQUESTING/REFERRING PHYSICIAN: Vladimir Crofts, MD  CHIEF COMPLAINT:   Chief Complaint  Patient presents with   Leg Pain    HISTORY OF PRESENT ILLNESS:  Gregory Lane is a 71 y.o. male with medical history significant for chronic kidney disease, difficulty with disease, ongoing tobacco abuse, CVA, atrial flutter on Eliquis, dyslipidemia COPD, osteoarthritis, t who presented to the ER with acute onset of left leg pain that started about 4 hours before he arrived to the ER.  He took ibuprofen prior to his arrival without improvement.  He denies any falls or injuries.  No skin discolorations or contusions.  No fever or chills.  No nausea or vomiting or abdominal pain.  No chest pain or dyspnea or palpitations or cough or wheezing or hemoptysis.  No dysuria, oliguria or hematuria or flank pain.  No bleeding diathesis.  His left leg was noted to be cold.  He denies any recent claudications.  He continues to smoke half a pack of cigarettes per day and has been smoking for long time.  ED Course: When he came to the ER, BP was 165/95 with otherwise normal vital signs.  Labs revealed a CO2 of 20 with a BUN of 30 with creatinine of 1.21.  CBC showed mild leukocytosis of 10.7 with macrocytosis and neutrophilia.  Blood group was A+ with negative antibody screen.    Imaging: Lower extremity venous Doppler came back negative for DVT.  His right lower extremity ABI was 0.4 and he was noted to have diminished capillary refill.  Oxygen use contacted about the patient and will plan angiography later today.  The patient will be admitted to a medical telemetry bed for further evaluation and management. PAST MEDICAL HISTORY:   Past Medical History:  Diagnosis Date   Aortic atherosclerosis ([place])  02/16/2017   Arthritis    Blind left eye    Hypertension    Low TSH level 02/17/2017   Myocardial infarction ([place])    Perforated duodenal ulcer ([place])    Stroke [facility])     PAST SURGICAL HISTORY:   Past Surgical History:  Procedure Laterality Date   EXPLORATORY LAPAROTOMY  05/12/2015   EYE SURGERY     FRACTURE SURGERY     LAPAROTOMY N/A 05/10/2015   Procedure: EXPLORATORY LAPAROTOMY;  Surgeon: Ralene Ok, MD;  Location: [place];  Service: General;  Laterality: N/A;   LEFT HEART CATH AND CORONARY ANGIOGRAPHY N/A 05/02/2016   Procedure: Left Heart Cath and Coronary Angiography;  Surgeon: Adrian Prows, MD;  Location: [place] [facility];  Service: Cardiovascular;  Laterality: N/A;    SOCIAL HISTORY:   Social History   Tobacco Use   Smoking status: Every Day    Packs/day: 1.00    Years: 60.00    Total pack years: 60.00    Types: Cigarettes   Smokeless tobacco: Never  Substance Use Topics   Alcohol use: No    Alcohol/week: 60.0 standard drinks of alcohol    Types: 60 Standard drinks or equivalent per week    Comment: daily 12 pk of beer    FAMILY HISTORY:   Family History  Problem Relation Age of Onset   Cirrhosis Father    Hypertension Mother    Lung cancer Sister  Hypertension Sister    Arthritis Sister    Heart murmur Sister     DRUG ALLERGIES:  No Known Allergies  REVIEW OF SYSTEMS:   ROS As per history of present illness. All pertinent systems were reviewed above. Constitutional, HEENT, cardiovascular, respiratory, GI, GU, musculoskeletal, neuro, psychiatric, endocrine, integumentary and hematologic systems were reviewed and are otherwise negative/unremarkable except for positive findings mentioned above in the HPI.   MEDICATIONS AT HOME:   Prior to Admission medications   Medication Sig Start Date End Date Taking? Authorizing Provider  albuterol (PROVENTIL HFA;VENTOLIN HFA) 108 (90 Base) MCG/ACT inhaler Inhale 2 puffs into the lungs every 6 (six) hours  as needed for wheezing or shortness of breath. 05/18/15   Earnstine Regal, PA-C  amLODipine (NORVASC) 10 MG tablet Take 1 tablet (10 mg total) by mouth daily at 2 PM. 09/06/21   Bradler, Vista Lawman, MD  apixaban (ELIQUIS) 5 MG TABS tablet Take 1 tablet (5 mg total) by mouth 2 (two) times daily. 09/06/21   [name], MD  atorvastatin (LIPITOR) 40 MG tablet Take 0.5 tablets (20 mg total) by mouth daily. 09/06/21   [name], MD  budesonide-formoterol (SYMBICORT) 160-4.5 MCG/ACT inhaler TAKE 2 PUFFS BY MOUTH TWICE A DAY 09/06/21   [name], MD  buPROPion (WELLBUTRIN XL) 150 MG 24 hr tablet Take 2 tablets (300 mg total) by mouth daily. 09/06/21   [name], MD  diltiazem (CARDIZEM SR) 120 MG 12 hr capsule Take 1 capsule (120 mg total) by mouth every 12 (twelve) hours. 09/06/21   [name], MD  FLUoxetine (PROZAC) 20 MG capsule Take 2 capsules (40 mg total) by mouth daily. 09/06/21   [name], MD  furosemide (LASIX) 20 MG tablet Take 1 tablet (20 mg total) by mouth daily. 09/06/21   [name], MD  isosorbide mononitrate (IMDUR) 60 MG 24 hr tablet Take 60 mg by mouth daily.    [provider]  lisinopril (ZESTRIL) 40 MG tablet Take 1 tablet (40 mg total) by mouth daily. 09/06/21   [name], MD  metoprolol tartrate (LOPRESSOR) 25 MG tablet Take 1 tablet (25 mg total) by mouth 2 (two) times daily. 09/06/21   [name], MD  pantoprazole (PROTONIX) 40 MG tablet Take 1 tablet (40 mg total) by mouth daily. 09/06/21   [name], MD  potassium chloride SA (KLOR-CON M) 20 MEQ tablet Take 1 tablet (20 mEq total) by mouth 2 (two) times daily. 09/06/21   [name], MD      VITAL SIGNS:  Blood pressure (!) 165/95, pulse 92, temperature 98.2 F (36.8 C), temperature source Oral, resp. rate 18, height 6\' 1"  (1.854 m), weight 108 kg, SpO2 98 %.  PHYSICAL EXAMINATION:  Physical Exam  GENERAL:  71 y.o.-year-old male patient lying in the bed with no acute  distress.  EYES: Pupils equal, round, reactive to light and accommodation. No scleral icterus. Extraocular muscles intact.  HEENT: Head atraumatic, normocephalic. Oropharynx and nasopharynx clear.  NECK:  Supple, no jugular venous distention. No thyroid enlargement, no tenderness.  LUNGS: Normal breath sounds bilaterally, no wheezing, rales,rhonchi or crepitation. No use of accessory muscles of respiration.  CARDIOVASCULAR: Regular rate and rhythm, S1, S2 normal. No murmurs, rubs, or gallops.   I could not palpate a DP or PT pulse on the left leg and popliteal pulse was fairly weak. ABDOMEN: Soft, nondistended, nontender. Bowel sounds present. No organomegaly or mass.  EXTREMITIES:  No pedal edema, cyanosis, or clubbing.  NEUROLOGIC: Cranial nerves II through XII are intact. Muscle strength 5/5 in all extremities. Sensation intact. Gait not checked.  PSYCHIATRIC: The patient is alert and oriented x 3.  Normal affect and good eye contact. SKIN: No obvious rash, lesion, or ulcer.   LABORATORY PANEL:   CBC Recent Labs  Lab 09/21/21 0254  WBC 10.6*  HGB 15.5  HCT 46.0  PLT 199   ------------------------------------------------------------------------------------------------------------------  Chemistries  Recent Labs  Lab 09/21/21 0254  NA 138  K 4.7  CL 110  CO2 20*  GLUCOSE 117*  BUN 30*  CREATININE 1.21  CALCIUM 8.8*   ------------------------------------------------------------------------------------------------------------------  Cardiac Enzymes No results for input(s): "TROPONINI" in the last 168 hours. ------------------------------------------------------------------------------------------------------------------  RADIOLOGY:  US Venous Img Lower Unilateral Left  Result Date: 09/21/2021 CLINICAL DATA:  Left lower extremity pain. EXAM: Left LOWER EXTREMITY VENOUS DOPPLER ULTRASOUND TECHNIQUE: Gray-scale sonography with compression, as well as color and duplex  ultrasound, were performed to evaluate the deep venous system(s) from the level of the common femoral vein through the popliteal and proximal calf veins. COMPARISON:  None Available. FINDINGS: VENOUS Normal compressibility of the common femoral, superficial femoral, and popliteal veins, as well as the visualized calf veins. Visualized portions of profunda femoral vein and great saphenous vein unremarkable. No filling defects to suggest DVT on grayscale or color Doppler imaging. Doppler waveforms show normal direction of venous flow, normal respiratory plasticity and response to augmentation. Limited views of the contralateral common femoral vein are unremarkable. OTHER None. Limitations: none IMPRESSION: Negative. Electronically Signed   By: Anner Crete M.D.   On: 09/21/2021 01:30      IMPRESSION AND PLAN:  Assessment and Plan: * PAD (peripheral artery disease) ([place]) - This is associated with left lower extremity ischemia with ABI of 0.4. - The patient will be admitted to a medical telemetry bed. - Pain management to be provided. - He will be placed on Pletal. - We will switch apixaban to IV heparin. - Vascular surgery consult will be obtained. - Dr. Delana Meyer was notified and is aware about the patient.  Coronary artery disease - We will continue Lopressor and Imdur.  Chronic obstructive pulmonary disease (COPD) ([facility]) - We will continue his inhalers.  Atrial flutter ([place]) - We will obtain an EKG. - Eliquis was switched to IV heparin. - We will continue beta-blocker therapy and calcium channel blocker therapy with Cardizem CD  Depression Continue Wellbutrin XL and Prozac.  Dyslipidemia - We will continue statin therapy.  GERD without esophagitis We will continue PPI therapy.  Essential hypertension - We will continue his antihypertensives.    DVT prophylaxis: Lovenox.  Advanced Care Planning:  Code Status: full code.  Family Communication:  The plan of care was discussed  in details with the patient (and family). I answered all questions. The patient agreed to proceed with the above mentioned plan. Further management will depend upon hospital course. Disposition Plan: Back to previous home environment Consults called: none.  All the records are reviewed and case discussed with ED provider.  Status is: Inpatient  At the time of the admission, it appears that the appropriate admission status for this patient is inpatient.  This is judged to be reasonable and necessary in order to provide the required intensity of service to ensure the patient's safety given the presenting symptoms, physical exam findings and initial radiographic and laboratory data in the context of comorbid conditions.  The patient requires inpatient status due  to high intensity of service, high risk of further deterioration and high frequency of surveillance required.  I certify that at the time of admission, it is my clinical judgment that the patient will require inpatient hospital care extending more than 2 midnights.                            Dispo: The patient is from: Home              Anticipated d/c is to: Home              Patient currently is not medically stable to d/c.              Difficult to place patient: No  Christel Mormon M.D on 09/21/2021 at 6:01 AM  [facility]   From 7 PM-7 AM, contact night-coverage [email or web address]  CC: Primary care physician; Cletis Athens, MD

## 2021-09-21 NOTE — Progress Notes (Signed)
*  PRELIMINARY RESULTS* Echocardiogram 2D Echocardiogram has been performed.  Gregory Lane 09/21/2021, 4:04 PM

## 2021-09-21 NOTE — Assessment & Plan Note (Signed)
-   We will continue statin therapy. 

## 2021-09-22 ENCOUNTER — Other Ambulatory Visit (HOSPITAL_COMMUNITY): Payer: Self-pay

## 2021-09-22 ENCOUNTER — Encounter: Payer: Self-pay | Admitting: Vascular Surgery

## 2021-09-22 ENCOUNTER — Telehealth (HOSPITAL_COMMUNITY): Payer: Self-pay | Admitting: Pharmacy Technician

## 2021-09-22 DIAGNOSIS — I739 Peripheral vascular disease, unspecified: Secondary | ICD-10-CM | POA: Diagnosis not present

## 2021-09-22 LAB — CBC
HCT: 36.3 % — ABNORMAL LOW (ref 39.0–52.0)
Hemoglobin: 12.3 g/dL — ABNORMAL LOW (ref 13.0–17.0)
MCH: 34.8 pg — ABNORMAL HIGH (ref 26.0–34.0)
MCHC: 33.9 g/dL (ref 30.0–36.0)
MCV: 102.8 fL — ABNORMAL HIGH (ref 80.0–100.0)
Platelets: 162 10*3/uL (ref 150–400)
RBC: 3.53 MIL/uL — ABNORMAL LOW (ref 4.22–5.81)
RDW: 12.5 % (ref 11.5–15.5)
WBC: 8.4 10*3/uL (ref 4.0–10.5)
nRBC: 0 % (ref 0.0–0.2)

## 2021-09-22 LAB — HEPARIN LEVEL (UNFRACTIONATED): Heparin Unfractionated: 0.65 IU/mL (ref 0.30–0.70)

## 2021-09-22 MED ORDER — WARFARIN SODIUM 7.5 MG PO TABS
7.5000 mg | ORAL_TABLET | Freq: Once | ORAL | Status: AC
  Administered 2021-09-22: 7.5 mg via ORAL
  Filled 2021-09-22: qty 1

## 2021-09-22 MED ORDER — WARFARIN - PHARMACIST DOSING INPATIENT: Freq: Every day | Status: DC

## 2021-09-22 NOTE — Evaluation (Signed)
Physical Therapy Evaluation Patient Details Name: Gregory Lane MRN: [id number] DOB: May 13, 1950 Today's Date: 09/22/2021  History of Present Illness  Pt is a 71 y.o. male presenting to hospital 09/20/21 for evaluation of acute on chronic L leg pain.  Pt admitted with PAD with L LE ischemia with ABI of 0.4.  S/p mechanical thrombectomy 8/2 d/t atherosclerotic occlusive disease B LE's with rest pain and ischemic changes of the L foot.  PMH includes aflutter on eliquis, htn, HLD, CVA, COPD, CKD, blind L eye, MI.  Clinical Impression  Prior to hospital admission, pt was independent with functional mobility; h/o falls (pt reports either R or L leg would give out); lives with sister and sister in law in 1 level home with 4-5 STE B railings.  Pt sitting in recliner upon PT arrival and pt reporting no pain at rest; pt also reporting walking in hallway with nursing staff earlier today using RW.  Currently pt is SBA with transfers and CGA ambulating 120 feet with RW use.  Limited distance ambulating d/t pt reporting R knee feeling weak and then R calf starting to cramp (pt reports feeling same symptoms with walk earlier today).  Pt would benefit from skilled PT to address noted impairments and functional limitations (see below for any additional details).  Upon hospital discharge, pt would benefit from [place].    Recommendations for follow up therapy are one component of a multi-disciplinary discharge planning process, led by the attending physician.  Recommendations may be updated based on patient status, additional functional criteria and insurance authorization.  Follow Up Recommendations Home health PT      Assistance Recommended at Discharge Intermittent Supervision/Assistance  Patient can return home with the following  A little help with walking and/or transfers;A little help with bathing/dressing/bathroom;Assistance with cooking/housework;Assist for transportation;Help with stairs or ramp for  entrance    Equipment Recommendations Rolling walker (2 wheels);BSC/3in1  Recommendations for Other Services       Functional Status Assessment Patient has had a recent decline in their functional status and demonstrates the ability to make significant improvements in function in a reasonable and predictable amount of time.     Precautions / Restrictions Precautions Precautions: Fall Restrictions Weight Bearing Restrictions: No      Mobility  Bed Mobility               General bed mobility comments: Deferred (pt in recliner beginning/end of session)    Transfers Overall transfer level: Needs assistance Equipment used: None Transfers: Sit to/from Stand Sit to Stand: Supervision           General transfer comment: fairly strong stand up to RW x2 trials    Ambulation/Gait Ambulation/Gait assistance: Min guard Gait Distance (Feet): 120 Feet Assistive device: Rolling walker (2 wheels)   Gait velocity: decreased     General Gait Details: partial step through to step through gait pattern; initial vc's to stay closer to RW during ambulation  Stairs            Wheelchair Mobility    Modified Rankin (Stroke Patients Only)       Balance Overall balance assessment: Needs assistance Sitting-balance support: No upper extremity supported, Feet supported Sitting balance-Leahy Scale: Good Sitting balance - Comments: steady sitting reaching within BOS   Standing balance support: Bilateral upper extremity supported, During functional activity, Reliant on assistive device for balance Standing balance-Leahy Scale: Fair Standing balance comment: no loss of balance ambulating with RW  Pertinent Vitals/Pain Pain Assessment Pain Assessment: Faces Pain Score: 4  Pain Location: R calf cramping post ambulation Pain Descriptors / Indicators: Cramping Pain Intervention(s): Limited activity within patient's tolerance,  Monitored during session, Repositioned HR 49-60 bpm during sessions activities (pt reports his HR fluctuates and can be low like this); O2 sats WFL on room air.    Home Living Family/patient expects to be discharged to:: Private residence Living Arrangements: Other relatives (Pt's sister and sister in law) Available Help at Discharge: Family;Available PRN/intermittently Type of Home: House Home Access: Stairs to enter Entrance Stairs-Rails: Right;Left;Can reach both Entrance Stairs-Number of Steps: 4-5   Home Layout: One level Home Equipment: Grab bars - tub/shower;Rolling Walker (2 wheels);Cane - single point      Prior Function Prior Level of Function : Independent/Modified Independent             Mobility Comments: Pt reports h/o falls in past 6 months (R or L leg would give out).  Does not use any AD/DME for ambulation.       Hand Dominance        Extremity/Trunk Assessment   Upper Extremity Assessment Upper Extremity Assessment: Overall WFL for tasks assessed    Lower Extremity Assessment Lower Extremity Assessment: Generalized weakness    Cervical / Trunk Assessment Cervical / Trunk Assessment: Normal  Communication   Communication: No difficulties  Cognition Arousal/Alertness: Awake/alert Behavior During Therapy: WFL for tasks assessed/performed Overall Cognitive Status: Within Functional Limits for tasks assessed                                          General Comments General comments (skin integrity, edema, etc.): No bleeding noted R femoral site.  Nurse cleared pt for participation in physical therapy.  Pt agreeable to PT session.    Exercises  Gait training with RW   Assessment/Plan    PT Assessment Patient needs continued PT services  PT Problem List Decreased strength;Decreased activity tolerance;Decreased balance;Decreased mobility;Decreased knowledge of use of DME;Decreased knowledge of precautions;Pain       PT  Treatment Interventions DME instruction;Gait training;Stair training;Functional mobility training;Therapeutic activities;Therapeutic exercise;Balance training;Patient/family education    PT Goals (Current goals can be found in the Care Plan section)  Acute Rehab PT Goals Patient Stated Goal: to improve LE pain PT Goal Formulation: With patient Time For Goal Achievement: 10/06/21 Potential to Achieve Goals: Fair    Frequency Min 2X/week     Co-evaluation               AM-PAC PT "6 Clicks" Mobility  Outcome Measure Help needed turning from your back to your side while in a flat bed without using bedrails?: None Help needed moving from lying on your back to sitting on the side of a flat bed without using bedrails?: A Little Help needed moving to and from a bed to a chair (including a wheelchair)?: A Little Help needed standing up from a chair using your arms (e.g., wheelchair or bedside chair)?: A Little Help needed to walk in hospital room?: A Little Help needed climbing 3-5 steps with a railing? : A Little 6 Click Score: 19    End of Session Equipment Utilized During Treatment: Gait belt Activity Tolerance: Patient tolerated treatment well Patient left: in chair;with call bell/phone within reach (pt reports he was told not to get up without staff assist so he calls for help  and then waits until staff comes to help) Nurse Communication: Mobility status;Precautions PT Visit Diagnosis: Other abnormalities of gait and mobility (R26.89);Muscle weakness (generalized) (M62.81);History of falling (Z91.81);Pain Pain - Right/Left: Right Pain - part of body: Leg    Time: 5465-0354 PT Time Calculation (min) (ACUTE ONLY): 22 min   Charges:   PT Evaluation $PT Eval Low Complexity: 1 Low PT Treatments $Gait Training: 8-22 mins       Leitha Bleak, PT 09/22/21, 3:16 PM

## 2021-09-22 NOTE — Progress Notes (Signed)
[place] for IV heparin Indication: arterial embolism of the left leg  No Known Allergies  Patient Measurements: Height: 6\' 1"  (185.4 cm) Weight: 108 kg (238 lb 1.6 oz) IBW/kg (Calculated) : 79.9 Heparin Dosing Weight: 102.3 kg  Vital Signs: Temp: 97.6 F (36.4 C) (08/03 0809) Temp Source: Oral (08/03 0809) BP: 118/53 (08/03 0809) Pulse Rate: 50 (08/03 0809)  Labs: Recent Labs    09/21/21 0254 09/21/21 1745 09/22/21 0205  HGB 15.5 13.4 12.3*  HCT 46.0 39.8 36.3*  PLT 199 171 162  APTT 29  --   --   LABPROT 14.0  --   --   INR 1.1  --   --   HEPARINUNFRC <0.10* 0.58 0.65  CREATININE 1.21 0.85  --      Estimated Creatinine Clearance: 99.7 mL/min (by C-G formula based on SCr of 0.85 mg/dL).   Medical History: Past Medical History:  Diagnosis Date   Aortic atherosclerosis ([place]) 02/16/2017   Arthritis    Blind left eye    Hypertension    Low TSH level 02/17/2017   Myocardial infarction ([place])    Perforated duodenal ulcer ([place])    Stroke (HCC)     Medications:  PTA: Prescribed Eliquis,  however pt states he never had rx filled b/c it was too expensive.  Inpatient: heparin infusion (8/2 > ), warfarin (8/2 > ) Allergies: NKDA  Assessment: 71 year old male admitted with left lower leg arterial embolism. Now s/p lower extremity angiography. Pharmacy consulted to re-start heparin.   Date Time HL Rate/Comment 8/2 1745 0.58 Therapeutic x 1   Date INR Dose  8/2 1.1 7.5mg      Goal of Therapy:  INR 2-3 Heparin level 0.3-0.7 units/ml Monitor platelets by anticoagulation protocol: Yes   Plan:  Give warfarin 7.5mg  PO x1 at 1600 Continue heparin at 1800 units/hr. Confirmatory 8-hour heparin level. Continue to monitor H&H and platelets and CBC daily Continue heparin drip until INR consecutively therapeutic for at least 2 days.   [place] Pharmacist 09/22/2021 11:27 AM

## 2021-09-22 NOTE — Progress Notes (Signed)
PROGRESS NOTE    Gregory Lane  QIH:[id number] DOB: November 09, 1950 DOA: 09/21/2021 PCP: Cletis Athens, MD   Brief Narrative:  This 71 years old male with PMH significant for chronic kidney disease, ongoing tobacco abuse, CVA, atrial flutter on Eliquis, dyslipidemia, COPD, osteoarthritis who presented in the ED with acute onset of left leg pain that started 4 hours before he arrived to the ED.  Patient has taken ibuprofen without any improvement.  Imaging in the ED:  lower extremity venous duplex negative for DVT.  Patient has Abnormal  ABI and was noted to have diminished capillary refill.  Vascular surgery was consulted. Patient underwent abdominal aortogram, mechanical thrombectomy left SFA, left profunda femoris, and distal left common femoral artery.  Tolerated well.  Assessment & Plan:   Principal Problem:   PAD (peripheral artery disease) (HCC) Active Problems:   Essential hypertension   GERD without esophagitis   Dyslipidemia   Depression   Atrial flutter (HCC)   Chronic obstructive pulmonary disease (COPD) (HCC)   Coronary artery disease  Peripheral arterial disease: Patient presented with significant left lower extremity ischemia with ABI of 0.4 Vascular surgery was consulted.   Patient started on Pletal. Eliquis was switched with IV heparin.. Patient underwent abdominal aortogram, mechanical thrombectomy left SFA, left profunda femoris and distal common femoral artery.  Postoperative day 1. Follow-up vascular surgery recommendations. Adequate pain control with pain medications  Coronary artery disease: Continue Lopressor and Imdur.  COPD: Continue home inhalers, not in any acute exacerbation.   Atrial fibrillation/flutter Eliquis was switched with IV heparin. Continue metoprolol, Cardizem. Patient could not afford Xarelto. Anticoagulation changed with Coumadin, starting today  Major depression: Continue Wellbutrin XL and Prozac    Dyslipidemia: Continue Lipitor  20 mg daily  GERD without esophagitis Continue Protonix.   Essential hypertension: Continue metoprolol 25 mg twice daily Continue Cardizem 120 mg twice daily Continue amlodipine 10 mg daily Lisinopril 40 mg daily   DVT prophylaxis: Coumadin/IV heparin Code Status: Full code Family Communication: (No family at bedside Disposition Plan:  Status is: Inpatient Remains inpatient appropriate because:  Patient admitted with acute lower extremity ischemia underwent angiogram and thrombectomy.  Tolerated well patient started on Coumadin for anticoagulation.  Anticipated discharge back to SNF in 1 to 2 days.     Consultants:  Cardiology Vascular surgery  Procedures: Abdominal aortogram, mechanical thrombectomy left lower extremity Antimicrobials:  Anti-infectives (From admission, onward)    Start     Dose/Rate Route Frequency Ordered Stop   09/21/21 0810  ceFAZolin (ANCEF) IVPB 1 g/50 mL premix        over 30 Minutes  Continuous PRN 09/21/21 0815 09/21/21 1323   09/21/21 0730  ceFAZolin (ANCEF) IVPB 2g/100 mL premix        2 g 200 mL/hr over 30 Minutes Intravenous On call to O.R. 09/21/21 0715 09/21/21 0840       Subjective: Patient was seen and examined at bedside.  Overnight events noted.   Patient underwent aortogram and mechanical thrombectomy, tolerated well.  Patient feels better  Objective: Vitals:   09/22/21 0400 09/22/21 0439 09/22/21 0809 09/22/21 1233  BP:   (!) 118/53 112/87  Pulse:   (!) 50 (!) 50  Resp: 15 20 19 16   Temp:   97.6 F (36.4 C) 97.6 F (36.4 C)  TempSrc:   Oral Oral  SpO2:   95% 99%  Weight:      Height:        Intake/Output Summary (Last 24 hours) at  09/22/2021 1518 Last data filed at 09/22/2021 0721 Gross per 24 hour  Intake 2609.12 ml  Output 550 ml  Net 2059.12 ml   Filed Weights   09/20/21 2330  Weight: 108 kg    Examination:  General exam: Appears comfortable, not in any acute distress.  Deconditioned Respiratory system:  CTA bilaterally, no wheezing, no crackles, normal respiratory effort. Cardiovascular system: S1 & S2 heard, regular rate and rhythm, no murmur. Gastrointestinal system: Abdomen is soft, non tender, non distended, BS+ Central nervous system: Alert and oriented x3. No focal neurological deficits. Extremities: Reports left lower extremity pain, s/p aortogram and thrombectomy. Skin: No rashes, lesions or ulcers Psychiatry: Judgement and insight appear normal. Mood & affect appropriate.     Data Reviewed: I have personally reviewed following labs and imaging studies  CBC: Recent Labs  Lab 09/21/21 0254 09/21/21 1745 09/22/21 0205  WBC 10.6* 8.1 8.4  NEUTROABS 9.3*  --   --   HGB 15.5 13.4 12.3*  HCT 46.0 39.8 36.3*  MCV 102.9* 102.1* 102.8*  PLT 199 171 485   Basic Metabolic Panel: Recent Labs  Lab 09/21/21 0254 09/21/21 1745  NA 138 139  K 4.7 4.3  CL 110 113*  CO2 20* 22  GLUCOSE 117* 138*  BUN 30* 22  CREATININE 1.21 0.85  CALCIUM 8.8* 8.3*   GFR: Estimated Creatinine Clearance: 99.7 mL/min (by C-G formula based on SCr of 0.85 mg/dL). Liver Function Tests: No results for input(s): "AST", "ALT", "ALKPHOS", "BILITOT", "PROT", "ALBUMIN" in the last 168 hours. No results for input(s): "LIPASE", "AMYLASE" in the last 168 hours. No results for input(s): "AMMONIA" in the last 168 hours. Coagulation Profile: Recent Labs  Lab 09/21/21 0254  INR 1.1   Cardiac Enzymes: No results for input(s): "CKTOTAL", "CKMB", "CKMBINDEX", "TROPONINI" in the last 168 hours. BNP (last 3 results) No results for input(s): "PROBNP" in the last 8760 hours. HbA1C: No results for input(s): "HGBA1C" in the last 72 hours. CBG: No results for input(s): "GLUCAP" in the last 168 hours. Lipid Profile: No results for input(s): "CHOL", "HDL", "LDLCALC", "TRIG", "CHOLHDL", "LDLDIRECT" in the last 72 hours. Thyroid Function Tests: No results for input(s): "TSH", "T4TOTAL", "FREET4", "T3FREE",  "THYROIDAB" in the last 72 hours. Anemia Panel: No results for input(s): "VITAMINB12", "FOLATE", "FERRITIN", "TIBC", "IRON", "RETICCTPCT" in the last 72 hours. Sepsis Labs: No results for input(s): "PROCALCITON", "LATICACIDVEN" in the last 168 hours.  No results found for this or any previous visit (from the past 240 hour(s)).   Radiology Studies: ECHOCARDIOGRAM COMPLETE  Result Date: 09/21/2021    ECHOCARDIOGRAM REPORT   Patient Name:   WILLIE PLAIN Date of Exam: 09/21/2021 Medical Rec #:  [id number]        Height:       73.0 in Accession #:    [id number]       Weight:       238.1 lb Date of Birth:  Mar 15, 1950        BSA:          2.317 m Patient Age:    33 years         BP:           144/61 mmHg Patient Gender: M                HR:           52 bpm. Exam Location:  [facility] Procedure: 2D Echo, Color Doppler and Cardiac Doppler Indications:     I48.92  Atrial Flutter  History:         Patient has prior history of Echocardiogram examinations, most                  recent 02/17/2017. Previous Myocardial Infarction, Stroke; Risk                  Factors:Hypertension and Current Smoker.  Sonographer:     Charmayne Sheer Referring Phys:  [id number] [place] TANG Diagnosing Phys: Yolonda Kida MD  Sonographer Comments: Technically difficult study due to poor echo windows and suboptimal parasternal window. IMPRESSIONS  1. Left ventricular ejection fraction, by estimation, is 55 to 60%. The left ventricle has normal function. The left ventricle has no regional wall motion abnormalities. The left ventricular internal cavity size was moderately dilated. There is mild left ventricular hypertrophy. Left ventricular diastolic parameters were normal.  2. Right ventricular systolic function is normal. The right ventricular size is normal.  3. Left atrial size was mild to moderately dilated.  4. The mitral valve is normal in structure. Trivial mitral valve regurgitation.  5. The aortic valve is grossly normal. Aortic valve  regurgitation is not visualized. FINDINGS  Left Ventricle: Left ventricular ejection fraction, by estimation, is 55 to 60%. The left ventricle has normal function. The left ventricle has no regional wall motion abnormalities. The left ventricular internal cavity size was moderately dilated. There is mild left ventricular hypertrophy. Left ventricular diastolic parameters were normal. Right Ventricle: The right ventricular size is normal. No increase in right ventricular wall thickness. Right ventricular systolic function is normal. Left Atrium: Left atrial size was mild to moderately dilated. Right Atrium: Right atrial size was normal in size. Pericardium: There is no evidence of pericardial effusion. Mitral Valve: The mitral valve is normal in structure. Trivial mitral valve regurgitation. Tricuspid Valve: The tricuspid valve is normal in structure. Tricuspid valve regurgitation is not demonstrated. Aortic Valve: The aortic valve is grossly normal. Aortic valve regurgitation is not visualized. Aortic valve mean gradient measures 6.0 mmHg. Aortic valve peak gradient measures 12.4 mmHg. Aortic valve area, by VTI measures 3.58 cm. Pulmonic Valve: The pulmonic valve was normal in structure. Pulmonic valve regurgitation is not visualized. Aorta: The ascending aorta was not well visualized. IAS/Shunts: No atrial level shunt detected by color flow Doppler.  LEFT VENTRICLE PLAX 2D LVIDd:         5.63 cm      Diastology LVIDs:         3.80 cm      LV e' medial:    8.92 cm/s LV PW:         1.40 cm      LV E/e' medial:  9.9 LV IVS:        0.95 cm      LV e' lateral:   12.90 cm/s LVOT diam:     2.40 cm      LV E/e' lateral: 6.8 LV SV:         112 LV SV Index:   48 LVOT Area:     4.52 cm  LV Volumes (MOD) LV vol d, MOD A4C: 151.0 ml LV vol s, MOD A4C: 78.7 ml LV SV MOD A4C:     151.0 ml RIGHT VENTRICLE RV Basal diam:  3.83 cm LEFT ATRIUM             Index        RIGHT ATRIUM  Index LA diam:        4.90 cm 2.11 cm/m    RA Area:     15.10 cm LA Vol (A2C):   52.2 ml 22.53 ml/m  RA Volume:   35.00 ml  15.10 ml/m LA Vol (A4C):   60.2 ml 25.98 ml/m LA Biplane Vol: 56.5 ml 24.38 ml/m  AORTIC VALVE                     PULMONIC VALVE AV Area (Vmax):    3.47 cm      PV Vmax:       1.00 m/s AV Area (Vmean):   3.52 cm      PV Peak grad:  4.0 mmHg AV Area (VTI):     3.58 cm AV Vmax:           176.00 cm/s AV Vmean:          110.000 cm/s AV VTI:            0.313 m AV Peak Grad:      12.4 mmHg AV Mean Grad:      6.0 mmHg LVOT Vmax:         135.00 cm/s LVOT Vmean:        85.500 cm/s LVOT VTI:          0.248 m LVOT/AV VTI ratio: 0.79  AORTA Ao Root diam: 3.10 cm MITRAL VALVE MV Area (PHT): 3.36 cm    SHUNTS MV Decel Time: 226 msec    Systemic VTI:  0.25 m MV E velocity: 88.30 cm/s  Systemic Diam: 2.40 cm MV A velocity: 65.60 cm/s MV E/A ratio:  1.35 Yolonda Kida MD Electronically signed by Yolonda Kida MD Signature Date/Time: 09/21/2021/6:34:13 PM    Final    PERIPHERAL VASCULAR CATHETERIZATION  Result Date: 09/21/2021 See surgical note for result.  US Venous Img Lower Unilateral Left  Result Date: 09/21/2021 CLINICAL DATA:  Left lower extremity pain. EXAM: Left LOWER EXTREMITY VENOUS DOPPLER ULTRASOUND TECHNIQUE: Gray-scale sonography with compression, as well as color and duplex ultrasound, were performed to evaluate the deep venous system(s) from the level of the common femoral vein through the popliteal and proximal calf veins. COMPARISON:  None Available. FINDINGS: VENOUS Normal compressibility of the common femoral, superficial femoral, and popliteal veins, as well as the visualized calf veins. Visualized portions of profunda femoral vein and great saphenous vein unremarkable. No filling defects to suggest DVT on grayscale or color Doppler imaging. Doppler waveforms show normal direction of venous flow, normal respiratory plasticity and response to augmentation. Limited views of the contralateral common femoral vein  are unremarkable. OTHER None. Limitations: none IMPRESSION: Negative. Electronically Signed   By: Anner Crete M.D.   On: 09/21/2021 01:30    Scheduled Meds:  amLODipine  10 mg Oral Q1400   atorvastatin  20 mg Oral Daily   buPROPion  300 mg Oral Daily   cilostazol  100 mg Oral BID   diltiazem  120 mg Oral Q12H   FLUoxetine  40 mg Oral Daily   isosorbide mononitrate  60 mg Oral Daily   lisinopril  40 mg Oral Daily   metoprolol tartrate  25 mg Oral BID   mometasone-formoterol  2 puff Inhalation BID   nicotine  7 mg Transdermal Daily   pantoprazole  40 mg Oral Daily   potassium chloride SA  20 mEq Oral BID   sodium chloride flush  3 mL Intravenous Q12H   warfarin  7.5 mg Oral ONCE-1600   Warfarin - Pharmacist Dosing Inpatient   Does not apply q1600   Continuous Infusions:  sodium chloride 100 mL/hr at 09/22/21 0236   sodium chloride     heparin 1,800 Units/hr (09/22/21 0236)     LOS: 1 day    Time spent: 35 mins    Gladys Gutman, MD [facility]   If 7PM-7AM, please contact night-coverage

## 2021-09-22 NOTE — Progress Notes (Signed)
Gregory Lane for IV heparin Indication: arterial embolism of the left leg  No Known Allergies  Patient Measurements: Height: 6\' 1"  (185.4 cm) Weight: 108 kg (238 lb 1.6 oz) IBW/kg (Calculated) : 79.9 Heparin Dosing Weight: 102.3 kg  Vital Signs: Temp: 97.9 F (36.6 C) (08/02 2327) Temp Source: Oral (08/02 2327) BP: 113/54 (08/02 2327) Pulse Rate: 50 (08/02 2327)  Labs: Recent Labs    09/21/21 0254 09/21/21 1745 09/22/21 0205  HGB 15.5 13.4 12.3*  HCT 46.0 39.8 36.3*  PLT 199 171 162  APTT 29  --   --   LABPROT 14.0  --   --   INR 1.1  --   --   HEPARINUNFRC <0.10* 0.58 0.65  CREATININE 1.21 0.85  --      Estimated Creatinine Clearance: 99.7 mL/min (by C-G formula based on SCr of 0.85 mg/dL).   Medical History: Past Medical History:  Diagnosis Date   Aortic atherosclerosis ([place]) 02/16/2017   Arthritis    Blind left eye    Hypertension    Low TSH level 02/17/2017   Myocardial infarction ([place])    Perforated duodenal ulcer ([place])    Stroke (HCC)     Medications:  Medications Prior to Admission  Medication Sig Dispense Refill Last Dose   amLODipine (NORVASC) 10 MG tablet Take 1 tablet (10 mg total) by mouth daily at 2 PM. 90 tablet 3 09/21/2021   atorvastatin (LIPITOR) 40 MG tablet Take 0.5 tablets (20 mg total) by mouth daily. 90 tablet 3 09/21/2021   diltiazem (CARDIZEM SR) 120 MG 12 hr capsule Take 1 capsule (120 mg total) by mouth every 12 (twelve) hours. 180 capsule 3 09/21/2021   FLUoxetine (PROZAC) 20 MG capsule Take 2 capsules (40 mg total) by mouth daily. 60 capsule 0 09/21/2021   furosemide (LASIX) 20 MG tablet Take 1 tablet (20 mg total) by mouth daily. 90 tablet 3 09/21/2021   lisinopril (ZESTRIL) 40 MG tablet Take 1 tablet (40 mg total) by mouth daily. 90 tablet 3 09/21/2021   metoprolol tartrate (LOPRESSOR) 25 MG tablet Take 1 tablet (25 mg total) by mouth 2 (two) times daily. 180 tablet 3 09/21/2021   pantoprazole (PROTONIX)  40 MG tablet Take 1 tablet (40 mg total) by mouth daily. 90 tablet 3 09/21/2021   potassium chloride SA (KLOR-CON M) 20 MEQ tablet Take 1 tablet (20 mEq total) by mouth 2 (two) times daily. 180 tablet 3 09/21/2021   albuterol (PROVENTIL HFA;VENTOLIN HFA) 108 (90 Base) MCG/ACT inhaler Inhale 2 puffs into the lungs every 6 (six) hours as needed for wheezing or shortness of breath. (Patient not taking: Reported on 09/21/2021) 1 Inhaler 1 Not Taking   apixaban (ELIQUIS) 5 MG TABS tablet Take 1 tablet (5 mg total) by mouth 2 (two) times daily. (Patient not taking: Reported on 09/21/2021) 60 tablet 6 Not Taking   budesonide-formoterol (SYMBICORT) 160-4.5 MCG/ACT inhaler TAKE 2 PUFFS BY MOUTH TWICE A DAY (Patient not taking: Reported on 09/21/2021) 1 each 5 Not Taking   buPROPion (WELLBUTRIN XL) 150 MG 24 hr tablet Take 2 tablets (300 mg total) by mouth daily. (Patient not taking: Reported on 09/21/2021) 90 tablet 1 Not Taking   isosorbide mononitrate (IMDUR) 60 MG 24 hr tablet Take 60 mg by mouth daily. (Patient not taking: Reported on 09/21/2021)   Not Taking   Scheduled:   amLODipine  10 mg Oral Q1400   atorvastatin  20 mg Oral Daily   buPROPion  300 mg Oral Daily   cilostazol  100 mg Oral BID   diltiazem  120 mg Oral Q12H   FLUoxetine  40 mg Oral Daily   isosorbide mononitrate  60 mg Oral Daily   lisinopril  40 mg Oral Daily   metoprolol tartrate  25 mg Oral BID   mometasone-formoterol  2 puff Inhalation BID   nicotine  7 mg Transdermal Daily   pantoprazole  40 mg Oral Daily   potassium chloride SA  20 mEq Oral BID   sodium chloride flush  3 mL Intravenous Q12H   Infusions:   sodium chloride 100 mL/hr at 09/22/21 0236   sodium chloride     heparin 1,800 Units/hr (09/22/21 0236)    PTA Anticoagulation: PTA med list says pt is on Eliquis 5 mg PO BID,  however pt states he never had rx filled b/c it was too expensive.   Assessment: 71 year old male admitted with left lower leg arterial embolism. Now s/p  lower extremity angiography. Pharmacy consulted to re-start heparin.  CrCl = 70.1 ml/min  Baseline HL < 0.10. Baseline CBC stable.   8/2 1745 HL= 0.58 therapeutic  Goal of Therapy:  Heparin level 0.3-0.7 units/ml Monitor platelets by anticoagulation protocol: Yes   Plan:  8/3:  HL @ 0205 = 0.65, therapeutic X 2 Will continue pt on current rate and recheck HL on 8/4 with AM labs.  [place] Pharmacist 09/22/2021

## 2021-09-22 NOTE — TOC Benefit Eligibility Note (Signed)
Patient Teacher, English as a foreign language completed.    The patient is currently admitted and upon discharge could be taking Eliquis 5 mg.  The current 30 day co-pay is $442.00 due to a $395.00 deductible.  Will be $47.00 once deductible is met.   The patient is insured through [place], [place] Patient Advocate Specialist [place] Patient Advocate Team Direct Number: [phone]  Fax: [phone]

## 2021-09-22 NOTE — Telephone Encounter (Signed)
Pharmacy Patient Advocate Encounter  Insurance verification completed.    The patient is insured through [employer] Part [employer]   The patient is currently admitted and ran test claims for the following: Eliquis.  Copays and coinsurance results were relayed to Inpatient clinical team.

## 2021-09-22 NOTE — Progress Notes (Signed)
[facility] Cardiology    SUBJECTIVE: Denies pain .Ambulated a few steps . SOB better s/p tap   Vitals:   09/22/21 0439 09/22/21 0809 09/22/21 1233 09/22/21 1556  BP:  (!) 118/53 112/87 (!) 93/36  Pulse:  (!) 50 (!) 50 (!) 51  Resp: 20 19 16 16   Temp:  97.6 F (36.4 C) 97.6 F (36.4 C) 97.8 F (36.6 C)  TempSrc:  Oral Oral Oral  SpO2:  95% 99% 97%  Weight:      Height:         Intake/Output Summary (Last 24 hours) at 09/22/2021 1948 Last data filed at 09/22/2021 0109 Gross per 24 hour  Intake 873.12 ml  Output 400 ml  Net 473.12 ml      PHYSICAL EXAM  General: Well developed, well nourished, in no acute distress HEENT:  Normocephalic and atramatic Neck:  No JVD.  Lungs: Clear bilaterally to auscultation and percussion. Heart: HRRR . Normal S1 and S2 without gallops or murmurs.  Abdomen: Bowel sounds are positive, abdomen soft and non-tender  Msk:  Back normal, normal gait. Normal strength and tone for age. Extremities: No clubbing, cyanosis or edema.   Neuro: Alert and oriented X 3. Psych:  Good affect, responds appropriately   LABS: Basic Metabolic Panel: Recent Labs    09/21/21 0254 09/21/21 1745  NA 138 139  K 4.7 4.3  CL 110 113*  CO2 20* 22  GLUCOSE 117* 138*  BUN 30* 22  CREATININE 1.21 0.85  CALCIUM 8.8* 8.3*   Liver Function Tests: No results for input(s): "AST", "ALT", "ALKPHOS", "BILITOT", "PROT", "ALBUMIN" in the last 72 hours. No results for input(s): "LIPASE", "AMYLASE" in the last 72 hours. CBC: Recent Labs    09/21/21 0254 09/21/21 1745 09/22/21 0205  WBC 10.6* 8.1 8.4  NEUTROABS 9.3*  --   --   HGB 15.5 13.4 12.3*  HCT 46.0 39.8 36.3*  MCV 102.9* 102.1* 102.8*  PLT 199 171 162   Cardiac Enzymes: No results for input(s): "CKTOTAL", "CKMB", "CKMBINDEX", "TROPONINI" in the last 72 hours. BNP: Invalid input(s): "POCBNP" D-Dimer: No results for input(s): "DDIMER" in the last 72 hours. Hemoglobin A1C: No results for input(s): "HGBA1C"  in the last 72 hours. Fasting Lipid Panel: No results for input(s): "CHOL", "HDL", "LDLCALC", "TRIG", "CHOLHDL", "LDLDIRECT" in the last 72 hours. Thyroid Function Tests: No results for input(s): "TSH", "T4TOTAL", "T3FREE", "THYROIDAB" in the last 72 hours.  Invalid input(s): "FREET3" Anemia Panel: No results for input(s): "VITAMINB12", "FOLATE", "FERRITIN", "TIBC", "IRON", "RETICCTPCT" in the last 72 hours.  ECHOCARDIOGRAM COMPLETE  Result Date: 09/21/2021    ECHOCARDIOGRAM REPORT   Patient Name:   Gregory Lane Date of Exam: 09/21/2021 Medical Rec #:  [id number]        Height:       73.0 in Accession #:    [id number]       Weight:       238.1 lb Date of Birth:  04-20-1950        BSA:          2.317 m Patient Age:    71 years         BP:           144/61 mmHg Patient Gender: M                HR:           52 bpm. Exam Location:  [facility] Procedure: 2D Echo, Color Doppler and Cardiac Doppler  Indications:     I48.92 Atrial Flutter  History:         Patient has prior history of Echocardiogram examinations, most                  recent 02/17/2017. Previous Myocardial Infarction, Stroke; Risk                  Factors:Hypertension and Current Smoker.  Sonographer:     Charmayne Sheer Referring Phys:  [id number] [place] TANG Diagnosing Phys: Yolonda Kida MD  Sonographer Comments: Technically difficult study due to poor echo windows and suboptimal parasternal window. IMPRESSIONS  1. Left ventricular ejection fraction, by estimation, is 55 to 60%. The left ventricle has normal function. The left ventricle has no regional wall motion abnormalities. The left ventricular internal cavity size was moderately dilated. There is mild left ventricular hypertrophy. Left ventricular diastolic parameters were normal.  2. Right ventricular systolic function is normal. The right ventricular size is normal.  3. Left atrial size was mild to moderately dilated.  4. The mitral valve is normal in structure. Trivial mitral valve  regurgitation.  5. The aortic valve is grossly normal. Aortic valve regurgitation is not visualized. FINDINGS  Left Ventricle: Left ventricular ejection fraction, by estimation, is 55 to 60%. The left ventricle has normal function. The left ventricle has no regional wall motion abnormalities. The left ventricular internal cavity size was moderately dilated. There is mild left ventricular hypertrophy. Left ventricular diastolic parameters were normal. Right Ventricle: The right ventricular size is normal. No increase in right ventricular wall thickness. Right ventricular systolic function is normal. Left Atrium: Left atrial size was mild to moderately dilated. Right Atrium: Right atrial size was normal in size. Pericardium: There is no evidence of pericardial effusion. Mitral Valve: The mitral valve is normal in structure. Trivial mitral valve regurgitation. Tricuspid Valve: The tricuspid valve is normal in structure. Tricuspid valve regurgitation is not demonstrated. Aortic Valve: The aortic valve is grossly normal. Aortic valve regurgitation is not visualized. Aortic valve mean gradient measures 6.0 mmHg. Aortic valve peak gradient measures 12.4 mmHg. Aortic valve area, by VTI measures 3.58 cm. Pulmonic Valve: The pulmonic valve was normal in structure. Pulmonic valve regurgitation is not visualized. Aorta: The ascending aorta was not well visualized. IAS/Shunts: No atrial level shunt detected by color flow Doppler.  LEFT VENTRICLE PLAX 2D LVIDd:         5.63 cm      Diastology LVIDs:         3.80 cm      LV e' medial:    8.92 cm/s LV PW:         1.40 cm      LV E/e' medial:  9.9 LV IVS:        0.95 cm      LV e' lateral:   12.90 cm/s LVOT diam:     2.40 cm      LV E/e' lateral: 6.8 LV SV:         112 LV SV Index:   48 LVOT Area:     4.52 cm  LV Volumes (MOD) LV vol d, MOD A4C: 151.0 ml LV vol s, MOD A4C: 78.7 ml LV SV MOD A4C:     151.0 ml RIGHT VENTRICLE RV Basal diam:  3.83 cm LEFT ATRIUM             Index         RIGHT ATRIUM  Index LA diam:        4.90 cm 2.11 cm/m   RA Area:     15.10 cm LA Vol (A2C):   52.2 ml 22.53 ml/m  RA Volume:   35.00 ml  15.10 ml/m LA Vol (A4C):   60.2 ml 25.98 ml/m LA Biplane Vol: 56.5 ml 24.38 ml/m  AORTIC VALVE                     PULMONIC VALVE AV Area (Vmax):    3.47 cm      PV Vmax:       1.00 m/s AV Area (Vmean):   3.52 cm      PV Peak grad:  4.0 mmHg AV Area (VTI):     3.58 cm AV Vmax:           176.00 cm/s AV Vmean:          110.000 cm/s AV VTI:            0.313 m AV Peak Grad:      12.4 mmHg AV Mean Grad:      6.0 mmHg LVOT Vmax:         135.00 cm/s LVOT Vmean:        85.500 cm/s LVOT VTI:          0.248 m LVOT/AV VTI ratio: 0.79  AORTA Ao Root diam: 3.10 cm MITRAL VALVE MV Area (PHT): 3.36 cm    SHUNTS MV Decel Time: 226 msec    Systemic VTI:  0.25 m MV E velocity: 88.30 cm/s  Systemic Diam: 2.40 cm MV A velocity: 65.60 cm/s MV E/A ratio:  1.35 Yolonda Kida MD Electronically signed by Yolonda Kida MD Signature Date/Time: 09/21/2021/6:34:13 PM    Final    PERIPHERAL VASCULAR CATHETERIZATION  Result Date: 09/21/2021 See surgical note for result.  US Venous Img Lower Unilateral Left  Result Date: 09/21/2021 CLINICAL DATA:  Left lower extremity pain. EXAM: Left LOWER EXTREMITY VENOUS DOPPLER ULTRASOUND TECHNIQUE: Gray-scale sonography with compression, as well as color and duplex ultrasound, were performed to evaluate the deep venous system(s) from the level of the common femoral vein through the popliteal and proximal calf veins. COMPARISON:  None Available. FINDINGS: VENOUS Normal compressibility of the common femoral, superficial femoral, and popliteal veins, as well as the visualized calf veins. Visualized portions of profunda femoral vein and great saphenous vein unremarkable. No filling defects to suggest DVT on grayscale or color Doppler imaging. Doppler waveforms show normal direction of venous flow, normal respiratory plasticity and response to  augmentation. Limited views of the contralateral common femoral vein are unremarkable. OTHER None. Limitations: none IMPRESSION: Negative. Electronically Signed   By: Anner Crete M.D.   On: 09/21/2021 01:30     Echo NLVF 55%  TELEMETRY: Aflutter 80 nsstw:  ASSESSMENT AND PLAN:  Principal Problem:   PAD (peripheral artery disease) (HCC) Active Problems:   Essential hypertension   GERD without esophagitis   Dyslipidemia   Depression   Atrial flutter (HCC)   Chronic obstructive pulmonary disease (COPD) (HCC)   Coronary artery disease    Plan Transition from heparin to coumadin for anticoug Rate control for AFLUTTER /coumadin Statin for hyperlipidemia PPI protonix for GERD Inhalers for COPD S/P tap now sob /much improved Continue PT/OT  Yolonda Kida, MD 09/22/2021 7:48 PM

## 2021-09-23 DIAGNOSIS — I739 Peripheral vascular disease, unspecified: Secondary | ICD-10-CM | POA: Diagnosis not present

## 2021-09-23 LAB — BASIC METABOLIC PANEL
Anion gap: 3 — ABNORMAL LOW (ref 5–15)
BUN: 19 mg/dL (ref 8–23)
CO2: 20 mmol/L — ABNORMAL LOW (ref 22–32)
Calcium: 8.4 mg/dL — ABNORMAL LOW (ref 8.9–10.3)
Chloride: 114 mmol/L — ABNORMAL HIGH (ref 98–111)
Creatinine, Ser: 0.88 mg/dL (ref 0.61–1.24)
GFR, Estimated: >60 mL/min (ref >60)
Glucose, Bld: 126 mg/dL — ABNORMAL HIGH (ref 70–99)
Potassium: 4 mmol/L (ref 3.5–5.1)
Sodium: 137 mmol/L (ref 135–145)

## 2021-09-23 LAB — MAGNESIUM: Magnesium: 1.9 mg/dL (ref 1.7–2.4)

## 2021-09-23 LAB — CBC
HCT: 35.7 % — ABNORMAL LOW (ref 39.0–52.0)
Hemoglobin: 12.1 g/dL — ABNORMAL LOW (ref 13.0–17.0)
MCH: 34.5 pg — ABNORMAL HIGH (ref 26.0–34.0)
MCHC: 33.9 g/dL (ref 30.0–36.0)
MCV: 101.7 fL — ABNORMAL HIGH (ref 80.0–100.0)
Platelets: 142 10*3/uL — ABNORMAL LOW (ref 150–400)
RBC: 3.51 MIL/uL — ABNORMAL LOW (ref 4.22–5.81)
RDW: 12.3 % (ref 11.5–15.5)
WBC: 7.8 10*3/uL (ref 4.0–10.5)
nRBC: 0 % (ref 0.0–0.2)

## 2021-09-23 LAB — PROTIME-INR
INR: 1.1 (ref 0.8–1.2)
Prothrombin Time: 14.3 seconds (ref 11.4–15.2)

## 2021-09-23 LAB — PHOSPHORUS: Phosphorus: 3.6 mg/dL (ref 2.5–4.6)

## 2021-09-23 LAB — HEPARIN LEVEL (UNFRACTIONATED): Heparin Unfractionated: 0.6 IU/mL (ref 0.30–0.70)

## 2021-09-23 MED ORDER — WARFARIN SODIUM 7.5 MG PO TABS
7.5000 mg | ORAL_TABLET | Freq: Once | ORAL | Status: AC
  Administered 2021-09-23: 7.5 mg via ORAL
  Filled 2021-09-23: qty 1

## 2021-09-23 NOTE — Progress Notes (Signed)
[place] for IV heparin Indication: arterial embolism of the left leg  No Known Allergies  Patient Measurements: Height: 6\' 1"  (185.4 cm) Weight: 108 kg (238 lb 1.6 oz) IBW/kg (Calculated) : 79.9 Heparin Dosing Weight: 102.3 kg  Vital Signs: Temp: 97.5 F (36.4 C) (08/04 0407) BP: 125/50 (08/04 0407) Pulse Rate: 48 (08/04 0407)  Labs: Recent Labs    09/21/21 0254 09/21/21 1745 09/22/21 0205 09/23/21 0436  HGB 15.5 13.4 12.3* 12.1*  HCT 46.0 39.8 36.3* 35.7*  PLT 199 171 162 142*  APTT 29  --   --   --   LABPROT 14.0  --   --  14.3  INR 1.1  --   --  1.1  HEPARINUNFRC <0.10* 0.58 0.65 0.60  CREATININE 1.21 0.85  --  0.88     Estimated Creatinine Clearance: 96.3 mL/min (by C-G formula based on SCr of 0.88 mg/dL).   Medical History: Past Medical History:  Diagnosis Date   Aortic atherosclerosis ([place]) 02/16/2017   Arthritis    Blind left eye    Hypertension    Low TSH level 02/17/2017   Myocardial infarction ([place])    Perforated duodenal ulcer ([place])    Stroke (HCC)     Medications:  PTA: Prescribed Eliquis,  however pt states he never had rx filled b/c it was too expensive.  Inpatient: heparin infusion (8/2 > ), warfarin (8/2 > ) Allergies: NKDA  Assessment: 71 year old male admitted with left lower leg arterial embolism. Now s/p lower extremity angiography. Pharmacy consulted to re-start heparin.   Date Time HL Rate/Comment 8/2 1745 0.58 Therapeutic x 1   Date INR Dose  8/2 1.1 7.5mg      Goal of Therapy:  INR 2-3 Heparin level 0.3-0.7 units/ml Monitor platelets by anticoagulation protocol: Yes   Plan:  8/4:  HL @ 0436 = 0.6, therapeutic X 3 Will continue pt on current rate and recheck HL on 8/5 with AM labs.   Levetta Bognar D Clinical Pharmacist 09/23/2021 5:10 AM

## 2021-09-23 NOTE — Progress Notes (Signed)
[place] for warfarin + heparin infusion Indication: arterial embolism of the left leg  No Known Allergies  Patient Measurements: Height: 6\' 1"  (185.4 cm) Weight: 108 kg (238 lb 1.6 oz) IBW/kg (Calculated) : 79.9 Heparin Dosing Weight: 102.3 kg  Vital Signs: Temp: 97.7 F (36.5 C) (08/04 0738) Temp Source: Oral (08/04 0738) BP: 153/60 (08/04 0738) Pulse Rate: 51 (08/04 0738)  Labs: Recent Labs    09/21/21 0254 09/21/21 1745 09/22/21 0205 09/23/21 0436  HGB 15.5 13.4 12.3* 12.1*  HCT 46.0 39.8 36.3* 35.7*  PLT 199 171 162 142*  APTT 29  --   --   --   LABPROT 14.0  --   --  14.3  INR 1.1  --   --  1.1  HEPARINUNFRC <0.10* 0.58 0.65 0.60  CREATININE 1.21 0.85  --  0.88     Estimated Creatinine Clearance: 96.3 mL/min (by C-G formula based on SCr of 0.88 mg/dL).   Medical History: Past Medical History:  Diagnosis Date   Aortic atherosclerosis ([place]) 02/16/2017   Arthritis    Blind left eye    Hypertension    Low TSH level 02/17/2017   Myocardial infarction ([place])    Perforated duodenal ulcer ([place])    Stroke (HCC)     Medications:  PTA: Prescribed Eliquis,  however pt states he never had rx filled b/c it was too expensive.  Inpatient: heparin infusion (8/2 > ), warfarin (8/2 > ) Allergies: NKDA  Assessment: 71 year old male admitted with left lower leg arterial embolism. Now s/p lower extremity angiography. Pharmacy consulted to re-start heparin.   Date Time HL Rate/Comment 8/2 1745 0.58 Therapeutic x 1 8/3 0205 0.65 Therapeutic x 2 8/4 0436 0.60  Therapeutic x 3   Date INR Dose  8/3 1.1  7.5mg   8/4 1.1 7.5mg      Goal of Therapy:  INR 2-3 Heparin level 0.3-0.7 units/ml Monitor platelets by anticoagulation protocol: Yes   Plan:  Give warfarin 7.5mg  x1 Continue heparin at 1800 units/hr. Confirmatory 8-hour heparin level. Continue to monitor H&H and platelets and CBC daily Continue heparin drip until INR  consecutively therapeutic for at least 2 days.   [place] Pharmacist 09/23/2021 8:17 AM

## 2021-09-23 NOTE — Progress Notes (Signed)
Physical Therapy Treatment Patient Details Name: Gregory Lane MRN: [id number] DOB: 06-Jan-1951 Today's Date: 09/23/2021   History of Present Illness Pt is a 71 y.o. male presenting to hospital 09/20/21 for evaluation of acute on chronic L leg pain.  Pt admitted with PAD with L LE ischemia with ABI of 0.4.  S/p mechanical thrombectomy 8/2 d/t atherosclerotic occlusive disease B LE's with rest pain and ischemic changes of the L foot.  PMH includes aflutter on eliquis, htn, HLD, CVA, COPD, CKD, blind L eye, MI.    PT Comments    The pt demonstrates significant gait deviations, see below, that increase his risk for falls. The pt is requiring RW at this time for safe mobility and balance with functional activity. He is self limiting with gait distances 2/2 hx of R knee buckling and choosing to be "safe than sorry." Pt will continue to follow.     Recommendations for follow up therapy are one component of a multi-disciplinary discharge planning process, led by the attending physician.  Recommendations may be updated based on patient status, additional functional criteria and insurance authorization.  Follow Up Recommendations  Home health PT     Assistance Recommended at Discharge Intermittent Supervision/Assistance  Patient can return home with the following A little help with walking and/or transfers;A little help with bathing/dressing/bathroom;Assistance with cooking/housework;Assist for transportation;Help with stairs or ramp for entrance   Equipment Recommendations  Rolling walker (2 wheels);BSC/3in1    Recommendations for Other Services       Precautions / Restrictions Precautions Precautions: Fall Restrictions Weight Bearing Restrictions: No RLE Weight Bearing:  (No active WB precautions are located in the chart for this patient.)     Mobility  Bed Mobility Overal bed mobility: Needs Assistance Bed Mobility: Supine to Sit, Sit to Supine     Supine to sit: HOB elevated,  Supervision Sit to supine: HOB elevated, Supervision        Transfers Overall transfer level: Needs assistance Equipment used: None, Rolling walker (2 wheels) Transfers: Sit to/from Stand Sit to Stand: Min guard                Ambulation/Gait Ambulation/Gait assistance: Min guard, Min assist Gait Distance (Feet): 120 Feet Assistive device: Rolling walker (2 wheels) Gait Pattern/deviations: Narrow base of support, Decreased step length - right, Decreased step length - left       General Gait Details: Pt demonstrating step pattern that resembles festinating gait.   Stairs             Wheelchair Mobility    Modified Rankin (Stroke Patients Only)       Balance Overall balance assessment: Needs assistance Sitting-balance support: Feet supported, No upper extremity supported Sitting balance-Leahy Scale: Good     Standing balance support: Bilateral upper extremity supported, During functional activity, Reliant on assistive device for balance Standing balance-Leahy Scale: Fair                              Cognition Arousal/Alertness: Awake/alert Behavior During Therapy: WFL for tasks assessed/performed Overall Cognitive Status: Within Functional Limits for tasks assessed                                          Exercises      General Comments        Pertinent Vitals/Pain Pain  Assessment Pain Assessment: No/denies pain    Home Living                          Prior Function            PT Goals (current goals can now be found in the care plan section) Acute Rehab PT Goals Patient Stated Goal: to improve LE pain PT Goal Formulation: With patient Time For Goal Achievement: 10/06/21 Potential to Achieve Goals: Fair Progress towards PT goals: Progressing toward goals    Frequency    Min 2X/week      PT Plan Current plan remains appropriate    Co-evaluation              AM-PAC PT "6  Clicks" Mobility   Outcome Measure  Help needed turning from your back to your side while in a flat bed without using bedrails?: None Help needed moving from lying on your back to sitting on the side of a flat bed without using bedrails?: A Little Help needed moving to and from a bed to a chair (including a wheelchair)?: A Little Help needed standing up from a chair using your arms (e.g., wheelchair or bedside chair)?: A Little Help needed to walk in hospital room?: A Little Help needed climbing 3-5 steps with a railing? : A Little 6 Click Score: 19    End of Session   Activity Tolerance: Patient tolerated treatment well (Pt self limiting gait distances this session d/t fear of R knee buckling.) Patient left: in bed;with bed alarm set;with call bell/phone within reach Nurse Communication: Mobility status PT Visit Diagnosis: Other abnormalities of gait and mobility (R26.89);Muscle weakness (generalized) (M62.81);History of falling (Z91.81);Pain Pain - Right/Left: Right Pain - part of body: Leg     Time: 9509-3267 PT Time Calculation (min) (ACUTE ONLY): 15 min  Charges:  $Gait Training: 8-22 mins                     11:29 AM, 09/23/21 Sakari Alkhatib A. Saverio Danker PT, DPT Physical Therapist - [facility]    Mylene Bow A Alaina Donati 09/23/2021, 11:28 AM

## 2021-09-23 NOTE — Progress Notes (Signed)
PROGRESS NOTE    Gregory Lane  ZMO:[id number] DOB: 09-Sep-1950 DOA: 09/21/2021 PCP: Cletis Athens, MD   Brief Narrative:  This 71 years old male with PMH significant for chronic kidney disease, ongoing tobacco abuse, CVA, atrial flutter on Eliquis, dyslipidemia, COPD, osteoarthritis who presented in the ED with acute onset of left leg pain that started 4 hours before he arrived to the ED.  Patient has taken ibuprofen without any improvement.  Imaging in the ED:  lower extremity venous duplex negative for DVT.  Patient has Abnormal  ABI and was noted to have diminished capillary refill.  Vascular surgery was consulted. Patient underwent abdominal aortogram, mechanical thrombectomy left SFA, left profunda femoris, and distal left common femoral artery.  Tolerated well.  Patient continued on heparin and started on Coumadin.  Assessment & Plan:   Principal Problem:   PAD (peripheral artery disease) (HCC) Active Problems:   Essential hypertension   GERD without esophagitis   Dyslipidemia   Depression   Atrial flutter (HCC)   Chronic obstructive pulmonary disease (COPD) (HCC)   Coronary artery disease  Peripheral arterial disease: Patient presented with significant left lower extremity ischemia with ABI of 0.4 Vascular surgery was consulted.   Patient started on Pletal. Eliquis was switched with IV heparin.. Patient underwent abdominal aortogram, mechanical thrombectomy left SFA, left profunda femoris and distal common femoral artery.  Postoperative day 2. Follow-up vascular surgery recommendations.  Follow-up in 3 weeks for noninvasive studies. Adequate pain control with pain medications  Coronary artery disease: Continue Imdur.  Hold Lopressor due to bradycardia.  COPD: Continue home inhalers, not in any acute exacerbation.   Atrial fibrillation/flutter Eliquis was switched with IV heparin. Hold metoprolol, Cardizem due to bradycardia. Patient could not afford  Xarelto. Anticoagulation changed with Coumadin, started yesterday. Continue heparin until INR becomes therapeutic. Continue Coumadin as per pharmacy.  Major depression: Continue Wellbutrin XL and Prozac    Dyslipidemia: Continue Lipitor 20 mg daily  GERD without esophagitis Continue Protonix.   Essential hypertension: Hold metoprolol 25 mg twice daily Hold Cardizem 120 mg twice daily Continue amlodipine 10 mg daily Lisinopril 40 mg daily.  Bradycardia: Heart rate runs in 40s to 50s. Hold metoprolol and Cardizem for now.   DVT prophylaxis: Coumadin/IV heparin Code Status: Full code Family Communication: (No family at bedside) Disposition Plan:  Status is: Inpatient Remains inpatient appropriate because:  Patient admitted with acute lower extremity ischemia underwent angiogram and thrombectomy.  Tolerated well Patient started on Coumadin for anticoagulation.  Discharge home with home health services once INR becomes therapeutic.  Anticipated discharge back to SNF in 1 to 2 days.     Consultants:  Cardiology Vascular surgery  Procedures: Abdominal aortogram, mechanical thrombectomy left lower extremity Antimicrobials:  Anti-infectives (From admission, onward)    Start     Dose/Rate Route Frequency Ordered Stop   09/21/21 0810  ceFAZolin (ANCEF) IVPB 1 g/50 mL premix        over 30 Minutes  Continuous PRN 09/21/21 0815 09/21/21 1323   09/21/21 0730  ceFAZolin (ANCEF) IVPB 2g/100 mL premix        2 g 200 mL/hr over 30 Minutes Intravenous On call to O.R. 09/21/21 0715 09/21/21 0840       Subjective: Patient was seen and examined at bedside.  Overnight events noted.   Patient reports feeling better,  he states his left leg is feeling better but complains of pain in the right leg.  Denies any trauma. Patient is started on Coumadin  and remains on heparin until INR becomes therapeutic.   Objective: Vitals:   09/23/21 0738 09/23/21 1119 09/23/21 1122 09/23/21 1145   BP: (!) 153/60   (!) 116/46  Pulse: (!) 51   (!) 48  Resp: 18   19  Temp: 97.7 F (36.5 C)   97.8 F (36.6 C)  TempSrc: Oral     SpO2: 95% 91% 93% 96%  Weight:      Height:        Intake/Output Summary (Last 24 hours) at 09/23/2021 1323 Last data filed at 09/23/2021 1147 Gross per 24 hour  Intake 733.16 ml  Output 625 ml  Net 108.16 ml    Filed Weights   09/20/21 2330  Weight: 108 kg    Examination:  General exam: Appears comfortable, not in any acute distress.  Deconditioned Respiratory system: CTA bilaterally, no wheezing, no crackles, normal respiratory effort. Cardiovascular system: S1 & S2 heard, Irregular rhythm, no murmur. Gastrointestinal system: Abdomen is soft, non tender, non distended, BS+ Central nervous system: Alert and oriented x3. No focal neurological deficits. Extremities: Reports right lower extremity pain, s/p aortogram and thrombectomy. Skin: No rashes, lesions or ulcers Psychiatry: Judgement and insight appear normal. Mood & affect appropriate.     Data Reviewed: I have personally reviewed following labs and imaging studies  CBC: Recent Labs  Lab 09/21/21 0254 09/21/21 1745 09/22/21 0205 09/23/21 0436  WBC 10.6* 8.1 8.4 7.8  NEUTROABS 9.3*  --   --   --   HGB 15.5 13.4 12.3* 12.1*  HCT 46.0 39.8 36.3* 35.7*  MCV 102.9* 102.1* 102.8* 101.7*  PLT 199 171 162 142*    Basic Metabolic Panel: Recent Labs  Lab 09/21/21 0254 09/21/21 1745 09/23/21 0436  NA 138 139 137  K 4.7 4.3 4.0  CL 110 113* 114*  CO2 20* 22 20*  GLUCOSE 117* 138* 126*  BUN 30* 22 19  CREATININE 1.21 0.85 0.88  CALCIUM 8.8* 8.3* 8.4*  MG  --   --  1.9  PHOS  --   --  3.6    GFR: Estimated Creatinine Clearance: 96.3 mL/min (by C-G formula based on SCr of 0.88 mg/dL). Liver Function Tests: No results for input(s): "AST", "ALT", "ALKPHOS", "BILITOT", "PROT", "ALBUMIN" in the last 168 hours. No results for input(s): "LIPASE", "AMYLASE" in the last 168  hours. No results for input(s): "AMMONIA" in the last 168 hours. Coagulation Profile: Recent Labs  Lab 09/21/21 0254 09/23/21 0436  INR 1.1 1.1    Cardiac Enzymes: No results for input(s): "CKTOTAL", "CKMB", "CKMBINDEX", "TROPONINI" in the last 168 hours. BNP (last 3 results) No results for input(s): "PROBNP" in the last 8760 hours. HbA1C: No results for input(s): "HGBA1C" in the last 72 hours. CBG: No results for input(s): "GLUCAP" in the last 168 hours. Lipid Profile: No results for input(s): "CHOL", "HDL", "LDLCALC", "TRIG", "CHOLHDL", "LDLDIRECT" in the last 72 hours. Thyroid Function Tests: No results for input(s): "TSH", "T4TOTAL", "FREET4", "T3FREE", "THYROIDAB" in the last 72 hours. Anemia Panel: No results for input(s): "VITAMINB12", "FOLATE", "FERRITIN", "TIBC", "IRON", "RETICCTPCT" in the last 72 hours. Sepsis Labs: No results for input(s): "PROCALCITON", "LATICACIDVEN" in the last 168 hours.  No results found for this or any previous visit (from the past 240 hour(s)).   Radiology Studies: ECHOCARDIOGRAM COMPLETE  Result Date: 09/21/2021    ECHOCARDIOGRAM REPORT   Patient Name:   Gregory Lane Date of Exam: 09/21/2021 Medical Rec #:  [id number]  Height:       73.0 in Accession #:    [id number]       Weight:       238.1 lb Date of Birth:  Dec 21, 1950        BSA:          2.317 m Patient Age:    20 years         BP:           144/61 mmHg Patient Gender: M                HR:           52 bpm. Exam Location:  [facility] Procedure: 2D Echo, Color Doppler and Cardiac Doppler Indications:     I48.92 Atrial Flutter  History:         Patient has prior history of Echocardiogram examinations, most                  recent 02/17/2017. Previous Myocardial Infarction, Stroke; Risk                  Factors:Hypertension and Current Smoker.  Sonographer:     Charmayne Sheer Referring Phys:  [id number] Rupert TANG Diagnosing Phys: Yolonda Kida MD  Sonographer Comments: Technically difficult  study due to poor echo windows and suboptimal parasternal window. IMPRESSIONS  1. Left ventricular ejection fraction, by estimation, is 55 to 60%. The left ventricle has normal function. The left ventricle has no regional wall motion abnormalities. The left ventricular internal cavity size was moderately dilated. There is mild left ventricular hypertrophy. Left ventricular diastolic parameters were normal.  2. Right ventricular systolic function is normal. The right ventricular size is normal.  3. Left atrial size was mild to moderately dilated.  4. The mitral valve is normal in structure. Trivial mitral valve regurgitation.  5. The aortic valve is grossly normal. Aortic valve regurgitation is not visualized. FINDINGS  Left Ventricle: Left ventricular ejection fraction, by estimation, is 55 to 60%. The left ventricle has normal function. The left ventricle has no regional wall motion abnormalities. The left ventricular internal cavity size was moderately dilated. There is mild left ventricular hypertrophy. Left ventricular diastolic parameters were normal. Right Ventricle: The right ventricular size is normal. No increase in right ventricular wall thickness. Right ventricular systolic function is normal. Left Atrium: Left atrial size was mild to moderately dilated. Right Atrium: Right atrial size was normal in size. Pericardium: There is no evidence of pericardial effusion. Mitral Valve: The mitral valve is normal in structure. Trivial mitral valve regurgitation. Tricuspid Valve: The tricuspid valve is normal in structure. Tricuspid valve regurgitation is not demonstrated. Aortic Valve: The aortic valve is grossly normal. Aortic valve regurgitation is not visualized. Aortic valve mean gradient measures 6.0 mmHg. Aortic valve peak gradient measures 12.4 mmHg. Aortic valve area, by VTI measures 3.58 cm. Pulmonic Valve: The pulmonic valve was normal in structure. Pulmonic valve regurgitation is not visualized. Aorta:  The ascending aorta was not well visualized. IAS/Shunts: No atrial level shunt detected by color flow Doppler.  LEFT VENTRICLE PLAX 2D LVIDd:         5.63 cm      Diastology LVIDs:         3.80 cm      LV e' medial:    8.92 cm/s LV PW:         1.40 cm      LV E/e' medial:  9.9 LV IVS:  0.95 cm      LV e' lateral:   12.90 cm/s LVOT diam:     2.40 cm      LV E/e' lateral: 6.8 LV SV:         112 LV SV Index:   48 LVOT Area:     4.52 cm  LV Volumes (MOD) LV vol d, MOD A4C: 151.0 ml LV vol s, MOD A4C: 78.7 ml LV SV MOD A4C:     151.0 ml RIGHT VENTRICLE RV Basal diam:  3.83 cm LEFT ATRIUM             Index        RIGHT ATRIUM           Index LA diam:        4.90 cm 2.11 cm/m   RA Area:     15.10 cm LA Vol (A2C):   52.2 ml 22.53 ml/m  RA Volume:   35.00 ml  15.10 ml/m LA Vol (A4C):   60.2 ml 25.98 ml/m LA Biplane Vol: 56.5 ml 24.38 ml/m  AORTIC VALVE                     PULMONIC VALVE AV Area (Vmax):    3.47 cm      PV Vmax:       1.00 m/s AV Area (Vmean):   3.52 cm      PV Peak grad:  4.0 mmHg AV Area (VTI):     3.58 cm AV Vmax:           176.00 cm/s AV Vmean:          110.000 cm/s AV VTI:            0.313 m AV Peak Grad:      12.4 mmHg AV Mean Grad:      6.0 mmHg LVOT Vmax:         135.00 cm/s LVOT Vmean:        85.500 cm/s LVOT VTI:          0.248 m LVOT/AV VTI ratio: 0.79  AORTA Ao Root diam: 3.10 cm MITRAL VALVE MV Area (PHT): 3.36 cm    SHUNTS MV Decel Time: 226 msec    Systemic VTI:  0.25 m MV E velocity: 88.30 cm/s  Systemic Diam: 2.40 cm MV A velocity: 65.60 cm/s MV E/A ratio:  1.35 Dwayne D Callwood MD Electronically signed by Yolonda Kida MD Signature Date/Time: 09/21/2021/6:34:13 PM    Final     Scheduled Meds:  amLODipine  10 mg Oral Q1400   atorvastatin  20 mg Oral Daily   buPROPion  300 mg Oral Daily   cilostazol  100 mg Oral BID   FLUoxetine  40 mg Oral Daily   isosorbide mononitrate  60 mg Oral Daily   lisinopril  40 mg Oral Daily   mometasone-formoterol  2 puff Inhalation BID    nicotine  7 mg Transdermal Daily   pantoprazole  40 mg Oral Daily   potassium chloride SA  20 mEq Oral BID   sodium chloride flush  3 mL Intravenous Q12H   warfarin  7.5 mg Oral ONCE-1600   Warfarin - Pharmacist Dosing Inpatient   Does not apply q1600   Continuous Infusions:  sodium chloride Stopped (09/22/21 1700)   sodium chloride     heparin 1,800 Units/hr (09/23/21 0600)     LOS: 2 days    Time spent: 35 mins  Shawna Clamp, MD [facility]   If 7PM-7AM, please contact night-coverage

## 2021-09-23 NOTE — TOC Initial Note (Signed)
Transition of Care [facility]) - Initial/Assessment Note    Patient Details  Name: Gregory Lane MRN: [id number] Date of Birth: 16-Dec-1950  [facility] CM/SW Contact:    Alberteen Sam, LCSW Phone Number: 09/23/2021, 9:22 AM  Clinical Narrative:                  CSW spoke with patient regarding HH PT recs, patient in agreement with no preference of agency. Reports he already has a rolling walker at home, does not need a 3in1. Continues to see Dr. Lavera Guise as PCP.  CSW has sent [facility] PT referral to Meg with [facility].   Expected Discharge Plan: [place] Barriers to Discharge: Continued Medical Work up   Patient Goals and CMS Choice Patient states their goals for this hospitalization and ongoing recovery are:: to go home CMS Medicare.gov Compare Post Acute Care list provided to:: Patient Choice offered to / list presented to : Patient  Expected Discharge Plan and Services Expected Discharge Plan: [place]       Living arrangements for the past 2 months: Single Family Home                           HH Arranged: PT HH Agency: [facility] Date [place]: 09/23/21 Time [place]: [phone] Representative spoke with at [place]: [place] Arrangements/Services Living arrangements for the past 2 months: [place] with:: Self                   Activities of Daily Living Home Assistive Devices/Equipment: Environmental consultant (specify type), Cane (specify quad or straight) ADL Screening (condition at time of admission) Patient's cognitive ability adequate to safely complete daily activities?: Yes Is the patient deaf or have difficulty hearing?: No Does the patient have difficulty seeing, even when wearing glasses/contacts?: No Does the patient have difficulty concentrating, remembering, or making decisions?: No Patient able to express need for assistance with ADLs?: Yes Does the patient have  difficulty dressing or bathing?: No Independently performs ADLs?: Yes (appropriate for developmental age) Does the patient have difficulty walking or climbing stairs?: No Weakness of Legs: None Weakness of Arms/Hands: None  Permission Sought/Granted                  Emotional Assessment       Orientation: : Oriented to Self, Oriented to Place, Oriented to  Time, Oriented to Situation Alcohol / Substance Use: Not Applicable Psych Involvement: No (comment)  Admission diagnosis:  Left leg pain [M79.605] PAD (peripheral artery disease) ([place]) [I73.9] Critical limb ischemia of left lower extremity ([place]) [I70.222] Patient Active Problem List   Diagnosis Date Noted   PAD (peripheral artery disease) ([place]) 09/21/2021   GERD without esophagitis 09/21/2021   Dyslipidemia 09/21/2021   Depression 09/21/2021   Atrial flutter ([place]) 09/21/2021   Chronic obstructive pulmonary disease (COPD) ([place]) 09/21/2021   Coronary artery disease 09/21/2021   Cerebrovascular accident (CVA) due to embolism of cerebral artery ([place]) 09/06/2021   Vasovagal syncope 08/02/2020   Annual physical exam 12/26/2019   Hyperlipidemia 12/26/2019   Abrasion of right hand 27/51/7001   Metabolic syndrome 74/94/4967   Low TSH level 02/17/2017   Atrial flutter with rapid ventricular response ([place]) 02/16/2017   Cigarette smoker 02/16/2017   COPD with acute exacerbation ([place]) 02/16/2017   Elevated lactic acid level 02/16/2017   Aortic atherosclerosis (  [place]) 02/16/2017   Productive cough    Bilateral edema of lower extremity    Hypoxia 05/24/2015   SOB (shortness of breath) 05/24/2015   COPD suggested by initial evaluation ([place]) 05/17/2015   Essential hypertension 05/17/2015   Smoking 05/17/2015   PCP:  Cletis Athens, MD Pharmacy:   [facility] ([place], [address] [facility] [address] [place] [address] Phone: [phone] Fax:  [phone]  [id number] - Closed - [place], [address] [place] [address] Phone: [phone] Fax: [phone]  [place], [address] [place] [address] Phone: [phone] Fax: [phone]  [id number] - [place], [place] - [address] [place] [address] Phone: [phone] Fax: [phone]     Social Determinants of Health (SDOH) Interventions    Readmission Risk Interventions     No data to display

## 2021-09-23 NOTE — Progress Notes (Signed)
[facility]    SUBJECTIVE: Patient states to be doing better denies any chest pain denies any practice positive syncope here for routine follow   Vitals:   09/23/21 1119 09/23/21 1122 09/23/21 1145 09/23/21 1538  BP:   (!) 116/46 (!) 112/50  Pulse:   (!) 48 (!) 50  Resp:   19 18  Temp:   97.8 F (36.6 C) 98.4 F (36.9 C)  TempSrc:      SpO2: 91% 93% 96% 97%  Weight:      Height:         Intake/Output Summary (Last 24 hours) at 09/23/2021 1846 Last data filed at 09/23/2021 1540 Gross per 24 hour  Intake 973.16 ml  Output 825 ml  Net 148.16 ml      PHYSICAL EXAM  General: Well developed, well nourished, in no acute distress HEENT:  Normocephalic and atramatic Neck:  No JVD.  Lungs: Clear bilaterally to auscultation and percussion. Heart: HRRR . Normal S1 and S2 without gallops or murmurs.  Abdomen: Bowel sounds are positive, abdomen soft and non-tender  Msk:  Back normal, normal gait. Normal strength and tone for age. Extremities: No clubbing, cyanosis or edema.   Neuro: Alert and oriented X 3. Psych:  Good affect, responds appropriately   LABS: Basic Metabolic Panel: Recent Labs    09/21/21 1745 09/23/21 0436  NA 139 137  K 4.3 4.0  CL 113* 114*  CO2 22 20*  GLUCOSE 138* 126*  BUN 22 19  CREATININE 0.85 0.88  CALCIUM 8.3* 8.4*  MG  --  1.9  PHOS  --  3.6   Liver Function Tests: No results for input(s): "AST", "ALT", "ALKPHOS", "BILITOT", "PROT", "ALBUMIN" in the last 72 hours. No results for input(s): "LIPASE", "AMYLASE" in the last 72 hours. CBC: Recent Labs    09/21/21 0254 09/21/21 1745 09/22/21 0205 09/23/21 0436  WBC 10.6*   < > 8.4 7.8  NEUTROABS 9.3*  --   --   --   HGB 15.5   < > 12.3* 12.1*  HCT 46.0   < > 36.3* 35.7*  MCV 102.9*   < > 102.8* 101.7*  PLT 199   < > 162 142*   < > = values in this interval not displayed.   Cardiac Enzymes: No results for input(s): "CKTOTAL", "CKMB", "CKMBINDEX", "TROPONINI" in the last 72  hours. BNP: Invalid input(s): "POCBNP" D-Dimer: No results for input(s): "DDIMER" in the last 72 hours. Hemoglobin A1C: No results for input(s): "HGBA1C" in the last 72 hours. Fasting Lipid Panel: No results for input(s): "CHOL", "HDL", "LDLCALC", "TRIG", "CHOLHDL", "LDLDIRECT" in the last 72 hours. Thyroid Function Tests: No results for input(s): "TSH", "T4TOTAL", "T3FREE", "THYROIDAB" in the last 72 hours.  Invalid input(s): "FREET3" Anemia Panel: No results for input(s): "VITAMINB12", "FOLATE", "FERRITIN", "TIBC", "IRON", "RETICCTPCT" in the last 72 hours.  No results found.   Echo preserved left ventricular function EF 55 to 60%  TELEMETRY: Telemetry independently evaluated by me suggest: Atrial fibrillation rate controlled 75 nonspecific ST-T wave changes  ASSESSMENT AND PLAN:  Principal Problem:   PAD (peripheral artery disease) (HCC) Active Problems:   Essential hypertension   GERD without esophagitis   Dyslipidemia   Depression   Atrial flutter (HCC)   Chronic obstructive pulmonary disease (COPD) (HCC)   Coronary artery disease    Plan Continue current therapy management for peripheral artery disease as per vascular Anticoagulation with Coumadin for embolic source Atrial flutter rate controlled anticoagulation with Coumadin Agree  with Lipitor therapy for lipid management Maintain amlodipine Imdur lisinopril for blood pressure control Coumadin dose PT/INR as per pharmacy Agree with Protonix therapy for reflux type symptoms Increase activity ambulation physical therapy in the halls    Yolonda Kida, MD 09/23/2021 6:46 PM

## 2021-09-23 NOTE — Care Management Important Message (Signed)
Important Message  Patient Details  Name: Gregory Lane MRN: [id number] Date of Birth: January 01, 1951   Medicare Important Message Given:  N/A - LOS <3 / Initial given by admissions     Juliann Pulse [name] 09/23/2021, 10:57 AM

## 2021-09-23 NOTE — Progress Notes (Addendum)
[facility]  Daily Progress Note   Subjective  -   Patient notes left lower extremity feels much better, endorses some weakness in right knee  Objective Vitals:   09/22/21 1556 09/22/21 1950 09/22/21 2104 09/22/21 2310  BP: (!) 93/36 137/69  (!) 137/56  Pulse: (!) 51 (!) 49  (!) 58  Resp: 16 18  20   Temp: 97.8 F (36.6 C) 97.6 F (36.4 C)  97.6 F (36.4 C)  TempSrc: Oral     SpO2: 97% 97% 95% 97%  Weight:      Height:        Intake/Output Summary (Last 24 hours) at 09/23/2021 0045 Last data filed at 09/23/2021 0000 Gross per 24 hour  Intake 873.12 ml  Output 675 ml  Net 198.12 ml    PULM  CTAB CV  RRR VASC  left foot warm right groin clean dry intact  Laboratory CBC    Component Value Date/Time   WBC 8.4 09/22/2021 0205   HGB 12.3 (L) 09/22/2021 0205   HCT 36.3 (L) 09/22/2021 0205   PLT 162 09/22/2021 0205    BMET    Component Value Date/Time   NA 139 09/21/2021 1745   K 4.3 09/21/2021 1745   CL 113 (H) 09/21/2021 1745   CO2 22 09/21/2021 1745   GLUCOSE 138 (H) 09/21/2021 1745   BUN 22 09/21/2021 1745   CREATININE 0.85 09/21/2021 1745   CREATININE 0.91 12/19/2019 1114   CALCIUM 8.3 (L) 09/21/2021 1745   GFRNONAA >60 09/21/2021 1745   GFRNONAA 84 12/19/2019 1114   GFRAA 98 12/19/2019 1114    Assessment/Planning: POD #1 s/p peripheral thrombectomy  Patient notes left lower extremity feeling much better Notes some weakness in the right leg in the knee area.  We will have patient follow-up in office in 3 weeks for noninvasive studies.  At that time we can evaluate if there is a vascular component to his weakness. Patient will discharge with Coumadin to be followed by cardiology.  Kris Hartmann  09/23/2021, 12:45 AM

## 2021-09-24 DIAGNOSIS — I4892 Unspecified atrial flutter: Secondary | ICD-10-CM | POA: Diagnosis not present

## 2021-09-24 DIAGNOSIS — I739 Peripheral vascular disease, unspecified: Secondary | ICD-10-CM | POA: Diagnosis not present

## 2021-09-24 LAB — BASIC METABOLIC PANEL
Anion gap: 6 (ref 5–15)
BUN: 17 mg/dL (ref 8–23)
CO2: 21 mmol/L — ABNORMAL LOW (ref 22–32)
Calcium: 8.6 mg/dL — ABNORMAL LOW (ref 8.9–10.3)
Chloride: 110 mmol/L (ref 98–111)
Creatinine, Ser: 0.96 mg/dL (ref 0.61–1.24)
GFR, Estimated: >60 mL/min (ref >60)
Glucose, Bld: 119 mg/dL — ABNORMAL HIGH (ref 70–99)
Potassium: 4 mmol/L (ref 3.5–5.1)
Sodium: 137 mmol/L (ref 135–145)

## 2021-09-24 LAB — PROTIME-INR
INR: 1.9 — ABNORMAL HIGH (ref 0.8–1.2)
Prothrombin Time: 21.8 seconds — ABNORMAL HIGH (ref 11.4–15.2)

## 2021-09-24 LAB — CBC
HCT: 33.3 % — ABNORMAL LOW (ref 39.0–52.0)
Hemoglobin: 11.5 g/dL — ABNORMAL LOW (ref 13.0–17.0)
MCH: 34.8 pg — ABNORMAL HIGH (ref 26.0–34.0)
MCHC: 34.5 g/dL (ref 30.0–36.0)
MCV: 100.9 fL — ABNORMAL HIGH (ref 80.0–100.0)
Platelets: 153 10*3/uL (ref 150–400)
RBC: 3.3 MIL/uL — ABNORMAL LOW (ref 4.22–5.81)
RDW: 12.2 % (ref 11.5–15.5)
WBC: 6.4 10*3/uL (ref 4.0–10.5)
nRBC: 0 % (ref 0.0–0.2)

## 2021-09-24 LAB — PHOSPHORUS: Phosphorus: 3.1 mg/dL (ref 2.5–4.6)

## 2021-09-24 LAB — MAGNESIUM: Magnesium: 2 mg/dL (ref 1.7–2.4)

## 2021-09-24 LAB — HEPARIN LEVEL (UNFRACTIONATED): Heparin Unfractionated: 0.58 IU/mL (ref 0.30–0.70)

## 2021-09-24 MED ORDER — WARFARIN SODIUM 5 MG PO TABS
5.0000 mg | ORAL_TABLET | Freq: Once | ORAL | Status: AC
  Administered 2021-09-24: 5 mg via ORAL
  Filled 2021-09-24: qty 1

## 2021-09-24 NOTE — Progress Notes (Signed)
PROGRESS NOTE    Gregory Lane  OQH:[id number] DOB: 1950/07/16 DOA: 09/21/2021 PCP: Cletis Athens, MD   Brief Narrative:  This 71 years old male with PMH significant for chronic kidney disease, ongoing tobacco abuse, CVA, atrial flutter on Eliquis, dyslipidemia, COPD, osteoarthritis who presented in the ED with acute onset of left leg pain that started 4 hours before he arrived to the ED.  Patient has taken ibuprofen without any improvement.  Imaging in the ED:  lower extremity venous duplex negative for DVT.  Patient has Abnormal  ABI and was noted to have diminished capillary refill.  Vascular surgery was consulted. Patient underwent abdominal aortogram, mechanical thrombectomy left SFA, left profunda femoris, and distal left common femoral artery.  Tolerated well.  Patient continued on heparin and started on Coumadin.  Assessment & Plan:   Principal Problem:   PAD (peripheral artery disease) (HCC) Active Problems:   Essential hypertension   GERD without esophagitis   Dyslipidemia   Depression   Atrial flutter (HCC)   Chronic obstructive pulmonary disease (COPD) (HCC)   Coronary artery disease  Peripheral arterial disease: Patient presented with significant left lower extremity ischemia with ABI of 0.4 Vascular surgery was consulted.   Patient started on Pletal. Eliquis was switched with IV heparin.. Patient underwent abdominal aortogram, mechanical thrombectomy left SFA, left profunda femoris and distal common femoral artery.  Postoperative day 2. Follow-up vascular surgery recommendations.  Follow-up in 3 weeks for noninvasive studies. Adequate pain control with pain medications  Coronary artery disease: Continue Imdur.  Hold Lopressor due to bradycardia.  COPD: Continue home inhalers, not in any acute exacerbation.   Atrial fibrillation/flutter Eliquis was switched with IV heparin. Hold metoprolol, Cardizem due to bradycardia. Patient could not afford  Xarelto. Anticoagulation changed with Coumadin, started yesterday. Continue heparin until INR becomes therapeutic. Continue Coumadin as per pharmacy.  Major depression: Continue Wellbutrin XL and Prozac    Dyslipidemia: Continue Lipitor 20 mg daily  GERD without esophagitis Continue Protonix.   Essential hypertension: Hold metoprolol 25 mg twice daily Hold Cardizem 120 mg twice daily Continue amlodipine 10 mg daily Lisinopril 40 mg daily.  Bradycardia: Heart rate runs in 40s to 50s. Hold metoprolol and Cardizem for now.   DVT prophylaxis: Coumadin/IV heparin Code Status: Full code Family Communication: (No family at bedside) Disposition Plan:  Status is: Inpatient Remains inpatient appropriate because:  Patient admitted with acute lower extremity ischemia underwent angiogram and thrombectomy.  Tolerated well , Patient started on Coumadin for anticoagulation.  Discharge home with home health services once INR becomes therapeutic.  Anticipated discharge back to SNF in 1 to 2 days.     Consultants:  Cardiology Vascular surgery  Procedures: Abdominal aortogram, mechanical thrombectomy left lower extremity Antimicrobials:  Anti-infectives (From admission, onward)    Start     Dose/Rate Route Frequency Ordered Stop   09/21/21 0810  ceFAZolin (ANCEF) IVPB 1 g/50 mL premix        over 30 Minutes  Continuous PRN 09/21/21 0815 09/21/21 1323   09/21/21 0730  ceFAZolin (ANCEF) IVPB 2g/100 mL premix        2 g 200 mL/hr over 30 Minutes Intravenous On call to O.R. 09/21/21 0715 09/21/21 0840       Subjective: Patient was seen and examined at bedside.  Overnight events noted.   Patient reports feeling better. He states his left leg is better, still complains of pain in the right leg.  Denies any trauma. He reports feeling nauseous, does not  want to be discharged today. .   Objective: Vitals:   09/24/21 0003 09/24/21 0345 09/24/21 0813 09/24/21 1233  BP: (!) 104/40 (!)  121/43 (!) 156/56 (!) 156/52  Pulse: (!) 56 (!) 54 61 62  Resp: 18 18    Temp: 97.9 F (36.6 C) 98.2 F (36.8 C) 97.8 F (36.6 C) 97.8 F (36.6 C)  TempSrc:   Oral Oral  SpO2: 94% 95% 95% 97%  Weight:      Height:        Intake/Output Summary (Last 24 hours) at 09/24/2021 1347 Last data filed at 09/24/2021 0823 Gross per 24 hour  Intake 671.33 ml  Output 1115 ml  Net -443.67 ml   Filed Weights   09/20/21 2330  Weight: 108 kg    Examination:  General exam: Appears comfortable, not in any acute distress.  Deconditioned Respiratory system: CTA bilaterally, no wheezing, no crackles, normal respiratory effort. Cardiovascular system: S1-S2 heard, regular rhythm, no murmur. Gastrointestinal system: Abdomen is soft, non tender, non distended, BS+ Central nervous system: Alert and oriented x3. No focal neurological deficits. Extremities: No edema, no cyanosis, no clubbing.  S/p aortogram and thrombectomy. Skin: No rashes, lesions or ulcers Psychiatry: Judgement and insight appear normal. Mood & affect appropriate.     Data Reviewed: I have personally reviewed following labs and imaging studies  CBC: Recent Labs  Lab 09/21/21 0254 09/21/21 1745 09/22/21 0205 09/23/21 0436 09/24/21 0438  WBC 10.6* 8.1 8.4 7.8 6.4  NEUTROABS 9.3*  --   --   --   --   HGB 15.5 13.4 12.3* 12.1* 11.5*  HCT 46.0 39.8 36.3* 35.7* 33.3*  MCV 102.9* 102.1* 102.8* 101.7* 100.9*  PLT 199 171 162 142* 093   Basic Metabolic Panel: Recent Labs  Lab 09/21/21 0254 09/21/21 1745 09/23/21 0436 09/24/21 0438  NA 138 139 137 137  K 4.7 4.3 4.0 4.0  CL 110 113* 114* 110  CO2 20* 22 20* 21*  GLUCOSE 117* 138* 126* 119*  BUN 30* 22 19 17   CREATININE 1.21 0.85 0.88 0.96  CALCIUM 8.8* 8.3* 8.4* 8.6*  MG  --   --  1.9 2.0  PHOS  --   --  3.6 3.1   GFR: Estimated Creatinine Clearance: 88.3 mL/min (by C-G formula based on SCr of 0.96 mg/dL). Liver Function Tests: No results for input(s): "AST",  "ALT", "ALKPHOS", "BILITOT", "PROT", "ALBUMIN" in the last 168 hours. No results for input(s): "LIPASE", "AMYLASE" in the last 168 hours. No results for input(s): "AMMONIA" in the last 168 hours. Coagulation Profile: Recent Labs  Lab 09/21/21 0254 09/23/21 0436 09/24/21 0438  INR 1.1 1.1 1.9*   Cardiac Enzymes: No results for input(s): "CKTOTAL", "CKMB", "CKMBINDEX", "TROPONINI" in the last 168 hours. BNP (last 3 results) No results for input(s): "PROBNP" in the last 8760 hours. HbA1C: No results for input(s): "HGBA1C" in the last 72 hours. CBG: No results for input(s): "GLUCAP" in the last 168 hours. Lipid Profile: No results for input(s): "CHOL", "HDL", "LDLCALC", "TRIG", "CHOLHDL", "LDLDIRECT" in the last 72 hours. Thyroid Function Tests: No results for input(s): "TSH", "T4TOTAL", "FREET4", "T3FREE", "THYROIDAB" in the last 72 hours. Anemia Panel: No results for input(s): "VITAMINB12", "FOLATE", "FERRITIN", "TIBC", "IRON", "RETICCTPCT" in the last 72 hours. Sepsis Labs: No results for input(s): "PROCALCITON", "LATICACIDVEN" in the last 168 hours.  No results found for this or any previous visit (from the past 240 hour(s)).   Radiology Studies: No results found.  Scheduled Meds:  amLODipine  10 mg Oral Q1400   atorvastatin  20 mg Oral Daily   buPROPion  300 mg Oral Daily   cilostazol  100 mg Oral BID   FLUoxetine  40 mg Oral Daily   isosorbide mononitrate  60 mg Oral Daily   lisinopril  40 mg Oral Daily   mometasone-formoterol  2 puff Inhalation BID   nicotine  7 mg Transdermal Daily   pantoprazole  40 mg Oral Daily   potassium chloride SA  20 mEq Oral BID   sodium chloride flush  3 mL Intravenous Q12H   warfarin  5 mg Oral ONCE-1600   Warfarin - Pharmacist Dosing Inpatient   Does not apply q1600   Continuous Infusions:  sodium chloride Stopped (09/22/21 1700)   sodium chloride     heparin 1,800 Units/hr (09/24/21 0924)     LOS: 3 days    Time spent: 35  mins    Margurite Duffy, MD [facility]   If 7PM-7AM, please contact night-coverage

## 2021-09-24 NOTE — Progress Notes (Signed)
[place] for warfarin + heparin bridge infusion Indication: arterial embolism of the left leg  No Known Allergies  Patient Measurements: Height: 6\' 1"  (185.4 cm) Weight: 108 kg (238 lb 1.6 oz) IBW/kg (Calculated) : 79.9 Heparin Dosing Weight: 102.3 kg  Vital Signs: Temp: 97.8 F (36.6 C) (08/05 0813) Temp Source: Oral (08/05 0813) BP: 156/56 (08/05 0813) Pulse Rate: 61 (08/05 0813)  Labs: Recent Labs    09/21/21 1745 09/22/21 0205 09/23/21 0436 09/24/21 0438  HGB 13.4 12.3* 12.1* 11.5*  HCT 39.8 36.3* 35.7* 33.3*  PLT 171 162 142* 153  LABPROT  --   --  14.3 21.8*  INR  --   --  1.1 1.9*  HEPARINUNFRC 0.58 0.65 0.60 0.58  CREATININE 0.85  --  0.88 0.96     Estimated Creatinine Clearance: 88.3 mL/min (by C-G formula based on SCr of 0.96 mg/dL).   Medical History: Past Medical History:  Diagnosis Date   Aortic atherosclerosis ([place]) 02/16/2017   Arthritis    Blind left eye    Hypertension    Low TSH level 02/17/2017   Myocardial infarction ([place])    Perforated duodenal ulcer ([place])    Stroke (HCC)     Medications:  PTA: Prescribed Eliquis,  however pt states he never had rx filled b/c it was too expensive.  Inpatient: heparin infusion (8/2 > ), warfarin (8/2 > ) Allergies: NKDA  Assessment: 71 year old male admitted with left lower leg arterial embolism. Now s/p lower extremity angiography. Pharmacy consulted to re-start heparin.   Date Time HL Rate/Comment 8/2 1745 0.58 Therapeutic x 1 8/3 0205 0.65 Therapeutic x 2 8/4 0436 0.60  Therapeutic x 3 8/5 0438 0.58 Therapeutic    Date INR Dose  8/3 1.1  7.5mg   8/4 1.1 7.5mg   8/5 1.9          Goal of Therapy:  INR 2-3 Heparin level 0.3-0.7 units/ml Monitor platelets by anticoagulation protocol: Yes   Plan:  Order warfarin 5 mg x1 Continue heparin at 1800 units/hr. F/u Heparin level with am labs Continue to monitor H&H and platelets and CBC daily, INR  daily Continue heparin drip until INR consecutively therapeutic for at least 2 days.   Demarqus Jocson A Clinical Pharmacist 09/24/2021 9:01 AM

## 2021-09-24 NOTE — Progress Notes (Signed)
SUBJECTIVE: Feeling much better no chest pain   Vitals:   09/23/21 2007 09/24/21 0003 09/24/21 0345 09/24/21 0813  BP: (!) 127/52 (!) 104/40 (!) 121/43 (!) 156/56  Pulse: (!) 50 (!) 56 (!) 54 61  Resp: 16 18 18    Temp: 98.4 F (36.9 C) 97.9 F (36.6 C) 98.2 F (36.8 C) 97.8 F (36.6 C)  TempSrc:    Oral  SpO2: 95% 94% 95% 95%  Weight:      Height:        Intake/Output Summary (Last 24 hours) at 09/24/2021 1100 Last data filed at 09/24/2021 7867 Gross per 24 hour  Intake 671.33 ml  Output 1215 ml  Net -543.67 ml    LABS: Basic Metabolic Panel: Recent Labs    09/23/21 0436 09/24/21 0438  NA 137 137  K 4.0 4.0  CL 114* 110  CO2 20* 21*  GLUCOSE 126* 119*  BUN 19 17  CREATININE 0.88 0.96  CALCIUM 8.4* 8.6*  MG 1.9 2.0  PHOS 3.6 3.1   Liver Function Tests: No results for input(s): "AST", "ALT", "ALKPHOS", "BILITOT", "PROT", "ALBUMIN" in the last 72 hours. No results for input(s): "LIPASE", "AMYLASE" in the last 72 hours. CBC: Recent Labs    09/23/21 0436 09/24/21 0438  WBC 7.8 6.4  HGB 12.1* 11.5*  HCT 35.7* 33.3*  MCV 101.7* 100.9*  PLT 142* 153   Cardiac Enzymes: No results for input(s): "CKTOTAL", "CKMB", "CKMBINDEX", "TROPONINI" in the last 72 hours. BNP: Invalid input(s): "POCBNP" D-Dimer: No results for input(s): "DDIMER" in the last 72 hours. Hemoglobin A1C: No results for input(s): "HGBA1C" in the last 72 hours. Fasting Lipid Panel: No results for input(s): "CHOL", "HDL", "LDLCALC", "TRIG", "CHOLHDL", "LDLDIRECT" in the last 72 hours. Thyroid Function Tests: No results for input(s): "TSH", "T4TOTAL", "T3FREE", "THYROIDAB" in the last 72 hours.  Invalid input(s): "FREET3" Anemia Panel: No results for input(s): "VITAMINB12", "FOLATE", "FERRITIN", "TIBC", "IRON", "RETICCTPCT" in the last 72 hours.   PHYSICAL EXAM General: Well developed, well nourished, in no acute distress HEENT:  Normocephalic and atramatic Neck:  No JVD.  Lungs: Clear  bilaterally to auscultation and percussion. Heart: HRRR . Normal S1 and S2 without gallops or murmurs.  Abdomen: Bowel sounds are positive, abdomen soft and non-tender  Msk:  Back normal, normal gait. Normal strength and tone for age. Extremities: No clubbing, cyanosis or edema.   Neuro: Alert and oriented X 3. Psych:  Good affect, responds appropriately  TELEMETRY: Atrial flutter  ASSESSMENT AND PLAN: Atrial flutter and peripheral vascular disease and coronary artery disease.  Continue medical therapy and anticoagulation.  Advise ambulation in the hallway.  Principal Problem:   PAD (peripheral artery disease) (HCC) Active Problems:   Essential hypertension   GERD without esophagitis   Dyslipidemia   Depression   Atrial flutter (HCC)   Chronic obstructive pulmonary disease (COPD) ([place])   Coronary artery disease    Gregory Derrick A, MD, [facility] 09/24/2021 11:00 AM

## 2021-09-24 NOTE — Progress Notes (Signed)
[place] for IV heparin Indication: arterial embolism of the left leg  No Known Allergies  Patient Measurements: Height: 6\' 1"  (185.4 cm) Weight: 108 kg (238 lb 1.6 oz) IBW/kg (Calculated) : 79.9 Heparin Dosing Weight: 102.3 kg  Vital Signs: Temp: 98.2 F (36.8 C) (08/05 0345) BP: 121/43 (08/05 0345) Pulse Rate: 54 (08/05 0345)  Labs: Recent Labs    09/21/21 1745 09/22/21 0205 09/23/21 0436 09/24/21 0438  HGB 13.4 12.3* 12.1* 11.5*  HCT 39.8 36.3* 35.7* 33.3*  PLT 171 162 142* 153  LABPROT  --   --  14.3 21.8*  INR  --   --  1.1 1.9*  HEPARINUNFRC 0.58 0.65 0.60 0.58  CREATININE 0.85  --  0.88 0.96     Estimated Creatinine Clearance: 88.3 mL/min (by C-G formula based on SCr of 0.96 mg/dL).   Medical History: Past Medical History:  Diagnosis Date   Aortic atherosclerosis ([place]) 02/16/2017   Arthritis    Blind left eye    Hypertension    Low TSH level 02/17/2017   Myocardial infarction ([place])    Perforated duodenal ulcer ([place])    Stroke (HCC)     Medications:  PTA: Prescribed Eliquis,  however pt states he never had rx filled b/c it was too expensive.  Inpatient: heparin infusion (8/2 > ), warfarin (8/2 > ) Allergies: NKDA  Assessment: 71 year old male admitted with left lower leg arterial embolism. Now s/p lower extremity angiography. Pharmacy consulted to re-start heparin.   Date Time HL Rate/Comment 8/2 1745 0.58 Therapeutic x 1   Date INR Dose  8/2 1.1 7.5mg      Goal of Therapy:  INR 2-3 Heparin level 0.3-0.7 units/ml Monitor platelets by anticoagulation protocol: Yes   Plan:  8/5:  HL @ 0438 = 0.58, therapeutic X 4 Will continue pt on current rate and recheck HL on 8/6 with AM labs.   Bronwen Pendergraft D Clinical Pharmacist 09/24/2021 5:54 AM

## 2021-09-24 NOTE — TOC Progression Note (Signed)
Transition of Care [facility]) - Progression Note    Patient Details  Name: Gregory Lane MRN: [id number] Date of Birth: 06/20/1950  Transition of Care [facility]) CM/SW [place], LCSW Phone Number: 09/24/2021, 9:51 AM  Clinical Narrative: CSW acknowledges consult for medication assistance with Eliquis and nicotine gum/patches. Per chart review, Eliquis is not a new medication so he is not eligible for 30-day free coupon. Patient has insurance so please prescribe nicotine gum/patches at discharge if needed.  Expected Discharge Plan: [place] Barriers to Discharge: Continued Medical Work up  Expected Discharge Plan and Services Expected Discharge Plan: [place] arrangements for the past 2 months: Single Family Home                           HH Arranged: PT [place]: [place] Date [place]: 09/23/21 Time [place]: [phone] Representative spoke with at [place]: Meg   Social Determinants of Health ([place]) Interventions    Readmission Risk Interventions     No data to display

## 2021-09-25 DIAGNOSIS — I739 Peripheral vascular disease, unspecified: Secondary | ICD-10-CM | POA: Diagnosis not present

## 2021-09-25 LAB — CBC
HCT: 37.5 % — ABNORMAL LOW (ref 39.0–52.0)
Hemoglobin: 12.7 g/dL — ABNORMAL LOW (ref 13.0–17.0)
MCH: 34.3 pg — ABNORMAL HIGH (ref 26.0–34.0)
MCHC: 33.9 g/dL (ref 30.0–36.0)
MCV: 101.4 fL — ABNORMAL HIGH (ref 80.0–100.0)
Platelets: 168 10*3/uL (ref 150–400)
RBC: 3.7 MIL/uL — ABNORMAL LOW (ref 4.22–5.81)
RDW: 12.4 % (ref 11.5–15.5)
WBC: 7.1 10*3/uL (ref 4.0–10.5)
nRBC: 0 % (ref 0.0–0.2)

## 2021-09-25 LAB — PROTIME-INR
INR: 2.8 — ABNORMAL HIGH (ref 0.8–1.2)
Prothrombin Time: 29.2 seconds — ABNORMAL HIGH (ref 11.4–15.2)

## 2021-09-25 LAB — HEPARIN LEVEL (UNFRACTIONATED): Heparin Unfractionated: 0.5 IU/mL (ref 0.30–0.70)

## 2021-09-25 LAB — GLUCOSE, CAPILLARY: Glucose-Capillary: 98 mg/dL (ref 70–99)

## 2021-09-25 NOTE — Progress Notes (Signed)
[place] for warfarin + heparin bridge infusion Indication: arterial embolism of the left leg  No Known Allergies  Patient Measurements: Height: 6\' 1"  (185.4 cm) Weight: 108 kg (238 lb 1.6 oz) IBW/kg (Calculated) : 79.9 Heparin Dosing Weight: 102.3 kg  Vital Signs: Temp: 98 F (36.7 C) (08/06 0418) Temp Source: Oral (08/06 0418) BP: 148/58 (08/06 0418) Pulse Rate: 78 (08/06 0418)  Labs: Recent Labs    09/23/21 0436 09/24/21 0438 09/25/21 0620  HGB 12.1* 11.5* 12.7*  HCT 35.7* 33.3* 37.5*  PLT 142* 153 168  LABPROT 14.3 21.8* 29.2*  INR 1.1 1.9* 2.8*  HEPARINUNFRC 0.60 0.58 0.50  CREATININE 0.88 0.96  --      Estimated Creatinine Clearance: 88.3 mL/min (by C-G formula based on SCr of 0.96 mg/dL).   Medical History: Past Medical History:  Diagnosis Date   Aortic atherosclerosis ([place]) 02/16/2017   Arthritis    Blind left eye    Hypertension    Low TSH level 02/17/2017   Myocardial infarction ([place])    Perforated duodenal ulcer ([place])    Stroke (HCC)     Medications:  PTA: Prescribed Eliquis,  however pt states he never had rx filled b/c it was too expensive.  Inpatient: heparin infusion (8/2 > ), warfarin (8/2 > ) Allergies: NKDA.   Assessment: 71 year old male admitted with left lower leg arterial embolism. Now s/p lower extremity angiography. Pharmacy consulted to re-start heparin.s/p mechanical thrombectomy on 8/2.    Date Time HL Rate/Comment 8/2 1745 0.58 Therapeutic x 1 8/3 0205 0.65 Therapeutic x 2 8/4 0436 0.60  Therapeutic x 3 8/5 0438 0.58 Therapeutic  8/6 0620 0.5 Therapeutic   Date INR Dose  8/3 1.1  7.5mg   8/4 1.1 7.5mg   8/5 1.9 5 mg  8/6 2.8 HOLD     Goal of Therapy:  INR 2-3 Heparin level 0.3-0.7 units/ml Monitor platelets by anticoagulation protocol: Yes   Plan:  INR is therapeutic, but jumped from 1.9 to 2.8. Will hold 8/6 dose. Predict INR may continue to trend up tomorrow. Daily  INR. Will continue heparin infusion until INR is therapeutic for 24 hours and for at least 5 days.   Heparin level is therapeutic. Will continue heparin infusion at 1800 units/hr. Recheck heparin level and CBC with AM labs.   Oswald Hillock, PharmD, BCPS Clinical Pharmacist 09/25/2021 7:32 AM

## 2021-09-25 NOTE — Progress Notes (Signed)
SUBJECTIVE: Patient is still short of breath   Vitals:   09/25/21 0031 09/25/21 0418 09/25/21 0810 09/25/21 1148  BP: (!) 135/55 (!) 148/58 (!) 166/64 (!) 129/59  Pulse: 75 78 83 79  Resp: 18 20 20 18   Temp: 98 F (36.7 C) 98 F (36.7 C) 98.4 F (36.9 C) 97.8 F (36.6 C)  TempSrc: Oral Oral Oral Oral  SpO2: 97% 97% 97% 97%  Weight:      Height:        Intake/Output Summary (Last 24 hours) at 09/25/2021 1254 Last data filed at 09/25/2021 0950 Gross per 24 hour  Intake 480 ml  Output 3150 ml  Net -2670 ml    LABS: Basic Metabolic Panel: Recent Labs    09/23/21 0436 09/24/21 0438  NA 137 137  K 4.0 4.0  CL 114* 110  CO2 20* 21*  GLUCOSE 126* 119*  BUN 19 17  CREATININE 0.88 0.96  CALCIUM 8.4* 8.6*  MG 1.9 2.0  PHOS 3.6 3.1   Liver Function Tests: No results for input(s): "AST", "ALT", "ALKPHOS", "BILITOT", "PROT", "ALBUMIN" in the last 72 hours. No results for input(s): "LIPASE", "AMYLASE" in the last 72 hours. CBC: Recent Labs    09/24/21 0438 09/25/21 0620  WBC 6.4 7.1  HGB 11.5* 12.7*  HCT 33.3* 37.5*  MCV 100.9* 101.4*  PLT 153 168   Cardiac Enzymes: No results for input(s): "CKTOTAL", "CKMB", "CKMBINDEX", "TROPONINI" in the last 72 hours. BNP: Invalid input(s): "POCBNP" D-Dimer: No results for input(s): "DDIMER" in the last 72 hours. Hemoglobin A1C: No results for input(s): "HGBA1C" in the last 72 hours. Fasting Lipid Panel: No results for input(s): "CHOL", "HDL", "LDLCALC", "TRIG", "CHOLHDL", "LDLDIRECT" in the last 72 hours. Thyroid Function Tests: No results for input(s): "TSH", "T4TOTAL", "T3FREE", "THYROIDAB" in the last 72 hours.  Invalid input(s): "FREET3" Anemia Panel: No results for input(s): "VITAMINB12", "FOLATE", "FERRITIN", "TIBC", "IRON", "RETICCTPCT" in the last 72 hours.   PHYSICAL EXAM General: Well developed, well nourished, in no acute distress HEENT:  Normocephalic and atramatic Neck:  No JVD.  Lungs: Clear bilaterally  to auscultation and percussion. Heart: HRRR . Normal S1 and S2 without gallops or murmurs.  Abdomen: Bowel sounds are positive, abdomen soft and non-tender  Msk:  Back normal, normal gait. Normal strength and tone for age. Extremities: No clubbing, cyanosis or edema.   Neuro: Alert and oriented X 3. Psych:  Good affect, responds appropriately  TELEMETRY: Normal sinus rhythm  ASSESSMENT AND PLAN: Atrial flutter with history of peripheral vascular disease and coronary artery disease.  INR today is 2.8 and probably will be discharged with follow-up in the office 1 to 2 weeks Dr. Marcelline Deist.  Principal Problem:   PAD (peripheral artery disease) (HCC) Active Problems:   Essential hypertension   GERD without esophagitis   Dyslipidemia   Depression   Atrial flutter (HCC)   Chronic obstructive pulmonary disease (COPD) ([place])   Coronary artery disease    Gregory Spink A, MD, [facility] 09/25/2021 12:54 PM

## 2021-09-25 NOTE — Progress Notes (Signed)
PROGRESS NOTE    Gregory Lane  LZJ:[id number] DOB: 11-30-1950 DOA: 09/21/2021 PCP: Cletis Athens, MD   Brief Narrative:  This 71 years old male with PMH significant for chronic kidney disease, ongoing tobacco abuse, CVA, atrial flutter on Eliquis, dyslipidemia, COPD, osteoarthritis who presented in the ED with acute onset of left leg pain that started 4 hours before he arrived to the ED.  Patient has taken ibuprofen without any improvement.  Imaging in the ED:  lower extremity venous duplex negative for DVT.  Patient has Abnormal  ABI and was noted to have diminished capillary refill.  Vascular surgery was consulted. Patient underwent abdominal aortogram, mechanical thrombectomy left SFA, left profunda femoris, and distal left common femoral artery.  Tolerated well.  Patient continued on heparin and started on Coumadin.  Assessment & Plan:   Principal Problem:   PAD (peripheral artery disease) (HCC) Active Problems:   Essential hypertension   GERD without esophagitis   Dyslipidemia   Depression   Atrial flutter (HCC)   Chronic obstructive pulmonary disease (COPD) (HCC)   Coronary artery disease  Peripheral arterial disease: Patient presented with significant left lower extremity ischemia with ABI of 0.4 Vascular surgery was consulted.   Patient started on Pletal. Eliquis was switched with IV heparin.. Patient underwent abdominal aortogram, mechanical thrombectomy left SFA, left profunda femoris and distal common femoral artery.  Postoperative day 3. Follow-up vascular surgery recommendations.  Follow-up in 3 weeks for noninvasive studies. Adequate pain control with pain medications  Coronary artery disease: Continue Imdur.  Hold Lopressor due to bradycardia. HR improved.  COPD: Continue home inhalers, not in any acute exacerbation.   Atrial fibrillation/flutter Eliquis was switched with IV heparin. Hold metoprolol, Cardizem due to bradycardia. Patient could not afford  Xarelto. Anticoagulation changed with Coumadin, started 08/03. Continue heparin until INR becomes therapeutic. INR 2.8.  Jumped from 1.9.  Will hold dose on 09/25/21.  Continue heparin for now. Continue Coumadin as per pharmacy.  Major depression: Continue Wellbutrin XL and Prozac    Dyslipidemia: Continue Lipitor 20 mg daily  GERD without esophagitis Continue Protonix.   Essential hypertension: Hold metoprolol 25 mg twice daily Hold Cardizem 120 mg twice daily Continue amlodipine 10 mg daily Lisinopril 40 mg daily.  Bradycardia: Heart rate runs in 40s to 50s. Hold metoprolol and Cardizem for now. Heart rate is improved.  Bradycardia resolved.   DVT prophylaxis: Coumadin/IV heparin Code Status: Full code Family Communication: (No family at bedside) Disposition Plan:  Status is: Inpatient Remains inpatient appropriate because:  Patient admitted with acute lower extremity ischemia underwent angiogram and thrombectomy.  Tolerated well , Patient started on Coumadin for anticoagulation.  Discharge home with home health services once INR becomes therapeutic.  Anticipated discharge Home with home services. 09/26/21     Consultants:  Cardiology Vascular surgery  Procedures: Abdominal aortogram, mechanical thrombectomy left lower extremity Antimicrobials:  Anti-infectives (From admission, onward)    Start     Dose/Rate Route Frequency Ordered Stop   09/21/21 0810  ceFAZolin (ANCEF) IVPB 1 g/50 mL premix        over 30 Minutes  Continuous PRN 09/21/21 0815 09/21/21 1323   09/21/21 0730  ceFAZolin (ANCEF) IVPB 2g/100 mL premix        2 g 200 mL/hr over 30 Minutes Intravenous On call to O.R. 09/21/21 0715 09/21/21 0840       Subjective: Patient was seen and examined at bedside.  Overnight events noted.   Patient reports feeling better.  He  stated his left leg is better,  he wants to be discharged. His INR is 2.8, Coumadin will be hold today.  Objective: Vitals:    09/25/21 0031 09/25/21 0418 09/25/21 0810 09/25/21 1148  BP: (!) 135/55 (!) 148/58 (!) 166/64 (!) 129/59  Pulse: 75 78 83 79  Resp: 18 20 20 18   Temp: 98 F (36.7 C) 98 F (36.7 C) 98.4 F (36.9 C) 97.8 F (36.6 C)  TempSrc: Oral Oral Oral Oral  SpO2: 97% 97% 97% 97%  Weight:      Height:        Intake/Output Summary (Last 24 hours) at 09/25/2021 1323 Last data filed at 09/25/2021 0950 Gross per 24 hour  Intake 480 ml  Output 3150 ml  Net -2670 ml   Filed Weights   09/20/21 2330  Weight: 108 kg    Examination:  General exam: Appears comfortable, not in any acute distress.  Deconditioned Respiratory system: CTA bilaterally, no wheezing, no crackles, normal respiratory effort. Cardiovascular system: S1-S2 heard, regular rate and rhythm, no murmur. Gastrointestinal system: Abdomen is soft, non tender, non distended, BS+ Central nervous system: Alert and oriented x3. No focal neurological deficits. Extremities: No edema, no cyanosis, no clubbing.  S/p aortogram and thrombectomy. Skin: No rashes, lesions or ulcers Psychiatry: Judgement and insight appear normal. Mood & affect appropriate.     Data Reviewed: I have personally reviewed following labs and imaging studies  CBC: Recent Labs  Lab 09/21/21 0254 09/21/21 1745 09/22/21 0205 09/23/21 0436 09/24/21 0438 09/25/21 0620  WBC 10.6* 8.1 8.4 7.8 6.4 7.1  NEUTROABS 9.3*  --   --   --   --   --   HGB 15.5 13.4 12.3* 12.1* 11.5* 12.7*  HCT 46.0 39.8 36.3* 35.7* 33.3* 37.5*  MCV 102.9* 102.1* 102.8* 101.7* 100.9* 101.4*  PLT 199 171 162 142* 153 865   Basic Metabolic Panel: Recent Labs  Lab 09/21/21 0254 09/21/21 1745 09/23/21 0436 09/24/21 0438  NA 138 139 137 137  K 4.7 4.3 4.0 4.0  CL 110 113* 114* 110  CO2 20* 22 20* 21*  GLUCOSE 117* 138* 126* 119*  BUN 30* 22 19 17   CREATININE 1.21 0.85 0.88 0.96  CALCIUM 8.8* 8.3* 8.4* 8.6*  MG  --   --  1.9 2.0  PHOS  --   --  3.6 3.1   GFR: Estimated Creatinine  Clearance: 88.3 mL/min (by C-G formula based on SCr of 0.96 mg/dL). Liver Function Tests: No results for input(s): "AST", "ALT", "ALKPHOS", "BILITOT", "PROT", "ALBUMIN" in the last 168 hours. No results for input(s): "LIPASE", "AMYLASE" in the last 168 hours. No results for input(s): "AMMONIA" in the last 168 hours. Coagulation Profile: Recent Labs  Lab 09/21/21 0254 09/23/21 0436 09/24/21 0438 09/25/21 0620  INR 1.1 1.1 1.9* 2.8*   Cardiac Enzymes: No results for input(s): "CKTOTAL", "CKMB", "CKMBINDEX", "TROPONINI" in the last 168 hours. BNP (last 3 results) No results for input(s): "PROBNP" in the last 8760 hours. HbA1C: No results for input(s): "HGBA1C" in the last 72 hours. CBG: Recent Labs  Lab 09/25/21 0809  GLUCAP 98   Lipid Profile: No results for input(s): "CHOL", "HDL", "LDLCALC", "TRIG", "CHOLHDL", "LDLDIRECT" in the last 72 hours. Thyroid Function Tests: No results for input(s): "TSH", "T4TOTAL", "FREET4", "T3FREE", "THYROIDAB" in the last 72 hours. Anemia Panel: No results for input(s): "VITAMINB12", "FOLATE", "FERRITIN", "TIBC", "IRON", "RETICCTPCT" in the last 72 hours. Sepsis Labs: No results for input(s): "PROCALCITON", "LATICACIDVEN" in the last 168  hours.  No results found for this or any previous visit (from the past 240 hour(s)).   Radiology Studies: No results found.  Scheduled Meds:  amLODipine  10 mg Oral Q1400   atorvastatin  20 mg Oral Daily   buPROPion  300 mg Oral Daily   cilostazol  100 mg Oral BID   FLUoxetine  40 mg Oral Daily   isosorbide mononitrate  60 mg Oral Daily   lisinopril  40 mg Oral Daily   mometasone-formoterol  2 puff Inhalation BID   nicotine  7 mg Transdermal Daily   pantoprazole  40 mg Oral Daily   potassium chloride SA  20 mEq Oral BID   sodium chloride flush  3 mL Intravenous Q12H   Warfarin - Pharmacist Dosing Inpatient   Does not apply q1600   Continuous Infusions:  sodium chloride Stopped (09/22/21 1700)    sodium chloride     heparin 1,800 Units/hr (09/25/21 0956)     LOS: 4 days    Time spent: 35 mins    Carinne Brandenburger, MD [facility]   If 7PM-7AM, please contact night-coverage

## 2021-09-25 NOTE — Plan of Care (Signed)
Pt AAOx4, No pain. VS are stable. Plan for to continue heparin infusion and mobility as tolerated. Bed in lowest position, call light within in reach. Will continue to monitor.

## 2021-09-25 NOTE — TOC Progression Note (Addendum)
Transition of Care [facility]) - Progression Note    Patient Details  Name: Gregory Lane MRN: [id number] Date of Birth: 11-Feb-1951  Transition of [facility]) CM/SW Contact  Izola Price, RN Phone Number: 09/25/2021, 1:31 PM  Clinical Narrative:  8/6: Per provider, patient will need DME home oxygen. Pending ambulatory oxygen test but most likely 2L/[place] for transport and continuous. Will contact [employer] when test/order complete. Unit RN upated as well. EDD Monday. Simmie Davies RN CM    Update: 520 pm: Per unit RN: 94% on RA at rest 92% on RA with ambulation. Not documented yet, but provider updated. Simmie Davies RN CM     Expected Discharge Plan: [place] Barriers to Discharge: Continued Medical Work up  Expected Discharge Plan and Services Expected Discharge Plan: [place] arrangements for the past 2 months: Single Family Home                           HH Arranged: PT [place]: [place] Date [place]: 09/23/21 Time [place]: [phone] Representative spoke with at [place]: Meg   Social Determinants of Health ([place]) Interventions    Readmission Risk Interventions     No data to display

## 2021-09-25 NOTE — Progress Notes (Signed)
Patient is alert and oriented x4. Has polyuria that he said is due to meds. He denied pain. Continues to get the heparin @ 64ml/hr. Got up to chair throughout the night and staff completed a total bed change for him. Asked him about getting a bath but he refused stating that he got one yesterday. While assessing patient and administering meds, noticed that patient had his Dulera inhaler in the bed with him. Patient states that he had been taking 2 puffs of the inhaler ever 4 hours even though it is scheduled for BID. Educated the patient that he cannot take inhaler for more times that it is prescribed due to side effects. Informed him that if he is short of breath after getting Dulera, the next medication we can try is the neb tx.Took Dulera and placed back in patient's med bin in med room. Denied additional needs.

## 2021-09-26 DIAGNOSIS — I739 Peripheral vascular disease, unspecified: Secondary | ICD-10-CM | POA: Diagnosis not present

## 2021-09-26 LAB — CBC
HCT: 37.1 % — ABNORMAL LOW (ref 39.0–52.0)
Hemoglobin: 12.6 g/dL — ABNORMAL LOW (ref 13.0–17.0)
MCH: 34.1 pg — ABNORMAL HIGH (ref 26.0–34.0)
MCHC: 34 g/dL (ref 30.0–36.0)
MCV: 100.5 fL — ABNORMAL HIGH (ref 80.0–100.0)
Platelets: 178 10*3/uL (ref 150–400)
RBC: 3.69 MIL/uL — ABNORMAL LOW (ref 4.22–5.81)
RDW: 12.7 % (ref 11.5–15.5)
WBC: 7.1 10*3/uL (ref 4.0–10.5)
nRBC: 0 % (ref 0.0–0.2)

## 2021-09-26 LAB — PROTIME-INR
INR: 2.6 — ABNORMAL HIGH (ref 0.8–1.2)
Prothrombin Time: 27.7 seconds — ABNORMAL HIGH (ref 11.4–15.2)

## 2021-09-26 LAB — HEPARIN LEVEL (UNFRACTIONATED): Heparin Unfractionated: 0.42 IU/mL (ref 0.30–0.70)

## 2021-09-26 MED ORDER — WARFARIN SODIUM 4 MG PO TABS: 4.0000 mg | ORAL_TABLET | Freq: Every day | ORAL | 0 refills | Status: DC

## 2021-09-26 MED ORDER — CILOSTAZOL 100 MG PO TABS: 100.0000 mg | ORAL_TABLET | Freq: Two times a day (BID) | ORAL | 1 refills | Status: DC

## 2021-09-26 MED ORDER — WARFARIN SODIUM 4 MG PO TABS
4.0000 mg | ORAL_TABLET | Freq: Every day | ORAL | Status: DC
  Filled 2021-09-26: qty 1

## 2021-09-26 NOTE — Progress Notes (Signed)
[place] for warfarin + heparin bridge infusion Indication: arterial embolism of the left leg  No Known Allergies  Patient Measurements: Height: 6\' 1"  (185.4 cm) Weight: 108 kg (238 lb 1.6 oz) IBW/kg (Calculated) : 79.9 Heparin Dosing Weight: 102.3 kg  Vital Signs: Temp: 98 F (36.7 C) (08/07 0338) Temp Source: Oral (08/07 0338) BP: 145/64 (08/07 0338) Pulse Rate: 81 (08/07 0338)  Labs: Recent Labs    09/24/21 0438 09/25/21 0620 09/26/21 0421  HGB 11.5* 12.7* 12.6*  HCT 33.3* 37.5* 37.1*  PLT 153 168 178  LABPROT 21.8* 29.2* 27.7*  INR 1.9* 2.8* 2.6*  HEPARINUNFRC 0.58 0.50 0.42  CREATININE 0.96  --   --      Estimated Creatinine Clearance: 88.3 mL/min (by C-G formula based on SCr of 0.96 mg/dL).   Medical History: Past Medical History:  Diagnosis Date   Aortic atherosclerosis ([place]) 02/16/2017   Arthritis    Blind left eye    Hypertension    Low TSH level 02/17/2017   Myocardial infarction ([place])    Perforated duodenal ulcer ([place])    Stroke (HCC)     Medications:  PTA: Prescribed Eliquis,  however pt states he never had rx filled b/c it was too expensive.  Inpatient: heparin infusion (8/2 > ), warfarin (8/2 > ) Allergies: NKDA.   Assessment: 71 year old male admitted with left lower leg arterial embolism. Now s/p lower extremity angiography. Pharmacy consulted to re-start heparin.s/p mechanical thrombectomy on 8/2.    Date Time HL Rate/Comment 8/2 1745 0.58 Therapeutic x 1 8/3 0205 0.65 Therapeutic x 2 8/4 0436 0.60  Therapeutic x 3 8/5 0438 0.58 Therapeutic  8/6 0620 0.5 Therapeutic 8/7       0421   0.42     Therapeutic X 6   Date INR Dose  8/3 1.1  7.5mg   8/4 1.1 7.5mg   8/5 1.9 5 mg  8/6 2.8 HOLD     Goal of Therapy:  INR 2-3 Heparin level 0.3-0.7 units/ml Monitor platelets by anticoagulation protocol: Yes   Plan:  8/7:  HL @ 0.42, therapeutic X 6 Will continue pt on current rate and recheck  HL on 8/8 with AM labs.   Talley Casco D Clinical Pharmacist 09/26/2021 5:22 AM

## 2021-09-26 NOTE — Progress Notes (Signed)
SUBJECTIVE: Still short of breath   Vitals:   09/25/21 1644 09/25/21 2035 09/25/21 2337 09/26/21 0338  BP:  (!) 120/51 (!) 123/48 (!) 145/64  Pulse: (!) 109 81 82 81  Resp:  18 15 15   Temp:  99 F (37.2 C) 98.1 F (36.7 C) 98 F (36.7 C)  TempSrc:  Oral Oral Oral  SpO2: 92% 97% 96% 95%  Weight:      Height:        Intake/Output Summary (Last 24 hours) at 09/26/2021 0914 Last data filed at 09/26/2021 0336 Gross per 24 hour  Intake 1102.45 ml  Output 1850 ml  Net -747.55 ml    LABS: Basic Metabolic Panel: Recent Labs    09/24/21 0438  NA 137  K 4.0  CL 110  CO2 21*  GLUCOSE 119*  BUN 17  CREATININE 0.96  CALCIUM 8.6*  MG 2.0  PHOS 3.1   Liver Function Tests: No results for input(s): "AST", "ALT", "ALKPHOS", "BILITOT", "PROT", "ALBUMIN" in the last 72 hours. No results for input(s): "LIPASE", "AMYLASE" in the last 72 hours. CBC: Recent Labs    09/25/21 0620 09/26/21 0421  WBC 7.1 7.1  HGB 12.7* 12.6*  HCT 37.5* 37.1*  MCV 101.4* 100.5*  PLT 168 178   Cardiac Enzymes: No results for input(s): "CKTOTAL", "CKMB", "CKMBINDEX", "TROPONINI" in the last 72 hours. BNP: Invalid input(s): "POCBNP" D-Dimer: No results for input(s): "DDIMER" in the last 72 hours. Hemoglobin A1C: No results for input(s): "HGBA1C" in the last 72 hours. Fasting Lipid Panel: No results for input(s): "CHOL", "HDL", "LDLCALC", "TRIG", "CHOLHDL", "LDLDIRECT" in the last 72 hours. Thyroid Function Tests: No results for input(s): "TSH", "T4TOTAL", "T3FREE", "THYROIDAB" in the last 72 hours.  Invalid input(s): "FREET3" Anemia Panel: No results for input(s): "VITAMINB12", "FOLATE", "FERRITIN", "TIBC", "IRON", "RETICCTPCT" in the last 72 hours.   PHYSICAL EXAM General: Well developed, well nourished, in no acute distress HEENT:  Normocephalic and atramatic Neck:  No JVD.  Lungs: Clear bilaterally to auscultation and percussion. Heart: HRRR . Normal S1 and S2 without gallops or murmurs.   Abdomen: Bowel sounds are positive, abdomen soft and non-tender  Msk:  Back normal, normal gait. Normal strength and tone for age. Extremities: No clubbing, cyanosis or edema.   Neuro: Alert and oriented X 3. Psych:  Good affect, responds appropriately  TELEMETRY: Sinus rhythm  ASSESSMENT AND PLAN: Peripheral vascular disease and atrial flutter.  INR has become therapeutic of 2.6.  Patient can be discharged with follow-up in the clinic in 1 to 2 weeks.  Principal Problem:   PAD (peripheral artery disease) (HCC) Active Problems:   Essential hypertension   GERD without esophagitis   Dyslipidemia   Depression   Atrial flutter (HCC)   Chronic obstructive pulmonary disease (COPD) ([place])   Coronary artery disease    Gregory Lane A, MD, [facility] 09/26/2021 9:14 AM

## 2021-09-26 NOTE — Progress Notes (Signed)
Physical Therapy Treatment Patient Details Name: Gregory Lane MRN: [id number] DOB: 11-15-1950 Today's Date: 09/26/2021   History of Present Illness Pt is a 71 y.o. male presenting to hospital 09/20/21 for evaluation of acute on chronic L leg pain.  Pt admitted with PAD with L LE ischemia with ABI of 0.4.  S/p mechanical thrombectomy 8/2 d/t atherosclerotic occlusive disease B LE's with rest pain and ischemic changes of the L foot.  PMH includes aflutter on eliquis, htn, HLD, CVA, COPD, CKD, blind L eye, MI.    PT Comments    The pt demonstrates improved independence with sit<>stand transfers this session. He continues to present with shortened step length during turns, but is able, with cueing to demonstrate increased step lengths during ambulation in a straight path. Current plan of home with HHPT continues to be appropriate. The pt would greatly benefit from intermittent assistance for safety and further education of supplemental oxygen needs.    Recommendations for follow up therapy are one component of a multi-disciplinary discharge planning process, led by the attending physician.  Recommendations may be updated based on patient status, additional functional criteria and insurance authorization.  Follow Up Recommendations  Home health PT     Assistance Recommended at Discharge Intermittent Supervision/Assistance  Patient can return home with the following A little help with walking and/or transfers;A little help with bathing/dressing/bathroom;Assistance with cooking/housework;Assist for transportation;Help with stairs or ramp for entrance   Equipment Recommendations  Rolling walker (2 wheels);BSC/3in1    Recommendations for Other Services       Precautions / Restrictions Precautions Precautions: Fall Restrictions Weight Bearing Restrictions: No     Mobility  Bed Mobility Overal bed mobility: Needs Assistance Bed Mobility: Supine to Sit     Supine to sit: Supervision, HOB  elevated     General bed mobility comments: Pt left in bedside chair, educated on need to use call bell for transition back to bed.    Transfers Overall transfer level: Needs assistance Equipment used: Rolling walker (2 wheels) Transfers: Sit to/from Stand Sit to Stand: Min guard           General transfer comment: Pt with improved technique for sit<>stand transfer.    Ambulation/Gait Ambulation/Gait assistance: Min guard Gait Distance (Feet): 120 Feet Assistive device: Rolling walker (2 wheels) (w/c follow.) Gait Pattern/deviations: Narrow base of support, Decreased step length - right, Decreased step length - left       General Gait Details: Pt demonstrates decreased step lengths bilaterally during turns. Pt is educated on increased step length during gait for improvements in foot clearance during gait cycle.   Stairs             Wheelchair Mobility    Modified Rankin (Stroke Patients Only)       Balance     Sitting balance-Leahy Scale: Normal       Standing balance-Leahy Scale: Good                              Cognition Arousal/Alertness: Awake/alert Behavior During Therapy: WFL for tasks assessed/performed Overall Cognitive Status: Within Functional Limits for tasks assessed                                          Exercises      General Comments  Pertinent Vitals/Pain Pain Assessment Pain Assessment: No/denies pain    Home Living                          Prior Function            PT Goals (current goals can now be found in the care plan section) Acute Rehab PT Goals Patient Stated Goal: to improve LE pain PT Goal Formulation: With patient Time For Goal Achievement: 10/06/21 Potential to Achieve Goals: Fair Progress towards PT goals: Progressing toward goals    Frequency    Min 2X/week      PT Plan      Co-evaluation              AM-PAC PT "6 Clicks" Mobility    Outcome Measure  Help needed turning from your back to your side while in a flat bed without using bedrails?: A Little Help needed moving from lying on your back to sitting on the side of a flat bed without using bedrails?: A Little Help needed moving to and from a bed to a chair (including a wheelchair)?: A Little Help needed standing up from a chair using your arms (e.g., wheelchair or bedside chair)?: A Little Help needed to walk in hospital room?: A Little Help needed climbing 3-5 steps with a railing? : A Little 6 Click Score: 18    End of Session Equipment Utilized During Treatment: Gait belt;Oxygen Activity Tolerance: Patient tolerated treatment well Patient left: in chair;with call bell/phone within reach (chair alarm not present in room. PT educated on need to use call bell for all transfers.) Nurse Communication: Mobility status PT Visit Diagnosis: Other abnormalities of gait and mobility (R26.89);Muscle weakness (generalized) (M62.81);History of falling (Z91.81);Pain;Unsteadiness on feet (R26.81) Pain - Right/Left: Right Pain - part of body: Leg     Time: 3009-2330 PT Time Calculation (min) (ACUTE ONLY): 24 min  Charges:  $Gait Training: 23-37 mins                     11:48 AM, 09/26/21 [name] A. Saverio Danker PT, DPT Physical Therapist - [facility]    [name] 09/26/2021, 11:45 AM

## 2021-09-26 NOTE — Care Management Important Message (Signed)
Important Message  Patient Details  Name: Gregory Lane MRN: [id number] Date of Birth: Mar 27, 1950   Medicare Important Message Given:  Yes     Dannette Barbara 09/26/2021, 12:21 PM

## 2021-09-26 NOTE — Progress Notes (Addendum)
CSW informed [facility] of patient's dc today, HH orders in. Patient does not qualify for oxygen per RN, MD made aware.   Patient requests walker be delivered to home, dme orders are in and referral given to [facility] with [facility].   No further discharge needs.   [place], [place]

## 2021-09-26 NOTE — Progress Notes (Signed)
[place] for warfarin + heparin bridge infusion Indication: arterial embolism of the left leg  No Known Allergies  Patient Measurements: Height: 6\' 1"  (185.4 cm) Weight: 108 kg (238 lb 1.6 oz) IBW/kg (Calculated) : 79.9 Heparin Dosing Weight: 102.3 kg  Vital Signs: Temp: 98 F (36.7 C) (08/07 0338) Temp Source: Oral (08/07 0338) BP: 145/64 (08/07 0338) Pulse Rate: 81 (08/07 0338)  Labs: Recent Labs    09/24/21 0438 09/25/21 0620 09/26/21 0421  HGB 11.5* 12.7* 12.6*  HCT 33.3* 37.5* 37.1*  PLT 153 168 178  LABPROT 21.8* 29.2* 27.7*  INR 1.9* 2.8* 2.6*  HEPARINUNFRC 0.58 0.50 0.42  CREATININE 0.96  --   --      Estimated Creatinine Clearance: 88.3 mL/min (by C-G formula based on SCr of 0.96 mg/dL).   Medical History: Past Medical History:  Diagnosis Date   Aortic atherosclerosis ([place]) 02/16/2017   Arthritis    Blind left eye    Hypertension    Low TSH level 02/17/2017   Myocardial infarction ([place])    Perforated duodenal ulcer ([place])    Stroke (HCC)     Medications:  PTA: Prescribed Eliquis,  however pt states he never had rx filled b/c it was too expensive.  Inpatient: heparin infusion (8/2 > ), warfarin (8/2 > ) Allergies: NKDA.   Assessment: 71 year old male admitted with left lower leg arterial embolism. Now s/p lower extremity angiography. Pharmacy consulted to re-start heparin.s/p mechanical thrombectomy on 8/2.    Date Time HL Rate/Comment 8/2 1745 0.58 Therapeutic x 1 8/3 0205 0.65 Therapeutic x 2 8/4 0436 0.60  Therapeutic x 3 8/5 0438 0.58 Therapeutic  8/6 0620 0.5 Therapeutic 8/7       0421   0.42     Therapeutic    Date INR Dose  8/3 1.1  7.5mg   8/4 1.1 7.5mg   8/5 1.9 5 mg  8/6 2.8 HOLD  8/7 2.6 4mg  daily       Goal of Therapy:  INR 2-3 Heparin level 0.3-0.7 units/ml Monitor platelets by anticoagulation protocol: Yes   Plan:  Afib (unable to afford DOAC) and mechanical thrombectomy on  8/2 on heparin and warfarin. INR therapeutic x 2 in 48 hrs. Discussed heparin infusion with vascular and okay to dc. Cumulative warfarin dose of 20mg  yield therapeutic INR. Will expect 22-24 mg/week need for stable therapeutic dose.  Resume warfarin at 4mg  daily today (compensate for HELD dose on 8/6), and follow up INR closely.  Maddalena Linarez Rodriguez-Guzman PharmD, BCPS 09/26/2021 8:41 AM

## 2021-09-26 NOTE — Discharge Summary (Addendum)
Physician Discharge Summary  Gregory Lane:[id number] DOB: Jan 08, 1951 DOA: 09/21/2021  PCP: Gregory Athens, MD  Admit date: 09/21/2021  Discharge date: 09/26/2021  Admitted From: Home.  Disposition:  Home health services  Recommendations for Outpatient Follow-up:  Follow up with PCP in 1-2 weeks. Please obtain BMP/CBC in one week Advised to follow-up with cardiology as scheduled. Advised to take Coumadin 4 mg daily for history of A-fib. Advised to follow-up with vascular surgery Dr. Ronalee Belts in 2 weeks.  Home Health:Home PT/OT Equipment/Devices: Home oxygen  Discharge Condition: Stable CODE STATUS:Full code Diet recommendation: Heart Healthy  Brief [name] Course: This 71 years old male with PMH significant for chronic kidney disease, ongoing tobacco abuse, CVA, atrial flutter on Eliquis, dyslipidemia, COPD, osteoarthritis who presented in the ED with acute onset of left leg pain that started 4 hours before he arrived to the ED.  Patient has taken ibuprofen without any improvement.  Imaging in the ED:  lower extremity venous duplex negative for DVT.  Patient has Abnormal  ABI and was noted to have diminished capillary refill.  Patient was admitted with critical ischemia in the left lower extremity.  Vascular surgery was consulted. Patient underwent abdominal aortogram, mechanical thrombectomy left SFA, left profunda femoris, and distal left common femoral artery.  Tolerated well.  Patient continued on IV heparin and started on Coumadin.  Cardiology was consulted for atrial fibrillation with RVR.  Heart rate remains controlled.  Patient started on Coumadin while bridging with heparin.  INR becomes therapeutic.  Patient feels better and wants to be discharged.  Patient is being discharged home Home health services been arranged patient will follow-up with Dr. Humphrey Rolls cardiology for INR check in 1 week.  Home health services been arranged.  Discharge Diagnoses:  Principal Problem:    PAD (peripheral artery disease) ([facility]) Active Problems:   Essential hypertension   GERD without esophagitis   Dyslipidemia   Depression   Atrial flutter ([facility])   Chronic obstructive pulmonary disease (COPD) ([facility])   Coronary artery disease  Peripheral arterial disease: Patient presented with significant left lower extremity ischemia with ABI of 0.4 Vascular surgery was consulted.   Patient started on Pletal. Eliquis was switched with IV heparin.. Patient underwent abdominal aortogram, mechanical thrombectomy left SFA, left profunda femoris and distal common femoral artery.  Postoperative day 5. Follow-up vascular surgery recommendations.  Follow-up in 3 weeks for noninvasive studies. Adequate pain control with pain medications   Coronary artery disease: Continue Imdur.  Hold Lopressor due to bradycardia. HR improved. Lopresser resumed.   COPD: Continue home inhalers, not in any acute exacerbation.   Atrial fibrillation/flutter Eliquis was switched with IV heparin. Hold metoprolol, Cardizem due to bradycardia. Patient could not afford Xarelto. Anticoagulation changed with Coumadin, started 08/03. Continue heparin until INR becomes therapeutic. INR 2.8.  Jumped from 1.9.  Will hold dose on 09/25/21.  Continue heparin for now. Continue Coumadin as per pharmacy.   Major depression: Continue Wellbutrin XL and Prozac    Dyslipidemia: Continue Lipitor 20 mg daily   GERD without esophagitis Continue Protonix.   Essential hypertension: Resumed metoprolol 25 mg twice daily Resumed Cardizem 120 mg twice daily Discontinue  amlodipine 10 mg daily Lisinopril 40 mg daily.   Bradycardia: Heart rate runs in 40s to 50s. Heart rate is improved.  Bradycardia resolved.  Discharge Instructions  Discharge Instructions     Call MD for:  difficulty breathing, headache or visual disturbances   Complete by: As directed    Call MD  for:  persistant dizziness or light-headedness   Complete  by: As directed    Call MD for:  persistant nausea and vomiting   Complete by: As directed    Diet - low sodium heart healthy   Complete by: As directed    Diet Carb Modified   Complete by: As directed    Discharge instructions   Complete by: As directed    Advised to follow-up with primary care physician in 1 week. Advised to follow-up with cardiology as scheduled. Advised to take Coumadin 4 mg daily for history of PE and A-fib. Advised to follow-up with vascular surgery Dr. Ronalee Belts in 2 weeks.   Increase activity slowly   Complete by: As directed       Allergies as of 09/26/2021   No Known Allergies      Medication List     STOP taking these medications    albuterol 108 (90 Base) MCG/ACT inhaler Commonly known as: VENTOLIN HFA   amLODipine 10 MG tablet Commonly known as: NORVASC   apixaban 5 MG Tabs tablet Commonly known as: Eliquis   budesonide-formoterol 160-4.5 MCG/ACT inhaler Commonly known as: Symbicort   buPROPion 150 MG 24 hr tablet Commonly known as: WELLBUTRIN XL       TAKE these medications    atorvastatin 40 MG tablet Commonly known as: LIPITOR Take 0.5 tablets (20 mg total) by mouth daily.   cilostazol 100 MG tablet Commonly known as: PLETAL Take 1 tablet (100 mg total) by mouth 2 (two) times daily.   diltiazem 120 MG 12 hr capsule Commonly known as: CARDIZEM SR Take 1 capsule (120 mg total) by mouth every 12 (twelve) hours.   FLUoxetine 20 MG capsule Commonly known as: PROZAC Take 2 capsules (40 mg total) by mouth daily.   furosemide 20 MG tablet Commonly known as: LASIX Take 1 tablet (20 mg total) by mouth daily.   isosorbide mononitrate 60 MG 24 hr tablet Commonly known as: IMDUR Take 60 mg by mouth daily.   lisinopril 40 MG tablet Commonly known as: ZESTRIL Take 1 tablet (40 mg total) by mouth daily.   metoprolol tartrate 25 MG tablet Commonly known as: LOPRESSOR Take 1 tablet (25 mg total) by mouth 2 (two) times daily.    pantoprazole 40 MG tablet Commonly known as: PROTONIX Take 1 tablet (40 mg total) by mouth daily.   potassium chloride SA 20 MEQ tablet Commonly known as: KLOR-CON M Take 1 tablet (20 mEq total) by mouth 2 (two) times daily.   warfarin 4 MG tablet Commonly known as: COUMADIN Take 1 tablet (4 mg total) by mouth daily at 4 PM.               Durable Medical Equipment  (From admission, onward)           Start     Ordered   09/26/21 1115  For home use only DME oxygen  Once       Question Answer Comment  Length of Need Lifetime   Mode or (Route) Nasal cannula   Liters per Minute 2   Frequency Continuous (stationary and portable oxygen unit needed)   Oxygen conserving device Yes   Oxygen delivery system Gas      09/26/21 1114            Follow-up Information     Gregory Athens, MD Follow up in 1 week(s).   Specialties: Internal Medicine, Cardiology Contact information: [address] [place] [address] [phone]  Dionisio David, MD. Call in 1 week(s).   Specialty: Cardiology Contact information: [address] [place] [address] [phone]         Delana Meyer, Dolores Lory, MD. Go in 2 week(s).   Specialties: Vascular Surgery, Cardiology, Radiology, Vascular Surgery Why: Appointment on Thursday 10/13/21 at 2:45pm Contact information: [address] [place] [address] [phone]                No Known Allergies  Consultations: Cardiology Vascular surgery   Procedures/Studies: ECHOCARDIOGRAM COMPLETE  Result Date: 09/21/2021    ECHOCARDIOGRAM REPORT   Patient Name:   MIKAI MEINTS Date of Exam: 09/21/2021 Medical Rec #:  [id number]        Height:       73.0 in Accession #:    [id number]       Weight:       238.1 lb Date of Birth:  Apr 17, 1950        BSA:          2.317 m Patient Age:    71 years         BP:           144/61 mmHg Patient Gender: M                HR:           52 bpm. Exam Location:  [facility] Procedure: 2D  Echo, Color Doppler and Cardiac Doppler Indications:     I48.92 Atrial Flutter  History:         Patient has prior history of Echocardiogram examinations, most                  recent 02/17/2017. Previous Myocardial Infarction, Stroke; Risk                  Factors:Hypertension and Current Smoker.  Sonographer:     Charmayne Sheer Referring Phys:  [id number] Harris TANG Diagnosing Phys: Yolonda Kida MD  Sonographer Comments: Technically difficult study due to poor echo windows and suboptimal parasternal window. IMPRESSIONS  1. Left ventricular ejection fraction, by estimation, is 55 to 60%. The left ventricle has normal function. The left ventricle has no regional wall motion abnormalities. The left ventricular internal cavity size was moderately dilated. There is mild left ventricular hypertrophy. Left ventricular diastolic parameters were normal.  2. Right ventricular systolic function is normal. The right ventricular size is normal.  3. Left atrial size was mild to moderately dilated.  4. The mitral valve is normal in structure. Trivial mitral valve regurgitation.  5. The aortic valve is grossly normal. Aortic valve regurgitation is not visualized. FINDINGS  Left Ventricle: Left ventricular ejection fraction, by estimation, is 55 to 60%. The left ventricle has normal function. The left ventricle has no regional wall motion abnormalities. The left ventricular internal cavity size was moderately dilated. There is mild left ventricular hypertrophy. Left ventricular diastolic parameters were normal. Right Ventricle: The right ventricular size is normal. No increase in right ventricular wall thickness. Right ventricular systolic function is normal. Left Atrium: Left atrial size was mild to moderately dilated. Right Atrium: Right atrial size was normal in size. Pericardium: There is no evidence of pericardial effusion. Mitral Valve: The mitral valve is normal in structure. Trivial mitral valve regurgitation.  Tricuspid Valve: The tricuspid valve is normal in structure. Tricuspid valve regurgitation is not demonstrated. Aortic Valve: The aortic valve is grossly normal. Aortic valve regurgitation is not visualized. Aortic valve  mean gradient measures 6.0 mmHg. Aortic valve peak gradient measures 12.4 mmHg. Aortic valve area, by VTI measures 3.58 cm. Pulmonic Valve: The pulmonic valve was normal in structure. Pulmonic valve regurgitation is not visualized. Aorta: The ascending aorta was not well visualized. IAS/Shunts: No atrial level shunt detected by color flow Doppler.  LEFT VENTRICLE PLAX 2D LVIDd:         5.63 cm      Diastology LVIDs:         3.80 cm      LV e' medial:    8.92 cm/s LV PW:         1.40 cm      LV E/e' medial:  9.9 LV IVS:        0.95 cm      LV e' lateral:   12.90 cm/s LVOT diam:     2.40 cm      LV E/e' lateral: 6.8 LV SV:         112 LV SV Index:   48 LVOT Area:     4.52 cm  LV Volumes (MOD) LV vol d, MOD A4C: 151.0 ml LV vol s, MOD A4C: 78.7 ml LV SV MOD A4C:     151.0 ml RIGHT VENTRICLE RV Basal diam:  3.83 cm LEFT ATRIUM             Index        RIGHT ATRIUM           Index LA diam:        4.90 cm 2.11 cm/m   RA Area:     15.10 cm LA Vol (A2C):   52.2 ml 22.53 ml/m  RA Volume:   35.00 ml  15.10 ml/m LA Vol (A4C):   60.2 ml 25.98 ml/m LA Biplane Vol: 56.5 ml 24.38 ml/m  AORTIC VALVE                     PULMONIC VALVE AV Area (Vmax):    3.47 cm      PV Vmax:       1.00 m/s AV Area (Vmean):   3.52 cm      PV Peak grad:  4.0 mmHg AV Area (VTI):     3.58 cm AV Vmax:           176.00 cm/s AV Vmean:          110.000 cm/s AV VTI:            0.313 m AV Peak Grad:      12.4 mmHg AV Mean Grad:      6.0 mmHg LVOT Vmax:         135.00 cm/s LVOT Vmean:        85.500 cm/s LVOT VTI:          0.248 m LVOT/AV VTI ratio: 0.79  AORTA Ao Root diam: 3.10 cm MITRAL VALVE MV Area (PHT): 3.36 cm    SHUNTS MV Decel Time: 226 msec    Systemic VTI:  0.25 m MV E velocity: 88.30 cm/s  Systemic Diam: 2.40 cm MV A  velocity: 65.60 cm/s MV E/A ratio:  1.35 Yolonda Kida MD Electronically signed by Yolonda Kida MD Signature Date/Time: 09/21/2021/6:34:13 PM    Final    PERIPHERAL VASCULAR CATHETERIZATION  Result Date: 09/21/2021 See surgical note for result.  US Venous Img Lower Unilateral Left  Result Date: 09/21/2021 CLINICAL DATA:  Left lower extremity pain. EXAM: Left LOWER EXTREMITY  VENOUS DOPPLER ULTRASOUND TECHNIQUE: Gray-scale sonography with compression, as well as color and duplex ultrasound, were performed to evaluate the deep venous system(s) from the level of the common femoral vein through the popliteal and proximal calf veins. COMPARISON:  None Available. FINDINGS: VENOUS Normal compressibility of the common femoral, superficial femoral, and popliteal veins, as well as the visualized calf veins. Visualized portions of profunda femoral vein and great saphenous vein unremarkable. No filling defects to suggest DVT on grayscale or color Doppler imaging. Doppler waveforms show normal direction of venous flow, normal respiratory plasticity and response to augmentation. Limited views of the contralateral common femoral vein are unremarkable. OTHER None. Limitations: none IMPRESSION: Negative. Electronically Signed   By: Anner Crete M.D.   On: 09/21/2021 01:30   CT Head Wo Contrast  Result Date: 09/06/2021 CLINICAL DATA:  Right arm weakness difficulty walking EXAM: CT HEAD WITHOUT CONTRAST TECHNIQUE: Contiguous axial images were obtained from the base of the skull through the vertex without intravenous contrast. RADIATION DOSE REDUCTION: This exam was performed according to the departmental dose-optimization program which includes automated exposure control, adjustment of the mA and/or kV according to patient size and/or use of iterative reconstruction technique. COMPARISON:  CT brain 08/02/2020 FINDINGS: Brain: No acute territorial infarction, hemorrhage or intracranial mass. Encephalomalacia in the  right temporal lobe and right greater than left inferior frontal lobes. Atrophy and chronic small vessel ischemic changes of the white matter. Stable ventricle size. Vascular: No hyperdense vessels.  Carotid vascular calcification. Skull: Normal. Negative for fracture or focal lesion. Sinuses/Orbits: No acute finding. Other: None IMPRESSION: 1. No CT evidence for acute intracranial abnormality. 2. Atrophy and chronic small vessel ischemic changes of the white matter. Encephalomalacia within the right temporal lobe and bilateral frontal lobes. Electronically Signed   By: Donavan Foil M.D.   On: 09/06/2021 15:30   DG Chest 2 View  Result Date: 09/06/2021 CLINICAL DATA:  Dyspnea EXAM: CHEST - 2 VIEW COMPARISON:  08/02/2020 chest radiograph. FINDINGS: Stable cardiomediastinal silhouette with normal heart size. No pneumothorax. No pleural effusion. Lungs appear clear, with no acute consolidative airspace disease and no pulmonary edema. IMPRESSION: No active cardiopulmonary disease. Electronically Signed   By: Ilona Sorrel M.D.   On: 09/06/2021 13:52    Subjective: Patient was seen and examined at bedside.  Overnight events noted.   Patient reports feeling much better and wants to be discharged.   Patient is being discharged home,  Home and services been arranged.  Discharge Exam: Vitals:   09/26/21 1329 09/26/21 1332  BP:    Pulse:    Resp:    Temp:    SpO2: 93% 99%   Vitals:   09/26/21 1217 09/26/21 1324 09/26/21 1329 09/26/21 1332  BP: (!) 102/52 (!) 129/49    Pulse: 88 83    Resp: 17     Temp: 98 F (36.7 C)     TempSrc: Oral     SpO2: 95% 95% 93% 99%  Weight:      Height:        General: Pt is alert, awake, not in acute distress Cardiovascular: RRR, S1/S2 +, no rubs, no gallops Respiratory: CTA bilaterally, no wheezing, no rhonchi Abdominal: Soft, NT, ND, bowel sounds + Extremities: no edema, no cyanosis    The results of significant diagnostics from this hospitalization  (including imaging, microbiology, ancillary and laboratory) are listed below for reference.     Microbiology: No results found for this or any previous visit (from the past  240 hour(s)).   Labs: BNP (last 3 results) No results for input(s): "BNP" in the last 8760 hours. Basic Metabolic Panel: Recent Labs  Lab 09/21/21 0254 09/21/21 1745 09/23/21 0436 09/24/21 0438  NA 138 139 137 137  K 4.7 4.3 4.0 4.0  CL 110 113* 114* 110  CO2 20* 22 20* 21*  GLUCOSE 117* 138* 126* 119*  BUN 30* 22 19 17   CREATININE 1.21 0.85 0.88 0.96  CALCIUM 8.8* 8.3* 8.4* 8.6*  MG  --   --  1.9 2.0  PHOS  --   --  3.6 3.1   Liver Function Tests: No results for input(s): "AST", "ALT", "ALKPHOS", "BILITOT", "PROT", "ALBUMIN" in the last 168 hours. No results for input(s): "LIPASE", "AMYLASE" in the last 168 hours. No results for input(s): "AMMONIA" in the last 168 hours. CBC: Recent Labs  Lab 09/21/21 0254 09/21/21 1745 09/22/21 0205 09/23/21 0436 09/24/21 0438 09/25/21 0620 09/26/21 0421  WBC 10.6*   < > 8.4 7.8 6.4 7.1 7.1  NEUTROABS 9.3*  --   --   --   --   --   --   HGB 15.5   < > 12.3* 12.1* 11.5* 12.7* 12.6*  HCT 46.0   < > 36.3* 35.7* 33.3* 37.5* 37.1*  MCV 102.9*   < > 102.8* 101.7* 100.9* 101.4* 100.5*  PLT 199   < > 162 142* 153 168 178   < > = values in this interval not displayed.   Cardiac Enzymes: No results for input(s): "CKTOTAL", "CKMB", "CKMBINDEX", "TROPONINI" in the last 168 hours. BNP: Invalid input(s): "POCBNP" CBG: Recent Labs  Lab 09/25/21 0809  GLUCAP 98   D-Dimer No results for input(s): "DDIMER" in the last 72 hours. Hgb A1c No results for input(s): "HGBA1C" in the last 72 hours. Lipid Profile No results for input(s): "CHOL", "HDL", "LDLCALC", "TRIG", "CHOLHDL", "LDLDIRECT" in the last 72 hours. Thyroid function studies No results for input(s): "TSH", "T4TOTAL", "T3FREE", "THYROIDAB" in the last 72 hours.  Invalid input(s): "FREET3" Anemia work  up No results for input(s): "VITAMINB12", "FOLATE", "FERRITIN", "TIBC", "IRON", "RETICCTPCT" in the last 72 hours. Urinalysis    Component Value Date/Time   COLORURINE YELLOW (A) 08/02/2020 1225   APPEARANCEUR HAZY (A) 08/02/2020 1225   LABSPEC 1.019 08/02/2020 1225   PHURINE 5.0 08/02/2020 1225   GLUCOSEU NEGATIVE 08/02/2020 1225   HGBUR NEGATIVE 08/02/2020 1225   BILIRUBINUR NEGATIVE 08/02/2020 1225   KETONESUR 5 (A) 08/02/2020 1225   PROTEINUR NEGATIVE 08/02/2020 1225   UROBILINOGEN 0.2 08/02/2006 2101   NITRITE NEGATIVE 08/02/2020 1225   LEUKOCYTESUR SMALL (A) 08/02/2020 1225   Sepsis Labs Recent Labs  Lab 09/23/21 0436 09/24/21 0438 09/25/21 0620 09/26/21 0421  WBC 7.8 6.4 7.1 7.1   Microbiology No results found for this or any previous visit (from the past 240 hour(s)).   Time coordinating discharge: Over 30 minutes  SIGNED:   Shawna Clamp, MD  [facility] 09/26/2021, 3:41 PM Pager   If 7PM-7AM, please contact night-coverage

## 2021-09-26 NOTE — Discharge Instructions (Signed)
Advised to follow-up with primary care physician in 1 week. Advised to follow-up with cardiology as scheduled. Advised to take Coumadin 4 mg daily for history of PE and A-fib. Advised to follow-up with vascular surgery Dr. Ronalee Belts in 2 weeks.

## 2021-09-28 ENCOUNTER — Encounter: Payer: Self-pay | Admitting: Internal Medicine

## 2021-09-28 ENCOUNTER — Ambulatory Visit (INDEPENDENT_AMBULATORY_CARE_PROVIDER_SITE_OTHER): Payer: No Typology Code available for payment source | Admitting: Internal Medicine

## 2021-09-28 VITALS — BP 123/51 | HR 55 | Ht 73.0 in | Wt 233.2 lb

## 2021-09-28 DIAGNOSIS — E8881 Metabolic syndrome: Secondary | ICD-10-CM

## 2021-09-28 DIAGNOSIS — Z7901 Long term (current) use of anticoagulants: Secondary | ICD-10-CM | POA: Diagnosis not present

## 2021-09-28 DIAGNOSIS — J449 Chronic obstructive pulmonary disease, unspecified: Secondary | ICD-10-CM

## 2021-09-28 DIAGNOSIS — I69351 Hemiplegia and hemiparesis following cerebral infarction affecting right dominant side: Secondary | ICD-10-CM | POA: Diagnosis not present

## 2021-09-28 DIAGNOSIS — I10 Essential (primary) hypertension: Secondary | ICD-10-CM

## 2021-09-28 DIAGNOSIS — N189 Chronic kidney disease, unspecified: Secondary | ICD-10-CM | POA: Diagnosis not present

## 2021-09-28 DIAGNOSIS — I4892 Unspecified atrial flutter: Secondary | ICD-10-CM | POA: Diagnosis not present

## 2021-09-28 DIAGNOSIS — I739 Peripheral vascular disease, unspecified: Secondary | ICD-10-CM

## 2021-09-28 DIAGNOSIS — K219 Gastro-esophageal reflux disease without esophagitis: Secondary | ICD-10-CM

## 2021-09-28 DIAGNOSIS — E785 Hyperlipidemia, unspecified: Secondary | ICD-10-CM | POA: Diagnosis not present

## 2021-09-28 DIAGNOSIS — I129 Hypertensive chronic kidney disease with stage 1 through stage 4 chronic kidney disease, or unspecified chronic kidney disease: Secondary | ICD-10-CM | POA: Diagnosis not present

## 2021-09-28 DIAGNOSIS — Z48812 Encounter for surgical aftercare following surgery on the circulatory system: Secondary | ICD-10-CM | POA: Diagnosis not present

## 2021-09-28 DIAGNOSIS — I70203 Unspecified atherosclerosis of native arteries of extremities, bilateral legs: Secondary | ICD-10-CM | POA: Diagnosis not present

## 2021-09-28 LAB — POCT INR: INR: 2.7 (ref 2.0–3.0)

## 2021-09-28 NOTE — Assessment & Plan Note (Signed)
Patient has been started on Coumadin in the hospital we will recheck a pro time.  He complains of weakness of the right knee and leg.  Denies any chest pain or shortness of breath.

## 2021-09-28 NOTE — Assessment & Plan Note (Signed)
I told the patient to stop smoking completely

## 2021-09-28 NOTE — Progress Notes (Signed)
Established Patient Office Visit  Subjective:  Patient ID: Gregory Lane, male    DOB: 1950/09/28  Age: 71 y.o. MRN: [id number]  CC:  Chief Complaint  Patient presents with   Hospitalization Follow-up    HPI  Gregory Lane presents for follow up from hospital, wewill  do protime , coumadin was started in hospital flutist  Past Medical History:  Diagnosis Date   Aortic atherosclerosis ([place]) 02/16/2017   Arthritis    Blind left eye    Hypertension    Low TSH level 02/17/2017   Myocardial infarction ([place])    Perforated duodenal ulcer ([place])    Stroke [facility])     Past Surgical History:  Procedure Laterality Date   EXPLORATORY LAPAROTOMY  05/12/2015   EYE SURGERY     FRACTURE SURGERY     LAPAROTOMY N/A 05/10/2015   Procedure: EXPLORATORY LAPAROTOMY;  Surgeon: Ralene Ok, MD;  Location: [place];  Service: General;  Laterality: N/A;   LEFT HEART CATH AND CORONARY ANGIOGRAPHY N/A 05/02/2016   Procedure: Left Heart Cath and Coronary Angiography;  Surgeon: Adrian Prows, MD;  Location: [place] [facility];  Service: Cardiovascular;  Laterality: N/A;   LOWER EXTREMITY ANGIOGRAPHY Left 09/21/2021   Procedure: Lower Extremity Angiography;  Surgeon: Katha Cabal, MD;  Location: [place] [facility];  Service: Cardiovascular;  Laterality: Left;    Family History  Problem Relation Age of Onset   Cirrhosis Father    Hypertension Mother    Lung cancer Sister    Hypertension Sister    Arthritis Sister    Heart murmur Sister     Social History   Socioeconomic History   Marital status: Single    Spouse name: Not on file   Number of children: Not on file   Years of education: Not on file   Highest education level: Not on file  Occupational History   Not on file  Tobacco Use   Smoking status: Every Day    Packs/day: 1.00    Years: 60.00    Total pack years: 60.00    Types: Cigarettes   Smokeless tobacco: Never  Substance and Sexual Activity   Alcohol use: No     Alcohol/week: 60.0 standard drinks of alcohol    Types: 60 Standard drinks or equivalent per week    Comment: daily 12 pk of beer   Drug use: No   Sexual activity: Not Currently  Other Topics Concern   Not on file  Social History Narrative   [place] Veteran. Lives alone   Social Determinants of Health   Financial Resource Strain: Low Risk  (09/09/2021)   Overall Financial Resource Strain (CARDIA)    Difficulty of Paying Living Expenses: Not very hard  Food Insecurity: No Food Insecurity (09/09/2021)   Hunger Vital Sign    Worried About Running Out of Food in the Last Year: Never true    Ran Out of Food in the Last Year: Never true  Transportation Needs: No Transportation Needs (09/09/2021)   PRAPARE - Hydrologist (Medical): No    Lack of Transportation (Non-Medical): No  Physical Activity: Inactive (09/09/2021)   Exercise Vital Sign    Days of Exercise per Week: 0 days    Minutes of Exercise per Session: 0 min  Stress: No Stress Concern Present (09/09/2021)   [place]    Feeling of Stress : Only a little  Social Connections: Socially  Isolated (09/09/2021)   Social Connection and Isolation Panel [NHANES]    Frequency of Communication with Friends and Family: More than three times a week    Frequency of Social Gatherings with Friends and Family: More than three times a week    Attends Religious Services: Never    Marine scientist or Organizations: No    Attends Archivist Meetings: Never    Marital Status: Never married  Intimate Partner Violence: Not At Risk (09/09/2021)   Humiliation, Afraid, Rape, and Kick questionnaire    Fear of Current or Ex-Partner: No    Emotionally Abused: No    Physically Abused: No    Sexually Abused: No     Current Outpatient Medications:    atorvastatin (LIPITOR) 40 MG tablet, Take 0.5 tablets (20 mg total) by mouth daily., Disp: 90  tablet, Rfl: 3   cilostazol (PLETAL) 100 MG tablet, Take 1 tablet (100 mg total) by mouth 2 (two) times daily., Disp: 60 tablet, Rfl: 1   diltiazem (CARDIZEM SR) 120 MG 12 hr capsule, Take 1 capsule (120 mg total) by mouth every 12 (twelve) hours., Disp: 180 capsule, Rfl: 3   FLUoxetine (PROZAC) 20 MG capsule, Take 2 capsules (40 mg total) by mouth daily., Disp: 60 capsule, Rfl: 0   furosemide (LASIX) 20 MG tablet, Take 1 tablet (20 mg total) by mouth daily., Disp: 90 tablet, Rfl: 3   isosorbide mononitrate (IMDUR) 60 MG 24 hr tablet, Take 60 mg by mouth daily., Disp: , Rfl:    lisinopril (ZESTRIL) 40 MG tablet, Take 1 tablet (40 mg total) by mouth daily., Disp: 90 tablet, Rfl: 3   metoprolol tartrate (LOPRESSOR) 25 MG tablet, Take 1 tablet (25 mg total) by mouth 2 (two) times daily., Disp: 180 tablet, Rfl: 3   pantoprazole (PROTONIX) 40 MG tablet, Take 1 tablet (40 mg total) by mouth daily., Disp: 90 tablet, Rfl: 3   potassium chloride SA (KLOR-CON M) 20 MEQ tablet, Take 1 tablet (20 mEq total) by mouth 2 (two) times daily., Disp: 180 tablet, Rfl: 3   warfarin (COUMADIN) 4 MG tablet, Take 1 tablet (4 mg total) by mouth daily at 4 PM., Disp: 30 tablet, Rfl: 0   No Known Allergies  ROS Review of Systems  Constitutional: Negative.   HENT: Negative.    Eyes: Negative.   Respiratory: Negative.    Cardiovascular: Negative.   Gastrointestinal: Negative.   Endocrine: Negative.   Genitourinary: Negative.   Musculoskeletal: Negative.   Skin: Negative.   Allergic/Immunologic: Negative.   Neurological: Negative.   Hematological: Negative.   Psychiatric/Behavioral: Negative.    All other systems reviewed and are negative.     Objective:    Physical Exam Vitals reviewed.  Constitutional:      Appearance: Normal appearance.  HENT:     Mouth/Throat:     Mouth: Mucous membranes are moist.  Eyes:     Pupils: Pupils are equal, round, and reactive to light.  Neck:     Vascular: No carotid  bruit.  Cardiovascular:     Rate and Rhythm: Normal rate and regular rhythm.     Pulses: Normal pulses.     Heart sounds: Normal heart sounds.  Pulmonary:     Effort: Pulmonary effort is normal.     Breath sounds: Normal breath sounds.  Abdominal:     General: Bowel sounds are normal.     Palpations: Abdomen is soft. There is no hepatomegaly, splenomegaly or mass.  Tenderness: There is no abdominal tenderness.     Hernia: No hernia is present.  Musculoskeletal:     Cervical back: Neck supple.     Right lower leg: No edema.     Left lower leg: No edema.  Skin:    Findings: No rash.  Neurological:     Mental Status: He is alert and oriented to person, place, and time.     Motor: No weakness.  Psychiatric:        Mood and Affect: Mood normal.        Behavior: Behavior normal.     BP (!) 123/51   Pulse (!) 55   Ht 6\' 1"  (1.854 m)   Wt 233 lb 3.2 oz (105.8 kg)   BMI 30.77 kg/m  Wt Readings from Last 3 Encounters:  09/28/21 233 lb 3.2 oz (105.8 kg)  09/20/21 238 lb 1.6 oz (108 kg)  09/06/21 237 lb (107.5 kg)     Health Maintenance Due  Topic Date Due   Hepatitis C Screening  Never done   COLONOSCOPY (Pts 45-54yrs Insurance coverage will need to be confirmed)  Never done   Zoster Vaccines- Shingrix (1 of 2) Never done   Pneumonia Vaccine 74+ Years old (2 - PCV) 05/14/2016   COVID-19 Vaccine (2 - Moderna series) 11/24/2019   INFLUENZA VACCINE  09/20/2021    There are no preventive care reminders to display for this patient.  Lab Results  Component Value Date   TSH 1.78 12/19/2019   Lab Results  Component Value Date   WBC 7.1 09/26/2021   HGB 12.6 (L) 09/26/2021   HCT 37.1 (L) 09/26/2021   MCV 100.5 (H) 09/26/2021   PLT 178 09/26/2021   Lab Results  Component Value Date   NA 137 09/24/2021   K 4.0 09/24/2021   CO2 21 (L) 09/24/2021   GLUCOSE 119 (H) 09/24/2021   BUN 17 09/24/2021   CREATININE 0.96 09/24/2021   BILITOT 0.5 12/19/2019   ALKPHOS 71  02/16/2017   AST 13 12/19/2019   ALT 20 12/19/2019   PROT 7.4 12/19/2019   ALBUMIN 3.7 02/16/2017   CALCIUM 8.6 (L) 09/24/2021   ANIONGAP 6 09/24/2021   Lab Results  Component Value Date   CHOL 235 (H) 12/19/2019   Lab Results  Component Value Date   HDL 46 12/19/2019   Lab Results  Component Value Date   LDLCALC 160 (H) 12/19/2019   Lab Results  Component Value Date   TRIG 155 (H) 12/19/2019   Lab Results  Component Value Date   CHOLHDL 5.1 (H) 12/19/2019   Lab Results  Component Value Date   HGBA1C 5.5 05/24/2015      Assessment & Plan:   Problem List Items Addressed This Visit       Cardiovascular and Mediastinum   Essential hypertension    Blood pressure is under control on medication      PAD (peripheral artery disease) ([place]) - Primary    Patient has been started on Coumadin in the hospital we will recheck a pro time.  He complains of weakness of the right knee and leg.  Denies any chest pain or shortness of breath.        Respiratory   Chronic obstructive pulmonary disease (COPD) ([place])    I told the patient to stop smoking completely        Digestive   GERD without esophagitis    - The patient's GERD is stable on medication.  -  Instructed the patient to avoid eating spicy and acidic foods, as well as foods high in fat. - Instructed the patient to avoid eating large meals or meals 2-3 hours prior to sleeping.        Other   Metabolic syndrome    Patient was advised to continue working to lose weight.      Dyslipidemia    Continue statin therapy     Pro time was checked today and it was found to be 2.7. He needs to have pro time checked once a month.  Need to walk on a daily basis to gain some strength in the right leg. Need to stop smoking admits possible and try to lose weight as much possible.  No orders of the defined types were placed in this encounter.   Follow-up: No follow-ups on file.    Cletis Athens, MD

## 2021-09-28 NOTE — Assessment & Plan Note (Signed)
Continue statin therapy.

## 2021-09-28 NOTE — Assessment & Plan Note (Signed)
Patient was advised to continue working to lose weight.

## 2021-09-28 NOTE — Assessment & Plan Note (Signed)
-   The patient's GERD is stable on medication.  - Instructed the patient to avoid eating spicy and acidic foods, as well as foods high in fat. - Instructed the patient to avoid eating large meals or meals 2-3 hours prior to sleeping. 

## 2021-09-28 NOTE — Assessment & Plan Note (Signed)
Blood pressure is under control on medication

## 2021-10-11 DIAGNOSIS — I6389 Other cerebral infarction: Secondary | ICD-10-CM | POA: Diagnosis not present

## 2021-10-11 DIAGNOSIS — I340 Nonrheumatic mitral (valve) insufficiency: Secondary | ICD-10-CM | POA: Diagnosis not present

## 2021-10-11 DIAGNOSIS — E669 Obesity, unspecified: Secondary | ICD-10-CM | POA: Diagnosis not present

## 2021-10-11 DIAGNOSIS — I10 Essential (primary) hypertension: Secondary | ICD-10-CM | POA: Diagnosis not present

## 2021-10-11 DIAGNOSIS — F17200 Nicotine dependence, unspecified, uncomplicated: Secondary | ICD-10-CM | POA: Diagnosis not present

## 2021-10-11 DIAGNOSIS — J449 Chronic obstructive pulmonary disease, unspecified: Secondary | ICD-10-CM | POA: Diagnosis not present

## 2021-10-11 DIAGNOSIS — R0602 Shortness of breath: Secondary | ICD-10-CM | POA: Diagnosis not present

## 2021-10-11 DIAGNOSIS — I509 Heart failure, unspecified: Secondary | ICD-10-CM | POA: Diagnosis not present

## 2021-10-11 DIAGNOSIS — E782 Mixed hyperlipidemia: Secondary | ICD-10-CM | POA: Diagnosis not present

## 2021-10-12 ENCOUNTER — Ambulatory Visit (INDEPENDENT_AMBULATORY_CARE_PROVIDER_SITE_OTHER): Payer: No Typology Code available for payment source | Admitting: Internal Medicine

## 2021-10-12 ENCOUNTER — Encounter: Payer: Self-pay | Admitting: Internal Medicine

## 2021-10-12 VITALS — BP 160/86 | HR 61 | Ht 73.0 in | Wt 226.7 lb

## 2021-10-12 DIAGNOSIS — I739 Peripheral vascular disease, unspecified: Secondary | ICD-10-CM | POA: Diagnosis not present

## 2021-10-12 DIAGNOSIS — E785 Hyperlipidemia, unspecified: Secondary | ICD-10-CM | POA: Diagnosis not present

## 2021-10-12 DIAGNOSIS — I6340 Cerebral infarction due to embolism of unspecified cerebral artery: Secondary | ICD-10-CM | POA: Diagnosis not present

## 2021-10-12 DIAGNOSIS — J449 Chronic obstructive pulmonary disease, unspecified: Secondary | ICD-10-CM | POA: Diagnosis not present

## 2021-10-12 DIAGNOSIS — I4892 Unspecified atrial flutter: Secondary | ICD-10-CM | POA: Diagnosis not present

## 2021-10-12 DIAGNOSIS — I10 Essential (primary) hypertension: Secondary | ICD-10-CM

## 2021-10-12 MED ORDER — APIXABAN 5 MG PO TABS: 5.0000 mg | ORAL_TABLET | Freq: Two times a day (BID) | ORAL | 4 refills | Status: DC

## 2021-10-12 NOTE — Assessment & Plan Note (Signed)
Blood pressure borderline elevated, patient was advised to follow DASH Diet: Care Instructions Your Care Instructions  The DASH diet is an eating plan that can help lower your blood pressure. DASH stands for Dietary Approaches to Stop Hypertension. Hypertension is high blood pressure. The DASH diet focuses on eating foods that are high in calcium, potassium, and magnesium. These nutrients can lower blood pressure. The foods that are highest in these nutrients are fruits, vegetables, low-fat dairy products, nuts, seeds, and legumes. But taking calcium, potassium, and magnesium supplements instead of eating foods that are high in those nutrients does not have the same effect. The DASH diet also includes whole grains, fish, and poultry. The DASH diet is one of several lifestyle changes your doctor may recommend to lower your high blood pressure. Your doctor may also want you to decrease the amount of sodium in your diet. Lowering sodium while following the DASH diet can lower blood pressure even further than just the DASH diet alone. Follow-up care is a key part of your treatment and safety. Be sure to make and go to all appointments, and call your doctor if you are having problems. It's also a good idea to know your test results and keep a list of the medicines you take. How can you care for yourself at home? Following the DASH diet  Eat 4 to 5 servings of fruit each day. A serving is 1 medium-sized piece of fruit,  cup chopped or canned fruit, 1/4 cup dried fruit, or 4 ounces ( cup) of fruit juice. Choose fruit more often than fruit juice.  Eat 4 to 5 servings of vegetables each day. A serving is 1 cup of lettuce or raw leafy vegetables,  cup of chopped or cooked vegetables, or 4 ounces ( cup) of vegetable juice. Choose vegetables more often than vegetable juice.  Get 2 to 3 servings of low-fat and fat-free dairy each day. A serving is 8 ounces of milk, 1 cup of yogurt, or 1  ounces of cheese.   Eat 6 to 8 servings of grains each day. A serving is 1 slice of bread, 1 ounce of dry cereal, or  cup of cooked rice, pasta, or cooked cereal. Try to choose whole-grain products as much as possible.  Limit lean meat, poultry, and fish to 2 servings each day. A serving is 3 ounces, about the size of a deck of cards.  Eat 4 to 5 servings of nuts, seeds, and legumes (cooked dried beans, lentils, and split peas) each week. A serving is 1/3 cup of nuts, 2 tablespoons of seeds, or  cup of cooked beans or peas.  Limit fats and oils to 2 to 3 servings each day. A serving is 1 teaspoon of vegetable oil or 2 tablespoons of salad dressing.  Limit sweets and added sugars to 5 servings or less a week. A serving is 1 tablespoon jelly or jam,  cup sorbet, or 1 cup of lemonade.  Eat less than 2,300 milligrams (mg) of sodium a day. If you limit your sodium to 1,500 mg a day, you can lower your blood pressure even more. Tips for success  Start small. Do not try to make dramatic changes to your diet all at once. You might feel that you are missing out on your favorite foods and then be more likely to not follow the plan. Make small changes, and stick with them. Once those changes become habit, add a few more changes.  Try some of the following: ?  Make it a goal to eat a fruit or vegetable at every meal and at snacks. This will make it easy to get the recommended amount of fruits and vegetables each day. ? Try yogurt topped with fruit and nuts for a snack or healthy dessert. ? Add lettuce, tomato, cucumber, and onion to sandwiches. ? Combine a ready-made pizza crust with low-fat mozzarella cheese and lots of vegetable toppings. Try using tomatoes, squash, spinach, broccoli, carrots, cauliflower, and onions. ? Have a variety of cut-up vegetables with a low-fat dip as an appetizer instead of chips and dip. ? Sprinkle sunflower seeds or chopped almonds over salads. Or try adding chopped walnuts or almonds to cooked  vegetables. ? Try some vegetarian meals using beans and peas. Add garbanzo or kidney beans to salads. Make burritos and tacos with mashed pinto beans or black beans.

## 2021-10-12 NOTE — Assessment & Plan Note (Signed)
Stable

## 2021-10-12 NOTE — Assessment & Plan Note (Signed)
Patient is on Eliquis, patient is getting physical therapy

## 2021-10-12 NOTE — Addendum Note (Signed)
Addended by: Alois Cliche on: 10/12/2021 11:58 AM   Modules accepted: Orders

## 2021-10-12 NOTE — Assessment & Plan Note (Signed)
Patient was advised to quit smoking 

## 2021-10-12 NOTE — Progress Notes (Signed)
Established Patient Office Visit  Subjective:  Patient ID: Gregory Lane, male    DOB: January 17, 1951  Age: 71 y.o. MRN: [id number]  CC:  Chief Complaint  Patient presents with   Hospitalization Follow-up    Patient was admitted on 09/21/21 and discharged on 09/26/21 after stroke.  Patient still has weakness of right side and it taking physical therapy.     HPI  Gregory Lane presents for check up, patient has a history of chronic kidney disease ongoing tobacco abuse he smokes half a pack per day he has a history of CVA and atrial flutter.  He is on Eliquis.  His other problems include history of dyslipidemia COPD osteoarthritis.  He has a facial asymmetry suggestive of stroke or trouble getting up from the chair.  Denies any chest pain or continue to complain of fluttering of the heart.  His blood pressure today is 160/86.  Past Medical History:  Diagnosis Date   Aortic atherosclerosis ([place]) 02/16/2017   Arthritis    Blind left eye    Hypertension    Low TSH level 02/17/2017   Myocardial infarction [facility])    Perforated duodenal ulcer ([place])    Stroke [facility])     Past Surgical History:  Procedure Laterality Date   EXPLORATORY LAPAROTOMY  05/12/2015   EYE SURGERY     FRACTURE SURGERY     LAPAROTOMY N/A 05/10/2015   Procedure: EXPLORATORY LAPAROTOMY;  Surgeon: Ralene Ok, MD;  Location: [place];  Service: General;  Laterality: N/A;   LEFT HEART CATH AND CORONARY ANGIOGRAPHY N/A 05/02/2016   Procedure: Left Heart Cath and Coronary Angiography;  Surgeon: Adrian Prows, MD;  Location: [place] CV [facility];  Service: Cardiovascular;  Laterality: N/A;   LOWER EXTREMITY ANGIOGRAPHY Left 09/21/2021   Procedure: Lower Extremity Angiography;  Surgeon: Katha Cabal, MD;  Location: [place] [facility];  Service: Cardiovascular;  Laterality: Left;    Family History  Problem Relation Age of Onset   Cirrhosis Father    Hypertension Mother    Lung cancer Sister    Hypertension Sister     Arthritis Sister    Heart murmur Sister     Social History   Socioeconomic History   Marital status: Single    Spouse name: Not on file   Number of children: Not on file   Years of education: Not on file   Highest education level: Not on file  Occupational History   Not on file  Tobacco Use   Smoking status: Every Day    Packs/day: 1.00    Years: 60.00    Total pack years: 60.00    Types: Cigarettes   Smokeless tobacco: Never  Substance and Sexual Activity   Alcohol use: No    Alcohol/week: 60.0 standard drinks of alcohol    Types: 60 Standard drinks or equivalent per week    Comment: daily 12 pk of beer   Drug use: No   Sexual activity: Not Currently  Other Topics Concern   Not on file  Social History Narrative   [place] Veteran. Lives alone   Social Determinants of Health   Financial Resource Strain: Low Risk  (09/09/2021)   Overall Financial Resource Strain (CARDIA)    Difficulty of Paying Living Expenses: Not very hard  Food Insecurity: No Food Insecurity (09/09/2021)   Hunger Vital Sign    Worried About Running Out of Food in the Last Year: Never true    Ran Out of Food in the  Last Year: Never true  Transportation Needs: No Transportation Needs (09/09/2021)   PRAPARE - Hydrologist (Medical): No    Lack of Transportation (Non-Medical): No  Physical Activity: Inactive (09/09/2021)   Exercise Vital Sign    Days of Exercise per Week: 0 days    Minutes of Exercise per Session: 0 min  Stress: No Stress Concern Present (09/09/2021)   [place]    Feeling of Stress : Only a little  Social Connections: Socially Isolated (09/09/2021)   Social Connection and Isolation Panel [NHANES]    Frequency of Communication with Friends and Family: More than three times a week    Frequency of Social Gatherings with Friends and Family: More than three times a week    Attends Religious  Services: Never    Marine scientist or Organizations: No    Attends Archivist Meetings: Never    Marital Status: Never married  Intimate Partner Violence: Not At Risk (09/09/2021)   Humiliation, Afraid, Rape, and Kick questionnaire    Fear of Current or Ex-Partner: No    Emotionally Abused: No    Physically Abused: No    Sexually Abused: No     Current Outpatient Medications:    Aromatic Inhalants (DECONGESTANT INHALER IN), Inhale into the lungs., Disp: , Rfl:    atorvastatin (LIPITOR) 40 MG tablet, Take 0.5 tablets (20 mg total) by mouth daily., Disp: 90 tablet, Rfl: 3   cilostazol (PLETAL) 100 MG tablet, Take 1 tablet (100 mg total) by mouth 2 (two) times daily., Disp: 60 tablet, Rfl: 1   diltiazem (CARDIZEM SR) 120 MG 12 hr capsule, Take 1 capsule (120 mg total) by mouth every 12 (twelve) hours., Disp: 180 capsule, Rfl: 3   FLUoxetine (PROZAC) 20 MG capsule, Take 2 capsules (40 mg total) by mouth daily., Disp: 60 capsule, Rfl: 0   furosemide (LASIX) 20 MG tablet, Take 1 tablet (20 mg total) by mouth daily., Disp: 90 tablet, Rfl: 3   isosorbide mononitrate (IMDUR) 60 MG 24 hr tablet, Take 60 mg by mouth daily., Disp: , Rfl:    lisinopril (ZESTRIL) 40 MG tablet, Take 1 tablet (40 mg total) by mouth daily., Disp: 90 tablet, Rfl: 3   metoprolol tartrate (LOPRESSOR) 25 MG tablet, Take 1 tablet (25 mg total) by mouth 2 (two) times daily., Disp: 180 tablet, Rfl: 3   pantoprazole (PROTONIX) 40 MG tablet, Take 1 tablet (40 mg total) by mouth daily., Disp: 90 tablet, Rfl: 3   potassium chloride SA (KLOR-CON M) 20 MEQ tablet, Take 1 tablet (20 mEq total) by mouth 2 (two) times daily., Disp: 180 tablet, Rfl: 3   warfarin (COUMADIN) 4 MG tablet, Take 1 tablet (4 mg total) by mouth daily at 4 PM., Disp: 30 tablet, Rfl: 0   No Known Allergies  ROS Review of Systems  Constitutional:  Negative for appetite change.  HENT:  Positive for congestion. Negative for nosebleeds.   Eyes:   Negative for redness.  Respiratory:  Positive for cough and shortness of breath. Negative for chest tightness.   Cardiovascular:  Positive for palpitations. Negative for chest pain and leg swelling.  Gastrointestinal:  Negative for abdominal distention.  Neurological:  Negative for speech difficulty.  Psychiatric/Behavioral:  Negative for confusion.       Objective:    Physical Exam Cardiovascular:     Rate and Rhythm: Rhythm irregular.     Heart sounds: No  murmur heard. Pulmonary:     Breath sounds: Wheezing present.  Abdominal:     General: Bowel sounds are normal.     Palpations: Abdomen is soft.  Neurological:     Comments: Trouble getting up from the chair #2 patient is frustrated with the left-sided patient weakness #3 speech is okay, patient is getting physical therapy     BP (!) 160/86   Pulse 61   Ht 6\' 1"  (1.854 m)   Wt 226 lb 11.2 oz (102.8 kg)   BMI 29.91 kg/m  Wt Readings from Last 3 Encounters:  10/12/21 226 lb 11.2 oz (102.8 kg)  09/28/21 233 lb 3.2 oz (105.8 kg)  09/20/21 238 lb 1.6 oz (108 kg)     Health Maintenance Due  Topic Date Due   Hepatitis C Screening  Never done   COLONOSCOPY (Pts 45-83yrs Insurance coverage will need to be confirmed)  Never done   Zoster Vaccines- Shingrix (1 of 2) Never done   Pneumonia Vaccine 60+ Years old (2 - PCV) 05/14/2016   COVID-19 Vaccine (2 - Moderna series) 11/24/2019   INFLUENZA VACCINE  09/20/2021    There are no preventive care reminders to display for this patient.  Lab Results  Component Value Date   TSH 1.78 12/19/2019   Lab Results  Component Value Date   WBC 7.1 09/26/2021   HGB 12.6 (L) 09/26/2021   HCT 37.1 (L) 09/26/2021   MCV 100.5 (H) 09/26/2021   PLT 178 09/26/2021   Lab Results  Component Value Date   NA 137 09/24/2021   K 4.0 09/24/2021   CO2 21 (L) 09/24/2021   GLUCOSE 119 (H) 09/24/2021   BUN 17 09/24/2021   CREATININE 0.96 09/24/2021   BILITOT 0.5 12/19/2019   ALKPHOS 71  02/16/2017   AST 13 12/19/2019   ALT 20 12/19/2019   PROT 7.4 12/19/2019   ALBUMIN 3.7 02/16/2017   CALCIUM 8.6 (L) 09/24/2021   ANIONGAP 6 09/24/2021   Lab Results  Component Value Date   CHOL 235 (H) 12/19/2019   Lab Results  Component Value Date   HDL 46 12/19/2019   Lab Results  Component Value Date   LDLCALC 160 (H) 12/19/2019   Lab Results  Component Value Date   TRIG 155 (H) 12/19/2019   Lab Results  Component Value Date   CHOLHDL 5.1 (H) 12/19/2019   Lab Results  Component Value Date   HGBA1C 5.5 05/24/2015      Assessment & Plan:   Problem List Items Addressed This Visit       Cardiovascular and Mediastinum   Essential hypertension    Blood pressure borderline elevated, patient was advised to follow DASH Diet: Care Instructions Your Care Instructions  The DASH diet is an eating plan that can help lower your blood pressure. DASH stands for Dietary Approaches to Stop Hypertension. Hypertension is high blood pressure. The DASH diet focuses on eating foods that are high in calcium, potassium, and magnesium. These nutrients can lower blood pressure. The foods that are highest in these nutrients are fruits, vegetables, low-fat dairy products, nuts, seeds, and legumes. But taking calcium, potassium, and magnesium supplements instead of eating foods that are high in those nutrients does not have the same effect. The DASH diet also includes whole grains, fish, and poultry. The DASH diet is one of several lifestyle changes your doctor may recommend to lower your high blood pressure. Your doctor may also want you to decrease the amount of sodium in  your diet. Lowering sodium while following the DASH diet can lower blood pressure even further than just the DASH diet alone. Follow-up care is a key part of your treatment and safety. Be sure to make and go to all appointments, and call your doctor if you are having problems. It's also a good idea to know your test results and  keep a list of the medicines you take. How can you care for yourself at home? Following the DASH diet  Eat 4 to 5 servings of fruit each day. A serving is 1 medium-sized piece of fruit,  cup chopped or canned fruit, 1/4 cup dried fruit, or 4 ounces ( cup) of fruit juice. Choose fruit more often than fruit juice.  Eat 4 to 5 servings of vegetables each day. A serving is 1 cup of lettuce or raw leafy vegetables,  cup of chopped or cooked vegetables, or 4 ounces ( cup) of vegetable juice. Choose vegetables more often than vegetable juice.  Get 2 to 3 servings of low-fat and fat-free dairy each day. A serving is 8 ounces of milk, 1 cup of yogurt, or 1  ounces of cheese.  Eat 6 to 8 servings of grains each day. A serving is 1 slice of bread, 1 ounce of dry cereal, or  cup of cooked rice, pasta, or cooked cereal. Try to choose whole-grain products as much as possible.  Limit lean meat, poultry, and fish to 2 servings each day. A serving is 3 ounces, about the size of a deck of cards.  Eat 4 to 5 servings of nuts, seeds, and legumes (cooked dried beans, lentils, and split peas) each week. A serving is 1/3 cup of nuts, 2 tablespoons of seeds, or  cup of cooked beans or peas.  Limit fats and oils to 2 to 3 servings each day. A serving is 1 teaspoon of vegetable oil or 2 tablespoons of salad dressing.  Limit sweets and added sugars to 5 servings or less a week. A serving is 1 tablespoon jelly or jam,  cup sorbet, or 1 cup of lemonade.  Eat less than 2,300 milligrams (mg) of sodium a day. If you limit your sodium to 1,500 mg a day, you can lower your blood pressure even more. Tips for success  Start small. Do not try to make dramatic changes to your diet all at once. You might feel that you are missing out on your favorite foods and then be more likely to not follow the plan. Make small changes, and stick with them. Once those changes become habit, add a few more changes.  Try some of the  following: ? Make it a goal to eat a fruit or vegetable at every meal and at snacks. This will make it easy to get the recommended amount of fruits and vegetables each day. ? Try yogurt topped with fruit and nuts for a snack or healthy dessert. ? Add lettuce, tomato, cucumber, and onion to sandwiches. ? Combine a ready-made pizza crust with low-fat mozzarella cheese and lots of vegetable toppings. Try using tomatoes, squash, spinach, broccoli, carrots, cauliflower, and onions. ? Have a variety of cut-up vegetables with a low-fat dip as an appetizer instead of chips and dip. ? Sprinkle sunflower seeds or chopped almonds over salads. Or try adding chopped walnuts or almonds to cooked vegetables. ? Try some vegetarian meals using beans and peas. Add garbanzo or kidney beans to salads. Make burritos and tacos with mashed pinto beans or black beans.  Cerebrovascular accident (CVA) due to embolism of cerebral artery [facility])    Patient is on Eliquis, patient is getting physical therapy         PAD (peripheral artery disease) ([place])    Stable      Atrial flutter ([place]) - Primary    Stable at the present time        Respiratory   Chronic obstructive pulmonary disease (COPD) ([place])    Patient was advised to quit smoking      Relevant Medications   Aromatic Inhalants (DECONGESTANT INHALER IN)     Other   Hyperlipidemia    No orders of the defined types were placed in this encounter.   Follow-up: No follow-ups on file.    Cletis Athens, MD

## 2021-10-12 NOTE — Assessment & Plan Note (Signed)
Stable at the present time. 

## 2021-10-13 ENCOUNTER — Ambulatory Visit (INDEPENDENT_AMBULATORY_CARE_PROVIDER_SITE_OTHER): Payer: No Typology Code available for payment source | Admitting: Nurse Practitioner

## 2021-10-13 ENCOUNTER — Encounter (INDEPENDENT_AMBULATORY_CARE_PROVIDER_SITE_OTHER): Payer: Self-pay | Admitting: Nurse Practitioner

## 2021-10-13 VITALS — BP 162/75 | HR 73 | Resp 16 | Wt 226.0 lb

## 2021-10-13 DIAGNOSIS — E785 Hyperlipidemia, unspecified: Secondary | ICD-10-CM

## 2021-10-13 DIAGNOSIS — I739 Peripheral vascular disease, unspecified: Secondary | ICD-10-CM | POA: Diagnosis not present

## 2021-10-13 DIAGNOSIS — R200 Anesthesia of skin: Secondary | ICD-10-CM

## 2021-10-13 DIAGNOSIS — M8580 Other specified disorders of bone density and structure, unspecified site: Secondary | ICD-10-CM | POA: Insufficient documentation

## 2021-10-13 DIAGNOSIS — R202 Paresthesia of skin: Secondary | ICD-10-CM

## 2021-10-20 DIAGNOSIS — R0602 Shortness of breath: Secondary | ICD-10-CM | POA: Diagnosis not present

## 2021-10-28 DIAGNOSIS — N189 Chronic kidney disease, unspecified: Secondary | ICD-10-CM | POA: Diagnosis not present

## 2021-10-28 DIAGNOSIS — E669 Obesity, unspecified: Secondary | ICD-10-CM | POA: Diagnosis not present

## 2021-10-28 DIAGNOSIS — I6389 Other cerebral infarction: Secondary | ICD-10-CM | POA: Diagnosis not present

## 2021-10-28 DIAGNOSIS — I340 Nonrheumatic mitral (valve) insufficiency: Secondary | ICD-10-CM | POA: Diagnosis not present

## 2021-10-28 DIAGNOSIS — Z7901 Long term (current) use of anticoagulants: Secondary | ICD-10-CM | POA: Diagnosis not present

## 2021-10-28 DIAGNOSIS — I129 Hypertensive chronic kidney disease with stage 1 through stage 4 chronic kidney disease, or unspecified chronic kidney disease: Secondary | ICD-10-CM | POA: Diagnosis not present

## 2021-10-28 DIAGNOSIS — I4892 Unspecified atrial flutter: Secondary | ICD-10-CM | POA: Diagnosis not present

## 2021-10-28 DIAGNOSIS — I2510 Atherosclerotic heart disease of native coronary artery without angina pectoris: Secondary | ICD-10-CM | POA: Diagnosis not present

## 2021-10-28 DIAGNOSIS — I739 Peripheral vascular disease, unspecified: Secondary | ICD-10-CM | POA: Diagnosis not present

## 2021-10-28 DIAGNOSIS — E785 Hyperlipidemia, unspecified: Secondary | ICD-10-CM | POA: Diagnosis not present

## 2021-10-28 DIAGNOSIS — Z48812 Encounter for surgical aftercare following surgery on the circulatory system: Secondary | ICD-10-CM | POA: Diagnosis not present

## 2021-10-28 DIAGNOSIS — I69351 Hemiplegia and hemiparesis following cerebral infarction affecting right dominant side: Secondary | ICD-10-CM | POA: Diagnosis not present

## 2021-10-28 DIAGNOSIS — I70203 Unspecified atherosclerosis of native arteries of extremities, bilateral legs: Secondary | ICD-10-CM | POA: Diagnosis not present

## 2021-10-28 DIAGNOSIS — K219 Gastro-esophageal reflux disease without esophagitis: Secondary | ICD-10-CM | POA: Diagnosis not present

## 2021-10-28 DIAGNOSIS — I509 Heart failure, unspecified: Secondary | ICD-10-CM | POA: Diagnosis not present

## 2021-10-28 DIAGNOSIS — J449 Chronic obstructive pulmonary disease, unspecified: Secondary | ICD-10-CM | POA: Diagnosis not present

## 2021-10-28 DIAGNOSIS — F17200 Nicotine dependence, unspecified, uncomplicated: Secondary | ICD-10-CM | POA: Diagnosis not present

## 2021-10-31 ENCOUNTER — Emergency Department: Payer: No Typology Code available for payment source

## 2021-10-31 ENCOUNTER — Other Ambulatory Visit: Payer: Self-pay

## 2021-10-31 ENCOUNTER — Emergency Department
Admission: EM | Admit: 2021-10-31 | Discharge: 2021-10-31 | Disposition: A | Discharge status: Home / Self Care | Payer: No Typology Code available for payment source | Attending: Emergency Medicine | Admitting: Emergency Medicine

## 2021-10-31 DIAGNOSIS — J449 Chronic obstructive pulmonary disease, unspecified: Secondary | ICD-10-CM | POA: Insufficient documentation

## 2021-10-31 DIAGNOSIS — I10 Essential (primary) hypertension: Secondary | ICD-10-CM

## 2021-10-31 DIAGNOSIS — I2510 Atherosclerotic heart disease of native coronary artery without angina pectoris: Secondary | ICD-10-CM | POA: Insufficient documentation

## 2021-10-31 DIAGNOSIS — R001 Bradycardia, unspecified: Secondary | ICD-10-CM

## 2021-10-31 DIAGNOSIS — Z8673 Personal history of transient ischemic attack (TIA), and cerebral infarction without residual deficits: Secondary | ICD-10-CM | POA: Insufficient documentation

## 2021-10-31 LAB — CBC
HCT: 40.7 % (ref 39.0–52.0)
Hemoglobin: 13.8 g/dL (ref 13.0–17.0)
MCH: 34.3 pg — ABNORMAL HIGH (ref 26.0–34.0)
MCHC: 33.9 g/dL (ref 30.0–36.0)
MCV: 101.2 fL — ABNORMAL HIGH (ref 80.0–100.0)
Platelets: 195 10*3/uL (ref 150–400)
RBC: 4.02 MIL/uL — ABNORMAL LOW (ref 4.22–5.81)
RDW: 12.7 % (ref 11.5–15.5)
WBC: 5.7 10*3/uL (ref 4.0–10.5)
nRBC: 0 % (ref 0.0–0.2)

## 2021-10-31 LAB — BASIC METABOLIC PANEL
Anion gap: 8 (ref 5–15)
BUN: 14 mg/dL (ref 8–23)
CO2: 23 mmol/L (ref 22–32)
Calcium: 9 mg/dL (ref 8.9–10.3)
Chloride: 110 mmol/L (ref 98–111)
Creatinine, Ser: 0.83 mg/dL (ref 0.61–1.24)
GFR, Estimated: >60 mL/min (ref >60)
Glucose, Bld: 115 mg/dL — ABNORMAL HIGH (ref 70–99)
Potassium: 4.1 mmol/L (ref 3.5–5.1)
Sodium: 141 mmol/L (ref 135–145)

## 2021-10-31 LAB — TROPONIN I (HIGH SENSITIVITY): Troponin I (High Sensitivity): 7 ng/L (ref <18)

## 2021-10-31 MED ORDER — AMLODIPINE BESYLATE 5 MG PO TABS: 5.0000 mg | ORAL_TABLET | Freq: Every day | ORAL | 0 refills | Status: DC

## 2021-10-31 NOTE — Discharge Instructions (Addendum)
Your heart rate today was noted to be low.  You should stop your diltiazem as this could be contributing to it.  You will need to start amlodipine as a replacement to control your blood pressure.  You should check your blood pressure once in the morning and once in the evening, write down these numbers so that you can show your primary care doctor.  Please return to the ER for reevaluation if you develop any chest pain, shortness of breath, dizziness, lightheadedness, feeling like you are going to pass out, or any other worsening symptoms.

## 2021-10-31 NOTE — ED Provider Notes (Signed)
[facility] Provider Note    Event Date/Time   First MD Initiated Contact with Patient 10/31/21 1302     (approximate)   History   Chief Complaint Hypertension   HPI  Gregory Lane is a 71 y.o. male with past medical history of hypertension, CAD, atrial fibrillation on Eliquis, COPD, stroke, and peptic ulcer disease who presents to the ED complaining of hypertension.  Patient reports that his physical therapist was checking his blood pressure earlier today during a routine visit and found it to be elevated with systolic greater than 606.  Patient reports that he has been taking all of his medications as prescribed, however he is unsure of the names of any of his medications.  He has taken his morning dose of blood pressure medication already today, states he is not having any symptoms and currently feels well.  He specifically denies any headache, vision changes, numbness, weakness, chest pain, or shortness of breath.      Physical Exam   Triage Vital Signs: ED Triage Vitals  Enc Vitals Group     BP 10/31/21 1211 (!) 204/77     Pulse Rate 10/31/21 1211 (!) 51     Resp 10/31/21 1211 18     Temp 10/31/21 1211 99.1 F (37.3 C)     Temp Source 10/31/21 1211 Oral     SpO2 10/31/21 1211 93 %     Weight 10/31/21 1211 225 lb (102.1 kg)     Height 10/31/21 1211 6\' 1"  (1.854 m)     Head Circumference --      Peak Flow --      Pain Score 10/31/21 1214 0     Pain Loc --      Pain Edu? --      Excl. in [place]? --     Most recent vital signs: Vitals:   10/31/21 1211 10/31/21 1319  BP: (!) 204/77 (!) 172/74  Pulse: (!) 51 (!) 51  Resp: 18 19  Temp: 99.1 F (37.3 C)   SpO2: 93% 94%    Constitutional: Alert and oriented. Eyes: Conjunctivae are normal. Head: Atraumatic. Nose: No congestion/rhinnorhea. Mouth/Throat: Mucous membranes are moist.  Cardiovascular: Bradycardic, regular rhythm. Grossly normal heart sounds.  2+ radial pulses  bilaterally. Respiratory: Normal respiratory effort.  No retractions. Lungs CTAB. Gastrointestinal: Soft and nontender. No distention. Musculoskeletal: No lower extremity tenderness nor edema.  Neurologic:  Normal speech and language. No gross focal neurologic deficits are appreciated.    ED Results / Procedures / Treatments   Labs (all labs ordered are listed, but only abnormal results are displayed) Labs Reviewed  BASIC METABOLIC PANEL - Abnormal; Notable for the following components:      Result Value   Glucose, Bld 115 (*)    All other components within normal limits  CBC - Abnormal; Notable for the following components:   RBC 4.02 (*)    MCV 101.2 (*)    MCH 34.3 (*)    All other components within normal limits  TROPONIN I (HIGH SENSITIVITY)  TROPONIN I (HIGH SENSITIVITY)     EKG  ED ECG REPORT I, Blake Divine, the attending physician, personally viewed and interpreted this ECG.   Date: 10/31/2021  EKG Time: 13:22  Rate: 43  Rhythm: sinus bradycardia  Axis: Normal  Intervals:none  ST&T Change: None  RADIOLOGY Chest x-ray reviewed and interpreted by me with no infiltrate, edema, or effusion.  PROCEDURES:  Critical Care performed: No  Procedures  MEDICATIONS ORDERED IN ED: Medications - No data to display   IMPRESSION / MDM / [place] / ED COURSE  I reviewed the triage vital signs and the nursing notes.                              71 y.o. male with past medical history of hypertension, CAD, atrial fibrillation on Eliquis, COPD, stroke, and peptic ulcer disease who presents to the ED complaining of elevated blood pressure noted by his physical therapist earlier today, currently denies any symptoms.  Patient's presentation is most consistent with exacerbation of chronic illness.  Differential diagnosis includes, but is not limited to, asymptomatic hypertension, stroke, TIA, dissection, ACS, AKI, electrolyte abnormality, medication  noncompliance.  Patient nontoxic-appearing and in no acute distress, vital signs remarkable for bradycardia as well as elevated blood pressure.  Patient currently denies any symptoms and has a nonfocal neurologic exam.  EKG shows sinus bradycardia with no ischemic changes, labs are reassuring with no significant anemia, leukocytosis, electrolyte abnormality, or AKI.  Troponin within normal limits and there is no evidence of hypertensive emergency at this time.  It appears he is taking both diltiazem and metoprolol for rate control of his atrial fibrillation, given significant bradycardia we will stop his diltiazem.  He will also likely need additional medication for blood pressure control and we will start him on amlodipine.  He was counseled to check his blood pressure twice daily and to follow-up with his PCP in about 1 week for recheck of blood pressure and heart rate.  He was counseled to return to the ED for new or worsening symptoms, patient agrees with plan.      FINAL CLINICAL IMPRESSION(S) / ED DIAGNOSES   Final diagnoses:  Primary hypertension  Bradycardia     Rx / DC Orders   ED Discharge Orders          Ordered    amLODipine (NORVASC) 5 MG tablet  Daily        10/31/21 1408             Note:  This document was prepared using Dragon voice recognition software and may include unintentional dictation errors.   Blake Divine, MD 10/31/21 [id number]

## 2021-10-31 NOTE — ED Triage Notes (Signed)
Pt arrives with c/o HTN. Pt has hx of HTN and has been taking BP meds. Pt denies CP, SOB, or dizziness.

## 2021-11-03 DIAGNOSIS — N189 Chronic kidney disease, unspecified: Secondary | ICD-10-CM | POA: Diagnosis not present

## 2021-11-03 DIAGNOSIS — I129 Hypertensive chronic kidney disease with stage 1 through stage 4 chronic kidney disease, or unspecified chronic kidney disease: Secondary | ICD-10-CM | POA: Diagnosis not present

## 2021-11-03 DIAGNOSIS — Z48812 Encounter for surgical aftercare following surgery on the circulatory system: Secondary | ICD-10-CM | POA: Diagnosis not present

## 2021-11-03 DIAGNOSIS — I739 Peripheral vascular disease, unspecified: Secondary | ICD-10-CM | POA: Diagnosis not present

## 2021-11-03 DIAGNOSIS — E785 Hyperlipidemia, unspecified: Secondary | ICD-10-CM | POA: Diagnosis not present

## 2021-11-03 DIAGNOSIS — J449 Chronic obstructive pulmonary disease, unspecified: Secondary | ICD-10-CM | POA: Diagnosis not present

## 2021-11-03 DIAGNOSIS — I69351 Hemiplegia and hemiparesis following cerebral infarction affecting right dominant side: Secondary | ICD-10-CM | POA: Diagnosis not present

## 2021-11-03 DIAGNOSIS — Z7901 Long term (current) use of anticoagulants: Secondary | ICD-10-CM | POA: Diagnosis not present

## 2021-11-03 DIAGNOSIS — I70203 Unspecified atherosclerosis of native arteries of extremities, bilateral legs: Secondary | ICD-10-CM | POA: Diagnosis not present

## 2021-11-03 DIAGNOSIS — I4892 Unspecified atrial flutter: Secondary | ICD-10-CM | POA: Diagnosis not present

## 2021-11-06 ENCOUNTER — Encounter (INDEPENDENT_AMBULATORY_CARE_PROVIDER_SITE_OTHER): Payer: Self-pay | Admitting: Nurse Practitioner

## 2021-11-06 NOTE — Progress Notes (Signed)
Subjective:    Patient ID: Gregory Lane, male    DOB: 1950-11-26, 71 y.o.   MRN: [id number] Chief Complaint  Patient presents with   Follow-up    [facility] 2 week follow up    The patient returns to the office for followup and review status post angiogram with intervention on 09/21/2021.   Procedure: Procedure(s) Performed:             1.  Abdominal aortogram             2.  Left lower extremity distal runoff third order catheter placement with additional third order             3.  Mechanical thrombectomy left SFA with the penumbra CAT 7 device             4.  Mechanical thrombectomy of the left profunda femoris with the penumbra CAT 7 device             5.  Mechanical thrombectomy of the distal left common femoral artery with the penumbra CAT 7 device             6.  [employer] right common femoral artery  The patient notes improvement in the lower extremity symptoms. No interval shortening of the patient's claudication distance or rest pain symptoms. No new ulcers or wounds have occurred since the last visit.  The patient does note that he has had some right upper extremity numbness and tingling in the is concerned that this could be an indication for follow-up He was in his lower extremities.  There have been no significant changes to the patient's overall health care.  No documented history of amaurosis fugax or recent TIA symptoms. There are no recent neurological changes noted. No documented history of DVT, PE or superficial thrombophlebitis. The patient denies recent episodes of angina or shortness of breath.       Review of Systems  All other systems reviewed and are negative.      Objective:   Physical Exam Vitals reviewed.  HENT:     Head: Normocephalic.  Cardiovascular:     Rate and Rhythm: Normal rate.     Pulses: Normal pulses.  Pulmonary:     Effort: Pulmonary effort is normal.  Skin:    General: Skin is warm and dry.  Neurological:     Mental Status:  He is alert and oriented to person, place, and time.  Psychiatric:        Mood and Affect: Mood normal.        Behavior: Behavior normal.        Thought Content: Thought content normal.        Judgment: Judgment normal.     BP (!) 162/75 (BP Location: Left Arm)   Pulse 73   Resp 16   Wt 226 lb (102.5 kg)   BMI 29.82 kg/m   Past Medical History:  Diagnosis Date   Aortic atherosclerosis ([place]) 02/16/2017   Arthritis    Blind left eye    Hypertension    Low TSH level 02/17/2017   Myocardial infarction ([place])    Perforated duodenal ulcer ([place])    Stroke [facility])     Social History   Socioeconomic History   Marital status: Single    Spouse name: Not on file   Number of children: Not on file   Years of education: Not on file   Highest education level: Not on file  Occupational History  Not on file  Tobacco Use   Smoking status: Every Day    Packs/day: 1.00    Years: 60.00    Total pack years: 60.00    Types: Cigarettes   Smokeless tobacco: Never  Substance and Sexual Activity   Alcohol use: No    Alcohol/week: 60.0 standard drinks of alcohol    Types: 60 Standard drinks or equivalent per week    Comment: daily 12 pk of beer   Drug use: No   Sexual activity: Not Currently  Other Topics Concern   Not on file  Social History Narrative   [place] Veteran. Lives alone   Social Determinants of Health   Financial Resource Strain: Low Risk  (09/09/2021)   Overall Financial Resource Strain (CARDIA)    Difficulty of Paying Living Expenses: Not very hard  Food Insecurity: No Food Insecurity (09/09/2021)   Hunger Vital Sign    Worried About Running Out of Food in the Last Year: Never true    Ran Out of Food in the Last Year: Never true  Transportation Needs: No Transportation Needs (09/09/2021)   PRAPARE - Hydrologist (Medical): No    Lack of Transportation (Non-Medical): No  Physical Activity: Inactive (09/09/2021)   Exercise Vital Sign     Days of Exercise per Week: 0 days    Minutes of Exercise per Session: 0 min  Stress: No Stress Concern Present (09/09/2021)   [place]    Feeling of Stress : Only a little  Social Connections: Socially Isolated (09/09/2021)   Social Connection and Isolation Panel [NHANES]    Frequency of Communication with Friends and Family: More than three times a week    Frequency of Social Gatherings with Friends and Family: More than three times a week    Attends Religious Services: Never    Marine scientist or Organizations: No    Attends Archivist Meetings: Never    Marital Status: Never married  Intimate Partner Violence: Not At Risk (09/09/2021)   Humiliation, Afraid, Rape, and Kick questionnaire    Fear of Current or Ex-Partner: No    Emotionally Abused: No    Physically Abused: No    Sexually Abused: No    Past Surgical History:  Procedure Laterality Date   EXPLORATORY LAPAROTOMY  05/12/2015   EYE SURGERY     FRACTURE SURGERY     LAPAROTOMY N/A 05/10/2015   Procedure: EXPLORATORY LAPAROTOMY;  Surgeon: Ralene Ok, MD;  Location: [place];  Service: General;  Laterality: N/A;   LEFT HEART CATH AND CORONARY ANGIOGRAPHY N/A 05/02/2016   Procedure: Left Heart Cath and Coronary Angiography;  Surgeon: Adrian Prows, MD;  Location: [facility];  Service: Cardiovascular;  Laterality: N/A;   LOWER EXTREMITY ANGIOGRAPHY Left 09/21/2021   Procedure: Lower Extremity Angiography;  Surgeon: Katha Cabal, MD;  Location: [place] [facility];  Service: Cardiovascular;  Laterality: Left;    Family History  Problem Relation Age of Onset   Cirrhosis Father    Hypertension Mother    Lung cancer Sister    Hypertension Sister    Arthritis Sister    Heart murmur Sister     No Known Allergies     Latest Ref Rng & Units 10/31/2021   12:15 PM 09/26/2021    4:21 AM 09/25/2021    6:20 AM  CBC  WBC 4.0 - 10.5 K/uL 5.7   7.1  7.1  Hemoglobin 13.0 - 17.0 g/dL 13.8  12.6  12.7   Hematocrit 39.0 - 52.0 % 40.7  37.1  37.5   Platelets 150 - 400 K/uL 195  178  168       CMP     Component Value Date/Time   NA 141 10/31/2021 1215   K 4.1 10/31/2021 1215   CL 110 10/31/2021 1215   CO2 23 10/31/2021 1215   GLUCOSE 115 (H) 10/31/2021 1215   BUN 14 10/31/2021 1215   CREATININE 0.83 10/31/2021 1215   CREATININE 0.91 12/19/2019 1114   CALCIUM 9.0 10/31/2021 1215   PROT 7.4 12/19/2019 1114   ALBUMIN 3.7 02/16/2017 1317   AST 13 12/19/2019 1114   ALT 20 12/19/2019 1114   ALKPHOS 71 02/16/2017 1317   BILITOT 0.5 12/19/2019 1114   GFRNONAA >60 10/31/2021 1215   GFRNONAA 84 12/19/2019 1114   GFRAA 98 12/19/2019 1114     No results found.     Assessment & Plan:   1. PAD (peripheral artery disease) (HCC) Recommend:  The patient is status post successful angiogram with intervention.  The patient reports that the claudication symptoms and leg pain has improved.   The patient denies lifestyle limiting changes at this point in time.  We will have the patient return in 2 to 3 weeks for ABIs. 2. Dyslipidemia Continue statin as ordered and reviewed, no changes at this time    3. Numbness and tingling of right arm The patient has some numbness and tingling in his right upper extremity and is concerned that this could be embolic much like his previous episode was.  Given his level of peripheral arterial disease we will have him undergo a right upper extremity arterial duplex to determine if there is any occlusions.    Current Outpatient Medications on File Prior to Visit  Medication Sig Dispense Refill   apixaban (ELIQUIS) 5 MG TABS tablet Take 1 tablet (5 mg total) by mouth 2 (two) times daily. 60 tablet 4   Aromatic Inhalants (DECONGESTANT INHALER IN) Inhale into the lungs.     atorvastatin (LIPITOR) 40 MG tablet Take 0.5 tablets (20 mg total) by mouth daily. 90 tablet 3   cilostazol (PLETAL) 100 MG  tablet Take 1 tablet (100 mg total) by mouth 2 (two) times daily. 60 tablet 1   FLUoxetine (PROZAC) 20 MG capsule Take 2 capsules (40 mg total) by mouth daily. 60 capsule 0   furosemide (LASIX) 20 MG tablet Take 1 tablet (20 mg total) by mouth daily. 90 tablet 3   isosorbide mononitrate (IMDUR) 60 MG 24 hr tablet Take 60 mg by mouth daily.     lisinopril (ZESTRIL) 40 MG tablet Take 1 tablet (40 mg total) by mouth daily. 90 tablet 3   metoprolol tartrate (LOPRESSOR) 25 MG tablet Take 1 tablet (25 mg total) by mouth 2 (two) times daily. 180 tablet 3   pantoprazole (PROTONIX) 40 MG tablet Take 1 tablet (40 mg total) by mouth daily. 90 tablet 3   potassium chloride SA (KLOR-CON M) 20 MEQ tablet Take 1 tablet (20 mEq total) by mouth 2 (two) times daily. 180 tablet 3   No current facility-administered medications on file prior to visit.    There are no Patient Instructions on file for this visit. No follow-ups on file.   Kris Hartmann, NP

## 2021-11-07 ENCOUNTER — Ambulatory Visit (INDEPENDENT_AMBULATORY_CARE_PROVIDER_SITE_OTHER): Payer: No Typology Code available for payment source | Admitting: *Deleted

## 2021-11-07 DIAGNOSIS — I4892 Unspecified atrial flutter: Secondary | ICD-10-CM

## 2021-11-07 LAB — POCT INR: INR: 1 — AB (ref 2.0–3.0)

## 2021-11-07 NOTE — Progress Notes (Signed)
Patient came in for PT/INR and to review medications. Patient's INR is 1.0 and states that he is taking his coumadin 4 mg every day at 4. Per Dr. Lavera Guise we are not changing his dose and we are going to recheck it in 10 days.

## 2021-11-10 DIAGNOSIS — J449 Chronic obstructive pulmonary disease, unspecified: Secondary | ICD-10-CM | POA: Diagnosis not present

## 2021-11-10 DIAGNOSIS — Z7901 Long term (current) use of anticoagulants: Secondary | ICD-10-CM | POA: Diagnosis not present

## 2021-11-10 DIAGNOSIS — I4892 Unspecified atrial flutter: Secondary | ICD-10-CM | POA: Diagnosis not present

## 2021-11-10 DIAGNOSIS — Z48812 Encounter for surgical aftercare following surgery on the circulatory system: Secondary | ICD-10-CM | POA: Diagnosis not present

## 2021-11-10 DIAGNOSIS — I69351 Hemiplegia and hemiparesis following cerebral infarction affecting right dominant side: Secondary | ICD-10-CM | POA: Diagnosis not present

## 2021-11-10 DIAGNOSIS — I129 Hypertensive chronic kidney disease with stage 1 through stage 4 chronic kidney disease, or unspecified chronic kidney disease: Secondary | ICD-10-CM | POA: Diagnosis not present

## 2021-11-10 DIAGNOSIS — I739 Peripheral vascular disease, unspecified: Secondary | ICD-10-CM | POA: Diagnosis not present

## 2021-11-10 DIAGNOSIS — N189 Chronic kidney disease, unspecified: Secondary | ICD-10-CM | POA: Diagnosis not present

## 2021-11-10 DIAGNOSIS — I70203 Unspecified atherosclerosis of native arteries of extremities, bilateral legs: Secondary | ICD-10-CM | POA: Diagnosis not present

## 2021-11-10 DIAGNOSIS — E785 Hyperlipidemia, unspecified: Secondary | ICD-10-CM | POA: Diagnosis not present

## 2021-11-14 ENCOUNTER — Encounter: Payer: Self-pay | Admitting: Internal Medicine

## 2021-11-14 ENCOUNTER — Ambulatory Visit (INDEPENDENT_AMBULATORY_CARE_PROVIDER_SITE_OTHER): Payer: No Typology Code available for payment source | Admitting: Internal Medicine

## 2021-11-14 ENCOUNTER — Other Ambulatory Visit: Payer: Self-pay | Admitting: *Deleted

## 2021-11-14 ENCOUNTER — Other Ambulatory Visit (INDEPENDENT_AMBULATORY_CARE_PROVIDER_SITE_OTHER): Payer: Self-pay | Admitting: Vascular Surgery

## 2021-11-14 VITALS — BP 112/78 | HR 62 | Ht 73.0 in | Wt 225.2 lb

## 2021-11-14 DIAGNOSIS — I739 Peripheral vascular disease, unspecified: Secondary | ICD-10-CM | POA: Diagnosis not present

## 2021-11-14 DIAGNOSIS — Z9862 Peripheral vascular angioplasty status: Secondary | ICD-10-CM

## 2021-11-14 DIAGNOSIS — I700 Atherosclerosis of aorta: Secondary | ICD-10-CM

## 2021-11-14 DIAGNOSIS — Z23 Encounter for immunization: Secondary | ICD-10-CM

## 2021-11-14 DIAGNOSIS — I2510 Atherosclerotic heart disease of native coronary artery without angina pectoris: Secondary | ICD-10-CM

## 2021-11-14 DIAGNOSIS — I70229 Atherosclerosis of native arteries of extremities with rest pain, unspecified extremity: Secondary | ICD-10-CM

## 2021-11-14 DIAGNOSIS — I4891 Unspecified atrial fibrillation: Secondary | ICD-10-CM

## 2021-11-14 DIAGNOSIS — I4892 Unspecified atrial flutter: Secondary | ICD-10-CM

## 2021-11-14 LAB — POCT INR: INR: 3.2 — AB (ref 2.0–3.0)

## 2021-11-14 MED ORDER — FLUOXETINE HCL 20 MG PO CAPS: 40.0000 mg | ORAL_CAPSULE | Freq: Every day | ORAL | 0 refills | Status: DC

## 2021-11-14 NOTE — Assessment & Plan Note (Signed)
Patient has been scheduled for arterial study

## 2021-11-14 NOTE — Assessment & Plan Note (Signed)
Take Coumadin 4 mg 5 days a week, return in 2 weeks for pro time

## 2021-11-14 NOTE — Progress Notes (Signed)
Established Patient Office Visit  Subjective:  Patient ID: Gregory Lane, male    DOB: [birth year]  Age: 71 y.o. MRN: [id number]  CC:  Chief Complaint  Patient presents with   Atrial Fibrillation    HPI  Gregory Lane presents for check up, he is on coumadin  for at fib , 4 mg daily  pro time is 34  Past Medical History:  Diagnosis Date   Aortic atherosclerosis ([place]) 02/16/2017   Arthritis    Blind left eye    Hypertension    Low TSH level 02/17/2017   Myocardial infarction [facility])    Perforated duodenal ulcer ([place])    Stroke [facility])     Past Surgical History:  Procedure Laterality Date   EXPLORATORY LAPAROTOMY  05/12/2015   EYE SURGERY     FRACTURE SURGERY     LAPAROTOMY N/A 05/10/2015   Procedure: EXPLORATORY LAPAROTOMY;  Surgeon: Ralene Ok, MD;  Location: [place];  Service: General;  Laterality: N/A;   LEFT HEART CATH AND CORONARY ANGIOGRAPHY N/A 05/02/2016   Procedure: Left Heart Cath and Coronary Angiography;  Surgeon: Adrian Prows, MD;  Location: [place] [facility];  Service: Cardiovascular;  Laterality: N/A;   LOWER EXTREMITY ANGIOGRAPHY Left 09/21/2021   Procedure: Lower Extremity Angiography;  Surgeon: Katha Cabal, MD;  Location: [place] [facility];  Service: Cardiovascular;  Laterality: Left;    Family History  Problem Relation Age of Onset   Cirrhosis Father    Hypertension Mother    Lung cancer Sister    Hypertension Sister    Arthritis Sister    Heart murmur Sister     Social History   Socioeconomic History   Marital status: Single    Spouse name: Not on file   Number of children: Not on file   Years of education: Not on file   Highest education level: Not on file  Occupational History   Not on file  Tobacco Use   Smoking status: Every Day    Packs/day: 1.00    Years: 60.00    Total pack years: 60.00    Types: Cigarettes   Smokeless tobacco: Never  Substance and Sexual Activity   Alcohol use: No    Alcohol/week: 60.0 standard  drinks of alcohol    Types: 60 Standard drinks or equivalent per week    Comment: daily 12 pk of beer   Drug use: No   Sexual activity: Not Currently  Other Topics Concern   Not on file  Social History Narrative   [place] Veteran. Lives alone   Social Determinants of Health   Financial Resource Strain: Low Risk  (09/09/2021)   Overall Financial Resource Strain (CARDIA)    Difficulty of Paying Living Expenses: Not very hard  Food Insecurity: No Food Insecurity (09/09/2021)   Hunger Vital Sign    Worried About Running Out of Food in the Last Year: Never true    Ran Out of Food in the Last Year: Never true  Transportation Needs: No Transportation Needs (09/09/2021)   PRAPARE - Hydrologist (Medical): No    Lack of Transportation (Non-Medical): No  Physical Activity: Inactive (09/09/2021)   Exercise Vital Sign    Days of Exercise per Week: 0 days    Minutes of Exercise per Session: 0 min  Stress: No Stress Concern Present (09/09/2021)   [place]    Feeling of Stress : Only a little  Social Connections: Socially Isolated (09/09/2021)   Social Connection and Isolation Panel [NHANES]    Frequency of Communication with Friends and Family: More than three times a week    Frequency of Social Gatherings with Friends and Family: More than three times a week    Attends Religious Services: Never    Marine scientist or Organizations: No    Attends Archivist Meetings: Never    Marital Status: Never married  Intimate Partner Violence: Not At Risk (09/09/2021)   Humiliation, Afraid, Rape, and Kick questionnaire    Fear of Current or Ex-Partner: No    Emotionally Abused: No    Physically Abused: No    Sexually Abused: No     Current Outpatient Medications:    amLODipine (NORVASC) 5 MG tablet, Take 1 tablet (5 mg total) by mouth daily., Disp: 30 tablet, Rfl: 0   Aromatic Inhalants  (DECONGESTANT INHALER IN), Inhale into the lungs., Disp: , Rfl:    atorvastatin (LIPITOR) 40 MG tablet, Take 0.5 tablets (20 mg total) by mouth daily., Disp: 90 tablet, Rfl: 3   cilostazol (PLETAL) 100 MG tablet, Take 1 tablet (100 mg total) by mouth 2 (two) times daily., Disp: 60 tablet, Rfl: 1   FLUoxetine (PROZAC) 20 MG capsule, Take 2 capsules (40 mg total) by mouth daily., Disp: 60 capsule, Rfl: 0   furosemide (LASIX) 20 MG tablet, Take 1 tablet (20 mg total) by mouth daily., Disp: 90 tablet, Rfl: 3   lisinopril (ZESTRIL) 40 MG tablet, Take 1 tablet (40 mg total) by mouth daily., Disp: 90 tablet, Rfl: 3   meclizine (ANTIVERT) 25 MG tablet, Take 25 mg by mouth 3 (three) times daily as needed for dizziness., Disp: , Rfl:    metoprolol tartrate (LOPRESSOR) 25 MG tablet, Take 1 tablet (25 mg total) by mouth 2 (two) times daily., Disp: 180 tablet, Rfl: 3   pantoprazole (PROTONIX) 40 MG tablet, Take 1 tablet (40 mg total) by mouth daily., Disp: 90 tablet, Rfl: 3   potassium chloride SA (KLOR-CON M) 20 MEQ tablet, Take 1 tablet (20 mEq total) by mouth 2 (two) times daily., Disp: 180 tablet, Rfl: 3   warfarin (COUMADIN) 4 MG tablet, Take 4 mg by mouth daily., Disp: , Rfl:    No Known Allergies  ROS Review of Systems  Eyes:  Negative for redness.  Respiratory:  Negative for choking, chest tightness and shortness of breath.   Cardiovascular:  Positive for palpitations. Negative for chest pain and leg swelling.  Genitourinary:  Negative for dysuria and hematuria.  Neurological:  Negative for dizziness and seizures.  Psychiatric/Behavioral:  Negative for behavioral problems.       Objective:    Physical Exam Vitals reviewed.  Constitutional:      Appearance: Normal appearance.  HENT:     Mouth/Throat:     Mouth: Mucous membranes are moist.  Eyes:     Pupils: Pupils are equal, round, and reactive to light.  Neck:     Vascular: No carotid bruit.  Cardiovascular:     Rate and Rhythm:  Normal rate. Rhythm irregular.     Pulses: Normal pulses.     Heart sounds: Normal heart sounds.  Pulmonary:     Effort: Pulmonary effort is normal.     Breath sounds: Normal breath sounds. No wheezing or rhonchi.  Abdominal:     General: Bowel sounds are normal.     Palpations: Abdomen is soft. There is no hepatomegaly, splenomegaly or mass.  Tenderness: There is no abdominal tenderness.     Hernia: No hernia is present.  Musculoskeletal:     Cervical back: Neck supple.     Right lower leg: No edema.     Left lower leg: No edema.  Skin:    Findings: No rash.  Neurological:     Mental Status: He is alert and oriented to person, place, and time.     Motor: No weakness.  Psychiatric:        Mood and Affect: Mood normal.        Behavior: Behavior normal.     BP 112/78   Pulse 62   Ht 6\' 1"  (1.854 m)   Wt 225 lb 3.2 oz (102.2 kg)   BMI 29.71 kg/m  Wt Readings from Last 3 Encounters:  11/14/21 225 lb 3.2 oz (102.2 kg)  10/31/21 225 lb (102.1 kg)  10/13/21 226 lb (102.5 kg)     Health Maintenance Due  Topic Date Due   Hepatitis C Screening  Never done   Zoster Vaccines- Shingrix (1 of 2) Never done   Pneumonia Vaccine 55+ Years old (2 - PCV) 05/14/2016   COVID-19 Vaccine (2 - Moderna series) 11/24/2019    There are no preventive care reminders to display for this patient.  Lab Results  Component Value Date   TSH 1.78 12/19/2019   Lab Results  Component Value Date   WBC 5.7 10/31/2021   HGB 13.8 10/31/2021   HCT 40.7 10/31/2021   MCV 101.2 (H) 10/31/2021   PLT 195 10/31/2021   Lab Results  Component Value Date   NA 141 10/31/2021   K 4.1 10/31/2021   CO2 23 10/31/2021   GLUCOSE 115 (H) 10/31/2021   BUN 14 10/31/2021   CREATININE 0.83 10/31/2021   BILITOT 0.5 12/19/2019   ALKPHOS 71 02/16/2017   AST 13 12/19/2019   ALT 20 12/19/2019   PROT 7.4 12/19/2019   ALBUMIN 3.7 02/16/2017   CALCIUM 9.0 10/31/2021   ANIONGAP 8 10/31/2021   Lab Results   Component Value Date   CHOL 235 (H) 12/19/2019   Lab Results  Component Value Date   HDL 46 12/19/2019   Lab Results  Component Value Date   LDLCALC 160 (H) 12/19/2019   Lab Results  Component Value Date   TRIG 155 (H) 12/19/2019   Lab Results  Component Value Date   CHOLHDL 5.1 (H) 12/19/2019   Lab Results  Component Value Date   HGBA1C 5.5 05/24/2015      Assessment & Plan:   Problem List Items Addressed This Visit       Cardiovascular and Mediastinum   Aortic atherosclerosis ([place])    Hypercholesterolemia  I advised the patient to follow Mediterranean diet This diet is rich in fruits vegetables and whole grain, and This diet is also rich in fish and lean meat Patient should also eat a handful of almonds or walnuts daily Recent heart study indicated that average follow-up on this kind of diet reduces the cardiovascular mortality by 50 to 70%==      PAD (peripheral artery disease) ([place])    Patient has been scheduled for arterial study      Atrial flutter (HCC)    Take Coumadin 4 mg 5 days a week, return in 2 weeks for pro time      Coronary artery disease    Patient does not have any angina      Other Visit Diagnoses     Need  for influenza vaccination    -  Primary   Relevant Orders   Flu Vaccine QUAD High Dose(Fluad) (Completed)   Atrial fibrillation, unspecified type ([place])       Relevant Orders   POCT INR (Completed)     Coumadin  4 mg  5 days  a wk  No orders of the defined types were placed in this encounter.   Follow-up: No follow-ups on file.    Cletis Athens, MD

## 2021-11-14 NOTE — Assessment & Plan Note (Signed)
Patient does not have any angina

## 2021-11-14 NOTE — Assessment & Plan Note (Signed)
Hypercholesterolemia  I advised the patient to follow Mediterranean diet This diet is rich in fruits vegetables and whole grain, and This diet is also rich in fish and lean meat Patient should also eat a handful of almonds or walnuts daily Recent heart study indicated that average follow-up on this kind of diet reduces the cardiovascular mortality by 50 to 70%== 

## 2021-11-15 ENCOUNTER — Encounter (INDEPENDENT_AMBULATORY_CARE_PROVIDER_SITE_OTHER): Payer: Self-pay | Admitting: Nurse Practitioner

## 2021-11-15 ENCOUNTER — Ambulatory Visit (INDEPENDENT_AMBULATORY_CARE_PROVIDER_SITE_OTHER): Payer: No Typology Code available for payment source

## 2021-11-15 ENCOUNTER — Ambulatory Visit (INDEPENDENT_AMBULATORY_CARE_PROVIDER_SITE_OTHER): Payer: No Typology Code available for payment source | Admitting: Nurse Practitioner

## 2021-11-15 VITALS — BP 110/63 | HR 64 | Resp 16 | Wt 225.0 lb

## 2021-11-15 DIAGNOSIS — I10 Essential (primary) hypertension: Secondary | ICD-10-CM | POA: Diagnosis not present

## 2021-11-15 DIAGNOSIS — F17200 Nicotine dependence, unspecified, uncomplicated: Secondary | ICD-10-CM

## 2021-11-15 DIAGNOSIS — I739 Peripheral vascular disease, unspecified: Secondary | ICD-10-CM

## 2021-11-15 DIAGNOSIS — I70229 Atherosclerosis of native arteries of extremities with rest pain, unspecified extremity: Secondary | ICD-10-CM

## 2021-11-15 DIAGNOSIS — Z9862 Peripheral vascular angioplasty status: Secondary | ICD-10-CM | POA: Diagnosis not present

## 2021-11-15 DIAGNOSIS — E785 Hyperlipidemia, unspecified: Secondary | ICD-10-CM

## 2021-11-16 ENCOUNTER — Encounter (INDEPENDENT_AMBULATORY_CARE_PROVIDER_SITE_OTHER): Payer: Self-pay | Admitting: Nurse Practitioner

## 2021-11-16 ENCOUNTER — Ambulatory Visit: Payer: No Typology Code available for payment source | Admitting: Internal Medicine

## 2021-11-16 NOTE — Progress Notes (Signed)
Subjective:    Patient ID: Gregory Lane, male    DOB: 1950/05/24, 71 y.o.   MRN: [id number] Chief Complaint  Patient presents with   Follow-up    Ultrasound follow up    The patient returns to the office for followup and review status post angiogram with intervention on 09/21/2021.   Procedure: Procedure(s) Performed:             1.  Abdominal aortogram             2.  Left lower extremity distal runoff third order catheter placement with additional third order             3.  Mechanical thrombectomy left SFA with the penumbra CAT 7 device             4.  Mechanical thrombectomy of the left profunda femoris with the penumbra CAT 7 device             5.  Mechanical thrombectomy of the distal left common femoral artery with the penumbra CAT 7 device             6.  [employer] right common femoral artery  The patient notes improvement in the lower extremity symptoms. No interval shortening of the patient's claudication distance or rest pain symptoms. No new ulcers or wounds have occurred since the last visit.  There have been no significant changes to the patient's overall health care.  No documented history of amaurosis fugax or recent TIA symptoms. There are no recent neurological changes noted. No documented history of DVT, PE or superficial thrombophlebitis. The patient denies recent episodes of angina or shortness of breath.   ABI's Rt=1.00 and Lt=0.96  (no previous) Duplex US of the left lower extremity shows triphasic tibial artery waveforms with biphasic waveforms throughout the right lower extremity.    Review of Systems  Neurological:  Negative for numbness.  All other systems reviewed and are negative.      Objective:   Physical Exam Vitals reviewed.  HENT:     Head: Normocephalic.  Cardiovascular:     Rate and Rhythm: Normal rate.     Pulses: Normal pulses.  Pulmonary:     Effort: Pulmonary effort is normal.  Skin:    General: Skin is warm and dry.   Neurological:     Mental Status: He is alert and oriented to person, place, and time.  Psychiatric:        Mood and Affect: Mood normal.        Behavior: Behavior normal.        Thought Content: Thought content normal.        Judgment: Judgment normal.     BP 110/63 (BP Location: Right Arm)   Pulse 64   Resp 16   Wt 225 lb (102.1 kg)   BMI 29.69 kg/m   Past Medical History:  Diagnosis Date   Aortic atherosclerosis ([place]) 02/16/2017   Arthritis    Blind left eye    Hypertension    Low TSH level 02/17/2017   Myocardial infarction ([place])    Perforated duodenal ulcer ([place])    Stroke [facility])     Social History   Socioeconomic History   Marital status: Single    Spouse name: Not on file   Number of children: Not on file   Years of education: Not on file   Highest education level: Not on file  Occupational History   Not on file  Tobacco  Use   Smoking status: Every Day    Packs/day: 1.00    Years: 60.00    Total pack years: 60.00    Types: Cigarettes   Smokeless tobacco: Never  Substance and Sexual Activity   Alcohol use: No    Alcohol/week: 60.0 standard drinks of alcohol    Types: 60 Standard drinks or equivalent per week    Comment: daily 12 pk of beer   Drug use: No   Sexual activity: Not Currently  Other Topics Concern   Not on file  Social History Narrative   [place] Veteran. Lives alone   Social Determinants of Health   Financial Resource Strain: Low Risk  (09/09/2021)   Overall Financial Resource Strain (CARDIA)    Difficulty of Paying Living Expenses: Not very hard  Food Insecurity: No Food Insecurity (09/09/2021)   Hunger Vital Sign    Worried About Running Out of Food in the Last Year: Never true    Ran Out of Food in the Last Year: Never true  Transportation Needs: No Transportation Needs (09/09/2021)   PRAPARE - Hydrologist (Medical): No    Lack of Transportation (Non-Medical): No  Physical Activity: Inactive  (09/09/2021)   Exercise Vital Sign    Days of Exercise per Week: 0 days    Minutes of Exercise per Session: 0 min  Stress: No Stress Concern Present (09/09/2021)   [place]    Feeling of Stress : Only a little  Social Connections: Socially Isolated (09/09/2021)   Social Connection and Isolation Panel [NHANES]    Frequency of Communication with Friends and Family: More than three times a week    Frequency of Social Gatherings with Friends and Family: More than three times a week    Attends Religious Services: Never    Marine scientist or Organizations: No    Attends Archivist Meetings: Never    Marital Status: Never married  Intimate Partner Violence: Not At Risk (09/09/2021)   Humiliation, Afraid, Rape, and Kick questionnaire    Fear of Current or Ex-Partner: No    Emotionally Abused: No    Physically Abused: No    Sexually Abused: No    Past Surgical History:  Procedure Laterality Date   EXPLORATORY LAPAROTOMY  05/12/2015   EYE SURGERY     FRACTURE SURGERY     LAPAROTOMY N/A 05/10/2015   Procedure: EXPLORATORY LAPAROTOMY;  Surgeon: Ralene Ok, MD;  Location: [place];  Service: General;  Laterality: N/A;   LEFT HEART CATH AND CORONARY ANGIOGRAPHY N/A 05/02/2016   Procedure: Left Heart Cath and Coronary Angiography;  Surgeon: Adrian Prows, MD;  Location: [place] [facility];  Service: Cardiovascular;  Laterality: N/A;   LOWER EXTREMITY ANGIOGRAPHY Left 09/21/2021   Procedure: Lower Extremity Angiography;  Surgeon: Katha Cabal, MD;  Location: [place] [facility];  Service: Cardiovascular;  Laterality: Left;    Family History  Problem Relation Age of Onset   Cirrhosis Father    Hypertension Mother    Lung cancer Sister    Hypertension Sister    Arthritis Sister    Heart murmur Sister     No Known Allergies     Latest Ref Rng & Units 10/31/2021   12:15 PM 09/26/2021    4:21 AM 09/25/2021     6:20 AM  CBC  WBC 4.0 - 10.5 K/uL 5.7  7.1  7.1   Hemoglobin 13.0 -  17.0 g/dL 13.8  12.6  12.7   Hematocrit 39.0 - 52.0 % 40.7  37.1  37.5   Platelets 150 - 400 K/uL 195  178  168       CMP     Component Value Date/Time   NA 141 10/31/2021 1215   K 4.1 10/31/2021 1215   CL 110 10/31/2021 1215   CO2 23 10/31/2021 1215   GLUCOSE 115 (H) 10/31/2021 1215   BUN 14 10/31/2021 1215   CREATININE 0.83 10/31/2021 1215   CREATININE 0.91 12/19/2019 1114   CALCIUM 9.0 10/31/2021 1215   PROT 7.4 12/19/2019 1114   ALBUMIN 3.7 02/16/2017 1317   AST 13 12/19/2019 1114   ALT 20 12/19/2019 1114   ALKPHOS 71 02/16/2017 1317   BILITOT 0.5 12/19/2019 1114   GFRNONAA >60 10/31/2021 1215   GFRNONAA 84 12/19/2019 1114   GFRAA 98 12/19/2019 1114     VAS [place] ABI WITH/WO TBI  Result Date: 11/16/2021  LOWER EXTREMITY DOPPLER STUDY Patient Name:  ARCHIMEDES HAROLD  Date of Exam:   11/15/2021 Medical Rec #: [id number]         Accession #:    [id number] Date of Birth: December 15, 1950         Patient Gender: M Patient Age:   70 years Exam Location:  [facility] Procedure:      VAS [place] ABI WITH/WO TBI Referring Phys: [facility] --------------------------------------------------------------------------------  Indications: Peripheral artery disease. High Risk Factors: Hypertension, current smoker, prior CVA.  Vascular Interventions: 09/21/2021 Mechanical thrombectomy left SFA with the                         penumbra CAT 7 device Mechanical thrombectomy of the                         left profunda femoris with the penumbra CAT 7 device                         Mechanical thrombectomy of the distal left common                         femoral artery with the penumbra CAT 7 device. Performing Technologist: Delorise Shiner RVT  Examination Guidelines: A complete evaluation includes at minimum, Doppler waveform signals and systolic blood pressure reading at the level of bilateral brachial, anterior tibial, and  posterior tibial arteries, when vessel segments are accessible. Bilateral testing is considered an integral part of a complete examination. Photoelectric Plethysmograph (PPG) waveforms and toe systolic pressure readings are included as required and additional duplex testing as needed. Limited examinations for reoccurring indications may be performed as noted.  ABI Findings: +---------+------------------+-----+---------+--------+ Right    Rt Pressure (mmHg)IndexWaveform Comment  +---------+------------------+-----+---------+--------+ Brachial 123                                      +---------+------------------+-----+---------+--------+ ATA      123               1.00 triphasic         +---------+------------------+-----+---------+--------+ PTA      123               1.00 biphasic          +---------+------------------+-----+---------+--------+ Luz Lex  0.78                   +---------+------------------+-----+---------+--------+ +---------+------------------+-----+---------+-------+ Left     Lt Pressure (mmHg)IndexWaveform Comment +---------+------------------+-----+---------+-------+ Brachial 116                                     +---------+------------------+-----+---------+-------+ ATA      106               0.86 triphasic        +---------+------------------+-----+---------+-------+ PTA      118               0.96 triphasic        +---------+------------------+-----+---------+-------+ Great Toe96                0.78                  +---------+------------------+-----+---------+-------+ +-------+-----------+-----------+------------+------------+ ABI/TBIToday's ABIToday's TBIPrevious ABIPrevious TBI +-------+-----------+-----------+------------+------------+ Right  1.00       0.78                                +-------+-----------+-----------+------------+------------+ Left   0.96       0.78                                 +-------+-----------+-----------+------------+------------+  Summary: Right: Resting right ankle-brachial index is within normal range. The right toe-brachial index is normal. Left: Resting left ankle-brachial index is within normal range. The left toe-brachial index is normal. *See table(s) above for measurements and observations.  Electronically signed by Leotis Pain MD on 11/16/2021 at 11:42:11 AM.    Final        Assessment & Plan:   1. PAD (peripheral artery disease) (HCC) Recommend:  The patient is status post successful angiogram with intervention.  The patient reports that the claudication symptoms and leg pain has improved.   The patient denies lifestyle limiting changes at this point in time.  No further invasive studies, angiography or surgery at this time The patient should continue walking and begin a more formal exercise program.  The patient should continue antiplatelet therapy and aggressive treatment of the lipid abnormalities  Continued surveillance is indicated as atherosclerosis is likely to progress with time.    Patient should undergo noninvasive studies as ordered. The patient will follow up with me to review the studies.    2. Dyslipidemia Continue statin as ordered and reviewed, no changes at this time   3. Essential hypertension Continue antihypertensive medications as already ordered, these medications have been reviewed and there are no changes at this time.   4. Smoking Smoking cessation was discussed, 3-10 minutes spent on this topic specifically    Current Outpatient Medications on File Prior to Visit  Medication Sig Dispense Refill   amLODipine (NORVASC) 5 MG tablet Take 1 tablet (5 mg total) by mouth daily. 30 tablet 0   Aromatic Inhalants (DECONGESTANT INHALER IN) Inhale into the lungs.     atorvastatin (LIPITOR) 40 MG tablet Take 0.5 tablets (20 mg total) by mouth daily. 90 tablet 3   cilostazol (PLETAL) 100 MG tablet Take 1 tablet (100 mg  total) by mouth 2 (two) times daily. 60 tablet 1   FLUoxetine (PROZAC) 20 MG capsule Take 2 capsules (40 mg total)  by mouth daily. 60 capsule 0   furosemide (LASIX) 20 MG tablet Take 1 tablet (20 mg total) by mouth daily. 90 tablet 3   lisinopril (ZESTRIL) 40 MG tablet Take 1 tablet (40 mg total) by mouth daily. 90 tablet 3   meclizine (ANTIVERT) 25 MG tablet Take 25 mg by mouth 3 (three) times daily as needed for dizziness.     metoprolol tartrate (LOPRESSOR) 25 MG tablet Take 1 tablet (25 mg total) by mouth 2 (two) times daily. 180 tablet 3   pantoprazole (PROTONIX) 40 MG tablet Take 1 tablet (40 mg total) by mouth daily. 90 tablet 3   potassium chloride SA (KLOR-CON M) 20 MEQ tablet Take 1 tablet (20 mEq total) by mouth 2 (two) times daily. 180 tablet 3   warfarin (COUMADIN) 4 MG tablet Take 4 mg by mouth daily.     No current facility-administered medications on file prior to visit.    There are no Patient Instructions on file for this visit. No follow-ups on file.   Kris Hartmann, NP

## 2021-11-24 DIAGNOSIS — N189 Chronic kidney disease, unspecified: Secondary | ICD-10-CM | POA: Diagnosis not present

## 2021-11-24 DIAGNOSIS — E785 Hyperlipidemia, unspecified: Secondary | ICD-10-CM | POA: Diagnosis not present

## 2021-11-24 DIAGNOSIS — Z48812 Encounter for surgical aftercare following surgery on the circulatory system: Secondary | ICD-10-CM | POA: Diagnosis not present

## 2021-11-24 DIAGNOSIS — I70203 Unspecified atherosclerosis of native arteries of extremities, bilateral legs: Secondary | ICD-10-CM | POA: Diagnosis not present

## 2021-11-24 DIAGNOSIS — I4892 Unspecified atrial flutter: Secondary | ICD-10-CM | POA: Diagnosis not present

## 2021-11-24 DIAGNOSIS — I129 Hypertensive chronic kidney disease with stage 1 through stage 4 chronic kidney disease, or unspecified chronic kidney disease: Secondary | ICD-10-CM | POA: Diagnosis not present

## 2021-11-24 DIAGNOSIS — Z7901 Long term (current) use of anticoagulants: Secondary | ICD-10-CM | POA: Diagnosis not present

## 2021-11-24 DIAGNOSIS — J449 Chronic obstructive pulmonary disease, unspecified: Secondary | ICD-10-CM | POA: Diagnosis not present

## 2021-11-24 DIAGNOSIS — I739 Peripheral vascular disease, unspecified: Secondary | ICD-10-CM | POA: Diagnosis not present

## 2021-11-24 DIAGNOSIS — I69351 Hemiplegia and hemiparesis following cerebral infarction affecting right dominant side: Secondary | ICD-10-CM | POA: Diagnosis not present

## 2021-11-25 DIAGNOSIS — I4892 Unspecified atrial flutter: Secondary | ICD-10-CM | POA: Diagnosis not present

## 2021-11-25 DIAGNOSIS — I739 Peripheral vascular disease, unspecified: Secondary | ICD-10-CM | POA: Diagnosis not present

## 2021-11-25 DIAGNOSIS — I129 Hypertensive chronic kidney disease with stage 1 through stage 4 chronic kidney disease, or unspecified chronic kidney disease: Secondary | ICD-10-CM | POA: Diagnosis not present

## 2021-11-25 DIAGNOSIS — I69351 Hemiplegia and hemiparesis following cerebral infarction affecting right dominant side: Secondary | ICD-10-CM | POA: Diagnosis not present

## 2021-11-25 DIAGNOSIS — E785 Hyperlipidemia, unspecified: Secondary | ICD-10-CM | POA: Diagnosis not present

## 2021-11-25 DIAGNOSIS — Z48812 Encounter for surgical aftercare following surgery on the circulatory system: Secondary | ICD-10-CM | POA: Diagnosis not present

## 2021-11-25 DIAGNOSIS — J449 Chronic obstructive pulmonary disease, unspecified: Secondary | ICD-10-CM | POA: Diagnosis not present

## 2021-11-25 DIAGNOSIS — I70203 Unspecified atherosclerosis of native arteries of extremities, bilateral legs: Secondary | ICD-10-CM | POA: Diagnosis not present

## 2021-11-25 DIAGNOSIS — N189 Chronic kidney disease, unspecified: Secondary | ICD-10-CM | POA: Diagnosis not present

## 2021-11-25 DIAGNOSIS — Z7901 Long term (current) use of anticoagulants: Secondary | ICD-10-CM | POA: Diagnosis not present

## 2021-11-28 ENCOUNTER — Ambulatory Visit (INDEPENDENT_AMBULATORY_CARE_PROVIDER_SITE_OTHER): Payer: No Typology Code available for payment source | Admitting: Internal Medicine

## 2021-11-28 ENCOUNTER — Encounter: Payer: Self-pay | Admitting: Internal Medicine

## 2021-11-28 VITALS — BP 107/59 | HR 47 | Ht 73.0 in | Wt 225.0 lb

## 2021-11-28 DIAGNOSIS — I10 Essential (primary) hypertension: Secondary | ICD-10-CM

## 2021-11-28 DIAGNOSIS — J449 Chronic obstructive pulmonary disease, unspecified: Secondary | ICD-10-CM

## 2021-11-28 DIAGNOSIS — K219 Gastro-esophageal reflux disease without esophagitis: Secondary | ICD-10-CM

## 2021-11-28 DIAGNOSIS — I4892 Unspecified atrial flutter: Secondary | ICD-10-CM | POA: Diagnosis not present

## 2021-11-28 LAB — POCT INR: INR: 2.3 (ref 2.0–3.0)

## 2021-11-28 MED ORDER — WARFARIN SODIUM 4 MG PO TABS: 4.0000 mg | ORAL_TABLET | Freq: Every day | ORAL | 6 refills | Status: DC

## 2021-11-28 MED ORDER — LISINOPRIL 40 MG PO TABS: 20.0000 mg | ORAL_TABLET | Freq: Every day | ORAL | 3 refills | Status: DC

## 2021-11-28 MED ORDER — ATORVASTATIN CALCIUM 40 MG PO TABS: 20.0000 mg | ORAL_TABLET | Freq: Every day | ORAL | 3 refills | Status: DC

## 2021-11-28 NOTE — Assessment & Plan Note (Signed)
protime Today is going to be 25.0, I asked the patient to take his Coumadin 6 days a week then 5 days a week

## 2021-11-28 NOTE — Assessment & Plan Note (Signed)
-   I instructed the patient to stop smoking and provided them with smoking cessation materials.  - I informed the patient that smoking puts them at increased risk for cancer, COPD, hypertension, and more.  - Informed the patient to seek help if they begin to have trouble breathing, develop chest pain, start to cough up blood, feel faint, or pass out.  

## 2021-11-28 NOTE — Assessment & Plan Note (Addendum)
Patient blood pressure is normal so we will stop the amlodipine decreased  Lisinopril to  20 mg po daily and metoprolol as before  we will see him back in 2 weeks if his blood pressure is still low we may have to stop lisinopril Decrease prozac  To 20 mg po daily

## 2021-11-28 NOTE — Progress Notes (Signed)
Established Patient Office Visit  Subjective:  Patient ID: Gregory Lane, male    DOB: 05/07/1950  Age: 71 y.o. MRN: [id number]  CC:  Chief Complaint  Patient presents with   Atrial Fibrillation   Medication Problem    Patient's physical therapist called and stated that patient's blood pressure and heart rate is low every time she goes to the home. She is concerned that he is on too much medication. Patient is taking lisinopril, amlodipine, and  metoprolol.     Atrial Fibrillation Past medical history includes atrial fibrillation.    Gregory Lane presents for check up  Past Medical History:  Diagnosis Date   Aortic atherosclerosis ([place]) 02/16/2017   Arthritis    Blind left eye    Gregory    Low TSH level 02/17/2017   Myocardial infarction [facility])    Perforated duodenal ulcer ([place])    Stroke [facility])     Past Surgical History:  Procedure Laterality Date   EXPLORATORY LAPAROTOMY  05/12/2015   EYE SURGERY     FRACTURE SURGERY     LAPAROTOMY N/A 05/10/2015   Procedure: EXPLORATORY LAPAROTOMY;  Surgeon: Ralene Ok, MD;  Location: [place];  Service: General;  Laterality: N/A;   LEFT HEART CATH AND CORONARY ANGIOGRAPHY N/A 05/02/2016   Procedure: Left Heart Cath and Coronary Angiography;  Surgeon: Adrian Prows, MD;  Location: [place] [facility];  Service: Cardiovascular;  Laterality: N/A;   LOWER EXTREMITY ANGIOGRAPHY Left 09/21/2021   Procedure: Lower Extremity Angiography;  Surgeon: Katha Cabal, MD;  Location: [place] [facility];  Service: Cardiovascular;  Laterality: Left;    Family History  Problem Relation Age of Onset   Gregory Lane    Gregory Lane    Gregory Lane    Gregory Lane    Arthritis Lane    Heart murmur Lane     Social History   Socioeconomic History   Marital status: Single    Spouse name: Not on file   Number of children: Not on file   Years of education: Not on file   Highest education level: Not on  file  Occupational History   Not on file  Tobacco Use   Smoking status: Every Day    Packs/day: 1.00    Years: 60.00    Total pack years: 60.00    Types: Cigarettes   Smokeless tobacco: Never  Substance and Sexual Activity   Alcohol use: No    Alcohol/week: 60.0 standard drinks of alcohol    Types: 60 Standard drinks or equivalent per week    Comment: daily 12 pk of beer   Drug use: No   Sexual activity: Not Currently  Other Topics Concern   Not on file  Social History Narrative   [place] Veteran. Lives alone   Social Determinants of Health   Financial Resource Strain: Low Risk  (09/09/2021)   Overall Financial Resource Strain (CARDIA)    Difficulty of Paying Living Expenses: Not very hard  Food Insecurity: No Food Insecurity (09/09/2021)   Hunger Vital Sign    Worried About Running Out of Food in the Last Year: Never true    Ran Out of Food in the Last Year: Never true  Transportation Needs: No Transportation Needs (09/09/2021)   PRAPARE - Hydrologist (Medical): No    Lack of Transportation (Non-Medical): No  Physical Activity: Inactive (09/09/2021)   Exercise Vital Sign    Days of Exercise per Week:  0 days    Minutes of Exercise per Session: 0 min  Stress: No Stress Concern Present (09/09/2021)   [place]    Feeling of Stress : Only a little  Social Connections: Socially Isolated (09/09/2021)   Social Connection and Isolation Panel [NHANES]    Frequency of Communication with Friends and Family: More than three times a week    Frequency of Social Gatherings with Friends and Family: More than three times a week    Attends Religious Services: Never    Marine scientist or Organizations: No    Attends Archivist Meetings: Never    Marital Status: Never married  Intimate Partner Violence: Not At Risk (09/09/2021)   Humiliation, Afraid, Rape, and Kick questionnaire     Fear of Current or Ex-Partner: No    Emotionally Abused: No    Physically Abused: No    Sexually Abused: No     Current Outpatient Medications:    Aromatic Inhalants (DECONGESTANT INHALER IN), Inhale into the lungs., Disp: , Rfl:    FLUoxetine (PROZAC) 20 MG capsule, Take 2 capsules (40 mg total) by mouth daily., Disp: 60 capsule, Rfl: 0   furosemide (LASIX) 20 MG tablet, Take 1 tablet (20 mg total) by mouth daily., Disp: 90 tablet, Rfl: 3   meclizine (ANTIVERT) 25 MG tablet, Take 25 mg by mouth 3 (three) times daily as needed for dizziness., Disp: , Rfl:    metoprolol tartrate (LOPRESSOR) 25 MG tablet, Take 1 tablet (25 mg total) by mouth 2 (two) times daily., Disp: 180 tablet, Rfl: 3   pantoprazole (PROTONIX) 40 MG tablet, Take 1 tablet (40 mg total) by mouth daily., Disp: 90 tablet, Rfl: 3   potassium chloride SA (KLOR-CON M) 20 MEQ tablet, Take 1 tablet (20 mEq total) by mouth 2 (two) times daily., Disp: 180 tablet, Rfl: 3   atorvastatin (LIPITOR) 40 MG tablet, Take 0.5 tablets (20 mg total) by mouth daily., Disp: 90 tablet, Rfl: 3   lisinopril (ZESTRIL) 40 MG tablet, Take 0.5 tablets (20 mg total) by mouth daily., Disp: 45 tablet, Rfl: 3   warfarin (COUMADIN) 4 MG tablet, Take 1 tablet (4 mg total) by mouth daily. Take 1 daily Monday  through Friday. Do not take Saturday and Sunday.Take 4 mg by mouth daily., Disp: 30 tablet, Rfl: 6   No Known Allergies  ROS Review of Systems  Constitutional: Negative.   HENT: Negative.    Eyes: Negative.   Respiratory: Negative.    Cardiovascular: Negative.   Gastrointestinal: Negative.   Endocrine: Negative.   Genitourinary: Negative.   Musculoskeletal: Negative.   Skin: Negative.   Allergic/Immunologic: Negative.   Neurological: Negative.   Hematological: Negative.   Psychiatric/Behavioral: Negative.    All other systems reviewed and are negative.     Objective:    Physical Exam Vitals reviewed.  Constitutional:       Appearance: Normal appearance.  HENT:     Mouth/Throat:     Mouth: Mucous membranes are moist.  Eyes:     Pupils: Pupils are equal, round, and reactive to light.  Neck:     Vascular: No carotid bruit.  Cardiovascular:     Rate and Rhythm: Normal rate and regular rhythm.     Pulses: Normal pulses.     Heart sounds: Normal heart sounds.  Pulmonary:     Effort: Pulmonary effort is normal.     Breath sounds: Normal breath sounds.  Abdominal:  General: Bowel sounds are normal.     Palpations: Abdomen is soft. There is no hepatomegaly, splenomegaly or mass.     Tenderness: There is no abdominal tenderness.     Hernia: No hernia is present.  Musculoskeletal:     Cervical back: Neck supple.     Right lower leg: No edema.     Left lower leg: No edema.  Skin:    Findings: No rash.  Neurological:     Mental Status: He is alert and oriented to person, place, and time.     Motor: No weakness.  Psychiatric:        Mood and Affect: Mood normal.        Behavior: Behavior normal.     BP (!) 107/59   Pulse (!) 47   Ht 6\' 1"  (1.854 m)   Wt 225 lb (102.1 kg)   BMI 29.69 kg/m  Wt Readings from Last 3 Encounters:  11/28/21 225 lb (102.1 kg)  11/15/21 225 lb (102.1 kg)  11/14/21 225 lb 3.2 oz (102.2 kg)     Health Maintenance Due  Topic Date Due   Pneumonia Vaccine 61+ Years old (2 - PCV) 05/14/2016   COVID-19 Vaccine (2 - Moderna series) 11/24/2019    There are no preventive care reminders to display for this patient.  Lab Results  Component Value Date   TSH 1.78 12/19/2019   Lab Results  Component Value Date   WBC 5.7 10/31/2021   HGB 13.8 10/31/2021   HCT 40.7 10/31/2021   MCV 101.2 (H) 10/31/2021   PLT 195 10/31/2021   Lab Results  Component Value Date   NA 141 10/31/2021   K 4.1 10/31/2021   CO2 23 10/31/2021   GLUCOSE 115 (H) 10/31/2021   BUN 14 10/31/2021   CREATININE 0.83 10/31/2021   BILITOT 0.5 12/19/2019   ALKPHOS 71 02/16/2017   AST 13 12/19/2019    ALT 20 12/19/2019   PROT 7.4 12/19/2019   ALBUMIN 3.7 02/16/2017   CALCIUM 9.0 10/31/2021   ANIONGAP 8 10/31/2021   Lab Results  Component Value Date   CHOL 235 (H) 12/19/2019   Lab Results  Component Value Date   HDL 46 12/19/2019   Lab Results  Component Value Date   LDLCALC 160 (H) 12/19/2019   Lab Results  Component Value Date   TRIG 155 (H) 12/19/2019   Lab Results  Component Value Date   CHOLHDL 5.1 (H) 12/19/2019   Lab Results  Component Value Date   HGBA1C 5.5 05/24/2015      Assessment & Plan:   Problem List Items Addressed This Visit       Cardiovascular and Mediastinum   Essential Gregory    Patient blood pressure is normal so we will stop the amlodipine decreased  Lisinopril to  20 mg po daily and metoprolol as before  we will see him back in 2 weeks if his blood pressure is still low we may have to stop lisinopril Decrease prozac  To 20 mg po daily      Relevant Medications   warfarin (COUMADIN) 4 MG tablet   atorvastatin (LIPITOR) 40 MG tablet   lisinopril (ZESTRIL) 40 MG tablet   Atrial flutter with rapid ventricular response (HCC) - Primary    protime Today is going to be 25.0, I asked the patient to take his Coumadin 6 days a week then 5 days a week      Relevant Medications   warfarin (COUMADIN) 4 MG  tablet   atorvastatin (LIPITOR) 40 MG tablet   lisinopril (ZESTRIL) 40 MG tablet   Other Relevant Orders   POCT INR (Completed)     Respiratory   Chronic obstructive pulmonary disease (COPD) ([place])    - I instructed the patient to stop smoking and provided them with smoking cessation materials.  - I informed the patient that smoking puts them at increased risk for cancer, COPD, Gregory, and more.  - Informed the patient to seek help if they begin to have trouble breathing, develop chest pain, start to cough up blood, feel faint, or pass out.        Digestive   GERD without esophagitis    - The patient's GERD is stable on  medication.  - Instructed the patient to avoid eating spicy and acidic foods, as well as foods high in fat. - Instructed the patient to avoid eating large meals or meals 2-3 hours prior to sleeping.       Meds ordered this encounter  Medications   warfarin (COUMADIN) 4 MG tablet    Sig: Take 1 tablet (4 mg total) by mouth daily. Take 1 daily Monday  through Friday. Do not take Saturday and Sunday.Take 4 mg by mouth daily.    Dispense:  30 tablet    Refill:  6   atorvastatin (LIPITOR) 40 MG tablet    Sig: Take 0.5 tablets (20 mg total) by mouth daily.    Dispense:  90 tablet    Refill:  3   lisinopril (ZESTRIL) 40 MG tablet    Sig: Take 0.5 tablets (20 mg total) by mouth daily.    Dispense:  45 tablet    Refill:  3    Follow-up: No follow-ups on file.    Cletis Athens, MD

## 2021-11-28 NOTE — Assessment & Plan Note (Signed)
-   The patient's GERD is stable on medication.  - Instructed the patient to avoid eating spicy and acidic foods, as well as foods high in fat. - Instructed the patient to avoid eating large meals or meals 2-3 hours prior to sleeping. 

## 2021-11-30 DIAGNOSIS — E785 Hyperlipidemia, unspecified: Secondary | ICD-10-CM | POA: Diagnosis not present

## 2021-11-30 DIAGNOSIS — I70203 Unspecified atherosclerosis of native arteries of extremities, bilateral legs: Secondary | ICD-10-CM | POA: Diagnosis not present

## 2021-11-30 DIAGNOSIS — Z48812 Encounter for surgical aftercare following surgery on the circulatory system: Secondary | ICD-10-CM | POA: Diagnosis not present

## 2021-11-30 DIAGNOSIS — N189 Chronic kidney disease, unspecified: Secondary | ICD-10-CM | POA: Diagnosis not present

## 2021-11-30 DIAGNOSIS — Z7901 Long term (current) use of anticoagulants: Secondary | ICD-10-CM | POA: Diagnosis not present

## 2021-11-30 DIAGNOSIS — I69351 Hemiplegia and hemiparesis following cerebral infarction affecting right dominant side: Secondary | ICD-10-CM | POA: Diagnosis not present

## 2021-11-30 DIAGNOSIS — I129 Hypertensive chronic kidney disease with stage 1 through stage 4 chronic kidney disease, or unspecified chronic kidney disease: Secondary | ICD-10-CM | POA: Diagnosis not present

## 2021-11-30 DIAGNOSIS — I4892 Unspecified atrial flutter: Secondary | ICD-10-CM | POA: Diagnosis not present

## 2021-11-30 DIAGNOSIS — J449 Chronic obstructive pulmonary disease, unspecified: Secondary | ICD-10-CM | POA: Diagnosis not present

## 2021-11-30 DIAGNOSIS — I739 Peripheral vascular disease, unspecified: Secondary | ICD-10-CM | POA: Diagnosis not present

## 2021-12-06 DIAGNOSIS — K219 Gastro-esophageal reflux disease without esophagitis: Secondary | ICD-10-CM | POA: Diagnosis not present

## 2021-12-06 DIAGNOSIS — I6389 Other cerebral infarction: Secondary | ICD-10-CM | POA: Diagnosis not present

## 2021-12-06 DIAGNOSIS — I509 Heart failure, unspecified: Secondary | ICD-10-CM | POA: Diagnosis not present

## 2021-12-06 DIAGNOSIS — I739 Peripheral vascular disease, unspecified: Secondary | ICD-10-CM | POA: Diagnosis not present

## 2021-12-06 DIAGNOSIS — J449 Chronic obstructive pulmonary disease, unspecified: Secondary | ICD-10-CM | POA: Diagnosis not present

## 2021-12-06 DIAGNOSIS — I2510 Atherosclerotic heart disease of native coronary artery without angina pectoris: Secondary | ICD-10-CM | POA: Diagnosis not present

## 2021-12-06 DIAGNOSIS — I4892 Unspecified atrial flutter: Secondary | ICD-10-CM | POA: Diagnosis not present

## 2021-12-06 DIAGNOSIS — I340 Nonrheumatic mitral (valve) insufficiency: Secondary | ICD-10-CM | POA: Diagnosis not present

## 2021-12-06 DIAGNOSIS — E669 Obesity, unspecified: Secondary | ICD-10-CM | POA: Diagnosis not present

## 2021-12-06 DIAGNOSIS — F17200 Nicotine dependence, unspecified, uncomplicated: Secondary | ICD-10-CM | POA: Diagnosis not present

## 2021-12-11 ENCOUNTER — Other Ambulatory Visit: Payer: Self-pay | Admitting: Internal Medicine

## 2021-12-12 ENCOUNTER — Ambulatory Visit (INDEPENDENT_AMBULATORY_CARE_PROVIDER_SITE_OTHER): Payer: No Typology Code available for payment source | Admitting: Internal Medicine

## 2021-12-12 ENCOUNTER — Encounter: Payer: Self-pay | Admitting: Internal Medicine

## 2021-12-12 VITALS — BP 127/62 | HR 52 | Ht 73.0 in | Wt 224.2 lb

## 2021-12-12 DIAGNOSIS — I10 Essential (primary) hypertension: Secondary | ICD-10-CM

## 2021-12-12 DIAGNOSIS — I2510 Atherosclerotic heart disease of native coronary artery without angina pectoris: Secondary | ICD-10-CM | POA: Diagnosis not present

## 2021-12-12 DIAGNOSIS — I4891 Unspecified atrial fibrillation: Secondary | ICD-10-CM

## 2021-12-12 DIAGNOSIS — I700 Atherosclerosis of aorta: Secondary | ICD-10-CM | POA: Diagnosis not present

## 2021-12-12 DIAGNOSIS — F17210 Nicotine dependence, cigarettes, uncomplicated: Secondary | ICD-10-CM

## 2021-12-12 DIAGNOSIS — E8881 Metabolic syndrome: Secondary | ICD-10-CM | POA: Diagnosis not present

## 2021-12-12 LAB — POCT INR: INR: 2.7 (ref 2.0–3.0)

## 2021-12-12 MED ORDER — FLUOXETINE HCL 20 MG PO CAPS: 40.0000 mg | ORAL_CAPSULE | Freq: Every day | ORAL | 1 refills | Status: DC

## 2021-12-12 MED ORDER — WARFARIN SODIUM 4 MG PO TABS: 4.0000 mg | ORAL_TABLET | Freq: Every day | ORAL | 6 refills | Status: DC

## 2021-12-12 NOTE — Assessment & Plan Note (Signed)
-   I instructed the patient to stop smoking and provided them with smoking cessation materials.  - I informed the patient that smoking puts them at increased risk for cancer, COPD, hypertension, and more.  - Informed the patient to seek help if they begin to have trouble breathing, develop chest pain, start to cough up blood, feel faint, or pass out.  

## 2021-12-12 NOTE — Assessment & Plan Note (Signed)
Hypercholesterolemia  I advised the patient to follow Mediterranean diet This diet is rich in fruits vegetables and whole grain, and This diet is also rich in fish and lean meat Patient should also eat a handful of almonds or walnuts daily Recent heart study indicated that average follow-up on this kind of diet reduces the cardiovascular mortality by 50 to 70%== 

## 2021-12-12 NOTE — Assessment & Plan Note (Signed)
Patient does not have any angina, congestive heart failure is under control, ejection fraction in the past 55%

## 2021-12-12 NOTE — Assessment & Plan Note (Signed)
Blood pressure stable ? ?

## 2021-12-12 NOTE — Assessment & Plan Note (Signed)
Patient was advised to lose weight 

## 2021-12-12 NOTE — Progress Notes (Signed)
Established Patient Office Visit  Subjective:  Patient ID: Gregory Lane, male    DOB: [birth year]  Age: 71 y.o. MRN: [id number]  CC:  Chief Complaint  Patient presents with   Atrial Fibrillation    Atrial Fibrillation Past medical history includes atrial fibrillation.    [name] presents for inr check  Past Medical History:  Diagnosis Date   Aortic atherosclerosis ([place]) 02/16/2017   Arthritis    Blind left eye    Hypertension    Low TSH level 02/17/2017   Myocardial infarction [facility])    Perforated duodenal ulcer ([place])    Stroke [facility])     Past Surgical History:  Procedure Laterality Date   EXPLORATORY LAPAROTOMY  05/12/2015   EYE SURGERY     FRACTURE SURGERY     LAPAROTOMY N/A 05/10/2015   Procedure: EXPLORATORY LAPAROTOMY;  Surgeon: Ralene Ok, MD;  Location: [place];  Service: General;  Laterality: N/A;   LEFT HEART CATH AND CORONARY ANGIOGRAPHY N/A 05/02/2016   Procedure: Left Heart Cath and Coronary Angiography;  Surgeon: Adrian Prows, MD;  Location: [place] [facility];  Service: Cardiovascular;  Laterality: N/A;   LOWER EXTREMITY ANGIOGRAPHY Left 09/21/2021   Procedure: Lower Extremity Angiography;  Surgeon: Katha Cabal, MD;  Location: [place] [facility];  Service: Cardiovascular;  Laterality: Left;    Family History  Problem Relation Age of Onset   Cirrhosis Father    Hypertension Mother    Lung cancer Sister    Hypertension Sister    Arthritis Sister    Heart murmur Sister     Social History   Socioeconomic History   Marital status: Single    Spouse name: Not on file   Number of children: Not on file   Years of education: Not on file   Highest education level: Not on file  Occupational History   Not on file  Tobacco Use   Smoking status: Every Day    Packs/day: 1.00    Years: 60.00    Total pack years: 60.00    Types: Cigarettes   Smokeless tobacco: Never  Substance and Sexual Activity   Alcohol use: No    Alcohol/week:  60.0 standard drinks of alcohol    Types: 60 Standard drinks or equivalent per week    Comment: daily 12 pk of beer   Drug use: No   Sexual activity: Not Currently  Other Topics Concern   Not on file  Social History Narrative   [place] Veteran. Lives alone   Social Determinants of Health   Financial Resource Strain: Low Risk  (09/09/2021)   Overall Financial Resource Strain (CARDIA)    Difficulty of Paying Living Expenses: Not very hard  Food Insecurity: No Food Insecurity (09/09/2021)   Hunger Vital Sign    Worried About Running Out of Food in the Last Year: Never true    Ran Out of Food in the Last Year: Never true  Transportation Needs: No Transportation Needs (09/09/2021)   PRAPARE - Hydrologist (Medical): No    Lack of Transportation (Non-Medical): No  Physical Activity: Inactive (09/09/2021)   Exercise Vital Sign    Days of Exercise per Week: 0 days    Minutes of Exercise per Session: 0 min  Stress: No Stress Concern Present (09/09/2021)   [place]    Feeling of Stress : Only a little  Social Connections: Socially Isolated (09/09/2021)  Social Licensed conveyancer [NHANES]    Frequency of Communication with Friends and Family: More than three times a week    Frequency of Social Gatherings with Friends and Family: More than three times a week    Attends Religious Services: Never    Marine scientist or Organizations: No    Attends Archivist Meetings: Never    Marital Status: Never married  Intimate Partner Violence: Not At Risk (09/09/2021)   Humiliation, Afraid, Rape, and Kick questionnaire    Fear of Current or Ex-Partner: No    Emotionally Abused: No    Physically Abused: No    Sexually Abused: No     Current Outpatient Medications:    Aromatic Inhalants (DECONGESTANT INHALER IN), Inhale into the lungs., Disp: , Rfl:    atorvastatin (LIPITOR) 40  MG tablet, Take 0.5 tablets (20 mg total) by mouth daily., Disp: 90 tablet, Rfl: 3   furosemide (LASIX) 20 MG tablet, Take 1 tablet (20 mg total) by mouth daily., Disp: 90 tablet, Rfl: 3   lisinopril (ZESTRIL) 40 MG tablet, Take 0.5 tablets (20 mg total) by mouth daily., Disp: 45 tablet, Rfl: 3   meclizine (ANTIVERT) 25 MG tablet, Take 25 mg by mouth 3 (three) times daily as needed for dizziness., Disp: , Rfl:    metoprolol tartrate (LOPRESSOR) 25 MG tablet, Take 1 tablet (25 mg total) by mouth 2 (two) times daily., Disp: 180 tablet, Rfl: 3   pantoprazole (PROTONIX) 40 MG tablet, Take 1 tablet (40 mg total) by mouth daily., Disp: 90 tablet, Rfl: 3   potassium chloride SA (KLOR-CON M) 20 MEQ tablet, Take 1 tablet (20 mEq total) by mouth 2 (two) times daily., Disp: 180 tablet, Rfl: 3   FLUoxetine (PROZAC) 20 MG capsule, Take 2 capsules (40 mg total) by mouth daily., Disp: 60 capsule, Rfl: 1   warfarin (COUMADIN) 4 MG tablet, Take 1 tablet (4 mg total) by mouth daily. Take 1 daily Monday  through Friday. Do not take Saturday and Sunday.Take 4 mg by mouth daily., Disp: 30 tablet, Rfl: 6   No Known Allergies  ROS Review of Systems  Constitutional: Negative.   HENT: Negative.    Eyes: Negative.   Respiratory: Negative.    Cardiovascular: Negative.   Gastrointestinal: Negative.   Endocrine: Negative.   Genitourinary: Negative.   Musculoskeletal: Negative.   Skin: Negative.   Allergic/Immunologic: Negative.   Neurological: Negative.   Hematological: Negative.   Psychiatric/Behavioral: Negative.    All other systems reviewed and are negative.     Objective:    Physical Exam Vitals reviewed.  Constitutional:      Appearance: Normal appearance.  HENT:     Mouth/Throat:     Mouth: Mucous membranes are moist.  Eyes:     Pupils: Pupils are equal, round, and reactive to light.  Neck:     Vascular: No carotid bruit.  Cardiovascular:     Rate and Rhythm: Normal rate and regular rhythm.      Pulses: Normal pulses.     Heart sounds: Normal heart sounds.  Pulmonary:     Effort: Pulmonary effort is normal.     Breath sounds: Normal breath sounds.  Abdominal:     General: Bowel sounds are normal.     Palpations: Abdomen is soft. There is no hepatomegaly, splenomegaly or mass.     Tenderness: There is no abdominal tenderness.     Hernia: No hernia is present.  Musculoskeletal:  Cervical back: Neck supple.     Right lower leg: No edema.     Left lower leg: No edema.  Skin:    Findings: No rash.  Neurological:     Mental Status: He is alert and oriented to person, place, and time.     Motor: No weakness.  Psychiatric:        Mood and Affect: Mood normal.        Behavior: Behavior normal.     BP 127/62   Pulse (!) 52   Ht 6\' 1"  (1.854 m)   Wt 224 lb 3.2 oz (101.7 kg)   BMI 29.58 kg/m  Wt Readings from Last 3 Encounters:  12/12/21 224 lb 3.2 oz (101.7 kg)  11/28/21 225 lb (102.1 kg)  11/15/21 225 lb (102.1 kg)     Health Maintenance Due  Topic Date Due   Pneumonia Vaccine 77+ Years old (2 - PCV) 05/14/2016   COVID-19 Vaccine (2 - Moderna series) 11/24/2019    There are no preventive care reminders to display for this patient.  Lab Results  Component Value Date   TSH 1.78 12/19/2019   Lab Results  Component Value Date   WBC 5.7 10/31/2021   HGB 13.8 10/31/2021   HCT 40.7 10/31/2021   MCV 101.2 (H) 10/31/2021   PLT 195 10/31/2021   Lab Results  Component Value Date   NA 141 10/31/2021   K 4.1 10/31/2021   CO2 23 10/31/2021   GLUCOSE 115 (H) 10/31/2021   BUN 14 10/31/2021   CREATININE 0.83 10/31/2021   BILITOT 0.5 12/19/2019   ALKPHOS 71 02/16/2017   AST 13 12/19/2019   ALT 20 12/19/2019   PROT 7.4 12/19/2019   ALBUMIN 3.7 02/16/2017   CALCIUM 9.0 10/31/2021   ANIONGAP 8 10/31/2021   Lab Results  Component Value Date   CHOL 235 (H) 12/19/2019   Lab Results  Component Value Date   HDL 46 12/19/2019   Lab Results  Component  Value Date   LDLCALC 160 (H) 12/19/2019   Lab Results  Component Value Date   TRIG 155 (H) 12/19/2019   Lab Results  Component Value Date   CHOLHDL 5.1 (H) 12/19/2019   Lab Results  Component Value Date   HGBA1C 5.5 05/24/2015      Assessment & Plan:  Pro time is therapeutic.  Continue the same dose of Coumadin Problem List Items Addressed This Visit   None Visit Diagnoses     Atrial fibrillation, unspecified type ([place])    -  Primary   Relevant Medications   warfarin (COUMADIN) 4 MG tablet   Other Relevant Orders   POCT INR (Completed)       Meds ordered this encounter  Medications   warfarin (COUMADIN) 4 MG tablet    Sig: Take 1 tablet (4 mg total) by mouth daily. Take 1 daily Monday  through Friday. Do not take Saturday and Sunday.Take 4 mg by mouth daily.    Dispense:  30 tablet    Refill:  6   FLUoxetine (PROZAC) 20 MG capsule    Sig: Take 2 capsules (40 mg total) by mouth daily.    Dispense:  60 capsule    Refill:  1    Follow-up: No follow-ups on file.    Cletis Athens, MD

## 2022-01-02 DIAGNOSIS — R55 Syncope and collapse: Secondary | ICD-10-CM | POA: Diagnosis not present

## 2022-01-02 DIAGNOSIS — I739 Peripheral vascular disease, unspecified: Secondary | ICD-10-CM | POA: Diagnosis not present

## 2022-01-02 DIAGNOSIS — E669 Obesity, unspecified: Secondary | ICD-10-CM | POA: Diagnosis not present

## 2022-01-02 DIAGNOSIS — R0602 Shortness of breath: Secondary | ICD-10-CM | POA: Diagnosis not present

## 2022-01-02 DIAGNOSIS — I509 Heart failure, unspecified: Secondary | ICD-10-CM | POA: Diagnosis not present

## 2022-01-02 DIAGNOSIS — I4892 Unspecified atrial flutter: Secondary | ICD-10-CM | POA: Diagnosis not present

## 2022-01-02 DIAGNOSIS — J449 Chronic obstructive pulmonary disease, unspecified: Secondary | ICD-10-CM | POA: Diagnosis not present

## 2022-01-02 DIAGNOSIS — F17200 Nicotine dependence, unspecified, uncomplicated: Secondary | ICD-10-CM | POA: Diagnosis not present

## 2022-01-02 DIAGNOSIS — I6389 Other cerebral infarction: Secondary | ICD-10-CM | POA: Diagnosis not present

## 2022-01-02 DIAGNOSIS — I340 Nonrheumatic mitral (valve) insufficiency: Secondary | ICD-10-CM | POA: Diagnosis not present

## 2022-01-02 DIAGNOSIS — R079 Chest pain, unspecified: Secondary | ICD-10-CM | POA: Diagnosis not present

## 2022-01-02 DIAGNOSIS — K219 Gastro-esophageal reflux disease without esophagitis: Secondary | ICD-10-CM | POA: Diagnosis not present

## 2022-01-10 ENCOUNTER — Ambulatory Visit: Payer: No Typology Code available for payment source | Admitting: Internal Medicine

## 2022-01-16 DIAGNOSIS — I6389 Other cerebral infarction: Secondary | ICD-10-CM | POA: Diagnosis not present

## 2022-01-16 DIAGNOSIS — I340 Nonrheumatic mitral (valve) insufficiency: Secondary | ICD-10-CM | POA: Diagnosis not present

## 2022-01-16 DIAGNOSIS — F17200 Nicotine dependence, unspecified, uncomplicated: Secondary | ICD-10-CM | POA: Diagnosis not present

## 2022-01-16 DIAGNOSIS — I509 Heart failure, unspecified: Secondary | ICD-10-CM | POA: Diagnosis not present

## 2022-01-16 DIAGNOSIS — R55 Syncope and collapse: Secondary | ICD-10-CM | POA: Diagnosis not present

## 2022-01-16 DIAGNOSIS — I4892 Unspecified atrial flutter: Secondary | ICD-10-CM | POA: Diagnosis not present

## 2022-01-16 DIAGNOSIS — I739 Peripheral vascular disease, unspecified: Secondary | ICD-10-CM | POA: Diagnosis not present

## 2022-01-16 DIAGNOSIS — E669 Obesity, unspecified: Secondary | ICD-10-CM | POA: Diagnosis not present

## 2022-01-16 DIAGNOSIS — J449 Chronic obstructive pulmonary disease, unspecified: Secondary | ICD-10-CM | POA: Diagnosis not present

## 2022-01-16 DIAGNOSIS — K219 Gastro-esophageal reflux disease without esophagitis: Secondary | ICD-10-CM | POA: Diagnosis not present

## 2022-01-19 ENCOUNTER — Other Ambulatory Visit: Payer: Self-pay | Admitting: Cardiology

## 2022-01-19 DIAGNOSIS — R911 Solitary pulmonary nodule: Secondary | ICD-10-CM

## 2022-01-19 DIAGNOSIS — R931 Abnormal findings on diagnostic imaging of heart and coronary circulation: Secondary | ICD-10-CM

## 2022-01-20 DIAGNOSIS — E669 Obesity, unspecified: Secondary | ICD-10-CM | POA: Diagnosis not present

## 2022-01-20 DIAGNOSIS — I739 Peripheral vascular disease, unspecified: Secondary | ICD-10-CM | POA: Diagnosis not present

## 2022-01-20 DIAGNOSIS — I4892 Unspecified atrial flutter: Secondary | ICD-10-CM | POA: Diagnosis not present

## 2022-01-20 DIAGNOSIS — F17200 Nicotine dependence, unspecified, uncomplicated: Secondary | ICD-10-CM | POA: Diagnosis not present

## 2022-01-20 DIAGNOSIS — K219 Gastro-esophageal reflux disease without esophagitis: Secondary | ICD-10-CM | POA: Diagnosis not present

## 2022-01-20 DIAGNOSIS — I509 Heart failure, unspecified: Secondary | ICD-10-CM | POA: Diagnosis not present

## 2022-01-20 DIAGNOSIS — J449 Chronic obstructive pulmonary disease, unspecified: Secondary | ICD-10-CM | POA: Diagnosis not present

## 2022-01-20 DIAGNOSIS — I340 Nonrheumatic mitral (valve) insufficiency: Secondary | ICD-10-CM | POA: Diagnosis not present

## 2022-01-23 ENCOUNTER — Ambulatory Visit (INDEPENDENT_AMBULATORY_CARE_PROVIDER_SITE_OTHER): Payer: No Typology Code available for payment source | Admitting: Internal Medicine

## 2022-01-23 ENCOUNTER — Encounter: Payer: Self-pay | Admitting: Internal Medicine

## 2022-01-23 ENCOUNTER — Ambulatory Visit: Payer: Self-pay | Admitting: *Deleted

## 2022-01-23 VITALS — BP 145/81 | HR 58 | Temp 97.7°F | Ht 73.0 in | Wt 224.6 lb

## 2022-01-23 DIAGNOSIS — I700 Atherosclerosis of aorta: Secondary | ICD-10-CM | POA: Diagnosis not present

## 2022-01-23 DIAGNOSIS — F17210 Nicotine dependence, cigarettes, uncomplicated: Secondary | ICD-10-CM

## 2022-01-23 DIAGNOSIS — I10 Essential (primary) hypertension: Secondary | ICD-10-CM

## 2022-01-23 DIAGNOSIS — J441 Chronic obstructive pulmonary disease with (acute) exacerbation: Secondary | ICD-10-CM

## 2022-01-23 DIAGNOSIS — K219 Gastro-esophageal reflux disease without esophagitis: Secondary | ICD-10-CM

## 2022-01-23 NOTE — Assessment & Plan Note (Signed)
-   The patient's GERD is stable on medication.  - Instructed the patient to avoid eating spicy and acidic foods, as well as foods high in fat. - Instructed the patient to avoid eating large meals or meals 2-3 hours prior to sleeping. 

## 2022-01-23 NOTE — Assessment & Plan Note (Signed)
Advised to follow low cholesterol diet 

## 2022-01-23 NOTE — Progress Notes (Signed)
Established Patient Office Visit  Subjective:  Patient ID: Gregory Lane, Gregory Lane    DOB: [birth year]  Age: 71 y.o. MRN: [id number]  CC:  Chief Complaint  Patient presents with   Follow-up    He saw cardio and they took him off a couple of medications.     HPI  Gregory Lane presents for check up  Past Medical History:  Diagnosis Date   Aortic atherosclerosis ([place]) 02/16/2017   Arthritis    Blind left eye    Hypertension    Low TSH level 02/17/2017   Myocardial infarction [facility])    Perforated duodenal ulcer ([place])    Stroke [facility])     Past Surgical History:  Procedure Laterality Date   EXPLORATORY LAPAROTOMY  05/12/2015   EYE SURGERY     FRACTURE SURGERY     LAPAROTOMY N/A 05/10/2015   Procedure: EXPLORATORY LAPAROTOMY;  Surgeon: Ralene Ok, MD;  Location: [place];  Service: General;  Laterality: N/A;   LEFT HEART CATH AND CORONARY ANGIOGRAPHY N/A 05/02/2016   Procedure: Left Heart Cath and Coronary Angiography;  Surgeon: Adrian Prows, MD;  Location: [place] [facility];  Service: Cardiovascular;  Laterality: N/A;   LOWER EXTREMITY ANGIOGRAPHY Left 09/21/2021   Procedure: Lower Extremity Angiography;  Surgeon: Katha Cabal, MD;  Location: [place] [facility];  Service: Cardiovascular;  Laterality: Left;    Family History  Problem Relation Age of Onset   Cirrhosis Father    Hypertension Mother    Lung cancer Sister    Hypertension Sister    Arthritis Sister    Heart murmur Sister     Social History   Socioeconomic History   Marital status: Single    Spouse name: Not on file   Number of children: Not on file   Years of education: Not on file   Highest education level: Not on file  Occupational History   Not on file  Tobacco Use   Smoking status: Every Day    Packs/day: 1.00    Years: 60.00    Total pack years: 60.00    Types: Cigarettes   Smokeless tobacco: Never  Substance and Sexual Activity   Alcohol use: No    Alcohol/week: 60.0 standard  drinks of alcohol    Types: 60 Standard drinks or equivalent per week    Comment: daily 12 pk of beer   Drug use: No   Sexual activity: Not Currently  Other Topics Concern   Not on file  Social History Narrative   [place] Veteran. Lives alone   Social Determinants of Health   Financial Resource Strain: Low Risk  (09/09/2021)   Overall Financial Resource Strain (CARDIA)    Difficulty of Paying Living Expenses: Not very hard  Food Insecurity: No Food Insecurity (09/09/2021)   Hunger Vital Sign    Worried About Running Out of Food in the Last Year: Never true    Ran Out of Food in the Last Year: Never true  Transportation Needs: No Transportation Needs (09/09/2021)   PRAPARE - Hydrologist (Medical): No    Lack of Transportation (Non-Medical): No  Physical Activity: Inactive (09/09/2021)   Exercise Vital Sign    Days of Exercise per Week: 0 days    Minutes of Exercise per Session: 0 min  Stress: No Stress Concern Present (09/09/2021)   [place]    Feeling of Stress : Only a little  Social  Connections: Socially Isolated (09/09/2021)   Social Connection and Isolation Panel [NHANES]    Frequency of Communication with Friends and Family: More than three times a week    Frequency of Social Gatherings with Friends and Family: More than three times a week    Attends Religious Services: Never    Marine scientist or Organizations: No    Attends Archivist Meetings: Never    Marital Status: Never married  Intimate Partner Violence: Not At Risk (09/09/2021)   Humiliation, Afraid, Rape, and Kick questionnaire    Fear of Current or Ex-Partner: No    Emotionally Abused: No    Physically Abused: No    Sexually Abused: No     Current Outpatient Medications:    atorvastatin (LIPITOR) 40 MG tablet, Take 0.5 tablets (20 mg total) by mouth daily. (Patient taking differently: Take 40 mg by  mouth daily.), Disp: 90 tablet, Rfl: 3   FLUoxetine (PROZAC) 20 MG capsule, Take 2 capsules (40 mg total) by mouth daily., Disp: 60 capsule, Rfl: 1   pantoprazole (PROTONIX) 40 MG tablet, Take 1 tablet (40 mg total) by mouth daily., Disp: 90 tablet, Rfl: 3   warfarin (COUMADIN) 4 MG tablet, Take 1 tablet (4 mg total) by mouth daily. Take 1 daily Monday  through Friday. Do not take Saturday and Sunday.Take 4 mg by mouth daily., Disp: 30 tablet, Rfl: 6   Aromatic Inhalants (DECONGESTANT INHALER IN), Inhale into the lungs., Disp: , Rfl:    meclizine (ANTIVERT) 25 MG tablet, Take 25 mg by mouth 3 (three) times daily as needed for dizziness., Disp: , Rfl:    No Known Allergies  ROS Review of Systems  Constitutional: Negative.   HENT: Negative.    Eyes: Negative.   Respiratory: Negative.    Cardiovascular: Negative.   Gastrointestinal: Negative.   Endocrine: Negative.   Genitourinary: Negative.   Musculoskeletal: Negative.   Skin: Negative.   Allergic/Immunologic: Negative.   Neurological: Negative.   Hematological: Negative.   Psychiatric/Behavioral: Negative.    All other systems reviewed and are negative.     Objective:    Physical Exam Vitals reviewed.  Constitutional:      Appearance: Normal appearance.  HENT:     Mouth/Throat:     Mouth: Mucous membranes are moist.  Eyes:     Pupils: Pupils are equal, round, and reactive to light.  Neck:     Vascular: No carotid bruit.  Cardiovascular:     Rate and Rhythm: Normal rate and regular rhythm.     Pulses: Normal pulses.     Heart sounds: Normal heart sounds.  Pulmonary:     Effort: Pulmonary effort is normal.     Breath sounds: Normal breath sounds.  Abdominal:     General: Bowel sounds are normal.     Palpations: Abdomen is soft. There is no hepatomegaly, splenomegaly or mass.     Tenderness: There is no abdominal tenderness.     Hernia: No hernia is present.  Musculoskeletal:     Cervical back: Neck supple.      Right lower leg: No edema.     Left lower leg: No edema.  Skin:    Findings: No rash.  Neurological:     Mental Status: He is alert and oriented to person, place, and time.     Motor: No weakness.  Psychiatric:        Mood and Affect: Mood normal.        Behavior: Behavior  normal.     BP (!) 145/81   Pulse (!) 58   Temp 97.7 F (36.5 C) (Temporal)   Ht 6\' 1"  (1.854 m)   Wt 224 lb 9.6 oz (101.9 kg)   SpO2 97%   BMI 29.63 kg/m  Wt Readings from Last 3 Encounters:  01/23/22 224 lb 9.6 oz (101.9 kg)  12/12/21 224 lb 3.2 oz (101.7 kg)  11/28/21 225 lb (102.1 kg)     Health Maintenance Due  Topic Date Due   Lung Cancer Screening  02/16/2018    There are no preventive care reminders to display for this patient.  Lab Results  Component Value Date   TSH 1.78 12/19/2019   Lab Results  Component Value Date   WBC 5.7 10/31/2021   HGB 13.8 10/31/2021   HCT 40.7 10/31/2021   MCV 101.2 (H) 10/31/2021   PLT 195 10/31/2021   Lab Results  Component Value Date   NA 141 10/31/2021   K 4.1 10/31/2021   CO2 23 10/31/2021   GLUCOSE 115 (H) 10/31/2021   BUN 14 10/31/2021   CREATININE 0.83 10/31/2021   BILITOT 0.5 12/19/2019   ALKPHOS 71 02/16/2017   AST 13 12/19/2019   ALT 20 12/19/2019   PROT 7.4 12/19/2019   ALBUMIN 3.7 02/16/2017   CALCIUM 9.0 10/31/2021   ANIONGAP 8 10/31/2021   Lab Results  Component Value Date   CHOL 235 (H) 12/19/2019   Lab Results  Component Value Date   HDL 46 12/19/2019   Lab Results  Component Value Date   LDLCALC 160 (H) 12/19/2019   Lab Results  Component Value Date   TRIG 155 (H) 12/19/2019   Lab Results  Component Value Date   CHOLHDL 5.1 (H) 12/19/2019   Lab Results  Component Value Date   HGBA1C 5.5 05/24/2015      Assessment & Plan:   Problem List Items Addressed This Visit       Cardiovascular and Mediastinum   Essential hypertension - Primary    Blood pressure is stable      Aortic atherosclerosis (HCC)     Advised to follow low-cholesterol diet        Respiratory   COPD with acute exacerbation (HCC)     Digestive   GERD without esophagitis    - The patient's GERD is stable on medication.  - Instructed the patient to avoid eating spicy and acidic foods, as well as foods high in fat. - Instructed the patient to avoid eating large meals or meals 2-3 hours prior to sleeping.        Other   Cigarette smoker  - I instructed the patient to stop smoking and provided them with smoking cessation materials.  - I informed the patient that smoking puts them at increased risk for cancer, COPD, hypertension, and more.  - Informed the patient to seek help if they begin to have trouble breathing, develop chest pain, start to cough up blood, feel faint, or pass out.  No orders of the defined types were placed in this encounter.   Follow-up: No follow-ups on file.    Cletis Athens, MD

## 2022-01-23 NOTE — Assessment & Plan Note (Signed)
Blood pressure is stable 

## 2022-01-24 ENCOUNTER — Encounter (HOSPITAL_COMMUNITY): Payer: Self-pay | Admitting: Cardiology

## 2022-01-24 ENCOUNTER — Other Ambulatory Visit: Payer: Self-pay | Admitting: Cardiology

## 2022-01-24 DIAGNOSIS — R911 Solitary pulmonary nodule: Secondary | ICD-10-CM

## 2022-01-24 DIAGNOSIS — R931 Abnormal findings on diagnostic imaging of heart and coronary circulation: Secondary | ICD-10-CM

## 2022-01-24 NOTE — Patient Outreach (Signed)
  Care Coordination   Initial Visit Note   01/24/2022 Name: DASHUN BORRE MRN: [id number] DOB: 22-Sep-1950  Sinjin Amero Mcloughlin is a 71 y.o. year old male who sees Cletis Athens, MD for primary care. I spoke with  Dwan Bolt Hoglund by phone today.  What matters to the patients health and wellness today?  Meals on Wheels referral    Goals Addressed             This Visit's Progress    I would like meals on wheels       Care Coordination Interventions: Met with patient face to face in the provider's office to discuss needs Case Management program discussed SDOH screening completed Patient identified need for meals on wheels referral, patient resides with his sister and sister in law, however they do not always provide meals  Meals on wheels referral to be completed Patient identified no additional needs at this time and is agreeable to Meals on Wheels referral Eligibility to be determined by Meals on Wheels Depression screen reviewed  Active listening / Reflection utilized  Emotional Support Provided         SDOH assessments and interventions completed:  Yes  SDOH Interventions Today    Flowsheet Row Most Recent Value  SDOH Interventions   Food Insecurity Interventions Intervention Not Indicated  Housing Interventions Intervention Not Indicated  Transportation Interventions Intervention Not Indicated  Financial Strain Interventions Intervention Not Indicated  Physical Activity Interventions Intervention Not Indicated        Care Coordination Interventions:  Yes, provided   Follow up plan: Follow up call scheduled for 02/06/22    Encounter Outcome:  Pt. Visit Completed

## 2022-01-24 NOTE — Patient Instructions (Signed)
Visit Information  Thank you for taking time to visit with me today. Please don't hesitate to contact me if I can be of assistance to you.   Following are the goals we discussed today:   Goals Addressed             This Visit's Progress    I would like meals on wheels       Care Coordination Interventions: Met with patient face to face in the provider's office to discuss needs Case Management program discussed SDOH screening completed Patient identified need for meals on wheels referral, patient resides with his sister and sister in law, however they do not always provide meals  Meals on wheels referral to be completed Patient identified no additional needs at this time and is agreeable to Meals on Wheels referral Eligibility to be determined by Meals on Wheels Depression screen reviewed  Active listening / Reflection utilized  Emotional Support Provided         Our next appointment is by telephone on 02/06/22 at 1:30pm  Please call the care guide team at [phone] if you need to cancel or reschedule your appointment.   If you are experiencing a Mental Health or [place] or need someone to talk to, please call 911   Patient verbalizes understanding of instructions and care plan provided today and agrees to view in [place]. Active MyChart status and patient understanding of how to access instructions and care plan via MyChart confirmed with patient.     Telephone follow up appointment with care management team member scheduled for: 02/06/22  Elliot Gurney, [place] Worker  [facility] Care Management [phone]

## 2022-01-26 ENCOUNTER — Ambulatory Visit: Payer: Self-pay | Admitting: *Deleted

## 2022-01-26 NOTE — Patient Outreach (Signed)
  Care Coordination   Collaboration/referral  Visit Note   01/26/2022 Name: TOPHER BUENAVENTURA MRN: [id number] DOB: 15-Jan-1951  Tedd Cottrill Littlejohn is a 71 y.o. year old male who sees Cletis Athens, MD for primary care. I  spoke with Joe from meals on wheels today  What matters to the patients health and wellness today?  Meals on Wheels    Goals Addressed             This Visit's Progress    I would like meals on wheels       Care Coordination Interventions: Referral for meals on wheels completed, spoke with Joe who will forward patient's information to the social worker Patient on wait list now, however when a spot opens the social worker will schedule an appointment to complete a home visit to further determine eligibility          SDOH assessments and interventions completed:  No     Care Coordination Interventions:  Yes, provided   Follow up plan: Follow up call scheduled for 02/06/22    Encounter Outcome:  Pt. Visit Completed

## 2022-02-06 ENCOUNTER — Encounter: Payer: No Typology Code available for payment source | Admitting: *Deleted

## 2022-02-06 ENCOUNTER — Ambulatory Visit: Payer: No Typology Code available for payment source

## 2022-02-07 DIAGNOSIS — I509 Heart failure, unspecified: Secondary | ICD-10-CM | POA: Diagnosis not present

## 2022-02-07 DIAGNOSIS — K219 Gastro-esophageal reflux disease without esophagitis: Secondary | ICD-10-CM | POA: Diagnosis not present

## 2022-02-07 DIAGNOSIS — I340 Nonrheumatic mitral (valve) insufficiency: Secondary | ICD-10-CM | POA: Diagnosis not present

## 2022-02-07 DIAGNOSIS — I6389 Other cerebral infarction: Secondary | ICD-10-CM | POA: Diagnosis not present

## 2022-02-07 DIAGNOSIS — F17200 Nicotine dependence, unspecified, uncomplicated: Secondary | ICD-10-CM | POA: Diagnosis not present

## 2022-02-07 DIAGNOSIS — I739 Peripheral vascular disease, unspecified: Secondary | ICD-10-CM | POA: Diagnosis not present

## 2022-02-07 DIAGNOSIS — I4892 Unspecified atrial flutter: Secondary | ICD-10-CM | POA: Diagnosis not present

## 2022-02-07 DIAGNOSIS — E669 Obesity, unspecified: Secondary | ICD-10-CM | POA: Diagnosis not present

## 2022-02-07 DIAGNOSIS — J449 Chronic obstructive pulmonary disease, unspecified: Secondary | ICD-10-CM | POA: Diagnosis not present

## 2022-02-07 NOTE — Progress Notes (Unsigned)
MRN : [id number]  Gregory Lane is a 72 y.o. ([birth year]) male who presents with chief complaint of check circulation.  History of Present Illness:  The patient returns to the office for followup and review status post angiogram with intervention on 09/21/2021.   Procedure: Mechanical thrombectomy left SFA, profunda and SFA with the Penumbra catheter  The patient notes improvement in the lower extremity symptoms. No interval shortening of the patient's claudication distance or rest pain symptoms. No new ulcers or wounds have occurred since the last visit.  There have been no significant changes to the patient's overall health care.  No documented history of amaurosis fugax or recent TIA symptoms. There are no recent neurological changes noted. No documented history of DVT, PE or superficial thrombophlebitis. The patient denies recent episodes of angina or shortness of breath.   ABI's Rt=1.12 and Lt=1.15   Duplex US of the left lower extremity arterial system shows patent SFA with uniform flow   No outpatient medications have been marked as taking for the 02/09/22 encounter (Appointment) with Delana Meyer, Dolores Lory, MD.    Past Medical History:  Diagnosis Date   Aortic atherosclerosis ([place]) 02/16/2017   Arthritis    Blind left eye    Hypertension    Low TSH level 02/17/2017   Myocardial infarction [facility])    Perforated duodenal ulcer ([place])    Stroke [facility])     Past Surgical History:  Procedure Laterality Date   EXPLORATORY LAPAROTOMY  05/12/2015   EYE SURGERY     FRACTURE SURGERY     LAPAROTOMY N/A 05/10/2015   Procedure: EXPLORATORY LAPAROTOMY;  Surgeon: Ralene Ok, MD;  Location: [place];  Service: General;  Laterality: N/A;   LEFT HEART CATH AND CORONARY ANGIOGRAPHY N/A 05/02/2016   Procedure: Left Heart Cath and Coronary Angiography;  Surgeon: Adrian Prows, MD;  Location: [place] CV LAB;  Service: Cardiovascular;  Laterality: N/A;   LOWER EXTREMITY  ANGIOGRAPHY Left 09/21/2021   Procedure: Lower Extremity Angiography;  Surgeon: Katha Cabal, MD;  Location: [place] CV LAB;  Service: Cardiovascular;  Laterality: Left;    Social History Social History   Tobacco Use   Smoking status: Every Day    Packs/day: 1.00    Years: 60.00    Total pack years: 60.00    Types: Cigarettes   Smokeless tobacco: Never  Substance Use Topics   Alcohol use: No    Alcohol/week: 60.0 standard drinks of alcohol    Types: 60 Standard drinks or equivalent per week    Comment: daily 12 pk of beer   Drug use: No    Family History Family History  Problem Relation Age of Onset   Cirrhosis Father    Hypertension Mother    Lung cancer Sister    Hypertension Sister    Arthritis Sister    Heart murmur Sister     No Known Allergies   REVIEW OF SYSTEMS (Negative unless checked)  Constitutional: [] Weight loss  [] Fever  [] Chills Cardiac: [] Chest pain   [] Chest pressure   [] Palpitations   [] Shortness of breath when laying flat   [x] Shortness of breath with exertion. Vascular:  [x] Pain in legs with walking   [] Pain in legs at rest  [] History of DVT   [] Phlebitis   [] Swelling in legs   [] Varicose veins   [] Non-healing ulcers Pulmonary:   [] Uses home oxygen   [] Productive cough   [] Hemoptysis   []   Wheeze  [x] COPD   [] Asthma Neurologic:  [] Dizziness   [] Seizures   [] History of stroke   [] History of TIA  [] Aphasia   [] Vissual changes   [] Weakness or numbness in arm   [] Weakness or numbness in leg Musculoskeletal:   [] Joint swelling   [] Joint pain   [] Low back pain Hematologic:  [] Easy bruising  [] Easy bleeding   [] Hypercoagulable state   [] Anemic Gastrointestinal:  [] Diarrhea   [] Vomiting  [x] Gastroesophageal reflux/heartburn   [] Difficulty swallowing. Genitourinary:  [] Chronic kidney disease   [] Difficult urination  [] Frequent urination   [] Blood in urine Skin:  [] Rashes   [] Ulcers  Psychological:  [] History of anxiety   []  History of major  depression.  Physical Examination  There were no vitals filed for this visit. There is no height or weight on file to calculate BMI. Gen: WD/WN, NAD Head: [place]/AT, No temporalis wasting.  Ear/Nose/Throat: Hearing grossly intact, nares w/o erythema or drainage Eyes: PER, EOMI, sclera nonicteric.  Neck: Supple, no masses.  No bruit or JVD.  Pulmonary:  Good air movement, no audible wheezing, no use of accessory muscles.  Cardiac: RRR, normal S1, S2, no Murmurs. Vascular:  mild trophic changes, no open wounds Vessel Right Left  Radial Palpable Palpable  PT  Palpable Palpable  DP Not Palpable Not Palpable  Gastrointestinal: soft, non-distended. No guarding/no peritoneal signs.  Musculoskeletal: M/S 5/5 throughout.  No visible deformity.  Neurologic: CN 2-12 intact. Pain and light touch intact in extremities.  Symmetrical.  Speech is fluent. Motor exam as listed above. Psychiatric: Judgment intact, Mood & affect appropriate for pt's clinical situation. Dermatologic: No rashes or ulcers noted.  No changes consistent with cellulitis.   CBC Lab Results  Component Value Date   WBC 5.7 10/31/2021   HGB 13.8 10/31/2021   HCT 40.7 10/31/2021   MCV 101.2 (H) 10/31/2021   PLT 195 10/31/2021    BMET    Component Value Date/Time   NA 141 10/31/2021 1215   K 4.1 10/31/2021 1215   CL 110 10/31/2021 1215   CO2 23 10/31/2021 1215   GLUCOSE 115 (H) 10/31/2021 1215   BUN 14 10/31/2021 1215   CREATININE 0.83 10/31/2021 1215   CREATININE 0.91 12/19/2019 1114   CALCIUM 9.0 10/31/2021 1215   GFRNONAA >60 10/31/2021 1215   GFRNONAA 84 12/19/2019 1114   GFRAA 98 12/19/2019 1114   CrCl cannot be calculated (Patient's most recent lab result is older than the maximum 21 days allowed.).  COAG Lab Results  Component Value Date   INR 2.7 12/12/2021   INR 2.3 11/28/2021   INR 3.2 (A) 11/14/2021    Radiology No results found.   Assessment/Plan 1. PAD (peripheral artery disease)  (HCC) Recommend:  The patient is status post successful angiogram with intervention.  The patient reports that the claudication symptoms and leg pain has improved.   The patient denies lifestyle limiting changes at this point in time.  No further invasive studies, angiography or surgery at this time The patient should continue walking and begin a more formal exercise program.  The patient should continue antiplatelet therapy and aggressive treatment of the lipid abnormalities  Continued surveillance is indicated as atherosclerosis is likely to progress with time.    Patient should undergo noninvasive studies as ordered. The patient will follow up with me to review the studies.  - VAS [place] ABI WITH/WO TBI; Future  2. Leg weakness, bilateral Recommend:  The patient has atypical pain symptoms for vascular disease and on exam  I do not find evidence of vascular pathology that would explain the patient's symptoms.  Noninvasive studies do not identify significant vascular problems in fact his left leg remains well perfused and his right leg is near normal so this would not explain his bilateral symptoms  I suspect the patient is c/o pseudoclaudication.  Patient should have an evaluation of the LS spine which I defer to the primary service or the Spine service.  The patient should continue walking and begin a more formal exercise program. The patient should continue his antiplatelet therapy and aggressive treatment of the lipid abnormalities.  - Ambulatory referral to Neurosurgery  3. Atrial flutter with rapid ventricular response (HCC) Continue antiarrhythmia medications as already ordered, these medications have been reviewed and there are no changes at this time.  Continue anticoagulation as ordered by Cardiology Service  4. Essential hypertension Continue antihypertensive medications as already ordered, these medications have been reviewed and there are no changes at this time.  5.  Coronary artery disease involving native coronary artery of native heart without angina pectoris Continue cardiac and antihypertensive medications as already ordered and reviewed, no changes at this time.  Continue statin as ordered and reviewed, no changes at this time  Nitrates PRN for chest pain  6. Chronic obstructive pulmonary disease, unspecified COPD type ([place]) Continue pulmonary medications and aerosols as already ordered, these medications have been reviewed and there are no changes at this time.     [name], MD  02/07/2022 3:16 PM

## 2022-02-08 ENCOUNTER — Other Ambulatory Visit (INDEPENDENT_AMBULATORY_CARE_PROVIDER_SITE_OTHER): Payer: Self-pay | Admitting: Nurse Practitioner

## 2022-02-08 DIAGNOSIS — I739 Peripheral vascular disease, unspecified: Secondary | ICD-10-CM

## 2022-02-09 ENCOUNTER — Ambulatory Visit (INDEPENDENT_AMBULATORY_CARE_PROVIDER_SITE_OTHER): Payer: No Typology Code available for payment source

## 2022-02-09 ENCOUNTER — Ambulatory Visit (INDEPENDENT_AMBULATORY_CARE_PROVIDER_SITE_OTHER): Payer: No Typology Code available for payment source | Admitting: Vascular Surgery

## 2022-02-09 ENCOUNTER — Encounter (INDEPENDENT_AMBULATORY_CARE_PROVIDER_SITE_OTHER): Payer: Self-pay | Admitting: Vascular Surgery

## 2022-02-09 VITALS — BP 137/77 | HR 64 | Resp 16 | Ht 73.0 in | Wt 225.0 lb

## 2022-02-09 DIAGNOSIS — J449 Chronic obstructive pulmonary disease, unspecified: Secondary | ICD-10-CM

## 2022-02-09 DIAGNOSIS — R29898 Other symptoms and signs involving the musculoskeletal system: Secondary | ICD-10-CM | POA: Diagnosis not present

## 2022-02-09 DIAGNOSIS — I4892 Unspecified atrial flutter: Secondary | ICD-10-CM | POA: Diagnosis not present

## 2022-02-09 DIAGNOSIS — Z9889 Other specified postprocedural states: Secondary | ICD-10-CM | POA: Diagnosis not present

## 2022-02-09 DIAGNOSIS — I739 Peripheral vascular disease, unspecified: Secondary | ICD-10-CM

## 2022-02-09 DIAGNOSIS — I10 Essential (primary) hypertension: Secondary | ICD-10-CM

## 2022-02-09 DIAGNOSIS — I2510 Atherosclerotic heart disease of native coronary artery without angina pectoris: Secondary | ICD-10-CM

## 2022-02-16 ENCOUNTER — Ambulatory Visit
Admission: RE | Admit: 2022-02-16 | Discharge: 2022-02-16 | Disposition: A | Discharge status: Home / Self Care | Payer: No Typology Code available for payment source | Source: Ambulatory Visit | Attending: Cardiology | Admitting: Cardiology

## 2022-02-16 ENCOUNTER — Ambulatory Visit: Payer: Self-pay | Admitting: *Deleted

## 2022-02-16 DIAGNOSIS — R911 Solitary pulmonary nodule: Secondary | ICD-10-CM | POA: Diagnosis not present

## 2022-02-16 DIAGNOSIS — I714 Abdominal aortic aneurysm, without rupture, unspecified: Secondary | ICD-10-CM | POA: Diagnosis not present

## 2022-02-16 DIAGNOSIS — I700 Atherosclerosis of aorta: Secondary | ICD-10-CM | POA: Insufficient documentation

## 2022-02-16 DIAGNOSIS — R931 Abnormal findings on diagnostic imaging of heart and coronary circulation: Secondary | ICD-10-CM | POA: Diagnosis not present

## 2022-02-16 LAB — GLUCOSE, CAPILLARY: Glucose-Capillary: 107 mg/dL — ABNORMAL HIGH (ref 70–99)

## 2022-02-16 MED ORDER — FLUDEOXYGLUCOSE F - 18 (FDG) INJECTION
12.4200 | Freq: Once | INTRAVENOUS | Status: AC
  Administered 2022-02-16: 12.42 via INTRAVENOUS

## 2022-02-16 NOTE — Patient Instructions (Signed)
Visit Information  Thank you for taking time to visit with me today. Please don't hesitate to contact me if I can be of assistance to you.   Following are the goals we discussed today:   Goals Addressed             This Visit's Progress    I would like meals on wheels       Care Coordination Interventions: Phone call to patient to discuss Meals on Wheels referral and to assess for additional community resource needs Confirmed that the referral for meals on wheels has been completed Patient on wait list now, however when a spot opens the social worker will schedule an appointment to complete a home visit to further determine eligibility Patient reminded to answer the phone as call may not be a recognized number Patient confirmed having no additional community resource needs at this time           Please call the care guide team at [phone] if you need to cancel or reschedule your appointment.   If you are experiencing a Mental Health or [place] or need someone to talk to, please call [phone]   Patient verbalizes understanding of instructions and care plan provided today and agrees to view in [place]. Active MyChart status and patient understanding of how to access instructions and care plan via MyChart confirmed with patient.     No further follow up required: patient to call this Education officer, museum with any additional community resource needs   [employer], [place] Worker  [facility] Care Management [phone]

## 2022-02-16 NOTE — Patient Outreach (Signed)
  Care Coordination   Follow Up Visit Note   02/16/2022 Name: Gregory Lane MRN: [id number] DOB: 1950/08/28  Gregory Lane is a 71 y.o. year old male who sees Cletis Athens, MD for primary care. I spoke with  Gregory Lane by phone today.  What matters to the patients health and wellness today?  Meals on Wheels    Goals Addressed             This Visit's Progress    I would like meals on wheels       Care Coordination Interventions: Phone call to patient to discuss Meals on Wheels referral and to assess for additional community resource needs Confirmed that the referral for meals on wheels has been completed Patient on wait list now, however when a spot opens the social worker will schedule an appointment to complete a home visit to further determine eligibility Patient reminded to answer the phone as call may not be a recognized number Patient confirmed having no additional community resource needs at this time          SDOH assessments and interventions completed:  No     Care Coordination Interventions:  Yes, provided   Follow up plan: No further intervention required.   Encounter Outcome:  Pt. Visit Completed

## 2022-02-21 ENCOUNTER — Ambulatory Visit (INDEPENDENT_AMBULATORY_CARE_PROVIDER_SITE_OTHER): Payer: No Typology Code available for payment source | Admitting: Internal Medicine

## 2022-02-21 ENCOUNTER — Encounter: Payer: Self-pay | Admitting: Internal Medicine

## 2022-02-21 ENCOUNTER — Telehealth: Payer: Self-pay

## 2022-02-21 VITALS — BP 148/82 | HR 86 | Ht 73.0 in | Wt 229.4 lb

## 2022-02-21 DIAGNOSIS — F17210 Nicotine dependence, cigarettes, uncomplicated: Secondary | ICD-10-CM | POA: Diagnosis not present

## 2022-02-21 DIAGNOSIS — J441 Chronic obstructive pulmonary disease with (acute) exacerbation: Secondary | ICD-10-CM

## 2022-02-21 DIAGNOSIS — R942 Abnormal results of pulmonary function studies: Secondary | ICD-10-CM

## 2022-02-21 DIAGNOSIS — I2510 Atherosclerotic heart disease of native coronary artery without angina pectoris: Secondary | ICD-10-CM

## 2022-02-21 DIAGNOSIS — F17200 Nicotine dependence, unspecified, uncomplicated: Secondary | ICD-10-CM

## 2022-02-21 DIAGNOSIS — I6340 Cerebral infarction due to embolism of unspecified cerebral artery: Secondary | ICD-10-CM

## 2022-02-21 DIAGNOSIS — I10 Essential (primary) hypertension: Secondary | ICD-10-CM | POA: Diagnosis not present

## 2022-02-21 DIAGNOSIS — R911 Solitary pulmonary nodule: Secondary | ICD-10-CM

## 2022-02-21 DIAGNOSIS — R0602 Shortness of breath: Secondary | ICD-10-CM

## 2022-02-21 LAB — POCT INR: POC INR: 2.2

## 2022-02-21 NOTE — Assessment & Plan Note (Signed)
-   I instructed the patient to stop smoking and provided them with smoking cessation materials.  - I informed the patient that smoking puts them at increased risk for cancer, COPD, hypertension, and more.  - Informed the patient to seek help if they begin to have trouble breathing, develop chest pain, start to cough up blood, feel faint, or pass out.  

## 2022-02-21 NOTE — Telephone Encounter (Signed)
Error

## 2022-02-21 NOTE — Assessment & Plan Note (Signed)

## 2022-02-21 NOTE — Progress Notes (Signed)
Established Patient Office Visit  Subjective:  Patient ID: [name], male    DOB: [birth year]  Age: 72 y.o. MRN: [id number]  CC:  Chief Complaint  Patient presents with   Results    PET scan results    HPI  Gregory Lane presents for check up  Past Medical History:  Diagnosis Date   Aortic atherosclerosis ([place]) 02/16/2017   Arthritis    Blind left eye    Hypertension    Low TSH level 02/17/2017   Myocardial infarction [facility])    Perforated duodenal ulcer ([place])    Stroke [facility])     Past Surgical History:  Procedure Laterality Date   EXPLORATORY LAPAROTOMY  05/12/2015   EYE SURGERY     FRACTURE SURGERY     LAPAROTOMY N/A 05/10/2015   Procedure: EXPLORATORY LAPAROTOMY;  Surgeon: Ralene Ok, MD;  Location: [place];  Service: General;  Laterality: N/A;   LEFT HEART CATH AND CORONARY ANGIOGRAPHY N/A 05/02/2016   Procedure: Left Heart Cath and Coronary Angiography;  Surgeon: Adrian Prows, MD;  Location: [place] CV LAB;  Service: Cardiovascular;  Laterality: N/A;   LOWER EXTREMITY ANGIOGRAPHY Left 09/21/2021   Procedure: Lower Extremity Angiography;  Surgeon: Katha Cabal, MD;  Location: [place] CV LAB;  Service: Cardiovascular;  Laterality: Left;    Family History  Problem Relation Age of Onset   Cirrhosis Father    Hypertension Mother    Lung cancer Sister    Hypertension Sister    Arthritis Sister    Heart murmur Sister     Social History   Socioeconomic History   Marital status: Single    Spouse name: Not on file   Number of children: Not on file   Years of education: Not on file   Highest education level: Not on file  Occupational History   Not on file  Tobacco Use   Smoking status: Every Day    Packs/day: 1.00    Years: 60.00    Total pack years: 60.00    Types: Cigarettes   Smokeless tobacco: Never  Substance and Sexual Activity   Alcohol use: No    Alcohol/week: 60.0 standard drinks of alcohol    Types: 60 Standard drinks or  equivalent per week    Comment: daily 12 pk of beer   Drug use: No   Sexual activity: Not Currently  Other Topics Concern   Not on file  Social History Narrative   [place] Veteran. Lives alone   Social Determinants of Health   Financial Resource Strain: Low Risk  (01/23/2022)   Overall Financial Resource Strain (CARDIA)    Difficulty of Paying Living Expenses: Not very hard  Food Insecurity: No Food Insecurity (01/23/2022)   Hunger Vital Sign    Worried About Running Out of Food in the Last Year: Never true    Ran Out of Food in the Last Year: Never true  Transportation Needs: No Transportation Needs (01/23/2022)   PRAPARE - Hydrologist (Medical): No    Lack of Transportation (Non-Medical): No  Physical Activity: Inactive (01/23/2022)   Exercise Vital Sign    Days of Exercise per Week: 0 days    Minutes of Exercise per Session: 0 min  Stress: No Stress Concern Present (09/09/2021)   [place]    Feeling of Stress : Only a little  Social Connections: Socially Isolated (09/09/2021)   Social Connection and Isolation  Panel [NHANES]    Frequency of Communication with Friends and Family: More than three times a week    Frequency of Social Gatherings with Friends and Family: More than three times a week    Attends Religious Services: Never    Marine scientist or Organizations: No    Attends Archivist Meetings: Never    Marital Status: Never married  Intimate Partner Violence: Not At Risk (09/09/2021)   Humiliation, Afraid, Rape, and Kick questionnaire    Fear of Current or Ex-Partner: No    Emotionally Abused: No    Physically Abused: No    Sexually Abused: No     Current Outpatient Medications:    atorvastatin (LIPITOR) 40 MG tablet, Take 0.5 tablets (20 mg total) by mouth daily. (Patient taking differently: Take 40 mg by mouth daily.), Disp: 90 tablet, Rfl: 3    FLUoxetine (PROZAC) 20 MG capsule, Take 2 capsules (40 mg total) by mouth daily., Disp: 60 capsule, Rfl: 1   pantoprazole (PROTONIX) 40 MG tablet, Take 1 tablet (40 mg total) by mouth daily., Disp: 90 tablet, Rfl: 3   warfarin (COUMADIN) 4 MG tablet, Take 1 tablet (4 mg total) by mouth daily. Take 1 daily Monday  through Friday. Do not take Saturday and Sunday.Take 4 mg by mouth daily., Disp: 30 tablet, Rfl: 6   No Known Allergies  ROS Review of Systems  Constitutional:  Positive for fatigue.  Respiratory:  Positive for cough and shortness of breath. Negative for chest tightness.   Cardiovascular:  Negative for chest pain.  Gastrointestinal:  Negative for abdominal distention.  Genitourinary:  Negative for difficulty urinating.  Neurological:  Negative for seizures.  Psychiatric/Behavioral:  Negative for behavioral problems.       Objective:    Physical Exam Vitals reviewed.  Constitutional:      Appearance: Normal appearance.  HENT:     Mouth/Throat:     Mouth: Mucous membranes are moist.  Eyes:     Pupils: Pupils are equal, round, and reactive to light.  Neck:     Vascular: No carotid bruit.  Cardiovascular:     Rate and Rhythm: Normal rate. Rhythm irregular.     Pulses: Normal pulses.     Heart sounds: Normal heart sounds.  Pulmonary:     Effort: Pulmonary effort is normal.     Breath sounds: Normal breath sounds.  Abdominal:     General: Bowel sounds are normal.     Palpations: Abdomen is soft. There is no hepatomegaly, splenomegaly or mass.     Tenderness: There is no abdominal tenderness.     Hernia: No hernia is present.  Musculoskeletal:     Cervical back: Neck supple.     Right lower leg: No edema.     Left lower leg: No edema.  Skin:    Findings: No rash.  Neurological:     Mental Status: Gregory Lane is alert and oriented to person, place, and time.     Motor: No weakness.  Psychiatric:        Mood and Affect: Mood normal.        Behavior: Behavior normal.      BP (!) 148/82   Pulse 86   Ht 6\' 1"  (1.854 m)   Wt 229 lb 6.4 oz (104.1 kg)   SpO2 98%   BMI 30.27 kg/m  Wt Readings from Last 3 Encounters:  02/21/22 229 lb 6.4 oz (104.1 kg)  02/09/22 225 lb (102.1 kg)  01/23/22  224 lb 9.6 oz (101.9 kg)     Health Maintenance Due  Topic Date Due   Lung Cancer Screening  02/16/2018   COVID-19 Vaccine (2 - 2023-24 season) 10/21/2021    There are no preventive care reminders to display for this patient.  Lab Results  Component Value Date   TSH 1.78 12/19/2019   Lab Results  Component Value Date   WBC 5.7 10/31/2021   HGB 13.8 10/31/2021   HCT 40.7 10/31/2021   MCV 101.2 (H) 10/31/2021   PLT 195 10/31/2021   Lab Results  Component Value Date   NA 141 10/31/2021   K 4.1 10/31/2021   CO2 23 10/31/2021   GLUCOSE 115 (H) 10/31/2021   BUN 14 10/31/2021   CREATININE 0.83 10/31/2021   BILITOT 0.5 12/19/2019   ALKPHOS 71 02/16/2017   AST 13 12/19/2019   ALT 20 12/19/2019   PROT 7.4 12/19/2019   ALBUMIN 3.7 02/16/2017   CALCIUM 9.0 10/31/2021   ANIONGAP 8 10/31/2021   Lab Results  Component Value Date   CHOL 235 (H) 12/19/2019   Lab Results  Component Value Date   HDL 46 12/19/2019   Lab Results  Component Value Date   LDLCALC 160 (H) 12/19/2019   Lab Results  Component Value Date   TRIG 155 (H) 12/19/2019   Lab Results  Component Value Date   CHOLHDL 5.1 (H) 12/19/2019   Lab Results  Component Value Date   HGBA1C 5.5 05/24/2015      Assessment & Plan:   Problem List Items Addressed This Visit       Cardiovascular and Mediastinum   Essential hypertension - Primary     Patient denies any chest pain or shortness of breath there is no history of palpitation or paroxysmal nocturnal dyspnea   patient was advised to follow low-salt low-cholesterol diet    ideally I want to keep systolic blood pressure below 130 mmHg, patient was asked to check blood pressure one times a week and give me a report on that.   Patient will be follow-up in 3 months  or earlier as needed, patient will call me back for any change in the cardiovascular symptoms Patient was advised to buy a book from local bookstore concerning blood pressure and read several chapters  every day.  This will be supplemented by some of the material we will give him from the office.  Patient should also utilize other resources like YouTube and Internet to learn more about the blood pressure and the diet.      Cerebrovascular accident (CVA) due to embolism of cerebral artery ([place])    Patient is taking Coumadin5 mg /atrial fibrillation pro time is therapeutic      Relevant Orders   POCT INR (Completed)   Coronary artery disease     Respiratory   COPD with acute exacerbation ([place])    Refer to pulmonary patient was advised to quit smoking      Relevant Orders   Ambulatory referral to Pulmonology     Other   Smoking    - I instructed the patient to stop smoking and provided them with smoking cessation materials.  - I informed the patient that smoking puts them at increased risk for cancer, COPD, hypertension, and more.  - Informed the patient to seek help if they begin to have trouble breathing, develop chest pain, start to cough up blood, feel faint, or pass out.      SOB (shortness of breath)  PET scan is abnormal refer to pulmonology patient has a history of pulmonary nodule      Relevant Orders   Ambulatory referral to Pulmonology   Other Visit Diagnoses     Abnormal PET scan of lung       Relevant Orders   Ambulatory referral to Pulmonology   Pulmonary nodule       Relevant Orders   Ambulatory referral to Pulmonology       No orders of the defined types were placed in this encounter.   Follow-up: No follow-ups on file.    Cletis Athens, MD

## 2022-02-21 NOTE — Assessment & Plan Note (Signed)
Refer to pulmonary patient was advised to quit smoking

## 2022-02-21 NOTE — Assessment & Plan Note (Signed)
Patient is taking Coumadin5 mg /atrial fibrillation pro time is therapeutic

## 2022-02-21 NOTE — Assessment & Plan Note (Signed)
PET scan is abnormal refer to pulmonology patient has a history of pulmonary nodule

## 2022-02-23 ENCOUNTER — Ambulatory Visit (INDEPENDENT_AMBULATORY_CARE_PROVIDER_SITE_OTHER)
Payer: No Typology Code available for payment source | Admitting: Student in an Organized Health Care Education/Training Program

## 2022-02-23 ENCOUNTER — Encounter: Payer: Self-pay | Admitting: Student in an Organized Health Care Education/Training Program

## 2022-02-23 VITALS — BP 138/80 | HR 80 | Temp 98.0°F | Ht 73.0 in | Wt 224.8 lb

## 2022-02-23 DIAGNOSIS — I4811 Longstanding persistent atrial fibrillation: Secondary | ICD-10-CM | POA: Diagnosis not present

## 2022-02-23 DIAGNOSIS — R0602 Shortness of breath: Secondary | ICD-10-CM | POA: Diagnosis not present

## 2022-02-23 DIAGNOSIS — I739 Peripheral vascular disease, unspecified: Secondary | ICD-10-CM | POA: Diagnosis not present

## 2022-02-23 DIAGNOSIS — R911 Solitary pulmonary nodule: Secondary | ICD-10-CM

## 2022-02-23 NOTE — Progress Notes (Signed)
Synopsis: Referred in for pulmonary nodule by Cletis Athens, MD  Assessment & Plan:   #Pulmonary Nodule #Shortness of breath #Longstanding persistent atrial fibrillation  #PAD (peripheral artery disease) ([place])  Nodule Location:RLL Nodule Size: 11 mm Nodule Spiculation: No Associated Lymphadenopathy: No Smoking Status (current) and 60 pack years Extrathoracic cancer > 5 years prior (no) SPN malignancy risk score [facility]): 60 % risk of malignancy ECOG: 1   The patient is here to discuss their imaging abnormalities which include an FDG avid RLL nodule measuring up to 1.1 cm with associated non-palpable cervical lymph nodes that were small and mildly avid on PET/CT. Given the patient's history of smoking, age, size of nodule, and PET avidity, his [facility] SPN risk score is 60%. He would benefit from a biopsy for a definitive diagnosis. Given he is on warfarin, he would require it to be held with a Lovenox bridge. The patient is unable to navigate the complexity of holding and restarting the warfarin in addition to starting and holding the Lovenox. I spoke to his sister over the phone who is also uncomfortable managing the lovenox bridge. He also has a history of cardiovascular disease and is at high risk for CAD. He would benefit from an evaluation by cardiology for risk stratification prior to undergoing general anesthesia for the robotic assisted navigational bronchoscopy. Given all this, I have placed a referral to our cardiology colleagues for risk stratification and visit to the coumadin clinic.  I discussed the importance of diagnosis and staging in lung malignancies with the patient, and the approach to obtaining a tissue diagnosis which would include robotic assisted navigational bronchoscopy with endobronchial ultrasound guided sampling.  We also discussed the risks associated with the procedure which include a 2% risk of pneumothorax, infection, bleeding, and  nondiagnostic procedure in detail.  I explained that patients typically are able to return home the same day of the procedure, but in rare cases admission to the hospital for observation and treatment is required.  After our discussion, the patient elected to proceed with the procedure  Recommendations: - Robotic Assisted navigational bronchoscopy - Ambulatory referral to Cardiology - ECHOCARDIOGRAM COMPLETE; Future   No follow-ups on file.  I spent 70 minutes caring for this patient today, including preparing to see the patient, obtaining a medical history , reviewing a separately obtained history, performing a medically appropriate examination and/or evaluation, counseling and educating the patient/family/caregiver, ordering medications, tests, or procedures, referring and communicating with other health care professionals (not separately reported), documenting clinical information in the electronic health record, independently interpreting results (not separately reported/billed) and communicating results to the patient/family/caregiver, and care coordination (not separately reported/billed)  Armando Reichert, MD [facility] 02/23/2022 1:41 PM    End of visit medications:  No orders of the defined types were placed in this encounter.    Current Outpatient Medications:    atorvastatin (LIPITOR) 40 MG tablet, Take 0.5 tablets (20 mg total) by mouth daily. (Patient taking differently: Take 40 mg by mouth daily.), Disp: 90 tablet, Rfl: 3   FLUoxetine (PROZAC) 20 MG capsule, Take 2 capsules (40 mg total) by mouth daily., Disp: 60 capsule, Rfl: 1   pantoprazole (PROTONIX) 40 MG tablet, Take 1 tablet (40 mg total) by mouth daily., Disp: 90 tablet, Rfl: 3   warfarin (COUMADIN) 4 MG tablet, Take 1 tablet (4 mg total) by mouth daily. Take 1 daily Monday  through Friday. Do not take Saturday and Sunday.Take 4 mg by mouth daily.,  Disp: 30 tablet, Rfl: 6   Subjective:   PATIENT  ID: Lyna Poser GENDER: male DOB: 26-Dec-1950, MRN: [id number]  Chief Complaint  Patient presents with   pulmonary consult    PET 02/21/22- hx of COPD. C/o SOB with exertion, wheezing and prod with yellow sputum.     HPI  Mr. Burkey is a pleasant 72 year old male presenting to clinic for the evaluation of a pulmonary nodule.  The patient has a history of multiple medical problems that include afib/flutter for which he is on warfarin. He also has a history of PAD and was admitted with critical limb ischemia to the hospital in August of 2023. He underwent abdominal angiography with mechanical thrombectomy to the left SFA, left profunda femoris, and distal left common femoral artery. He was switched from Eliquis to Warfarin during said admission. He also reports chronic shortness of breath for which he is followed by his primary care physician.  He does not have any symptoms of chest tightness, chest pain, or hemoptysis. He does report exertional shortness of breath and a chronic cough that is non-productive. He tells me that his knees are weak and that prevents him from any strenuous exertion. He is not on any inhalers and does not report a history of any recurrent bronchitis, URTI's, or exacerbations.  He is followed closely by his PCP Dr. Lavera Guise who had ordered a PET/CT that was performed on 02/21/2022. The CT was notable for a RLL 1.1 cm nodule that was hypermetabolic. The PET was also notable for three scattered non-enlarged mildly hypermetabolic bilateral upper cervical lymph nodes. No other imaging is available for comparison, with the last chest CT being from 2018.  The patient has a significant history of smoking. He tells me he started smoking at the age of 33, and continues to smoke to this day. He has at least a 60 pack year smoking history. He currently smokes 0.5 packs a day. 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 He currently  lives alone. His sister helps him with his appointments and with some of his medications.  Ancillary information including prior medications, full medical/surgical/family/social histories, and PFTs (when available) are listed below and have been reviewed.    Review of Systems  Constitutional:  Negative for chills, fever, malaise/fatigue and weight loss.  HENT:  Positive for hearing loss.   Respiratory:  Positive for cough and shortness of breath. Negative for hemoptysis, sputum production and wheezing.   Cardiovascular:  Negative for chest pain and palpitations.  Skin:  Negative for rash.     Objective:   Vitals:   02/23/22 1112  BP: 138/80  Pulse: 80  Temp: 98 F (36.7 C)  TempSrc: Temporal  SpO2: 97%  Weight: 224 lb 12.8 oz (102 kg)  Height: 6\' 1"  (1.854 m)   97% on RA  BMI Readings from Last 3 Encounters:  02/23/22 29.66 kg/m  02/21/22 30.27 kg/m  02/09/22 29.69 kg/m   Wt Readings from Last 3 Encounters:  02/23/22 224 lb 12.8 oz (102 kg)  02/21/22 229 lb 6.4 oz (104.1 kg)  02/09/22 225 lb (102.1 kg)    Physical Exam Constitutional:      General: He is not in acute distress.    Appearance: He is obese. He is not ill-appearing.  HENT:     Head: Normocephalic.     Mouth/Throat:     Mouth: Mucous membranes are moist.  Eyes:  Extraocular Movements: Extraocular movements intact.  Cardiovascular:     Rate and Rhythm: Normal rate. Rhythm irregular.     Heart sounds: Normal heart sounds.  Pulmonary:     Effort: Pulmonary effort is normal.     Breath sounds: Normal breath sounds. No wheezing, rhonchi or rales.  Abdominal:     General: There is distension.     Palpations: Abdomen is soft.  Musculoskeletal:        General: Normal range of motion.  Neurological:     General: No focal deficit present.     Mental Status: He is alert. Mental status is at baseline.       Ancillary Information    Past Medical History:  Diagnosis Date   Aortic  atherosclerosis ([place]) 02/16/2017   Arthritis    Blind left eye    Hypertension    Low TSH level 02/17/2017   Myocardial infarction ([place])    Perforated duodenal ulcer ([place])    Stroke ([place])      Family History  Problem Relation Age of Onset   Cirrhosis Father    Hypertension Mother    Lung cancer Sister    Hypertension Sister    Arthritis Sister    Heart murmur Sister      Past Surgical History:  Procedure Laterality Date   EXPLORATORY LAPAROTOMY  05/12/2015   EYE SURGERY     FRACTURE SURGERY     LAPAROTOMY N/A 05/10/2015   Procedure: EXPLORATORY LAPAROTOMY;  Surgeon: Ralene Ok, MD;  Location: [facility];  Service: General;  Laterality: N/A;   LEFT HEART CATH AND CORONARY ANGIOGRAPHY N/A 05/02/2016   Procedure: Left Heart Cath and Coronary Angiography;  Surgeon: Adrian Prows, MD;  Location: [facility];  Service: Cardiovascular;  Laterality: N/A;   LOWER EXTREMITY ANGIOGRAPHY Left 09/21/2021   Procedure: Lower Extremity Angiography;  Surgeon: Katha Cabal, MD;  Location: [facility];  Service: Cardiovascular;  Laterality: Left;    Social History   Socioeconomic History   Marital status: Single    Spouse name: Not on file   Number of children: Not on file   Years of education: Not on file   Highest education level: Not on file  Occupational History   Not on file  Tobacco Use   Smoking status: Every Day    Packs/day: 0.50    Years: 60.00    Total pack years: 30.00    Types: Cigarettes   Smokeless tobacco: Never   Tobacco comments:    0.5 PPD  Substance and Sexual Activity   Alcohol use: No    Alcohol/week: 60.0 standard drinks of alcohol    Types: 60 Standard drinks or equivalent per week    Comment: daily 12 pk of beer   Drug use: No   Sexual activity: Not Currently  Other Topics Concern   Not on file  Social History Narrative   [place] Veteran. Lives alone   Social Determinants of Health   Financial Resource Strain: Low Risk  (01/23/2022)    Overall Financial Resource Strain (CARDIA)    Difficulty of Paying Living Expenses: Not very hard  Food Insecurity: No Food Insecurity (01/23/2022)   Hunger Vital Sign    Worried About Running Out of Food in the Last Year: Never true    Ran Out of Food in the Last Year: Never true  Transportation Needs: No Transportation Needs (01/23/2022)   PRAPARE - Hydrologist (Medical): No  Lack of Transportation (Non-Medical): No  Physical Activity: Inactive (01/23/2022)   Exercise Vital Sign    Days of Exercise per Week: 0 days    Minutes of Exercise per Session: 0 min  Stress: No Stress Concern Present (09/09/2021)   [place]    Feeling of Stress : Only a little  Social Connections: Socially Isolated (09/09/2021)   Social Connection and Isolation Panel [NHANES]    Frequency of Communication with Friends and Family: More than three times a week    Frequency of Social Gatherings with Friends and Family: More than three times a week    Attends Religious Services: Never    Marine scientist or Organizations: No    Attends Archivist Meetings: Never    Marital Status: Never married  Intimate Partner Violence: Not At Risk (09/09/2021)   Humiliation, Afraid, Rape, and Kick questionnaire    Fear of Current or Ex-Partner: No    Emotionally Abused: No    Physically Abused: No    Sexually Abused: No     No Known Allergies   CBC    Component Value Date/Time   WBC 5.7 10/31/2021 1215   RBC 4.02 (L) 10/31/2021 1215   HGB 13.8 10/31/2021 1215   HCT 40.7 10/31/2021 1215   PLT 195 10/31/2021 1215   MCV 101.2 (H) 10/31/2021 1215   MCH 34.3 (H) 10/31/2021 1215   MCHC 33.9 10/31/2021 1215   RDW 12.7 10/31/2021 1215   LYMPHSABS 0.7 09/21/2021 0254   MONOABS 0.6 09/21/2021 0254   EOSABS 0.0 09/21/2021 0254   BASOSABS 0.1 09/21/2021 0254    Pulmonary Functions Testing Results:     No  data to display          Outpatient Medications Prior to Visit  Medication Sig Dispense Refill   atorvastatin (LIPITOR) 40 MG tablet Take 0.5 tablets (20 mg total) by mouth daily. (Patient taking differently: Take 40 mg by mouth daily.) 90 tablet 3   FLUoxetine (PROZAC) 20 MG capsule Take 2 capsules (40 mg total) by mouth daily. 60 capsule 1   pantoprazole (PROTONIX) 40 MG tablet Take 1 tablet (40 mg total) by mouth daily. 90 tablet 3   warfarin (COUMADIN) 4 MG tablet Take 1 tablet (4 mg total) by mouth daily. Take 1 daily Monday  through Friday. Do not take Saturday and Sunday.Take 4 mg by mouth daily. 30 tablet 6   No facility-administered medications prior to visit.

## 2022-02-23 NOTE — Patient Instructions (Signed)
Today, you were seen in our clinic because of a nodule in the right lower part of your lung that is concerning for malignancy. You will require a procedure to biopsy the nodule to know exactly what it is. The procedure is called a robotic assisted navigational bronchoscopy. Given you are on warfarin, you will need to stop the warfarin and take a bridging medicine prior to the procedure (see the schedule below). We will also refer you to the cardiology department to be seen so that they can evaluate your heart as well as to help in the management of stopping and restarting your warfarin prior to the procedure.  ------------------------------------------------------------------------  Day -6 : Last dose of warfarin is day minus 6 prior to the procedure  Day -5: No warfarin or enoxaparin (Lovenox).  Day -4 : Inject enoxaparin80 mg in the fatty abdominal tissue at least 2 inches from the belly button twice a day about 12 hours apart, 8am and 8pm rotate sites. No warfarin.  Day -3: Inject enoxaparin in the fatty tissue every 12 hours, 8am and 8pm. No warfarin.  Day -2: Inject enoxaparin in the fatty tissue every 12 hours, 8am and 8pm. No warfarin.  Day -1: No Lovenox. No warfarin.  Day 0: Procedure Day - No enoxaparin - Resume warfarin in the evening (take an extra half tablet with usual dose for 2 days then resume normal dose).  Day 1: Resume enoxaparin inject in the fatty tissue every 12 hours and take warfarin  Day 2: Inject enoxaparin in the fatty tissue every 12 hours and take warfarin  Day 3: Stop the Lovenox and continue taking the warfarin

## 2022-02-23 NOTE — H&P (View-Only) (Signed)
Synopsis: Referred in for pulmonary nodule by Corky Downs, MD  Assessment & Plan:   #Pulmonary Nodule #Shortness of breath #Longstanding persistent atrial fibrillation  #PAD (peripheral artery disease) (HCC)  Nodule Location:RLL Nodule Size: 11 mm Nodule Spiculation: No Associated Lymphadenopathy: No Smoking Status (current) and 60 pack years Extrathoracic cancer > 5 years prior (no) SPN malignancy risk score [facility]): 60 % risk of malignancy ECOG: 1   The patient is here to discuss their imaging abnormalities which include an FDG avid RLL nodule measuring up to 1.1 cm with associated non-palpable cervical lymph nodes that were small and mildly avid on PET/CT. Given the patient's history of smoking, age, size of nodule, and PET avidity, his [facility] SPN risk score is 60%. He would benefit from a biopsy for a definitive diagnosis. Given he is on warfarin, he would require it to be held with a Lovenox bridge. The patient is unable to navigate the complexity of holding and restarting the warfarin in addition to starting and holding the Lovenox. I spoke to his sister over the phone who is also uncomfortable managing the lovenox bridge. He also has a history of cardiovascular disease and is at high risk for CAD. He would benefit from an evaluation by cardiology for risk stratification prior to undergoing general anesthesia for the robotic assisted navigational bronchoscopy. Given all this, I have placed a referral to our cardiology colleagues for risk stratification and visit to the coumadin clinic.  I discussed the importance of diagnosis and staging in lung malignancies with the patient, and the approach to obtaining a tissue diagnosis which would include robotic assisted navigational bronchoscopy with endobronchial ultrasound guided sampling.  We also discussed the risks associated with the procedure which include a 2% risk of pneumothorax, infection, bleeding, and  nondiagnostic procedure in detail.  I explained that patients typically are able to return home the same day of the procedure, but in rare cases admission to the hospital for observation and treatment is required.  After our discussion, the patient elected to proceed with the procedure  Recommendations: - Robotic Assisted navigational bronchoscopy - Ambulatory referral to Cardiology - ECHOCARDIOGRAM COMPLETE; Future   No follow-ups on file.  I spent 70 minutes caring for this patient today, including preparing to see the patient, obtaining a medical history , reviewing a separately obtained history, performing a medically appropriate examination and/or evaluation, counseling and educating the patient/family/caregiver, ordering medications, tests, or procedures, referring and communicating with other health care professionals (not separately reported), documenting clinical information in the electronic health record, independently interpreting results (not separately reported/billed) and communicating results to the patient/family/caregiver, and care coordination (not separately reported/billed)  Raechel Chute, MD [facility] 02/23/2022 1:41 PM    End of visit medications:  No orders of the defined types were placed in this encounter.    Current Outpatient Medications:    atorvastatin (LIPITOR) 40 MG tablet, Take 0.5 tablets (20 mg total) by mouth daily. (Patient taking differently: Take 40 mg by mouth daily.), Disp: 90 tablet, Rfl: 3   FLUoxetine (PROZAC) 20 MG capsule, Take 2 capsules (40 mg total) by mouth daily., Disp: 60 capsule, Rfl: 1   pantoprazole (PROTONIX) 40 MG tablet, Take 1 tablet (40 mg total) by mouth daily., Disp: 90 tablet, Rfl: 3   warfarin (COUMADIN) 4 MG tablet, Take 1 tablet (4 mg total) by mouth daily. Take 1 daily Monday  through Friday. Do not take Saturday and Sunday.Take 4 mg by mouth daily.,  Disp: 30 tablet, Rfl: 6   Subjective:   PATIENT  ID: Gregory Lane GENDER: male DOB: [birth year], MRN: [id number]  Chief Complaint  Patient presents with   pulmonary consult    PET 02/21/22- hx of COPD. C/o SOB with exertion, wheezing and prod with yellow sputum.     HPI  Gregory Lane is a pleasant 72 year old male presenting to clinic for the evaluation of a pulmonary nodule.  The patient has a history of multiple medical problems that include afib/flutter for which he is on warfarin. He also has a history of PAD and was admitted with critical limb ischemia to the hospital in August of 2023. He underwent abdominal angiography with mechanical thrombectomy to the left SFA, left profunda femoris, and distal left common femoral artery. He was switched from Eliquis to Warfarin during said admission. He also reports chronic shortness of breath for which he is followed by his primary care physician.  He does not have any symptoms of chest tightness, chest pain, or hemoptysis. He does report exertional shortness of breath and a chronic cough that is non-productive. He tells me that his knees are weak and that prevents him from any strenuous exertion. He is not on any inhalers and does not report a history of any recurrent bronchitis, URTI's, or exacerbations.  He is followed closely by his PCP Dr. Juel Burrow who had ordered a PET/CT that was performed on 02/21/2022. The CT was notable for a RLL 1.1 cm nodule that was hypermetabolic. The PET was also notable for three scattered non-enlarged mildly hypermetabolic bilateral upper cervical lymph nodes. No other imaging is available for comparison, with the last chest CT being from 2018.  The patient has a significant history of smoking. He tells me he started smoking at the age of 18, and continues to smoke to this day. He has at least a 60 pack year smoking history. He currently smokes 0.5 packs a day. 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 He currently  lives alone. His sister helps him with his appointments and with some of his medications.  Ancillary information including prior medications, full medical/surgical/family/social histories, and PFTs (when available) are listed below and have been reviewed.    Review of Systems  Constitutional:  Negative for chills, fever, malaise/fatigue and weight loss.  HENT:  Positive for hearing loss.   Respiratory:  Positive for cough and shortness of breath. Negative for hemoptysis, sputum production and wheezing.   Cardiovascular:  Negative for chest pain and palpitations.  Skin:  Negative for rash.     Objective:   Vitals:   02/23/22 1112  BP: 138/80  Pulse: 80  Temp: 98 F (36.7 C)  TempSrc: Temporal  SpO2: 97%  Weight: 224 lb 12.8 oz (102 kg)  Height: 6\' 1"  (1.854 m)   97% on RA  BMI Readings from Last 3 Encounters:  02/23/22 29.66 kg/m  02/21/22 30.27 kg/m  02/09/22 29.69 kg/m   Wt Readings from Last 3 Encounters:  02/23/22 224 lb 12.8 oz (102 kg)  02/21/22 229 lb 6.4 oz (104.1 kg)  02/09/22 225 lb (102.1 kg)    Physical Exam Constitutional:      General: He is not in acute distress.    Appearance: He is obese. He is not ill-appearing.  HENT:     Head: Normocephalic.     Mouth/Throat:     Mouth: Mucous membranes are moist.  Eyes:  Extraocular Movements: Extraocular movements intact.  Cardiovascular:     Rate and Rhythm: Normal rate. Rhythm irregular.     Heart sounds: Normal heart sounds.  Pulmonary:     Effort: Pulmonary effort is normal.     Breath sounds: Normal breath sounds. No wheezing, rhonchi or rales.  Abdominal:     General: There is distension.     Palpations: Abdomen is soft.  Musculoskeletal:        General: Normal range of motion.  Neurological:     General: No focal deficit present.     Mental Status: He is alert. Mental status is at baseline.       Ancillary Information    Past Medical History:  Diagnosis Date   Aortic  atherosclerosis (HCC) 02/16/2017   Arthritis    Blind left eye    Hypertension    Low TSH level 02/17/2017   Myocardial infarction (HCC)    Perforated duodenal ulcer (HCC)    Stroke (HCC)      Family History  Problem Relation Age of Onset   Cirrhosis Father    Hypertension Mother    Lung cancer Sister    Hypertension Sister    Arthritis Sister    Heart murmur Sister      Past Surgical History:  Procedure Laterality Date   EXPLORATORY LAPAROTOMY  05/12/2015   EYE SURGERY     FRACTURE SURGERY     LAPAROTOMY N/A 05/10/2015   Procedure: EXPLORATORY LAPAROTOMY;  Surgeon: Axel Filler, MD;  Location: [facility] OR;  Service: General;  Laterality: N/A;   LEFT HEART CATH AND CORONARY ANGIOGRAPHY N/A 05/02/2016   Procedure: Left Heart Cath and Coronary Angiography;  Surgeon: Yates Decamp, MD;  Location: [facility] INVASIVE CV LAB;  Service: Cardiovascular;  Laterality: N/A;   LOWER EXTREMITY ANGIOGRAPHY Left 09/21/2021   Procedure: Lower Extremity Angiography;  Surgeon: Renford Dills, MD;  Location: [facility] INVASIVE CV LAB;  Service: Cardiovascular;  Laterality: Left;    Social History   Socioeconomic History   Marital status: Single    Spouse name: Not on file   Number of children: Not on file   Years of education: Not on file   Highest education level: Not on file  Occupational History   Not on file  Tobacco Use   Smoking status: Every Day    Packs/day: 0.50    Years: 60.00    Total pack years: 30.00    Types: Cigarettes   Smokeless tobacco: Never   Tobacco comments:    0.5 PPD  Substance and Sexual Activity   Alcohol use: No    Alcohol/week: 60.0 standard drinks of alcohol    Types: 60 Standard drinks or equivalent per week    Comment: daily 12 pk of beer   Drug use: No   Sexual activity: Not Currently  Other Topics Concern   Not on file  Social History Narrative   [place] Veteran. Lives alone   Social Determinants of Health   Financial Resource Strain: Low Risk  (01/23/2022)    Overall Financial Resource Strain (CARDIA)    Difficulty of Paying Living Expenses: Not very hard  Food Insecurity: No Food Insecurity (01/23/2022)   Hunger Vital Sign    Worried About Running Out of Food in the Last Year: Never true    Ran Out of Food in the Last Year: Never true  Transportation Needs: No Transportation Needs (01/23/2022)   PRAPARE - Administrator, Lane Service (Medical): No  Lack of Transportation (Non-Medical): No  Physical Activity: Inactive (01/23/2022)   Exercise Vital Sign    Days of Exercise per Week: 0 days    Minutes of Exercise per Session: 0 min  Stress: No Stress Concern Present (09/09/2021)   [employer] of Occupational Health - Occupational Stress Questionnaire    Feeling of Stress : Only a little  Social Connections: Socially Isolated (09/09/2021)   Social Connection and Isolation Panel [NHANES]    Frequency of Communication with Friends and Family: More than three times a week    Frequency of Social Gatherings with Friends and Family: More than three times a week    Attends Religious Services: Never    Database administrator or Organizations: No    Attends Banker Meetings: Never    Marital Status: Never married  Intimate Partner Violence: Not At Risk (09/09/2021)   Humiliation, Afraid, Rape, and Kick questionnaire    Fear of Current or Ex-Partner: No    Emotionally Abused: No    Physically Abused: No    Sexually Abused: No     No Known Allergies   CBC    Component Value Date/Time   WBC 5.7 10/31/2021 1215   RBC 4.02 (L) 10/31/2021 1215   HGB 13.8 10/31/2021 1215   HCT 40.7 10/31/2021 1215   PLT 195 10/31/2021 1215   MCV 101.2 (H) 10/31/2021 1215   MCH 34.3 (H) 10/31/2021 1215   MCHC 33.9 10/31/2021 1215   RDW 12.7 10/31/2021 1215   LYMPHSABS 0.7 09/21/2021 0254   MONOABS 0.6 09/21/2021 0254   EOSABS 0.0 09/21/2021 0254   BASOSABS 0.1 09/21/2021 0254    Pulmonary Functions Testing Results:     No  data to display          Outpatient Medications Prior to Visit  Medication Sig Dispense Refill   atorvastatin (LIPITOR) 40 MG tablet Take 0.5 tablets (20 mg total) by mouth daily. (Patient taking differently: Take 40 mg by mouth daily.) 90 tablet 3   FLUoxetine (PROZAC) 20 MG capsule Take 2 capsules (40 mg total) by mouth daily. 60 capsule 1   pantoprazole (PROTONIX) 40 MG tablet Take 1 tablet (40 mg total) by mouth daily. 90 tablet 3   warfarin (COUMADIN) 4 MG tablet Take 1 tablet (4 mg total) by mouth daily. Take 1 daily Monday  through Friday. Do not take Saturday and Sunday.Take 4 mg by mouth daily. 30 tablet 6   No facility-administered medications prior to visit.

## 2022-02-27 ENCOUNTER — Telehealth: Payer: Self-pay | Admitting: *Deleted

## 2022-02-27 NOTE — Patient Outreach (Signed)
  Care Coordination   Collaboration phone call  Visit Note   02/27/2022 Name: Gregory Lane MRN: [id number] DOB: 16-Jul-1950  Gregory Lane is a 72 y.o. year old male who sees Cletis Athens, MD for primary care. I  spoke with  Lincoln Maxin form [employer]  What matters to the patients health and wellness today?  [employer]    Goals Addressed             This Visit's Progress    I would like meals on wheels       Care Coordination Interventions: Phone call from [employer]-Kathy Truddie Crumble to confirm status of referral. Lucianne Lei confirmed that patient is next person to go on the route Confirmed that once his name comes up, she will call patient to get him signed up Patient contacted to discuss above, VM left for a return call           SDOH assessments and interventions completed:  No     Care Coordination Interventions:  Yes, provided   Follow up plan: Follow up call scheduled for 03/13/22    Encounter Outcome:  Pt. Visit Completed

## 2022-02-28 ENCOUNTER — Ambulatory Visit
Payer: No Typology Code available for payment source | Attending: Student in an Organized Health Care Education/Training Program

## 2022-02-28 DIAGNOSIS — R0602 Shortness of breath: Secondary | ICD-10-CM | POA: Diagnosis not present

## 2022-02-28 DIAGNOSIS — I739 Peripheral vascular disease, unspecified: Secondary | ICD-10-CM | POA: Diagnosis not present

## 2022-02-28 DIAGNOSIS — I4811 Longstanding persistent atrial fibrillation: Secondary | ICD-10-CM

## 2022-02-28 LAB — ECHOCARDIOGRAM COMPLETE
AR max vel: 2.38 cm2
AV Area VTI: 2.29 cm2
AV Area mean vel: 2.1 cm2
AV Mean grad: 4 mmHg
AV Peak grad: 7.7 mmHg
Ao pk vel: 1.39 m/s
Area-P 1/2: 2.51 cm2
S' Lateral: 3.8 cm

## 2022-02-28 MED ORDER — PERFLUTREN LIPID MICROSPHERE
1.0000 mL | INTRAVENOUS | Status: AC | PRN
  Administered 2022-02-28: 2 mL via INTRAVENOUS

## 2022-03-01 ENCOUNTER — Other Ambulatory Visit: Payer: Self-pay

## 2022-03-01 ENCOUNTER — Ambulatory Visit (INDEPENDENT_AMBULATORY_CARE_PROVIDER_SITE_OTHER): Payer: No Typology Code available for payment source | Admitting: Internal Medicine

## 2022-03-01 ENCOUNTER — Encounter: Payer: Self-pay | Admitting: Internal Medicine

## 2022-03-01 ENCOUNTER — Ambulatory Visit: Payer: No Typology Code available for payment source | Attending: Internal Medicine

## 2022-03-01 VITALS — BP 138/64 | HR 77 | Ht 73.0 in | Wt 226.4 lb

## 2022-03-01 DIAGNOSIS — Z7901 Long term (current) use of anticoagulants: Secondary | ICD-10-CM | POA: Insufficient documentation

## 2022-03-01 DIAGNOSIS — I739 Peripheral vascular disease, unspecified: Secondary | ICD-10-CM

## 2022-03-01 DIAGNOSIS — I480 Paroxysmal atrial fibrillation: Secondary | ICD-10-CM | POA: Diagnosis not present

## 2022-03-01 DIAGNOSIS — Z01810 Encounter for preprocedural cardiovascular examination: Secondary | ICD-10-CM

## 2022-03-01 DIAGNOSIS — I4892 Unspecified atrial flutter: Secondary | ICD-10-CM | POA: Diagnosis not present

## 2022-03-01 DIAGNOSIS — I10 Essential (primary) hypertension: Secondary | ICD-10-CM

## 2022-03-01 DIAGNOSIS — Z5181 Encounter for therapeutic drug level monitoring: Secondary | ICD-10-CM

## 2022-03-01 DIAGNOSIS — I6340 Cerebral infarction due to embolism of unspecified cerebral artery: Secondary | ICD-10-CM

## 2022-03-01 LAB — POCT INR: INR: 1.2 — AB (ref 2.0–3.0)

## 2022-03-01 MED ORDER — ENOXAPARIN SODIUM 100 MG/ML IJ SOSY: 100.0000 mg | PREFILLED_SYRINGE | Freq: Two times a day (BID) | INTRAMUSCULAR | 1 refills | Status: DC

## 2022-03-01 MED ORDER — WARFARIN SODIUM 4 MG PO TABS: 4.0000 mg | ORAL_TABLET | Freq: Every day | ORAL | 4 refills | Status: DC

## 2022-03-01 NOTE — Patient Instructions (Addendum)
Take 2 tablets today only then continue 1 tablet Daily, except none on Saturday or Sunday.  INR in 1 week.  Lung Biopsy TBD.  Lovenox Bridge arranged. [phone]   : Last dose of warfarin.  : No warfarin or enoxaparin (Lovenox).  : Inject enoxaparin 100 mg in the fatty abdominal tissue at least 2 inches from the belly button twice a day about 12 hours apart, 8am and 8pm rotate sites. No warfarin.  : Inject enoxaparin in the fatty tissue every 12 hours, 8am and 8pm. No warfarin.  : Inject enoxaparin in the fatty tissue every 12 hours, 8am and 8pm. No warfarin.  : Inject enoxaparin in the fatty tissue in the morning at 8 am (No PM dose). No warfarin.  : Procedure Day - No enoxaparin - Resume warfarin in the evening or as directed by doctor (take an extra half tablet with usual dose for 2 days then resume normal dose).  : Resume enoxaparin inject in the fatty tissue every 12 hours and take warfarin  : Inject enoxaparin in the fatty tissue every 12 hours and take warfarin  : Inject enoxaparin in the fatty tissue every 12 hours and take warfarin  : Inject enoxaparin in the fatty tissue every 12 hours and take warfarin  : Inject enoxaparin in the fatty tissue every 12 hours and take warfarin  : warfarin appt to check INR.

## 2022-03-01 NOTE — Patient Instructions (Signed)
Medication Instructions:  Your Physician recommend you continue on your current medication as directed.    *If you need a refill on your cardiac medications before your next appointment, please call your pharmacy*   Lab Work: None ordered today   Testing/Procedures: None ordered today   Follow-Up: At [facility], you and your health needs are our priority.  As part of our continuing mission to provide you with exceptional heart care, we have created designated Provider Care Teams.  These Care Teams include your primary Cardiologist (physician) and Advanced Practice Providers (APPs -  Physician Assistants and Nurse Practitioners) who all work together to provide you with the care you need, when you need it.  We recommend signing up for the patient portal called "MyChart".  Sign up information is provided on this After Visit Summary.  MyChart is used to connect with patients for Virtual Visits (Telemedicine).  Patients are able to view lab/test results, encounter notes, upcoming appointments, etc.  Non-urgent messages can be sent to your provider as well.   To learn more about what you can do with MyChart, go to [email or web address].    Your next appointment:   3 month(s)  The format for your next appointment:   In Person  Provider:   You may see Christopher End, MD or one of the following Advanced Practice Providers on your designated Care Team:   Christopher Berge, NP Ryan Dunn, PA-C Cadence Furth, PA-C Sheri Hammock, NP        

## 2022-03-01 NOTE — Progress Notes (Signed)
New Outpatient Visit Date: 03/01/2022  Referring Provider: Armando Reichert, MD [place],  [place] [address]  Chief Complaint: Shortness of breath  HPI:  Gregory Lane is a 72 y.o. male who is being seen today for the evaluation of preoperative cardiovascular risk assessment in anticipation of a lung nodule biopsy at the request of Dr. Genia Harold. He has a history of paroxysmal atrial fibrillation, questionable MI, stroke, PAD (suspected cardioembolic event to left lower extremity in 09/2021), hypertension, hyperlipidemia, and COPD.  Today, he is frustrated because he was under the impression that he would be undergoing transbronchial biopsy of his lung nodule today.  He even stopped taking warfarin 3-4 days ago in anticipation of this.  He has chronic shortness of breath that worsened a few months ago but has been stable since then.  He gets out of breath walking about 50 feet.  He has not had any chest pain or palpitations.  He notes some intermittent dizziness for which she "takes pills" that were previously prescribed by Dr. Lavera Lane (he does not recall the name).  He is unable to climb steps or perform other activities consistent with formats due to his left leg giving out on him as well as shortness of breath.  Gregory Lane notes that he had a heart attack many years ago but does not recall specifics.  He thinks it may have been around the time of his heart catheterization by Dr. Nicolette Bang in 2018, which showed normal coronary arteries.  He also reports having undergone a stress test within the last year (he believes it may have been done through [facility]).  He continues to smoke 1/2 pack/day and is not interested in quitting.  He has not had any bleeding and is generally compliant with his warfarin that has previously been managed by Dr. Lavera Lane.  He was seen by our anticoagulation clinic this morning and advised to restart his warfarin (INR was 1.2).  Instructions have been  provided for periprocedural management of warfarin, including enoxaparin bridging.  --------------------------------------------------------------------------------------------------  Cardiovascular History & Procedures: Cardiovascular Problems: Paroxysmal atrial fibrillation PAD (cardioembolic event to left CVF/SFA/profunda femoris) ? Myocardial infarction (patient does not recall specifics)  Risk Factors: PAD, hypertension, hyperlipidemia, male gender, age > 79, and tobacco use  Cath/PCI: LHC (05/02/2016): Normal coronary arteries other than mild bridging versus vasospasm of OM branch.  Normal LVEF/LVEDP.  CV Surgery: Mechanical thrombectomy left CFA, profunda femoris, and SFA (09/21/2021)  EP Procedures and Devices: None  Non-Invasive Evaluation(s): TTE (02/28/2022): Normal left ventricular systolic function (LVEF 63-14% with grade 1 diastolic dysfunction.  GLS -13.6%.  Normal LV size and wall thickness.  Normal RV size and function.  Normal biatrial size.  Mild-moderate mitral regurgitation.  Otherwise, no significant valvular abnormalities.  Recent CV Pertinent Labs: Lab Results  Component Value Date   CHOL 235 (H) 12/19/2019   HDL 46 12/19/2019   LDLCALC 160 (H) 12/19/2019   TRIG 155 (H) 12/19/2019   CHOLHDL 5.1 (H) 12/19/2019   INR 1.2 (A) 03/01/2022   INR 2.2 02/21/2022   BNP 64.3 02/16/2017   K 4.1 10/31/2021   MG 2.0 09/24/2021   BUN 14 10/31/2021   CREATININE 0.83 10/31/2021   CREATININE 0.91 12/19/2019    --------------------------------------------------------------------------------------------------  Past Medical History:  Diagnosis Date   Aortic atherosclerosis ([place]) 02/16/2017   Arthritis    Blind left eye    Hypertension    Low TSH level 02/17/2017   Myocardial infarction ([place])  Perforated duodenal ulcer ([place])    Stroke [facility])     Past Surgical History:  Procedure Laterality Date   EXPLORATORY LAPAROTOMY  05/12/2015   EYE SURGERY     FRACTURE  SURGERY     LAPAROTOMY N/A 05/10/2015   Procedure: EXPLORATORY LAPAROTOMY;  Surgeon: Ralene Ok, MD;  Location: [place];  Service: General;  Laterality: N/A;   LEFT HEART CATH AND CORONARY ANGIOGRAPHY N/A 05/02/2016   Procedure: Left Heart Cath and Coronary Angiography;  Surgeon: Adrian Prows, MD;  Location: [place] CV LAB;  Service: Cardiovascular;  Laterality: N/A;   LOWER EXTREMITY ANGIOGRAPHY Left 09/21/2021   Procedure: Lower Extremity Angiography;  Surgeon: Katha Cabal, MD;  Location: [place] CV LAB;  Service: Cardiovascular;  Laterality: Left;    Current Meds  Medication Sig   atorvastatin (LIPITOR) 40 MG tablet Take 0.5 tablets (20 mg total) by mouth daily. (Patient taking differently: Take 40 mg by mouth daily.)   enoxaparin (LOVENOX) 100 MG/ML injection Inject 1 mL (100 mg total) into the skin every 12 (twelve) hours.   FLUoxetine (PROZAC) 20 MG capsule Take 2 capsules (40 mg total) by mouth daily.   pantoprazole (PROTONIX) 40 MG tablet Take 1 tablet (40 mg total) by mouth daily.   warfarin (COUMADIN) 4 MG tablet Take 1 tablet (4 mg total) by mouth daily. Take 1 daily Monday  through Friday. Do not take Saturday and Sunday.Take 4 mg by mouth daily.    Allergies: Patient has no known allergies.  Social History   Tobacco Use   Smoking status: Every Day    Packs/day: 0.50    Years: 60.00    Total pack years: 30.00    Types: Cigarettes   Smokeless tobacco: Never   Tobacco comments:    0.5 PPD  Substance Use Topics   Alcohol use: Not Currently    Alcohol/week: 60.0 standard drinks of alcohol    Types: 60 Standard drinks or equivalent per week    Comment: daily 12 pk of beer   Drug use: No    Family History  Problem Relation Age of Onset   Cirrhosis Father    Hypertension Mother    Lung cancer Sister    Hypertension Sister    Arthritis Sister    Heart murmur Sister     Review of Systems: A 12-system review of systems was performed and was negative  except as noted in the HPI.  --------------------------------------------------------------------------------------------------  Physical Exam: BP 138/64 (BP Location: Right Arm, Patient Position: Sitting, Cuff Size: Large)   Pulse 77   Ht 6\' 1"  (1.854 m)   Wt 226 lb 6.4 oz (102.7 kg)   SpO2 94%   BMI 29.87 kg/m  Repeat BP: 154/80 on the left and 150/78 on the right  General: NAD.  Accompanied by his sister. HEENT: No conjunctival pallor or scleral icterus. Neck: Supple without lymphadenopathy, thyromegaly, JVD, or HJR. No carotid bruit. Lungs: Normal work of breathing.  Moderately diminished sounds with scattered rhonchi.  No wheezes or crackles. Heart: Regular rate and rhythm with occasional extrasystoles.  No murmurs. Abd: Bowel sounds present. Soft, NT/ND without hepatosplenomegaly Ext: No lower extremity edema.  1+ pedal pulse bilaterally. Skin: Warm and dry without rash. Neuro: CNIII-XII intact. Strength and fine-touch sensation intact in upper and lower extremities bilaterally. Psych: Normal mood and affect.  EKG: Normal sinus rhythm with frequent PVCs and nonspecific ST changes.  Compared to prior tracing from 10/31/2021, PVCs are now present.  Lab Results  Component Value Date   WBC 5.7 10/31/2021   HGB 13.8 10/31/2021   HCT 40.7 10/31/2021   MCV 101.2 (H) 10/31/2021   PLT 195 10/31/2021    Lab Results  Component Value Date   NA 141 10/31/2021   K 4.1 10/31/2021   CL 110 10/31/2021   CO2 23 10/31/2021   BUN 14 10/31/2021   CREATININE 0.83 10/31/2021   GLUCOSE 115 (H) 10/31/2021   ALT 20 12/19/2019    Lab Results  Component Value Date   CHOL 235 (H) 12/19/2019   HDL 46 12/19/2019   LDLCALC 160 (H) 12/19/2019   TRIG 155 (H) 12/19/2019   CHOLHDL 5.1 (H) 12/19/2019   --------------------------------------------------------------------------------------------------  ASSESSMENT AND PLAN: Paroxysmal atrial fibrillation: Gregory Lane is in sinus rhythm today  with frequent PVCs.  We will plan to continue with warfarin managed by our anticoagulation clinic.  Given history of stroke and cardioembolic events as recently as 09/2021, I agree with enoxaparin bridging for procedures necessitating suspension of warfarin.  Instructions have been provided by our anticoagulation clinic.  Preoperative cardiovascular risk assessment: Gregory Lane reports chronic exertional dyspnea, most likely driven by his underlying lung disease.  His echocardiogram yesterday showed preserved LVEF with grade 1 diastolic dysfunction and mild-moderate MR.  I do not think that his echo findings explain his dyspnea.  He does not have any angina, though he is unable to perform 4 METS of activity due to dyspnea and his left leg giving out on him.  He reports having had a stress test through [facility] within the last year; we will request results of this.  There is report of a prior myocardial infarction, though Gregory Lane cannot provide any details about this.  It should be noted that his LVEF was preserved without wall motion abnormalities on the last echo.  He also had a catheterization in 2018 that showed no CAD.  Even if he has not undergone stress testing, I would not recommend moving forward with ischemia testing in the setting of stable symptoms and planned low risk procedure; additional cardiac testing/intervention would not further mitigate his perioperative risk.  I would recommend checking routine labs including a basic metabolic panel and magnesium level prior to the procedure to ensure that his electrolytes are normal in the setting of PVCs noted today.  PAD: Continue follow-up with vascular surgery.  Hypertension: Blood pressure mildly elevated today.  Defer ongoing management to Dr. Lavera Lane.  PD: I encouraged Mr. Ekman to stop smoking.  Ongoing management per Dr. Genia Harold.  Follow-up: Return to clinic in 3 months.  Nelva Bush, MD 03/01/2022 3:09 PM

## 2022-03-06 ENCOUNTER — Other Ambulatory Visit: Payer: Self-pay | Admitting: Student in an Organized Health Care Education/Training Program

## 2022-03-06 ENCOUNTER — Encounter: Payer: Self-pay | Admitting: Student in an Organized Health Care Education/Training Program

## 2022-03-06 DIAGNOSIS — R911 Solitary pulmonary nodule: Secondary | ICD-10-CM

## 2022-03-06 NOTE — Progress Notes (Signed)
CT has been scheduled and letter mailed to patient with appt information

## 2022-03-08 ENCOUNTER — Telehealth: Payer: Self-pay | Admitting: Student in an Organized Health Care Education/Training Program

## 2022-03-08 ENCOUNTER — Ambulatory Visit: Payer: No Typology Code available for payment source | Attending: Internal Medicine

## 2022-03-08 DIAGNOSIS — I6340 Cerebral infarction due to embolism of unspecified cerebral artery: Secondary | ICD-10-CM | POA: Diagnosis not present

## 2022-03-08 DIAGNOSIS — Z7901 Long term (current) use of anticoagulants: Secondary | ICD-10-CM

## 2022-03-08 DIAGNOSIS — I4892 Unspecified atrial flutter: Secondary | ICD-10-CM

## 2022-03-08 DIAGNOSIS — Z5181 Encounter for therapeutic drug level monitoring: Secondary | ICD-10-CM

## 2022-03-08 LAB — POCT INR: INR: 2.2 (ref 2.0–3.0)

## 2022-03-08 NOTE — Patient Instructions (Signed)
continue 1 tablet Daily, except none on Saturday or Sunday.  INR in 1 week.  Lung Biopsy 1/31.  Lovenox Bridge arranged.  1/25: Last dose of warfarin.  1/26: No warfarin or enoxaparin (Lovenox).  1/27: Inject enoxaparin 100 mg in the fatty abdominal tissue at least 2 inches from the belly button twice a day about 12 hours apart, 8am and 8pm rotate sites. No warfarin.  1/28: Inject enoxaparin in the fatty tissue every 12 hours, 8am and 8pm. No warfarin.  1/29: Inject enoxaparin in the fatty tissue every 12 hours, 8am and 8pm. No warfarin.  1/30: Inject enoxaparin in the fatty tissue in the morning at 8 am (No PM dose). No warfarin.  1/31: Procedure Day - No enoxaparin - Resume warfarin in the evening or as directed by doctor (take an extra half tablet with usual dose for 2 days then resume normal dose).  2/1: Resume enoxaparin inject in the fatty tissue every 12 hours and take warfarin  2/2: Inject enoxaparin in the fatty tissue every 12 hours and take warfarin  2/3: Inject enoxaparin in the fatty tissue every 12 hours and take warfarin  2/4: Inject enoxaparin in the fatty tissue every 12 hours and take warfarin  2/5 and 2/6: Inject enoxaparin in the fatty tissue every 12 hours and take warfarin  2/7: warfarin appt to check INR.

## 2022-03-08 NOTE — Telephone Encounter (Signed)
Noted  

## 2022-03-10 DIAGNOSIS — K219 Gastro-esophageal reflux disease without esophagitis: Secondary | ICD-10-CM | POA: Diagnosis not present

## 2022-03-10 DIAGNOSIS — I6389 Other cerebral infarction: Secondary | ICD-10-CM | POA: Diagnosis not present

## 2022-03-10 DIAGNOSIS — I4892 Unspecified atrial flutter: Secondary | ICD-10-CM | POA: Diagnosis not present

## 2022-03-10 DIAGNOSIS — I340 Nonrheumatic mitral (valve) insufficiency: Secondary | ICD-10-CM | POA: Diagnosis not present

## 2022-03-10 DIAGNOSIS — I739 Peripheral vascular disease, unspecified: Secondary | ICD-10-CM | POA: Diagnosis not present

## 2022-03-10 DIAGNOSIS — F17200 Nicotine dependence, unspecified, uncomplicated: Secondary | ICD-10-CM | POA: Diagnosis not present

## 2022-03-10 DIAGNOSIS — E669 Obesity, unspecified: Secondary | ICD-10-CM | POA: Diagnosis not present

## 2022-03-10 DIAGNOSIS — J449 Chronic obstructive pulmonary disease, unspecified: Secondary | ICD-10-CM | POA: Diagnosis not present

## 2022-03-10 DIAGNOSIS — I509 Heart failure, unspecified: Secondary | ICD-10-CM | POA: Diagnosis not present

## 2022-03-13 ENCOUNTER — Telehealth: Payer: Self-pay | Admitting: *Deleted

## 2022-03-13 ENCOUNTER — Ambulatory Visit: Payer: Self-pay | Admitting: *Deleted

## 2022-03-13 NOTE — Patient Outreach (Signed)
  Care Coordination   03/13/2022 Name: LOWEN BARRINGER MRN: [id number] DOB: 10-Apr-1950   Care Coordination Outreach Attempts:  An unsuccessful telephone outreach was attempted for a scheduled appointment today.  Follow Up Plan:  Additional outreach attempts will be made to offer the patient care coordination information and services.   Encounter Outcome:  Pt. Visit Completed   Care Coordination Interventions:  No, not indicated    Analissa Bayless, LCSW Clinical Social Worker  [facility] Care Management [phone]

## 2022-03-13 NOTE — Patient Instructions (Signed)
Visit Information  Thank you for taking time to visit with me today. Please don't hesitate to contact me if I can be of assistance to you.   Following are the goals we discussed today:   Goals Addressed             This Visit's Progress    I would like meals on wheels       Care Coordination Interventions: Patient confirms that he was contacted by [facility] to confirm status of referral. Jeannetta Ellis has scheduled an assessment appointment for tomorrow at 11am  Patient confirmed no additional needs, however reported that he will have a biopsy done on his lung on 03/22/22 Emotional support provided, patient encouraged to contact this social worker with any additional needs following his biopsy results This social worker will follow up with patient within 30 days           Our next appointment is by telephone on 04/13/22 at 11am  Please call the care guide team at [phone] if you need to cancel or reschedule your appointment.   If you are experiencing a Mental Health or Behavioral Health Crisis or need someone to talk to, please call [phone]   Patient verbalizes understanding of instructions and care plan provided today and agrees to view in MyChart. Active MyChart status and patient understanding of how to access instructions and care plan via MyChart confirmed with patient.     Telephone follow up appointment with care management team member scheduled for: 04/13/22   Verna Czech, LCSW Clinical Social Worker  [facility] Care Management [phone]

## 2022-03-13 NOTE — Patient Outreach (Signed)
  Care Coordination   Follow Up Visit Note   03/13/2022 Name: Gregory Lane MRN: [id number] DOB: Jan 28, 1951  Gregory Lane is a 72 y.o. year old male who sees Gregory Downs, MD for primary care. I spoke with  Gregory Lane by phone today.  What matters to the patients health and wellness today?  [facility] referral    Goals Addressed             This Visit's Progress    I would like meals on wheels       Care Coordination Interventions: Patient confirms that he was contacted by [facility] Gregory Lane to confirm status of referral. Gregory Lane has scheduled an assessment appointment for tomorrow at 11am  Patient confirmed no additional needs, however reported that he will have a biopsy done on his lung on 03/22/22 Emotional support provided, patient encouraged to contact this social worker with any additional needs following his biopsy results This social worker will follow up with patient within 30 days           SDOH assessments and interventions completed:  No     Care Coordination Interventions:  Yes, provided   Follow up plan: Follow up call scheduled for 04/10/22    Encounter Outcome:  Pt. Visit Completed

## 2022-03-15 ENCOUNTER — Telehealth: Payer: Self-pay

## 2022-03-15 NOTE — Telephone Encounter (Signed)
I spoke to patient's sister Hoyle Sauer) and reviewed upcoming schedule for Lovenox Bridge prior and post Lung Biopsy.  She verbalized understanding and appreciated the call.  I told her to call if any questions arise.

## 2022-03-20 ENCOUNTER — Ambulatory Visit
Admission: RE | Admit: 2022-03-20 | Discharge: 2022-03-20 | Disposition: A | Discharge status: Home / Self Care | Payer: No Typology Code available for payment source | Source: Ambulatory Visit | Attending: Student in an Organized Health Care Education/Training Program | Admitting: Student in an Organized Health Care Education/Training Program

## 2022-03-20 ENCOUNTER — Telehealth: Payer: Self-pay | Admitting: Student in an Organized Health Care Education/Training Program

## 2022-03-20 ENCOUNTER — Telehealth: Payer: Self-pay

## 2022-03-20 DIAGNOSIS — J439 Emphysema, unspecified: Secondary | ICD-10-CM | POA: Diagnosis not present

## 2022-03-20 DIAGNOSIS — R911 Solitary pulmonary nodule: Secondary | ICD-10-CM | POA: Insufficient documentation

## 2022-03-20 DIAGNOSIS — R918 Other nonspecific abnormal finding of lung field: Secondary | ICD-10-CM | POA: Diagnosis not present

## 2022-03-20 NOTE — Telephone Encounter (Signed)
Please see duplicate message [phone] phone encounter.

## 2022-03-20 NOTE — Telephone Encounter (Signed)
Lm for patient to confirm upcoming bx.

## 2022-03-20 NOTE — Telephone Encounter (Signed)
Spoke to patient's sister, Carolyn(DPR) and provided bronch instructions. She stated that she received letter with instructions and she did not have any further questions or concerns. She is aware that patient should hold his anticoagulation in coordination with the anti-coagulation clinic and voiced her understanding.  Nothing further needed.

## 2022-03-21 ENCOUNTER — Other Ambulatory Visit: Payer: Self-pay

## 2022-03-21 ENCOUNTER — Encounter (HOSPITAL_COMMUNITY): Payer: Self-pay | Admitting: Student in an Organized Health Care Education/Training Program

## 2022-03-21 NOTE — Progress Notes (Signed)
Spoke with pt's sister, Golden Pop for pre-op call. I spoke with pt first but he was unable to answer some of the questions and I asked him if he wanted me to call her and he said yes. Pt has hx of MI in 2018, no stents placed. Pt has A-fib and takes Warfarin. Dr. Darnelle Bos office instructed pt when to stop Warfarin and to take Lovenox. Last dose of Warfarin was 03/17/22. Lovenox last dose with this evening (03/21/22).  Shower instructions given to Henrietta and she voiced understanding.   Pt will need Covid testing on arrival.

## 2022-03-21 NOTE — Anesthesia Preprocedure Evaluation (Signed)
Anesthesia Evaluation  Patient identified by MRN, date of birth, ID band Patient awake    Reviewed: Allergy & Precautions, NPO status , Patient's Chart, lab work & pertinent test results  Airway Mallampati: II  TM Distance: >3 FB Neck ROM: Full    Dental no notable dental hx. (+) Missing, Edentulous Upper, Dental Advisory Given,    Pulmonary COPD, Current SmokerPatient did not abstain from smoking.   Pulmonary exam normal breath sounds clear to auscultation       Cardiovascular hypertension, + CAD and + Past MI  Normal cardiovascular exam+ dysrhythmias Atrial Fibrillation  Rhythm:Regular Rate:Normal  02/28/2022 Echo 1. Left ventricular ejection fraction, by estimation, is 60 to 65%. The  left ventricle has normal function. The left ventricle has no regional  wall motion abnormalities. Left ventricular diastolic parameters are  consistent with Grade I diastolic  dysfunction (impaired relaxation). The average left ventricular global  longitudinal strain is -13.6 %.   2. Right ventricular systolic function is normal. The right ventricular  size is normal.   3. The mitral valve is normal in structure. Mild to moderate mitral valve  regurgitation. No evidence of mitral stenosis.   4. The aortic valve is normal in structure. Aortic valve regurgitation is  not visualized. Aortic valve sclerosis is present, with no evidence of  aortic valve stenosis.   5. The inferior vena cava is normal in size with greater than 50%  respiratory variability, suggesting right atrial pressure of 3 mmHg.     Neuro/Psych   Anxiety Depression    CVA    GI/Hepatic ,GERD  ,,  Endo/Other    Renal/GU      Musculoskeletal  (+) Arthritis ,    Abdominal   Peds  Hematology   Anesthesia Other Findings   Reproductive/Obstetrics                             Anesthesia Physical Anesthesia Plan  ASA: 3  Anesthesia Plan:  General   Post-op Pain Management: Minimal or no pain anticipated   Induction: Intravenous  PONV Risk Score and Plan: 3 and Treatment may vary due to age or medical condition and Ondansetron  Airway Management Planned: Oral ETT  Additional Equipment: None  Intra-op Plan:   Post-operative Plan: Extubation in OR  Informed Consent: I have reviewed the patients History and Physical, chart, labs and discussed the procedure including the risks, benefits and alternatives for the proposed anesthesia with the patient or authorized representative who has indicated his/her understanding and acceptance.     Dental advisory given  Plan Discussed with: CRNA  Anesthesia Plan Comments: (Lung CA Screening )        Anesthesia Quick Evaluation

## 2022-03-22 ENCOUNTER — Ambulatory Visit
Payer: No Typology Code available for payment source | Admitting: Student in an Organized Health Care Education/Training Program

## 2022-03-22 ENCOUNTER — Ambulatory Visit (HOSPITAL_BASED_OUTPATIENT_CLINIC_OR_DEPARTMENT_OTHER): Payer: No Typology Code available for payment source | Admitting: Anesthesiology

## 2022-03-22 ENCOUNTER — Ambulatory Visit (HOSPITAL_COMMUNITY)
Admission: RE | Admit: 2022-03-22 | Discharge: 2022-03-22 | Disposition: A | Discharge status: Home / Self Care | Payer: No Typology Code available for payment source | Source: Ambulatory Visit | Attending: Student in an Organized Health Care Education/Training Program | Admitting: Student in an Organized Health Care Education/Training Program

## 2022-03-22 ENCOUNTER — Ambulatory Visit (HOSPITAL_COMMUNITY): Payer: No Typology Code available for payment source | Admitting: Anesthesiology

## 2022-03-22 ENCOUNTER — Ambulatory Visit (HOSPITAL_COMMUNITY): Payer: No Typology Code available for payment source

## 2022-03-22 ENCOUNTER — Encounter (HOSPITAL_COMMUNITY): Payer: Self-pay | Admitting: Student in an Organized Health Care Education/Training Program

## 2022-03-22 ENCOUNTER — Other Ambulatory Visit: Payer: Self-pay

## 2022-03-22 ENCOUNTER — Encounter (HOSPITAL_COMMUNITY)
Admission: RE | Disposition: A | Discharge status: Home / Self Care | Payer: Self-pay | Source: Ambulatory Visit | Attending: Student in an Organized Health Care Education/Training Program

## 2022-03-22 ENCOUNTER — Other Ambulatory Visit: Payer: Self-pay | Admitting: Student in an Organized Health Care Education/Training Program

## 2022-03-22 DIAGNOSIS — F17210 Nicotine dependence, cigarettes, uncomplicated: Secondary | ICD-10-CM | POA: Diagnosis not present

## 2022-03-22 DIAGNOSIS — F17200 Nicotine dependence, unspecified, uncomplicated: Secondary | ICD-10-CM | POA: Diagnosis not present

## 2022-03-22 DIAGNOSIS — R911 Solitary pulmonary nodule: Secondary | ICD-10-CM | POA: Diagnosis present

## 2022-03-22 DIAGNOSIS — I252 Old myocardial infarction: Secondary | ICD-10-CM | POA: Diagnosis not present

## 2022-03-22 DIAGNOSIS — C3431 Malignant neoplasm of lower lobe, right bronchus or lung: Secondary | ICD-10-CM | POA: Diagnosis not present

## 2022-03-22 DIAGNOSIS — Z7901 Long term (current) use of anticoagulants: Secondary | ICD-10-CM | POA: Insufficient documentation

## 2022-03-22 DIAGNOSIS — J449 Chronic obstructive pulmonary disease, unspecified: Secondary | ICD-10-CM | POA: Diagnosis not present

## 2022-03-22 DIAGNOSIS — Z1152 Encounter for screening for COVID-19: Secondary | ICD-10-CM | POA: Diagnosis not present

## 2022-03-22 DIAGNOSIS — I4891 Unspecified atrial fibrillation: Secondary | ICD-10-CM | POA: Diagnosis not present

## 2022-03-22 DIAGNOSIS — I10 Essential (primary) hypertension: Secondary | ICD-10-CM | POA: Diagnosis not present

## 2022-03-22 DIAGNOSIS — I2510 Atherosclerotic heart disease of native coronary artery without angina pectoris: Secondary | ICD-10-CM | POA: Insufficient documentation

## 2022-03-22 DIAGNOSIS — I509 Heart failure, unspecified: Secondary | ICD-10-CM | POA: Diagnosis not present

## 2022-03-22 HISTORY — DX: Cardiac arrhythmia, unspecified: I49.9

## 2022-03-22 HISTORY — DX: Gastro-esophageal reflux disease without esophagitis: K21.9

## 2022-03-22 HISTORY — PX: ENDOBRONCHIAL ULTRASOUND: SHX5096

## 2022-03-22 HISTORY — PX: BRONCHIAL NEEDLE ASPIRATION BIOPSY: SHX5106

## 2022-03-22 HISTORY — DX: Chronic obstructive pulmonary disease, unspecified: J44.9

## 2022-03-22 HISTORY — DX: Anxiety disorder, unspecified: F41.9

## 2022-03-22 HISTORY — PX: BRONCHIAL BIOPSY: SHX5109

## 2022-03-22 HISTORY — DX: Depression, unspecified: F32.A

## 2022-03-22 LAB — CBC
HCT: 43.7 % (ref 39.0–52.0)
Hemoglobin: 15 g/dL (ref 13.0–17.0)
MCH: 35.4 pg — ABNORMAL HIGH (ref 26.0–34.0)
MCHC: 34.3 g/dL (ref 30.0–36.0)
MCV: 103.1 fL — ABNORMAL HIGH (ref 80.0–100.0)
Platelets: 184 10*3/uL (ref 150–400)
RBC: 4.24 MIL/uL (ref 4.22–5.81)
RDW: 13.2 % (ref 11.5–15.5)
WBC: 5.7 10*3/uL (ref 4.0–10.5)
nRBC: 0 % (ref 0.0–0.2)

## 2022-03-22 LAB — BASIC METABOLIC PANEL
Anion gap: 9 (ref 5–15)
BUN: 11 mg/dL (ref 8–23)
CO2: 23 mmol/L (ref 22–32)
Calcium: 8.6 mg/dL — ABNORMAL LOW (ref 8.9–10.3)
Chloride: 104 mmol/L (ref 98–111)
Creatinine, Ser: 0.92 mg/dL (ref 0.61–1.24)
GFR, Estimated: >60 mL/min (ref >60)
Glucose, Bld: 111 mg/dL — ABNORMAL HIGH (ref 70–99)
Potassium: 3.7 mmol/L (ref 3.5–5.1)
Sodium: 136 mmol/L (ref 135–145)

## 2022-03-22 LAB — SARS CORONAVIRUS 2 BY RT PCR: SARS Coronavirus 2 by RT PCR: NEGATIVE

## 2022-03-22 LAB — APTT: aPTT: 31 seconds (ref 24–36)

## 2022-03-22 LAB — PROTIME-INR
INR: 1 (ref 0.8–1.2)
Prothrombin Time: 13.4 seconds (ref 11.4–15.2)

## 2022-03-22 SURGERY — BRONCHOSCOPY, WITH BIOPSY USING ELECTROMAGNETIC NAVIGATION
Anesthesia: General

## 2022-03-22 MED ORDER — LIDOCAINE HCL 2 % IJ SOLN
INTRAMUSCULAR | Status: AC
  Filled 2022-03-22: qty 20

## 2022-03-22 MED ORDER — OXYCODONE HCL 5 MG PO TABS: 5.0000 mg | ORAL_TABLET | Freq: Once | ORAL | Status: DC | PRN

## 2022-03-22 MED ORDER — OXYCODONE HCL 5 MG/5ML PO SOLN: 5.0000 mg | Freq: Once | ORAL | Status: DC | PRN

## 2022-03-22 MED ORDER — ACETAMINOPHEN 10 MG/ML IV SOLN: 1000.0000 mg | Freq: Once | INTRAVENOUS | Status: DC | PRN

## 2022-03-22 MED ORDER — CHLORHEXIDINE GLUCONATE 0.12 % MT SOLN
OROMUCOSAL | Status: AC
  Administered 2022-03-22: 15 mL via OROMUCOSAL
  Filled 2022-03-22: qty 15

## 2022-03-22 MED ORDER — ONDANSETRON HCL 4 MG/2ML IJ SOLN
INTRAMUSCULAR | Status: DC | PRN
  Administered 2022-03-22: 4 mg via INTRAVENOUS

## 2022-03-22 MED ORDER — AMISULPRIDE (ANTIEMETIC) 5 MG/2ML IV SOLN: 10.0000 mg | Freq: Once | INTRAVENOUS | Status: DC | PRN

## 2022-03-22 MED ORDER — LIDOCAINE 2% (20 MG/ML) 5 ML SYRINGE
INTRAMUSCULAR | Status: DC | PRN
  Administered 2022-03-22: 80 mg via INTRAVENOUS

## 2022-03-22 MED ORDER — ALBUTEROL SULFATE HFA 108 (90 BASE) MCG/ACT IN AERS
INHALATION_SPRAY | RESPIRATORY_TRACT | Status: DC | PRN
  Administered 2022-03-22: 5 via RESPIRATORY_TRACT

## 2022-03-22 MED ORDER — DEXAMETHASONE SODIUM PHOSPHATE 10 MG/ML IJ SOLN
INTRAMUSCULAR | Status: DC | PRN
  Administered 2022-03-22: 10 mg via INTRAVENOUS

## 2022-03-22 MED ORDER — SUGAMMADEX SODIUM 200 MG/2ML IV SOLN
INTRAVENOUS | Status: DC | PRN
  Administered 2022-03-22: 200 mg via INTRAVENOUS

## 2022-03-22 MED ORDER — HYDROMORPHONE HCL 1 MG/ML IJ SOLN: 0.2500 mg | INTRAMUSCULAR | Status: DC | PRN

## 2022-03-22 MED ORDER — FENTANYL CITRATE (PF) 250 MCG/5ML IJ SOLN
INTRAMUSCULAR | Status: DC | PRN
  Administered 2022-03-22 (×2): 50 ug via INTRAVENOUS

## 2022-03-22 MED ORDER — PROPOFOL 500 MG/50ML IV EMUL
INTRAVENOUS | Status: DC | PRN
  Administered 2022-03-22: 50 ug/kg/min via INTRAVENOUS

## 2022-03-22 MED ORDER — LACTATED RINGERS IV SOLN: INTRAVENOUS | Status: DC

## 2022-03-22 MED ORDER — PROPOFOL 10 MG/ML IV BOLUS
INTRAVENOUS | Status: DC | PRN
  Administered 2022-03-22: 150 mg via INTRAVENOUS

## 2022-03-22 MED ORDER — ONDANSETRON HCL 4 MG/2ML IJ SOLN: 4.0000 mg | Freq: Once | INTRAMUSCULAR | Status: DC | PRN

## 2022-03-22 MED ORDER — PHENYLEPHRINE 80 MCG/ML (10ML) SYRINGE FOR IV PUSH (FOR BLOOD PRESSURE SUPPORT)
PREFILLED_SYRINGE | INTRAVENOUS | Status: DC | PRN
  Administered 2022-03-22 (×3): 80 ug via INTRAVENOUS

## 2022-03-22 MED ORDER — ROCURONIUM BROMIDE 10 MG/ML (PF) SYRINGE
PREFILLED_SYRINGE | INTRAVENOUS | Status: DC | PRN
  Administered 2022-03-22: 70 mg via INTRAVENOUS

## 2022-03-22 MED ORDER — CHLORHEXIDINE GLUCONATE 0.12 % MT SOLN: 15.0000 mL | Freq: Once | OROMUCOSAL | Status: AC

## 2022-03-22 NOTE — Anesthesia Procedure Notes (Signed)
Procedure Name: Intubation Date/Time: 03/22/2022 7:55 AM  Performed by: Betha Loa, CRNAPre-anesthesia Checklist: Patient identified, Emergency Drugs available, Suction available and Patient being monitored Patient Re-evaluated:Patient Re-evaluated prior to induction Oxygen Delivery Method: Circle System Utilized Preoxygenation: Pre-oxygenation with 100% oxygen Induction Type: IV induction Ventilation: Mask ventilation without difficulty and Oral airway inserted - appropriate to patient size Laryngoscope Size: Mac and 4 Grade View: Grade I Tube type: Oral Tube size: 8.5 mm Number of attempts: 1 Airway Equipment and Method: Stylet Placement Confirmation: ETT inserted through vocal cords under direct vision, positive ETCO2 and breath sounds checked- equal and bilateral Secured at: 23 cm Tube secured with: Tape Dental Injury: Teeth and Oropharynx as per pre-operative assessment

## 2022-03-22 NOTE — Op Note (Signed)
Video Bronchoscopy with Robotic Assisted Bronchoscopic Navigation   Date of Operation: 03/22/2022   Pre-op Diagnosis: RLL nodule  Post-op Diagnosis: RLL nodule  Surgeon: Armando Reichert, MD  Assistants: June Leap, MD  Anesthesia: General endotracheal anesthesia  Operation: Flexible video fiberoptic bronchoscopy with robotic assistance and biopsies.  Estimated Blood Loss: Minimal  Complications: None  Indications and History: Gregory Lane is a 72 y.o. male with history of of CAD, PAD, Afib, and COPD presenting for the evaluation of a RLL nodule that was PET/CT avid. The risks, benefits, complications, treatment options and expected outcomes were discussed with the patient.  The possibilities of pneumothorax, pneumonia, reaction to medication, pulmonary aspiration, perforation of a viscus, bleeding, failure to diagnose a condition and creating a complication requiring transfusion or operation were discussed with the patient who freely signed the consent.    Description of Procedure: The patient was seen in the Preoperative Area, was examined and was deemed appropriate to proceed.  The patient was taken to [facility] endoscopy room, identified as Gregory Lane and the procedure verified as Flexible Video Fiberoptic Bronchoscopy, robotic assisted navigational bronchoscopy.  A Time Out was held and the above information confirmed.   Prior to the date of the procedure a high-resolution CT scan of the chest was performed. Utilizing ION software program a virtual tracheobronchial tree was generated to allow the creation of distinct navigation pathways to the patient's parenchymal abnormalities. After being taken to the operating room general anesthesia was initiated and the patient  was orally intubated. The video fiberoptic bronchoscope was introduced via the endotracheal tube and a general inspection was performed which showed normal right and left lung anatomy, aspiration of the bilateral  mainstems was completed to remove any remaining secretions. Robotic catheter inserted into patient's endotracheal tube.   Target #1 RLL: The distinct navigation pathways prepared prior to this procedure were then utilized to navigate to patient's lesion identified on CT scan. We utilized the CIOS spin cone beam  CT to correct for CT to body divergence. The robotic catheter location was re-adjusted. The robotic catheter was secured into place and the vision probe was withdrawn.  Lesion location was approximated using fluoroscopy. Under fluoroscopic guidance transbronchial needle biopsies, and transbronchial forceps biopsies were performed to be sent for cytology and pathology.   At the end of the procedure a general airway inspection was performed and there was no evidence of active bleeding. The bronchoscope was removed. An EBUS scope was introduced and the mediastinal and hilar stations were evaluated. No lymphadenopathy was noted. The patient tolerated the procedure well. There was no significant blood loss and there were no obvious complications. A post-procedural chest x-ray is pending.  Samples Target #1: -Transbronchial 21G FNA biopsies from RLL -Transbronchial forceps biopsies from RLL  Plans:  The patient will be discharged from the PACU to home when recovered from anesthesia and after chest x-ray is reviewed. We will review the cytology, pathology and microbiology results with the patient when they become available. Outpatient followup will be with Armando Reichert, MD.  Armando Reichert, MD [facility] 03/22/2022 8:37 AM

## 2022-03-22 NOTE — Transfer of Care (Signed)
Immediate Anesthesia Transfer of Care Note  Patient: Rykin Route Bales  Procedure(s) Performed: ROBOTIC ASSISTED NAVIGATIONAL BRONCHOSCOPY BRONCHIAL NEEDLE ASPIRATION BIOPSIES BRONCHIAL BIOPSIES ENDOBRONCHIAL ULTRASOUND  Patient Location: PACU  Anesthesia Type:General  Level of Consciousness: awake, alert , oriented, patient cooperative, and responds to stimulation  Airway & Oxygen Therapy: Patient Spontanous Breathing and Patient connected to nasal cannula oxygen  Post-op Assessment: Report given to RN, Post -op Vital signs reviewed and stable, and Patient moving all extremities X 4  Post vital signs: Reviewed and stable  Last Vitals:  Vitals Value Taken Time  BP 95/79 03/22/22 0848  Temp    Pulse 55 03/22/22 0851  Resp 14 03/22/22 0851  SpO2 98 % 03/22/22 0851  Vitals shown include unvalidated device data.  Last Pain:  Vitals:   03/22/22 0659  TempSrc:   PainSc: 0-No pain      Patients Stated Pain Goal: 0 (24/11/46 4314)  Complications: No notable events documented.

## 2022-03-22 NOTE — Interval H&P Note (Signed)
Patient seen and examined. Feeling well, shortness of breath under control with no changes. No chest pain. Cough unchanged.  General: Well-appearing and in no distress Eyes: Anicteric, no conjunctival pallor HEENT: Mucous membranes moist, no evidence of postnasal drip Lymphadenopathy: No cervical or supraclavicular adenopathy Respiratory: Trachea is midline, no respiratory distress, decreased bilateral air entry. Cardiovascular: Heart with irregular rate and rhythm, normal S1 and S2 Gastrointestinal: Normoactive bowel sounds, soft and nontender. Midline scan noted. Musculoskeletal: No clubbing or digital cyanosis, normal range of motion Skin: No rashes on limited exam  A&P: 72 year old male with a history CAD, PAD, Afib (on Warfarin) and COPD presenting for the evaluation of a PET avid RLL nodule. He is scheduled for robotic assisted navigational bronchoscopy to the RLL nodule. He was seen by cardiology prior to the procedure for optimization, and was also seen in the anti-coagulation clinic to help manage his warfarin. He was bridged with Lovenox, and has received his last dose last night. Patient is appropriate for the procedure.

## 2022-03-22 NOTE — Anesthesia Postprocedure Evaluation (Signed)
Anesthesia Post Note  Patient: Seger Jani Ellen  Procedure(s) Performed: ROBOTIC ASSISTED NAVIGATIONAL BRONCHOSCOPY BRONCHIAL NEEDLE ASPIRATION BIOPSIES BRONCHIAL BIOPSIES ENDOBRONCHIAL ULTRASOUND     Patient location during evaluation: PACU Anesthesia Type: General Level of consciousness: awake and alert Pain management: pain level controlled Vital Signs Assessment: post-procedure vital signs reviewed and stable Respiratory status: spontaneous breathing, nonlabored ventilation, respiratory function stable and patient connected to nasal cannula oxygen Cardiovascular status: blood pressure returned to baseline and stable Postop Assessment: no apparent nausea or vomiting Anesthetic complications: no  No notable events documented.  Last Vitals:  Vitals:   03/22/22 0900 03/22/22 0915  BP: (!) 166/68 (!) 176/67  Pulse: (!) 57 (!) 59  Resp: 14 13  Temp:  36.6 C  SpO2: 96% 95%    Last Pain:  Vitals:   03/22/22 0915  TempSrc:   PainSc: 0-No pain                 Barnet Glasgow

## 2022-03-23 ENCOUNTER — Encounter (HOSPITAL_COMMUNITY): Payer: Self-pay | Admitting: Student in an Organized Health Care Education/Training Program

## 2022-03-27 ENCOUNTER — Telehealth: Payer: Self-pay | Admitting: *Deleted

## 2022-03-27 NOTE — Telephone Encounter (Signed)
Nurse placed call to patient to review appointment details for upcoming new oncology consultation visit. Nurse verified appointment details and answered all questions. Patient asked that follow up call be made to his sister Hoyle Sauer with appointment details. Nurse called and left vm for patients sister regarding appointment details.

## 2022-03-28 ENCOUNTER — Encounter: Payer: Self-pay | Admitting: Oncology

## 2022-03-28 ENCOUNTER — Inpatient Hospital Stay: Payer: No Typology Code available for payment source

## 2022-03-28 ENCOUNTER — Inpatient Hospital Stay: Payer: No Typology Code available for payment source | Admitting: Licensed Clinical Social Worker

## 2022-03-28 ENCOUNTER — Encounter: Payer: Self-pay | Admitting: *Deleted

## 2022-03-28 ENCOUNTER — Inpatient Hospital Stay: Payer: No Typology Code available for payment source | Attending: Oncology | Admitting: Oncology

## 2022-03-28 VITALS — BP 139/73 | HR 55 | Temp 97.0°F | Resp 18 | Wt 228.9 lb

## 2022-03-28 DIAGNOSIS — Z139 Encounter for screening, unspecified: Secondary | ICD-10-CM

## 2022-03-28 DIAGNOSIS — Z79899 Other long term (current) drug therapy: Secondary | ICD-10-CM | POA: Insufficient documentation

## 2022-03-28 DIAGNOSIS — R590 Localized enlarged lymph nodes: Secondary | ICD-10-CM | POA: Insufficient documentation

## 2022-03-28 DIAGNOSIS — C3431 Malignant neoplasm of lower lobe, right bronchus or lung: Secondary | ICD-10-CM

## 2022-03-28 DIAGNOSIS — F17210 Nicotine dependence, cigarettes, uncomplicated: Secondary | ICD-10-CM | POA: Diagnosis not present

## 2022-03-28 LAB — CYTOLOGY - NON PAP

## 2022-03-28 NOTE — Progress Notes (Signed)
Met with patient during initial consult with Dr. Grayland Ormond. All questions answered during visit. Reviewed upcoming appts with patient. Contact info given and instructed to call with any questions or needs. Pt verbalized understanding. Nothing further needed at this time.

## 2022-03-28 NOTE — Progress Notes (Signed)
Patient here today for initial evaluation regarding lung cancer. Patient had bronchoscopy on 1/31. Patient reports shortness of breath.

## 2022-03-28 NOTE — Progress Notes (Signed)
[place] Work  Initial Assessment   Gregory Lane is a 72 y.o. year old male contacted by phone. Clinical Social Work was referred by medical provider for assessment of psychosocial needs.   SDOH (Social Determinants of Health) assessments performed: Yes SDOH Interventions    Flowsheet Row Office Visit from 03/28/2022 in [place] [facility] from 01/23/2022 in [place] from 09/09/2021 in [facility]  SDOH Interventions     Food Insecurity Interventions AMB Referral Intervention Not Indicated Intervention Not Indicated  Housing Interventions Intervention Not Indicated Intervention Not Indicated Intervention Not Indicated  Transportation Interventions Intervention Not Indicated Intervention Not Indicated Intervention Not Indicated  Financial Strain Interventions -- Intervention Not Indicated Intervention Not Indicated  Physical Activity Interventions -- Intervention Not Indicated Intervention Not Indicated  Stress Interventions -- -- Intervention Not Indicated  Social Connections Interventions -- -- Intervention Not Indicated       SDOH Screenings   Food Insecurity: Food Insecurity Present (03/28/2022)  Housing: Low Risk  (03/28/2022)  Transportation Needs: No Transportation Needs (03/28/2022)  Utilities: Not At Risk (03/28/2022)  Alcohol Screen: Low Risk  (09/09/2021)  Depression (PHQ2-9): Low Risk  (01/23/2022)  Financial Resource Strain: Low Risk  (01/23/2022)  Physical Activity: Inactive (01/23/2022)  Social Connections: Socially Isolated (09/09/2021)  Stress: No Stress Concern Present (09/09/2021)  Tobacco Use: High Risk (03/28/2022)     Distress Screen completed: Yes     No data to display            Family/Social Information:  Housing Arrangement: patient lives alone, main contact is patient's sister Golden Pop [phone]   Family  members/support persons in your life? Family and Medical Staff Transportation concerns: no  Employment: Retired  .  Income source: Paediatric nurse concerns: No Type of concern: None Food access concerns: no, patient's sister Ms. Caprice Beaver stated the patient does not have food security concerns Religious or spiritual practice: Yes-Christian Services Currently in place:  devoted health  Coping/ Adjustment to diagnosis: Patient understands treatment plan and what happens next? Yes, patient's sister assist with medical concerns and questions Concerns about diagnosis and/or treatment: I'm not especially worried about anything Patient reported stressors: Finances, Adjusting to my illness, and Physical issues Hopes and/or priorities: N/A Patient enjoys time with family/ friends Current coping skills/ strengths: Armed forces logistics/support/administrative officer , Scientist, research (life sciences) , Motivation for treatment/growth , Supportive family/friends , and Work skills     SUMMARY: Current SDOH Barriers:  Financial constraints related to fixed income and Limited social support  Clinical Social Work Clinical Goal(s):  No clinical social work goals at this time  Interventions: Discussed common feeling and emotions when being diagnosed with cancer, and the importance of support during treatment Informed patient of the support team roles and support services at [facility] Provided [place] contact information and encouraged patient to call with any questions or concerns Referred patient to [employer] and Provided patient with information about CSW role inpatient care and other available resources   Follow Up Plan: Patient will contact CSW with any support or resource needs Patient verbalizes understanding of plan: Yes    [employer], LCSW

## 2022-03-28 NOTE — Progress Notes (Signed)
[place]  [phone] [phone]  ID: [name] OB: 1951-02-17  MR#: [id number]  ZDG#:[id number]  Patient Care Team: Cletis Athens, MD as PCP - General (Internal Medicine) End, Harrell Gave, MD as PCP - Cardiology (Cardiology)  CHIEF COMPLAINT: Stage IA2 adenocarcinoma right lower lobe lung.  INTERVAL HISTORY: Patient is a 72 year old male who was noted to have a right lower lobe pulmonary nodule on chest x-ray.  Subsequent CT scan, PET scan, and biopsy revealed the above-stated malignancy.  He currently feels well and is at his baseline.  He has no neurologic complaints.  He denies any recent fevers or illnesses.  He has a good appetite and denies weight loss.  He has no chest pain, shortness of breath, cough, or hemoptysis.  He denies any nausea, vomiting, constipation, or diarrhea.  He has no urinary complaints.  Patient offers no further specific complaints today.  REVIEW OF SYSTEMS:   Review of Systems  Constitutional: Negative.  Negative for fever, malaise/fatigue and weight loss.  Respiratory: Negative.  Negative for cough, hemoptysis and shortness of breath.   Cardiovascular: Negative.  Negative for chest pain and leg swelling.  Gastrointestinal: Negative.  Negative for abdominal pain.  Genitourinary: Negative.  Negative for dysuria.  Musculoskeletal:  Positive for joint pain. Negative for back pain.  Skin: Negative.  Negative for rash.  Neurological: Negative.  Negative for dizziness, focal weakness, weakness and headaches.  Psychiatric/Behavioral: Negative.  The patient is not nervous/anxious.     As per HPI. Otherwise, a complete review of systems is negative.  PAST MEDICAL HISTORY: Past Medical History:  Diagnosis Date   Anxiety    Aortic atherosclerosis ([place]) 02/16/2017   Arthritis    Blind left eye    COPD (chronic obstructive pulmonary disease) (HCC)    Depression    Dysrhythmia    A-fib   GERD (gastroesophageal  reflux disease)    Hypertension    Low TSH level 02/17/2017   Myocardial infarction [facility])    PAD (peripheral artery disease) (HCC)    Paroxysmal atrial fibrillation ([place])    Perforated duodenal ulcer ([place])    Stroke ([place])    left side is weak    PAST SURGICAL HISTORY: Past Surgical History:  Procedure Laterality Date   BRONCHIAL BIOPSY  03/22/2022   Procedure: BRONCHIAL BIOPSIES;  Surgeon: Armando Reichert, MD;  Location: [facility] [facility];  Service: Pulmonary;;   BRONCHIAL NEEDLE ASPIRATION BIOPSY  03/22/2022   Procedure: BRONCHIAL NEEDLE ASPIRATION BIOPSIES;  Surgeon: Armando Reichert, MD;  Location: [place] [facility];  Service: Pulmonary;;   ENDOBRONCHIAL ULTRASOUND  03/22/2022   Procedure: ENDOBRONCHIAL ULTRASOUND;  Surgeon: Armando Reichert, MD;  Location: [place];  Service: Pulmonary;;   EXPLORATORY LAPAROTOMY  05/12/2015   EYE SURGERY     FRACTURE SURGERY     LAPAROTOMY N/A 05/10/2015   Procedure: EXPLORATORY LAPAROTOMY;  Surgeon: Ralene Ok, MD;  Location: [facility];  Service: General;  Laterality: N/A;   LEFT HEART CATH AND CORONARY ANGIOGRAPHY N/A 05/02/2016   Procedure: Left Heart Cath and Coronary Angiography;  Surgeon: Adrian Prows, MD;  Location: [place] [facility];  Service: Cardiovascular;  Laterality: N/A;   LOWER EXTREMITY ANGIOGRAPHY Left 09/21/2021   Procedure: Lower Extremity Angiography;  Surgeon: Katha Cabal, MD;  Location: [place] [facility];  Service: Cardiovascular;  Laterality: Left;    FAMILY HISTORY: Family History  Problem Relation Age of Onset   Hypertension Mother    Dementia Mother    Cirrhosis Father  Cancer Father    Lung cancer Sister    Hypertension Sister    Arthritis Sister    Heart murmur Sister    Arrhythmia Sister     ADVANCED DIRECTIVES (Y/N):  N  HEALTH MAINTENANCE: Social History   Tobacco Use   Smoking status: Every Day    Packs/day: 0.50    Years: 65.00    Total pack years: 32.50    Types: Cigarettes   Smokeless tobacco:  Never   Tobacco comments:    0.5 PPD  Vaping Use   Vaping Use: Never used  Substance Use Topics   Alcohol use: Not Currently    Alcohol/week: 60.0 standard drinks of alcohol    Types: 60 Standard drinks or equivalent per week    Comment: daily 12 pk of beer   Drug use: No     Colonoscopy:  PAP:  Bone density:  Lipid panel:  No Known Allergies  Current Outpatient Medications  Medication Sig Dispense Refill   atorvastatin (LIPITOR) 40 MG tablet Take 0.5 tablets (20 mg total) by mouth daily. (Patient taking differently: Take 40 mg by mouth daily.) 90 tablet 3   enoxaparin (LOVENOX) 100 MG/ML injection Inject 1 mL (100 mg total) into the skin every 12 (twelve) hours. 20 mL 1   FLUoxetine (PROZAC) 20 MG capsule Take 2 capsules (40 mg total) by mouth daily. 60 capsule 1   pantoprazole (PROTONIX) 40 MG tablet Take 1 tablet (40 mg total) by mouth daily. 90 tablet 3   warfarin (COUMADIN) 4 MG tablet Take 1 tablet (4 mg total) by mouth daily. Take 1 daily Monday  through Friday. Do not take Saturday and Sunday.Take 4 mg by mouth daily. 60 tablet 4   No current facility-administered medications for this visit.    OBJECTIVE: Vitals:   03/28/22 1117 03/28/22 1119  BP:  139/73  Pulse:  (!) 55  Resp: 18   Temp:  (!) 97 F (36.1 C)  SpO2:  98%     Body mass index is 30.2 kg/m.    ECOG FS:1 - Symptomatic but completely ambulatory  General: Well-developed, well-nourished, no acute distress.  Sitting in a wheelchair. Eyes: Pink conjunctiva, anicteric sclera. HEENT: Normocephalic, moist mucous membranes. Lungs: No audible wheezing or coughing. Heart: Regular rate and rhythm. Abdomen: Soft, nontender, no obvious distention. Musculoskeletal: No edema, cyanosis, or clubbing. Neuro: Alert, answering all questions appropriately. Cranial nerves grossly intact. Skin: No rashes or petechiae noted. Psych: Normal affect. Lymphatics: No cervical, calvicular, axillary or inguinal LAD.   LAB  RESULTS:  Lab Results  Component Value Date   NA 136 03/22/2022   K 3.7 03/22/2022   CL 104 03/22/2022   CO2 23 03/22/2022   GLUCOSE 111 (H) 03/22/2022   BUN 11 03/22/2022   CREATININE 0.92 03/22/2022   CALCIUM 8.6 (L) 03/22/2022   PROT 7.4 12/19/2019   ALBUMIN 3.7 02/16/2017   AST 13 12/19/2019   ALT 20 12/19/2019   ALKPHOS 71 02/16/2017   BILITOT 0.5 12/19/2019   GFRNONAA >60 03/22/2022   GFRAA 98 12/19/2019    Lab Results  Component Value Date   WBC 5.7 03/22/2022   NEUTROABS 9.3 (H) 09/21/2021   HGB 15.0 03/22/2022   HCT 43.7 03/22/2022   MCV 103.1 (H) 03/22/2022   PLT 184 03/22/2022     STUDIES: DG Chest Port 1 View  Result Date: 03/22/2022 CLINICAL DATA:  Congestive heart failure. Right lower lobe bronchoscopy for hypermetabolic right lower lobe nodule. EXAM: PORTABLE CHEST  1 VIEW COMPARISON:  Multiple exams, including 10/31/2021 and CT scan 03/20/2022 FINDINGS: Mildly accentuated hazy density at the right lung base just above the diaphragm, corresponding to the location of the pulmonary nodule on CT, potentially reflecting a combination of nodule and subtle post biopsy related blood products. No pneumothorax.  The lungs appear otherwise clear. Upper normal heart size. IMPRESSION: 1. Mildly accentuated hazy density at the right lung base just above the diaphragm, corresponding to the location of the pulmonary nodule on CT, potentially reflecting a combination of nodule and subtle post biopsy blood products. No pneumothorax. Electronically Signed   By: Van Clines M.D.   On: 03/22/2022 09:27   DG C-ARM BRONCHOSCOPY  Result Date: 03/22/2022 C-ARM BRONCHOSCOPY: Fluoroscopy was utilized by the requesting physician.  No radiographic interpretation.   CT SUPER D CHEST WO MONARCH PILOT  Result Date: 03/21/2022 CLINICAL DATA:  Follow-up hypermetabolic solid right lower lobe pulmonary nodule. * Tracking Code: BO * EXAM: CT CHEST WITHOUT CONTRAST TECHNIQUE:  Multidetector CT imaging of the chest was performed using thin slice collimation for electromagnetic bronchoscopy planning purposes, without intravenous contrast. RADIATION DOSE REDUCTION: This exam was performed according to the departmental dose-optimization program which includes automated exposure control, adjustment of the mA and/or kV according to patient size and/or use of iterative reconstruction technique. COMPARISON:  02/16/2022 PET-CT. FINDINGS: Motion degraded scan, limiting assessment. Cardiovascular: Normal heart size. No significant pericardial effusion/thickening. In the left anterior descending and right coronary atherosclerosis. Atherosclerotic nonaneurysmal thoracic aorta. Dilated main pulmonary artery (3.6 cm diameter). Mediastinum/Nodes: No significant thyroid nodules. Unremarkable esophagus. No pathologically enlarged axillary, mediastinal or hilar lymph nodes, noting limited sensitivity for the detection of hilar adenopathy on this noncontrast study. Lungs/Pleura: No pneumothorax. No pleural effusion. Mild centrilobular emphysema with diffuse bronchial wall thickening and saber sheath trachea. Slightly spiculated solid 1.3 x 1.1 cm anterior right lower lobe pulmonary nodule, mildly increased from 1.1 x 0.9 cm on 02/16/2022 PET-CT. Two small satellite subpleural peripheral anterior right lower lobe solid pulmonary nodules measuring 0.4 cm (series 3/images 102 and 110), not definitely changed. Upper abdomen: No acute abnormality. Musculoskeletal: No aggressive appearing focal osseous lesions. Mild thoracic spondylosis. IMPRESSION: 1. Slightly spiculated solid 1.3 cm anterior right lower lobe pulmonary nodule, mildly increased from 1.1 cm on 02/16/2022 PET-CT, where it was seen to be hypermetabolic, suspicious for primary bronchogenic carcinoma. 2. Two small satellite subpleural anterior right lower lobe solid pulmonary nodules measuring 0.4 cm. 3. No thoracic adenopathy. 4. Two-vessel coronary  atherosclerosis. 5. Dilated main pulmonary artery, suggesting pulmonary arterial hypertension. 6. Mild centrilobular emphysema with diffuse bronchial wall thickening and saber sheath trachea, suggesting COPD. 7. Aortic Atherosclerosis (ICD10-I70.0) and Emphysema (ICD10-J43.9). Electronically Signed   By: Ilona Sorrel M.D.   On: 03/21/2022 08:50   ECHOCARDIOGRAM COMPLETE  Result Date: 02/28/2022    ECHOCARDIOGRAM REPORT   Patient Name:   Gregory Lane Date of Exam: 02/28/2022 Medical Rec #:  [id number]        Height:       73.0 in Accession #:    [id number]       Weight:       224.8 lb Date of Birth:  [birth year]        BSA:          2.261 m Patient Age:    53 years         BP:           140/82 mmHg Patient Gender: M  HR:           58 bpm. Exam Location:  [place] Procedure: 2D Echo, 3D Echo, Cardiac Doppler, Color Doppler, Strain Analysis and            Intracardiac Opacification Agent Indications:    R06.9 DOE  History:        Patient has prior history of Echocardiogram examinations, most                 recent 09/21/2021. CAD and Previous Myocardial Infarction, Stroke,                 PAD and COPD, Arrythmias:Atrial Flutter, Signs/Symptoms:Dyspnea;                 Risk Factors:Hypertension, Current Smoker and Dyslipidemia.  Sonographer:    Wilkie Aye RVT RCS Referring Phys: [id number] KHABIB DGAYLI  Sonographer Comments: Suboptimal parasternal window. IMPRESSIONS  1. Left ventricular ejection fraction, by estimation, is 60 to 65%. The left ventricle has normal function. The left ventricle has no regional wall motion abnormalities. Left ventricular diastolic parameters are consistent with Grade I diastolic dysfunction (impaired relaxation). The average left ventricular global longitudinal strain is -13.6 %.  2. Right ventricular systolic function is normal. The right ventricular size is normal.  3. The mitral valve is normal in structure. Mild to moderate mitral valve regurgitation. No evidence of  mitral stenosis.  4. The aortic valve is normal in structure. Aortic valve regurgitation is not visualized. Aortic valve sclerosis is present, with no evidence of aortic valve stenosis.  5. The inferior vena cava is normal in size with greater than 50% respiratory variability, suggesting right atrial pressure of 3 mmHg. Comparison(s): 09/21/21 EF 55-60%. FINDINGS  Left Ventricle: Left ventricular ejection fraction, by estimation, is 60 to 65%. The left ventricle has normal function. The left ventricle has no regional wall motion abnormalities. The average left ventricular global longitudinal strain is -13.6 %. The left ventricular internal cavity size was normal in size. There is no left ventricular hypertrophy. Left ventricular diastolic parameters are consistent with Grade I diastolic dysfunction (impaired relaxation). Right Ventricle: The right ventricular size is normal. No increase in right ventricular wall thickness. Right ventricular systolic function is normal. Left Atrium: Left atrial size was normal in size. Right Atrium: Right atrial size was normal in size. Pericardium: There is no evidence of pericardial effusion. Mitral Valve: The mitral valve is normal in structure. Mild to moderate mitral valve regurgitation. No evidence of mitral valve stenosis. Tricuspid Valve: The tricuspid valve is normal in structure. Tricuspid valve regurgitation is not demonstrated. No evidence of tricuspid stenosis. Aortic Valve: The aortic valve is normal in structure. Aortic valve regurgitation is not visualized. Aortic valve sclerosis is present, with no evidence of aortic valve stenosis. Aortic valve mean gradient measures 4.0 mmHg. Aortic valve peak gradient measures 7.7 mmHg. Aortic valve area, by VTI measures 2.29 cm. Pulmonic Valve: The pulmonic valve was normal in structure. Pulmonic valve regurgitation is not visualized. No evidence of pulmonic stenosis. Aorta: The aortic root is normal in size and structure. Venous:  The inferior vena cava is normal in size with greater than 50% respiratory variability, suggesting right atrial pressure of 3 mmHg. IAS/Shunts: No atrial level shunt detected by color flow Doppler.  LEFT VENTRICLE PLAX 2D LVIDd:         5.30 cm   Diastology LVIDs:         3.80 cm   LV e' medial:  4.67 cm/s LV PW:         0.90 cm   LV E/e' medial:  12.0 LV IVS:        1.20 cm   LV e' lateral:   7.76 cm/s LVOT diam:     2.10 cm   LV E/e' lateral: 7.2 LV SV:         60 LV SV Index:   26        2D Longitudinal Strain LVOT Area:     3.46 cm  2D Strain GLS Avg:     -13.6 %                           3D Volume EF:                          3D EF:        64 %                          LV EDV:       173 ml                          LV ESV:       63 ml                          LV SV:        111 ml RIGHT VENTRICLE             IVC RV Basal diam:  3.50 cm     IVC diam: 1.30 cm RV Mid diam:    2.40 cm RV S prime:     12.30 cm/s TAPSE (M-mode): 2.4 cm LEFT ATRIUM             Index        RIGHT ATRIUM           Index LA diam:        4.50 cm 1.99 cm/m   RA Area:     15.60 cm LA Vol (A2C):   49.1 ml 21.71 ml/m  RA Volume:   39.20 ml  17.34 ml/m LA Vol (A4C):   53.3 ml 23.57 ml/m LA Biplane Vol: 55.9 ml 24.72 ml/m  AORTIC VALVE                    PULMONIC VALVE AV Area (Vmax):    2.38 cm     PV Vmax:       0.77 m/s AV Area (Vmean):   2.10 cm     PV Peak grad:  2.4 mmHg AV Area (VTI):     2.29 cm AV Vmax:           139.00 cm/s AV Vmean:          95.500 cm/s AV VTI:            0.260 m AV Peak Grad:      7.7 mmHg AV Mean Grad:      4.0 mmHg LVOT Vmax:         95.60 cm/s LVOT Vmean:        57.900 cm/s LVOT VTI:          0.172 m LVOT/AV VTI ratio: 0.66  AORTA Ao Root diam: 3.50 cm Ao Asc diam:  3.70 cm Ao Arch diam: 2.6 cm MITRAL VALVE MV Area (PHT): 2.51 cm    SHUNTS MV Decel Time: 302 msec    Systemic VTI:  0.17 m MV E velocity: 56.20 cm/s  Systemic Diam: 2.10 cm MV A velocity: 85.30 cm/s MV E/A ratio:  0.66 Ida Rogue MD  Electronically signed by Ida Rogue MD Signature Date/Time: 02/28/2022/4:23:31 PM    Final     ASSESSMENT: Stage IA2 adenocarcinoma right lower lobe lung.  PLAN:    Stage IA2 adenocarcinoma right lower lobe lung: PET scan and biopsy results reviewed independently.  Case also discussed with pulmonology.  Given the stage of disease, patient does not require chemotherapy.  He is likely not a surgical candidate.  Referral has been made to radiation oncology for definitive treatment of his malignancy.  Patient will return to clinic 3 months after the conclusion of his radiation treatments with repeat PET scan and further evaluation. Neck lymphadenopathy: Nonenlarged mildly hypermetabolic lymph nodes were noted on PET scan, nonspecific and possibly reactive.  Will monitor closely.  Repeat PET scan as above.  I spent a total of 60 minutes reviewing chart data, face-to-face evaluation with the patient, counseling and coordination of care as detailed above.   Patient expressed understanding and was in agreement with this plan. He also understands that He can call clinic at any time with any questions, concerns, or complaints.    Cancer Staging  Adenocarcinoma of lower lobe of right lung [facility]) Staging form: Lung, AJCC 8th Edition - Clinical stage from 03/28/2022: Stage IA2 (cT1b, cN0, cM0) - Signed by Lloyd Huger, MD on 03/28/2022 Stage prefix: Initial diagnosis   Lloyd Huger, MD   03/28/2022 12:50 PM

## 2022-03-28 NOTE — Progress Notes (Deleted)
[place] Work  Initial Assessment   Gregory Lane is a 72 y.o. year old male contacted by phone. Clinical Social Work was referred by medical provider for assessment of psychosocial needs.   SDOH (Social Determinants of Health) assessments performed: Yes SDOH Interventions    Flowsheet Row Office Visit from 03/28/2022 in [facility] [place] from 01/23/2022 in [facility] from 09/09/2021 in [facility]  SDOH Interventions     Food Insecurity Interventions AMB Referral Intervention Not Indicated Intervention Not Indicated  Housing Interventions Intervention Not Indicated Intervention Not Indicated Intervention Not Indicated  Transportation Interventions Intervention Not Indicated Intervention Not Indicated Intervention Not Indicated  Financial Strain Interventions -- Intervention Not Indicated Intervention Not Indicated  Physical Activity Interventions -- Intervention Not Indicated Intervention Not Indicated  Stress Interventions -- -- Intervention Not Indicated  Social Connections Interventions -- -- Intervention Not Indicated       SDOH Screenings   Food Insecurity: Food Insecurity Present (03/28/2022)  Housing: Low Risk  (03/28/2022)  Transportation Needs: No Transportation Needs (03/28/2022)  Utilities: Not At Risk (03/28/2022)  Alcohol Screen: Low Risk  (09/09/2021)  Depression (PHQ2-9): Low Risk  (01/23/2022)  Financial Resource Strain: Low Risk  (01/23/2022)  Physical Activity: Inactive (01/23/2022)  Social Connections: Socially Isolated (09/09/2021)  Stress: No Stress Concern Present (09/09/2021)  Tobacco Use: High Risk (03/28/2022)     Distress Screen completed: No     No data to display            Family/Social Information:  Housing Arrangement: patient lives alone, main contact/caregiver patient's sister Gregory Lane [phone] Family members/support persons in your life? Family and Medical Staff Transportation concerns: no  Employment: Retired  .  Income source: Paediatric nurse concerns:  No immediate financial concerns identified Type of concern: None Food access concerns: no Religious or spiritual practice: Yes-Christian Services Currently in place:  [place]  Coping/ Adjustment to diagnosis: Patient understands treatment plan and what happens next? yes, patient's main contact/caregiver assisting with medical concerns and questions Concerns about diagnosis and/or treatment: How will I care for myself and Quality of life Patient reported stressors: Finances, Adjusting to my illness, and Physical issues Hopes and/or priorities: N/A Patient enjoys time with family/ friends Current coping skills/ strengths: Capable of independent living , Scientist, research (life sciences) , Physical Health , Supportive family/friends , and Work skills     SUMMARY: Current SDOH Barriers:  Financial constraints related to fixed income and Limited social support  Clinical Social Work Clinical Goal(s):  No clinical social work goals at this time  Interventions: Discussed common feeling and emotions when being diagnosed with cancer, and the importance of support during treatment Informed patient of the support team roles and support services at [facility] Provided [place] contact information and encouraged patient to call with any questions or concerns Referred patient to [employer] and Provided patient with information about CSW role in patient care and other available resources   Follow Up Plan: Patient will contact CSW with any support or resource needs Patient verbalizes understanding of plan: Yes    [employer], LCSW

## 2022-03-29 ENCOUNTER — Ambulatory Visit: Payer: No Typology Code available for payment source | Attending: Internal Medicine

## 2022-03-29 DIAGNOSIS — Z5181 Encounter for therapeutic drug level monitoring: Secondary | ICD-10-CM

## 2022-03-29 DIAGNOSIS — Z7901 Long term (current) use of anticoagulants: Secondary | ICD-10-CM | POA: Diagnosis not present

## 2022-03-29 DIAGNOSIS — I4892 Unspecified atrial flutter: Secondary | ICD-10-CM | POA: Diagnosis not present

## 2022-03-29 DIAGNOSIS — I6340 Cerebral infarction due to embolism of unspecified cerebral artery: Secondary | ICD-10-CM

## 2022-03-29 LAB — POCT INR: INR: 1.2 — AB (ref 2.0–3.0)

## 2022-03-29 NOTE — Patient Instructions (Signed)
TAKE 2 TABLETS TONIGHT AND THURSDAY THEN continue 1 tablet Daily, except none on Saturday or Sunday.  INR in 2 weeks.

## 2022-03-30 ENCOUNTER — Ambulatory Visit
Payer: No Typology Code available for payment source | Admitting: Student in an Organized Health Care Education/Training Program

## 2022-03-30 NOTE — Progress Notes (Signed)
Called to speak with patient regarding Gregory Lane, left a voicemail asking that he return my call.

## 2022-03-30 NOTE — Progress Notes (Signed)
Spoke with patients sister Golden Pop, they are not interested in applying for the Gerlene Fee at this time. She stated that they will mainly need assistance with his bills through the Person for his treatment. I referred them to Lily Peer to see if maybe they would qualify for harship assistance.

## 2022-04-04 ENCOUNTER — Encounter: Payer: Self-pay | Admitting: Radiation Oncology

## 2022-04-04 ENCOUNTER — Ambulatory Visit
Admission: RE | Admit: 2022-04-04 | Discharge: 2022-04-04 | Disposition: A | Discharge status: Home / Self Care | Payer: No Typology Code available for payment source | Source: Ambulatory Visit | Attending: Radiation Oncology | Admitting: Radiation Oncology

## 2022-04-04 ENCOUNTER — Encounter: Payer: Self-pay | Admitting: *Deleted

## 2022-04-04 VITALS — BP 112/61 | HR 69 | Temp 97.6°F | Resp 14 | Ht 73.0 in | Wt 231.8 lb

## 2022-04-04 DIAGNOSIS — Z510 Encounter for antineoplastic radiation therapy: Secondary | ICD-10-CM | POA: Insufficient documentation

## 2022-04-04 DIAGNOSIS — F17210 Nicotine dependence, cigarettes, uncomplicated: Secondary | ICD-10-CM | POA: Diagnosis not present

## 2022-04-04 DIAGNOSIS — C3431 Malignant neoplasm of lower lobe, right bronchus or lung: Secondary | ICD-10-CM | POA: Insufficient documentation

## 2022-04-04 NOTE — Consult Note (Signed)
NEW PATIENT EVALUATION  Name: Gregory Lane  MRN: [id number]  Date:   04/04/2022     DOB: [birth year]   This 72 y.o. male patient presents to the clinic for initial evaluation of stage I non-small cell lung cancer of the right lower lobe favoring adenocarcinoma.  REFERRING PHYSICIAN: Cletis Athens, MD  CHIEF COMPLAINT:  Chief Complaint  Patient presents with   Lung Cancer    Consult    DIAGNOSIS: The encounter diagnosis was Adenocarcinoma of lower lobe of right lung ([place]).   PREVIOUS INVESTIGATIONS:  PET/CT CT scans reviewed Pathology reports reviewed Clinical notes reviewed  HPI: Patient is a 72 year old male who has been followed 5 progressive lesion in the right lower lobe.  CT scan showed spiculated solid 1.3 cm anterior right lower lobe pulmonary nodule increased over the past 4 months.  There is also 2 small satellite subpleural anterior right lower lobe solid pulmonary nodules measuring 0.4 cm.  PET CT scan confirmed hypermetabolic activity with no evidence of hypermetabolic thoracic adenopathy.  Did have some hypermetabolic bilateral upper cervical lymph nodes we will seek ENT evaluation in the future.  Patient underwent a fine-needle aspiration showing malignant cells consistent with adenocarcinoma.  Patient is seen today for evaluation.  He does have significant COPD emphysema.  He is wheelchair-bound and obese.  He specifically [place] cough hemoptysis or chest tightness.  PLANNED TREATMENT REGIMEN: SBRT  PAST MEDICAL HISTORY:  has a past medical history of Anxiety, Aortic atherosclerosis ([place]) (02/16/2017), Arthritis, Blind left eye, COPD (chronic obstructive pulmonary disease) ([place]), Depression, Dysrhythmia, GERD (gastroesophageal reflux disease), Hypertension, Low TSH level (02/17/2017), Myocardial infarction [facility]), PAD (peripheral artery disease) ([place]), Paroxysmal atrial fibrillation ([place]), Perforated duodenal ulcer ([place]), and Stroke ([place]).    PAST SURGICAL HISTORY:   Past Surgical History:  Procedure Laterality Date   BRONCHIAL BIOPSY  03/22/2022   Procedure: BRONCHIAL BIOPSIES;  Surgeon: Armando Reichert, MD;  Location: [place] [facility];  Service: Pulmonary;;   BRONCHIAL NEEDLE ASPIRATION BIOPSY  03/22/2022   Procedure: BRONCHIAL NEEDLE ASPIRATION BIOPSIES;  Surgeon: Armando Reichert, MD;  Location: [place] [facility];  Service: Pulmonary;;   ENDOBRONCHIAL ULTRASOUND  03/22/2022   Procedure: ENDOBRONCHIAL ULTRASOUND;  Surgeon: Armando Reichert, MD;  Location: [place] [facility];  Service: Pulmonary;;   EXPLORATORY LAPAROTOMY  05/12/2015   EYE SURGERY     FRACTURE SURGERY     LAPAROTOMY N/A 05/10/2015   Procedure: EXPLORATORY LAPAROTOMY;  Surgeon: Ralene Ok, MD;  Location: [place];  Service: General;  Laterality: N/A;   LEFT HEART CATH AND CORONARY ANGIOGRAPHY N/A 05/02/2016   Procedure: Left Heart Cath and Coronary Angiography;  Surgeon: Adrian Prows, MD;  Location: [place] CV LAB;  Service: Cardiovascular;  Laterality: N/A;   LOWER EXTREMITY ANGIOGRAPHY Left 09/21/2021   Procedure: Lower Extremity Angiography;  Surgeon: Katha Cabal, MD;  Location: [place] CV LAB;  Service: Cardiovascular;  Laterality: Left;    FAMILY HISTORY: family history includes Arrhythmia in his sister; Arthritis in his sister; Cancer in his father; Cirrhosis in his father; Dementia in his mother; Heart murmur in his sister; Hypertension in his mother and sister; Lung cancer in his sister.  SOCIAL HISTORY:  reports that he has been smoking cigarettes. He has a 32.50 pack-year smoking history. He has never used smokeless tobacco. He reports that he does not currently use alcohol after a past usage of about 60.0 standard drinks of alcohol per week. He reports that he does not use drugs.  ALLERGIES: Patient has no known allergies.  MEDICATIONS:  Current Outpatient Medications  Medication Sig Dispense Refill   atorvastatin (LIPITOR) 40 MG tablet Take 0.5 tablets (20 mg total) by mouth  daily. (Patient taking differently: Take 40 mg by mouth daily.) 90 tablet 3   enoxaparin (LOVENOX) 100 MG/ML injection Inject 1 mL (100 mg total) into the skin every 12 (twelve) hours. 20 mL 1   FLUoxetine (PROZAC) 20 MG capsule Take 2 capsules (40 mg total) by mouth daily. 60 capsule 1   pantoprazole (PROTONIX) 40 MG tablet Take 1 tablet (40 mg total) by mouth daily. 90 tablet 3   warfarin (COUMADIN) 4 MG tablet Take 1 tablet (4 mg total) by mouth daily. Take 1 daily Monday  through Friday. Do not take Saturday and Sunday.Take 4 mg by mouth daily. 60 tablet 4   No current facility-administered medications for this encounter.    ECOG PERFORMANCE STATUS:  0 - Asymptomatic  REVIEW OF SYSTEMS: Patient denies any weight loss, fatigue, weakness, fever, chills or night sweats. Patient denies any loss of vision, blurred vision. Patient denies any ringing  of the ears or hearing loss. No irregular heartbeat. Patient denies heart murmur or history of fainting. Patient denies any chest pain or pain radiating to her upper extremities. Patient denies any shortness of breath, difficulty breathing at night, cough or hemoptysis. Patient denies any swelling in the lower legs. Patient denies any nausea vomiting, vomiting of blood, or coffee ground material in the vomitus. Patient denies any stomach pain. Patient states has had normal bowel movements no significant constipation or diarrhea. Patient denies any dysuria, hematuria or significant nocturia. Patient denies any problems walking, swelling in the joints or loss of balance. Patient denies any skin changes, loss of hair or loss of weight. Patient denies any excessive worrying or anxiety or significant depression. Patient denies any problems with insomnia. Patient denies excessive thirst, polyuria, polydipsia. Patient denies any swollen glands, patient denies easy bruising or easy bleeding. Patient denies any recent infections, allergies or URI. Patient "s visual  fields have not changed significantly in recent time.   PHYSICAL EXAM: BP 112/61   Pulse 69   Temp 97.6 F (36.4 C)   Resp 14   Ht 6\' 1"  (1.854 m)   Wt 231 lb 12.8 oz (105.1 kg)   BMI 30.58 kg/m  Wheelchair-bound obese male in NAD.  Well-developed well-nourished patient in NAD. HEENT reveals PERLA, EOMI, discs not visualized.  Oral cavity is clear. No oral mucosal lesions are identified. Neck is clear without evidence of cervical or supraclavicular adenopathy. Lungs are clear to A&P. Cardiac examination is essentially unremarkable with regular rate and rhythm without murmur rub or thrill. Abdomen is benign with no organomegaly or masses noted. Motor sensory and DTR levels are equal and symmetric in the upper and lower extremities. Cranial nerves II through XII are grossly intact. Proprioception is intact. No peripheral adenopathy or edema is identified. No motor or sensory levels are noted. Crude visual fields are within normal range.  LABORATORY DATA: Pathology reports and cytology reports reviewed    RADIOLOGY RESULTS: CT scans and PET CT scan reviewed compatible with above-stated findings   IMPRESSION: Stage I adenocarcinoma of the right lower lobe and 72 year old male with significant COPD emphysema.  PLAN: At this time I recommended SBRT to his right lower lobe lesion.  Certainly not convinced of these other lesions in the periphery of his right lower lobe.  Would treat to [place] in 5 fractions with SBRT.  Risks and benefits  including minimal side effects from SBRT were reviewed.  He may have possibly of a cough a month out from clearing of his tumor.  I have personally set up and ordered CT simulation for next week.  Will use for dimensional treatment planning and motion restriction.  Patient comprehends my recommendation well.  I am also setting him up with a ENT evaluation based on his PET positive cervical nodes.  Patient comprehends my recommendations well.  I would like to take  this opportunity to thank you for allowing me to participate in the care of your patient.Noreene Filbert, MD

## 2022-04-04 NOTE — Progress Notes (Signed)
Met with patient during initial consult with Dr. Baruch Gouty. All questions answered during visit. Reviewed upcoming appts. Instructed to call with any questions or needs. Pt verbalized understanding.

## 2022-04-10 ENCOUNTER — Encounter: Payer: No Typology Code available for payment source | Admitting: *Deleted

## 2022-04-11 ENCOUNTER — Ambulatory Visit: Payer: Self-pay | Admitting: *Deleted

## 2022-04-11 ENCOUNTER — Ambulatory Visit (INDEPENDENT_AMBULATORY_CARE_PROVIDER_SITE_OTHER): Payer: No Typology Code available for payment source | Admitting: Cardiovascular Disease

## 2022-04-11 ENCOUNTER — Encounter: Payer: Self-pay | Admitting: Cardiovascular Disease

## 2022-04-11 VITALS — BP 120/72 | HR 64 | Ht 73.0 in | Wt 233.0 lb

## 2022-04-11 DIAGNOSIS — I4892 Unspecified atrial flutter: Secondary | ICD-10-CM

## 2022-04-11 DIAGNOSIS — I2510 Atherosclerotic heart disease of native coronary artery without angina pectoris: Secondary | ICD-10-CM

## 2022-04-11 DIAGNOSIS — I10 Essential (primary) hypertension: Secondary | ICD-10-CM | POA: Diagnosis not present

## 2022-04-11 DIAGNOSIS — I6340 Cerebral infarction due to embolism of unspecified cerebral artery: Secondary | ICD-10-CM | POA: Diagnosis not present

## 2022-04-11 DIAGNOSIS — K219 Gastro-esophageal reflux disease without esophagitis: Secondary | ICD-10-CM | POA: Diagnosis not present

## 2022-04-11 DIAGNOSIS — I4891 Unspecified atrial fibrillation: Secondary | ICD-10-CM

## 2022-04-11 DIAGNOSIS — R0789 Other chest pain: Secondary | ICD-10-CM

## 2022-04-11 DIAGNOSIS — I700 Atherosclerosis of aorta: Secondary | ICD-10-CM

## 2022-04-11 DIAGNOSIS — F17200 Nicotine dependence, unspecified, uncomplicated: Secondary | ICD-10-CM

## 2022-04-11 MED ORDER — PANTOPRAZOLE SODIUM 40 MG PO TBEC: 40.0000 mg | DELAYED_RELEASE_TABLET | Freq: Two times a day (BID) | ORAL | 1 refills | Status: DC

## 2022-04-11 MED ORDER — WARFARIN SODIUM 4 MG PO TABS: 4.0000 mg | ORAL_TABLET | Freq: Every day | ORAL | 4 refills | Status: DC

## 2022-04-11 NOTE — Patient Instructions (Addendum)
Start taking pantoprazole twice daily.

## 2022-04-11 NOTE — Assessment & Plan Note (Signed)
CCTA 11/2021 score 135, minor irregularities. Increase Protonix to twice daily.

## 2022-04-11 NOTE — Progress Notes (Signed)
Cardiology Office Note   Date:  04/11/2022   ID:  Gregory Lane, Gregory Lane April 10, 1950, MRN [id number]  PCP:  Cletis Athens, MD  Cardiologist:  Neoma Laming, MD      History of Present Illness: Gregory Lane is a 72 y.o. male who presents for  Chief Complaint  Patient presents with   Follow-up    1 month follow up    Patient in office for 1 month follow. Complains of increased shortness of breath, chest pain.   Chest Pain  This is a new problem. The current episode started in the past 7 days. The onset quality is gradual. The problem occurs every several days. The problem has been unchanged. The pain is present in the lateral region. The pain is at a severity of 4/10. The pain is mild. The pain does not radiate. Associated symptoms include shortness of breath. The pain is aggravated by nothing. He has tried acetaminophen for the symptoms. The treatment provided no relief. Risk factors include smoking/tobacco exposure, lack of exercise, male gender, obesity and being elderly.  His past medical history is significant for arrhythmia, CAD, cancer, COPD, CHF and hyperlipidemia.  Shortness of Breath This is a chronic problem. The current episode started in the past 7 days. The problem occurs intermittently. The problem has been unchanged. Associated symptoms include chest pain. His past medical history is significant for CAD.      Past Medical History:  Diagnosis Date   Anxiety    Aortic atherosclerosis ([place]) 02/16/2017   Arthritis    Blind left eye    COPD (chronic obstructive pulmonary disease) (HCC)    Depression    Dysrhythmia    A-fib   GERD (gastroesophageal reflux disease)    Hypertension    Low TSH level 02/17/2017   Myocardial infarction [facility])    PAD (peripheral artery disease) (H[facility])    Paroxysmal atrial fibrillation ([place])    Perforated duodenal ulcer ([place])    Stroke ([place])    left side is weak     Past Surgical History:  Procedure Laterality Date   BRONCHIAL  BIOPSY  03/22/2022   Procedure: BRONCHIAL BIOPSIES;  Surgeon: Armando Reichert, MD;  Location: [place] [facility]OSCOPY;  Service: Pulmonary;;   BRONCHIAL NEEDLE ASPIRATION BIOPSY  03/22/2022   Procedure: BRONCHIAL NEEDLE ASPIRATION BIOPSIES;  Surgeon: Armando Reichert, MD;  Location: [place] [facility]OSCOPY;  Service: Pulmonary;;   ENDOBRONCHIAL ULTRASOUND  03/22/2022   Procedure: ENDOBRONCHIAL ULTRASOUND;  Surgeon: Armando Reichert, MD;  Location: [place] ENDOSCOPY;  Service: Pulmonary;;   EXPLORATORY LAPAROTOMY  05/12/2015   EYE SURGERY     FRACTURE SURGERY     LAPAROTOMY N/A 05/10/2015   Procedure: EXPLORATORY LAPAROTOMY;  Surgeon: Ralene Ok, MD;  Location: [place];  Service: General;  Laterality: N/A;   LEFT HEART CATH AND CORONARY ANGIOGRAPHY N/A 05/02/2016   Procedure: Left Heart Cath and Coronary Angiography;  Surgeon: Adrian Prows, MD;  Location: [place] CV LAB;  Service: Cardiovascular;  Laterality: N/A;   LOWER EXTREMITY ANGIOGRAPHY Left 09/21/2021   Procedure: Lower Extremity Angiography;  Surgeon: Katha Cabal, MD;  Location: [place] CV LAB;  Service: Cardiovascular;  Laterality: Left;     Current Outpatient Medications  Medication Sig Dispense Refill   atorvastatin (LIPITOR) 40 MG tablet Take 0.5 tablets (20 mg total) by mouth daily. (Patient taking differently: Take 40 mg by mouth daily.) 90 tablet 3   Budeson-Glycopyrrol-Formoterol (BREZTRI AEROSPHERE) 160-9-4.8 MCG/ACT AERO Inhale 1 puff into the lungs as needed.  enoxaparin (LOVENOX) 100 MG/ML injection Inject 1 mL (100 mg total) into the skin every 12 (twelve) hours. 20 mL 1   FLUoxetine (PROZAC) 20 MG capsule Take 2 capsules (40 mg total) by mouth daily. 60 capsule 1   pantoprazole (PROTONIX) 40 MG tablet Take 1 tablet (40 mg total) by mouth 2 (two) times daily. 180 tablet 1   warfarin (COUMADIN) 4 MG tablet Take 1 tablet (4 mg total) by mouth daily. Take 1 daily Monday  through Friday. Do not take Saturday and Sunday.Take 4 mg by mouth  daily. 60 tablet 4   No current facility-administered medications for this visit.    Allergies:   Patient has no known allergies.    Social History:   reports that he has been smoking cigarettes. He has a 32.50 pack-year smoking history. He has never used smokeless tobacco. He reports that he does not currently use alcohol after a past usage of about 60.0 standard drinks of alcohol per week. He reports that he does not use drugs.   Family History:  family history includes Arrhythmia in his sister; Arthritis in his sister; Cancer in his father; Cirrhosis in his father; Dementia in his mother; Heart murmur in his sister; Hypertension in his mother and sister; Lung cancer in his sister.    ROS:     Review of Systems  Constitutional: Negative.   HENT: Negative.    Eyes: Negative.   Respiratory:  Positive for shortness of breath.   Cardiovascular:  Positive for chest pain.  Gastrointestinal: Negative.   Genitourinary: Negative.   Musculoskeletal: Negative.   Skin: Negative.   Neurological: Negative.   Endo/Heme/Allergies: Negative.   Psychiatric/Behavioral: Negative.    All other systems reviewed and are negative.    All other systems are reviewed and negative.    PHYSICAL EXAM: VS:  BP 120/72   Pulse 64   Ht 6\' 1"  (1.854 m)   Wt 233 lb (105.7 kg)   SpO2 97%   BMI 30.74 kg/m  , BMI Body mass index is 30.74 kg/m. Last weight:  Wt Readings from Last 3 Encounters:  04/11/22 233 lb (105.7 kg)  04/04/22 231 lb 12.8 oz (105.1 kg)  03/28/22 228 lb 14.4 oz (103.8 kg)     Physical Exam Vitals reviewed.  Constitutional:      Appearance: Normal appearance. He is normal weight.  HENT:     Head: Normocephalic.     Nose: Nose normal.     Mouth/Throat:     Mouth: Mucous membranes are moist.  Eyes:     Pupils: Pupils are equal, round, and reactive to light.  Cardiovascular:     Rate and Rhythm: Normal rate and regular rhythm.     Pulses: Normal pulses.     Heart sounds:  Normal heart sounds.  Pulmonary:     Effort: Pulmonary effort is normal.  Abdominal:     General: Abdomen is flat. Bowel sounds are normal.  Musculoskeletal:        General: Normal range of motion.     Cervical back: Normal range of motion.  Skin:    General: Skin is warm.  Neurological:     General: No focal deficit present.     Mental Status: He is alert.  Psychiatric:        Mood and Affect: Mood normal.      EKG:  none today  Recent Labs: 09/24/2021: Magnesium 2.0 03/22/2022: BUN 11; Creatinine, Ser 0.92; Hemoglobin 15.0; Platelets 184; Potassium 3.7;  Sodium 136    Lipid Panel    Component Value Date/Time   CHOL 235 (H) 12/19/2019 1114   TRIG 155 (H) 12/19/2019 1114   HDL 46 12/19/2019 1114   CHOLHDL 5.1 (H) 12/19/2019 1114   VLDL 17 05/24/2015 2320   LDLCALC 160 (H) 12/19/2019 1114      Other studies Reviewed: Patient: Norlina. Dockter DOB:  1950/10/22  Date:  12/06/2021 10:30 Provider: Neoma Laming MD Encounter: ALL ANGIOGRAMS (CTA BRAIN, CAROTIDS, RENAL ARTERIES, PE)   Page 1 REASON FOR VISIT  Referred by Dr.Immanuel Fedak Humphrey Rolls.     TESTS  Imaging: Computed Tomographic Angiography:  Cardiac multidetector CT was performed paying particular attention to the coronary arteries for the diagnosis of: CAD. Diagnostic Drugs:  Administered iohexol (Omnipaque) through an antecubital vein and images from the examination were analyzed for the presence and extent of coronary artery disease, using 3D image processing software. 100 mL of non-ionic contrast (Omnipaque) was used.     TEST CONCLUSIONS  Quality of study: Excellent  1-Calcium score: 135  2-Right dominant system.  3-Minor luminor irregularities.   Neoma Laming MD  Electronically signed by: Neoma Laming     Date: 12/07/2021 11:14      03/01/2022    2:50 PM  PAD Screen  Previous PAD dx? Yes  Previous surgical procedure? Yes  Dates of procedures 2014  Pain with walking? Yes  Subsides with  rest? No  Feet/toe relief with dangling? No  Painful, non-healing ulcers? No  Extremities discolored? Yes      ASSESSMENT AND PLAN:    ICD-10-CM   1. Chest pain, non-cardiac  R07.89     2. Atrial fibrillation, unspecified type (HCC)  I48.91 warfarin (COUMADIN) 4 MG tablet    3. GERD without esophagitis  K21.9 pantoprazole (PROTONIX) 40 MG tablet    4. Aortic atherosclerosis (HCC)  I70.0     5. Atrial flutter, unspecified type ([place])  I48.92     6. Cerebrovascular accident (CVA) due to embolism of cerebral artery ([place])  I63.40     7. Coronary artery disease involving native coronary artery of native heart without angina pectoris  I25.10     8. Essential hypertension  I10     9. Smoking  F17.200        Problem List Items Addressed This Visit       Cardiovascular and Mediastinum   Essential hypertension   Relevant Medications   warfarin (COUMADIN) 4 MG tablet   Aortic atherosclerosis (HCC)   Relevant Medications   warfarin (COUMADIN) 4 MG tablet   Cerebrovascular accident (CVA) due to embolism of cerebral artery (HCC)   Relevant Medications   warfarin (COUMADIN) 4 MG tablet   Atrial flutter (HCC)   Relevant Medications   warfarin (COUMADIN) 4 MG tablet   Coronary artery disease   Relevant Medications   warfarin (COUMADIN) 4 MG tablet     Digestive   GERD without esophagitis    CCTA 11/2021 score 135, minor irregularities. Increase Protonix to twice daily.       Relevant Medications   pantoprazole (PROTONIX) 40 MG tablet     Other   Smoking   Chest pain, non-cardiac - Primary   Other Visit Diagnoses     Atrial fibrillation, unspecified type ([place])       Relevant Medications   warfarin (COUMADIN) 4 MG tablet        Disposition:   Return in about 4 weeks (around 05/09/2022).  Total time spent: 30 minutes  Signed,  Neoma Laming, MD  04/11/2022 11:56 AM    [place]

## 2022-04-11 NOTE — Patient Outreach (Addendum)
  Care Coordination   Follow Up Visit Note   04/11/2022 Name: KEITHEN CAPO MRN: [id number] DOB: [birth year]  Alben Jepsen Nasby is a 72 y.o. year old male who sees Cletis Athens, MD for primary care. I spoke with  Dwan Bolt Fluitt by phone today.  What matters to the patients health and wellness today?  Meals on Wheels referral completed    Goals Addressed             This Visit's Progress    I would like meals on wheels       Care Coordination Interventions: Phone call to  [employer]-Kathy Truddie Crumble to confirm status of referral-VM left requesting a return call Lucianne Lei completed the assessment for [employer]als [employer] Whe[employer] on 03/14/22 Patient now followed by the cancer center-due to dx of lung cancer-patient acknowledged assessment by [place] Social Worker Cancer treatment to start next week Emotional support provided, patient encouraged to contact this Education officer, museum with any additional needs-patient also agreeable to follow up with the social worker from the cancer center if needed as well Addendum: return call from Meals on [place] worker Lincoln Maxin who states that patient is not eligible for Meals on Wheels as he is not ome bound, is able to to complete his own ADL's and IADL's and he has family that assists with his meals            SDOH assessments and interventions completed:  No     Care Coordination Interventions:  Yes, provided  Interventions Today    Flowsheet Row Most Recent Value  Chronic Disease   Chronic disease during today's visit Chronic Obstructive Pulmonary Disease (COPD), Hypertension (HTN), Other  [Lung Cancer]  General Interventions   General Interventions Discussed/Reviewed General Interventions Discussed, Community Resources       Follow up plan: No further intervention required.   Encounter Outcome:  Pt. Visit Completed

## 2022-04-11 NOTE — Patient Instructions (Addendum)
Visit Information  Thank you for taking time to visit with me today. Please don't hesitate to contact me if I can be of assistance to you.   Following are the goals we discussed today:   Goals Addressed             This Visit's Progress    I would like meals on wheels       Care Coordination Interventions: Phone call to  [employer]-Kathy Truddie Crumble to confirm status of referral-VM left requesting a return call Lucianne Lei completed the assessment for [employer] on 03/14/22 Patient now followed by the cancer center-due to dx of lung cancer-patient acknowledged assessment by [place] Worker Cancer treatment to start next week Emotional support provided, patient encouraged to contact this Education officer, museum with any additional needs-patient also agreeable to follow up with the social worker from the cancer center if needed as well Addendum: return call from Meals on [place] worker Lincoln Maxin who states that patient is not eligible for Meals on Wheels as he is not ome bound, is able to to complete his own ADL's and IADL's and he has family that assists with his meals             Please call the care guide team at [phone] if you need to cancel or reschedule your appointment.   If you are experiencing a Mental Health or [place] or need someone to talk to, please call [phone]   Patient verbalizes understanding of instructions and care plan provided today and agrees to view in [place]. Active MyChart status and patient understanding of how to access instructions and care plan via MyChart confirmed with patient.     No further follow up required: Patient to contact this Education officer, museum with any additional community resource needs  [employer], [place] Worker  [facility] Care Management [phone]

## 2022-04-12 ENCOUNTER — Encounter: Payer: Self-pay | Admitting: *Deleted

## 2022-04-12 ENCOUNTER — Ambulatory Visit
Admission: RE | Admit: 2022-04-12 | Discharge: 2022-04-12 | Disposition: A | Discharge status: Home / Self Care | Payer: No Typology Code available for payment source | Source: Ambulatory Visit | Attending: Radiation Oncology | Admitting: Radiation Oncology

## 2022-04-12 ENCOUNTER — Ambulatory Visit: Payer: No Typology Code available for payment source | Attending: Internal Medicine

## 2022-04-12 DIAGNOSIS — Z510 Encounter for antineoplastic radiation therapy: Secondary | ICD-10-CM | POA: Diagnosis not present

## 2022-04-12 DIAGNOSIS — C3431 Malignant neoplasm of lower lobe, right bronchus or lung: Secondary | ICD-10-CM

## 2022-04-13 DIAGNOSIS — Z510 Encounter for antineoplastic radiation therapy: Secondary | ICD-10-CM | POA: Diagnosis not present

## 2022-04-13 DIAGNOSIS — C3492 Malignant neoplasm of unspecified part of left bronchus or lung: Secondary | ICD-10-CM | POA: Diagnosis not present

## 2022-04-14 ENCOUNTER — Encounter (HOSPITAL_COMMUNITY): Payer: Self-pay

## 2022-04-17 ENCOUNTER — Ambulatory Visit: Payer: No Typology Code available for payment source | Admitting: Internal Medicine

## 2022-04-25 ENCOUNTER — Other Ambulatory Visit: Payer: Self-pay

## 2022-04-25 ENCOUNTER — Ambulatory Visit
Admission: RE | Admit: 2022-04-25 | Discharge: 2022-04-25 | Disposition: A | Discharge status: Home / Self Care | Payer: No Typology Code available for payment source | Source: Ambulatory Visit | Attending: Radiation Oncology | Admitting: Radiation Oncology

## 2022-04-25 ENCOUNTER — Encounter: Payer: Self-pay | Admitting: *Deleted

## 2022-04-25 DIAGNOSIS — Z510 Encounter for antineoplastic radiation therapy: Secondary | ICD-10-CM | POA: Insufficient documentation

## 2022-04-25 DIAGNOSIS — F17210 Nicotine dependence, cigarettes, uncomplicated: Secondary | ICD-10-CM | POA: Insufficient documentation

## 2022-04-25 DIAGNOSIS — C3431 Malignant neoplasm of lower lobe, right bronchus or lung: Secondary | ICD-10-CM | POA: Diagnosis not present

## 2022-04-25 LAB — RAD ONC ARIA SESSION SUMMARY
Course Elapsed Days: 0
Plan Fractions Treated to Date: 1
Plan Prescribed Dose Per Fraction: 12 Gy
Plan Total Fractions Prescribed: 5
Plan Total Prescribed Dose: 60 Gy
Reference Point Dosage Given to Date: 12 Gy
Reference Point Session Dosage Given: 12 Gy
Session Number: 1

## 2022-04-27 ENCOUNTER — Other Ambulatory Visit: Payer: Self-pay

## 2022-04-27 ENCOUNTER — Ambulatory Visit
Admission: RE | Admit: 2022-04-27 | Discharge: 2022-04-27 | Disposition: A | Discharge status: Home / Self Care | Payer: No Typology Code available for payment source | Source: Ambulatory Visit | Attending: Radiation Oncology | Admitting: Radiation Oncology

## 2022-04-27 DIAGNOSIS — Z510 Encounter for antineoplastic radiation therapy: Secondary | ICD-10-CM | POA: Diagnosis not present

## 2022-04-27 LAB — RAD ONC ARIA SESSION SUMMARY
Course Elapsed Days: 2
Plan Fractions Treated to Date: 2
Plan Prescribed Dose Per Fraction: 12 Gy
Plan Total Fractions Prescribed: 5
Plan Total Prescribed Dose: 60 Gy
Reference Point Dosage Given to Date: 24 Gy
Reference Point Session Dosage Given: 12 Gy
Session Number: 2

## 2022-05-01 ENCOUNTER — Other Ambulatory Visit: Payer: Self-pay

## 2022-05-01 ENCOUNTER — Ambulatory Visit
Admission: RE | Admit: 2022-05-01 | Discharge: 2022-05-01 | Disposition: A | Discharge status: Home / Self Care | Payer: No Typology Code available for payment source | Source: Ambulatory Visit | Attending: Radiation Oncology | Admitting: Radiation Oncology

## 2022-05-01 DIAGNOSIS — Z510 Encounter for antineoplastic radiation therapy: Secondary | ICD-10-CM | POA: Diagnosis not present

## 2022-05-01 LAB — RAD ONC ARIA SESSION SUMMARY
Course Elapsed Days: 6
Plan Fractions Treated to Date: 3
Plan Prescribed Dose Per Fraction: 12 Gy
Plan Total Fractions Prescribed: 5
Plan Total Prescribed Dose: 60 Gy
Reference Point Dosage Given to Date: 36 Gy
Reference Point Session Dosage Given: 12 Gy
Session Number: 3

## 2022-05-03 ENCOUNTER — Other Ambulatory Visit: Payer: Self-pay

## 2022-05-03 ENCOUNTER — Ambulatory Visit
Admission: RE | Admit: 2022-05-03 | Discharge: 2022-05-03 | Disposition: A | Discharge status: Home / Self Care | Payer: No Typology Code available for payment source | Source: Ambulatory Visit | Attending: Radiation Oncology | Admitting: Radiation Oncology

## 2022-05-03 DIAGNOSIS — Z510 Encounter for antineoplastic radiation therapy: Secondary | ICD-10-CM | POA: Diagnosis not present

## 2022-05-03 LAB — RAD ONC ARIA SESSION SUMMARY
Course Elapsed Days: 8
Plan Fractions Treated to Date: 4
Plan Prescribed Dose Per Fraction: 12 Gy
Plan Total Fractions Prescribed: 5
Plan Total Prescribed Dose: 60 Gy
Reference Point Dosage Given to Date: 48 Gy
Reference Point Session Dosage Given: 12 Gy
Session Number: 4

## 2022-05-08 ENCOUNTER — Ambulatory Visit: Payer: No Typology Code available for payment source

## 2022-05-09 ENCOUNTER — Encounter: Payer: Self-pay | Admitting: Cardiovascular Disease

## 2022-05-09 ENCOUNTER — Ambulatory Visit: Payer: No Typology Code available for payment source | Admitting: Cardiovascular Disease

## 2022-05-09 VITALS — BP 132/60 | HR 80 | Ht 73.0 in | Wt 228.8 lb

## 2022-05-09 DIAGNOSIS — I10 Essential (primary) hypertension: Secondary | ICD-10-CM | POA: Diagnosis not present

## 2022-05-09 DIAGNOSIS — F17200 Nicotine dependence, unspecified, uncomplicated: Secondary | ICD-10-CM | POA: Diagnosis not present

## 2022-05-09 DIAGNOSIS — I2510 Atherosclerotic heart disease of native coronary artery without angina pectoris: Secondary | ICD-10-CM

## 2022-05-09 DIAGNOSIS — E782 Mixed hyperlipidemia: Secondary | ICD-10-CM | POA: Diagnosis not present

## 2022-05-09 DIAGNOSIS — I4892 Unspecified atrial flutter: Secondary | ICD-10-CM

## 2022-05-09 NOTE — Assessment & Plan Note (Signed)
CCTA 11/2021  Quality of study: Excellent  1-Calcium score: 135  2-Right dominant system.  3-Minor luminor irregularities.

## 2022-05-09 NOTE — Assessment & Plan Note (Signed)
Controlled today. Continue same medications.

## 2022-05-09 NOTE — Progress Notes (Signed)
Cardiology Office Note   Date:  05/09/2022   ID:  Gregory Lane, DOB May 26, 1950, MRN [id number]  PCP:  Cletis Athens, MD  Cardiologist:  Neoma Laming, MD      History of Present Illness: Gregory Lane is a 72 y.o. male who presents for  Chief Complaint  Patient presents with   Follow-up    4 week follow up    Patient in office for one month follow up.     Past Medical History:  Diagnosis Date   Anxiety    Aortic atherosclerosis ([place]) 02/16/2017   Arthritis    Blind left eye    COPD (chronic obstructive pulmonary disease) (HCC)    Depression    Dysrhythmia    A-fib   GERD (gastroesophageal reflux disease)    Hypertension    Low TSH level 02/17/2017   Myocardial infarction [facility])    PAD (peripheral artery disease) (HCC)    Paroxysmal atrial fibrillation ([place])    Perforated duodenal ulcer ([place])    Stroke ([place])    left side is weak     Past Surgical History:  Procedure Laterality Date   BRONCHIAL BIOPSY  03/22/2022   Procedure: BRONCHIAL BIOPSIES;  Surgeon: Armando Reichert, MD;  Location: [facility] [facility];  Service: Pulmonary;;   BRONCHIAL NEEDLE ASPIRATION BIOPSY  03/22/2022   Procedure: BRONCHIAL NEEDLE ASPIRATION BIOPSIES;  Surgeon: Armando Reichert, MD;  Location: [place] [facility];  Service: Pulmonary;;   ENDOBRONCHIAL ULTRASOUND  03/22/2022   Procedure: ENDOBRONCHIAL ULTRASOUND;  Surgeon: Armando Reichert, MD;  Location: [place] [facility];  Service: Pulmonary;;   EXPLORATORY LAPAROTOMY  05/12/2015   EYE SURGERY     FRACTURE SURGERY     LAPAROTOMY N/A 05/10/2015   Procedure: EXPLORATORY LAPAROTOMY;  Surgeon: Ralene Ok, MD;  Location: [place];  Service: General;  Laterality: N/A;   LEFT HEART CATH AND CORONARY ANGIOGRAPHY N/A 05/02/2016   Procedure: Left Heart Cath and Coronary Angiography;  Surgeon: Adrian Prows, MD;  Location: [place] [facility];  Service: Cardiovascular;  Laterality: N/A;   LOWER EXTREMITY ANGIOGRAPHY Left 09/21/2021   Procedure: Lower Extremity  Angiography;  Surgeon: Katha Cabal, MD;  Location: [place] [facility];  Service: Cardiovascular;  Laterality: Left;     Current Outpatient Medications  Medication Sig Dispense Refill   atorvastatin (LIPITOR) 40 MG tablet Take 0.5 tablets (20 mg total) by mouth daily. (Patient taking differently: Take 40 mg by mouth daily.) 90 tablet 3   Budeson-Glycopyrrol-Formoterol (BREZTRI AEROSPHERE) 160-9-4.8 MCG/ACT AERO Inhale 1 puff into the lungs as needed.     enoxaparin (LOVENOX) 100 MG/ML injection Inject 1 mL (100 mg total) into the skin every 12 (twelve) hours. 20 mL 1   furosemide (LASIX) 20 MG tablet Take 20 mg by mouth daily.     KLOR-CON M20 20 MEQ tablet Take 20 mEq by mouth 2 (two) times daily.     pantoprazole (PROTONIX) 40 MG tablet Take 1 tablet (40 mg total) by mouth 2 (two) times daily. 180 tablet 1   warfarin (COUMADIN) 4 MG tablet Take 1 tablet (4 mg total) by mouth daily. Take 1 daily Monday  through Friday. Do not take Saturday and Sunday.Take 4 mg by mouth daily. 60 tablet 4   FLUoxetine (PROZAC) 20 MG capsule Take 2 capsules (40 mg total) by mouth daily. (Patient not taking: Reported on 05/09/2022) 60 capsule 1   No current facility-administered medications for this visit.    Allergies:   Patient has no known allergies.  Social History:   reports that he has been smoking cigarettes. He has a 32.50 pack-year smoking history. He has never used smokeless tobacco. He reports that he does not currently use alcohol after a past usage of about 60.0 standard drinks of alcohol per week. He reports that he does not use drugs.   Family History:  family history includes Arrhythmia in his sister; Arthritis in his sister; Cancer in his father; Cirrhosis in his father; Dementia in his mother; Heart murmur in his sister; Hypertension in his mother and sister; Lung cancer in his sister.    ROS:     Review of Systems  Constitutional: Negative.   HENT: Negative.    Eyes: Negative.    Respiratory: Negative.    Cardiovascular: Negative.   Gastrointestinal: Negative.   Genitourinary: Negative.   Musculoskeletal: Negative.   Skin: Negative.   Neurological: Negative.   Endo/Heme/Allergies: Negative.   Psychiatric/Behavioral: Negative.    All other systems reviewed and are negative.   All other systems are reviewed and negative.   PHYSICAL EXAM: VS:  BP 132/60   Pulse 80   Ht 6\' 1"  (1.854 m)   Wt 228 lb 12.8 oz (103.8 kg)   SpO2 95%   BMI 30.19 kg/m  , BMI Body mass index is 30.19 kg/m. Last weight:  Wt Readings from Last 3 Encounters:  05/09/22 228 lb 12.8 oz (103.8 kg)  04/11/22 233 lb (105.7 kg)  04/04/22 231 lb 12.8 oz (105.1 kg)   Physical Exam Vitals reviewed.  Constitutional:      Appearance: Normal appearance. He is normal weight.  HENT:     Head: Normocephalic.     Nose: Nose normal.     Mouth/Throat:     Mouth: Mucous membranes are moist.  Eyes:     Pupils: Pupils are equal, round, and reactive to light.  Cardiovascular:     Rate and Rhythm: Normal rate and regular rhythm.     Pulses: Normal pulses.     Heart sounds: Normal heart sounds.  Pulmonary:     Effort: Pulmonary effort is normal.  Abdominal:     General: Abdomen is flat. Bowel sounds are normal.  Musculoskeletal:        General: Normal range of motion.     Cervical back: Normal range of motion.  Skin:    General: Skin is warm.  Neurological:     General: No focal deficit present.     Mental Status: He is alert.  Psychiatric:        Mood and Affect: Mood normal.     EKG: none today  Recent Labs: 09/24/2021: Magnesium 2.0 03/22/2022: BUN 11; Creatinine, Ser 0.92; Hemoglobin 15.0; Platelets 184; Potassium 3.7; Sodium 136    Lipid Panel    Component Value Date/Time   CHOL 235 (H) 12/19/2019 1114   TRIG 155 (H) 12/19/2019 1114   HDL 46 12/19/2019 1114   CHOLHDL 5.1 (H) 12/19/2019 1114   VLDL 17 05/24/2015 2320   LDLCALC 160 (H) 12/19/2019 1114     ASSESSMENT AND  PLAN:    ICD-10-CM   1. Atrial flutter, unspecified type ([place])  I48.92     2. Coronary artery disease involving native coronary artery of native heart without angina pectoris  I25.10     3. Essential hypertension  I10     4. Mixed hyperlipidemia  E78.2     5. Smoking  F17.200        Problem List Items Addressed This Visit  Cardiovascular and Mediastinum   Essential hypertension    Controlled today. Continue same medications.      Relevant Medications   furosemide (LASIX) 20 MG tablet   Atrial flutter (HCC) - Primary   Relevant Medications   furosemide (LASIX) 20 MG tablet   Coronary artery disease    CCTA 11/2021  Quality of study: Excellent  1-Calcium score: 135  2-Right dominant system.  3-Minor luminor irregularities.       Relevant Medications   furosemide (LASIX) 20 MG tablet     Other   Smoking   Hyperlipidemia   Relevant Medications   furosemide (LASIX) 20 MG tablet     Disposition:   Return in about 3 months (around 08/09/2022).    Total time spent: 30 minutes  Signed,  Neoma Laming, MD  05/09/2022 1:24 [place]

## 2022-05-10 ENCOUNTER — Other Ambulatory Visit: Payer: Self-pay

## 2022-05-10 ENCOUNTER — Ambulatory Visit
Admission: RE | Admit: 2022-05-10 | Discharge: 2022-05-10 | Disposition: A | Discharge status: Home / Self Care | Payer: No Typology Code available for payment source | Source: Ambulatory Visit | Attending: Radiation Oncology | Admitting: Radiation Oncology

## 2022-05-10 DIAGNOSIS — F17210 Nicotine dependence, cigarettes, uncomplicated: Secondary | ICD-10-CM | POA: Diagnosis not present

## 2022-05-10 DIAGNOSIS — C3431 Malignant neoplasm of lower lobe, right bronchus or lung: Secondary | ICD-10-CM | POA: Diagnosis not present

## 2022-05-10 DIAGNOSIS — Z510 Encounter for antineoplastic radiation therapy: Secondary | ICD-10-CM | POA: Diagnosis not present

## 2022-05-10 LAB — RAD ONC ARIA SESSION SUMMARY
Course Elapsed Days: 15
Plan Fractions Treated to Date: 5
Plan Prescribed Dose Per Fraction: 12 Gy
Plan Total Fractions Prescribed: 5
Plan Total Prescribed Dose: 60 Gy
Reference Point Dosage Given to Date: 60 Gy
Reference Point Session Dosage Given: 12 Gy
Session Number: 5

## 2022-05-15 ENCOUNTER — Other Ambulatory Visit: Payer: Self-pay | Admitting: *Deleted

## 2022-05-21 ENCOUNTER — Other Ambulatory Visit: Payer: Self-pay | Admitting: Cardiovascular Disease

## 2022-06-08 ENCOUNTER — Other Ambulatory Visit: Payer: Self-pay

## 2022-06-08 ENCOUNTER — Ambulatory Visit: Payer: No Typology Code available for payment source | Admitting: Internal Medicine

## 2022-06-08 ENCOUNTER — Emergency Department
Admission: EM | Admit: 2022-06-08 | Discharge: 2022-06-08 | Disposition: A | Discharge status: Home / Self Care | Payer: No Typology Code available for payment source | Attending: Emergency Medicine | Admitting: Emergency Medicine

## 2022-06-08 DIAGNOSIS — R0902 Hypoxemia: Secondary | ICD-10-CM | POA: Diagnosis not present

## 2022-06-08 DIAGNOSIS — R531 Weakness: Secondary | ICD-10-CM | POA: Diagnosis not present

## 2022-06-08 DIAGNOSIS — I959 Hypotension, unspecified: Secondary | ICD-10-CM | POA: Diagnosis not present

## 2022-06-08 DIAGNOSIS — F10929 Alcohol use, unspecified with intoxication, unspecified: Secondary | ICD-10-CM | POA: Diagnosis not present

## 2022-06-08 LAB — BASIC METABOLIC PANEL
Anion gap: 11 (ref 5–15)
BUN: 15 mg/dL (ref 8–23)
CO2: 17 mmol/L — ABNORMAL LOW (ref 22–32)
Calcium: 8 mg/dL — ABNORMAL LOW (ref 8.9–10.3)
Chloride: 110 mmol/L (ref 98–111)
Creatinine, Ser: 0.99 mg/dL (ref 0.61–1.24)
GFR, Estimated: >60 mL/min (ref >60)
Glucose, Bld: 86 mg/dL (ref 70–99)
Potassium: 4.4 mmol/L (ref 3.5–5.1)
Sodium: 138 mmol/L (ref 135–145)

## 2022-06-08 LAB — URINALYSIS, ROUTINE W REFLEX MICROSCOPIC
Bilirubin Urine: NEGATIVE
Glucose, UA: NEGATIVE mg/dL
Hgb urine dipstick: NEGATIVE
Ketones, ur: NEGATIVE mg/dL
Nitrite: NEGATIVE
Protein, ur: NEGATIVE mg/dL
Specific Gravity, Urine: 1.009 (ref 1.005–1.030)
Squamous Epithelial / HPF: NONE SEEN /HPF (ref 0–5)
pH: 5 (ref 5.0–8.0)

## 2022-06-08 LAB — CBC
HCT: 42.4 % (ref 39.0–52.0)
Hemoglobin: 14.1 g/dL (ref 13.0–17.0)
MCH: 34.1 pg — ABNORMAL HIGH (ref 26.0–34.0)
MCHC: 33.3 g/dL (ref 30.0–36.0)
MCV: 102.7 fL — ABNORMAL HIGH (ref 80.0–100.0)
Platelets: 183 10*3/uL (ref 150–400)
RBC: 4.13 MIL/uL — ABNORMAL LOW (ref 4.22–5.81)
RDW: 14.2 % (ref 11.5–15.5)
WBC: 4.4 10*3/uL (ref 4.0–10.5)
nRBC: 0 % (ref 0.0–0.2)

## 2022-06-08 LAB — TROPONIN I (HIGH SENSITIVITY): Troponin I (High Sensitivity): 5 ng/L (ref <18)

## 2022-06-08 MED ORDER — SODIUM CHLORIDE 0.9 % IV BOLUS
1000.0000 mL | Freq: Once | INTRAVENOUS | Status: AC
  Administered 2022-06-08: 1000 mL via INTRAVENOUS

## 2022-06-08 NOTE — ED Provider Notes (Signed)
[facility] Provider Note    Event Date/Time   First MD Initiated Contact with Patient 06/08/22 1503     (approximate)   History   Weakness   HPI  Gregory Lane is a 72 y.o. male who presents to the emergency department today because of concerns for weakness.  He states that his knees went out from under him.  He did fall on the ground.  He was then unable to get up.  He has had his knees do this to him in the past and it says it has happened a number of times and it has been going on for a while.  However he typically is able to get up on his own.  He does have a walker at home although does not use it when he is walking around his house.  Patient denies any injuries from the fall.  Denies any chest pain or palpitations.     Physical Exam   Triage Vital Signs: ED Triage Vitals  Enc Vitals Group     BP 06/08/22 1326 103/68     Pulse Rate 06/08/22 1325 73     Resp 06/08/22 1325 20     Temp 06/08/22 1325 98.2 F (36.8 C)     Temp src --      SpO2 06/08/22 1326 94 %     Weight 06/08/22 1325 230 lb (104.3 kg)     Height 06/08/22 1325 6\' 1"  (1.854 m)     Head Circumference --      Peak Flow --      Pain Score 06/08/22 1325 4     Pain Loc --      Pain Edu? --      Excl. in GC? --     Most recent vital signs: Vitals:   06/08/22 1325 06/08/22 1326  BP:  103/68  Pulse: 73   Resp: 20   Temp: 98.2 F (36.8 C)   SpO2:  94%   General: Awake, alert, oriented. CV:  Good peripheral perfusion. Regular rate and rhythm. Resp:  Normal effort. Lungs clear. Abd:  No distention.  Other:  Bilateral knees without erythema or warmth. Non tender.   ED Results / Procedures / Treatments   Labs (all labs ordered are listed, but only abnormal results are displayed) Labs Reviewed  BASIC METABOLIC PANEL - Abnormal; Notable for the following components:      Result Value   CO2 17 (*)    Calcium 8.0 (*)    All other components within normal limits  CBC -  Abnormal; Notable for the following components:   RBC 4.13 (*)    MCV 102.7 (*)    MCH 34.1 (*)    All other components within normal limits  URINALYSIS, ROUTINE W REFLEX MICROSCOPIC - Abnormal; Notable for the following components:   Color, Urine YELLOW (*)    APPearance CLEAR (*)    Leukocytes,Ua TRACE (*)    Bacteria, UA RARE (*)    All other components within normal limits  TROPONIN I (HIGH SENSITIVITY)     EKG  I, Phineas Semen, attending physician, personally viewed and interpreted this EKG  EKG Time: 1329 Rate: 71 Rhythm: normal sinus rhythm Axis: normal Intervals: qtc 473 QRS: narrow ST changes: no st elevation Impression: normal ekg   RADIOLOGY None   PROCEDURES:  Critical Care performed: No   MEDICATIONS ORDERED IN ED: Medications - No data to display   IMPRESSION / MDM /  ASSESSMENT AND PLAN / ED COURSE  I reviewed the triage vital signs and the nursing notes.                              Differential diagnosis includes, but is not limited to, anemia, dehydration, electrolyte abnormality.  Patient's presentation is most consistent with acute presentation with potential threat to life or bodily function.   The patient is on the cardiac monitor to evaluate for evidence of arrhythmia and/or significant heart rate changes.  Patient presented to the emergency department today because of concerns for weakness.  Patient did have a fall but denies any traumatic injuries.  No obvious findings for traumatic injuries on physical exam.  Patient's blood work without concerning troponin elevation or electrolyte abnormality or anemia.  EKG without concerning arrhythmia.  Patient was given IV fluids here in the emergency department and was able to ambulate and felt better.  At this time somewhat unclear etiology of the patient's occasional weakness of the knees.  However given that it has been going on for a while did not think any further emergent workup is  necessary.  Given the patient feels better do wonder if he was somewhat dehydrated.      FINAL CLINICAL IMPRESSION(S) / ED DIAGNOSES   Final diagnoses:  Weakness     Note:  This document was prepared using Dragon voice recognition software and may include unintentional dictation errors.    Phineas Semen, MD 06/08/22 1800

## 2022-06-08 NOTE — Discharge Instructions (Signed)
Please seek medical attention for any high fevers, chest pain, shortness of breath, change in behavior, persistent vomiting, bloody stool or any other new or concerning symptoms.  

## 2022-06-08 NOTE — ED Triage Notes (Signed)
Pt to ED for bilateral leg weakness for a couple months. States today is having difficulty walking. Also c/o pain in bilateral legs  20G to left forearm PTA, NS PTA Daily drinker, last drink 4 hours ago

## 2022-06-08 NOTE — ED Notes (Signed)
On assessment pt's skin bilateral groin severely macerated. Skin is moist, red, open, and sloughing. Pt was wet with urine on assessment. Pt states that he is wet often. Area cleaned, dried, and barrier cream applied. Pt educated on using barrier cream and tube of cream put with pts belongings to take home.

## 2022-06-08 NOTE — ED Notes (Signed)
Pt oob in hall with standby assist. Pt states that he is better and he wants to go home. Edp aware.

## 2022-06-08 NOTE — Progress Notes (Deleted)
Follow-up Outpatient Visit Date: 06/08/2022  Primary Care Provider: Corky Downs, MD [address] [place] [address]  Chief Complaint: ***  HPI:  Gregory Lane is a 72 y.o. male with history of paroxysmal atrial fibrillation, questionable MI, stroke, PAD (suspected cardioembolic event to left lower extremity in 09/2021), hypertension, hyperlipidemia, and COPD, who presents for follow-up of shortness of breath and atrial fibrillation.  I met him in January for preoperative cardiovascular risk assessment in anticipation of a lung nodule biopsy.  He complained of chronic exertional dyspnea felt to be due to to his underlying lung disease.  Echocardiogram obtained today earlier showed preserved LVEF with grade 1 diastolic dysfunction and mild to moderate mitral regurgitation.  I recommended proceeding with his lung nodule biopsy.  He subsequently saw Dr. Welton Flakes [facility] cardiology) for follow-up of CAD and atrial flutter.  No medication changes or additional testing was recommended.  --------------------------------------------------------------------------------------------------  Past Medical History:  Diagnosis Date   Anxiety    Aortic atherosclerosis (HCC) 02/16/2017   Arthritis    Blind left eye    COPD (chronic obstructive pulmonary disease) (HCC)    Depression    Dysrhythmia    A-fib   GERD (gastroesophageal reflux disease)    Hypertension    Low TSH level 02/17/2017   Myocardial infarction [facility])    PAD (peripheral artery disease) (HCC)    Paroxysmal atrial fibrillation (HCC)    Perforated duodenal ulcer (HCC)    Stroke (HCC)    left side is weak   Past Surgical History:  Procedure Laterality Date   BRONCHIAL BIOPSY  03/22/2022   Procedure: BRONCHIAL BIOPSIES;  Surgeon: Raechel Chute, MD;  Location: [facility];  Service: Pulmonary;;   BRONCHIAL NEEDLE ASPIRATION BIOPSY  03/22/2022   Procedure: BRONCHIAL NEEDLE ASPIRATION BIOPSIES;  Surgeon: Raechel Chute, MD;  Location:  [facility];  Service: Pulmonary;;   ENDOBRONCHIAL ULTRASOUND  03/22/2022   Procedure: ENDOBRONCHIAL ULTRASOUND;  Surgeon: Raechel Chute, MD;  Location: [facility];  Service: Pulmonary;;   EXPLORATORY LAPAROTOMY  05/12/2015   EYE SURGERY     FRACTURE SURGERY     LAPAROTOMY N/A 05/10/2015   Procedure: EXPLORATORY LAPAROTOMY;  Surgeon: Axel Filler, MD;  Location: [facility] OR;  Service: General;  Laterality: N/A;   LEFT HEART CATH AND CORONARY ANGIOGRAPHY N/A 05/02/2016   Procedure: Left Heart Cath and Coronary Angiography;  Surgeon: Yates Decamp, MD;  Location[facility];  Service: Cardiovascular;  Laterality: N/A;   LOWER EXTREMITY ANGIOGRAPHY Left 09/21/2021   Procedure: Lower Extremity Angiography;  Surgeon: Renford Dills, MD;  Location: [facility] INVASIVE CV LAB;  Service: Cardiovascular;  Laterality: Left;    No outpatient medications have been marked as taking for the 06/08/22 encounter (Appointment) with Saiya Crist, Cristal Deer, MD.    Allergies: Patient has no known allergies.  Social History   Tobacco Use   Smoking status: Every Day    Packs/day: 0.50    Years: 65.00    Additional pack years: 0.00    Total pack years: 32.50    Types: Cigarettes   Smokeless tobacco: Never   Tobacco comments:    0.5 PPD  Vaping Use   Vaping Use: Never used  Substance Use Topics   Alcohol use: Not Currently    Alcohol/week: 60.0 standard drinks of alcohol    Types: 60 Standard drinks or equivalent per week    Comment: daily 12 pk of beer   Drug use: No    Family History  Problem Relation Age of  Onset   Hypertension Mother    Dementia Mother    Cirrhosis Father    Cancer Father    Lung cancer Sister    Hypertension Sister    Arthritis Sister    Heart murmur Sister    Arrhythmia Sister     Review of Systems: A 12-system review of systems was performed and was negative except as noted in the  HPI.  --------------------------------------------------------------------------------------------------  Physical Exam: There were no vitals taken for this visit.  General:  NAD. Neck: No JVD or HJR. Lungs: Clear to auscultation bilaterally without wheezes or crackles. Heart: Regular rate and rhythm without murmurs, rubs, or gallops. Abdomen: Soft, nontender, nondistended. Extremities: No lower extremity edema.  EKG:  ***  Lab Results  Component Value Date   WBC 5.7 03/22/2022   HGB 15.0 03/22/2022   HCT 43.7 03/22/2022   MCV 103.1 (H) 03/22/2022   PLT 184 03/22/2022    Lab Results  Component Value Date   NA 136 03/22/2022   K 3.7 03/22/2022   CL 104 03/22/2022   CO2 23 03/22/2022   BUN 11 03/22/2022   CREATININE 0.92 03/22/2022   GLUCOSE 111 (H) 03/22/2022   ALT 20 12/19/2019    Lab Results  Component Value Date   CHOL 235 (H) 12/19/2019   HDL 46 12/19/2019   LDLCALC 160 (H) 12/19/2019   TRIG 155 (H) 12/19/2019   CHOLHDL 5.1 (H) 12/19/2019    --------------------------------------------------------------------------------------------------  ASSESSMENT AND PLAN: Yvonne Kendall, MD 06/08/2022 7:46 AM

## 2022-06-21 ENCOUNTER — Ambulatory Visit
Admission: RE | Admit: 2022-06-21 | Discharge: 2022-06-21 | Disposition: A | Discharge status: Home / Self Care | Payer: No Typology Code available for payment source | Source: Ambulatory Visit | Attending: Radiation Oncology | Admitting: Radiation Oncology

## 2022-06-21 ENCOUNTER — Encounter: Payer: Self-pay | Admitting: Radiation Oncology

## 2022-06-21 VITALS — BP 98/59 | HR 65 | Temp 98.3°F | Resp 16

## 2022-06-21 DIAGNOSIS — C3431 Malignant neoplasm of lower lobe, right bronchus or lung: Secondary | ICD-10-CM | POA: Insufficient documentation

## 2022-06-21 NOTE — Progress Notes (Signed)
Radiation Oncology Follow up Note  Name: Gregory Lane   Date:   06/21/2022 MRN:  [id number] DOB: 1951-01-25    This 72 y.o. male presents to the clinic today for 1 month follow-up status post SBRT for stage I non-small cell lung cancer of the right lower lobe.  REFERRING PROVIDER: Corky Downs, MD  HPI: Patient is a 72 year old male now out 1 month having completed SBRT to his right lower lobe for a presumed stage I non-small cell lung cancer favoring adenocarcinoma seen today in routine follow-up he states he has had no change in his pulmonary status specifically denies significant cough hemoptysis or chest tightness.  He is having pain in his knees other all overall multiple somatic complaints..  COMPLICATIONS OF TREATMENT: none  FOLLOW UP COMPLIANCE: keeps appointments   PHYSICAL EXAM:  BP (!) 98/59 (BP Location: Left Arm, Patient Position: Sitting, Cuff Size: Normal)   Pulse 65   Temp 98.3 F (36.8 C) (Tympanic)   Resp 16  Slightly obese wheelchair-bound male in NAD.  Well-developed well-nourished patient in NAD. HEENT reveals PERLA, EOMI, discs not visualized.  Oral cavity is clear. No oral mucosal lesions are identified. Neck is clear without evidence of cervical or supraclavicular adenopathy. Lungs are clear to A&P. Cardiac examination is essentially unremarkable with regular rate and rhythm without murmur rub or thrill. Abdomen is benign with no organomegaly or masses noted. Motor sensory and DTR levels are equal and symmetric in the upper and lower extremities. Cranial nerves II through XII are grossly intact. Proprioception is intact. No peripheral adenopathy or edema is identified. No motor or sensory levels are noted. Crude visual fields are within normal range.  RADIOLOGY RESULTS: PET scan in 3 months ordered  PLAN: This time of asked to see him back in 3 months with a PET scan of his chest prior to that visit.  PET scan was already ordered I will see him back after that.   I have asked him to contact his GP for all his other somatic complaints beyond the scope of this practice.  Patient is to call with any concerns.  I would like to take this opportunity to thank you for allowing me to participate in the care of your patient.Carmina Miller, MD

## 2022-06-30 ENCOUNTER — Other Ambulatory Visit: Payer: Self-pay | Admitting: Cardiovascular Disease

## 2022-06-30 DIAGNOSIS — I2510 Atherosclerotic heart disease of native coronary artery without angina pectoris: Secondary | ICD-10-CM

## 2022-07-04 ENCOUNTER — Telehealth: Payer: Self-pay | Admitting: *Deleted

## 2022-07-04 NOTE — Telephone Encounter (Signed)
Pt overdue to have INR checked. Called pt and LMOM to call office back.

## 2022-08-03 ENCOUNTER — Ambulatory Visit (INDEPENDENT_AMBULATORY_CARE_PROVIDER_SITE_OTHER): Payer: No Typology Code available for payment source | Admitting: Vascular Surgery

## 2022-08-03 ENCOUNTER — Ambulatory Visit (INDEPENDENT_AMBULATORY_CARE_PROVIDER_SITE_OTHER): Payer: No Typology Code available for payment source

## 2022-08-03 ENCOUNTER — Encounter (INDEPENDENT_AMBULATORY_CARE_PROVIDER_SITE_OTHER): Payer: Self-pay | Admitting: Vascular Surgery

## 2022-08-03 VITALS — BP 117/72 | HR 63 | Resp 16

## 2022-08-03 DIAGNOSIS — I739 Peripheral vascular disease, unspecified: Secondary | ICD-10-CM

## 2022-08-03 DIAGNOSIS — J449 Chronic obstructive pulmonary disease, unspecified: Secondary | ICD-10-CM | POA: Diagnosis not present

## 2022-08-03 DIAGNOSIS — E785 Hyperlipidemia, unspecified: Secondary | ICD-10-CM

## 2022-08-03 DIAGNOSIS — I10 Essential (primary) hypertension: Secondary | ICD-10-CM | POA: Diagnosis not present

## 2022-08-03 DIAGNOSIS — I2510 Atherosclerotic heart disease of native coronary artery without angina pectoris: Secondary | ICD-10-CM

## 2022-08-04 LAB — VAS US ABI WITH/WO TBI
Left ABI: 0.99
Right ABI: 0.99

## 2022-08-06 ENCOUNTER — Encounter (INDEPENDENT_AMBULATORY_CARE_PROVIDER_SITE_OTHER): Payer: Self-pay | Admitting: Vascular Surgery

## 2022-08-06 NOTE — Progress Notes (Signed)
MRN : [id number]  Gregory Lane is a 72 y.o. ([birth year]) male who presents with chief complaint of check circulation.  History of Present Illness:   The patient returns to the office for followup and review status post angiogram with intervention on 09/21/2021.    Procedure: Mechanical thrombectomy left SFA, profunda and SFA with the Penumbra catheter   The patient notes improvement in the lower extremity symptoms. No interval shortening of the patient's claudication distance or rest pain symptoms. No new ulcers or wounds have occurred since the last visit.   There have been no significant changes to the patient's overall health care.   No documented history of amaurosis fugax or recent TIA symptoms. There are no recent neurological changes noted. No documented history of DVT, PE or superficial thrombophlebitis. The patient denies recent episodes of angina or shortness of breath.    ABI's Rt=1.12 and Lt=1.15  (previous ABI's Rt=1.00 and Lt=0.96) Previus duplex US of the left lower extremity arterial system shows patent SFA with uniform flow  Current Meds  Medication Sig   atorvastatin (LIPITOR) 40 MG tablet TAKE 1 TABLET BY MOUTH EVERY DAY   Budeson-Glycopyrrol-Formoterol (BREZTRI AEROSPHERE) 160-9-4.8 MCG/ACT AERO Inhale 1 puff into the lungs as needed.   FLUoxetine (PROZAC) 20 MG capsule Take 2 capsules (40 mg total) by mouth daily.   furosemide (LASIX) 20 MG tablet Take 20 mg by mouth daily.   KLOR-CON M20 20 MEQ tablet Take 20 mEq by mouth 2 (two) times daily.   pantoprazole (PROTONIX) 40 MG tablet Take 1 tablet (40 mg total) by mouth 2 (two) times daily.   warfarin (COUMADIN) 4 MG tablet Take 1 tablet (4 mg total) by mouth daily. Take 1 daily Monday  through Friday. Do not take Saturday and Sunday.Take 4 mg by mouth daily.    Past Medical History:  Diagnosis Date   Anxiety    Aortic atherosclerosis  (HCC) 02/16/2017   Arthritis    Blind left eye    COPD (chronic obstructive pulmonary disease) (HCC)    Depression    Dysrhythmia    A-fib   GERD (gastroesophageal reflux disease)    Hypertension    Low TSH level 02/17/2017   Myocardial infarction [facility])    PAD (peripheral artery disease) (HCC)    Paroxysmal atrial fibrillation (HCC)    Perforated duodenal ulcer (HCC)    Stroke (HCC)    left side is weak    Past Surgical History:  Procedure Laterality Date   BRONCHIAL BIOPSY  03/22/2022   Procedure: BRONCHIAL BIOPSIES;  Surgeon: Raechel Chute, MD;  [facility];  Service: Pulmonary;;   BRONCHIAL NEEDLE ASPIRATION BIOPSY  03/22/2022   Procedure: BRONCHIAL NEEDLE ASPIRATION BIOPSIES;  Surgeon: Raechel Chute, MD;  Location: [facility];  Service: Pulmonary;;   ENDOBRONCHIAL ULTRASOUND  03/22/2022   Procedure: ENDOBRONCHIAL ULTRASOUND;  Surgeon: Raechel Chute, MD;  Location: [facility];  Service: Pulmonary;;   EXPLORATORY LAPAROTOMY  05/12/2015   EYE SURGERY     FRACTURE SURGERY     LAPAROTOMY N/A 05/10/2015   Procedure: EXPLORATORY LAPAROTOMY;  Surgeon: Axel Filler,  MD;  Location: [facility];  Service: General;  Laterality: N/A;   LEFT HEART CATH AND CORONARY ANGIOGRAPHY N/A 05/02/2016   Procedure: Left Heart Cath and Coronary Angiography;  Surgeon: Yates Decamp, MD;  Location[facility] INVASIVE CV LAB;  Service: Cardiovascular;  Laterality: N/A;   LOWER EXTREMITY ANGIOGRAPHY Left 09/21/2021   Procedure: Lower Extremity Angiography;  Surgeon: Renford Dills, MD;  Location: [facility] INVASIVE CV LAB;  Service: Cardiovascular;  Laterality: Left;    Social History Social History   Tobacco Use   Smoking status: Every Day    Packs/day: 0.50    Years: 65.00    Additional pack years: 0.00    Total pack years: 32.50    Types: Cigarettes   Smokeless tobacco: Never   Tobacco comments:    0.5 PPD  Vaping Use   Vaping Use: Never used  Substance Use Topics   Alcohol use: Yes     Alcohol/week: 60.0 standard drinks of alcohol    Types: 60 Standard drinks or equivalent per week    Comment: daily 12 pk of beer   Drug use: No    Family History Family History  Problem Relation Age of Onset   Hypertension Mother    Dementia Mother    Cirrhosis Father    Cancer Father    Lung cancer Sister    Hypertension Sister    Arthritis Sister    Heart murmur Sister    Arrhythmia Sister     No Known Allergies   REVIEW OF SYSTEMS (Negative unless checked)  Constitutional: [] Weight loss  [] Fever  [] Chills Cardiac: [] Chest pain   [] Chest pressure   [] Palpitations   [] Shortness of breath when laying flat   [] Shortness of breath with exertion. Vascular:  [x] Pain in legs with walking   [] Pain in legs at rest  [] History of DVT   [] Phlebitis   [] Swelling in legs   [] Varicose veins   [] Non-healing ulcers Pulmonary:   [] Uses home oxygen   [] Productive cough   [] Hemoptysis   [] Wheeze  [x] COPD   [] Asthma Neurologic:  [] Dizziness   [] Seizures   [] History of stroke   [] History of TIA  [] Aphasia   [] Vissual changes   [] Weakness or numbness in arm   [] Weakness or numbness in leg Musculoskeletal:   [] Joint swelling   [x] Joint pain   [x] Low back pain Hematologic:  [] Easy bruising  [] Easy bleeding   [] Hypercoagulable state   [] Anemic Gastrointestinal:  [] Diarrhea   [] Vomiting  [x] Gastroesophageal reflux/heartburn   [] Difficulty swallowing. Genitourinary:  [] Chronic kidney disease   [] Difficult urination  [] Frequent urination   [] Blood in urine Skin:  [] Rashes   [] Ulcers  Psychological:  [] History of anxiety   []  History of major depression.  Physical Examination  Vitals:   08/03/22 1412  BP: 117/72  Pulse: 63  Resp: 16   There is no height or weight on file to calculate BMI. Gen: WD/WN, NAD Head: /AT, No temporalis wasting.  Ear/Nose/Throat: Hearing grossly intact, nares w/o erythema or drainage Eyes: PER, EOMI, sclera nonicteric.  Neck: Supple, no masses.  No bruit or JVD.   Pulmonary:  Good air movement, no audible wheezing, no use of accessory muscles.  Cardiac: RRR, normal S1, S2, no Murmurs. Vascular:  mild trophic changes, no open wounds Vessel Right Left  Radial Palpable Palpable  PT  Palpable Palpable  DP Not Palpable Not Palpable  Gastrointestinal: soft, non-distended. No guarding/no peritoneal signs.  Musculoskeletal: M/S 5/5 throughout.  No visible deformity.  Neurologic: CN 2-12  intact. Pain and light touch intact in extremities.  Symmetrical.  Speech is fluent. Motor exam as listed above. Psychiatric: Judgment intact, Mood & affect appropriate for pt's clinical situation. Dermatologic: No rashes or ulcers noted.  No changes consistent with cellulitis.   CBC Lab Results  Component Value Date   WBC 4.4 06/08/2022   HGB 14.1 06/08/2022   HCT 42.4 06/08/2022   MCV 102.7 (H) 06/08/2022   PLT 183 06/08/2022    BMET    Component Value Date/Time   NA 138 06/08/2022 1327   K 4.4 06/08/2022 1327   CL 110 06/08/2022 1327   CO2 17 (L) 06/08/2022 1327   GLUCOSE 86 06/08/2022 1327   BUN 15 06/08/2022 1327   CREATININE 0.99 06/08/2022 1327   CREATININE 0.91 12/19/2019 1114   CALCIUM 8.0 (L) 06/08/2022 1327   GFRNONAA >60 06/08/2022 1327   GFRNONAA 84 12/19/2019 1114   GFRAA 98 12/19/2019 1114   CrCl cannot be calculated (Patient's most recent lab result is older than the maximum 21 days allowed.).  COAG Lab Results  Component Value Date   INR 1.2 (A) 03/29/2022   INR 1.0 03/22/2022   INR 2.2 03/08/2022    Radiology VAS [place] ABI WITH/WO TBI  Result Date: 08/04/2022  LOWER EXTREMITY DOPPLER STUDY Patient Name:  JERRI VANFOSSEN  Date of Exam:   08/03/2022 Medical Rec #: [id number]         Accession #:    [id number] Date of Birth: 1950/03/18         Patient Gender: M Patient Age:   68 years Exam Location:  [facility] Procedure:      VAS [place] ABI WITH/WO TBI Referring Phys: Levora Dredge  --------------------------------------------------------------------------------  Indications: Peripheral artery disease. High Risk Factors: Hypertension, prior CVA.  Vascular Interventions: 09/21/2021 Mechanical thrombectomy left SFA with the                         penumbra CAT 7 device Mechanical thrombectomy of the                         left profunda femoris with the penumbra CAT 7 device                         Mechanical thrombectomy of the distal left common                         femoral artery with the penumbra CAT 7 device. Comparison Study: 02/09/2022 Performing Technologist: Debbe Bales RVS  Examination Guidelines: A complete evaluation includes at minimum, Doppler waveform signals and systolic blood pressure reading at the level of bilateral brachial, anterior tibial, and posterior tibial arteries, when vessel segments are accessible. Bilateral testing is considered an integral part of a complete examination. Photoelectric Plethysmograph (PPG) waveforms and toe systolic pressure readings are included as required and additional duplex testing as needed. Limited examinations for reoccurring indications may be performed as noted.  ABI Findings: +---------+------------------+-----+---------+--------+ Right    Rt Pressure (mmHg)IndexWaveform Comment  +---------+------------------+-----+---------+--------+ Brachial 137                                      +---------+------------------+-----+---------+--------+ ATA      124  0.91 biphasic          +---------+------------------+-----+---------+--------+ PTA      136               0.99 triphasic         +---------+------------------+-----+---------+--------+ Great Toe153               1.12 Normal            +---------+------------------+-----+---------+--------+ +---------+------------------+-----+---------+-------+ Left     Lt Pressure (mmHg)IndexWaveform Comment  +---------+------------------+-----+---------+-------+ Brachial 130                                     +---------+------------------+-----+---------+-------+ ATA      129               0.94 triphasic        +---------+------------------+-----+---------+-------+ PTA      135               0.99 triphasic        +---------+------------------+-----+---------+-------+ Great Toe136               0.99 Normal           +---------+------------------+-----+---------+-------+ +-------+-----------+-----------+------------+------------+ ABI/TBIToday's ABIToday's TBIPrevious ABIPrevious TBI +-------+-----------+-----------+------------+------------+ Right  .99        1.12       1.12        1.00         +-------+-----------+-----------+------------+------------+ Left   .99        .99        1.15        1.01         +-------+-----------+-----------+------------+------------+ Bilateral TBIs appear essentially unchanged compared to prior study on 02/09/2022. Bilateral ABIs appear essentially unchanged compared to prior study on 02/09/2022.  Summary: Right: Resting right ankle-brachial index is within normal range. The right toe-brachial index is normal. Left: Resting left ankle-brachial index is within normal range. The left toe-brachial index is normal. *See table(s) above for measurements and observations.   Electronically signed by Levora Dredge MD on 08/04/2022 at 9:55:52 AM.    Final      Assessment/Plan 1. PAD (peripheral artery disease) (HCC)  Recommend:  The patient has evidence of atherosclerosis of the lower extremities with claudication.  The patient does not voice lifestyle limiting changes at this point in time.  Noninvasive studies do not suggest clinically significant change.  No invasive studies, angiography or surgery at this time The patient should continue walking and begin a more formal exercise program.  The patient should continue antiplatelet therapy and  aggressive treatment of the lipid abnormalities  No changes in the patient's medications at this time  Continued surveillance is indicated as atherosclerosis is likely to progress with time.    The patient will continue follow up with noninvasive studies as ordered.  - VAS [place] ABI WITH/WO TBI; Future  2. Essential hypertension Continue antihypertensive medications as already ordered, these medications have been reviewed and there are no changes at this time.  3. Coronary artery disease involving native coronary artery of native heart without angina pectoris Continue cardiac and antihypertensive medications as already ordered and reviewed, no changes at this time.  Continue statin as ordered and reviewed, no changes at this time  Nitrates PRN for chest pain  4. Chronic obstructive pulmonary disease, unspecified COPD type (HCC) Continue pulmonary medications and aerosols as already ordered, these medications have been reviewed and  there are no changes at this time.   5. Hyperlipidemia, unspecified hyperlipidemia type Continue statin as ordered and reviewed, no changes at this time    Levora Dredge, MD  08/06/2022 3:11 PM

## 2022-08-08 ENCOUNTER — Ambulatory Visit (INDEPENDENT_AMBULATORY_CARE_PROVIDER_SITE_OTHER): Payer: No Typology Code available for payment source | Admitting: Cardiovascular Disease

## 2022-08-08 ENCOUNTER — Encounter: Payer: Self-pay | Admitting: Cardiovascular Disease

## 2022-08-08 VITALS — BP 150/75 | HR 66 | Ht 73.0 in | Wt 231.4 lb

## 2022-08-08 DIAGNOSIS — I739 Peripheral vascular disease, unspecified: Secondary | ICD-10-CM

## 2022-08-08 DIAGNOSIS — I2510 Atherosclerotic heart disease of native coronary artery without angina pectoris: Secondary | ICD-10-CM | POA: Diagnosis not present

## 2022-08-08 DIAGNOSIS — I700 Atherosclerosis of aorta: Secondary | ICD-10-CM | POA: Diagnosis not present

## 2022-08-08 DIAGNOSIS — I10 Essential (primary) hypertension: Secondary | ICD-10-CM | POA: Diagnosis not present

## 2022-08-08 DIAGNOSIS — I4892 Unspecified atrial flutter: Secondary | ICD-10-CM

## 2022-08-08 MED ORDER — LOSARTAN POTASSIUM 50 MG PO TABS: 50.0000 mg | ORAL_TABLET | Freq: Two times a day (BID) | ORAL | 11 refills | Status: DC

## 2022-08-08 NOTE — Progress Notes (Signed)
Cardiology Office Note   Date:  08/08/2022   ID:  Gregory Lane, DOB Apr 20, 1950, MRN [id number]  PCP:  Corky Downs, MD  Cardiologist:  Adrian Blackwater, MD      History of Present Illness: Gregory Lane is a 72 y.o. male who presents for  Chief Complaint  Patient presents with   Follow-up    3 MONTHS FOLLOW UP    Patient is feeling fatigued and occasional shortness of breath.  Patient wants to have primary care set up as was recommended by his vascular doctor Dr. Gilda Crease.  Will set up with Dr. Kinnie Feil      Past Medical History:  Diagnosis Date   Anxiety    Aortic atherosclerosis (HCC) 02/16/2017   Arthritis    Blind left eye    COPD (chronic obstructive pulmonary disease) (HCC)    Depression    Dysrhythmia    A-fib   GERD (gastroesophageal reflux disease)    Hypertension    Low TSH level 02/17/2017   Myocardial infarction [facility])    PAD (peripheral artery disease) (HCC)    Paroxysmal atrial fibrillation (HCC)    Perforated duodenal ulcer (HCC)    Stroke (HCC)    left side is weak     Past Surgical History:  Procedure Laterality Date   BRONCHIAL BIOPSY  03/22/2022   Procedure: BRONCHIAL BIOPSIES;  Surgeon: Raechel Chute, MD;  [facility];  Service: Pulmonary;;   BRONCHIAL NEEDLE ASPIRATION BIOPSY  03/22/2022   Procedure: BRONCHIAL NEEDLE ASPIRATION BIOPSIES;  Surgeon: Raechel Chute, MD;  Location: [facility];  Service: Pulmonary;;   ENDOBRONCHIAL ULTRASOUND  03/22/2022   Procedure: ENDOBRONCHIAL ULTRASOUND;  Surgeon: Raechel Chute, MD;  Location: [facility];  Service: Pulmonary;;   EXPLORATORY LAPAROTOMY  05/12/2015   EYE SURGERY     FRACTURE SURGERY     LAPAROTOMY N/A 05/10/2015   Procedure: EXPLORATORY LAPAROTOMY;  Surgeon: Axel Filler, MD;  Location: [facility] OR;  Service: General;  Laterality: N/A;   LEFT HEART CATH AND CORONARY ANGIOGRAPHY N/A 05/02/2016   Procedure: Left Heart Cath and Coronary Angiography;  Surgeon: Yates Decamp, MD;   Location: [facility] INVASIVE CV LAB;  Service: Cardiovascular;  Laterality: N/A;   LOWER EXTREMITY ANGIOGRAPHY Left 09/21/2021   Procedure: Lower Extremity Angiography;  Surgeon: Renford Dills, MD;  Location: [facility] INVASIVE CV LAB;  Service: Cardiovascular;  Laterality: Left;     Current Outpatient Medications  Medication Sig Dispense Refill   losartan (COZAAR) 50 MG tablet Take 1 tablet (50 mg total) by mouth 2 (two) times daily. 60 tablet 11   atorvastatin (LIPITOR) 40 MG tablet TAKE 1 TABLET BY MOUTH EVERY DAY 90 tablet 3   Budeson-Glycopyrrol-Formoterol (BREZTRI AEROSPHERE) 160-9-4.8 MCG/ACT AERO Inhale 1 puff into the lungs as needed.     enoxaparin (LOVENOX) 100 MG/ML injection Inject 1 mL (100 mg total) into the skin every 12 (twelve) hours. (Patient not taking: Reported on 08/03/2022) 20 mL 1   FLUoxetine (PROZAC) 20 MG capsule Take 2 capsules (40 mg total) by mouth daily. 60 capsule 1   furosemide (LASIX) 20 MG tablet Take 20 mg by mouth daily.     KLOR-CON M20 20 MEQ tablet Take 20 mEq by mouth 2 (two) times daily.     pantoprazole (PROTONIX) 40 MG tablet Take 1 tablet (40 mg total) by mouth 2 (two) times daily. 180 tablet 1   warfarin (COUMADIN) 4 MG tablet Take 1 tablet (4 mg total) by mouth daily. Take 1 daily  Monday  through Friday. Do not take Saturday and Sunday.Take 4 mg by mouth daily. 60 tablet 4   No current facility-administered medications for this visit.    Allergies:   Patient has no known allergies.    Social History:   reports that he has been smoking cigarettes. He has a 32.50 pack-year smoking history. He has never used smokeless tobacco. He reports current alcohol use of about 60.0 standard drinks of alcohol per week. He reports that he does not use drugs.   Family History:  family history includes Arrhythmia in his sister; Arthritis in his sister; Cancer in his father; Cirrhosis in his father; Dementia in his mother; Heart murmur in his sister; Hypertension in his  mother and sister; Lung cancer in his sister.    ROS:     Review of Systems  Constitutional: Negative.   HENT: Negative.    Eyes: Negative.   Respiratory: Negative.    Gastrointestinal: Negative.   Genitourinary: Negative.   Musculoskeletal: Negative.   Skin: Negative.   Neurological: Negative.   Endo/Heme/Allergies: Negative.   Psychiatric/Behavioral: Negative.    All other systems reviewed and are negative.     All other systems are reviewed and negative.    PHYSICAL EXAM: VS:  BP (!) 150/75   Pulse 66   Ht 6\' 1"  (1.854 m)   Wt 231 lb 6.4 oz (105 kg)   SpO2 96%   BMI 30.53 kg/m  , BMI Body mass index is 30.53 kg/m. Last weight:  Wt Readings from Last 3 Encounters:  08/08/22 231 lb 6.4 oz (105 kg)  06/08/22 230 lb (104.3 kg)  05/09/22 228 lb 12.8 oz (103.8 kg)     Physical Exam Vitals reviewed.  Constitutional:      Appearance: Normal appearance. He is normal weight.  HENT:     Head: Normocephalic.     Nose: Nose normal.     Mouth/Throat:     Mouth: Mucous membranes are moist.  Eyes:     Pupils: Pupils are equal, round, and reactive to light.  Cardiovascular:     Rate and Rhythm: Normal rate and regular rhythm.     Pulses: Normal pulses.     Heart sounds: Normal heart sounds.  Pulmonary:     Effort: Pulmonary effort is normal.  Abdominal:     General: Abdomen is flat. Bowel sounds are normal.  Musculoskeletal:        General: Normal range of motion.     Cervical back: Normal range of motion.  Skin:    General: Skin is warm.  Neurological:     General: No focal deficit present.     Mental Status: He is alert.  Psychiatric:        Mood and Affect: Mood normal.       EKG:   Recent Labs: 09/24/2021: Magnesium 2.0 06/08/2022: BUN 15; Creatinine, Ser 0.99; Hemoglobin 14.1; Platelets 183; Potassium 4.4; Sodium 138    Lipid Panel    Component Value Date/Time   CHOL 235 (H) 12/19/2019 1114   TRIG 155 (H) 12/19/2019 1114   HDL 46 12/19/2019  1114   CHOLHDL 5.1 (H) 12/19/2019 1114   VLDL 17 05/24/2015 2320   LDLCALC 160 (H) 12/19/2019 1114      Other studies Reviewed: Additional studies/ records that were reviewed today include:  Review of the above records demonstrates:      03/01/2022    2:50 PM  PAD Screen  Previous PAD dx? Yes  Previous  surgical procedure? Yes  Dates of procedures 2014  Pain with walking? Yes  Subsides with rest? No  Feet/toe relief with dangling? No  Painful, non-healing ulcers? No  Extremities discolored? Yes      ASSESSMENT AND PLAN:    ICD-10-CM   1. Atrial flutter, unspecified type (HCC)  I48.92 losartan (COZAAR) 50 MG tablet   Occasionally feels palpitation    2. Aortic atherosclerosis (HCC)  I70.0 losartan (COZAAR) 50 MG tablet    3. Coronary artery disease involving native coronary artery of native heart without angina pectoris  I25.10 losartan (COZAAR) 50 MG tablet   Denies any chest pain or shortness of breath    4. Essential hypertension  I10 losartan (COZAAR) 50 MG tablet   Stable.    5. PAD (peripheral artery disease) (HCC)  I73.9 losartan (COZAAR) 50 MG tablet       Problem List Items Addressed This Visit       Cardiovascular and Mediastinum   Essential hypertension   Relevant Medications   losartan (COZAAR) 50 MG tablet   Aortic atherosclerosis (HCC)   Relevant Medications   losartan (COZAAR) 50 MG tablet   PAD (peripheral artery disease) (HCC)   Relevant Medications   losartan (COZAAR) 50 MG tablet   Atrial flutter (HCC) - Primary   Relevant Medications   losartan (COZAAR) 50 MG tablet   Coronary artery disease   Relevant Medications   losartan (COZAAR) 50 MG tablet       Disposition:   Return in about 4 weeks (around 09/05/2022) for Also set up to see Dr Kinnie Feil as primary.    Total time spent: 30 minutes  Signed,  Adrian Blackwater, MD  08/08/2022 2:27 PM    [facility]

## 2022-08-11 ENCOUNTER — Telehealth: Payer: Self-pay | Admitting: *Deleted

## 2022-08-11 NOTE — Telephone Encounter (Signed)
Received message from [facility] that pt wishes to cancel his PET scan scheduled for Mon 6/24. Pt did not want to reschedule at this time. Pt has upcoming follow up appts with Dr. Rushie Chestnut and Dr. Orlie Dakin. Clinical team made aware.

## 2022-08-14 ENCOUNTER — Ambulatory Visit: Payer: No Typology Code available for payment source

## 2022-08-16 ENCOUNTER — Ambulatory Visit
Admission: RE | Admit: 2022-08-16 | Discharge: 2022-08-16 | Disposition: A | Discharge status: Home / Self Care | Payer: No Typology Code available for payment source | Source: Ambulatory Visit | Attending: Oncology | Admitting: Oncology

## 2022-08-16 DIAGNOSIS — C3431 Malignant neoplasm of lower lobe, right bronchus or lung: Secondary | ICD-10-CM | POA: Insufficient documentation

## 2022-08-16 DIAGNOSIS — I714 Abdominal aortic aneurysm, without rupture, unspecified: Secondary | ICD-10-CM | POA: Insufficient documentation

## 2022-08-16 DIAGNOSIS — I700 Atherosclerosis of aorta: Secondary | ICD-10-CM | POA: Insufficient documentation

## 2022-08-16 LAB — GLUCOSE, CAPILLARY: Glucose-Capillary: 94 mg/dL (ref 70–99)

## 2022-08-16 MED ORDER — FLUDEOXYGLUCOSE F - 18 (FDG) INJECTION: 7.5000 | Freq: Once | INTRAVENOUS | Status: DC

## 2022-08-16 MED ORDER — FLUDEOXYGLUCOSE F - 18 (FDG) INJECTION
12.0000 | Freq: Once | INTRAVENOUS | Status: AC | PRN
  Administered 2022-08-16: 12.86 via INTRAVENOUS

## 2022-08-17 ENCOUNTER — Telehealth: Payer: No Typology Code available for payment source | Admitting: Oncology

## 2022-08-21 ENCOUNTER — Ambulatory Visit: Payer: No Typology Code available for payment source | Admitting: Radiation Oncology

## 2022-08-21 ENCOUNTER — Ambulatory Visit (INDEPENDENT_AMBULATORY_CARE_PROVIDER_SITE_OTHER): Payer: No Typology Code available for payment source | Admitting: Internal Medicine

## 2022-08-21 VITALS — BP 138/82 | HR 75 | Ht 73.0 in | Wt 229.8 lb

## 2022-08-21 DIAGNOSIS — B356 Tinea cruris: Secondary | ICD-10-CM

## 2022-08-21 DIAGNOSIS — J301 Allergic rhinitis due to pollen: Secondary | ICD-10-CM

## 2022-08-21 DIAGNOSIS — J449 Chronic obstructive pulmonary disease, unspecified: Secondary | ICD-10-CM | POA: Diagnosis not present

## 2022-08-21 DIAGNOSIS — E782 Mixed hyperlipidemia: Secondary | ICD-10-CM

## 2022-08-21 MED ORDER — CLOTRIMAZOLE-BETAMETHASONE 1-0.05 % EX CREA
1.0000 | TOPICAL_CREAM | Freq: Two times a day (BID) | CUTANEOUS | 0 refills | Status: DC
Start: 2022-08-21 — End: 2022-12-11

## 2022-08-21 MED ORDER — CETIRIZINE HCL 10 MG PO TABS
10.0000 mg | ORAL_TABLET | Freq: Every day | ORAL | 1 refills | Status: DC
Start: 2022-08-21 — End: 2022-12-11

## 2022-08-21 MED ORDER — FLUTICASONE PROPIONATE 50 MCG/ACT NA SUSP
1.0000 | Freq: Every day | NASAL | 1 refills | Status: DC
Start: 2022-08-21 — End: 2022-12-11

## 2022-08-21 MED ORDER — BREZTRI AEROSPHERE 160-9-4.8 MCG/ACT IN AERO
1.0000 | INHALATION_SPRAY | RESPIRATORY_TRACT | 2 refills | Status: DC | PRN
Start: 2022-08-21 — End: 2022-12-11

## 2022-08-21 NOTE — Progress Notes (Signed)
Established Patient Office Visit  Subjective:  Patient ID: Gregory Lane, male    DOB: March 02, 1950  Age: 72 y.o. MRN: [id number]  Chief Complaint  Patient presents with   Establish Care    New patient     Here to establish IM care. C/o "irritation" between his legs and in his perineum. C/o moisture in that area, itchy with white bumps. C/o postnasal drip and cough worse in evening productive of yellow sputum.    No other concerns at this time.   Past Medical History:  Diagnosis Date   Anxiety    Aortic atherosclerosis (HCC) 02/16/2017   Arthritis    Blind left eye    COPD (chronic obstructive pulmonary disease) (HCC)    Depression    Dysrhythmia    A-fib   GERD (gastroesophageal reflux disease)    Hypertension    Low TSH level 02/17/2017   Myocardial infarction [facility])    PAD (peripheral artery disease) (HCC)    Paroxysmal atrial fibrillation (HCC)    Perforated duodenal ulcer (HCC)    Stroke (HCC)    left side is weak    Past Surgical History:  Procedure Laterality Date   BRONCHIAL BIOPSY  03/22/2022   Procedure: BRONCHIAL BIOPSIES;  Surgeon: Raechel Chute, MD;  Location: [facility] ENDOSCOPY;  Service: Pulmonary;;   BRONCHIAL NEEDLE ASPIRATION BIOPSY  03/22/2022   Procedure: BRONCHIAL NEEDLE ASPIRATION BIOPSIES;  Surgeon: Raechel Chute, MD;  Location: [facility] ENDOSCOPY;  Service: Pulmonary;;   ENDOBRONCHIAL ULTRASOUND  03/22/2022   Procedure: ENDOBRONCHIAL ULTRASOUND;  Surgeon: Raechel Chute, MD;  Location: [facility] ENDOS[facility];  Service: Pulmonary;;   EXPLORATORY LAPAROTOMY  05/12/2015   EYE SURGERY     FRACTURE SURGERY     LAPAROTOMY N/A 05/10/2015   Procedure: EXPLORATORY LAPAROTOMY;  Surgeon: Axel Filler, MD;  Location: [facility] OR;  Service: General;  Laterality: N/A;   LEFT HEART CATH AND CORONARY ANGIOGRAPHY N/A 05/02/2016   Procedure: Left Heart Cath and Coronary Angiography;  Surgeon: Yates Decamp, MD;  Location: [facility] INVASIVE CV LAB;  Service: Cardiovascular;  Laterality: N/A;    LOWER EXTREMITY ANGIOGRAPHY Left 09/21/2021   Procedure: Lower Extremity Angiography;  Surgeon: Renford Dills, MD;  Location: [facility] INVASIVE CV LAB;  Service: Cardiovascular;  Laterality: Left;    Social History   Socioeconomic History   Marital status: Single    Spouse name: Not on file   Number of children: Not on file   Years of education: Not on file   Highest education level: Not on file  Occupational History   Not on file  Tobacco Use   Smoking status: Every Day    Packs/day: 0.50    Years: 65.00    Additional pack years: 0.00    Total pack years: 32.50    Types: Cigarettes   Smokeless tobacco: Never   Tobacco comments:    0.5 PPD  Vaping Use   Vaping Use: Never used  Substance and Sexual Activity   Alcohol use: Yes    Alcohol/week: 60.0 standard drinks of alcohol    Types: 60 Standard drinks or equivalent per week    Comment: daily 12 pk of beer   Drug use: No   Sexual activity: Not Currently  Other Topics Concern   Not on file  Social History Narrative   [place] Veteran. Lives with sister and sister-in-law   Social Determinants of Health   Financial Resource Strain: Low Risk  (01/23/2022)   Overall Financial Resource Strain (CARDIA)    Difficulty  of Paying Living Expenses: Not very hard  Food Insecurity: Food Insecurity Present (03/28/2022)   Hunger Vital Sign    Worried About Running Out of Food in the Last Year: Often true    Ran Out of Food in the Last Year: Often true  Transportation Needs: No Transportation Needs (03/28/2022)   PRAPARE - Administrator, Civil Service (Medical): No    Lack of Transportation (Non-Medical): No  Physical Activity: Inactive (01/23/2022)   Exercise Vital Sign    Days of Exercise per Week: 0 days    Minutes of Exercise per Session: 0 min  Stress: No Stress Concern Present (09/09/2021)   [employer] of Occupational Health - Occupational Stress Questionnaire    Feeling of Stress : Only a little  Social  Connections: Socially Isolated (09/09/2021)   Social Connection and Isolation Panel [NHANES]    Frequency of Communication with Friends and Family: More than three times a week    Frequency of Social Gatherings with Friends and Family: More than three times a week    Attends Religious Services: Never    Database administrator or Organizations: No    Attends Banker Meetings: Never    Marital Status: Never married  Intimate Partner Violence: Not At Risk (03/28/2022)   Humiliation, Afraid, Rape, and Kick questionnaire    Fear of Current or Ex-Partner: No    Emotionally Abused: No    Physically Abused: No    Sexually Abused: No    Family History  Problem Relation Age of Onset   Hypertension Mother    Dementia Mother    Cirrhosis Father    Cancer Father    Lung cancer Sister    Hypertension Sister    Arthritis Sister    Heart murmur Sister    Arrhythmia Sister     No Known Allergies  Review of Systems  Constitutional: Negative.   HENT: Negative.    Eyes: Negative.   Respiratory: Negative.    Cardiovascular: Negative.   Gastrointestinal: Negative.   Genitourinary: Negative.   Skin:  Positive for itching and rash.  Neurological: Negative.   Endo/Heme/Allergies: Negative.        Objective:   BP 138/82   Pulse 75   Ht 6\' 1"  (1.854 m)   Wt 229 lb 12.8 oz (104.2 kg)   SpO2 96%   BMI 30.32 kg/m   Vitals:   08/21/22 1403  BP: 138/82  Pulse: 75  Height: 6\' 1"  (1.854 m)  Weight: 229 lb 12.8 oz (104.2 kg)  SpO2: 96%  BMI (Calculated): 30.33    Physical Exam Vitals reviewed.  Constitutional:      Appearance: Normal appearance.  HENT:     Head: Normocephalic.     Left Ear: There is no impacted cerumen.     Nose: Nose normal.     Mouth/Throat:     Mouth: Mucous membranes are moist.     Pharynx: No posterior oropharyngeal erythema.  Eyes:     Extraocular Movements: Extraocular movements intact.     Pupils: Pupils are equal, round, and reactive to  light.  Cardiovascular:     Rate and Rhythm: Regular rhythm.     Chest Wall: PMI is not displaced.     Pulses: Normal pulses.     Heart sounds: Normal heart sounds. No murmur heard. Pulmonary:     Effort: Pulmonary effort is normal.     Breath sounds: Normal air entry. No rhonchi or rales.  Abdominal:     General: Abdomen is flat. Bowel sounds are normal. There is no distension.     Palpations: Abdomen is soft. There is no hepatomegaly, splenomegaly or mass.     Tenderness: There is no abdominal tenderness.  Musculoskeletal:        General: Normal range of motion.     Cervical back: Normal range of motion and neck supple.     Right lower leg: No edema.     Left lower leg: No edema.  Skin:    General: Skin is warm and dry.  Neurological:     General: No focal deficit present.     Mental Status: He is alert and oriented to person, place, and time.     Cranial Nerves: No cranial nerve deficit.     Motor: No weakness.  Psychiatric:        Mood and Affect: Mood normal.        Behavior: Behavior normal.      No results found for any visits on 08/21/22.      Assessment & Plan:  As per problem list. Problem List Items Addressed This Visit       Respiratory   Chronic obstructive pulmonary disease (COPD) (HCC)   Relevant Medications   Budeson-Glycopyrrol-Formoterol (BREZTRI AEROSPHERE) 160-9-4.8 MCG/ACT AERO   fluticasone (FLONASE) 50 MCG/ACT nasal spray   cetirizine (ZYRTEC ALLERGY) 10 MG tablet     Other   Hyperlipidemia   Other Visit Diagnoses     Allergic rhinitis due to pollen, unspecified seasonality    -  Primary   Relevant Medications   fluticasone (FLONASE) 50 MCG/ACT nasal spray   cetirizine (ZYRTEC ALLERGY) 10 MG tablet   Tinea cruris       Relevant Medications   clotrimazole-betamethasone (LOTRISONE) cream       Return in about 2 weeks (around 09/04/2022) for awv with labs prior.   Total time spent: 40 minutes  Luna Fuse,  MD  08/21/2022   This document may have been prepared by [facility] Voice Recognition software and as such may include unintentional dictation errors.

## 2022-08-22 ENCOUNTER — Inpatient Hospital Stay: Payer: No Typology Code available for payment source | Admitting: Oncology

## 2022-08-22 NOTE — Progress Notes (Unsigned)
  [facility]  Telephone:([phone]) [phone] Fax:([phone]) [phone]  ID: LIBERATO BOAKYE OB: 12-08-1950  MR#: [id number]  AOZ#:[id number]  Patient Care Team: Corky Downs, MD as PCP - General (Internal Medicine) End, Cristal Deer, MD as PCP - Cardiology (Cardiology) Glory Buff, RN as Oncology Nurse Navigator  This encounter was created in error - please disregard.

## 2022-08-30 ENCOUNTER — Inpatient Hospital Stay: Payer: No Typology Code available for payment source | Admitting: Oncology

## 2022-09-06 ENCOUNTER — Inpatient Hospital Stay: Payer: No Typology Code available for payment source | Attending: Oncology | Admitting: Oncology

## 2022-09-07 ENCOUNTER — Encounter: Payer: Self-pay | Admitting: Cardiovascular Disease

## 2022-09-07 ENCOUNTER — Ambulatory Visit: Payer: No Typology Code available for payment source | Admitting: Cardiovascular Disease

## 2022-09-07 VITALS — BP 130/70 | HR 67 | Ht 73.0 in | Wt 225.8 lb

## 2022-09-07 DIAGNOSIS — I10 Essential (primary) hypertension: Secondary | ICD-10-CM | POA: Diagnosis not present

## 2022-09-07 DIAGNOSIS — R0789 Other chest pain: Secondary | ICD-10-CM | POA: Diagnosis not present

## 2022-09-07 DIAGNOSIS — E785 Hyperlipidemia, unspecified: Secondary | ICD-10-CM

## 2022-09-07 DIAGNOSIS — I739 Peripheral vascular disease, unspecified: Secondary | ICD-10-CM | POA: Diagnosis not present

## 2022-09-07 DIAGNOSIS — I4891 Unspecified atrial fibrillation: Secondary | ICD-10-CM

## 2022-09-07 DIAGNOSIS — F17210 Nicotine dependence, cigarettes, uncomplicated: Secondary | ICD-10-CM | POA: Diagnosis not present

## 2022-09-07 DIAGNOSIS — R0602 Shortness of breath: Secondary | ICD-10-CM | POA: Diagnosis not present

## 2022-09-07 DIAGNOSIS — F339 Major depressive disorder, recurrent, unspecified: Secondary | ICD-10-CM | POA: Diagnosis not present

## 2022-09-07 NOTE — Progress Notes (Signed)
Cardiology Office Note   Date:  09/07/2022   ID:  Gregory Lane, DOB October 11, 1950, MRN [id number]  PCP:  Corky Downs, MD  Cardiologist:  Adrian Blackwater, MD      History of Present Illness: Gregory Lane is a 72 y.o. male who presents for  Chief Complaint  Patient presents with  . Follow-up    1 mo F/U    Feeling deppressed     Past Medical History:  Diagnosis Date  . Anxiety   . Aortic atherosclerosis (HCC) 02/16/2017  . Arthritis   . Blind left eye   . COPD (chronic obstructive pulmonary disease) (HCC)   . Depression   . Dysrhythmia    A-fib  . GERD (gastroesophageal reflux disease)   . Hypertension   . Low TSH level 02/17/2017  . Myocardial infarction (HCC)   . PAD (peripheral artery disease) (HCC)   . Paroxysmal atrial fibrillation (HCC)   . Perforated duodenal ulcer (HCC)   . Stroke [facility])    left side is weak     Past Surgical History:  Procedure Laterality Date  . BRONCHIAL BIOPSY  03/22/2022   Procedure: BRONCHIAL BIOPSIES;  Surgeon: Raechel Chute, MD;  Location: [facility] ENDOSCOPY;  Service: Pulmonary;;  . BRONCHIAL NEEDLE ASPIRATION BIOPSY  03/22/2022   Procedure: BRONCHIAL NEEDLE ASPIRATION BIOPSIES;  Surgeon: Raechel Chute, MD;  Location: [facility] ENDOSCOPY;  Service: Pulmonary;;  . ENDOBRONCHIAL ULTRASOUND  03/22/2022   Procedure: ENDOBRONCHIAL ULTRASOUND;  Surgeon: Raechel Chute, MD;  Location: [facility] ENDOSCOPY;  Service: Pulmonary;;  . EXPLORATORY LAPAROTOMY  05/12/2015  . EYE SURGERY    . FRACTURE SURGERY    . LAPAROTOMY N/A 05/10/2015   Procedure: EXPLORATORY LAPAROTOMY;  Surgeon: Axel Filler, MD;  Location: [facility] OR;  Service: General;  Laterality: N/A;  . LEFT HEART CATH AND CORONARY ANGIOGRAPHY N/A 05/02/2016   Procedure: Left Heart Cath and Coronary Angiography;  Surgeon: Yates Decamp, MD;  Location: [facility] INVASIVE CV LAB;  Service: Cardiovascular;  Laterality: N/A;  . LOWER EXTREMITY ANGIOGRAPHY Left 09/21/2021   Procedure: Lower Extremity Angiography;   Surgeon: Renford Dills, MD;  Location: [facility] INVASIVE CV LAB;  Service: Cardiovascular;  Laterality: Left;     Current Outpatient Medications  Medication Sig Dispense Refill  . atorvastatin (LIPITOR) 40 MG tablet TAKE 1 TABLET BY MOUTH EVERY DAY 90 tablet 3  . Budeson-Glycopyrrol-Formoterol (BREZTRI AEROSPHERE) 160-9-4.8 MCG/ACT AERO Inhale 1 puff into the lungs as needed. 10.7 g 2  . cetirizine (ZYRTEC ALLERGY) 10 MG tablet Take 1 tablet (10 mg total) by mouth daily. 30 tablet 1  . clotrimazole-betamethasone (LOTRISONE) cream Apply 1 Application topically 2 (two) times daily. 30 g 0  . FLUoxetine (PROZAC) 20 MG capsule Take 2 capsules (40 mg total) by mouth daily. 60 capsule 1  . fluticasone (FLONASE) 50 MCG/ACT nasal spray Place 1 spray into both nostrils daily. 16 mL 1  . furosemide (LASIX) 20 MG tablet Take 20 mg by mouth daily.    Marland Kitchen KLOR-CON M20 20 MEQ tablet Take 20 mEq by mouth 2 (two) times daily.    Marland Kitchen losartan (COZAAR) 50 MG tablet Take 1 tablet (50 mg total) by mouth 2 (two) times daily. 60 tablet 11  . pantoprazole (PROTONIX) 40 MG tablet Take 1 tablet (40 mg total) by mouth 2 (two) times daily. 180 tablet 1  . warfarin (COUMADIN) 4 MG tablet Take 1 tablet (4 mg total) by mouth daily. Take 1 daily Monday  through Friday. Do not take Saturday  and Sunday.Take 4 mg by mouth daily. 60 tablet 4   No current facility-administered medications for this visit.    Allergies:   Patient has no known allergies.    Social History:   reports that he has been smoking cigarettes. He has a 32.5 pack-year smoking history. He has never used smokeless tobacco. He reports current alcohol use of about 60.0 standard drinks of alcohol per week. He reports that he does not use drugs.   Family History:  family history includes Arrhythmia in his sister; Arthritis in his sister; Cancer in his father; Cirrhosis in his father; Dementia in his mother; Heart murmur in his sister; Hypertension in his mother  and sister; Lung cancer in his sister.    ROS:     Review of Systems  Constitutional: Negative.   HENT: Negative.    Eyes: Negative.   Respiratory: Negative.    Gastrointestinal: Negative.   Genitourinary: Negative.   Musculoskeletal: Negative.   Skin: Negative.   Neurological: Negative.   Endo/Heme/Allergies: Negative.   Psychiatric/Behavioral: Negative.    All other systems reviewed and are negative.     All other systems are reviewed and negative.    PHYSICAL EXAM: VS:  BP 130/70   Pulse 67   Ht 6\' 1"  (1.854 m)   Wt 225 lb 12.8 oz (102.4 kg)   SpO2 97%   BMI 29.79 kg/m  , BMI Body mass index is 29.79 kg/m. Last weight:  Wt Readings from Last 3 Encounters:  09/07/22 225 lb 12.8 oz (102.4 kg)  08/21/22 229 lb 12.8 oz (104.2 kg)  08/08/22 231 lb 6.4 oz (105 kg)     Physical Exam Vitals reviewed.  Constitutional:      Appearance: Normal appearance. He is normal weight.  HENT:     Head: Normocephalic.     Nose: Nose normal.     Mouth/Throat:     Mouth: Mucous membranes are moist.  Eyes:     Pupils: Pupils are equal, round, and reactive to light.  Cardiovascular:     Rate and Rhythm: Normal rate and regular rhythm.     Pulses: Normal pulses.     Heart sounds: Normal heart sounds.  Pulmonary:     Effort: Pulmonary effort is normal.  Abdominal:     General: Abdomen is flat. Bowel sounds are normal.  Musculoskeletal:        General: Normal range of motion.     Cervical back: Normal range of motion.  Skin:    General: Skin is warm.  Neurological:     General: No focal deficit present.     Mental Status: He is alert.  Psychiatric:        Mood and Affect: Mood normal.      EKG:   Recent Labs: 09/24/2021: Magnesium 2.0 06/08/2022: BUN 15; Creatinine, Ser 0.99; Hemoglobin 14.1; Platelets 183; Potassium 4.4; Sodium 138    Lipid Panel    Component Value Date/Time   CHOL 235 (H) 12/19/2019 1114   TRIG 155 (H) 12/19/2019 1114   HDL 46 12/19/2019 1114    CHOLHDL 5.1 (H) 12/19/2019 1114   VLDL 17 05/24/2015 2320   LDLCALC 160 (H) 12/19/2019 1114      Other studies Reviewed: Additional studies/ records that were reviewed today include:  Review of the above records demonstrates:      03/01/2022    2:50 PM  PAD Screen  Previous PAD dx? Yes  Previous surgical procedure? Yes  Dates of procedures 2014  Pain with walking? Yes  Subsides with rest? No  Feet/toe relief with dangling? No  Painful, non-healing ulcers? No  Extremities discolored? Yes      ASSESSMENT AND PLAN:    ICD-10-CM   1. Other chest pain  R07.89    intermittanly    2. SOB (shortness of breath)  R06.02     3. Episode of recurrent major depressive disorder, unspecified depression episode severity (HCC)  F33.9     4. Dyslipidemia  E78.5     5. Essential hypertension  I10     6. Atrial fibrillation, unspecified type (HCC)  I48.91    stable    7. Cigarette smoker  F17.210     8. PAD (peripheral artery disease) (HCC)  I73.9    had vascular surgery with thrombectomy       Problem List Items Addressed This Visit       Cardiovascular and Mediastinum   Essential hypertension   PAD (peripheral artery disease) (HCC)     Other   SOB (shortness of breath)   Cigarette smoker   Dyslipidemia   Depression   Other Visit Diagnoses     Other chest pain    -  Primary   intermittanly   Atrial fibrillation, unspecified type (HCC)       stable          Disposition:   Return in about 2 months (around 11/08/2022).    Total time spent: 30 minutes  Signed,  Adrian Blackwater, MD  09/07/2022 1:32 PM    [facility]

## 2022-09-12 ENCOUNTER — Ambulatory Visit: Payer: No Typology Code available for payment source | Admitting: Internal Medicine

## 2022-09-13 ENCOUNTER — Telehealth: Payer: Self-pay

## 2022-09-13 NOTE — Telephone Encounter (Signed)
I spoke to the patient and he informed me that Dr Welton Flakes is now managing his Warfarin.  I removed him from our overdue listing.

## 2022-11-09 ENCOUNTER — Ambulatory Visit: Payer: No Typology Code available for payment source | Admitting: Cardiovascular Disease

## 2022-11-09 ENCOUNTER — Encounter: Payer: Self-pay | Admitting: Cardiovascular Disease

## 2022-11-09 VITALS — BP 130/89 | HR 57 | Ht 73.0 in | Wt 222.4 lb

## 2022-11-09 DIAGNOSIS — F17210 Nicotine dependence, cigarettes, uncomplicated: Secondary | ICD-10-CM | POA: Diagnosis not present

## 2022-11-09 DIAGNOSIS — E785 Hyperlipidemia, unspecified: Secondary | ICD-10-CM | POA: Diagnosis not present

## 2022-11-09 DIAGNOSIS — R0602 Shortness of breath: Secondary | ICD-10-CM

## 2022-11-09 DIAGNOSIS — I10 Essential (primary) hypertension: Secondary | ICD-10-CM | POA: Diagnosis not present

## 2022-11-09 DIAGNOSIS — R0789 Other chest pain: Secondary | ICD-10-CM | POA: Diagnosis not present

## 2022-11-09 DIAGNOSIS — I4891 Unspecified atrial fibrillation: Secondary | ICD-10-CM

## 2022-11-09 NOTE — Progress Notes (Signed)
Cardiology Office Note   Date:  11/09/2022   ID:  Gregory Lane, DOB 1950-03-29, MRN [id number]  PCP:  Corky Downs, MD  Cardiologist:  Adrian Blackwater, MD      History of Present Illness: Gregory Lane is a 72 y.o. male who presents for  Chief Complaint  Patient presents with   Follow-up    2 mo f/u    Chest Pain  The current episode started in the past 7 days. The onset quality is gradual. The problem occurs 2 to 4 times per day. The problem has been rapidly worsening. The pain is present in the substernal region. The pain is at a severity of 5/10. The pain is moderate. The quality of the pain is described as tightness. The pain radiates to the right arm. Associated symptoms include dizziness, palpitations and shortness of breath.      Past Medical History:  Diagnosis Date   Anxiety    Aortic atherosclerosis (HCC) 02/16/2017   Arthritis    Blind left eye    COPD (chronic obstructive pulmonary disease) (HCC)    Depression    Dysrhythmia    A-fib   GERD (gastroesophageal reflux disease)    Hypertension    Low TSH level 02/17/2017   Myocardial infarction [facility])    PAD (peripheral artery disease) (HCC)    Paroxysmal atrial fibrillation (HCC)    Perforated duodenal ulcer (HCC)    Stroke (HCC)    left side is weak     Past Surgical History:  Procedure Laterality Date   BRONCHIAL BIOPSY  03/22/2022   Procedure: BRONCHIAL BIOPSIES;  Surgeon: Raechel Chute, MD;  Location: [facility];  Service: Pulmonary;;   BRONCHIAL NEEDLE ASPIRATION BIOPSY  03/22/2022   Procedure: BRONCHIAL NEEDLE ASPIRATION BIOPSIES;  Surgeon: Raechel Chute, MD;  Location: [facility];  Service: Pulmonary;;   ENDOBRONCHIAL ULTRASOUND  03/22/2022   Procedure: ENDOBRONCHIAL ULTRASOUND;  Surgeon: Raechel Chute, MD;  Location: [facility];  Service: Pulmonary;;   EXPLORATORY LAPAROTOMY  05/12/2015   EYE SURGERY     FRACTURE SURGERY     LAPAROTOMY N/A 05/10/2015   Procedure: EXPLORATORY  LAPAROTOMY;  Surgeon: Axel Filler, MD;  Location: [facility] OR;  Service: General;  Laterality: N/A;   LEFT HEART CATH AND CORONARY ANGIOGRAPHY N/A 05/02/2016   Procedure: Left Heart Cath and Coronary Angiography;  Surgeon: Yates Decamp, MD;  [facility]: [facility] INVASIVE CV LAB;  Service: Cardiovascular;  Laterality: N/A;   LOWER EXTREMITY ANGIOGRAPHY Left 09/21/2021   Procedure: Lower Extremity Angiography;  Surgeon: Renford Dills, MD;  Location: [facility] INVASIVE CV LAB;  Service: Cardiovascular;  Laterality: Left;     Current Outpatient Medications  Medication Sig Dispense Refill   atorvastatin (LIPITOR) 40 MG tablet TAKE 1 TABLET BY MOUTH EVERY DAY 90 tablet 3   Budeson-Glycopyrrol-Formoterol (BREZTRI AEROSPHERE) 160-9-4.8 MCG/ACT AERO Inhale 1 puff into the lungs as needed. 10.7 g 2   cetirizine (ZYRTEC ALLERGY) 10 MG tablet Take 1 tablet (10 mg total) by mouth daily. 30 tablet 1   clotrimazole-betamethasone (LOTRISONE) cream Apply 1 Application topically 2 (two) times daily. 30 g 0   FLUoxetine (PROZAC) 20 MG capsule Take 2 capsules (40 mg total) by mouth daily. 60 capsule 1   fluticasone (FLONASE) 50 MCG/ACT nasal spray Place 1 spray into both nostrils daily. 16 mL 1   furosemide (LASIX) 20 MG tablet Take 20 mg by mouth daily.     KLOR-CON M20 20 MEQ tablet Take 20 mEq by mouth 2 (  two) times daily.     losartan (COZAAR) 50 MG tablet Take 1 tablet (50 mg total) by mouth 2 (two) times daily. 60 tablet 11   pantoprazole (PROTONIX) 40 MG tablet Take 1 tablet (40 mg total) by mouth 2 (two) times daily. 180 tablet 1   warfarin (COUMADIN) 4 MG tablet Take 1 tablet (4 mg total) by mouth daily. Take 1 daily Monday  through Friday. Do not take Saturday and Sunday.Take 4 mg by mouth daily. 60 tablet 4   No current facility-administered medications for this visit.    Allergies:   Patient has no known allergies.    Social History:   reports that he has been smoking cigarettes. He has a 32.5 pack-year smoking  history. He has never used smokeless tobacco. He reports current alcohol use of about 60.0 standard drinks of alcohol per week. He reports that he does not use drugs.   Family History:  family history includes Arrhythmia in his sister; Arthritis in his sister; Cancer in his father; Cirrhosis in his father; Dementia in his mother; Heart murmur in his sister; Hypertension in his mother and sister; Lung cancer in his sister.    ROS:     Review of Systems  Constitutional: Negative.   HENT: Negative.    Eyes: Negative.   Respiratory:  Positive for shortness of breath.   Cardiovascular:  Positive for chest pain and palpitations.  Gastrointestinal: Negative.   Genitourinary: Negative.   Musculoskeletal: Negative.   Skin: Negative.   Neurological:  Positive for dizziness.  Endo/Heme/Allergies: Negative.   Psychiatric/Behavioral: Negative.    All other systems reviewed and are negative.     All other systems are reviewed and negative.    PHYSICAL EXAM: VS:  BP 130/89   Pulse (!) 57   Ht 6\' 1"  (1.854 m)   Wt 222 lb 6.4 oz (100.9 kg)   SpO2 98%   BMI 29.34 kg/m  , BMI Body mass index is 29.34 kg/m. Last weight:  Wt Readings from Last 3 Encounters:  11/09/22 222 lb 6.4 oz (100.9 kg)  09/07/22 225 lb 12.8 oz (102.4 kg)  08/21/22 229 lb 12.8 oz (104.2 kg)     Physical Exam Vitals reviewed.  Constitutional:      Appearance: Normal appearance. He is normal weight.  HENT:     Head: Normocephalic.     Nose: Nose normal.     Mouth/Throat:     Mouth: Mucous membranes are moist.  Eyes:     Pupils: Pupils are equal, round, and reactive to light.  Cardiovascular:     Rate and Rhythm: Normal rate and regular rhythm.     Pulses: Normal pulses.     Heart sounds: Normal heart sounds.  Pulmonary:     Effort: Pulmonary effort is normal.  Abdominal:     General: Abdomen is flat. Bowel sounds are normal.  Musculoskeletal:        General: Normal range of motion.     Cervical back:  Normal range of motion.  Skin:    General: Skin is warm.  Neurological:     General: No focal deficit present.     Mental Status: He is alert.  Psychiatric:        Mood and Affect: Mood normal.       EKG:   Recent Labs: 06/08/2022: BUN 15; Creatinine, Ser 0.99; Hemoglobin 14.1; Platelets 183; Potassium 4.4; Sodium 138    Lipid Panel    Component Value Date/Time  CHOL 235 (H) 12/19/2019 1114   TRIG 155 (H) 12/19/2019 1114   HDL 46 12/19/2019 1114   CHOLHDL 5.1 (H) 12/19/2019 1114   VLDL 17 05/24/2015 2320   LDLCALC 160 (H) 12/19/2019 1114      Other studies Reviewed: Additional studies/ records that were reviewed today include:  Review of the above records demonstrates:      03/01/2022    2:50 PM  PAD Screen  Previous PAD dx? Yes  Previous surgical procedure? Yes  Dates of procedures 2014  Pain with walking? Yes  Subsides with rest? No  Feet/toe relief with dangling? No  Painful, non-healing ulcers? No  Extremities discolored? Yes      ASSESSMENT AND PLAN:    ICD-10-CM   1. SOB (shortness of breath)  R06.02 MYOCARDIAL PERFUSION IMAGING    PCV ECHOCARDIOGRAM COMPLETE    2. Dyslipidemia  E78.5 MYOCARDIAL PERFUSION IMAGING    PCV ECHOCARDIOGRAM COMPLETE    3. Cigarette smoker  F17.210 MYOCARDIAL PERFUSION IMAGING    PCV ECHOCARDIOGRAM COMPLETE    4. Atrial fibrillation, unspecified type (HCC)  I48.91 MYOCARDIAL PERFUSION IMAGING    PCV ECHOCARDIOGRAM COMPLETE    5. Essential hypertension  I10 MYOCARDIAL PERFUSION IMAGING    PCV ECHOCARDIOGRAM COMPLETE    6. Other chest pain  R07.89 MYOCARDIAL PERFUSION IMAGING    PCV ECHOCARDIOGRAM COMPLETE   Will schedual stress test and echo       Problem List Items Addressed This Visit       Cardiovascular and Mediastinum   Essential hypertension   Relevant Orders   MYOCARDIAL PERFUSION IMAGING   PCV ECHOCARDIOGRAM COMPLETE     Other   SOB (shortness of breath) - Primary   Relevant Orders    MYOCARDIAL PERFUSION IMAGING   PCV ECHOCARDIOGRAM COMPLETE   Cigarette smoker   Relevant Orders   MYOCARDIAL PERFUSION IMAGING   PCV ECHOCARDIOGRAM COMPLETE   Dyslipidemia   Relevant Orders   MYOCARDIAL PERFUSION IMAGING   PCV ECHOCARDIOGRAM COMPLETE   Other Visit Diagnoses     Atrial fibrillation, unspecified type (HCC)       Relevant Orders   MYOCARDIAL PERFUSION IMAGING   PCV ECHOCARDIOGRAM COMPLETE   Other chest pain       Will schedual stress test and echo   Relevant Orders   MYOCARDIAL PERFUSION IMAGING   PCV ECHOCARDIOGRAM COMPLETE          Disposition:   Return in about 3 weeks (around 11/30/2022) for echo, stress test and f/u.    Total time spent: 35 minutes  Signed,  Adrian Blackwater, MD  11/09/2022 1:30 PM    [facility]

## 2022-11-20 ENCOUNTER — Ambulatory Visit (INDEPENDENT_AMBULATORY_CARE_PROVIDER_SITE_OTHER): Payer: No Typology Code available for payment source

## 2022-11-20 DIAGNOSIS — R0602 Shortness of breath: Secondary | ICD-10-CM | POA: Diagnosis not present

## 2022-11-20 DIAGNOSIS — E785 Hyperlipidemia, unspecified: Secondary | ICD-10-CM

## 2022-11-20 DIAGNOSIS — R0789 Other chest pain: Secondary | ICD-10-CM

## 2022-11-20 DIAGNOSIS — I4891 Unspecified atrial fibrillation: Secondary | ICD-10-CM

## 2022-11-20 DIAGNOSIS — I10 Essential (primary) hypertension: Secondary | ICD-10-CM

## 2022-11-20 DIAGNOSIS — F17210 Nicotine dependence, cigarettes, uncomplicated: Secondary | ICD-10-CM

## 2022-11-20 MED ORDER — TECHNETIUM TC 99M SESTAMIBI GENERIC - CARDIOLITE
33.3000 | Freq: Once | INTRAVENOUS | Status: AC | PRN
Start: 2022-11-20 — End: 2022-11-20
  Administered 2022-11-20: 33.3 via INTRAVENOUS

## 2022-11-20 MED ORDER — TECHNETIUM TC 99M SESTAMIBI GENERIC - CARDIOLITE
9.5000 | Freq: Once | INTRAVENOUS | Status: AC | PRN
Start: 2022-11-20 — End: 2022-11-20
  Administered 2022-11-20: 9.5 via INTRAVENOUS

## 2022-11-23 ENCOUNTER — Ambulatory Visit: Payer: No Typology Code available for payment source

## 2022-11-23 DIAGNOSIS — I4891 Unspecified atrial fibrillation: Secondary | ICD-10-CM

## 2022-11-23 DIAGNOSIS — R0602 Shortness of breath: Secondary | ICD-10-CM

## 2022-11-23 DIAGNOSIS — E785 Hyperlipidemia, unspecified: Secondary | ICD-10-CM

## 2022-11-23 DIAGNOSIS — F17210 Nicotine dependence, cigarettes, uncomplicated: Secondary | ICD-10-CM

## 2022-11-23 DIAGNOSIS — I10 Essential (primary) hypertension: Secondary | ICD-10-CM

## 2022-11-23 DIAGNOSIS — I351 Nonrheumatic aortic (valve) insufficiency: Secondary | ICD-10-CM | POA: Diagnosis not present

## 2022-11-23 DIAGNOSIS — R0789 Other chest pain: Secondary | ICD-10-CM

## 2022-11-28 ENCOUNTER — Encounter: Payer: Self-pay | Admitting: Cardiovascular Disease

## 2022-11-28 ENCOUNTER — Ambulatory Visit (INDEPENDENT_AMBULATORY_CARE_PROVIDER_SITE_OTHER): Payer: No Typology Code available for payment source | Admitting: Cardiovascular Disease

## 2022-11-28 VITALS — BP 142/73 | HR 55 | Ht 73.0 in | Wt 217.0 lb

## 2022-11-28 DIAGNOSIS — I739 Peripheral vascular disease, unspecified: Secondary | ICD-10-CM | POA: Diagnosis not present

## 2022-11-28 DIAGNOSIS — I2510 Atherosclerotic heart disease of native coronary artery without angina pectoris: Secondary | ICD-10-CM | POA: Diagnosis not present

## 2022-11-28 DIAGNOSIS — R0789 Other chest pain: Secondary | ICD-10-CM | POA: Diagnosis not present

## 2022-11-28 DIAGNOSIS — I10 Essential (primary) hypertension: Secondary | ICD-10-CM | POA: Diagnosis not present

## 2022-11-28 DIAGNOSIS — F17210 Nicotine dependence, cigarettes, uncomplicated: Secondary | ICD-10-CM

## 2022-11-28 DIAGNOSIS — I6340 Cerebral infarction due to embolism of unspecified cerebral artery: Secondary | ICD-10-CM | POA: Diagnosis not present

## 2022-11-28 DIAGNOSIS — I4892 Unspecified atrial flutter: Secondary | ICD-10-CM

## 2022-11-28 NOTE — Progress Notes (Signed)
Cardiology Office Note   Date:  11/28/2022   ID:  Gregory Lane, DOB Jan 21, 1951, MRN [id number]  PCP:  Corky Downs, MD  Cardiologist:  Adrian Blackwater, MD      History of Present Illness: Gregory Lane is a 72 y.o. male who presents for No chief complaint on file.   Chest Pain  This is a chronic problem. The current episode started more than 1 year ago. The problem has been gradually improving. The pain is at a severity of 4/10. The quality of the pain is described as sharp.      Past Medical History:  Diagnosis Date   Anxiety    Aortic atherosclerosis (HCC) 02/16/2017   Arthritis    Blind left eye    COPD (chronic obstructive pulmonary disease) (HCC)    Depression    Dysrhythmia    A-fib   GERD (gastroesophageal reflux disease)    Hypertension    Low TSH level 02/17/2017   Myocardial infarction [facility])    PAD (peripheral artery disease) (HCC)    Paroxysmal atrial fibrillation (HCC)    Perforated duodenal ulcer (HCC)    Stroke (HCC)    left side is weak     Past Surgical History:  Procedure Laterality Date   BRONCHIAL BIOPSY  03/22/2022   Procedure: BRONCHIAL BIOPSIES;  Surgeon: Raechel Chute, MD;  Location: [facility];  Service: Pulmonary;;   BRONCHIAL NEEDLE ASPIRATION BIOPSY  03/22/2022   Procedure: BRONCHIAL NEEDLE ASPIRATION BIOPSIES;  Surgeon: Raechel Chute, MD;  Location: [facility];  Service: Pulmonary;;   ENDOBRONCHIAL ULTRASOUND  03/22/2022   Procedure: ENDOBRONCHIAL ULTRASOUND;  Surgeon: Raechel Chute, MD;  Location: [facility];  Service: Pulmonary;;   EXPLORATORY LAPAROTOMY  05/12/2015   EYE SURGERY     FRACTURE SURGERY     LAPAROTOMY N/A 05/10/2015   Procedure: EXPLORATORY LAPAROTOMY;  Surgeon: Axel Filler, MD;  Location: [facility] OR;  Service: General;  Laterality: N/A;   LEFT HEART CATH AND CORONARY ANGIOGRAPHY N/A 05/02/2016   Procedure: Left Heart Cath and Coronary Angiography;  Surgeon: Yates Decamp, MD;  Location: [facility] INVASIVE CV LAB;   Service: Cardiovascular;  Laterality: N/A;   LOWER EXTREMITY ANGIOGRAPHY Left 09/21/2021   Procedure: Lower Extremity Angiography;  Surgeon: Renford Dills, MD;  Location: [facility] INVASIVE CV LAB;  Service: Cardiovascular;  Laterality: Left;     Current Outpatient Medications  Medication Sig Dispense Refill   aspirin 81 MG chewable tablet Chew 81 mg by mouth once.     atorvastatin (LIPITOR) 40 MG tablet TAKE 1 TABLET BY MOUTH EVERY DAY 90 tablet 3   cetirizine (ZYRTEC ALLERGY) 10 MG tablet Take 1 tablet (10 mg total) by mouth daily. 30 tablet 1   clotrimazole-betamethasone (LOTRISONE) cream Apply 1 Application topically 2 (two) times daily. 30 g 0   FLUoxetine (PROZAC) 20 MG capsule Take 2 capsules (40 mg total) by mouth daily. 60 capsule 1   fluticasone (FLONASE) 50 MCG/ACT nasal spray Place 1 spray into both nostrils daily. 16 mL 1   furosemide (LASIX) 20 MG tablet Take 20 mg by mouth daily.     KLOR-CON M20 20 MEQ tablet Take 20 mEq by mouth 2 (two) times daily.     losartan (COZAAR) 50 MG tablet Take 1 tablet (50 mg total) by mouth 2 (two) times daily. 60 tablet 11   pantoprazole (PROTONIX) 40 MG tablet Take 1 tablet (40 mg total) by mouth 2 (two) times daily. 180 tablet 1   warfarin (COUMADIN)  4 MG tablet Take 1 tablet (4 mg total) by mouth daily. Take 1 daily Monday  through Friday. Do not take Saturday and Sunday.Take 4 mg by mouth daily. 60 tablet 4   No current facility-administered medications for this visit.    Allergies:   Patient has no known allergies.    Social History:   reports that he has been smoking cigarettes. He has a 32.5 pack-year smoking history. He has never used smokeless tobacco. He reports current alcohol use of about 60.0 standard drinks of alcohol per week. He reports that he does not use drugs.   Family History:  family history includes Arrhythmia in his sister; Arthritis in his sister; Cancer in his father; Cirrhosis in his father; Dementia in his mother;  Heart murmur in his sister; Hypertension in his mother and sister; Lung cancer in his sister.    ROS:     Review of Systems  Constitutional: Negative.   HENT: Negative.    Eyes: Negative.   Respiratory: Negative.    Cardiovascular:  Positive for chest pain.  Gastrointestinal: Negative.   Genitourinary: Negative.   Musculoskeletal: Negative.   Skin: Negative.   Neurological: Negative.   Endo/Heme/Allergies: Negative.   Psychiatric/Behavioral: Negative.    All other systems reviewed and are negative.     All other systems are reviewed and negative.    PHYSICAL EXAM: VS:  BP (!) 142/73   Pulse (!) 55   Ht 6\' 1"  (1.854 m)   Wt 217 lb (98.4 kg)   SpO2 97%   BMI 28.63 kg/m  , BMI Body mass index is 28.63 kg/m. Last weight:  Wt Readings from Last 3 Encounters:  11/28/22 217 lb (98.4 kg)  11/09/22 222 lb 6.4 oz (100.9 kg)  09/07/22 225 lb 12.8 oz (102.4 kg)     Physical Exam Vitals reviewed.  Constitutional:      Appearance: Normal appearance. He is normal weight.  HENT:     Head: Normocephalic.     Nose: Nose normal.     Mouth/Throat:     Mouth: Mucous membranes are moist.  Eyes:     Pupils: Pupils are equal, round, and reactive to light.  Cardiovascular:     Rate and Rhythm: Normal rate and regular rhythm.     Pulses: Normal pulses.     Heart sounds: Normal heart sounds.  Pulmonary:     Effort: Pulmonary effort is normal.  Abdominal:     General: Abdomen is flat. Bowel sounds are normal.  Musculoskeletal:        General: Normal range of motion.     Cervical back: Normal range of motion.  Skin:    General: Skin is warm.  Neurological:     General: No focal deficit present.     Mental Status: He is alert.  Psychiatric:        Mood and Affect: Mood normal.       EKG:   Recent Labs: 06/08/2022: BUN 15; Creatinine, Ser 0.99; Hemoglobin 14.1; Platelets 183; Potassium 4.4; Sodium 138    Lipid Panel    Component Value Date/Time   CHOL 235 (H)  12/19/2019 1114   TRIG 155 (H) 12/19/2019 1114   HDL 46 12/19/2019 1114   CHOLHDL 5.1 (H) 12/19/2019 1114   VLDL 17 05/24/2015 2320   LDLCALC 160 (H) 12/19/2019 1114      Other studies Reviewed: Additional studies/ records that were reviewed today include:  Review of the above records demonstrates:  03/01/2022    2:50 PM  PAD Screen  Previous PAD dx? Yes  Previous surgical procedure? Yes  Dates of procedures 2014  Pain with walking? Yes  Subsides with rest? No  Feet/toe relief with dangling? No  Painful, non-healing ulcers? No  Extremities discolored? Yes      ASSESSMENT AND PLAN:    ICD-10-CM   1. PAD (peripheral artery disease) (HCC)  I73.9     2. Coronary artery disease involving native coronary artery of native heart without angina pectoris  I25.10    stress test was normal    3. Essential hypertension  I10     4. Atrial flutter with rapid ventricular response (HCC)  I48.92     5. Cerebrovascular accident (CVA) due to embolism of cerebral artery (HCC)  I63.40     6. Chest pain, non-cardiac  R07.89    stress test normal, echo normal LVEF, mild MR. Continue protonix as GERD cause of it       Problem List Items Addressed This Visit       Cardiovascular and Mediastinum   Essential hypertension   Relevant Medications   aspirin 81 MG chewable tablet   Atrial flutter with rapid ventricular response (HCC)   Relevant Medications   aspirin 81 MG chewable tablet   Cerebrovascular accident (CVA) due to embolism of cerebral artery (HCC)   Relevant Medications   aspirin 81 MG chewable tablet   PAD (peripheral artery disease) (HCC) - Primary   Relevant Medications   aspirin 81 MG chewable tablet   Coronary artery disease   Relevant Medications   aspirin 81 MG chewable tablet     Other   Chest pain, non-cardiac       Disposition:   Return in about 3 months (around 02/28/2023).    Total time spent: 30 minutes  Signed,  Adrian Blackwater, MD  11/28/2022  2:17 PM    [facility]

## 2022-12-11 ENCOUNTER — Ambulatory Visit (INDEPENDENT_AMBULATORY_CARE_PROVIDER_SITE_OTHER): Payer: No Typology Code available for payment source | Admitting: Internal Medicine

## 2022-12-11 ENCOUNTER — Encounter: Payer: Self-pay | Admitting: Internal Medicine

## 2022-12-11 VITALS — BP 140/80 | HR 67 | Ht 73.0 in | Wt 217.2 lb

## 2022-12-11 DIAGNOSIS — Z0001 Encounter for general adult medical examination with abnormal findings: Secondary | ICD-10-CM | POA: Diagnosis not present

## 2022-12-11 DIAGNOSIS — Z23 Encounter for immunization: Secondary | ICD-10-CM | POA: Diagnosis not present

## 2022-12-11 DIAGNOSIS — Z1331 Encounter for screening for depression: Secondary | ICD-10-CM | POA: Diagnosis not present

## 2022-12-11 DIAGNOSIS — I10 Essential (primary) hypertension: Secondary | ICD-10-CM

## 2022-12-11 DIAGNOSIS — B356 Tinea cruris: Secondary | ICD-10-CM

## 2022-12-11 DIAGNOSIS — N401 Enlarged prostate with lower urinary tract symptoms: Secondary | ICD-10-CM

## 2022-12-11 DIAGNOSIS — J449 Chronic obstructive pulmonary disease, unspecified: Secondary | ICD-10-CM

## 2022-12-11 DIAGNOSIS — I2510 Atherosclerotic heart disease of native coronary artery without angina pectoris: Secondary | ICD-10-CM

## 2022-12-11 DIAGNOSIS — R351 Nocturia: Secondary | ICD-10-CM | POA: Diagnosis not present

## 2022-12-11 DIAGNOSIS — E782 Mixed hyperlipidemia: Secondary | ICD-10-CM

## 2022-12-11 DIAGNOSIS — J301 Allergic rhinitis due to pollen: Secondary | ICD-10-CM | POA: Insufficient documentation

## 2022-12-11 MED ORDER — CETIRIZINE HCL 10 MG PO TABS
10.0000 mg | ORAL_TABLET | Freq: Every day | ORAL | 1 refills | Status: DC
Start: 2022-12-11 — End: 2023-10-29

## 2022-12-11 MED ORDER — FLUTICASONE PROPIONATE 50 MCG/ACT NA SUSP: 1.0000 | Freq: Every day | NASAL | 1 refills | Status: DC

## 2022-12-11 MED ORDER — BREZTRI AEROSPHERE 160-9-4.8 MCG/ACT IN AERO
1.0000 | INHALATION_SPRAY | RESPIRATORY_TRACT | 2 refills | Status: DC | PRN
Start: 2022-12-11 — End: 2023-03-11

## 2022-12-11 MED ORDER — FLUOXETINE HCL 20 MG PO CAPS: 40.0000 mg | ORAL_CAPSULE | Freq: Every day | ORAL | 1 refills | Status: DC

## 2022-12-11 MED ORDER — CLOTRIMAZOLE-BETAMETHASONE 1-0.05 % EX CREA: 1.0000 | TOPICAL_CREAM | Freq: Two times a day (BID) | CUTANEOUS | 0 refills | Status: DC

## 2022-12-11 NOTE — Addendum Note (Signed)
Addended by: Hassell Halim on: 12/11/2022 02:00 PM   Modules accepted: Orders

## 2022-12-11 NOTE — Progress Notes (Signed)
Established Patient Office Visit  Subjective:  Patient ID: Gregory Lane, male    DOB: 01-27-1951  Age: 72 y.o. MRN: [id number]  No chief complaint on file.   No new complaints, here for AWV refer to quality metrics and scanned documents. Scored 6 on 6CIT, high depression score but hasn't filled his SSRI in the last 3 months.  No other concerns at this time.   Past Medical History:  Diagnosis Date   Anxiety    Aortic atherosclerosis (HCC) 02/16/2017   Arthritis    Blind left eye    COPD (chronic obstructive pulmonary disease) (HCC)    Depression    Dysrhythmia    A-fib   GERD (gastroesophageal reflux disease)    Hypertension    Low TSH level 02/17/2017   Myocardial infarction [facility])    PAD (peripheral artery disease) (HCC)    Paroxysmal atrial fibrillation (HCC)    Perforated duodenal ulcer (HCC)    Stroke (HCC)    left side is weak    Past Surgical History:  Procedure Laterality Date   BRONCHIAL BIOPSY  03/22/2022   Procedure: BRONCHIAL BIOPSIES;  Surgeon: Raechel Chute, MD;  Location: [facility];  Service: Pulmonary;;   BRONCHIAL NEEDLE ASPIRATION BIOPSY  03/22/2022   Procedure: BRONCHIAL NEEDLE ASPIRATION BIOPSIES;  Surgeon: Raechel Chute, MD;  Location: [facility];  Service: Pulmonary;;   ENDOBRONCHIAL ULTRASOUND  03/22/2022   Procedure: ENDOBRONCHIAL ULTRASOUND;  Surgeon: Raechel Chute, MD;  Location: [facility];  Service: Pulmonary;;   EXPLORATORY LAPAROTOMY  05/12/2015   EYE SURGERY     FRACTURE SURGERY     LAPAROTOMY N/A 05/10/2015   Procedure: EXPLORATORY LAPAROTOMY;  Surgeon: Axel Filler, MD;  Location: [facility] OR;  Service: General;  Laterality: N/A;   LEFT HEART CATH AND CORONARY ANGIOGRAPHY N/A 05/02/2016   Procedure: Left Heart Cath and Coronary Angiography;  Surgeon: Yates Decamp, MD;  Location: [facility] INVASIVE CV LAB;  Service: Cardiovascular;  Laterality: N/A;   LOWER EXTREMITY ANGIOGRAPHY Left 09/21/2021   Procedure: Lower Extremity Angiography;   Surgeon: Renford Dills, MD;  Location: [facility] INVASIVE CV LAB;  Service: Cardiovascular;  Laterality: Left;    Social History   Socioeconomic History   Marital status: Single    Spouse name: Not on file   Number of children: Not on file   Years of education: Not on file   Highest education level: Not on file  Occupational History   Not on file  Tobacco Use   Smoking status: Every Day    Current packs/day: 0.50    Average packs/day: 0.5 packs/day for 65.0 years (32.5 ttl pk-yrs)    Types: Cigarettes   Smokeless tobacco: Never   Tobacco comments:    0.5 PPD  Vaping Use   Vaping status: Never Used  Substance and Sexual Activity   Alcohol use: Yes    Alcohol/week: 60.0 standard drinks of alcohol    Types: 60 Standard drinks or equivalent per week    Comment: daily 12 pk of beer   Drug use: No   Sexual activity: Not Currently  Other Topics Concern   Not on file  Social History Narrative   [place] Veteran. Lives with sister and sister-in-law   Social Determinants of Health   Financial Resource Strain: Low Risk  (01/23/2022)   Overall Financial Resource Strain (CARDIA)    Difficulty of Paying Living Expenses: Not very hard  Food Insecurity: Food Insecurity Present (03/28/2022)   Hunger Vital Sign    Worried About  Running Out of Food in the Last Year: Often true    Ran Out of Food in the Last Year: Often true  Transportation Needs: No Transportation Needs (03/28/2022)   PRAPARE - Administrator, Civil Service (Medical): No    Lack of Transportation (Non-Medical): No  Physical Activity: Inactive (01/23/2022)   Exercise Vital Sign    Days of Exercise per Week: 0 days    Minutes of Exercise per Session: 0 min  Stress: No Stress Concern Present (09/09/2021)   [employer] of Occupational Health - Occupational Stress Questionnaire    Feeling of Stress : Only a little  Social Connections: Socially Isolated (09/09/2021)   Social Connection and Isolation Panel  [NHANES]    Frequency of Communication with Friends and Family: More than three times a week    Frequency of Social Gatherings with Friends and Family: More than three times a week    Attends Religious Services: Never    Database administrator or Organizations: No    Attends Banker Meetings: Never    Marital Status: Never married  Intimate Partner Violence: Not At Risk (03/28/2022)   Humiliation, Afraid, Rape, and Kick questionnaire    Fear of Current or Ex-Partner: No    Emotionally Abused: No    Physically Abused: No    Sexually Abused: No    Family History  Problem Relation Age of Onset   Hypertension Mother    Dementia Mother    Cirrhosis Father    Cancer Father    Lung cancer Sister    Hypertension Sister    Arthritis Sister    Heart murmur Sister    Arrhythmia Sister     No Known Allergies  Review of Systems  Constitutional: Negative.   HENT: Negative.    Eyes: Negative.   Respiratory: Negative.    Cardiovascular: Negative.   Gastrointestinal: Negative.   Genitourinary: Negative.   Skin:  Positive for itching and rash.  Neurological: Negative.   Endo/Heme/Allergies: Negative.        Objective:   BP (!) 140/80   Pulse 67   Ht 6\' 1"  (1.854 m)   Wt 217 lb 3.2 oz (98.5 kg)   SpO2 97%   BMI 28.66 kg/m   Vitals:   12/11/22 1311  BP: (!) 140/80  Pulse: 67  Height: 6\' 1"  (1.854 m)  Weight: 217 lb 3.2 oz (98.5 kg)  SpO2: 97%  BMI (Calculated): 28.66    Physical Exam Vitals reviewed.  Constitutional:      Appearance: Normal appearance.  HENT:     Head: Normocephalic.     Left Ear: There is no impacted cerumen.     Nose: Nose normal.     Mouth/Throat:     Mouth: Mucous membranes are moist.     Pharynx: No posterior oropharyngeal erythema.  Eyes:     Extraocular Movements: Extraocular movements intact.     Pupils: Pupils are equal, round, and reactive to light.  Cardiovascular:     Rate and Rhythm: Regular rhythm.     Chest Wall:  PMI is not displaced.     Pulses: Normal pulses.     Heart sounds: Normal heart sounds. No murmur heard. Pulmonary:     Effort: Pulmonary effort is normal.     Breath sounds: Normal air entry. No rhonchi or rales.  Abdominal:     General: Abdomen is flat. Bowel sounds are normal. There is no distension.     Palpations:  Abdomen is soft. There is no hepatomegaly, splenomegaly or mass.     Tenderness: There is no abdominal tenderness.  Musculoskeletal:        General: Normal range of motion.     Cervical back: Normal range of motion and neck supple.     Right lower leg: No edema.     Left lower leg: No edema.  Skin:    General: Skin is warm and dry.  Neurological:     General: No focal deficit present.     Mental Status: He is alert and oriented to person, place, and time.     Cranial Nerves: No cranial nerve deficit.     Motor: No weakness.  Psychiatric:        Mood and Affect: Mood normal.        Behavior: Behavior normal.      No results found for any visits on 12/11/22.  No results found for this or any previous visit (from the past 2160 hour(Bronc Brosseau)).    Assessment & Plan:  As per problem list. Patient instructed to comply with medications as prescribed. Problem List Items Addressed This Visit       Cardiovascular and Mediastinum   Coronary artery disease - Primary     Respiratory   Chronic obstructive pulmonary disease (COPD) (HCC)   Relevant Medications   Budeson-Glycopyrrol-Formoterol (BREZTRI AEROSPHERE) 160-9-4.8 MCG/ACT AERO   fluticasone (FLONASE) 50 MCG/ACT nasal spray   cetirizine (ZYRTEC ALLERGY) 10 MG tablet   Other Relevant Orders   CBC With Diff/Platelet   Allergic rhinitis due to pollen   Relevant Medications   fluticasone (FLONASE) 50 MCG/ACT nasal spray   FLUoxetine (PROZAC) 20 MG capsule   cetirizine (ZYRTEC ALLERGY) 10 MG tablet     Musculoskeletal and Integument   Tinea cruris   Relevant Medications   clotrimazole-betamethasone (LOTRISONE)  cream     Other   Hyperlipidemia   Relevant Orders   Comprehensive metabolic panel   Lipid panel   Other Visit Diagnoses     BPH associated with nocturia       Relevant Orders   PSA       Return in about 2 weeks (around 12/25/2022) for lab results.   Total time spent: 40 minutes  Luna Fuse, MD  12/11/2022   This document may have been prepared by [facility] Voice Recognition software and as such may include unintentional dictation errors.

## 2022-12-12 ENCOUNTER — Other Ambulatory Visit: Payer: Self-pay | Admitting: Internal Medicine

## 2022-12-12 DIAGNOSIS — J449 Chronic obstructive pulmonary disease, unspecified: Secondary | ICD-10-CM

## 2022-12-12 LAB — CBC WITH DIFF/PLATELET
Basophils Absolute: 0.1 10*3/uL (ref 0.0–0.2)
Basos: 1 %
EOS (ABSOLUTE): 0.3 10*3/uL (ref 0.0–0.4)
Eos: 4 %
Hematocrit: 47.9 % (ref 37.5–51.0)
Hemoglobin: 16.3 g/dL (ref 13.0–17.7)
Immature Grans (Abs): 0 10*3/uL (ref 0.0–0.1)
Immature Granulocytes: 0 %
Lymphocytes Absolute: 1 10*3/uL (ref 0.7–3.1)
Lymphs: 15 %
MCH: 35.4 pg — ABNORMAL HIGH (ref 26.6–33.0)
MCHC: 34 g/dL (ref 31.5–35.7)
MCV: 104 fL — ABNORMAL HIGH (ref 79–97)
Monocytes Absolute: 0.7 10*3/uL (ref 0.1–0.9)
Monocytes: 10 %
Neutrophils Absolute: 4.9 10*3/uL (ref 1.4–7.0)
Neutrophils: 70 %
Platelets: 234 10*3/uL (ref 150–450)
RBC: 4.6 x10E6/uL (ref 4.14–5.80)
RDW: 12.7 % (ref 11.6–15.4)
WBC: 7 10*3/uL (ref 3.4–10.8)

## 2022-12-12 LAB — COMPREHENSIVE METABOLIC PANEL
ALT: 20 [IU]/L (ref 0–44)
AST: 17 [IU]/L (ref 0–40)
Albumin: 4.1 g/dL (ref 3.8–4.8)
Alkaline Phosphatase: 119 [IU]/L (ref 44–121)
BUN/Creatinine Ratio: 17 (ref 10–24)
BUN: 17 mg/dL (ref 8–27)
Bilirubin Total: 0.5 mg/dL (ref 0.0–1.2)
CO2: 22 mmol/L (ref 20–29)
Calcium: 9.2 mg/dL (ref 8.6–10.2)
Chloride: 98 mmol/L (ref 96–106)
Creatinine, Ser: 1.03 mg/dL (ref 0.76–1.27)
Globulin, Total: 3.2 g/dL (ref 1.5–4.5)
Glucose: 82 mg/dL (ref 70–99)
Potassium: 5.3 mmol/L — ABNORMAL HIGH (ref 3.5–5.2)
Sodium: 137 mmol/L (ref 134–144)
Total Protein: 7.3 g/dL (ref 6.0–8.5)
eGFR: 76 mL/min/{1.73_m2} (ref >59)

## 2022-12-12 LAB — LIPID PANEL
Chol/HDL Ratio: 4 ratio (ref 0.0–5.0)
Cholesterol, Total: 175 mg/dL (ref 100–199)
HDL: 44 mg/dL (ref >39)
LDL Chol Calc (NIH): 109 mg/dL — ABNORMAL HIGH (ref 0–99)
Triglycerides: 124 mg/dL (ref 0–149)
VLDL Cholesterol Cal: 22 mg/dL (ref 5–40)

## 2022-12-12 LAB — PSA: Prostate Specific Ag, Serum: 4.1 ng/mL — ABNORMAL HIGH (ref 0.0–4.0)

## 2022-12-26 ENCOUNTER — Ambulatory Visit: Payer: No Typology Code available for payment source | Admitting: Internal Medicine

## 2023-01-08 ENCOUNTER — Other Ambulatory Visit: Payer: Self-pay

## 2023-01-08 DIAGNOSIS — J301 Allergic rhinitis due to pollen: Secondary | ICD-10-CM

## 2023-02-10 ENCOUNTER — Other Ambulatory Visit: Payer: Self-pay | Admitting: Internal Medicine

## 2023-02-10 DIAGNOSIS — J301 Allergic rhinitis due to pollen: Secondary | ICD-10-CM

## 2023-03-01 ENCOUNTER — Ambulatory Visit: Payer: No Typology Code available for payment source | Admitting: Cardiovascular Disease

## 2023-03-01 ENCOUNTER — Other Ambulatory Visit: Payer: Self-pay | Admitting: Internal Medicine

## 2023-03-01 DIAGNOSIS — J301 Allergic rhinitis due to pollen: Secondary | ICD-10-CM

## 2023-05-15 ENCOUNTER — Other Ambulatory Visit: Payer: Self-pay | Admitting: Internal Medicine

## 2023-05-15 DIAGNOSIS — I4891 Unspecified atrial fibrillation: Secondary | ICD-10-CM

## 2023-05-27 ENCOUNTER — Other Ambulatory Visit: Payer: Self-pay | Admitting: Internal Medicine

## 2023-05-27 ENCOUNTER — Other Ambulatory Visit: Payer: Self-pay | Admitting: Cardiovascular Disease

## 2023-05-27 DIAGNOSIS — I2510 Atherosclerotic heart disease of native coronary artery without angina pectoris: Secondary | ICD-10-CM

## 2023-05-27 DIAGNOSIS — I4891 Unspecified atrial fibrillation: Secondary | ICD-10-CM

## 2023-06-02 ENCOUNTER — Other Ambulatory Visit: Payer: Self-pay | Admitting: Internal Medicine

## 2023-06-02 DIAGNOSIS — I4891 Unspecified atrial fibrillation: Secondary | ICD-10-CM

## 2023-06-22 ENCOUNTER — Ambulatory Visit: Admitting: Internal Medicine

## 2023-07-01 ENCOUNTER — Other Ambulatory Visit: Payer: Self-pay | Admitting: Internal Medicine

## 2023-07-01 DIAGNOSIS — J301 Allergic rhinitis due to pollen: Secondary | ICD-10-CM

## 2023-07-10 ENCOUNTER — Ambulatory Visit (INDEPENDENT_AMBULATORY_CARE_PROVIDER_SITE_OTHER): Admitting: Internal Medicine

## 2023-07-10 ENCOUNTER — Encounter: Payer: Self-pay | Admitting: Internal Medicine

## 2023-07-10 VITALS — BP 152/81 | HR 68 | Temp 97.5°F | Ht 74.0 in | Wt 192.5 lb

## 2023-07-10 DIAGNOSIS — R634 Abnormal weight loss: Secondary | ICD-10-CM | POA: Diagnosis not present

## 2023-07-10 DIAGNOSIS — I10 Essential (primary) hypertension: Secondary | ICD-10-CM | POA: Diagnosis not present

## 2023-07-10 DIAGNOSIS — J301 Allergic rhinitis due to pollen: Secondary | ICD-10-CM | POA: Diagnosis not present

## 2023-07-10 DIAGNOSIS — J441 Chronic obstructive pulmonary disease with (acute) exacerbation: Secondary | ICD-10-CM

## 2023-07-10 MED ORDER — FLUOXETINE HCL 20 MG PO CAPS
40.0000 mg | ORAL_CAPSULE | Freq: Every day | ORAL | 0 refills | Status: DC
Start: 2023-07-10 — End: 2023-10-29

## 2023-07-10 NOTE — Progress Notes (Signed)
 Established Patient Office Visit  Subjective:  Patient ID: Gregory Lane, male    DOB: 1950-04-14  Age: 73 y.o. MRN: [id number]  Chief Complaint  Patient presents with   Results    2 week lab result     C/o depression after losing his sister. Also here for lab review and medication refills. Failed to have previsit labs done.     No other concerns at this time.   Past Medical History:  Diagnosis Date   Anxiety    Aortic atherosclerosis (HCC) 02/16/2017   Arthritis    Blind left eye    COPD (chronic obstructive pulmonary disease) (HCC)    Depression    Dysrhythmia    A-fib   GERD (gastroesophageal reflux disease)    Hypertension    Low TSH level 02/17/2017   Myocardial infarction [facility])    PAD (peripheral artery disease) (HCC)    Paroxysmal atrial fibrillation (HCC)    Perforated duodenal ulcer (HCC)    Stroke (HCC)    left side is weak    Past Surgical History:  Procedure Laterality Date   BRONCHIAL BIOPSY  03/22/2022   Procedure: BRONCHIAL BIOPSIES;  Surgeon: Vergia Glasgow, MD;  [facility];  Service: Pulmonary;;   BRONCHIAL NEEDLE ASPIRATION BIOPSY  03/22/2022   Procedure: BRONCHIAL NEEDLE ASPIRATION BIOPSIES;  Surgeon: Vergia Glasgow, MD;  Location: [facility];  Service: Pulmonary;;   ENDOBRONCHIAL ULTRASOUND  03/22/2022   Procedure: ENDOBRONCHIAL ULTRASOUND;  Surgeon: Vergia Glasgow, MD;  Location: [facility];  Service: Pulmonary;;   EXPLORATORY LAPAROTOMY  05/12/2015   EYE SURGERY     FRACTURE SURGERY     LAPAROTOMY N/A 05/10/2015   Procedure: EXPLORATORY LAPAROTOMY;  Surgeon: Shela Derby, MD;  Location: [facility] OR;  Service: General;  Laterality: N/A;   LEFT HEART CATH AND CORONARY ANGIOGRAPHY N/A 05/02/2016   Procedure: Left Heart Cath and Coronary Angiography;  Surgeon: Knox Perl, MD;  Location: [facility] INVASIVE CV LAB;  Service: Cardiovascular;  Laterality: N/A;   LOWER EXTREMITY ANGIOGRAPHY Left 09/21/2021   Procedure: Lower Extremity  Angiography;  Surgeon: Jackquelyn Mass, MD;  Location: [facility] INVASIVE CV LAB;  Service: Cardiovascular;  Laterality: Left;    Social History   Socioeconomic History   Marital status: Single    Spouse name: Not on file   Number of children: Not on file   Years of education: Not on file   Highest education level: Not on file  Occupational History   Not on file  Tobacco Use   Smoking status: Every Day    Current packs/day: 0.50    Average packs/day: 0.5 packs/day for 65.0 years (32.5 ttl pk-yrs)    Types: Cigarettes   Smokeless tobacco: Never   Tobacco comments:    0.5 PPD  Vaping Use   Vaping status: Never Used  Substance and Sexual Activity   Alcohol use: Yes    Alcohol/week: 60.0 standard drinks of alcohol    Types: 60 Standard drinks or equivalent per week    Comment: daily 12 pk of beer   Drug use: No   Sexual activity: Not Currently  Other Topics Concern   Not on file  Social History Narrative   [place] Veteran. Lives with sister and sister-in-law   Social Drivers of Health   Financial Resource Strain: Low Risk  (01/23/2022)   Overall Financial Resource Strain (CARDIA)    Difficulty of Paying Living Expenses: Not very hard  Food Insecurity: Food Insecurity Present (03/28/2022)   Hunger Vital  Sign    Worried About Programme researcher, broadcasting/film/video in the Last Year: Often true    Ran Out of Food in the Last Year: Often true  Transportation Needs: No Transportation Needs (03/28/2022)   PRAPARE - Administrator, Civil Service (Medical): No    Lack of Transportation (Non-Medical): No  Physical Activity: Inactive (01/23/2022)   Exercise Vital Sign    Days of Exercise per Week: 0 days    Minutes of Exercise per Session: 0 min  Stress: No Stress Concern Present (09/09/2021)   [employer] of Occupational Health - Occupational Stress Questionnaire    Feeling of Stress : Only a little  Social Connections: Socially Isolated (09/09/2021)   Social Connection and Isolation  Panel [NHANES]    Frequency of Communication with Friends and Family: More than three times a week    Frequency of Social Gatherings with Friends and Family: More than three times a week    Attends Religious Services: Never    Database administrator or Organizations: No    Attends Banker Meetings: Never    Marital Status: Never married  Intimate Partner Violence: Not At Risk (03/28/2022)   Humiliation, Afraid, Rape, and Kick questionnaire    Fear of Current or Ex-Partner: No    Emotionally Abused: No    Physically Abused: No    Sexually Abused: No    Family History  Problem Relation Age of Onset   Hypertension Mother    Dementia Mother    Cirrhosis Father    Cancer Father    Lung cancer Sister    Hypertension Sister    Arthritis Sister    Heart murmur Sister    Arrhythmia Sister     No Known Allergies  Outpatient Medications Prior to Visit  Medication Sig   atorvastatin  (LIPITOR) 40 MG tablet TAKE 1 TABLET BY MOUTH EVERY DAY   losartan  (COZAAR ) 50 MG tablet Take 1 tablet (50 mg total) by mouth 2 (two) times daily.   pantoprazole  (PROTONIX ) 40 MG tablet Take 1 tablet (40 mg total) by mouth 2 (two) times daily.   warfarin (COUMADIN ) 4 MG tablet Take 1 tablet (4 mg total) by mouth daily. Take 1 daily Monday  through Friday. Do not take Saturday and Sunday.Take 4 mg by mouth daily.   [DISCONTINUED] FLUoxetine  (PROZAC ) 20 MG capsule TAKE 2 CAPSULES BY MOUTH EVERY DAY   aspirin  81 MG chewable tablet Chew 81 mg by mouth once. (Patient not taking: Reported on 07/10/2023)   cetirizine  (ZYRTEC  ALLERGY) 10 MG tablet Take 1 tablet (10 mg total) by mouth daily.   clotrimazole -betamethasone  (LOTRISONE ) cream Apply 1 Application topically 2 (two) times daily. (Patient not taking: Reported on 07/10/2023)   fluticasone  (FLONASE ) 50 MCG/ACT nasal spray Place 1 spray into both nostrils daily.   furosemide  (LASIX ) 20 MG tablet Take 20 mg by mouth daily. (Patient not taking: Reported on  07/10/2023)   KLOR-CON  M20 20 MEQ tablet Take 20 mEq by mouth 2 (two) times daily. (Patient not taking: Reported on 07/10/2023)   No facility-administered medications prior to visit.    Review of Systems  Constitutional:  Positive for weight loss (25 lbs).  HENT: Negative.    Eyes: Negative.   Respiratory: Negative.    Cardiovascular: Negative.   Gastrointestinal: Negative.   Genitourinary: Negative.   Skin:  Positive for itching and rash.  Neurological: Negative.   Endo/Heme/Allergies: Negative.        Objective:   BP Aaron Aas)  152/81   Pulse 68   Temp (!) 97.5 F (36.4 C)   Ht 6\' 2"  (1.88 m)   Wt 192 lb 8 oz (87.3 kg)   SpO2 96%   BMI 24.72 kg/m   Vitals:   07/10/23 1536  BP: (!) 152/81  Pulse: 68  Temp: (!) 97.5 F (36.4 C)  Height: 6\' 2"  (1.88 m)  Weight: 192 lb 8 oz (87.3 kg)  SpO2: 96%  BMI (Calculated): 24.7    Physical Exam Vitals reviewed.  Constitutional:      Appearance: Normal appearance.  HENT:     Head: Normocephalic.     Left Ear: There is no impacted cerumen.     Nose: Nose normal.     Mouth/Throat:     Mouth: Mucous membranes are moist.     Pharynx: No posterior oropharyngeal erythema.  Eyes:     Extraocular Movements: Extraocular movements intact.     Pupils: Pupils are equal, round, and reactive to light.  Cardiovascular:     Rate and Rhythm: Regular rhythm.     Chest Wall: PMI is not displaced.     Pulses: Normal pulses.     Heart sounds: Normal heart sounds. No murmur heard. Pulmonary:     Effort: Pulmonary effort is normal.     Breath sounds: Normal air entry. No rhonchi or rales.  Abdominal:     General: Abdomen is flat. Bowel sounds are normal. There is no distension.     Palpations: Abdomen is soft. There is no hepatomegaly, splenomegaly or mass.     Tenderness: There is no abdominal tenderness.  Musculoskeletal:        General: Normal range of motion.     Cervical back: Normal range of motion and neck supple.     Right lower  leg: No edema.     Left lower leg: No edema.  Skin:    General: Skin is warm and dry.  Neurological:     General: No focal deficit present.     Mental Status: He is alert and oriented to person, place, and time.     Cranial Nerves: No cranial nerve deficit.     Motor: No weakness.  Psychiatric:        Mood and Affect: Mood normal.        Behavior: Behavior normal.      No results found for any visits on 07/10/23.  No results found for this or any previous visit (from the past 2160 hours).    Assessment & Plan:  As per problem list  Problem List Items Addressed This Visit       Cardiovascular and Mediastinum   Essential hypertension     Respiratory   COPD with acute exacerbation (HCC)   Allergic rhinitis due to pollen   Relevant Medications   FLUoxetine  (PROZAC ) 20 MG capsule   Other Visit Diagnoses       Abnormal weight loss    -  Primary   Relevant Orders   POC Hemoccult Bld/Stl (1-Cd Office Dx)   DG Chest 2 View   PSA   CA 125   CEA   CBC With Diff/Platelet   Comprehensive metabolic panel with GFR       Return in about 2 weeks (around 07/24/2023) for fu with labs prior, Imaging results.   Total time spent: 20 minutes  Arzella Bitters, MD  07/10/2023   This document may have been prepared by [facility] Voice Recognition software and as such  may include unintentional dictation errors.

## 2023-07-11 LAB — CBC WITH DIFF/PLATELET
Basophils Absolute: 0.1 10*3/uL (ref 0.0–0.2)
Basos: 1 %
EOS (ABSOLUTE): 0.2 10*3/uL (ref 0.0–0.4)
Eos: 3 %
Hematocrit: 41 % (ref 37.5–51.0)
Hemoglobin: 14.5 g/dL (ref 13.0–17.7)
Immature Grans (Abs): 0 10*3/uL (ref 0.0–0.1)
Immature Granulocytes: 0 %
Lymphocytes Absolute: 1 10*3/uL (ref 0.7–3.1)
Lymphs: 17 %
MCH: 35.8 pg — ABNORMAL HIGH (ref 26.6–33.0)
MCHC: 35.4 g/dL (ref 31.5–35.7)
MCV: 101 fL — ABNORMAL HIGH (ref 79–97)
Monocytes Absolute: 0.4 10*3/uL (ref 0.1–0.9)
Monocytes: 8 %
Neutrophils Absolute: 3.9 10*3/uL (ref 1.4–7.0)
Neutrophils: 71 %
Platelets: 224 10*3/uL (ref 150–450)
RBC: 4.05 x10E6/uL — ABNORMAL LOW (ref 4.14–5.80)
RDW: 12.9 % (ref 11.6–15.4)
WBC: 5.6 10*3/uL (ref 3.4–10.8)

## 2023-07-11 LAB — COMPREHENSIVE METABOLIC PANEL WITH GFR
ALT: 15 IU/L (ref 0–44)
AST: 12 IU/L (ref 0–40)
Albumin: 3.9 g/dL (ref 3.8–4.8)
Alkaline Phosphatase: 146 IU/L — ABNORMAL HIGH (ref 44–121)
BUN/Creatinine Ratio: 12 (ref 10–24)
BUN: 8 mg/dL (ref 8–27)
Bilirubin Total: 0.3 mg/dL (ref 0.0–1.2)
CO2: 19 mmol/L — ABNORMAL LOW (ref 20–29)
Calcium: 8.7 mg/dL (ref 8.6–10.2)
Chloride: 101 mmol/L (ref 96–106)
Creatinine, Ser: 0.69 mg/dL — ABNORMAL LOW (ref 0.76–1.27)
Globulin, Total: 2.6 g/dL (ref 1.5–4.5)
Glucose: 85 mg/dL (ref 70–99)
Potassium: 4 mmol/L (ref 3.5–5.2)
Sodium: 136 mmol/L (ref 134–144)
Total Protein: 6.5 g/dL (ref 6.0–8.5)
eGFR: 97 mL/min/{1.73_m2} (ref >59)

## 2023-07-11 LAB — CEA: CEA: 2.4 ng/mL (ref 0.0–4.7)

## 2023-07-11 LAB — CA 125: Cancer Antigen (CA) 125: 12 U/mL

## 2023-07-11 LAB — PSA: Prostate Specific Ag, Serum: 5.1 ng/mL — ABNORMAL HIGH (ref 0.0–4.0)

## 2023-07-27 ENCOUNTER — Ambulatory Visit
Admission: RE | Admit: 2023-07-27 | Discharge: 2023-07-27 | Disposition: A | Discharge status: Home / Self Care | Attending: Internal Medicine | Admitting: Internal Medicine

## 2023-07-27 ENCOUNTER — Ambulatory Visit
Admission: RE | Admit: 2023-07-27 | Discharge: 2023-07-27 | Disposition: A | Discharge status: Home / Self Care | Source: Ambulatory Visit | Attending: Internal Medicine | Admitting: Internal Medicine

## 2023-07-27 DIAGNOSIS — R634 Abnormal weight loss: Secondary | ICD-10-CM | POA: Diagnosis present

## 2023-07-30 ENCOUNTER — Encounter: Payer: Self-pay | Admitting: Internal Medicine

## 2023-07-30 ENCOUNTER — Ambulatory Visit: Admitting: Internal Medicine

## 2023-07-30 ENCOUNTER — Other Ambulatory Visit: Payer: Self-pay

## 2023-07-30 ENCOUNTER — Ambulatory Visit: Payer: Self-pay | Admitting: Internal Medicine

## 2023-07-30 VITALS — BP 138/62 | HR 53 | Temp 98.0°F | Ht 73.0 in | Wt 191.2 lb

## 2023-07-30 DIAGNOSIS — R634 Abnormal weight loss: Secondary | ICD-10-CM

## 2023-07-30 DIAGNOSIS — S43402A Unspecified sprain of left shoulder joint, initial encounter: Secondary | ICD-10-CM

## 2023-07-30 DIAGNOSIS — R972 Elevated prostate specific antigen [PSA]: Secondary | ICD-10-CM | POA: Diagnosis not present

## 2023-07-30 DIAGNOSIS — K219 Gastro-esophageal reflux disease without esophagitis: Secondary | ICD-10-CM

## 2023-07-30 DIAGNOSIS — Z0130 Encounter for examination of blood pressure without abnormal findings: Secondary | ICD-10-CM

## 2023-07-30 DIAGNOSIS — F17210 Nicotine dependence, cigarettes, uncomplicated: Secondary | ICD-10-CM | POA: Diagnosis not present

## 2023-07-30 MED ORDER — PANTOPRAZOLE SODIUM 40 MG PO TBEC: 40.0000 mg | DELAYED_RELEASE_TABLET | Freq: Two times a day (BID) | ORAL | 1 refills | Status: DC

## 2023-07-30 MED ORDER — CELECOXIB 200 MG PO CAPS: 200.0000 mg | ORAL_CAPSULE | Freq: Two times a day (BID) | ORAL | 2 refills | Status: DC

## 2023-07-30 MED ORDER — IBUPROFEN 800 MG PO TABS: 800.0000 mg | ORAL_TABLET | Freq: Three times a day (TID) | ORAL | 0 refills | Status: DC | PRN

## 2023-07-30 NOTE — Progress Notes (Signed)
 Established Patient Office Visit  Subjective:  Patient ID: Gregory Lane, male    DOB: 05/23/1950  Age: 73 y.o. MRN: [id number]  Chief Complaint  Patient presents with   Follow-up    2 week lab results & xray results    C/o fall due to his "legs giving way" landed on his left side and c/o pain and impaired mobility of his left shoulder.    No other concerns at this time.   Past Medical History:  Diagnosis Date   Anxiety    Aortic atherosclerosis (HCC) 02/16/2017   Arthritis    Blind left eye    COPD (chronic obstructive pulmonary disease) (HCC)    Depression    Dysrhythmia    A-fib   GERD (gastroesophageal reflux disease)    Hypertension    Low TSH level 02/17/2017   Myocardial infarction [facility])    PAD (peripheral artery disease) (HCC)    Paroxysmal atrial fibrillation (HCC)    Perforated duodenal ulcer (HCC)    Stroke (HCC)    left side is weak    Past Surgical History:  Procedure Laterality Date   BRONCHIAL BIOPSY  03/22/2022   Procedure: BRONCHIAL BIOPSIES;  Surgeon: Vergia Glasgow, MD;  Location: [facility] ENDOSCOPY;  Service: Pulmonary;;   BRONCHIAL NEEDLE ASPIRATION BIOPSY  03/22/2022   Procedure: BRONCHIAL NEEDLE ASPIRATION BIOPSIES;  Surgeon: Vergia Glasgow, MD;  Location: [facility] ENDOS[facility];  Service: Pulmonary;;   ENDOBRONCHIAL ULTRASOUND  03/22/2022   Procedure: ENDOBRONCHIAL ULTRASOUND;  Surgeon: Vergia Glasgow, MD;  Location: [facility] ENDOS[facility];  Service: Pulmonary;;   EXPLORATORY LAPAROTOMY  05/12/2015   EYE SURGERY     FRACTURE SURGERY     LAPAROTOMY N/A 05/10/2015   Procedure: EXPLORATORY LAPAROTOMY;  Surgeon: Shela Derby, MD;  Location: [facility] OR;  Service: General;  Laterality: N/A;   LEFT HEART CATH AND CORONARY ANGIOGRAPHY N/A 05/02/2016   Procedure: Left Heart Cath and Coronary Angiography;  Surgeon: Knox Perl, MD;  Location: [facility] INVASIVE CV LAB;  Service: Cardiovascular;  Laterality: N/A;   LOWER EXTREMITY ANGIOGRAPHY Left 09/21/2021   Procedure: Lower Extremity  Angiography;  Surgeon: Jackquelyn Mass, MD;  Location: [facility] INVASIVE CV LAB;  Service: Cardiovascular;  Laterality: Left;    Social History   Socioeconomic History   Marital status: Single    Spouse name: Not on file   Number of children: Not on file   Years of education: Not on file   Highest education level: Not on file  Occupational History   Not on file  Tobacco Use   Smoking status: Every Day    Current packs/day: 0.50    Average packs/day: 0.5 packs/day for 65.0 years (32.5 ttl pk-yrs)    Types: Cigarettes   Smokeless tobacco: Never   Tobacco comments:    0.5 PPD  Vaping Use   Vaping status: Never Used  Substance and Sexual Activity   Alcohol use: Yes    Alcohol/week: 60.0 standard drinks of alcohol    Types: 60 Standard drinks or equivalent per week    Comment: daily 12 pk of beer   Drug use: No   Sexual activity: Not Currently  Other Topics Concern   Not on file  Social History Narrative   [place] Veteran. Lives with sister and sister-in-law   Social Drivers of Health   Financial Resource Strain: Low Risk  (01/23/2022)   Overall Financial Resource Strain (CARDIA)    Difficulty of Paying Living Expenses: Not very hard  Food Insecurity: Food Insecurity Present (03/28/2022)  Hunger Vital Sign    Worried About Running Out of Food in the Last Year: Often true    Ran Out of Food in the Last Year: Often true  Transportation Needs: No Transportation Needs (03/28/2022)   PRAPARE - Administrator, Civil Service (Medical): No    Lack of Transportation (Non-Medical): No  Physical Activity: Inactive (01/23/2022)   Exercise Vital Sign    Days of Exercise per Week: 0 days    Minutes of Exercise per Session: 0 min  Stress: No Stress Concern Present (09/09/2021)   [employer] of Occupational Health - Occupational Stress Questionnaire    Feeling of Stress : Only a little  Social Connections: Socially Isolated (09/09/2021)   Social Connection and Isolation  Panel [NHANES]    Frequency of Communication with Friends and Family: More than three times a week    Frequency of Social Gatherings with Friends and Family: More than three times a week    Attends Religious Services: Never    Database administrator or Organizations: No    Attends Banker Meetings: Never    Marital Status: Never married  Intimate Partner Violence: Not At Risk (03/28/2022)   Humiliation, Afraid, Rape, and Kick questionnaire    Fear of Current or Ex-Partner: No    Emotionally Abused: No    Physically Abused: No    Sexually Abused: No    Family History  Problem Relation Age of Onset   Hypertension Mother    Dementia Mother    Cirrhosis Father    Cancer Father    Lung cancer Sister    Hypertension Sister    Arthritis Sister    Heart murmur Sister    Arrhythmia Sister     No Known Allergies  Outpatient Medications Prior to Visit  Medication Sig   aspirin  81 MG chewable tablet Chew 81 mg by mouth once. (Patient not taking: Reported on 07/10/2023)   atorvastatin  (LIPITOR) 40 MG tablet TAKE 1 TABLET BY MOUTH EVERY DAY   cetirizine  (ZYRTEC  ALLERGY) 10 MG tablet Take 1 tablet (10 mg total) by mouth daily.   clotrimazole -betamethasone  (LOTRISONE ) cream Apply 1 Application topically 2 (two) times daily. (Patient not taking: Reported on 07/10/2023)   FLUoxetine  (PROZAC ) 20 MG capsule Take 2 capsules (40 mg total) by mouth daily.   fluticasone  (FLONASE ) 50 MCG/ACT nasal spray Place 1 spray into both nostrils daily.   furosemide  (LASIX ) 20 MG tablet Take 20 mg by mouth daily. (Patient not taking: Reported on 07/10/2023)   KLOR-CON  M20 20 MEQ tablet Take 20 mEq by mouth 2 (two) times daily. (Patient not taking: Reported on 07/10/2023)   losartan  (COZAAR ) 50 MG tablet Take 1 tablet (50 mg total) by mouth 2 (two) times daily.   pantoprazole  (PROTONIX ) 40 MG tablet Take 1 tablet (40 mg total) by mouth 2 (two) times daily.   warfarin (COUMADIN ) 4 MG tablet Take 1 tablet  (4 mg total) by mouth daily. Take 1 daily Monday  through Friday. Do not take Saturday and Sunday.Take 4 mg by mouth daily.   No facility-administered medications prior to visit.    Review of Systems  Constitutional:  Positive for weight loss (1lbs).  HENT: Negative.    Eyes: Negative.   Respiratory: Negative.    Cardiovascular: Negative.   Gastrointestinal: Negative.   Genitourinary: Negative.   Skin:  Positive for itching and rash.  Neurological: Negative.   Endo/Heme/Allergies: Negative.        Objective:  BP 138/62   Pulse (!) 53   Temp 98 F (36.7 C)   Ht 6\' 1"  (1.854 m)   Wt 191 lb 3.2 oz (86.7 kg)   SpO2 95%   BMI 25.23 kg/m   Vitals:   07/30/23 1016  BP: 138/62  Pulse: (!) 53  Temp: 98 F (36.7 C)  Height: 6\' 1"  (1.854 m)  Weight: 191 lb 3.2 oz (86.7 kg)  SpO2: 95%  BMI (Calculated): 25.23    Physical Exam Vitals reviewed.  Constitutional:      Appearance: Normal appearance.  HENT:     Head: Normocephalic.     Left Ear: There is no impacted cerumen.     Nose: Nose normal.     Mouth/Throat:     Mouth: Mucous membranes are moist.     Pharynx: No posterior oropharyngeal erythema.  Eyes:     Extraocular Movements: Extraocular movements intact.     Pupils: Pupils are equal, round, and reactive to light.  Cardiovascular:     Rate and Rhythm: Regular rhythm.     Chest Wall: PMI is not displaced.     Pulses: Normal pulses.     Heart sounds: Normal heart sounds. No murmur heard. Pulmonary:     Effort: Pulmonary effort is normal.     Breath sounds: Normal air entry. No rhonchi or rales.  Abdominal:     General: Abdomen is flat. Bowel sounds are normal. There is no distension.     Palpations: Abdomen is soft. There is no hepatomegaly, splenomegaly or mass.     Tenderness: There is no abdominal tenderness.  Musculoskeletal:     Left shoulder: Decreased range of motion. Decreased strength.     Cervical back: Normal range of motion and neck supple.      Right lower leg: No edema.     Left lower leg: No edema.  Skin:    General: Skin is warm and dry.  Neurological:     General: No focal deficit present.     Mental Status: He is alert and oriented to person, place, and time.     Cranial Nerves: No cranial nerve deficit.     Motor: No weakness.  Psychiatric:        Mood and Affect: Mood normal.        Behavior: Behavior normal.      No results found for any visits on 07/30/23.  Recent Results (from the past 2160 hours)  PSA     Status: Abnormal   Collection Time: 07/10/23  4:08 PM  Result Value Ref Range   Prostate Specific Ag, Serum 5.1 (H) 0.0 - 4.0 ng/mL    Comment: Roche ECLIA methodology. According to the American Urological Association, Serum PSA should decrease and remain at undetectable levels after radical prostatectomy. The AUA defines biochemical recurrence as an initial PSA value 0.2 ng/mL or greater followed by a subsequent confirmatory PSA value 0.2 ng/mL or greater. Values obtained with different assay methods or kits cannot be used interchangeably. Results cannot be interpreted as absolute evidence of the presence or absence of malignant disease.   CA 125     Status: None   Collection Time: 07/10/23  4:08 PM  Result Value Ref Range   Cancer Antigen (CA) 125 12.0 Not Estab. U/mL    Comment: Roche [employer] Electrochemiluminescence Immunoassay (ECLIA) Values obtained with different assay methods or kits cannot be used interchangeably.  Results cannot be interpreted as absolute evidence of the presence or absence of  malignant disease.   CEA     Status: None   Collection Time: 07/10/23  4:08 PM  Result Value Ref Range   CEA 2.4 0.0 - 4.7 ng/mL    Comment:                              Nonsmokers          <3.9                              Smokers             <5.6 [employer] Electrochemiluminescence Immunoassay (ECLIA) Values obtained with different assay methods or kits cannot be used  interchangeably.  Results cannot be interpreted as absolute evidence of the presence or absence of malignant disease.   CBC With Diff/Platelet     Status: Abnormal   Collection Time: 07/10/23  4:08 PM  Result Value Ref Range   WBC 5.6 3.4 - 10.8 x10E3/uL   RBC 4.05 (L) 4.14 - 5.80 x10E6/uL   Hemoglobin 14.5 13.0 - 17.7 g/dL   Hematocrit 78.2 95.6 - 51.0 %   MCV 101 (H) 79 - 97 fL   MCH 35.8 (H) 26.6 - 33.0 pg   MCHC 35.4 31.5 - 35.7 g/dL   RDW 21.3 08.6 - 57.8 %   Platelets 224 150 - 450 x10E3/uL   Neutrophils 71 Not Estab. %   Lymphs 17 Not Estab. %   Monocytes 8 Not Estab. %   Eos 3 Not Estab. %   Basos 1 Not Estab. %   Neutrophils Absolute 3.9 1.4 - 7.0 x10E3/uL   Lymphocytes Absolute 1.0 0.7 - 3.1 x10E3/uL   Monocytes Absolute 0.4 0.1 - 0.9 x10E3/uL   EOS (ABSOLUTE) 0.2 0.0 - 0.4 x10E3/uL   Basophils Absolute 0.1 0.0 - 0.2 x10E3/uL   Immature Granulocytes 0 Not Estab. %   Immature Grans (Abs) 0.0 0.0 - 0.1 x10E3/uL  Comprehensive metabolic panel with GFR     Status: Abnormal   Collection Time: 07/10/23  4:08 PM  Result Value Ref Range   Glucose 85 70 - 99 mg/dL   BUN 8 8 - 27 mg/dL   Creatinine, Ser 4.69 (L) 0.76 - 1.27 mg/dL   eGFR 97 >62 XB/MWU/1.32   BUN/Creatinine Ratio 12 10 - 24   Sodium 136 134 - 144 mmol/L   Potassium 4.0 3.5 - 5.2 mmol/L   Chloride 101 96 - 106 mmol/L   CO2 19 (L) 20 - 29 mmol/L   Calcium  8.7 8.6 - 10.2 mg/dL   Total Protein 6.5 6.0 - 8.5 g/dL   Albumin 3.9 3.8 - 4.8 g/dL   Globulin, Total 2.6 1.5 - 4.5 g/dL   Bilirubin Total 0.3 0.0 - 1.2 mg/dL   Alkaline Phosphatase 146 (H) 44 - 121 IU/L   AST 12 0 - 40 IU/L   ALT 15 0 - 44 IU/L      Assessment & Plan:  As per problem list. Instructed to return hemoccult card tomorrow.  Problem List Items Addressed This Visit   None Visit Diagnoses       Elevated PSA    -  Primary   Relevant Orders   Ambulatory referral to Urology     Abnormal weight loss       Relevant Orders   Ambulatory  referral to Urology     Sprain of  left shoulder, unspecified shoulder sprain type, initial encounter       Relevant Orders   DG Shoulder Left       Return in about 2 weeks (around 08/13/2023) for lab results.   Total time spent: 30 minutes  Arzella Bitters, MD  07/30/2023   This document may have been prepared by [facility] Voice Recognition software and as such may include unintentional dictation errors.

## 2023-07-31 ENCOUNTER — Ambulatory Visit (INDEPENDENT_AMBULATORY_CARE_PROVIDER_SITE_OTHER)

## 2023-07-31 DIAGNOSIS — S43402A Unspecified sprain of left shoulder joint, initial encounter: Secondary | ICD-10-CM

## 2023-07-31 DIAGNOSIS — M25512 Pain in left shoulder: Secondary | ICD-10-CM | POA: Diagnosis not present

## 2023-08-09 ENCOUNTER — Emergency Department

## 2023-08-09 ENCOUNTER — Other Ambulatory Visit: Payer: Self-pay

## 2023-08-09 ENCOUNTER — Emergency Department
Admission: EM | Admit: 2023-08-09 | Discharge: 2023-08-10 | Disposition: A | Discharge status: Other Acute Inpatient Hospital | Attending: Emergency Medicine | Admitting: Emergency Medicine

## 2023-08-09 DIAGNOSIS — J449 Chronic obstructive pulmonary disease, unspecified: Secondary | ICD-10-CM | POA: Insufficient documentation

## 2023-08-09 DIAGNOSIS — R4701 Aphasia: Secondary | ICD-10-CM | POA: Insufficient documentation

## 2023-08-09 DIAGNOSIS — I4892 Unspecified atrial flutter: Secondary | ICD-10-CM | POA: Insufficient documentation

## 2023-08-09 DIAGNOSIS — Z7901 Long term (current) use of anticoagulants: Secondary | ICD-10-CM | POA: Insufficient documentation

## 2023-08-09 DIAGNOSIS — G8191 Hemiplegia, unspecified affecting right dominant side: Secondary | ICD-10-CM | POA: Diagnosis not present

## 2023-08-09 DIAGNOSIS — R29810 Facial weakness: Secondary | ICD-10-CM | POA: Diagnosis not present

## 2023-08-09 DIAGNOSIS — R531 Weakness: Secondary | ICD-10-CM

## 2023-08-09 DIAGNOSIS — I10 Essential (primary) hypertension: Secondary | ICD-10-CM | POA: Diagnosis not present

## 2023-08-09 DIAGNOSIS — I2510 Atherosclerotic heart disease of native coronary artery without angina pectoris: Secondary | ICD-10-CM | POA: Diagnosis not present

## 2023-08-09 DIAGNOSIS — I63512 Cerebral infarction due to unspecified occlusion or stenosis of left middle cerebral artery: Secondary | ICD-10-CM | POA: Insufficient documentation

## 2023-08-09 LAB — DIFFERENTIAL
Abs Immature Granulocytes: 0.02 10*3/uL (ref 0.00–0.07)
Basophils Absolute: 0 10*3/uL (ref 0.0–0.1)
Basophils Relative: 1 %
Eosinophils Absolute: 0.3 10*3/uL (ref 0.0–0.5)
Eosinophils Relative: 6 %
Immature Granulocytes: 0 %
Lymphocytes Relative: 22 %
Lymphs Abs: 1.1 10*3/uL (ref 0.7–4.0)
Monocytes Absolute: 0.5 10*3/uL (ref 0.1–1.0)
Monocytes Relative: 10 %
Neutro Abs: 2.9 10*3/uL (ref 1.7–7.7)
Neutrophils Relative %: 61 %

## 2023-08-09 LAB — COMPREHENSIVE METABOLIC PANEL WITH GFR
ALT: 13 U/L (ref 0–44)
AST: 14 U/L — ABNORMAL LOW (ref 15–41)
Albumin: 3.3 g/dL — ABNORMAL LOW (ref 3.5–5.0)
Alkaline Phosphatase: 78 U/L (ref 38–126)
Anion gap: 4 — ABNORMAL LOW (ref 5–15)
BUN: 15 mg/dL (ref 8–23)
CO2: 23 mmol/L (ref 22–32)
Calcium: 8.4 mg/dL — ABNORMAL LOW (ref 8.9–10.3)
Chloride: 116 mmol/L — ABNORMAL HIGH (ref 98–111)
Creatinine, Ser: 0.83 mg/dL (ref 0.61–1.24)
GFR, Estimated: >60 mL/min (ref >60)
Glucose, Bld: 113 mg/dL — ABNORMAL HIGH (ref 70–99)
Potassium: 4.1 mmol/L (ref 3.5–5.1)
Sodium: 143 mmol/L (ref 135–145)
Total Bilirubin: 0.6 mg/dL (ref 0.0–1.2)
Total Protein: 6.5 g/dL (ref 6.5–8.1)

## 2023-08-09 LAB — CBC
HCT: 40 % (ref 39.0–52.0)
Hemoglobin: 13.5 g/dL (ref 13.0–17.0)
MCH: 35.2 pg — ABNORMAL HIGH (ref 26.0–34.0)
MCHC: 33.8 g/dL (ref 30.0–36.0)
MCV: 104.4 fL — ABNORMAL HIGH (ref 80.0–100.0)
Platelets: 173 10*3/uL (ref 150–400)
RBC: 3.83 MIL/uL — ABNORMAL LOW (ref 4.22–5.81)
RDW: 14.1 % (ref 11.5–15.5)
WBC: 4.8 10*3/uL (ref 4.0–10.5)
nRBC: 0 % (ref 0.0–0.2)

## 2023-08-09 LAB — APTT: aPTT: 27 s (ref 24–36)

## 2023-08-09 LAB — CBG MONITORING, ED: Glucose-Capillary: 117 mg/dL — ABNORMAL HIGH (ref 70–99)

## 2023-08-09 LAB — PROTIME-INR
INR: 1.1 (ref 0.8–1.2)
Prothrombin Time: 14 s (ref 11.4–15.2)

## 2023-08-09 LAB — ETHANOL: Alcohol, Ethyl (B): <15 mg/dL (ref <15)

## 2023-08-09 MED ORDER — SODIUM CHLORIDE 0.9% FLUSH: 3.0000 mL | Freq: Once | INTRAVENOUS | Status: DC

## 2023-08-09 MED ORDER — IOHEXOL 350 MG/ML SOLN
100.0000 mL | Freq: Once | INTRAVENOUS | Status: AC | PRN
  Administered 2023-08-09: 100 mL via INTRAVENOUS

## 2023-08-09 NOTE — ED Triage Notes (Signed)
 Pt arrives via POV with sister for stroke like symptoms. Pt's sister states that LTKW was 1500 this afternoon. Then when she went to check on him at 1800 and pt was experiencing slurred speech, difficulty answering questions, and weakness to right hand. Reports pt wasn't able to hold fork up to eat his food. Also reports right sided facial droop. Takes Coumadin  for A-flutter and takes a baby ASA daily. Slurred speech and right sided facial droop noted in triage. Also reported to have vomited on self PTA. Teleneuro speaking with pt and pt's sister at this time at bedside.

## 2023-08-09 NOTE — ED Notes (Addendum)
 Pt taken by triage nurse R Merkle to be cleared by MD and is enroute to CT; ED secretary Glenda Niplett notified to activate code stroke

## 2023-08-09 NOTE — Progress Notes (Signed)
   08/09/23 2200  Spiritual Encounters  Type of Visit Initial  Care provided to: Pt and family  Referral source Code page  Reason for visit Code  OnCall Visit Yes  Interventions  Spiritual Care Interventions Made Established relationship of care and support;Compassionate presence  Intervention Outcomes  Outcomes Connection to spiritual care   Chaplain supported patient and family through compassionate presence. Chaplain offered family a refreshment and asked if there were something they needed.

## 2023-08-09 NOTE — ED Notes (Signed)
 Pt outside, bent over on sidewalk on knees; sister-in-law reports that she was bringing him in for a possible stroke; last seen him this morning, now with slurred speech and rt sided weakness; pt assisted to stand to sit in w/c and taken to triage; weakness noted to rt leg upon standing and pt unable to grip rt hand; expressive aphasia noted; charge nurse Ann Keto RN notified

## 2023-08-09 NOTE — ED Provider Notes (Signed)
 [facility] Provider Note    Event Date/Time   First MD Initiated Contact with Patient 08/09/23 2113     (approximate)   History   Code Stroke   HPI Gregory Lane is a 73 y.o. male with history of atrial flutter, COPD, HTN, HLD, chronically on warfarin presenting today as a stroke alert.  Patient brought in by sister for strokelike symptoms.  Reportedly had a last known well around 3 PM this afternoon.  When she checked on him around 6 PM, he was having slurred speech with difficulty answering questions and weakness to his right sided extremities.  He was unable to hold a fork up to eat his food and pop he had a right sided facial droop.  He is chronically on warfarin for his atrial flutter.  Reportedly vomited 1 time as well.  Denies any other acute symptoms.      Physical Exam   Triage Vital Signs: ED Triage Vitals  Encounter Vitals Group     BP 08/09/23 2110 (!) 194/99     Girls Systolic BP Percentile --      Girls Diastolic BP Percentile --      Boys Systolic BP Percentile --      Boys Diastolic BP Percentile --      Pulse Rate 08/09/23 2140 (!) 59     Resp 08/09/23 2140 18     Temp --      Temp src --      SpO2 08/09/23 2135 96 %     Weight --      Height --      Head Circumference --      Peak Flow --      Pain Score --      Pain Loc --      Pain Education --      Exclude from Growth Chart --     Most recent vital signs: Vitals:   08/09/23 2300 08/09/23 2321  BP: (!) 201/82   Pulse: (!) 56   Resp: 16   Temp:  97.8 F (36.6 C)  SpO2: 100%    I have reviewed the vital signs. General:  Awake, alert, no acute distress. Head:  Normocephalic, Atraumatic. EENT:  PERRL, EOMI, Oral mucosa pink and moist, Neck is supple. Cardiovascular: Regular rate, 2+ distal pulses. Respiratory:  Normal respiratory effort, symmetrical expansion, no distress.   Extremities:  Moving all four extremities through full ROM without pain.   Neuro:   Alert.  Dysarthric and nearly fully aphasic.  Notable right-sided weakness with drifting of both upper and lower extremities.  No complete paralysis.  Left-sided extremities normal.  Sensation deficits to right sided extremities compared to left side.  Right-sided facial droop present.  No obvious vision changes. Skin:  Warm, dry, no rash.   Psych: Appropriate affect.     ED Results / Procedures / Treatments   Labs (all labs ordered are listed, but only abnormal results are displayed) Labs Reviewed  CBC - Abnormal; Notable for the following components:      Result Value   RBC 3.83 (*)    MCV 104.4 (*)    MCH 35.2 (*)    All other components within normal limits  COMPREHENSIVE METABOLIC PANEL WITH GFR - Abnormal; Notable for the following components:   Chloride 116 (*)    Glucose, Bld 113 (*)    Calcium  8.4 (*)    Albumin 3.3 (*)    AST 14 (*)  Anion gap 4 (*)    All other components within normal limits  CBG MONITORING, ED - Abnormal; Notable for the following components:   Glucose-Capillary 117 (*)    All other components within normal limits  PROTIME-INR  APTT  DIFFERENTIAL  ETHANOL  CBG MONITORING, ED     EKG My EKG interpretation: Rate of 59, normal sinus rhythm, normal axis, normal intervals.  No acute ST elevations or depressions.   RADIOLOGY Independently interpreted CT head and CTA head/neck with concerns for left M2 occlusion with large penumbra and MCA territory   PROCEDURES:  Critical Care performed: Yes, see critical care procedure note(s)  .Critical Care  Performed by: Kandee Orion, MD Authorized by: Kandee Orion, MD   Critical care provider statement:    Critical care time (minutes):  45   Critical care was necessary to treat or prevent imminent or life-threatening deterioration of the following conditions: CVA.   Critical care was time spent personally by me on the following activities:  Development of treatment plan with patient or  surrogate, discussions with consultants, evaluation of patient's response to treatment, examination of patient, ordering and review of laboratory studies, ordering and review of radiographic studies, ordering and performing treatments and interventions, pulse oximetry, re-evaluation of patient's condition and review of old charts   I assumed direction of critical care for this patient from another provider in my specialty: no     Care discussed with: admitting provider      MEDICATIONS ORDERED IN ED: Medications  sodium chloride  flush (NS) 0.9 % injection 3 mL (has no administration in time range)  iohexol  (OMNIPAQUE ) 350 MG/ML injection 100 mL (100 mLs Intravenous Contrast Given 08/09/23 2150)     IMPRESSION / MDM / ASSESSMENT AND PLAN / ED COURSE  I reviewed the triage vital signs and the nursing notes.                              Differential diagnosis includes, but is not limited to, CVA, ICH, electrolyte abnormality  Patient's presentation is most consistent with acute presentation with potential threat to life or bodily function.  Patient is a 73 year old male presenting today for acute onset slurred speech and right-sided weakness.  On exam he is notably dysarthric and almost essentially aphasic as well as right-sided weakness but not full paralysis.  NIHSS of 9.  Stroke alert called as he is within the window of mechanical thrombectomy.  CT head and subsequent CTA head and neck shows left M2 occlusion with large penumbra in the region.  Teleneurology evaluated patient and not a candidate for TNK at this time given that he is on warfarin although he is subtherapeutic at this time with INR of 1.1.  Initially reached out to [facility] neuro IR for mechanical thrombectomy who consented patient's sister over the phone.  There was another patient going onto the table at the same time and they were unable to accept transfer.  I then called [facility] who accepted patient for transfer for mechanical  thrombectomy and further treatment.  Patient was signed out to oncoming provider pending arrival of transport service.  The patient is on the cardiac monitor to evaluate for evidence of arrhythmia and/or significant heart rate changes. Clinical Course as of 08/09/23 2333  Thu Aug 09, 2023  2138 Radiology - no acute CVA [DW]  2218 Radiology - possible left M2 occlusion or severe stenosis. Large area of penumbra. [  DW]  2250 Neurology at [facility] unable to accept [facility] for transfer despite neuro IR already consenting patient. Will reach out to [facility] [DW]  2329 [facility] accepting - Dr. Pati Bonine accepting [DW]    Clinical Course User Index [DW] Kandee Orion, MD     FINAL CLINICAL IMPRESSION(S) / ED DIAGNOSES   Final diagnoses:  Cerebrovascular accident (CVA) due to occlusion of left middle cerebral artery [facility])  Aphasia  Right sided weakness     Rx / DC Orders   ED Discharge Orders     None        Note:  This document was prepared using Dragon voice recognition software and may include unintentional dictation errors.   Kandee Orion, MD 08/09/23 2337

## 2023-08-09 NOTE — ED Notes (Signed)
 2116  code stroke activated.  Pt in CT.  Pt with complaints of right side weakness and slurred speech/difficulty following commands.  Brought to ED by his sister.  States LKW 1500 and ROS 1800.  mRs 0.  2128  pt returned from CT  2129  [facility] paged  2132  Dr. Arnita Laos on camera.  2148  pt back to CT for advanced imaging  2200  pt returned from CT  2235  Dr. Modena Andes informed of advanced imaging results.

## 2023-08-09 NOTE — ED Notes (Signed)
 Pt assisted with urination via urinal. Pt still having expressive aphasia, showing garbled speech.

## 2023-08-09 NOTE — Consult Note (Addendum)
 [facility] Consult Services   Patient Name:   Gregory Lane, Gregory Lane Date of Birth:   29-Feb-1950 Identification Number:   MRN - [id number] Date of Service:   08/09/2023 21:29:20  Diagnosis:       Y86.578 - Cerebrovascular accident (CVA) due to thrombosis of left middle cerebral artery (HCCC)  Impression:      73 yo male with history of HTN, HLD, CAD on ASA 81 mg, afib on warfarin, prior strokes, GERD, PAD, COPD, depression/anxiety, smoking presenting to the ED with global aphasia and Right hemiparesis concerning for acute stroke. TLKW ~1500. NIHSS 9. CT Head negative for acute hemorrhage, hyperdense Left M2. CTA/CTP pending, will follow-up. Anticipate transfer for thrombectomy evaluation. Permissive HTN.  Advanced Imaging:  CTA Head and Neck Completed.  CTP Completed.  LVO:Yes  Discussed with NIR :Yes  Initial Call Time To NIR : 08/09/2023 22:02:00  NIR Accept/Decision Time : 08/09/2023 22:24:03  Discussed with NIR Time : 08/09/2023 22:14:10  Discussed with NIR Text : Left distal M1/2 occlusion. Patient to be transferred for thrombectomy evaluation.  Of note, patient ultimately transferred to [facility] and not [facility]. Dr. Karlynn Oyster facilitated this.  Neurointerventionalist Accepted Case : Case accepted for intervention.  Our recommendations are outlined below.  Recommendations:        Stroke/Telemetry Floor       Neuro Checks (Q2)       Bedside Swallow Eval       DVT Prophylaxis       IV Fluids, Normal Saline       Head of Bed 30 Degrees       Euglycemia and Avoid Hyperthermia (PRN Acetaminophen )       Antihypertensives PRN if Blood pressure is greater than 220/120 or there is a concern for End organ damage/contraindications for permissive HTN. If blood pressure is greater than 220/120 give labetalol PO or IV or Vasotec IV with a goal of 15% reduction in BP during the first 24 hours.  Sign Out:       Discussed with Emergency Department  Provider    ------------------------------------------------------------------------------  Advanced Imaging: Advanced imaging has been ordered. Results pending.   Metrics: Last Known Well: 08/09/2023 15:00:00 Dispatch Time: 08/09/2023 21:29:20 Arrival Time: 08/09/2023 21:06:00 Initial Response Time: 08/09/2023 21:32:57 Symptoms: global aphasia, Right sided weakness. Initial patient interaction: 08/09/2023 21:35:46 NIHSS Assessment Completed: 08/09/2023 21:39:59 Patient is not a candidate for Thrombolytic. Thrombolytic Medical Decision: 08/09/2023 21:40:00 Patient was not deemed candidate for Thrombolytic because of following reasons: LKW outside 4.5 hr window. .  CT Head: CT head unremarkable for acute infarction or hemorrhage per Radiology: hyperdense Left M2  Primary Provider Notified of Diagnostic Impression and Management Plan on: 08/09/2023 21:40:52    ------------------------------------------------------------------------------  History of Present Illness: Patient is a 73 year old Male.  Patient was brought by private transportation with symptoms of global aphasia, Right sided weakness. Patient presenting from home accompanied by his sister who helps to provide the history. She last saw him normal at ~1500 when he went into his room. At ~1800 he came out of his room for dinner and it was noted he was having Right sided weakness and speech difficulty. Does have prior history of strokes, sister denies any residual deficits from this.    Past Medical History:      Hypertension      Hyperlipidemia      Atrial Fibrillation      Coronary Artery Disease      Stroke Other PMH:  GERD,  PAD, COPD, depression/anxiety  Medications:  Anticoagulant use:  Yes warfarin Antiplatelet use: Yes ASA 81 mg Reviewed EMR for current medications  Allergies:  Reviewed  Social History: Smoking: Yes Alcohol Use: Yes  Family History:  There is no family history of premature  cerebrovascular disease pertinent to this consultation  ROS : 14 Points Review of Systems was performed and was negative except mentioned in HPI.  Past Surgical History: There Is No Surgical History Contributory To Today's Visit     Examination: BP(198/78), Pulse(59), Blood Glucose(117) 1A: Level of Consciousness - Alert; keenly responsive + 0 1B: Ask Month and Age - Could Not Answer Either Question Correctly + 2 1C: Blink Eyes & Squeeze Hands - Performs 0 Tasks + 2 2: Test Horizontal Extraocular Movements - Normal + 0 3: Test Visual Fields - No Visual Loss + 0 4: Test Facial Palsy (Use Grimace if Obtunded) - Minor paralysis (flat nasolabial fold, smile asymmetry) + 1 5A: Test Left Arm Motor Drift - No Drift for 10 Seconds + 0 5B: Test Right Arm Motor Drift - Drift, but doesn't hit bed + 1 6A: Test Left Leg Motor Drift - No Drift for 5 Seconds + 0 6B: Test Right Leg Motor Drift - No Drift for 5 Seconds + 0 7: Test Limb Ataxia (FNF/Heel-Shin) - No Ataxia + 0 8: Test Sensation - Normal; No sensory loss + 0 9: Test Language/Aphasia - Severe Aphasia: Fragmentary Expression, Inference Needed, Cannot Identify Materials + 2 10: Test Dysarthria - Mild-Moderate Dysarthria: Slurring but can be understood + 1 11: Test Extinction/Inattention - No abnormality + 0  NIHSS Score: 9   Pre-Morbid Modified Rankin Scale: 1 Points = No significant disability despite symptoms; able to carry out all usual duties and activities  Spoke with : Dr. Karlynn Oyster  This consult was conducted in real time using interactive audio and Immunologist. Patient was informed of the technology being used for this visit and agreed to proceed. Patient located in hospital and provider located at home/office setting.   Patient is being evaluated for possible acute neurologic impairment and high probability of imminent or life-threatening deterioration. I spent total of 30 minutes providing care to this patient, including  time for face to face visit via telemedicine, review of medical records, imaging studies and discussion of findings with providers, the patient and/or family.   Dr Ricke Charleston   [facility] For Inpatient follow-up with [facility] physician please call [facility] at [phone]. As we are not an outpatient service for any post hospital discharge needs please contact the hospital for assistance. If you have any questions for the [facility] physicians or need to reconsult for clinical or diagnostic changes please contact us  via [facility] at [phone].

## 2023-08-09 NOTE — ED Notes (Addendum)
 Pt CT cleared by Dr. Karlynn Oyster - Pt taken to CT by Kayleen Party, RN and [place], NT via wheelchair. Stretcher and telecart taken to CT.

## 2023-08-10 ENCOUNTER — Other Ambulatory Visit: Payer: Self-pay

## 2023-08-10 NOTE — ED Notes (Signed)
 EMTALA reviewed by this RN. Consent to transfer obtained by primary RN.

## 2023-08-10 NOTE — ED Notes (Signed)
 [facility] loading pt at this time.

## 2023-08-11 NOTE — Consults (Signed)
 ------------------------------------------------------------------------------- Attestation signed by Texie Oliva Barter, MD at 08/11/23 1435 I reviewed the case with the resident.  I agree with the assessment and plan as documented in the resident's note.   -------------------------------------------------------------------------------  UROLOGY CONSULT NOTE  Requesting Attending Physician:  Shirline Balzarine, DO Service Requesting Consult:  Neurology (NEU) Service Providing Consult: SRU Consulting Attending: Dr. Oliva Bjurlin  Assessment: Patient is a 73 y.o. male with past medical history of stroke with residual left-sided weakness, blindness left eye, A-fib on warfarin, COPD, HFpEF, HTN, current smoker, ethanol abuse, RLL adenocarcinoma s/p SBRT (2024)  who  to [facility] on 08/10/2023 for thrombectomy for left MCA occlusion.   Urology consulted for traumatic foley catheter removal.   Urology placed 65F coude without difficulty. Return of clear pink urine. Flushed with minimal clots.    Recommendations: Continue foley catheter in place for at least 7 days.  Please remember to remove all 30cc from the foley balloon at time of trial of void If catheter is removed and patient fails TOV, would recommend nurse placement with 65Fr coude catheter and Lidojet (viscous lidocaine  jelly). If this fails, please page SRU for assistance and have bladder scan volume.  If the catheter stops draining, okay to flush with normal saline (60cc via a catheter tip syringe, please break seal from foley drainage bag). If unable to flush, please contact the Urology consult pager. Continue perineal pressure x 72 hours with towels compressing perineum 1 dose of ceftriaxone 2g for urethral manipulation and vigorous flushing  Please page SRU consult pager for issues/concerns  Urology will sign off.  Thank you for this consult. Please page [phone] with any questions or concerns.  History of Present Illness: Gregory Lane is seen in consultation for traumatic foley catheter removal at the request of Kostika Mulo, DO on the Neurology (NEU).   Patient unable to converse and all history gleaned from chart review.  Presented to [facility] on 08/10/23 with right sided weakness.  Diagnosed with left MCA stroke on CTA.  Underwent mechanical thrombectomy on 08/10/23. On ASA/Lovenox  Foley catheter traumatically removed at ~0830 today with return of BRB per urethra which has since stopped.  Per discussion with primary team unclear if patient had voided since foley removal.  No urologic history in chart.    Past Medical History: Past Medical History[1]  Past Surgical History:  Past Surgical History[2]  Medication: Current Medications[3]  Allergies: Allergies[4]  Social History: Short Social History[5]  Family History: Family History[6]  Review of Systems: 10 systems were reviewed and are negative except as noted specifically in the HPI.  Objective:  Intake/Output last 3 shifts: I/O last 3 completed shifts: In: 3300.1 [I.V.:1686.6; NG/GT:880; IV Piggyback:733.5] Out: 3050 [Urine:3050] Vital signs in last 24 hours: BP 157/53   Pulse 66   Temp 37 C (98.6 F) (Oral)   Resp 13   Ht 188 cm (6' 2.02)   Wt 87.2 kg (192 lb 3.9 oz)   SpO2 97%   BMI 24.67 kg/m   Physical Exam: General:  Ill appearing  HEENT: NGT in place Neck  Trachea midline, symmetrical Lungs:   Normal work of breathing on room air Cardiac: Regular rate Abdomen: Non tender, soft, non distended. GU:  Slightly edematous preputial skin, foley catheter in place draining clear pink urine Extremities: Warm and well perfused Neuro:             unable to meaningfully participate in interview   Most Recent Labs: Recent Labs    Units 08/10/23  9562 08/11/23 0444  WBC 10*9/L 7.3 7.8  RBC 10*12/L 3.85* 3.66*  HGB g/dL 86.4 87.0  HCT % 59.8 62.9*  MCV fL 104.0* 101.0*  MCH pg 35.2* 35.1*  MCHC g/dL 66.1 65.1  RDW % 85.1 85.1   PLT 10*9/L 184 192  MPV fL 8.1 8.9   Recent Labs    Units 08/10/23 0437 08/10/23 0553 08/10/23 1616 08/11/23 0444 08/11/23 0629  NA mmol/L 146* 149* 145 139  --   K mmol/L 3.2* 2.9* 3.4  --  4.5  CL mmol/L 117* 119* 114* 107  --   CO2 mmol/L 20.0 24.0 22.0 25.0  --   BUN mg/dL 9 8* 7* 10  --   CREATININE mg/dL 9.43* 9.49* 9.49* 9.33*  --   GLU mg/dL 853 869 96 886  --   MG mg/dL 1.7  --  1.9 2.2  --    Recent Labs    Units 08/10/23 0437 08/10/23 0553  ALT U/L <7* <7*  AST U/L 9 9  ALKPHOS U/L 86 79  ALBUMIN g/dL 2.9* 2.6*  PROT g/dL 5.6* 5.0*  BILITOT mg/dL 0.4 0.3  BILIDIR mg/dL 9.79  --    Recent Labs    Units 08/10/23 0437  INR  0.95  APTT sec 41.2*    Microbiology Data: No results found for: LABBLOO No results found for: LABURIN No results found for: ANACX Most recent Urinalysis: No results for input(s): LEUKOCYTESUR, NITRITE, RBCUA, WBCUA, SQUEPIU, BACTERIA in the last 168 hours.  Urinalysis History: No results found for: LEUKOCYTESUR, NITRITE, RBCUA, WBCUA, SQUEPIU, BACTERIA   Imaging: CT Head Wo Contrast Result Date: 08/11/2023 EXAM: Computed tomography, head or brain without contrast material. ACCESSION: [id number] UN   CLINICAL INDICATION: 73 years old Male with stroke eval. Patient is post thrombectomy from 08/10/2023 at 03:20   COMPARISON: CT perfusion 08/09/2023   TECHNIQUE: Axial CT images of the head from skull base to vertex without contrast.   FINDINGS: Expected evolution of left MCA territory infarct. There are foci of hyperattenuating material within the infarction bed (i.e. 6:22, 6:21). No midline shift.   Encephalomalacia of the right frontal and temporal lobe. No mass lesion. The sinuses are pneumatized. No skull fracture. Bilateral pseudophakia.       Evolution of left MCA territory infarct, with foci of hyperattenuating material within the brain parenchyma. Findings may represent retained contrast  from thrombectomy procedure or hemorrhagic transformation. Can consider repeat CT head with dual energy scanner, as clinically warranted.         ++++++++++++++++++++   The findings of this study were discussed via telephone with DR. [facility] by Dr. Donnice Alexander on 08/11/2023 10:59 AM.   -----------------------------------------------  ECG 12 Lead Result Date: 08/11/2023 NORMAL SINUS RHYTHM NORMAL ECG WHEN COMPARED WITH ECG OF 10-Aug-2023 04:21, PREMATURE VENTRICULAR BEATS ARE NO LONGER PRESENT QT HAS SHORTENED  XR Abdomen 1 View Result Date: 08/10/2023 EXAM: XR ABDOMEN 1 VIEW ACCESSION: [id number] UN REPORT DATE: 08/10/2023 1:16 PM   CLINICAL INDICATION: 73 years old with NGT (CATHETER VASCULAR FIT & ADJ)    COMPARISON: None   TECHNIQUE: Supine view of the abdomen, 1 image(s)   FINDINGS: Enteric tube with tip project over the left upper quadrant likely in the stomach. The sideport is likely at the level of the GE junction. Nonobstructive bowel gas pattern of the partially visualized abdomen. Partially seen mild colonic stool burden. Bilateral lung bases are unremarkable. No acute osseous abnormality.     Enteric tube with  tip project over the left upper quadrant likely in the stomach. The sideport is likely at the level of the GE junction. 3-5 cm advancement is recommended for better positioning. Nonobstructive bowel gas pattern of the partially visualized abdomen. Partially seen mild colonic stool burden.  Echocardiogram W Colorflow Spectral Doppler With Contrast Result Date: 08/10/2023 Patient Info Name:     AVNER STRODER Age:     53 years DOB:     08/23/1950 Gender:     Male MRN:     [id number] Accession #:     [id number] UN Account #:     [id number] Ht:     188 cm Wt:     89 kg BSA:     2.17 m2 BP:     152 /     61 mmHg Exam Date:     08/10/2023 1:55 PM Admit Date:     08/10/2023   Exam Type:     ECHOCARDIOGRAM W COLORFLOW SPECTRAL DOPPLER W CONTRAST    Technical Quality:     Fair   Staff Sonographer:     Lawayne Pa Referring Physician:     Alm Norman Dixons Reading Fellow:     Juventino JAYSON Deed MD Ordering Physician:     Wanda Lindsay   Study Info Indications      - Stroke/TIA ; Definity /Optison  Procedure(s)   Complete two-dimensional, color flow and Doppler transthoracic echocardiogram is performed with contrast to opacify the left ventricle and to improve the delineation of the left ventricle endocardial borders.   Ultrasound Enhancing Agent/Agitated Saline   ------------------------------ UEA/Ag. Saline:     Definity  Amount:     2.00 ml     Summary   1. The left ventricle is normal in size with mildly increased wall thickness.   2. The left ventricular systolic function is normal, LVEF is visually estimated at 60-65%.   3. Aortic sclerosis.   4. The left atrium is not well visualized but probably mildly dilated in size.   5. The right ventricle is normal in size, with normal systolic function.     Left Ventricle   The left ventricle is normal in size with mildly increased wall thickness. The left ventricular systolic function is normal, LVEF is visually estimated at 60-65%. There is grade I diastolic dysfunction (impaired relaxation).   Right Ventricle   The right ventricle is normal in size, with normal systolic function.     Left Atrium   The left atrium is not well visualized but probably mildly dilated in size.   Right Atrium   The right atrium is not well visualized but probably mildly dilated in size.     Aortic Valve   The aortic valve is probably trileaflet with mildly thickened leaflets with mildly reduced excursion. There is no significant aortic regurgitation. There is no evidence of a significant transvalvular gradient.   Mitral Valve   The mitral valve leaflets are normal with normal leaflet mobility. There is trivial mitral valve regurgitation.   Tricuspid Valve   The tricuspid valve leaflets are  normal, with normal leaflet mobility. There is trivial tricuspid regurgitation. The pulmonary systolic pressure cannot be estimated due to insufficient TR signal.   Pulmonic Valve   The pulmonic valve is poorly visualized, but probably normal. There is no significant pulmonic regurgitation. There is no evidence of a significant transvalvular gradient.     Aorta   The aorta is normal in size in the visualized segments.   Inferior Vena Cava  The IVC is suboptimally visualized but probably suggests normal right atrial pressure.   Pericardium/Pleural   There is no pericardial effusion.   Other Findings   Rhythm: Sinus Rhythm.     Ventricles ---------------------------------------------------------------------- Name                                 Value        Normal ----------------------------------------------------------------------   LV Dimensions 2D/MM ----------------------------------------------------------------------  IVS Diastolic Thickness (2D)                                1.1 cm       0.6-1.0 LVID Diastole (2D)                  5.2 cm       4.2-5.8  LVPW Diastolic Thickness (2D)                                1.1 cm       0.6-1.0 LVID Systole (2D)                   3.5 cm       2.5-4.0 LVOT Diameter                       2.5 cm               LV Mass Index (2D Cubed)          102 g/m2        49-115  Relative Wall Thickness (2D)                                  0.42        <=0.42   RV Dimensions 2D/MM ----------------------------------------------------------------------  RV Basal Diastolic Dimension                           4.0 cm       2.5-4.1 TAPSE                               3.0 cm         >=1.7   Atria ---------------------------------------------------------------------- Name                                 Value        Normal ----------------------------------------------------------------------   LA Dimensions  ---------------------------------------------------------------------- LA Dimension (2D)                   4.7 cm       3.0-4.1 LA Volume Index (4C A-L)        48.46 ml/m2               LA Volume Index (2C A-L)        47.42 ml/m2               LA Volume (BP MOD)                   99 ml  LA Volume Index (BP MOD)        45.45 ml/m2   16.00-34.00   RA Dimensions ---------------------------------------------------------------------- RA Area (4C)                      23.2 cm2        <=18.0 RA Area (4C) Index              10.7 cm2/m2               RA ESV Index (4C MOD)             33 ml/m2         18-32   Left Ventricular Outflow Tract ---------------------------------------------------------------------- Name                                 Value        Normal ----------------------------------------------------------------------   LVOT 2D ---------------------------------------------------------------------- LVOT Diameter                       2.5 cm               LVOT Area                          4.9 cm2                 LVOT Doppler ---------------------------------------------------------------------- LVOT Peak Velocity                 1.4 m/s               LVOT VTI                             30 cm               LVOT Stroke Volume                  145 ml               LVOT SI                           67 ml/m2   Aortic Valve ---------------------------------------------------------------------- Name                                 Value        Normal ----------------------------------------------------------------------   AV Doppler ---------------------------------------------------------------------- AV Peak Velocity                   1.8 m/s               AV Peak Gradient                   12 mmHg               AV Mean Gradient                    6 mmHg               AV VTI  39 cm               AV Area (Cont Eq VTI)              3.7 cm2         >=3.0 AV Area  Index (Cont Eq VTI)     1.7 cm2/m2               AV Area (Cont Eq Vel)              3.9 cm2               AV Area Index (Cont Eq Vel)     1.8 cm2/m2               AV DI (Vel)                           0.79               AV DI (VTI)                           0.76   Mitral Valve ---------------------------------------------------------------------- Name                                 Value        Normal ----------------------------------------------------------------------   MV Diastolic Function ---------------------------------------------------------------------- MV E Peak Velocity                 88 cm/s               MV A Peak Velocity                117 cm/s               MV E/A                                 0.8                 MV Annular TDI ---------------------------------------------------------------------- MV Septal e' Velocity             8.2 cm/s         >=8.0 MV E/e' (Septal)                      10.8               MV Lateral e' Velocity            7.5 cm/s        >=10.0 MV E/e' (Lateral)                     11.7               MV e' Average                     7.8 cm/s               MV E/e' (Average)                     11.3   Tricuspid Valve ---------------------------------------------------------------------- Name  Value        Normal ----------------------------------------------------------------------   Estimated PAP/RSVP ---------------------------------------------------------------------- RA Pressure                         3 mmHg           <=5   Pulmonic Valve ---------------------------------------------------------------------- Name                                 Value        Normal ----------------------------------------------------------------------   PV Doppler ---------------------------------------------------------------------- PV Peak Velocity                   0.9 m/s   Aorta  ---------------------------------------------------------------------- Name                                 Value        Normal ----------------------------------------------------------------------   Ascending Aorta ---------------------------------------------------------------------- Ao Root Diameter (2D)               3.3 cm               Ao Root Diam Index (2D)          1.5 cm/m2   Venous ---------------------------------------------------------------------- Name                                 Value        Normal ----------------------------------------------------------------------   IVC/SVC ---------------------------------------------------------------------- IVC Diameter (Exp 2D)               0.8 cm         <=2.1     Report Signatures Finalized by Juliene Selinda Plumb  MD on 08/10/2023 04:43 PM Resident Juventino JAYSON Deed  MD on 08/10/2023 04:35 PM  PVL Carotid Duplex Bilateral Result Date: 08/10/2023   Peripheral Vascular Lab     [address]   [place], [place] [address]  PVL CAROTID DUPLEX BILATERAL Patient Demographics Pt. Name: MCCLAIN SHALL Location: PVL Inpatient Bedside MRN:      [id number]     Sex:      Gregory Lane DOB:      Sep 19, 1950        Age:      2 years  Study Information Authorizing         [id number] [facility]      Performed Time       08/10/2023 Provider Name       RARDIN                                     8:38:01 AM Ordering Physician  Price Reusing, PA Patient Location     [facility] Accession Number    [id number] [facility]        Technologist         Dorothyann Sayre Diagnosis:                                Assisting  Technologist Ordered Reason For Exam: stroke, eval carotid stenosis  Other Indication: CVA (Left M1/M2 occlusion on CTA, ) and CTA neck: 70% right                   ICA stenosis, 60% left ICA stenosis. Risk Factors:     Hypertension, and smoking (current). Carotid Consensus Criteria  +---------------+------------+-------------------+-------------+------------+                ICA PSV cm/s  Plaque Estimate  ICA/CCA RatioICA EDV cm/s +---------------+------------+-------------------+-------------+------------+ Normal             <180           None            <2.0         <40      +---------------+------------+-------------------+-------------+------------+ < 50%              <180           <50%            <2.0         <40      +---------------+------------+-------------------+-------------+------------+ 50 - 69%         180- 230         >50%          2.0 - 4.0    40 - 100   +---------------+------------+-------------------+-------------+------------+ > 70%              >230           >50%            >4.0         >100     +---------------+------------+-------------------+-------------+------------+ Near Occlusion   variable        visible        variable     variable   +---------------+------------+-------------------+-------------+------------+ Total Occlusionundetectableno detectable lumen     N/A         N/A      +---------------+------------+-------------------+-------------+------------+ **PSV is 125-180 cm/s & ICA/CCA ratio >2.0 is also consistent with 50-69% stenosis. Final Interpretation Right Carotid Artery ICA stenosis between 50-69%.  Left Carotid Artery Less than 50% stenosis in the ICA.  Vertebral Arteries Both vertebral arteries were patent with antegrade flow. Subclavian Arteries Normal flow hemodynamics were seen in bilateral subclavian arteries.  Electronically signed by [id number] Almeda Dolores MD on 08/10/2023 at 3:02:36 PM.  Examination Protocol: The extracranial carotid systems are interrogated from beneath the clavicle to the angle of the mandible. The vertebral arteries are assessed in the mid cervical region and are traced to the origin when possible. The subclavian arteries are assessed proximally. PW Doppler velocities are obtained using  ACAS and NASCET methodology. B-mode imaging is used to help estimate plaque burden. Degree of stenosis in the internal carotid artery is based on SRU consensus criteria.  Summary of Findings  1. Right ICA stenosis 50 - 69%.  2. The Left ICA stenosis less than 50%. Findings Right Common Carotid Artery PSV / EDV cm/s 82 / 7 prox 69 / 8 mid 73 / 8 dist Plaque noted in the right common carotid artery, but does not appear significant. Ultrasound characteristics appear brightly echogenic and diffuse.  Right Internal Carotid Artery PSV / EDV cm/s 223 / 38 prox 145 / 22 mid 109 / 21 distal Plaque visualized in the right internal carotid artery with velocities suggestive of a 50 - 69% stenosis. Ultrasound characteristics appear brightly echogenic and diffuse. RT ICA/CCA  ratio = 3.23.  Right External Carotid Artery PSV / EDV cm/s 244 / 0 prox The right ECA appears within normal limits.  Right Vertebral Artery PSV / EDV cm/s 44 / 7 Flow in the right vertebral artery appears antegrade.  Right Subclavian Artery PSV / EDV cm/s 168 / 0 Flow in the right subclavian artery appears multiphasic and within normal limits.  Left Common Carotid Artery PSV / EDV cm/s 83 / 9 prox 69/ 71 / 6 distal Plaque noted in the left common carotid artery, but does not appear significant. Ultrasound characteristics appear diffuse and brightly echogenic.  Left Internal Carotid Artery PSV / EDV cm/s 89 / 12 prox 98 / 15 mid 84 / 16 distal Plaque visualized in the left internal carotid artery with velocities suggestive of a less than 50%. Ultrasound characteristics appear diffuse and brightly echogenic. LT ICA/CCA ratio = 1.43.  Left External Carotid Artery PSV / EDV cm/s 153 / 1 prox The left ECA appears within normal limits.  Left Vertebral Artery PSV / EDV cm/s 45 / 10 Flow in the left vertebral artery appears antegrade.  Left Subclavian Artery PSV / EDV cm/s 213 / 1 Flow in the left subclavian artery appears multiphasic and within normal limits.    Final   ECG 12 lead Result Date: 08/10/2023 SINUS RHYTHM WITH OCCASIONAL PREMATURE VENTRICULAR BEATS NONSPECIFIC ST ABNORMALITY PROLONGED QT NO PREVIOUS ECGS AVAILABLE Confirmed by Mazzella, Tony ([id number]) on 08/10/2023 12:27:06 PM  CT Neuro Comparison Of Outside Film Result Date: 08/10/2023 These images were imported for comparison purposes only.  They will not be interpreted and no charges will apply.  CT ANGIO HEAD NECK W WO CM W PERF (CODE STROKE) Result Date: 08/09/2023 CLINICAL DATA:  aphasia   EXAM: CT ANGIOGRAPHY HEAD AND NECK   CT PERFUSION BRAIN   TECHNIQUE: Multidetector CT imaging of the head and neck was performed using the standard protocol during bolus administration of intravenous contrast. Multiplanar CT image reconstructions and MIPs were obtained to evaluate the vascular anatomy. Carotid stenosis measurements (when applicable) are obtained utilizing NASCET criteria, using the distal internal carotid diameter as the denominator.   Multiphase CT imaging of the brain was performed following IV bolus contrast injection. Subsequent parametric perfusion maps were calculated using RAPID software.   RADIATION DOSE REDUCTION: This exam was performed according to the departmental dose-optimization program which includes automated exposure control, adjustment of the mA and/or kV according to patient size and/or use of iterative reconstruction technique.   CONTRAST:  OMNIPAQUE  IOHEXOL  350 MG/ML SOLN   COMPARISON:  Same day CT head.   FINDINGS: CTA NECK FINDINGS   Aortic arch: Aortic atherosclerosis. Great vessel origins are patent.   Right carotid system: Atherosclerosis carotid bifurcation and involving the proximal ICA with approximally 70% stenosis of the proximal ICA.   Left carotid system: Atherosclerosis at the carotid bifurcation and involving the proximal ICA with approximately 60% stenosis of the proximal ICA.   Vertebral arteries: Left dominant. Both vertebral  arteries are patent without hemodynamically significant stenosis.   Skeleton: No acute abnormality on limited assessment.   Other neck: No acute abnormality on limited assessment.   Upper chest: No acute abnormality on limited assessment.   Review of the MIP images confirms the above findings   CTA HEAD FINDINGS   Anterior circulation: Bilateral intracranial ICAs are patent. Right MCA and bilateral ACAs are patent without proximal high-grade stenosis. Severely stenotic versus occluded proximal left M2 MCA. Approximately 4 mm outpouching  arising from the left MCA bifurcation, suspicious for aneurysm that incorporates an M2 origin.   Posterior circulation: Bilateral intradural vertebral arteries, basilar artery and bilateral posterior cerebral arteries are patent without proximal edema plea significant stenosis.   Venous sinuses: As permitted by contrast timing, patent.   Review of the MIP images confirms the above findings   CT Brain Perfusion Findings:   ASPECTS: 9-10   CBF (<30%) Volume: 0mL   Perfusion (Tmax>6.0s) volume: 87mL   Mismatch Volume: 87mL   Infarction Location:   IMPRESSION: 1. Severely stenotic versus occluded proximal left M2 MCA. 2. Large area of correlated penumbra in the left MCA territory (87 mL). No core infarct identified. 3. Approximately 70% right and 60% left proximal ICA stenosis in the neck. 4. Approximately 4 mm outpouching arising from the left MCA bifurcation, suspicious for aneurysm that incorporates an M2 origin. Recommend attention on forthcoming catheter arteriogram.   Findings discussed with Dr. Malvina via telephone at 10:19 p.m.     Electronically Signed   By: Gilmore GORMAN Molt M.D.   On: 08/09/2023 22:25  CT Head Wo Contrast Result Date: 08/09/2023 CLINICAL DATA:  Code stroke.  Neuro deficit, acute, stroke suspected   EXAM: CT HEAD WITHOUT CONTRAST   TECHNIQUE: Contiguous axial images were obtained from the base of the skull through the  vertex without intravenous contrast.   RADIATION DOSE REDUCTION: This exam was performed according to the departmental dose-optimization program which includes automated exposure control, adjustment of the mA and/or kV according to patient size and/or use of iterative reconstruction technique.   COMPARISON:  CT head 09/06/2021.   FINDINGS: Brain: Encephalomalacia in the anterior right temporal and frontal lobes. Smaller area of encephalomalacia in the inferior left frontal lobe. Remote lacunar infarct in the posterior limb of the left internal capsule. No evidence of acute large vascular territory, acute hemorrhage, mass lesion or midline shift.   Vascular: No hyperdense vessel.  Calcific atherosclerosis.   Skull: Normal. Negative for fracture or focal lesion.   Sinuses/Orbits: No acute finding.   ASPECTS [facility] Stroke Program Early CT Score)   Total score (0-10 with 10 being normal): 10.   IMPRESSION: 1. No evidence of acute intracranial abnormality. ASPECTS is 10. 2. Encephalomalacia in the anterior right temporal and anterior/inferior frontal lobes.   Code stroke imaging results were communicated on 08/09/2023 at 9:38 pm to provider Dr. Malvina via telephone.     Electronically Signed   By: Gilmore GORMAN Molt M.D.   On: 08/09/2023 21:38    Foley Catheter Placement Note  Indications: traumatic foley catheter removal   Surgeon: Morene Rudder, MD, MPH  Assistants: None  Procedure Details  Patient was placed in the supine position, prepped with Betadine and draped in the usual sterile fashion.  We injected lidocaine  jelly per urethra prior to the procedure.  We then inserted a 18 [place] coude catheter per urethra which easily passed into the bladder without any resistance at the prostatic urethra.  We achieved return of clear yellow urine and then proceeded to insert 10 mL of sterile water into the Foley balloon.  The catheter was attached to a drainage bag and secured with a  StatLock.  Placement of the catheter had return of greater than 50 mL of straw colored urine which quickly turned to cherry.  The patient was flushed with 400cc of normal saline. Minimal clot returned. Draining urine light pink.                Complications:  None; patient tolerated the procedure well.             [1] No past medical history on file. [2] No past surgical history on file. [3] Current Facility-Administered Medications  Medication Dose Route Frequency Provider Last Rate Last Admin  . acetaminophen  (TYLENOL ) tablet 650 mg  650 mg Enteral tube: gastric Q6H PRN Mancilla, Trevi, MD      . NOREEN ON 08/12/2023] amlodipine  (NORVASC ) tablet 10 mg  10 mg Enteral tube: gastric Daily Kavin Duet, MD      . amlodipine  (NORVASC ) tablet 5 mg  5 mg Enteral tube: gastric Once Kavin Duet, MD      . aspirin  chewable tablet 81 mg  81 mg Enteral tube: gastric Daily Vicci Lacks [place], ACNP   81 mg at 08/11/23 9077  . atorvastatin  (LIPITOR) tablet 40 mg  40 mg Enteral tube: gastric Daily Vicci Lacks Sat, ACNP   40 mg at 08/11/23 9077  . enoxaparin  (LOVENOX ) syringe 40 mg  40 mg Subcutaneous Nightly Vicci Lacks Sat, ACNP   40 mg at 08/10/23 2015  . esomeprazole (NEXIUM) granules 40 mg  40 mg Enteral tube: gastric BID Zorita Powell Helling, ACNP   40 mg at 08/11/23 9077  . fentaNYL  (PF) (SUBLIMAZE ) injection 50 mcg  50 mcg Intravenous Q2H PRN Rardin, Wanda, PA   50 mcg at 08/11/23 0556  . FLUoxetine  (PROZAC ) capsule 40 mg  40 mg Enteral tube: gastric Daily Vicci Lacks Sat, ACNP   40 mg at 08/11/23 9077  . hydrALAZINE  (APRESOLINE ) injection 10 mg  10 mg Intravenous Q2H PRN Rardin, Wanda, PA   10 mg at 08/10/23 1325  . labetalol (NORMODYNE) injection  10 mg Intravenous Q2H PRN Rardin, Christine, PA   10 mg at 08/10/23 1855  . lidocaine  2% gel (XYLOCAINE ) jelly urojet 20 mL  20 mL Urethral Once Harrigan, Timothy James, PA      . multivitamins,  therapeutic with minerals tablet 1 tablet  1 tablet Enteral tube: gastric Daily Vicci Lacks Sat, ACNP   1 tablet at 08/11/23 9076  . oxyCODONE  (ROXICODONE ) immediate release tablet 5 mg  5 mg Enteral tube: gastric Q6H PRN Mancilla, Trevi, MD   5 mg at 08/11/23 0941  . polyethylene glycol (MIRALAX) packet 17 g  17 g Enteral tube: gastric Daily Vicci Lacks Sat, ACNP      . senna (SENOKOT) tablet 1 tablet  1 tablet Enteral tube: gastric Nightly Vicci Lacks Sat, ACNP   1 tablet at 08/10/23 2015  . thiamine  (B-1) injection 200 mg  200 mg Intravenous Q8H SCH Vicci Lacks [place], ACNP   200 mg at 08/11/23 9389  [4] No Known Allergies [5] Social History Tobacco Use  . Smoking status: Unknown  [6] History reviewed. No pertinent family history.

## 2023-08-11 NOTE — Progress Notes (Signed)
 Neurocritical Care Daily Progress Note   CODE STATUS:    Code Status: Full Code  I discussed and confirmed Code Status with patient or HCDM  Date of service: 08/11/2023 Hospital Day:  LOS: 1 day    HPI   Gregory Lane is a 73 y.o. male with PMHx prior right sided stroke with residual left sided weakness (ambulates/ performs ADLs independently) and blindness in left eye, AFib on warfarin (unclear if taking as INR subtherapeutic), COPD, HFpEF, HTN, current smoker, ETOH (reportedly drinks 12 pack of beer daily), RLL adenocarcinoma s/p SBRT (2024) who presented to OSH with concern for stroke with [facility] 6/19 at 1500. Around 1800 patient's sister went to check on him and noticed slurred speech, difficulty answering questions, a right sided facial droop and right sided weakness. On arrival to [facility] patient had dysarthria and R sided weakness with NIHSS 9. CTH without hemorrhage, CTA Head revealed left M2 occlusion. Not a candidate for thrombolytics in setting of Warfarin use. He was transferred to [facility] for consideration of mechanical thrombectomy. Patient with SBP in 200s during transport so he was started on a Nicardipine infusion. On arrival to [facility] NIHSS 11 for mild R sided weakness, mild R facial droop, dysarthria and severe global aphasia. He was taken for mechanical thrombectomy with subsequent TICI 3 recanalization x 5 passes. He is now being admitted to NSICU for post-procedural monitoring and continued management.   History   PAST MEDICAL HISTORY Past Medical History[1]  SURGICAL HISTORY Past Surgical History[2]  HOME MEDICATIONS Meds ordered prior to current encounter[3]  Allergies Patient has no known allergies.  Family History Family History[4]  Social History Short Social History[5]  Review of Systems   Pertinent items are noted in HPI.   Assessment and Plan   Gregory Lane is a 73 y.o. male admitted to NSICU with a diagnosis of acute ischemic stroke  Overnight:  STAT CT head, possibly liberalize BP to < 180  Neuro: Mobility goal: 2 (HOB elevation)  **Acute ischemic stroke s/p mechanical thrombectomy, left MCA **presumed cardioembolic  - NIHSS 11 on initial presentation, 14 on arrival to ICU - last known normal 6/18 at 1500 - not a candidate for IV thrombolytic in setting of warfarin use - s/p mechanical thrombectomy with subsequent TICI 3 perfusion, 5 passes  - R groin site soft without evidence of bleeding, no hematoma, dressing C/D/I  - DP/PT pulses 2+  - flat x 1 hr - stroke risk factors include: prior stroke, AFib, HTN, HLD, smoker, ETOH, cancer hx - start ASA and statin for secondary prevention  - send labs for secondary stroke prevention: fasting lipid panel (LDL 53, atorvastatin  40 at home), HgbA1c 4.9, and TSH 1.5 - check cardiac enzymes and ECG to assess for ischemia - telemetry monitoring to assess for arrhythmia - obtain cardiac echo if enzymes/ECG abnormal or concern for cardioembolic source (see below) - CTA neck obtained at OSH - CUS:  1. Right ICA stenosis 50 - 69%.  2. The Left ICA stenosis less than 50%. Plaque noted in the right common carotid artery, but does not appear significant. Ultrasound characteristics appear brightly echogenic and diffuse. - tobacco cessation counseling - PT/OT/SLP consults   **Depression - resume home Fluoxetine  40 mg daily   **ETOH - reportedly drinks 12 beers per day; unknown last drink  - start MVI and thiamine  - monitor for withdrawal   **Pain - tylenol  prn mild pain - oxycodone  prn moderate pain - fentanyl  prn severe pain  **  Family communication  - family updated daily  Cardiovascular: Admission weight: 89.4 kg Daily Weight: 87.2 kg (192 lb 3.9 oz)   Hemodynamic goals: - MAP > 65 - SBP 120-180 (given x5 passes during IR)  **Hypertensive emergency - start nicardipine infusion to meet above goals - resume home meds Losartan  50 mg BID  - check cardiac enzymes and ECG -  patient has high risk of end organ damage due to hypertension  **HFpEF - 02/28/22 echo with EF 60-65% - admission echo pending for stroke work-up  ECHO here: Summary   1. The left ventricle is normal in size with mildly increased wall thickness.   2. The left ventricular systolic function is normal, LVEF is visually estimated at 60-65%.   3. Aortic sclerosis.   4. The left atrium is not well visualized but probably mildly dilated in size.   5. The right ventricle is normal in size, with normal systolic function.  **AFib - obtain admission EKG, appears to be in sinus rhythm on monitor  - reportedly taking Warfarin, however INR at OSH 1.1 - verify use and discuss anticoagulation plan with Neurology  Pulmonary  - maintaining SPO2 on RA - supplemental O2 as needed to maintain SPO2 > 90% given COPD  **COPD **Tobacco abuse - not on home O2 or COPD medications - place nicotine  patch - smoking cessation counseling  **RLL adenocarcinoma - s/p SBRT 2024  FEN Body mass index is 24.67 kg/m.   GI prophylaxis: PPI (home med) Last BM Date:  (pta) Foley catheter: Foley required for hematuria. Replaced by urology 6/21    **Nutrition - SLP evaluation pending  **Glycemic control - start FSBS and SSI to prevent hyperglycemia and to assess insulin needs - check Hgb A1c  **Fluids/electrolytes - goal euvolemia - follow strict ins/outs - check admission chemistry panel and supplement electrolytes as needed  **GERD - continue home pantoprazole  40 mg BID   Heme/ID Current DVT prophylaxis: SCDs, Lovenox  40 mg daily  Current Access:        -  PIV x 2      - Arterial line: No arterial line present.      - Central venous line: No central line present.  Tubes and drains: Patient Lines/Drains/Airways Status     Active Active Lines, Drains, & Airways     Name Placement date Placement time Site Days   NG/OG Tube Right nostril 08/10/23  1100  Right nostril  less than 1    Peripheral IV 08/10/23 Anterior;Distal;Right;Upper Arm 08/10/23  --  Arm  1   Peripheral IV 08/10/23 Distal;Left Antecubital 08/10/23  --  Antecubital  1   Peripheral IV 08/10/23 Anterior;Right Forearm 08/10/23  0800  Forearm  1               Objective Data   All vital signs and resulted laboratory studies for the past 24 hours have been reviewed. All ordered medications have been reviewed.  Temp:  [36.1 C (97 F)-37.1 C (98.8 F)] 37 C (98.6 F) Pulse:  [57-96] 57 SpO2 Pulse:  [56-96] 56 Resp:  [11-23] 12 BP: (119-165)/(48-99) 154/82 MAP (mmHg):  [66-105] 97 A BP-2: (133-179)/(48-123) 142/77 MAP:  [75 mmHg-128 mmHg] 102 mmHg SpO2:  [91 %-98 %] 96 %  Intake/Output Summary (Last 24 hours) at 08/11/2023 0949 Last data filed at 08/11/2023 0600 Gross per 24 hour  Intake 1345.62 ml  Output 1925 ml  Net -579.38 ml    Physical Exam   General Exam: General: Lying in  bed. No obvious distress.  ENT:  Mucous membranes moist. Oropharynx clear. Cardiovascular:  Regular rate and rhythm.  No murmurs.   2+ radial pulses bilaterally.   Respiratory: Breathing is comfortable and unlabored.  Lungs are clear to ausculation bilaterally.   Gastrointestinal: Soft, nontender, nondistended. Extremities: Warm and well-perfused. No cyanosis, clubbing, or edema. Skin: No obvious rashes or ecchymoses.  Neurological Exam: Mental Status LOC: arouses to voice Orientation: UTA Neglect: UTA Speech: moderate dysarthria Language: severe aphasia, garbled speech, unable to follow commands  Cranial Nerves Pupils: PERRL Corneals: present bilaterally Gaze: left gaze Face Motor: right UMN facial weakness Cough: strong Tongue: UTA  Vision: Unable to assess visual fields, does not appear to blink to threat on R. Reportedly blind at baseline in L eye.  Motor:  RUE: withdrawal with drift to bed LUE: spontaneous and antigravity RLE: withdrawal with drift to bed LLE: spontaneous and antigravity    Sensory:   Sensory: UTA  Glasgow Coma Score Motor: withdrawal from pain = 4 Verbal: incomprehensible sounds = 2 Eyes: eyes open to voice = 3  DISPOSITION   The patient requires admission to the [facility] Unit for management of the above conditions.  Estimated Transfer Date:  6/24  BILLING   This patient is critically ill or injured with the impairment of vital organ systems such that there is a high probability of imminent or life threatening deterioration in the patient's condition. The reason the patient is critically ill and the nature of the treatment and management provided to manage the critically ill patient is as listed in above note.   I directly provided 31 minutes of critical care time as documented in this note. Time includes: direct patient care, patient reassessment, coordination of patient care, interpretation of data, review of patient medical records, and documentation of patient care. This time is exclusive of separately billable procedures.  Gregory DELENA Moellers, MD Neurocritical Care       [1] No past medical history on file. [2] No past surgical history on file. [3] No current facility-administered medications on file prior to encounter.   Current Outpatient Medications on File Prior to Encounter  Medication Sig  . atorvastatin  (LIPITOR) 40 MG tablet Take 1 tablet (40 mg total) by mouth daily.  . celecoxib  (CELEBREX ) 200 MG capsule Take 1 capsule (200 mg total) by mouth two (2) times a day.  . FLUoxetine  (PROZAC ) 20 MG capsule Take 2 capsules (40 mg total) by mouth daily.  . pantoprazole  (PROTONIX ) 40 MG tablet Take 1 tablet (40 mg total) by mouth Two (2) times a day (30 minutes before a meal).  [4] History reviewed. No pertinent family history. [5] Social History Tobacco Use  . Smoking status: Unknown

## 2023-08-14 ENCOUNTER — Ambulatory Visit: Admitting: Internal Medicine

## 2023-08-16 NOTE — Progress Notes (Signed)
 ------------------------------------------------------------------------------- Attestation signed by Naomi Ku, MD at 08/16/23 1239 Attending Physician Attestation: I saw and evaluated the patient on 08/16/2023. I discussed the findings, assessment, and plan with Dr. Wynelle and agree with the findings and plan as documented in the resident's progress note except for the corrections/additions noted below.  Briefly, the patient is a 73 year old male with a reported past medical history of stroke with residual left hemiparesis, left eye blindness, atrial fibrillation on warfarin (although subtherapeutic on presentation), COPD, heart failure with preserved ejection fraction, right lower lobe adenocarcinoma s/p radiation, who presented with right hemiparesis and aphasia and is s/p thrombectomy with TICI 3 after 5 passes.  Patient reportedly had no adverse overnight events.  On exam, he is awake and alert.  His speech output continues to improve with more intelligible short sentences although a portion remains unintelligible or he will perseverate on certain phrases (e.g. I reckon).  He continues to have global aphasia.  He can follow some simple commands, but continues to require visual cues for most questions or commands.  He has a left gaze preference, but can cross midline.  He has right lower facial weakness.  He is antigravity in all 4 extremities although with mild right hemiparesis.   Plan -ASA for now, but plan to transition to anticoagulation for atrial fibrillation after calorie count and assessment as to whether he needs a PEG for nutrition. -Goal blood pressure is gradual reduction to eventual goal of normotension.  Avoid sudden drops or hypotension.  Continue losartan  -Goal LDL<70.  Atorvastatin  for goal LDL<70 -Hemoglobin A1c is within goal<7 -Telemetry -Continue Foley for traumatic Foley removal.  Trial of void on 6/28, per urology.  Discuss with urology as to whether initiation  of anticoagulation changes any of their recommendations. -PT/OT/ST  [name], MD, PhD [facility] Department of Neurology -------------------------------------------------------------------------------   Neurology Inpatient Team A [facility]) Daily Progress Note   Patient: Gregory Lane Code Status:  Orders Placed This Encounter  Procedures  . Full code    Standing Status:   Standing    Number of Occurrences:   1   Level of Care: Acute floor status.  LOS: 6 days    Overnight Events & Subjective:   No acute overnight events. Patient, on NGT and PO diet level 6 soft and bite sized solids and Level 0 thin liquids. Calory count for a couple days, per Nutrition: currently meeting 64% of estimated kcal needs and 55% of estimated protein needs. Some mild bleeding noted around foley, but no blood noted in urine bag.   Physical Exam:   General Exam: General Appearance:Well appearing. In no acute distress. HEENT: Sclera anicteric without injection. Oropharyngeal membranes are moist with no erythema or exudate. Neck: Deferred. Lungs: Normal work of breathing. Occasional wet cough, crackles and wheezes heard anteriorly. Heart: Regular rate and rhythm. Abdomen: Nondistended. Mild bleeding around foley Extremities: No edema noticed on exam.  Neurological Exam: Mental Status: Patient continues to have global aphasia, though it appears to be improving. He is able to follow some commands (turn to this side, take a deep breath).He is able to verbalize few one-word sentences (yes, no) in a slurred manner. Today verbalized a few two-word sentences, though in a slurred manner. Cranial Nerves: Pupils are 4 mm in the right and 3 mm in the left, reactive and patient is alert. Slight right lower facial droop noted. Motor Exam: RUE: 5/5 grossly throughout. LUE: 5/5 grossly throughout. RLE: 5/5 grossly throughout. LLE: 5/5 grossly throughout.  Reflexes: DTRs are 1+ and symmetric throughout. Toes are  downgoing bilaterally. Sensory: Deferred , responds to touch stimuli Cerebellar/Coordination/Gait: Unable to assess due to mental status. No ataxia on witnessed movements.     Assessment/Plan:   Assessment: Gregory Lane is a 72 y.o. male with a pertinent past medical history of stroke with residual left-sided weakness, blindness left eye, A-fib on warfarin, COPD, HFpEF, HTN, current smoker, ethanol abuse, RLL adenocarcinoma s/p SBRT (2024) who was admitted to [facility] on 08/10/2023 for thrombectomy for left MCA occlusion .    ACTIVE PROBLEMS:  Ischemic stroke MCA territory s/p thrombectomy: LKN 08/09/23 15: 00. NIHSS was 11 on presentation to [facility] with points for global aphasia, right facial droop, right-sided weakness. Did not receive thrombolytics. Left M1/M2 occlusion on CTA.  Status post mechanical thrombectomy on 6/20, TICI 3 after 5 passes. Etiology could include cardioembolic in the setting of subtherapeutic INR vs athlerosclerotic in the setting of demonstrated intracranial disease.    Workup: Resulted: - CT head: No evidence of acute infarct - CTA/CTP head occlusion left M1/M2 with 87 mm penumbra in the left MCA territory and no core - CTA neck: 70% right ICA stenosis, 60% left ICA stenosis.  Possible 4 mm aneurysm at the left MCA bifurcation - PVL carotids: R ICA stenosis 50-69%, L ICA stenosis less than 50% - A1c 4.9% - LDL 53 - TTE: LVEF >60%, mild left atrial enlargement - MRI brain:  Remonstrated left MCA territory infarct primarily involving the parietal lobe.  - PT/OT post-discharge recs: 5x weekly, high intensity - PM&R: recommended AIR, referral received and pt under review. - Switched to cyclic TF to promote increased PO intake - Per nutrition: Currently meeting 64% of estimated kcal needs and 55% of estimated protein needs.  - Pending dispo      Plan: - Continue PO diet per SLP: lvl 6 soft and bite sized food and  lvl 0 thin liquids, following calory count for 2 days -  STAT CT head w/o contrast for any significant worsening of neurologic status - Was started on aspirin  81 mg daily on 6/21, however plan to warfarin or DOAC when able (pending swallow eval today) - Telemetry - Per 2018 ACC/AHA Guidelines, plan to initiate high-intensity statin treatment (reduce LDL by > 50%) as patient's age is < 45. Statin: Atorvastatin  changed from 80 mg daily to 40 mg daily as last LDL was 53 (6/20) - Duoneb PRN added due to crackles heard on exam, likely due to change in diet - Will update family      # Traumatic foley catheter removal: Urology re-placed foley 6/21 30F coude without difficulty. Return of clear pink urine. Flushed with minimal clots.  - Continue foley catheter in place until 6/28 - See Urology note from 6/21 if TOV fails on 6/28             -Remove all 30cc from balloon prior to foley removal for TOV             -If fails TOV -> place 30Fr coude catheter and Lidojet -> if unable to place -> page SRU and have bladder scan volume -If catheter stops draining: okay to flush with normal saline (60cc via a catheter tip syringe, please break seal from foley drainage bag). If unable to flush, please contact the Urology consult pager.  -Continue perineal pressure x 72 hours with towels compressing perineum       STABLE PROBLEMS:  #Depression: Continue home Fluoxetine  40 mg daily  #  ETOH: Reportedly drinks 12 beers per day; unknown last drink. At this time no signs of withdrawal #COPD Tobacco abuse: Not on home O2 or COPD medications. Nicotine  patch ordered. Smoking cessation counseling consulted #RLL adenocarcinoma s/p SBRT 2024 #GERD: continue home pantoprazole  40 mg BID (on Nexium here) #HFpEF: 02/28/22 echo with EF 60-65%. Stable at repeat TTE this admission #Afib: Reportedly taking Warfarin, however INR at OSH 1.1. Plan to restart warfarin or DOAC when able as above    RESOLVED PROBLEMS: # Hypertensive emergency (resolved): Systolic blood pressure 219 at  OSH, started on nicardipine drip transport. Stable off nicardipine drip since 6/21. - Continue Losartan  50mg  (increased 6/22)     # Discharge Planning:  - Case management: consulted. Recommendations appreciated. - Social work: N/A - PT: 5xH - OT: 5xH - SLP: lvl 6/soft and bite size solids and lvl 0/thin liquids - PM&R: recommended AIR - Expected Discharge Disposition: Acute inpatient rehabilitation. - Follow-up appointments with  PM&R Stroke Nikita Harrington  .  # Checklist: - Diet: Enteral nutrition via NGT and PO diet per SLP (see above)  - IV fluids: no - Bowel Regimen: Senna and Miralax Last BM Date: 08/14/23 - GI PPX: On Nexium - DVT PPX: Lovenox  40 mg [place] daily  -Lines/Access: PIV X2 -Foley: Yes (see above)  Patient Lines/Drains/Airways Status     Active Active Lines, Drains, & Airways     Name Placement date Placement time Site Days   NG/OG Tube Right nostril 08/10/23  1100  Right nostril  5   Urethral Catheter Latex 08/11/23  1200  Latex  4   Peripheral IV 08/10/23 Anterior;Right Forearm 08/10/23  0800  Forearm  5            This patient was seen and discussed with Dr. Naomi, who agrees with the above assessment and plan.    Please page Neurology Team A at [phone] for any questions/concerns.  Rae Band, MD PGY-1 [facility] Department of Neurology   Data Review:   Contact Information: Family contact: Grine,Carolyn [phone]  PCP: Pcp, None Per Patient  Medications: Scheduled medications: Scheduled Medications[1] Continuous infusions: Infusions Meds[2] PRN medications: PRN Medications[3]  24 hour vital signs: Temp:  [36.5 C (97.7 F)-37 C (98.6 F)] 36.6 C (97.9 F) Pulse:  [59-65] 59 Resp:  [16-18] 16 BP: (102-154)/(47-73) 147/47 MAP (mmHg):  [70-90] 70 SpO2:  [96 %-99 %] 99 %  Ins and Outs: I/O this shift: In: 320 [NG/GT:320] Out: 800 [Urine:800]  Imaging and relevant diagnostic studies reviewed, pertinent findings as in above  assessment and plan.       [1] . aspirin   81 mg Enteral tube: gastric Daily  . atorvastatin   40 mg Enteral tube: gastric Daily  . enoxaparin  (LOVENOX ) injection  40 mg Subcutaneous Nightly  . esomeprazole  40 mg Enteral tube: gastric BID  . FLUoxetine   40 mg Enteral tube: gastric Daily  . losartan   50 mg Enteral tube: gastric Daily  . multivitamins (ADULT)  1 tablet Enteral tube: gastric Daily  . polyethylene glycol  17 g Enteral tube: gastric BID  . senna  2 tablet Enteral tube: gastric BID  [2] [3] acetaminophen , ipratropium-albuterol 

## 2023-08-17 NOTE — Progress Notes (Signed)
 ------------------------------------------------------------------------------- Attestation with edits by Naomi Ku, MD at 08/17/23 1234 Attending Physician Attestation: I saw and evaluated the patient on 08/17/2023. I discussed the findings, assessment, and plan with Dr. Ethlyn and agree with the findings and plan as documented in the resident's progress note except for the corrections/additions noted below.  Briefly, the patient is a 73 year old male with a reported past medical history of stroke with residual left hemiparesis, left eye blindness, atrial fibrillation on warfarin (although subtherapeutic on presentation), COPD, heart failure with preserved ejection fraction, right lower lobe adenocarcinoma s/p radiation, who presented with right hemiparesis and aphasia and is s/p thrombectomy with TICI 3 after 5 passes.  Patient reportedly vomited his tube feeds although he has otherwise reportedly been eating well.  He denies any complaints today and indicates his speech is improving.  On exam, he is awake and alert.  Speech output continues to improve with longer and more intelligible short sentences although he continues to perseverate on extended speech or questioning.  He was able hum along with singing happy birthday today.  He continues to have global aphasia, but comprehension also seems to be improving and he seems to be following commands more briskly although he still requires visual cues.  He has a left gaze preference, but can cross midline.  He has right lower facial weakness.  He is antigravity in all 4 extremities although with mild right hemiparesis (arm weaker than leg).   Plan -ASA for now, but plan to transition to anticoagulation for atrial fibrillation after calorie count and assessment as to whether he needs a PEG for nutrition. -Goal blood pressure is gradual reduction to eventual goal of normotension.  Avoid sudden drops or hypotension.  Continue losartan  -Goal LDL<70.   Atorvastatin  for goal LDL<70 -Hemoglobin A1c is within goal<7 -Telemetry -Continue Foley for traumatic Foley removal.  Trial of void on 6/28, per urology.  Discussed with urology plan for anticoagulation and no change in Foley plan per urology. -PT/OT/ST  [name], MD, PhD [facility] Department of Neurology -------------------------------------------------------------------------------   Neurology Inpatient Team A [facility]) Daily Progress Note   Patient: Gregory Lane Code Status:  Orders Placed This Encounter  Procedures  . Full code    Standing Status:   Standing    Number of Occurrences:   1   Level of Care: Acute floor status.  LOS: 7 days    Overnight Events & Subjective:   No acute overnight events. Family updated yesterday over the phone. Patient threw up PM tubefeed. Was able to eat 75% of all meals throughout the day yesterday. Nursing noted blood at foley insertion site.    Physical Exam:   General Exam: General Appearance:Well appearing. In no acute distress. HEENT: Sclera anicteric without injection. Oropharyngeal membranes are moist with no erythema or exudate. Neck: Deferred. Lungs: Normal work of breathing. Occasional wet cough, crackles and wheezes heard anteriorly. Heart: Regular rate and rhythm. Abdomen: Nondistended. Mild bleeding around foley Extremities: No edema noticed on exam.  Neurological Exam: Mental Status: Patient continues to have global aphasia, though it appears to be improving. He is able to follow some commands (turn to this side, take a deep breath).He is able to verbalize few one-word sentences (yes, no) in a slurred manner. Today verbalized a few two-word sentences, though in a slurred manner. Cranial Nerves: Pupils are 4 mm in the right and 3 mm in the left, reactive and patient is alert. Slight right lower facial droop noted. Motor Exam: RUE: 4+/5  grossly throughout. LUE: 5/5 grossly throughout. RLE: 5-/5 grossly throughout. LLE: 5/5  grossly throughout. Reflexes: DTRs are 1+ and symmetric throughout. Toes are downgoing bilaterally. Sensory: Deferred , responds to touch stimuli Cerebellar/Coordination/Gait: Unable to assess due to mental status. No ataxia on witnessed movements.     Assessment/Plan:   Assessment: Gregory Lane is a 73 y.o. male with a pertinent past medical history of stroke with residual left-sided weakness, blindness left eye, A-fib on warfarin, COPD, HFpEF, HTN, current smoker, ethanol abuse, RLL adenocarcinoma s/p SBRT (2024) who was admitted to [facility] on 08/10/2023 for thrombectomy for left MCA occlusion .    ACTIVE PROBLEMS:  Ischemic stroke MCA territory s/p thrombectomy: LKN 08/09/23 15: 00. NIHSS was 11 on presentation to [facility] with points for global aphasia, right facial droop, right-sided weakness. Did not receive thrombolytics. Left M1/M2 occlusion on CTA.  Status post mechanical thrombectomy on 6/20, TICI 3 after 5 passes. Etiology could include cardioembolic in the setting of subtherapeutic INR vs athlerosclerotic in the setting of demonstrated intracranial disease.    Workup: Resulted: - CT head: No evidence of acute infarct - CTA/CTP head occlusion left M1/M2 with 87 mm penumbra in the left MCA territory and no core - CTA neck: 70% right ICA stenosis, 60% left ICA stenosis.  Possible 4 mm aneurysm at the left MCA bifurcation - PVL carotids: R ICA stenosis 50-69%, L ICA stenosis less than 50% - A1c 4.9% - LDL 53 - TTE: LVEF >60%, mild left atrial enlargement - MRI brain:  Remonstrated left MCA territory infarct primarily involving the parietal lobe.  - PT/OT post-discharge recs: 5x weekly, high intensity - PM&R: recommended AIR, referral received and pt under review. - Switched to cyclic TF to promote increased PO intake - Per nutrition: Currently meeting 64% of estimated kcal needs and 55% of estimated protein needs.  - Pending dispo      Plan: - Continue PO diet per SLP: lvl 6 soft and  bite sized food and  lvl 0 thin liquids, following calory count for 2 days - Aspirin  81 mg daily (6/21), plan to transition to warfarin or DOAC pending nutrition re-evaluation - Plan to remove NGT today pending nutrition reassessment - Continue Telemetry - Atorvastatin  changed from 80 mg daily to 40 mg daily as last LDL was 53 (6/20) - Duoneb PRN added due to crackles heard on exam, likely due to change in diet  # Traumatic foley catheter removal: Urology re-placed foley 6/21 56F coude without difficulty. Return of clear pink urine. Flushed with minimal clots. No contradictions from Urology about starting different anticoagulation for Afib.  - Continue foley catheter in place until 6/28 - See Urology note from 6/21 if TOV fails on 6/28             -Remove all 30cc from balloon prior to foley removal for TOV             -If fails TOV -> place 56Fr coude catheter and Lidojet -> if unable to place -> page SRU and have bladder scan volume -If catheter stops draining: okay to flush with normal saline (60cc via a catheter tip syringe, please break seal from foley drainage bag). If unable to flush, please contact the Urology consult pager.  -Continue perineal pressure x 72 hours with towels compressing perineum    STABLE PROBLEMS:  #Depression: Continue home Fluoxetine  40 mg daily  #ETOH: Reportedly drinks 12 beers per day; unknown last drink. At this time no signs of withdrawal #  COPD Tobacco abuse: Not on home O2 or COPD medications. Nicotine  patch ordered. Smoking cessation counseling consulted #RLL adenocarcinoma s/p SBRT 2024 #GERD: continue home pantoprazole  40 mg BID (on Nexium here) #HFpEF: 02/28/22 echo with EF 60-65%. Stable at repeat TTE this admission #Afib: Reportedly taking Warfarin, however INR at OSH 1.1. Plan to restart warfarin or DOAC when able as above    RESOLVED PROBLEMS: # Hypertensive emergency (resolved): Systolic blood pressure 219 at OSH, started on nicardipine drip  transport. Stable off nicardipine drip since 6/21. - Continue Losartan  50mg  (increased 6/22)     # Discharge Planning:  - Case management: consulted. Recommendations appreciated. - Social work: N/A - PT: 5xH - OT: 5xH - SLP: lvl 6/soft and bite size solids and lvl 0/thin liquids - PM&R: recommended AIR - Expected Discharge Disposition: Acute inpatient rehabilitation. - Follow-up appointments with  PM&R Stroke Nikita Harrington  .  # Checklist: - Diet: Enteral nutrition via NGT and PO diet per SLP (see above)  - IV fluids: no - Bowel Regimen: Senna and Miralax Last BM Date: 08/16/23 - GI PPX: On Nexium - DVT PPX: Lovenox  40 mg [place] daily  -Lines/Access: PIV X2 -Foley: Yes (see above)  Patient Lines/Drains/Airways Status     Active Active Lines, Drains, & Airways     Name Placement date Placement time Site Days   NG/OG Tube Right nostril 08/10/23  1100  Right nostril  6   Urethral Catheter Latex 08/11/23  1200  Latex  5   Peripheral IV 08/10/23 Anterior;Right Forearm 08/10/23  0800  Forearm  6            This patient was seen and discussed with Dr. Naomi, who agrees with the above assessment and plan.    Please page Neurology Team A at [phone] for any questions/concerns.  Rae Band, MD PGY-1 [facility] Department of Neurology   Data Review:   Contact Information: Family contact: Krieger,Carolyn [phone]  PCP: Pcp, None Per Patient  Medications: Scheduled medications: Scheduled Medications[1] Continuous infusions: Infusions Meds[2] PRN medications: PRN Medications[3]  24 hour vital signs: Temp:  [37 C (98.6 F)-37.3 C (99.1 F)] 37.1 C (98.8 F) Pulse:  [62-70] 64 Resp:  [16-18] 16 BP: (113-156)/(51-85) 156/65 MAP (mmHg):  [68-94] 85 SpO2:  [95 %-99 %] 98 %  Ins and Outs: No intake/output data recorded.  Imaging and relevant diagnostic studies reviewed, pertinent findings as in above assessment and plan.       [1] . aspirin   81 mg Enteral  tube: gastric Daily  . atorvastatin   40 mg Enteral tube: gastric Daily  . enoxaparin  (LOVENOX ) injection  40 mg Subcutaneous Nightly  . esomeprazole  40 mg Enteral tube: gastric BID  . FLUoxetine   40 mg Enteral tube: gastric Daily  . losartan   50 mg Enteral tube: gastric Daily  . multivitamins (ADULT)  1 tablet Enteral tube: gastric Daily  . polyethylene glycol  17 g Enteral tube: gastric BID  . senna  2 tablet Enteral tube: gastric BID  [2] [3] acetaminophen , ipratropium-albuterol , ondansetron  **OR** ondansetron 

## 2023-08-18 NOTE — Progress Notes (Signed)
 ------------------------------------------------------------------------------- Attestation signed by Naomi Ku, MD at 08/18/23 1510 Attending Physician Attestation: I saw and evaluated the patient on 08/18/2023. I discussed the findings, assessment, and plan with Dr. Ethlyn and agree with the findings and plan as documented in the resident's progress note except for the corrections/additions noted below.  Briefly, the patient is a 73 year old male with a reported past medical history of stroke with residual left hemiparesis, left eye blindness, atrial fibrillation on warfarin (although subtherapeutic on presentation), COPD, heart failure with preserved ejection fraction, right lower lobe adenocarcinoma s/p radiation, who presented with right hemiparesis and aphasia and is s/p thrombectomy with TICI 3 after 5 passes.  Patient reportedly had no adverse overnight events.  He denies any complaints today.  On exam, he is awake and alert.  Speech output continues to improve with longer and more intelligible short sentences.  He continues to have global aphasia, but comprehension also seems to be improving and he seems to require fewer cues although he will  still approximate many appendicular commands.  He has a left gaze preference, but can cross midline.  He has right lower facial weakness.  He is antigravity in all 4 extremities although with mild right hemiparesis (arm weaker than leg).   Plan -ASA for now, but plan to transition to anticoagulation for atrial fibrillation after calorie count and assessment as to whether he needs a PEG for nutrition. -Goal blood pressure is gradual reduction to eventual goal of normotension.  Avoid sudden drops or hypotension.  Continue losartan  -Goal LDL<70.  Atorvastatin  for goal LDL<70 -Hemoglobin A1c is within goal<7 -Telemetry -Trial of void today -Hold tube feeds and continue calorie count per nutrition -PT/OT/ST  [name], MD, PhD [facility] Department of  Neurology -------------------------------------------------------------------------------   Neurology Inpatient Team A [facility]) Daily Progress Note   Patient: Gregory Lane Code Status:  Orders Placed This Encounter  Procedures  . Full code    Standing Status:   Standing    Number of Occurrences:   1   Level of Care: Acute floor status.  LOS: 8 days    Overnight Events & Subjective:   No acute overnight events. TOV today for foley cath removal. Improving dysarthria and orientation. Eating well and doing a PO trial and holding cyclic TF.  Will reach out to family about patient's dentures for meal time.    Physical Exam:   General Exam: General Appearance:Well appearing. In no acute distress. HEENT: Sclera anicteric without injection. Oropharyngeal membranes are moist with no erythema or exudate. Neck: Deferred. Lungs: Normal work of breathing. Occasional wet cough, crackles and wheezes heard anteriorly. Heart: Regular rate and rhythm. Abdomen: Nondistended. Mild bleeding around foley Extremities: No edema noticed on exam.  Neurological Exam: Mental Status: Patient's speech getting better each day. Able to respond with 3-4 word phrases. Oriented to self and place. Global aphasia is improving a lot.  Cranial Nerves: Pupils are 4 mm in the right and 3 mm in the left, reactive and patient is alert. Slight right lower facial droop noted. Motor Exam: RUE: 4+/5 grossly throughout. LUE: 5/5 grossly throughout. RLE: 5-/5 grossly throughout. LLE: 5/5 grossly throughout. Reflexes: DTRs are 1+ and symmetric throughout. Toes are downgoing bilaterally. Sensory: Deferred , responds to touch stimuli Cerebellar/Coordination/Gait: Unable to assess due to mental status. No ataxia on witnessed movements.     Assessment/Plan:   Assessment: Gregory Lane is a 73 y.o. male with a pertinent past medical history of stroke with residual left-sided weakness,  blindness left eye, A-fib on warfarin,  COPD, HFpEF, HTN, current smoker, ethanol abuse, RLL adenocarcinoma s/p SBRT (2024) who was admitted to [facility] on 08/10/2023 for thrombectomy for left MCA occlusion .  ACTIVE PROBLEMS:  Ischemic stroke MCA territory s/p thrombectomy: LKN 08/09/23 15: 00. NIHSS was 11 on presentation to [facility] with points for global aphasia, right facial droop, right-sided weakness. Did not receive thrombolytics. Left M1/M2 occlusion on CTA.  Status post mechanical thrombectomy on 6/20, TICI 3 after 5 passes. Etiology could include cardioembolic in the setting of subtherapeutic INR vs athlerosclerotic in the setting of demonstrated intracranial disease.    Workup: Resulted: - CT head: No evidence of acute infarct - CTA/CTP head occlusion left M1/M2 with 87 mm penumbra in the left MCA territory and no core - CTA neck: 70% right ICA stenosis, 60% left ICA stenosis.  Possible 4 mm aneurysm at the left MCA bifurcation - PVL carotids: R ICA stenosis 50-69%, L ICA stenosis less than 50% - A1c 4.9% - LDL 53 - TTE: LVEF >60%, mild left atrial enlargement - MRI brain:  Remonstrated left MCA territory infarct primarily involving the parietal lobe.  - PT/OT post-discharge recs: 5x weekly, high intensity - PM&R: recommended AIR, referral received and pt under review.   Plan: - Continue PO diet per SLP: lvl 6 soft and bite sized food and  lvl 0 thin liquids, following calory count for 2 days - Aspirin  81 mg daily (6/21), plan to transition to warfarin or DOAC pending nutrition re-evaluation - Continue Telemetry - Atorvastatin  changed from 80 mg daily to 40 mg daily as last LDL was 53 (6/20)   Dysphagia/ FEN/ Nutrition:  Initial consult with SLP on 6/23 recommended pureed diet with no liquids and having NGT for PO meds. Had MBSS done on 6/24 and recommended soft and bite-sized solids and thin liquids. Nutrition consulted on 6/25 and recommended enteral nutrition with calorie count. Patient able to have increased PO intake,  re-assessment from nutrition recommended holding cyclic tube feeds via NGT and doing PO trial over the weekend. Will reach out to nutrition on 7/1 for reassessment.  -Hold cyclic TF over weekend for PO trial -If calorie count sufficient per nutrition reassessment on 7/1, can pull NGT  # Traumatic foley catheter removal: Urology re-placed foley 6/21 19F coude without difficulty. Return of clear pink urine. Flushed with minimal clots. No contradictions from Urology about starting different anticoagulation for Afib. Will conduct TOV 6/28.   - Conduct TOV today - See Urology note from 6/21 if TOV fails on 6/28             -Remove all 30cc from balloon prior to foley removal for TOV             -If fails TOV -> place 19Fr coude catheter and Lidojet -> if unable to place -> page SRU and have bladder scan volume -If catheter stops draining: okay to flush with normal saline (60cc via a catheter tip syringe, please break seal from foley drainage bag). If unable to flush, please contact the Urology consult pager.  -Continue perineal pressure x 72 hours with towels compressing perineum    STABLE PROBLEMS:  #Depression: Continue home Fluoxetine  40 mg daily  #ETOH: Reportedly drinks 12 beers per day; unknown last drink. At this time no signs of withdrawal #COPD Tobacco abuse: Not on home O2 or COPD medications. Nicotine  patch ordered. Smoking cessation counseling consulted #RLL adenocarcinoma s/p SBRT 2024 #GERD: continue home pantoprazole  40 mg BID (  on Nexium here) #HFpEF: 02/28/22 echo with EF 60-65%. Stable at repeat TTE this admission #Afib: Reportedly taking Warfarin, however INR at OSH 1.1. Plan to restart warfarin or DOAC when able as above    RESOLVED PROBLEMS: # Hypertensive emergency (resolved): Systolic blood pressure 219 at OSH, started on nicardipine drip transport. Stable off nicardipine drip since 6/21. - Continue Losartan  50mg  (increased 6/22)    # Discharge Planning:  - Case  management: consulted. Recommendations appreciated. - Social work: N/A - PT: 5xH - OT: 5xH - SLP: lvl 6/soft and bite size solids and lvl 0/thin liquids - PM&R: recommended AIR - Expected Discharge Disposition: Acute inpatient rehabilitation. - Follow-up appointments with  PM&R Stroke Nikita Harrington  .  # Checklist: - Diet: Enteral nutrition via NGT and PO diet per SLP (see above)  - IV fluids: no - Bowel Regimen: Senna and Miralax Last BM Date: 08/17/23 - GI PPX: On Nexium - DVT PPX: Lovenox  40 mg [place] daily  -Lines/Access: PIV X2 -Foley: Yes (see above)  Patient Lines/Drains/Airways Status     Active Active Lines, Drains, & Airways     Name Placement date Placement time Site Days   NG/OG Tube Right nostril 08/10/23  1100  Right nostril  7   Urethral Catheter Latex 08/11/23  1200  Latex  6   Peripheral IV 08/10/23 Anterior;Right Forearm 08/10/23  0800  Forearm  7            This patient was seen and discussed with Dr. Naomi, who agrees with the above assessment and plan.    Please page Neurology Team A at [phone] for any questions/concerns.  Delon Seeds, MD PGY-1 Physical Medicine and Rehabilitation   Data Review:   Contact Information: Family contact: Biswell,Carolyn [phone]  PCP: Pcp, None Per Patient  Medications: Scheduled medications: Scheduled Medications[1] Continuous infusions: Infusions Meds[2] PRN medications: PRN Medications[3]  24 hour vital signs: Temp:  [36.4 C (97.5 F)-36.7 C (98.1 F)] 36.5 C (97.7 F) Pulse:  [56-74] 63 Resp:  [16-18] 18 BP: (118-159)/(51-73) 159/73 MAP (mmHg):  [71-92] 92 SpO2:  [98 %-100 %] 99 %  Ins and Outs: No intake/output data recorded.  Imaging and relevant diagnostic studies reviewed, pertinent findings as in above assessment and plan.       [1] . aspirin   81 mg Enteral tube: gastric Daily  . atorvastatin   40 mg Enteral tube: gastric Daily  . enoxaparin  (LOVENOX ) injection  40 mg Subcutaneous  Nightly  . esomeprazole  40 mg Enteral tube: gastric BID  . FLUoxetine   40 mg Enteral tube: gastric Daily  . losartan   50 mg Enteral tube: gastric Daily  . multivitamins (ADULT)  1 tablet Enteral tube: gastric Daily  . polyethylene glycol  17 g Enteral tube: gastric BID  . senna  2 tablet Enteral tube: gastric BID  [2] [3] acetaminophen , ipratropium-albuterol , ondansetron  **OR** ondansetron 

## 2023-08-19 NOTE — Progress Notes (Signed)
 ------------------------------------------------------------------------------- Attestation signed by Naomi Ku, MD at 08/19/23 1234 Attending Physician Attestation: I saw and evaluated the patient on 08/19/2023. I discussed the findings, assessment, and plan with Dr. Ethlyn and agree with the findings and plan as documented in the resident's progress note except for the corrections/additions noted below.  Briefly, the patient is a 73 year old male with a reported past medical history of stroke with residual left hemiparesis, left eye blindness, atrial fibrillation on warfarin (although subtherapeutic on presentation), COPD, heart failure with preserved ejection fraction, right lower lobe adenocarcinoma s/p radiation, who presented with right hemiparesis and aphasia and is s/p thrombectomy with TICI 3 after 5 passes.  Patient reportedly had no adverse overnight events and he denies any complaints.  On exam, he is awake and alert.  Speech output continues to improve.  He continues to have global aphasia, but comprehension also seems to be improving and he seems to require fewer cues although he will  still approximate many appendicular commands and he will have nonsensical speech in extended conversation.  He has a left gaze preference, but can cross midline.  He has right lower facial weakness.  He is antigravity in all 4 extremities although with mild right hemiparesis.   Plan -ASA for now, but plan to transition to anticoagulation for atrial fibrillation after calorie count and assessment as to whether he needs a PEG for nutrition. -Goal blood pressure is gradual reduction to eventual goal of normotension.  Avoid sudden drops or hypotension.  Continue losartan  -Goal LDL<70.  Atorvastatin  for goal LDL<70 -Hemoglobin A1c is within goal<7 -Telemetry -Trial of void passed -Patient accidentally pulled his NG tube.  Continues to eat well.  Continue calorie count through weekend per  nutrition -PT/OT/ST  [name], MD, PhD [facility] Department of Neurology -------------------------------------------------------------------------------   Neurology Inpatient Team A [facility]) Daily Progress Note   Patient: Gregory Lane Code Status:  Orders Placed This Encounter  Procedures  . Full code    Standing Status:   Standing    Number of Occurrences:   1   Level of Care: Acute floor status.  LOS: 9 days    Overnight Events & Subjective:   No acute overnight events. Passed TOV and foley out. Accidentally pulled out NGT.  NGT out due to doing a PO trial anyway. Speech still dysarthric but comprehension better this AM. Eating well and doing a PO trial and holding cyclic TF.  Family to bring dentures or meal time today.    Physical Exam:   General Exam: General Appearance:Well appearing. In no acute distress. HEENT: Sclera anicteric without injection. Oropharyngeal membranes are moist with no erythema or exudate. Neck: Deferred. Lungs: Normal work of breathing. Occasional wet cough, crackles and wheezes heard anteriorly. Heart: Regular rate and rhythm. Abdomen: Nondistended. Mild bleeding around foley Extremities: No edema noticed on exam.  Neurological Exam: Mental Status: Patient's speech getting better each day. Able to respond with 3-4 word phrases. Comprehension is much improved, able to follow all commands. Really tried to repeat phrase no ifs ands and buts but slurring is still present. Still a little disoriented to place.   Cranial Nerves: Pupils are reactive and patient is alert. Slight right lower facial droop noted. Motor Exam: RUE: 4+/5 grossly throughout. LUE: 5/5 grossly throughout. RLE: 5-/5 grossly throughout. LLE: 5/5 grossly throughout. Reflexes: DTRs are 1+ and symmetric throughout. Toes are downgoing bilaterally. Sensory: Deferred , responds to touch stimuli Cerebellar/Coordination/Gait: Unable to assess due to mental status. No ataxia on  witnessed movements.     Assessment/Plan:   Assessment: Gregory Lane is a 73 y.o. male with a pertinent past medical history of stroke with residual left-sided weakness, blindness left eye, A-fib on warfarin, COPD, HFpEF, HTN, current smoker, ethanol abuse, RLL adenocarcinoma s/p SBRT (2024) who was admitted to [facility] on 08/10/2023 for thrombectomy for left MCA occlusion .  ACTIVE PROBLEMS:  Ischemic stroke MCA territory s/p thrombectomy: LKN 08/09/23 15: 00. NIHSS was 11 on presentation to [facility] with points for global aphasia, right facial droop, right-sided weakness. Did not receive thrombolytics. Left M1/M2 occlusion on CTA.  Status post mechanical thrombectomy on 6/20, TICI 3 after 5 passes. Etiology could include cardioembolic in the setting of subtherapeutic INR vs athlerosclerotic in the setting of demonstrated intracranial disease.    Workup: Resulted: - CT head: No evidence of acute infarct - CTA/CTP head occlusion left M1/M2 with 87 mm penumbra in the left MCA territory and no core - CTA neck: 70% right ICA stenosis, 60% left ICA stenosis.  Possible 4 mm aneurysm at the left MCA bifurcation - PVL carotids: R ICA stenosis 50-69%, L ICA stenosis less than 50% - A1c 4.9% - LDL 53 - TTE: LVEF >60%, mild left atrial enlargement - MRI brain:  Remonstrated left MCA territory infarct primarily involving the parietal lobe.  - PT/OT post-discharge recs: 5x weekly, high intensity - PM&R: recommended AIR, referral received and pt under review.   Plan: - Continue PO diet per SLP: lvl 6 soft and bite sized food and  lvl 0 thin liquids, following calory count for 2 days - Aspirin  81 mg daily (6/21), plan to transition to warfarin or DOAC pending nutrition re-evaluation on 7/1 - Continue Telemetry - Atorvastatin  changed from 80 mg daily to 40 mg daily as last LDL was 53 (6/20)   Dysphagia/ FEN/ Nutrition:  Initial consult with SLP on 6/23 recommended pureed diet with no liquids and having NGT for  PO meds. Had MBSS done on 6/24 and recommended soft and bite-sized solids and thin liquids. Nutrition consulted on 6/25 and recommended enteral nutrition with calorie count. Patient able to have increased PO intake, re-assessment from nutrition recommended holding cyclic tube feeds via NGT and doing PO trial over the weekend. Patient accidentally pulled out NGT on 6/28 but team left NGT out due to patient having good PO intake and doing PO trial over weekend. Will reach out to nutrition on 7/1 for reassessment.  -Hold cyclic TF over weekend for PO trial -Will reach out to nutrition again on 7/1 for further recommendations  STABLE PROBLEMS:  #Depression: Continue home Fluoxetine  40 mg daily  #ETOH: Reportedly drinks 12 beers per day; unknown last drink. At this time no signs of withdrawal #COPD Tobacco abuse: Not on home O2 or COPD medications. Nicotine  patch ordered. Smoking cessation counseling consulted #RLL adenocarcinoma s/p SBRT 2024 #GERD: continue home pantoprazole  40 mg BID (on Nexium here) #HFpEF: 02/28/22 echo with EF 60-65%. Stable at repeat TTE this admission #Afib: Reportedly taking Warfarin, however INR at OSH 1.1. Plan to restart warfarin or DOAC when able as above   RESOLVED PROBLEMS: # Traumatic foley catheter removal (resolved): Urology re-placed foley 6/21 20F coude without difficulty. Return of clear pink urine. Flushed with minimal clots. No contradictions from Urology about starting different anticoagulation for Afib. Will conduct TOV 6/28.   - Passed TOV  # Hypertensive emergency (resolved): Systolic blood pressure 219 at OSH, started on nicardipine drip transport. Stable off nicardipine drip since 6/21. -  Continue Losartan  50mg  (increased 6/22)    # Discharge Planning:  - Case management: consulted. Recommendations appreciated. - Social work: N/A - PT: 5xH - OT: 5xH - SLP: lvl 6/soft and bite size solids and lvl 0/thin liquids - PM&R: recommended AIR - Expected  Discharge Disposition: Acute inpatient rehabilitation. - Follow-up appointments with  PM&R Stroke Nikita Harrington  .  # Checklist: - Diet: Enteral nutrition via NGT and PO diet per SLP (see above)  - IV fluids: no - Bowel Regimen: Senna and Miralax Last BM Date: 08/18/23 - GI PPX: On Nexium - DVT PPX: Lovenox  40 mg [place] daily  -Lines/Access: PIV X2 -Foley: Yes (see above)  Patient Lines/Drains/Airways Status     Active Active Lines, Drains, & Airways     Name Placement date Placement time Site Days   NG/OG Tube Right nostril 08/10/23  1100  Right nostril  8   Peripheral IV 08/10/23 Anterior;Right Forearm 08/10/23  0800  Forearm  9            This patient was seen and discussed with Dr. Naomi, who agrees with the above assessment and plan.    Please page Neurology Team A at [phone] for any questions/concerns.  Delon Seeds, MD PGY-1 Physical Medicine and Rehabilitation   Data Review:   Contact Information: Family contact: Alcock,Carolyn [phone]  PCP: Pcp, None Per Patient  Medications: Scheduled medications: Scheduled Medications[1] Continuous infusions: Infusions Meds[2] PRN medications: PRN Medications[3]  24 hour vital signs: Temp:  [36.5 C (97.7 F)-36.8 C (98.2 F)] 36.5 C (97.7 F) Pulse:  [51-66] 62 Resp:  [16-20] 16 BP: (119-155)/(56-71) 142/71 MAP (mmHg):  [76-91] 91 SpO2:  [97 %-100 %] 98 %  Ins and Outs: I/O this shift: In: -  Out: 400 [Urine:400]  Imaging and relevant diagnostic studies reviewed, pertinent findings as in above assessment and plan.       [1] . aspirin   81 mg Oral Daily  . atorvastatin   40 mg Oral Daily  . enoxaparin  (LOVENOX ) injection  40 mg Subcutaneous Nightly  . esomeprazole  40 mg Oral BID  . FLUoxetine   40 mg Oral Daily  . losartan   50 mg Oral Daily  . multivitamins (ADULT)  1 tablet Oral Daily  . polyethylene glycol  17 g Oral BID  . senna  2 tablet Oral BID  [2] [3] acetaminophen ,  ipratropium-albuterol , ondansetron  **OR** ondansetron 

## 2023-08-20 NOTE — Progress Notes (Signed)
 Neurology Inpatient Team A [facility]) Daily Progress Note   Patient: Gregory Lane Code Status:  Orders Placed This Encounter  Procedures  . Full code    Standing Status:   Standing    Number of Occurrences:   1   Level of Care: Acute floor status.  LOS: 10 days    Overnight Events & Subjective:   No acute overnight events. Eating well, doing PO trial.    Physical Exam:   General Exam: General Appearance:Well appearing. In no acute distress. HEENT: Sclera anicteric without injection.  Neck: Deferred. Lungs: Normal work of breathing, possible crackles, no concern for volume overload Heart: Warm, well perfused Abdomen: Nondistended.  Extremities: No edema noticed on exam.  Neurological Exam: Mental Status: Speech is dysarthric, ability to formulate one-to word sentences waxes and wanes.  Cranial Nerves: Ocular motion intact with smooth pursuit. No nystagmus.  Motor Exam: RUE: 5/5 to deltoids. LUE: 5/5 to deltoids. RLE: 5/5 to knee extensions LLE: 5/5 to knee extensions Reflexes: Deferred Sensory: Deferred , responds to touch stimuli Cerebellar/Coordination/Gait: Unable to assess due to mental status. No ataxia on witnessed movements.     Assessment/Plan:   Assessment: JARQUAVIOUS FENTRESS is a 73 y.o. male with a pertinent past medical history of stroke with residual left-sided weakness, blindness left eye, A-fib on warfarin, COPD, HFpEF, HTN, current smoker, ethanol abuse, RLL adenocarcinoma s/p SBRT (2024) who was admitted to [facility] on 08/10/2023 for thrombectomy for left MCA occlusion .  ACTIVE PROBLEMS:  Acute Ischemic stroke in the L MCA territory s/p thrombectomy (TICI 3): LKN 08/09/23 15: 00. NIHSS was 11 on presentation to [facility] with points for global aphasia, right facial droop, right-sided weakness. Did not receive thrombolytics. Left M1/M2 occlusion on CTA.  Status post mechanical thrombectomy on 6/20, TICI 3 after 5 passes. Etiology could include cardioembolic in the  setting of subtherapeutic INR (was on warfarin previously) vs athlerosclerotic disease.    Workup: Resulted: - CT head: No evidence of acute infarct - CTA/CTP head occlusion left M1/M2 with 87 mm penumbra in the left MCA territory and no core - CTA neck: 70% right ICA stenosis, 60% left ICA stenosis.  Possible 4 mm aneurysm at the left MCA bifurcation - PVL carotids: R ICA stenosis 50-69%, L ICA stenosis less than 50% - A1c 4.9% - LDL 53 - TTE: LVEF >60%, mild left atrial enlargement - MRI brain:  Remonstrated left MCA territory infarct primarily involving the parietal lobe.  - PT/OT post-discharge recs: 5x weekly, high intensity - PM&R: recommended AIR, referral received and pt under review.   Plan: - Continue PO diet per SLP: lvl 6 soft and bite sized food and  lvl 0 thin liquids, following calory count for 2 days - started eliquis  5 mg BID on 6/30 - Continue Telemetry - Atorvastatin  changed from 80 mg daily to 40 mg daily as last LDL was 53 (6/20)   Dysphagia/ FEN/ Nutrition:  Initial consult with SLP on 6/23 recommended pureed diet with no liquids and having NGT for PO meds. Had MBSS done on 6/24 and recommended soft and bite-sized solids and thin liquids. Nutrition consulted on 6/25 and recommended enteral nutrition with calorie count. Patient able to have increased PO intake, re-assessment from nutrition recommended holding cyclic tube feeds via NGT and doing PO trial over the weekend. Patient accidentally pulled out NGT on 6/28 but team left NGT out due to patient having good PO intake and doing PO trial over weekend. Will reach out to  nutrition on 7/1 for reassessment.  - Level 6 soft and bite sized solids and Level 0 thin liquids, continue Ensure Plus High Protein TID with meals, Weekly weights  Asymptomatic R ICA stenosis (Moderate)  -Outpatient vascular referral   STABLE PROBLEMS:  #Depression: Continue home Fluoxetine  40 mg daily  #ETOH: Reportedly drinks 12 beers per day;  unknown last drink. At this time no signs of withdrawal #COPD Tobacco abuse: Not on home O2 or COPD medications. Nicotine  patch ordered. Smoking cessation counseling consulted #RLL adenocarcinoma s/p SBRT 2024 #GERD: continue home pantoprazole  40 mg BID (on Nexium here) #HFpEF: 02/28/22 echo with EF 60-65%. Stable at repeat TTE this admission #Afib: Reportedly taking Warfarin, however INR at OSH 1.1. Plan to restart warfarin or DOAC when able as above   RESOLVED PROBLEMS: # Traumatic foley catheter removal (resolved): Urology re-placed foley 6/21 84F coude without difficulty. Return of clear pink urine. Flushed with minimal clots. No contradictions from Urology about starting different anticoagulation for Afib. Will conduct TOV 6/28.   - Passed TOV  # Hypertensive emergency (resolved): Systolic blood pressure 219 at OSH, started on nicardipine drip transport. Stable off nicardipine drip since 6/21. - Continue Losartan  50mg  (increased 6/22)    # Discharge Planning:  - Case management: consulted. Recommendations appreciated. - Social work: N/A - PT: 5xH - OT: 5xH - SLP: lvl 6/soft and bite size solids and lvl 0/thin liquids - PM&R: recommended AIR - Expected Discharge Disposition: Acute inpatient rehabilitation. - Follow-up appointments with  PM&R Stroke Nikita Harrington  .  # Checklist: - Diet: Enteral nutrition via NGT and PO diet per SLP (see above)  - IV fluids: no - Bowel Regimen: Senna and Miralax Last BM Date: 08/18/23 - GI PPX: On Nexium - DVT PPX: Lovenox  40 mg [place] daily  -Lines/Access: PIV X2 -Foley: passed TOV  Patient Lines/Drains/Airways Status     Active Active Lines, Drains, & Airways     Name Placement date Placement time Site Days   Peripheral IV 08/10/23 Anterior;Right Forearm 08/10/23  0800  Forearm  9            This patient was seen and discussed with Dr. Trudy, who agrees with the above assessment and plan.    Please page Neurology Team A at  [phone] for any questions/concerns.  Rae Band, MD Neurology PGY 1  Attending Physician Attestation: I saw the patient with the Resident. I discussed the findings, assessment, and plan with the Resident and agree with the findings and plan as documented in the Resident's note. I personally reviewed all relevant imaging and agree with the findings as outlined above.   Dena Earnie Trudy, DO [facility] Department of Neurology    Data Review:   Contact Information: Family contact: Issa,Carolyn [phone]  PCP: Pcp, None Per Patient  Medications: Scheduled medications: Scheduled Medications[1] Continuous infusions: Infusions Meds[2] PRN medications: PRN Medications[3]  24 hour vital signs: Temp:  [36.5 C (97.7 F)-36.7 C (98.1 F)] 36.7 C (98.1 F) Pulse:  [55-65] 62 Resp:  [16-18] 18 BP: (115-158)/(55-71) 158/70 MAP (mmHg):  [71-97] 97 SpO2:  [97 %-99 %] 99 %  Ins and Outs: No intake/output data recorded.  Imaging and relevant diagnostic studies reviewed, pertinent findings as in above assessment and plan.       [1] . aspirin   81 mg Oral Daily  . atorvastatin   40 mg Oral Daily  . enoxaparin  (LOVENOX ) injection  40 mg Subcutaneous Nightly  . esomeprazole  40 mg Oral BID  .  FLUoxetine   40 mg Oral Daily  . losartan   50 mg Oral Daily  . multivitamins (ADULT)  1 tablet Oral Daily  . polyethylene glycol  17 g Oral BID  . senna  2 tablet Oral BID  [2] [3] acetaminophen , ipratropium-albuterol , ondansetron  **OR** ondansetron 

## 2023-08-21 ENCOUNTER — Ambulatory Visit: Payer: Self-pay | Admitting: Internal Medicine

## 2023-08-21 NOTE — Discharge Summary (Signed)
 NMA (Neurology Team A) Physician Discharge Summary     Admit date: 08/10/2023 Discharge date: 08/21/2023 Discharge to: Home with Home Health Discharge Service: NEU Neurology Team A (NMA) Discharge Physician: Reda Earnie Pouch, DO Code Status: Full Code   Discharge Diagnoses:  Principal Problem:   Stroke    (POA: Yes) Active Problems:   Tobacco use disorder (POA: Yes)   Hypertension (POA: Yes)   Depression (POA: Yes)   COPD (chronic obstructive pulmonary disease)    (POA: Yes)   Heart failure    (POA: Yes)   GERD (gastroesophageal reflux disease) (POA: Yes) Resolved Problems:   * No resolved hospital problems. *  Follow-up Issues:  Follow-up appointment with Neurology requested Follow-up with PCP within 1-2 weeks of discharge for post-hospital check in. Patient's primary care provider: Pcp, None Per Patient Follow up with vascular surgery for asymptomatic R ICA stenosis (moderate), already established and last seen June 2024 (re-establish)   Pending Test Results: None  Hospital Course:   BERNABE DORCE is a 73 y.o. male with a pertinent past medical history of stroke with residual left-sided weakness, blindness left eye, A-fib on warfarin, COPD, HFpEF, HTN, current smoker, ethanol abuse, RLL adenocarcinoma s/p SBRT (2024) who was admitted to [facility] on 08/10/2023 for thrombectomy for left MCA occlusion .  # Ischemic stroke MCA territory s/p thrombectomy: LKN 08/09/23 15: 00. NIHSS was 11 on presentation to [facility] with points for global aphasia, right facial droop, right-sided weakness. Did not receive thrombolytics. Left M1/M2 occlusion on CTA.  Status post mechanical thrombectomy on 6/20, TICI 3 after 5 passes. Etiology could include cardioembolic in the setting of subtherapeutic INR vs athlerosclerotic in the setting of demonstrated intracranial disease. PT/OT recommended AIR however denied from rehabilitation center within his network, discussed with our therapists and patient's  family with ultimate decision to discharge home with home health, patient reportedly has rollator walker and shower chair at home, family confirmed they will be present for support and observation.    Workup: - CT head: No evidence of acute infarct - CTA/CTP head occlusion left M1/M2 with 87 mm penumbra in the left MCA territory and no core - CTA neck: 70% right ICA stenosis, 60% left ICA stenosis.  Possible 4 mm aneurysm at the left MCA bifurcation - PVL carotids: R ICA stenosis 50-69%, L ICA stenosis less than 50% - A1c 4.9% - LDL 53 - TTE: LVEF >60%, mild left atrial enlargement - MRI brain:  Remonstrated left MCA territory infarct primarily involving the parietal lobe.  - PT/OT post-discharge recs: 5x weekly, high intensity  Plan: - Continue PO diet per SLP: lvl 6 soft and bite sized food and  lvl 0 thin liquids - Continue eliquis  5 mg TID on 6/30 - Continue Telemetry - Atorvastatin  changed from 80 mg daily to 40 mg daily as last LDL was 53 (6/20)   Dysphagia/ FEN/ Nutrition:  Initial consult with SLP on 6/23 recommended pureed diet with no liquids and having NGT for PO meds. Had MBSS done on 6/24 and recommended soft and bite-sized solids and thin liquids. Was able to have adequate PO intake this admission per nutrition consultation.    STABLE PROBLEMS:  #Depression: Continue home Fluoxetine  40 mg daily  #ETOH: Reportedly drinks 12 beers per day; unknown last drink. No signs of withdrawal #COPD Tobacco abuse: Not on home O2 or COPD medications. Nicotine  patch ordered. Smoking cessation counseling consulted #GERD: antoprazole 40 mg BID #HFpEF: 02/28/22 echo with EF 60-65%. Stable repeat  TTE this admission #Afib: Reportedly taking Warfarin, however INR at OSH 1.1. [facility] plan as above.   RESOLVED PROBLEMS: # Traumatic foley catheter removal (resolved): Urology re-placed foley 6/21 60F coude without difficulty. Return of clear pink urine. Flushed with minimal clots. No contradictions from  Urology about starting different anticoagulation for Afib. Conducted TOV on 6/28 and passed.   # Hypertensive emergency (resolved): Systolic blood pressure 219 at OSH, started on nicardipine drip transport. Stable off nicardipine drip since 6/21. Was put on losartan  50mg  on 6/22 for maintenance of pressure.  -continue losartan  50mg  daily    The patient's hospitalization has been complicated by the following clinically significant conditions requiring additional evaluation and treatment or having a significant effect of this patient's care: - Age related debility POA requiring additional resources: DME, PT, or OT - Chronic Diastolic Heart Failure POA requiring further investigation, treatment, or monitoring  Body mass index is 24.62 kg/m.       Code Status: Full Code  Consults: IP CONSULT TO STROKE COORDINATOR IP CONSULT TO TOBACCO CESSATION PROGRAM IP CONSULT TO CASE MANAGEMENT IP CONSULT TO NUTRITION SERVICES IP CONSULT TO UROLOGY IP CONSULT TO PHYSICAL MEDICINE REHAB   Procedures: None  Discharge Exam:  Condition at Discharge: fair Temp:  [36.4 C (97.5 F)-37 C (98.6 F)] 36.6 C (97.9 F) Pulse:  [58-68] 61 Resp:  [16-18] 18 BP: (116-149)/(50-80) 116/57 SpO2:  [98 %-99 %] 99 %  General Exam: General Appearance: In no acute distress. HEENT: Head is atraumatic and normocephalic. Sclera anicteric without injection. Oropharyngeal membranes are moist with no erythema or exudate. Neck: Supple. Lungs: Normal work of breathing. No wheezes or crackles. Bilateral rhonchi Heart: Regular rate and rhythm. No murmurs, rubs, or gallops. Abdomen: Soft, nontender, nondistended. Extremities: No clubbing, cyanosis, or edema.  Neurological Exam: Mental Status: Continued dysarthric speech. Able to comprehend one step commands. Ability to formulate one-to word sentences waxes and wanes. SABRA GCS 12. The patient follows commands to do the following: Lift arms and legs. Speech with moderate  dysarthria. Names and repeats with some difficulty due to moderate dysarthria and some impaired comprehension.  Cranial Nerves: Unable to properly assess due to dysarthria and impaired comprehension Motor Exam: Normal bulk. RUE: 5/5 to deltoids. LUE: 5/5 to deltoids. RLE: 5/5 to knee extensions LLE: 5/5 to knee extensions No tremors, myoclonus, or other adventitious movement.  Reflexes:  Deferred.  Sensory exam:  Deferred, previously responds to touch stimuli  Cerebellar/Coordination/Gait: Unable to assess due to mental status. No ataxia on witnessed movements.   NIHSS on discharge: (1a.) Level of Consciousness:  0 = Alert; keenly responsive  (1b.) LOC Questions:  2 = Answers neither question correctly  (1c.) LOC Commands:  2 = Performs neither task correctly  (2.)   Best Gaze:  0 = Normal  (3.)   Visual:  0 = No visual loss  (4.)   Facial Palsy:  1 = Minor paralysis (flattened nasolabial fold, asymmetric on smiling)  (5a.) Motor Arm, Left:  0 = No drift, limb holds 90 (or 45) degrees for full 10 seconds  (5b.) Motor Arm, Right:  0 = No drift, limb holds 90 (or 45) degrees for full 10 seconds  (6a.) Motor Leg, Left:  0 = No drift, limb holds 90 (or 45) degrees for full 5 seconds  (6b.) Motor Leg, Right:  0 = No drift, limb holds 90 (or 45) degrees for full 5 seconds  (7.)   Limb Ataxia:  0 = Absent  (8.)  Sensory:  0 = Normal; no sensory loss  (9.)   Best Language:  1 = Mild to moderate aphasia, some obvious loss of fluency or facility of comprehension without significant limitation on ideas expressed or form of expression  (10.) Dysarthria:  2 = Severe; patient speech is so slurred as to be unitelligible in the absence of or our proportion to any dysphagia, or is mute/anarthric  (11.) Extinction and Inattention:  0 = No abnormality  NIHSS Total Score:  8   Admission NIHSS: 11 Discharge NIHSS: 8 Admission GCS: 10 Discharge GCS: 12 Modified Rankin Scale on discharge: 3 =  Moderate disability; need for assistance with some instrumental ADLs (e.g. cooking, household chores, finances), but able to perform basic ADLs (e.g. eating, using toilet, daily hygiene, walking) without assistance  Patient discharged on antithrombotic agent? yes Patient discharged on anticoagulation? yes Patient discharged on statin? yes PHQ-9 Score:   Patient discharged on antidepressant? yes  Pertinent Test Results:  Relevant Studies/Imaging: FL Barium Swallow Modified  Final Result  1.  No laryngeal penetration or tracheal aspiration on today's examination.  2.  Moderate residue following the swallow.    Please see full speech and language pathology note for further evaluation.    MRI Brain Wo Contrast  Final Result    1.  Remonstrated left MCA territory infarct primarily involving the parietal lobe.  2.  Infarct extends into the left insular cortex. Associated hemosiderin staining likely related to blood products visualized on same day head CT.    XR Abdomen 1 View  Final Result  Esophagogastric tube with tip and sideport projecting over the stomach.        CT Head Wo Contrast  Final Result  Evolution of left MCA territory infarct, with foci of hyperattenuating material within the brain parenchyma. Findings may represent retained contrast from thrombectomy procedure or hemorrhagic transformation. Can consider repeat CT head with dual energy scanner, as clinically warranted.          ++++++++++++++++++++    The findings of this study were discussed via telephone with DR. [facility] JOHNSON by Dr. Donnice Alexander on 08/11/2023 10:59 AM.    -----------------------------------------------    Echocardiogram W Colorflow Spectral Doppler With Contrast  Final Result    PVL Carotid Duplex Bilateral  Final Result    XR Abdomen 1 View  Final Result  Enteric tube with tip project over the left upper quadrant likely in the stomach. The sideport is likely at the level of  the GE junction. 3-5 cm advancement is recommended for better positioning.  Nonobstructive bowel gas pattern of the partially visualized abdomen. Partially seen mild colonic stool burden.    CT Neuro Comparison Of Outside Film  Final Result    Ir Intracranial Thrombectomy And/Or Thrombolysis  Final Result       Relevant Labs: All Labs Last 24hrs:  Recent Results (from the past 24 hours)  Basic Metabolic Panel   Collection Time: 08/21/23  6:25 AM  Result Value Ref Range   Sodium 142 135 - 145 mmol/L   Potassium 4.2 3.4 - 4.8 mmol/L   Chloride 106 98 - 107 mmol/L   CO2 28.0 20.0 - 31.0 mmol/L   Anion Gap 8 5 - 14 mmol/L   BUN 25 (H) 9 - 23 mg/dL   Creatinine 9.34 (L) 9.26 - 1.18 mg/dL   BUN/Creatinine Ratio 38    eGFR CKD-EPI (2021) Male >90 >=60 mL/min/1.90m2   Glucose 103 70 - 179 mg/dL  Calcium  8.8 8.7 - 10.4 mg/dL  CBC   Collection Time: 08/21/23  6:25 AM  Result Value Ref Range   WBC 10.2 3.6 - 11.2 10*9/L   RBC 3.40 (L) 4.26 - 5.60 10*12/L   HGB 11.8 (L) 12.9 - 16.5 g/dL   HCT 65.5 (L) 60.9 - 51.9 %   MCV 101.3 (H) 77.6 - 95.7 fL   MCH 34.6 (H) 25.9 - 32.4 pg   MCHC 34.2 32.0 - 36.0 g/dL   RDW 86.0 87.7 - 84.7 %   MPV 7.6 6.8 - 10.7 fL   Platelet 306 150 - 450 10*9/L   and Vascular Risk Factors:  Lab Results  Component Value Date   A1C 4.9 08/10/2023   CHOL 101 08/10/2023   LDL 53 08/10/2023   HDL 44 08/10/2023   TRIG 29 08/10/2023   TSH 1.537 08/10/2023     Discharge Medications:   Your Medication List     ASK your doctor about these medications    atorvastatin  40 MG tablet Commonly known as: LIPITOR Take 1 tablet (40 mg total) by mouth daily.   celecoxib  200 MG capsule Commonly known as: CeleBREX  Take 1 capsule (200 mg total) by mouth two (2) times a day.   FLUoxetine  20 MG capsule Commonly known as: PROZAC  Take 2 capsules (40 mg total) by mouth daily.   pantoprazole  40 MG tablet Commonly known as: Protonix  Take 1 tablet (40 mg total) by  mouth Two (2) times a day (30 minutes before a meal).        Discharge Instructions:      Resources and Referrals   Stroke Resources  Immediately after a stroke, as a patient or caregiver it is normal to feel overwhelmed, fearful and uncertain.  It is important for patients and caregivers to seek emotional care and support to cope and help adjust to new roles and responsibilities.  Knowing what resources are available for both patients and their caregivers and taking advantage of them is important.  In addition to the information you received while in the hospital, online patient and caregiver resources are also available.  Visit the American Stroke Association (ASA) website at www.strokeassocation.org for more information.  If you do not have a computer or access to the internet, you may call the ASA at 1-800-4-STROKE to get more information.   Talking with someone who understands the impact of stroke can also be very helpful.  The Stroke Family Warmline is staffed by people who have personal experience with stroke.  It is available by calling 1-888-4-STROKE.   Local support groups are also available free of charge:  [facility] Stroke Support Group - [address] [facility] [place] - Dagoberto RONAL Hasten [email or web address] or [phone] (2nd Wednesday of the Month from 1-2pm at the [facility] for Rehabilitation Care)   [facility] - Aphasia Support Group - [address] [place], [place] [address] - Belma Schimke & Burnard Rummer [phone] (2nd Wednsday [facility] Center at [place]) [place] [facility] - [address] [place], [place] Zada Chancy at [phone] (2nd Thursday 1pm @ [facility] - sponsored by [employer])  You may find you need additional support to return to independent living and/or work.  The [place]  Division of Vocational Rehabilitation Services provides counseling, training, education, transportation, job  placement, assistive technology, home modifications and other support services.  These services are provided to people with new physical and/or cognitive  difficulties to assist them with living independently and/or returning to work. You can call the main number below and they will link you to your local office for an evaluation:     Main Office    [address] [place], [place] [address]    Telephone: [phone]    Toll Free: [phone]    [phone] Consulting civil engineer)      Vocational Rehabilitation Local Offices:       http://www.cox-reed.biz/      If you are a caregiver and feel like you are unable to care for your loved one, contact your healthcare provider for assistance.  You may also use the [facility] provider Locator for assistance with finding the right resources for your loved one.  Visit [employer].co.nz for more information. Caregiving is a tough job.  Asking for help is a sign of strength and you need to take the time to care for your needs as well as those of your loved one.  Seek respite care.  For resources in your area, search www.eldercare.gov or use the [employer] [facility] [employer].WizardLinks.co.za.  You may also contact your healthcare provider for more information.   As a caregiver, if you or your loved one are experiencing signs of depression (little interest/pleasure in doing things, feeling down, depressed or hopeless), contact your healthcare provider.  If you or your loved one are experiencing feelings of hurting self, call 9-1-1 immediately.          I spent greater than 30 minutes in the discharge of this patient.  Delon Seeds, MD Physical Medicine and Rehabilitation PGY-1   Attending Physician Attestation: On the day of discharge, I saw and evaluated the patient, participating in the key portions of the service on the day of discharge.  I reviewed the resident's note  and agree with the discharge plans and disposition. I personally spent less than 30 minutes in discharge planning services.   Dena Earnie Pouch, DO [facility] Department of Neurology

## 2023-08-21 NOTE — Progress Notes (Signed)
 Neurology Inpatient Team A [facility]) Daily Progress Note   Patient: Gregory Lane Code Status:  Orders Placed This Encounter  Procedures  . Full code    Standing Status:   Standing    Number of Occurrences:   1   Level of Care: Acute floor status.  LOS: 11 days    Overnight Events & Subjective:   No acute overnight events. Eating well. Nutrition following with continued calorie count.    Physical Exam:   General Exam: General Appearance:Well appearing. In no acute distress. HEENT: Sclera anicteric without injection.  Neck: Deferred. Lungs: Normal work of breathing, possible crackles, no concern for volume overload Heart: Warm, well perfused Abdomen: Nondistended.  Extremities: No edema noticed on exam.  Neurological Exam: Mental Status: Continued dysarthric speech. Able to comprehend one step commands.  Ability to formulate one-to word sentences waxes and wanes.  Cranial Nerves: Ocular motion intact with smooth pursuit. No nystagmus.  Motor Exam: RUE: 5/5 to deltoids. LUE: 5/5 to deltoids. RLE: 5/5 to knee extensions LLE: 5/5 to knee extensions Reflexes: Deferred Sensory: Deferred , responds to touch stimuli Cerebellar/Coordination/Gait: Unable to assess due to mental status. No ataxia on witnessed movements.     Assessment/Plan:   Assessment: Gregory Lane is a 73 y.o. male with a pertinent past medical history of stroke with residual left-sided weakness, blindness left eye, A-fib on warfarin, COPD, HFpEF, HTN, current smoker, ethanol abuse, RLL adenocarcinoma s/p SBRT (2024) who was admitted to [facility] on 08/10/2023 for thrombectomy for left MCA occlusion .  ACTIVE PROBLEMS:  Acute Ischemic stroke in the L MCA territory s/p thrombectomy (TICI 3): LKN 08/09/23 15: 00. NIHSS was 11 on presentation to [facility] with points for global aphasia, right facial droop, right-sided weakness. Did not receive thrombolytics. Left M1/M2 occlusion on CTA.  Status post mechanical thrombectomy on  6/20, TICI 3 after 5 passes. Etiology could include cardioembolic in the setting of subtherapeutic INR (was on warfarin previously) vs athlerosclerotic disease.    Workup: Resulted: - CT head: No evidence of acute infarct - CTA/CTP head occlusion left M1/M2 with 87 mm penumbra in the left MCA territory and no core - CTA neck: 70% right ICA stenosis, 60% left ICA stenosis.  Possible 4 mm aneurysm at the left MCA bifurcation - PVL carotids: R ICA stenosis 50-69%, L ICA stenosis less than 50% - A1c 4.9% - LDL 53 - TTE: LVEF >60%, mild left atrial enlargement - MRI brain:  Remonstrated left MCA territory infarct primarily involving the parietal lobe.  - PT/OT post-discharge recs: 5x weekly, high intensity - PM&R: recommended AIR, referral received and pt under review.   Plan: - Continue PO diet per SLP: lvl 6 soft and bite sized food and  lvl 0 thin liquids, following calory count for 2 days - started eliquis  5 mg BID on 6/30 - Continue Telemetry - Atorvastatin  changed from 80 mg daily to 40 mg daily as last LDL was 53 (6/20)   Dysphagia/ FEN/ Nutrition:  Initial consult with SLP on 6/23 recommended pureed diet with no liquids and having NGT for PO meds. Had MBSS done on 6/24 and recommended soft and bite-sized solids and thin liquids. Nutrition consulted on 6/25 and recommended enteral nutrition with calorie count. Patient able to have increased PO intake, re-assessment from nutrition recommended holding cyclic tube feeds via NGT and doing PO trial over the weekend. Patient accidentally pulled out NGT on 6/28 but team left NGT out due to patient having good PO intake.  Patient continues to have calorie count done per recs by nutrition on 6/30.  - Level 6 soft and bite sized solids and Level 0 thin liquids, continue Ensure Plus High Protein TID with meals, Weekly weights -Continue calorie count  Asymptomatic R ICA stenosis (Moderate)  -Outpatient vascular referral   STABLE PROBLEMS:   #Depression: Continue home Fluoxetine  40 mg daily  #ETOH: Reportedly drinks 12 beers per day; unknown last drink. At this time no signs of withdrawal #COPD Tobacco abuse: Not on home O2 or COPD medications. Nicotine  patch ordered. Smoking cessation counseling consulted #RLL adenocarcinoma s/p SBRT 2024 #GERD: continue home pantoprazole  40 mg BID (on Nexium here) #HFpEF: 02/28/22 echo with EF 60-65%. Stable at repeat TTE this admission #Afib: Reportedly taking Warfarin, however INR at OSH 1.1. Plan to restart warfarin or DOAC when able as above   RESOLVED PROBLEMS: # Traumatic foley catheter removal (resolved): Urology re-placed foley 6/21 69F coude without difficulty. Return of clear pink urine. Flushed with minimal clots. No contradictions from Urology about starting different anticoagulation for Afib. Will conduct TOV 6/28.   - Passed TOV  # Hypertensive emergency (resolved): Systolic blood pressure 219 at OSH, started on nicardipine drip transport. Stable off nicardipine drip since 6/21. - Continue Losartan  50mg  (increased 6/22)    # Discharge Planning:  - Case management: consulted. Recommendations appreciated. - Social work: N/A - PT: 5xH - OT: 5xH - SLP: lvl 6/soft and bite size solids and lvl 0/thin liquids - PM&R: recommended AIR - Expected Discharge Disposition: Acute inpatient rehabilitation. - Follow-up appointments with  PM&R Stroke Nikita Harrington  .  # Checklist: - Diet: Enteral nutrition via NGT and PO diet per SLP (see above)  - IV fluids: no - Bowel Regimen: Senna and Miralax Last BM Date: 08/20/23 - GI PPX: On Nexium - DVT PPX: Lovenox  40 mg Mayo daily  -Lines/Access: PIV X2 -Foley: passed TOV  Patient Lines/Drains/Airways Status     Active Active Lines, Drains, & Airways     None            This patient was seen and discussed with Dr. Trudy, who agrees with the above assessment and plan.    Please page Neurology Team A at [phone] for any  questions/concerns.  Delon Seeds, MD Physical Medicine and Rehabilitation PGY-1  Attending Physician Attestation: I saw the patient with the Resident. I discussed the findings, assessment, and plan with the Resident and agree with the findings and plan as documented in the Resident's note.   Dena Earnie Trudy, DO [facility] Department of Neurology     Data Review:   Contact Information: Family contact: Koppelman,Carolyn [phone]  PCP: Pcp, None Per Patient  Medications: Scheduled medications: Scheduled Medications[1] Continuous infusions: Infusions Meds[2] PRN medications: PRN Medications[3]  24 hour vital signs: Temp:  [36.4 C (97.5 F)-37 C (98.6 F)] 36.6 C (97.9 F) Pulse:  [58-68] 58 Resp:  [16-18] 18 BP: (124-152)/(50-80) 142/80 MAP (mmHg):  [69-98] 98 SpO2:  [98 %-99 %] 98 %  Ins and Outs: No intake/output data recorded.  Imaging and relevant diagnostic studies reviewed, pertinent findings as in above assessment and plan.       [1] . apixaban   5 mg Oral BID  . atorvastatin   40 mg Oral Daily  . esomeprazole  40 mg Oral BID  . FLUoxetine   40 mg Oral Daily  . losartan   50 mg Oral Daily  . multivitamins (ADULT)  1 tablet Oral Daily  . polyethylene glycol  17 g  Oral BID  . senna  2 tablet Oral BID  [2] [3] acetaminophen , ipratropium-albuterol , ondansetron  **OR** ondansetron 

## 2023-08-22 ENCOUNTER — Emergency Department
Admission: EM | Admit: 2023-08-22 | Discharge: 2023-08-22 | Disposition: A | Discharge status: Home / Self Care | Attending: Emergency Medicine | Admitting: Emergency Medicine

## 2023-08-22 ENCOUNTER — Encounter: Payer: Self-pay | Admitting: Emergency Medicine

## 2023-08-22 ENCOUNTER — Other Ambulatory Visit: Payer: Self-pay

## 2023-08-22 DIAGNOSIS — I69354 Hemiplegia and hemiparesis following cerebral infarction affecting left non-dominant side: Secondary | ICD-10-CM | POA: Diagnosis not present

## 2023-08-22 DIAGNOSIS — I4891 Unspecified atrial fibrillation: Secondary | ICD-10-CM | POA: Diagnosis not present

## 2023-08-22 DIAGNOSIS — R369 Urethral discharge, unspecified: Secondary | ICD-10-CM | POA: Insufficient documentation

## 2023-08-22 DIAGNOSIS — J449 Chronic obstructive pulmonary disease, unspecified: Secondary | ICD-10-CM | POA: Insufficient documentation

## 2023-08-22 DIAGNOSIS — Z7901 Long term (current) use of anticoagulants: Secondary | ICD-10-CM | POA: Insufficient documentation

## 2023-08-22 DIAGNOSIS — R319 Hematuria, unspecified: Secondary | ICD-10-CM | POA: Insufficient documentation

## 2023-08-22 LAB — CBC
HCT: 32.8 % — ABNORMAL LOW (ref 39.0–52.0)
Hemoglobin: 11.2 g/dL — ABNORMAL LOW (ref 13.0–17.0)
MCH: 35.3 pg — ABNORMAL HIGH (ref 26.0–34.0)
MCHC: 34.1 g/dL (ref 30.0–36.0)
MCV: 103.5 fL — ABNORMAL HIGH (ref 80.0–100.0)
Platelets: 298 10*3/uL (ref 150–400)
RBC: 3.17 MIL/uL — ABNORMAL LOW (ref 4.22–5.81)
RDW: 13.2 % (ref 11.5–15.5)
WBC: 6.9 10*3/uL (ref 4.0–10.5)
nRBC: 0 % (ref 0.0–0.2)

## 2023-08-22 LAB — URINALYSIS, ROUTINE W REFLEX MICROSCOPIC
Bacteria, UA: NONE SEEN
Bilirubin Urine: NEGATIVE
Glucose, UA: NEGATIVE mg/dL
Ketones, ur: NEGATIVE mg/dL
Nitrite: NEGATIVE
Protein, ur: 30 mg/dL — AB
RBC / HPF: >50 RBC/hpf (ref 0–5)
Specific Gravity, Urine: 1.027 (ref 1.005–1.030)
pH: 5 (ref 5.0–8.0)

## 2023-08-22 LAB — PROTIME-INR
INR: 1.2 (ref 0.8–1.2)
Prothrombin Time: 15.5 s — ABNORMAL HIGH (ref 11.4–15.2)

## 2023-08-22 LAB — COMPREHENSIVE METABOLIC PANEL WITH GFR
ALT: 42 U/L (ref 0–44)
AST: 27 U/L (ref 15–41)
Albumin: 3.2 g/dL — ABNORMAL LOW (ref 3.5–5.0)
Alkaline Phosphatase: 95 U/L (ref 38–126)
Anion gap: 9 (ref 5–15)
BUN: 28 mg/dL — ABNORMAL HIGH (ref 8–23)
CO2: 24 mmol/L (ref 22–32)
Calcium: 8.4 mg/dL — ABNORMAL LOW (ref 8.9–10.3)
Chloride: 104 mmol/L (ref 98–111)
Creatinine, Ser: 0.88 mg/dL (ref 0.61–1.24)
GFR, Estimated: >60 mL/min (ref >60)
Glucose, Bld: 121 mg/dL — ABNORMAL HIGH (ref 70–99)
Potassium: 4 mmol/L (ref 3.5–5.1)
Sodium: 137 mmol/L (ref 135–145)
Total Bilirubin: 0.7 mg/dL (ref 0.0–1.2)
Total Protein: 7 g/dL (ref 6.5–8.1)

## 2023-08-22 LAB — ETHANOL: Alcohol, Ethyl (B): <15 mg/dL (ref <15)

## 2023-08-22 NOTE — ED Notes (Signed)
 Patient was able to stand up and pee at bedside to provide UA sample.

## 2023-08-22 NOTE — ED Notes (Signed)
 Patient unable to urinate at this time. Urinal placed at bedside.

## 2023-08-22 NOTE — ED Triage Notes (Signed)
 Pt via [facility] from home. Pt has had some bleeding from home penis today, states he also has had some trouble urinating. Pt did have a foley cath when he was admitted to the hospital. Pt was admitted to the hospital on 6/20 for a CVA, pt has baseline expressive aphasia and slurred speech. Denies pain. Pt is A&Ox4 and NAD

## 2023-08-22 NOTE — ED Provider Notes (Signed)
 [facility] Provider Note    Event Date/Time   First MD Initiated Contact with Patient 08/22/23 1543     (approximate)  History   Chief Complaint: Penile Discharge (Blood)  HPI  Gregory Lane is a 73 y.o. male with a past medical history of anxiety, COPD, atrial fibrillation on warfarin, CVA with left-sided weakness, presents to the emergency department for penile bleeding.  I reviewed the patient's discharge summary from yesterday from [facility].  Patient had just been admitted for a thrombectomy on 08/10/2023 for a left MCA occlusion.  Patient had known left-sided deficits from a prior CVA but on 6/19 presented to [facility] with acute onset of right sided deficits including right facial droop and right sided weakness.  Found to have a left M1/M2 occlusion on CTA and underwent thrombectomy 6/20.  In their note they state there was a traumatic Foley catheter removal urology replaced the Foley on 6/21 without difficulty.  It appears the Foley was removed on 6/28.  Patient appears to have been discharged on Eliquis  5 mg 3 times daily  Physical Exam   Triage Vital Signs: ED Triage Vitals  Encounter Vitals Group     BP 08/22/23 1546 (!) 107/58     Girls Systolic BP Percentile --      Girls Diastolic BP Percentile --      Boys Systolic BP Percentile --      Boys Diastolic BP Percentile --      Pulse Rate 08/22/23 1546 (!) 58     Resp 08/22/23 1546 18     Temp 08/22/23 1546 98.7 F (37.1 C)     Temp Source 08/22/23 1546 Oral     SpO2 08/22/23 1546 98 %     Weight 08/22/23 1542 191 lb 12.8 oz (87 kg)     Height 08/22/23 1542 6' 1 (1.854 m)     Head Circumference --      Peak Flow --      Pain Score 08/22/23 1540 0     Pain Loc --      Pain Education --      Exclude from Growth Chart --     Most recent vital signs: Vitals:   08/22/23 1546  BP: (!) 107/58  Pulse: (!) 58  Resp: 18  Temp: 98.7 F (37.1 C)  SpO2: 98%    General: Awake, no distress.  CV:  Good  peripheral perfusion.  Regular rate and rhythm Resp:  Normal effort.  Equal breath sounds bilaterally.  Abd:  No distention.  Soft, nontender.  No rebound or guarding. Other:  Uncircumcised penis, bleeding appears to be from the meatus however difficult exam as the foreskin is not fully retractable.  No current bleeding.   ED Results / Procedures / Treatments   MEDICATIONS ORDERED IN ED: Medications - No data to display   IMPRESSION / MDM / ASSESSMENT AND PLAN / ED COURSE  I reviewed the triage vital signs and the nursing notes.  Patient's presentation is most consistent with acute presentation with potential threat to life or bodily function.  Patient presents emergency department for bleeding from the penis.  Patient has a documented traumatic Foley removal and replacement on 6/21, very likely the cause of the bleeding.  Patient was then started on Eliquis  which very likely has caused more bleeding.  Hemostatic currently.  Will check labs we will continue to closely monitor.  Lab work is reassuring.  Hemoglobin 11.2.  Patient's chemistry shows no  significant finding.  Urinalysis does show greater than 50 red cells with 11-20 white cells but no bacteria.  Will send a urine culture as a precaution.  Highly suspect that the patient had urethral trauma on due to the traumatic Foley dislodgment on the 21st, now that the catheter has been removed on the 28th and he is on Eliquis  I suspect that is the area that is causing the bleeding.  However patient otherwise appears well.  I discussed with the patient to monitor his urine at home and if the bleeding does not appear to be resolving over the next 2 days or so he should return to the emergency department.  If the bleeding worsens he should return to the emergency department and if he is unable to urinate he needs to return to the emergency department.  Otherwise we will refer to urology for further evaluation.  Patient agreeable to plan.  FINAL  CLINICAL IMPRESSION(S) / ED DIAGNOSES   Hematuria  Note:  This document was prepared using Dragon voice recognition software and may include unintentional dictation errors.   Dorothyann Drivers, MD 08/22/23 2012

## 2023-08-22 NOTE — Discharge Instructions (Signed)
 As we discussed if the urine remains bloody and does not appear to be clearing after 2 days please return to the emergency department for further evaluation.  If bleeding significantly increases or you are unable to urinate please return to the emergency department.  Otherwise please call the number provided for urology tomorrow to arrange a follow-up appointment for further evaluation and treatment.

## 2023-08-22 NOTE — ED Notes (Signed)
 Urinal placed on bed. Patient educated to use urinal when needing to urinate. Due to need for UA sample.

## 2023-08-23 NOTE — Progress Notes (Signed)
No answer and no VM set up.

## 2023-08-24 LAB — URINE CULTURE: Culture: 20000 — AB

## 2023-08-27 ENCOUNTER — Ambulatory Visit (INDEPENDENT_AMBULATORY_CARE_PROVIDER_SITE_OTHER): Admitting: Internal Medicine

## 2023-08-27 ENCOUNTER — Encounter: Payer: Self-pay | Admitting: Internal Medicine

## 2023-08-27 VITALS — BP 140/70 | HR 53 | Temp 98.6°F | Ht 73.0 in | Wt 185.0 lb

## 2023-08-27 DIAGNOSIS — R4701 Aphasia: Secondary | ICD-10-CM | POA: Diagnosis not present

## 2023-08-27 DIAGNOSIS — J441 Chronic obstructive pulmonary disease with (acute) exacerbation: Secondary | ICD-10-CM

## 2023-08-27 DIAGNOSIS — J449 Chronic obstructive pulmonary disease, unspecified: Secondary | ICD-10-CM

## 2023-08-27 DIAGNOSIS — E782 Mixed hyperlipidemia: Secondary | ICD-10-CM

## 2023-08-27 DIAGNOSIS — I10 Essential (primary) hypertension: Secondary | ICD-10-CM | POA: Diagnosis not present

## 2023-08-27 DIAGNOSIS — I6340 Cerebral infarction due to embolism of unspecified cerebral artery: Secondary | ICD-10-CM

## 2023-08-27 NOTE — Progress Notes (Signed)
 Pt will be informed at is appt today.

## 2023-08-27 NOTE — Progress Notes (Signed)
 Established Patient Office Visit  Subjective:  Patient ID: Gregory Lane, male    DOB: [birth year]  Age: 73 y.o. MRN: [id number]  Chief Complaint  Patient presents with   Follow-up    Hospital follow up    Hospital f/u for recent admission for recurrent stroke with residual dysphasia. Discharged on 6/28 but went back to the ED on 7/2 for hematuria which resolved.    No other concerns at this time.   Past Medical History:  Diagnosis Date   Anxiety    Aortic atherosclerosis (HCC) 02/16/2017   Arthritis    Blind left eye    COPD (chronic obstructive pulmonary disease) (HCC)    Depression    Dysrhythmia    A-fib   GERD (gastroesophageal reflux disease)    Hypertension    Low TSH level 02/17/2017   Myocardial infarction [facility])    PAD (peripheral artery disease) (HCC)    Paroxysmal atrial fibrillation (HCC)    Perforated duodenal ulcer (HCC)    Stroke (HCC)    left side is weak    Past Surgical History:  Procedure Laterality Date   BRONCHIAL BIOPSY  03/22/2022   Procedure: BRONCHIAL BIOPSIES;  Surgeon: Isadora Hose, MD;  Location: [facility] ENDOSCOPY;  Service: Pulmonary;;   BRONCHIAL NEEDLE ASPIRATION BIOPSY  03/22/2022   Procedure: BRONCHIAL NEEDLE ASPIRATION BIOPSIES;  Surgeon: Isadora Hose, MD;  Location: [facility] ENDOSCOPY;  Service: Pulmonary;;   ENDOBRONCHIAL ULTRASOUND  03/22/2022   Procedure: ENDOBRONCHIAL ULTRASOUND;  Surgeon: Isadora Hose, MD;  Location: [facility] ENDOSCOPY;  Service: Pulmonary;;   EXPLORATORY LAPAROTOMY  05/12/2015   EYE SURGERY     FRACTURE SURGERY     LAPAROTOMY N/A 05/10/2015   Procedure: EXPLORATORY LAPAROTOMY;  Surgeon: Lynda Leos, MD;  Location: [facility] OR;  Service: General;  Laterality: N/A;   LEFT HEART CATH AND CORONARY ANGIOGRAPHY N/A 05/02/2016   Procedure: Left Heart Cath and Coronary Angiography;  Surgeon: Gordy Bergamo, MD;  Location: [facility] INVASIVE CV LAB;  Service: Cardiovascular;  Laterality: N/A;   LOWER EXTREMITY ANGIOGRAPHY Left 09/21/2021    Procedure: Lower Extremity Angiography;  Surgeon: Jama Cordella KANDICE, MD;  Location: [facility] INVASIVE CV LAB;  Service: Cardiovascular;  Laterality: Left;    Social History   Socioeconomic History   Marital status: Single    Spouse name: Not on file   Number of children: Not on file   Years of education: Not on file   Highest education level: Not on file  Occupational History   Not on file  Tobacco Use   Smoking status: Every Day    Current packs/day: 0.50    Average packs/day: 0.5 packs/day for 65.0 years (32.5 ttl pk-yrs)    Types: Cigarettes   Smokeless tobacco: Never   Tobacco comments:    0.5 PPD  Vaping Use   Vaping status: Never Used  Substance and Sexual Activity   Alcohol use: Yes    Alcohol/week: 60.0 standard drinks of alcohol    Types: 60 Standard drinks or equivalent per week    Comment: daily 12 pk of beer   Drug use: No   Sexual activity: Not Currently  Other Topics Concern   Not on file  Social History Narrative   [place] Veteran. Lives with sister and sister-in-law   Social Drivers of Health   Financial Resource Strain: Low Risk  (08/10/2023)   Received from [facility]   Overall Financial Resource Strain (CARDIA)    How hard is it for you to pay for  the very basics like food, housing, medical care, and heating?: Not very hard  Food Insecurity: No Food Insecurity (08/10/2023)   Received from [facility]   Hunger Vital Sign    Within the past 12 months, you worried that your food would run out before you got the money to buy more.: Never true    Within the past 12 months, the food you bought just didn't last and you didn't have money to get more.: Never true  Transportation Needs: No Transportation Needs (08/10/2023)   Received from [facility] - [facility]    Lack of Transportation (Medical): No    Lack of Transportation (Non-Medical): No  Physical Activity: Inactive (01/23/2022)   Exercise Vital Sign    Days of Exercise per  Week: 0 days    Minutes of Exercise per Session: 0 min  Stress: No Stress Concern Present (09/09/2021)   [employer] of Occupational Health - Occupational Stress Questionnaire    Feeling of Stress : Only a little  Social Connections: Socially Isolated (09/09/2021)   Social Connection and Isolation Panel    Frequency of Communication with Friends and Family: More than three times a week    Frequency of Social Gatherings with Friends and Family: More than three times a week    Attends Religious Services: Never    Database administrator or Organizations: No    Attends Banker Meetings: Never    Marital Status: Never married  Intimate Partner Violence: Not At Risk (03/28/2022)   Humiliation, Afraid, Rape, and Kick questionnaire    Fear of Current or Ex-Partner: No    Emotionally Abused: No    Physically Abused: No    Sexually Abused: No    Family History  Problem Relation Age of Onset   Hypertension Mother    Dementia Mother    Cirrhosis Father    Cancer Father    Lung cancer Sister    Hypertension Sister    Arthritis Sister    Heart murmur Sister    Arrhythmia Sister     No Known Allergies  Outpatient Medications Prior to Visit  Medication Sig   atorvastatin  (LIPITOR) 40 MG tablet TAKE 1 TABLET BY MOUTH EVERY DAY   FLUoxetine  (PROZAC ) 20 MG capsule Take 2 capsules (40 mg total) by mouth daily.   losartan  (COZAAR ) 50 MG tablet Take 1 tablet (50 mg total) by mouth 2 (two) times daily.   pantoprazole  (PROTONIX ) 40 MG tablet Take 1 tablet (40 mg total) by mouth 2 (two) times daily.   cetirizine  (ZYRTEC  ALLERGY) 10 MG tablet Take 1 tablet (10 mg total) by mouth daily. (Patient not taking: Reported on 08/27/2023)   clotrimazole -betamethasone  (LOTRISONE ) cream Apply 1 Application topically 2 (two) times daily. (Patient not taking: Reported on 08/27/2023)   fluticasone  (FLONASE ) 50 MCG/ACT nasal spray Place 1 spray into both nostrils daily. (Patient not taking: Reported on  08/27/2023)   furosemide  (LASIX ) 20 MG tablet Take 20 mg by mouth daily. (Patient not taking: Reported on 08/27/2023)   KLOR-CON  M20 20 MEQ tablet Take 20 mEq by mouth 2 (two) times daily. (Patient not taking: Reported on 08/27/2023)   warfarin (COUMADIN ) 4 MG tablet Take 1 tablet (4 mg total) by mouth daily. Take 1 daily Monday  through Friday. Do not take Saturday and Sunday.Take 4 mg by mouth daily. (Patient not taking: Reported on 08/27/2023)   [DISCONTINUED] aspirin  81 MG chewable tablet Chew 81 mg by mouth once. (Patient  not taking: Reported on 08/27/2023)   [DISCONTINUED] celecoxib  (CELEBREX ) 200 MG capsule Take 1 capsule (200 mg total) by mouth 2 (two) times daily. (Patient not taking: Reported on 08/27/2023)   No facility-administered medications prior to visit.    Review of Systems  Constitutional:  Positive for weight loss (6 l bs).  HENT: Negative.    Eyes: Negative.   Respiratory: Negative.    Cardiovascular: Negative.   Gastrointestinal: Negative.   Genitourinary: Negative.   Skin:  Positive for itching and rash.  Neurological: Negative.   Endo/Heme/Allergies: Negative.        Objective:   BP (!) 140/70   Pulse (!) 53   Temp 98.6 F (37 C)   Ht 6' 1 (1.854 m)   Wt 185 lb (83.9 kg)   SpO2 97%   BMI 24.41 kg/m   Vitals:   08/27/23 1348  BP: (!) 140/70  Pulse: (!) 53  Temp: 98.6 F (37 C)  Height: 6' 1 (1.854 m)  Weight: 185 lb (83.9 kg)  SpO2: 97%  BMI (Calculated): 24.41    Physical Exam Vitals reviewed.  Constitutional:      Appearance: Normal appearance.  HENT:     Head: Normocephalic.     Left Ear: There is no impacted cerumen.     Nose: Nose normal.     Mouth/Throat:     Mouth: Mucous membranes are moist.     Pharynx: No posterior oropharyngeal erythema.  Eyes:     Extraocular Movements: Extraocular movements intact.     Pupils: Pupils are equal, round, and reactive to light.  Cardiovascular:     Rate and Rhythm: Regular rhythm.     Chest Wall:  PMI is not displaced.     Pulses: Normal pulses.     Heart sounds: Normal heart sounds. No murmur heard. Pulmonary:     Effort: Pulmonary effort is normal.     Breath sounds: Normal air entry. No rhonchi or rales.  Abdominal:     General: Abdomen is flat. Bowel sounds are normal. There is no distension.     Palpations: Abdomen is soft. There is no hepatomegaly, splenomegaly or mass.     Tenderness: There is no abdominal tenderness.  Musculoskeletal:     Left shoulder: Decreased range of motion. Decreased strength.     Cervical back: Normal range of motion and neck supple.     Right lower leg: No edema.     Left lower leg: No edema.  Skin:    General: Skin is warm and dry.  Neurological:     General: No focal deficit present.     Mental Status: He is alert and oriented to person, place, and time.     Cranial Nerves: No cranial nerve deficit.     Motor: No weakness.  Psychiatric:        Mood and Affect: Mood normal.        Behavior: Behavior normal.      No results found for any visits on 08/27/23.      Assessment & Plan:  As per problem list  Problem List Items Addressed This Visit       Cardiovascular and Mediastinum   Essential hypertension     Respiratory   COPD with acute exacerbation (HCC)   Chronic obstructive pulmonary disease (COPD) (HCC)   Other Visit Diagnoses       Aphasia    -  Primary       Return in about 2 weeks (  around 09/10/2023) for awv with labs prior.   Total time spent: 30 minutes  Sherrill Cinderella Perry, MD  08/27/2023   This document may have been prepared by [facility] Voice Recognition software and as such may include unintentional dictation errors.

## 2023-08-28 ENCOUNTER — Ambulatory Visit: Attending: Internal Medicine

## 2023-08-28 ENCOUNTER — Ambulatory Visit: Admitting: Speech Pathology

## 2023-08-28 ENCOUNTER — Telehealth: Payer: Self-pay

## 2023-08-28 DIAGNOSIS — M6281 Muscle weakness (generalized): Secondary | ICD-10-CM | POA: Insufficient documentation

## 2023-08-28 DIAGNOSIS — R4701 Aphasia: Secondary | ICD-10-CM

## 2023-08-28 DIAGNOSIS — R2681 Unsteadiness on feet: Secondary | ICD-10-CM | POA: Insufficient documentation

## 2023-08-28 DIAGNOSIS — R278 Other lack of coordination: Secondary | ICD-10-CM | POA: Insufficient documentation

## 2023-08-28 DIAGNOSIS — R482 Apraxia: Secondary | ICD-10-CM

## 2023-08-28 DIAGNOSIS — R41841 Cognitive communication deficit: Secondary | ICD-10-CM | POA: Diagnosis present

## 2023-08-28 NOTE — Therapy (Unsigned)
 OUTPATIENT SPEECH LANGUAGE PATHOLOGY APHASIA EVALUATION   Patient Name: Gregory Lane MRN: [id number] DOB:Oct 27, 1950, 73 y.o., male 44 Date: 08/28/2023  PCP: Denyse DELENA Bathe, MD REFERRING PROVIDER: Arland Earnie Pouch, MD   End of Session - 08/28/23 1240     Visit Number 1    Number of Visits 25    Date for SLP Re-Evaluation 11/20/23    Authorization Type [employer] [facility] [place]    Progress Note Due on Visit 10    SLP Start Time 1145    SLP Stop Time  1230    SLP Time Calculation (min) 45 min    Activity Tolerance Patient tolerated treatment well          Past Medical History:  Diagnosis Date   Anxiety    Aortic atherosclerosis (HCC) 02/16/2017   Arthritis    Blind left eye    COPD (chronic obstructive pulmonary disease) (HCC)    Depression    Dysrhythmia    A-fib   GERD (gastroesophageal reflux disease)    Hypertension    Low TSH level 02/17/2017   Myocardial infarction [facility])    PAD (peripheral artery disease) (HCC)    Paroxysmal atrial fibrillation (HCC)    Perforated duodenal ulcer (HCC)    Stroke (HCC)    left side is weak   Past Surgical History:  Procedure Laterality Date   BRONCHIAL BIOPSY  03/22/2022   Procedure: BRONCHIAL BIOPSIES;  Surgeon: Isadora Hose, MD;  [facility];  Service: Pulmonary;;   BRONCHIAL NEEDLE ASPIRATION BIOPSY  03/22/2022   Procedure: BRONCHIAL NEEDLE ASPIRATION BIOPSIES;  Surgeon: Isadora Hose, MD;  [facility]: [facility];  Service: Pulmonary;;   ENDOBRONCHIAL ULTRASOUND  03/22/2022   Procedure: ENDOBRONCHIAL ULTRASOUND;  Surgeon: Isadora Hose, MD;  Location: [facility];  Service: Pulmonary;;   EXPLORATORY LAPAROTOMY  05/12/2015   EYE SURGERY     FRACTURE SURGERY     LAPAROTOMY N/A 05/10/2015   Procedure: EXPLORATORY LAPAROTOMY;  Surgeon: Lynda Leos, MD;  Location: [facility] OR;  Service: General;  Laterality: N/A;   LEFT HEART CATH AND CORONARY ANGIOGRAPHY N/A 05/02/2016   Procedure: Left Heart Cath and  Coronary Angiography;  Surgeon: Gordy Bergamo, MD;  Location: [facility] INVASIVE CV LAB;  Service: Cardiovascular;  Laterality: N/A;   LOWER EXTREMITY ANGIOGRAPHY Left 09/21/2021   Procedure: Lower Extremity Angiography;  Surgeon: Jama Cordella KANDICE, MD;  Location: [facility] INVASIVE CV LAB;  Service: Cardiovascular;  Laterality: Left;   Patient Active Problem List   Diagnosis Date Noted   Allergic rhinitis due to pollen 12/11/2022   Tinea cruris 12/11/2022   BPH associated with nocturia 12/11/2022   Chest pain, non-cardiac 04/11/2022   Adenocarcinoma of lower lobe of right lung (HCC) 03/28/2022   Pulmonary nodule 1 cm or greater in diameter 03/22/2022   Leg weakness, bilateral 02/09/2022   Osteopenia 10/13/2021   PAD (peripheral artery disease) (HCC) 09/21/2021   GERD without esophagitis 09/21/2021   Dyslipidemia 09/21/2021   Depression 09/21/2021   Atrial flutter (HCC) 09/21/2021   Chronic obstructive pulmonary disease (COPD) (HCC) 09/21/2021   Coronary artery disease 09/21/2021   Cerebrovascular accident (CVA) due to embolism of cerebral artery (HCC) 09/06/2021   Bilateral edema of lower extremity 08/05/2020   Vasovagal syncope 08/02/2020   Annual physical exam 12/26/2019   Hyperlipidemia 12/26/2019   Abrasion of right hand 12/26/2019   Metabolic syndrome 11/11/2019   Low TSH level 02/17/2017   Atrial flutter with rapid ventricular response (HCC) 02/16/2017   Cigarette smoker 02/16/2017  COPD with acute exacerbation (HCC) 02/16/2017   Elevated lactic acid level 02/16/2017   Aortic atherosclerosis (HCC) 02/16/2017   Productive cough    Hypoxia 05/24/2015   SOB (shortness of breath) 05/24/2015   COPD suggested by initial evaluation (HCC) 05/17/2015   Essential hypertension 05/17/2015   Smoking 05/17/2015    ONSET DATE: 08/09/2023   REFERRING DIAG: I63.9 (ICD-10-CM) - Cerebral infarction, unspecified   THERAPY DIAG:  Aphasia  Cognitive communication deficit  Apraxia  Rationale for  Evaluation and Treatment Rehabilitation  SUBJECTIVE:   SUBJECTIVE STATEMENT: Pt arrived to session with his sister-in-law, pt unintelligible verbal communication, suspected difficulty understanding verbal communication observed Pt accompanied by: sister-in-law  PERTINENT HISTORY and DIAGNOSTIC FINDINGS: Pt is a 73 year old right handed male who presented to [facility] ED on 08/09/2023 with stroke-like symptoms - slurred speech and difficulty answering questions and weakness to his right side extremities. CT head and subsequent CTA head and neck shows left M2 occlusion with large penumbra in the region. Pt transferred to [facility] for mechanical thrombectomy on 08/10/2023. Pt admitted at [facility] from 08/10/2023 thru 08/21/2023 with recommended for home health.   MRI brain: Remonstrated left MCA territory infarct primarily involving the parietal lobe.   Additional medical history includes: blindness left eye, A-fib on warfarin, COPD, HFpEF, HTN, current smoker, ethanol abuse, RLL adenocarcinoma s/p SBRT (2024)     PAIN:  Are you having pain? Unable to communicate  FALLS: Has patient fallen in last 6 months?  No  LIVING ENVIRONMENT: Lives with: sister-in-law Lives in: House/apartment  PLOF:  Level of assistance: Independent with ADLs, Independent with IADLs, Comment: poor health literacy, difficulty with reading and writing per family report Employment: Retired   PATIENT GOALS    per pt's sister-in-law, she hopes he will talk again   OBJECTIVE:  COGNITION: Overall cognitive status: Difficulty to assess due to: Communication impairment and severity of deficits Will need further assessment as language impairment improves - able to demonstrate good attention to tasks during language evaluation   AUDITORY COMPREHENSION: Overall auditory comprehension: Impaired: simple YES/NO questions: Impaired: simple Following directions: Impaired: simple Conversation: Simple Effective technique: written  cues   READING COMPREHENSION: to able to pair written word with basic common object in field of 3  EXPRESSION: verbal  VERBAL EXPRESSION: Overall verbal expression: Impaired: simple Level of generative/spontaneous verbalization: word and phrase Automatic speech: name: impaired, social response: impaired, counting: impaired, day of week: impaired, month of year: impaired, and singing: impaired  Repetition: Impaired: Word Naming: pt unable to name any objects d/t  Pragmatics: varied throughout session Effective technique: none helpful during this evaluation Non-verbal means of communication: gestures  WRITTEN EXPRESSION: Dominant hand: right  Written expression: Impaired: word  MOTOR SPEECH: Overall motor speech: difficult to assess d/t severity of language impairment Phonation: normal Resonance: WFL Articulation: difficult to assess d/t fluent nonsensical speech Intelligibility: Intelligibility reduced Motor planning:  difficult to assess d/t severity of language impairment   ORAL MOTOR EXAMINATION Facial : Symmetry impaired: Impaired right, Suspect CN VII impairment, Strength impaired: Impaired right, Suspect CN VII impairment Lingual: Symmetry Impaired: Impaired right, Suspect CN XII impairment, Strength Impaired: Impaired right, Suspect CN XII impairment Velum: WFL Mandible: WFL Cough: WFL Voice: WFL   STANDARDIZED ASSESSMENTS:    PATIENT REPORTED OUTCOME MEASURES (PROM): {SLPPROM:27785}   TODAY'S TREATMENT:  ***   PATIENT EDUCATION: Education details: *** Person educated: {Person educated:25204} Education method: {Education Method:25205} Education comprehension: {Education Comprehension:25206}  HOME EXERCISE PROGRAM:   ***  GOALS:  Goals reviewed with patient? {yes/no:20286}  SHORT TERM GOALS: Target date: 10 sessions  *** Baseline: Goal status: {GOALSTATUS:25110}  2.  *** Baseline:  Goal status: {GOALSTATUS:25110}  3.  *** Baseline:   Goal status: {GOALSTATUS:25110}  4.  *** Baseline:  Goal status: {GOALSTATUS:25110}  5.  *** Baseline:  Goal status: {GOALSTATUS:25110}  6.  *** Baseline:  Goal status: {GOALSTATUS:25110}  LONG TERM GOALS: Target date:   *** Baseline:  Goal status: {GOALSTATUS:25110}  2.  *** Baseline:  Goal status: {GOALSTATUS:25110}  3.  *** Baseline:  Goal status: {GOALSTATUS:25110}  4.  *** Baseline:  Goal status: {GOALSTATUS:25110}  5.  *** Baseline:  Goal status: {GOALSTATUS:25110}  6.  *** Baseline:  Goal status: {GOALSTATUS:25110}  ASSESSMENT:  CLINICAL IMPRESSION: Patient is a 73 y.o. right handed male who was seen today for a speech language evaluation d/t left MCA CVA.   Pt presents with the appearance of global aphasia - potentially Wernicke's Aphasia. See below for details.  Given the severity of pt's aphasia, he is not able to communicate any basic wants/needs which (per his family's report) results in increased pt frustration and agitation.   Verbal Communication Pt's verbal communication was fluent but nonsensical speech consisting of normal rate and rhythm but didn't convey meaning. Pt was not aware of unintelligible nature of speech with few rote utterances that were intelligible. As such, pt was not able to name any basic common objects or produce rote utterances such as DOW, MOY (with written cues) or sing Happy Birthday. No ability to repeat basic words observed.   Auditory Comprehension Pt with moderately impaired auditory comprehension - given obejct name pt was able to select appropriate object in field of 2 in 4 out of 6 opportunities decreasing to 3 out of 8 opportunities with a field of 3, answered basic yes/no questions related to himself correctly in 4 out of 6 opportunities and followed basic 1-step directions related to self in 2 out of 4 opportunities.   Written Language During an unsuccessful attempt at verbal communication, he took this  writer's pencil and attempted to write x 1 but was not able to write more than two letters ET but was able to write his first name.   Reading Comprehension Pt was able to pair written word with correct object in field of 3 with 100% accuracy (15 out of 15 opportunities).    OBJECTIVE IMPAIRMENTS include expressive language, receptive language, aphasia, apraxia, and written language. These impairments are limiting patient from managing medications, managing appointments, managing finances, household responsibilities, ADLs/IADLs, and effectively communicating at home and in community. Factors affecting potential to achieve goals and functional outcome are severity of impairments, family/community support, and significantly reduced health literacy. Patient will benefit from skilled SLP services to address above impairments and improve overall function.  REHAB POTENTIAL: Good  PLAN: SLP FREQUENCY: 1-2x/week  SLP DURATION: 12 weeks  PLANNED INTERVENTIONS: Language facilitation, Cueing hierachy, Internal/external aids, Functional tasks, Multimodal communication approach, SLP instruction and feedback, Compensatory strategies, and Patient/family education    Mory Herrman B. Rubbie, M.S., CCC-SLP, Tree surgeon Certified Brain Injury Specialist [facility]  [facility] Rehabilitation Services Office [phone] Ascom [phone] Fax [phone]

## 2023-08-28 NOTE — Therapy (Signed)
 OUTPATIENT PHYSICAL THERAPY NEURO EVALUATION   Patient Name: Gregory Lane MRN: [id number] DOB:Jan 24, 1951, 73 y.o., male Today's Date: 08/28/2023   PCP: Fernand Denyse LABOR  REFERRING PROVIDER: Trudy Arland Jansky   END OF SESSION:  PT End of Session - 08/28/23 1126     Visit Number 1    Number of Visits 1    Date for PT Re-Evaluation 08/28/23    PT Start Time 1100    PT Stop Time 1126    PT Time Calculation (min) 26 min    Equipment Utilized During Treatment Gait belt    Activity Tolerance Treatment limited secondary to agitation    Behavior During Therapy Agitated          Past Medical History:  Diagnosis Date   Anxiety    Aortic atherosclerosis (HCC) 02/16/2017   Arthritis    Blind left eye    COPD (chronic obstructive pulmonary disease) (HCC)    Depression    Dysrhythmia    A-fib   GERD (gastroesophageal reflux disease)    Hypertension    Low TSH level 02/17/2017   Myocardial infarction [facility])    PAD (peripheral artery disease) (HCC)    Paroxysmal atrial fibrillation (HCC)    Perforated duodenal ulcer (HCC)    Stroke (HCC)    left side is weak   Past Surgical History:  Procedure Laterality Date   BRONCHIAL BIOPSY  03/22/2022   Procedure: BRONCHIAL BIOPSIES;  Surgeon: Isadora Hose, MD;  [facility]: [facility];  Service: Pulmonary;;   BRONCHIAL NEEDLE ASPIRATION BIOPSY  03/22/2022   Procedure: BRONCHIAL NEEDLE ASPIRATION BIOPSIES;  Surgeon: Isadora Hose, MD;  Location: [facility];  Service: Pulmonary;;   ENDOBRONCHIAL ULTRASOUND  03/22/2022   Procedure: ENDOBRONCHIAL ULTRASOUND;  Surgeon: Isadora Hose, MD;  [facility]: [facility];  Service: Pulmonary;;   EXPLORATORY LAPAROTOMY  05/12/2015   EYE SURGERY     FRACTURE SURGERY     LAPAROTOMY N/A 05/10/2015   Procedure: EXPLORATORY LAPAROTOMY;  Surgeon: Lynda Leos, MD;  Location: [facility] OR;  Service: General;  Laterality: N/A;   LEFT HEART CATH AND CORONARY ANGIOGRAPHY N/A 05/02/2016   Procedure: Left  Heart Cath and Coronary Angiography;  Surgeon: Gordy Bergamo, MD;  Location: [facility] INVASIVE CV LAB;  Service: Cardiovascular;  Laterality: N/A;   LOWER EXTREMITY ANGIOGRAPHY Left 09/21/2021   Procedure: Lower Extremity Angiography;  Surgeon: Jama Cordella KANDICE, MD;  Location: [facility] INVASIVE CV LAB;  Service: Cardiovascular;  Laterality: Left;   Patient Active Problem List   Diagnosis Date Noted   Allergic rhinitis due to pollen 12/11/2022   Tinea cruris 12/11/2022   BPH associated with nocturia 12/11/2022   Chest pain, non-cardiac 04/11/2022   Adenocarcinoma of lower lobe of right lung (HCC) 03/28/2022   Pulmonary nodule 1 cm or greater in diameter 03/22/2022   Leg weakness, bilateral 02/09/2022   Osteopenia 10/13/2021   PAD (peripheral artery disease) (HCC) 09/21/2021   GERD without esophagitis 09/21/2021   Dyslipidemia 09/21/2021   Depression 09/21/2021   Atrial flutter (HCC) 09/21/2021   Chronic obstructive pulmonary disease (COPD) (HCC) 09/21/2021   Coronary artery disease 09/21/2021   Cerebrovascular accident (CVA) due to embolism of cerebral artery (HCC) 09/06/2021   Bilateral edema of lower extremity 08/05/2020   Vasovagal syncope 08/02/2020   Annual physical exam 12/26/2019   Hyperlipidemia 12/26/2019   Abrasion of right hand 12/26/2019   Metabolic syndrome 11/11/2019   Low TSH level 02/17/2017   Atrial flutter with rapid ventricular response (HCC) 02/16/2017   Cigarette  smoker 02/16/2017   COPD with acute exacerbation (HCC) 02/16/2017   Elevated lactic acid level 02/16/2017   Aortic atherosclerosis (HCC) 02/16/2017   Productive cough    Hypoxia 05/24/2015   SOB (shortness of breath) 05/24/2015   COPD suggested by initial evaluation (HCC) 05/17/2015   Essential hypertension 05/17/2015   Smoking 05/17/2015    ONSET DATE: 08/10/23  REFERRING DIAG: CVA  THERAPY DIAG:  Unsteadiness on feet  Muscle weakness (generalized)  Rationale for Evaluation and Treatment:  Rehabilitation  SUBJECTIVE:                                                                                                                                                                                             SUBJECTIVE STATEMENT: Patient presents to PT for evaluation of CVA.  Pt accompanied by: sister   PERTINENT HISTORY: Patient underwent thrombectomy for L MCA occlusion on 08/10/23. He was discharged on 08/21/23 and returned to ED on 08/22/23 for hematuria. Patient's sister reports his talking is the primary issue, walking has returned to near baseline.  PMH includes: anxiety, aortic atherosclerosis, arthritis, blind L eye, COPD, depression, dysrhythmia, HTN, MI, PAD, A fib, previous stroke (L side)   PAIN:  Are you having pain? No  PRECAUTIONS: Fall  RED FLAGS: None   WEIGHT BEARING RESTRICTIONS: No  FALLS: Has patient fallen in last 6 months? No  LIVING ENVIRONMENT: Lives with: lives with their family Lives in: House/apartment Stairs: 4 stairs in front and back. Hand rails on both sides.  Has following equipment at home: Single point cane, Walker - 2 wheeled, and Grab bars  PLOF: Independent  PATIENT GOALS: patient reports he doesn't need PT.   OBJECTIVE:  Note: Objective measures were completed at Evaluation unless otherwise noted.  DIAGNOSTIC FINDINGS:   IMPRESSION: 1. Severely stenotic versus occluded proximal left M2 MCA. 2. Large area of correlated penumbra in the left MCA territory (87 mL). No core infarct identified. 3. Approximately 70% right and 60% left proximal ICA stenosis in the neck. 4. Approximately 4 mm outpouching arising from the left MCA bifurcation, suspicious for aneurysm that incorporates an M2 origin. Recommend attention on forthcoming catheter arteriogram.   IMPRESSION: 1. No evidence of acute intracranial abnormality. ASPECTS is 10. 2. Encephalomalacia in the anterior right temporal and anterior/inferior frontal  lobes.  COGNITION: Overall cognitive status: Impaired   SENSATION: Impacted by patient aphasia   COORDINATION: Impacted by patient cognition   MUSCLE TONE:  Mild tone RLE   POSTURE: rounded shoulders and forward head  LOWER EXTREMITY ROM:      LOWER EXTREMITY MMT:    MMT Right  Eval Left Eval  Hip flexion 4- 4+  Hip extension    Hip abduction 4- 4+  Hip adduction 4- 4+  Hip internal rotation    Hip external rotation    Knee flexion 4- 4+  Knee extension 4- 4+  Ankle dorsiflexion 3+ 4+  Ankle plantarflexion 3+ 4+  Ankle inversion    Ankle eversion    (Blank rows = not tested)   TRANSFERS: Sit to stand: CGA  Assistive device utilized: None       GAIT: Findings: Gait Characteristics: decreased arm swing- Right, decreased stance time- Right, shuffling, ataxic, poor foot clearance- Right, and poor foot clearance- Left, Distance walked: 30 ft , Assistive device utilized:None, and Level of assistance: CGA  FUNCTIONAL TESTS:  5 times sit to stand: 39.6 seconds with UE support  6 minute walk test: not performed  10 meter walk test: 32 seconds Berg Balance Scale: not performed    PATIENT SURVEYS:  none                                                                                                                              TREATMENT DATE: 08/28/23     PATIENT EDUCATION: Education details: terminated due to agitation  Person educated: Patient and Caregiver sister Education method: Explanation, Demonstration, Actor cues, and Verbal cues Education comprehension: verbalized understanding, returned demonstration, verbal cues required, and tactile cues required  HOME EXERCISE PROGRAM: Not given due to agitation   GOALS: Goals reviewed with patient? No  SHORT TERM GOALS: Target date: 08/28/23  Patient to not be picked up by PT due to patient refusal   LONG TERM GOALS: Target date: 08/28/23  Patient to not be picked up by PT due to patient refusal    ASSESSMENT:  CLINICAL IMPRESSION: Patient is a 72 y.o. male who was seen today for physical therapy evaluation and treatment for CVA. PT eval terminated due to agitation and patient combativeness. Patient does not want to participate in PT and so will not pick up patient for evaluation.   OBJECTIVE IMPAIRMENTS: Abnormal gait, decreased balance, decreased cognition, decreased knowledge of condition, difficulty walking, decreased strength, and improper body mechanics.   ACTIVITY LIMITATIONS: carrying, lifting, standing, squatting, stairs, and transfers  PARTICIPATION LIMITATIONS: meal prep, cleaning, driving, shopping, and community activity  PERSONAL FACTORS: Age, Behavior pattern, Past/current experiences, Transportation, and 3+ comorbidities: nxiety, aortic atherosclerosis, arthritis, blind L eye, COPD, depression, dysrhythmia, HTN, MI, PAD, A fib, previous stroke (L side)  are also affecting patient's functional outcome.   REHAB POTENTIAL: Poor patient agitated and combative  CLINICAL DECISION MAKING: Evolving/moderate complexity  EVALUATION COMPLEXITY: Moderate  PLAN:  PT FREQUENCY: patient declined PT  PT DURATION: other: 1 session   PLANNED INTERVENTIONS: 97164- PT Re-evaluation, 97110-Therapeutic exercises, 97530- Therapeutic activity, [id number]- Neuromuscular re-education, 97535- Self Care, and 02859- Manual therapy  PLAN FOR NEXT SESSION: patient declined further PT    Juergen Hardenbrook, PT 08/28/2023, 11:34 AM

## 2023-08-28 NOTE — Addendum Note (Signed)
 Addended by: BRYCE MARKER AHMAD on: 08/28/2023 04:46 PM   Modules accepted: Orders

## 2023-08-30 NOTE — Telephone Encounter (Signed)
 Clinical care coordinator left vm but did not say from where and did not leave callback number.

## 2023-08-31 ENCOUNTER — Telehealth: Payer: Self-pay

## 2023-08-31 NOTE — Telephone Encounter (Signed)
 [facility] called and left vm regarding home health referral, said they needed to decline at this time due to staffing. I just wanted to inform you if you wanted to send referral somewhere else? Please advise

## 2023-08-31 NOTE — Telephone Encounter (Signed)
 Spoke with patients insurance [employer], stated that the referral for the home health needs to go through [facility] [phone]. Referral sent to [facility] and advised if not received to have them call me back.

## 2023-09-04 ENCOUNTER — Encounter: Admitting: Occupational Therapy

## 2023-09-06 ENCOUNTER — Ambulatory Visit

## 2023-09-06 ENCOUNTER — Ambulatory Visit: Admitting: Speech Pathology

## 2023-09-06 DIAGNOSIS — R278 Other lack of coordination: Secondary | ICD-10-CM

## 2023-09-06 DIAGNOSIS — R482 Apraxia: Secondary | ICD-10-CM

## 2023-09-06 DIAGNOSIS — R4701 Aphasia: Secondary | ICD-10-CM

## 2023-09-06 DIAGNOSIS — R41841 Cognitive communication deficit: Secondary | ICD-10-CM

## 2023-09-06 DIAGNOSIS — M6281 Muscle weakness (generalized): Secondary | ICD-10-CM

## 2023-09-06 DIAGNOSIS — R2681 Unsteadiness on feet: Secondary | ICD-10-CM | POA: Diagnosis not present

## 2023-09-06 NOTE — Therapy (Signed)
 OUTPATIENT SPEECH LANGUAGE PATHOLOGY  SPEECH LANGUAGE TREATMENT NOTE   Patient Name: Gregory Lane MRN: [id number] DOB:Jul 05, 1950, 73 y.o., male 20 Date: 09/06/2023  PCP: Denyse DELENA Bathe, MD REFERRING PROVIDER: Arland Earnie Pouch, MD   End of Session - 09/06/23 1140     Visit Number 2    Number of Visits 25    Date for SLP Re-Evaluation 11/20/23    Authorization Type [employer] [place]    Progress Note Due on Visit 10    SLP Start Time 1100    SLP Stop Time  1140    SLP Time Calculation (min) 40 min    Activity Tolerance Treatment limited secondary to agitation          Past Medical History:  Diagnosis Date   Anxiety    Aortic atherosclerosis (HCC) 02/16/2017   Arthritis    Blind left eye    COPD (chronic obstructive pulmonary disease) (HCC)    Depression    Dysrhythmia    A-fib   GERD (gastroesophageal reflux disease)    Hypertension    Low TSH level 02/17/2017   Myocardial infarction [facility])    PAD (peripheral artery disease) (HCC)    Paroxysmal atrial fibrillation (HCC)    Perforated duodenal ulcer (HCC)    Stroke (HCC)    left side is weak   Past Surgical History:  Procedure Laterality Date   BRONCHIAL BIOPSY  03/22/2022   Procedure: BRONCHIAL BIOPSIES;  Surgeon: Isadora Hose, MD;  Location: [facility] ENDOSCOPY;  Service: Pulmonary;;   BRONCHIAL NEEDLE ASPIRATION BIOPSY  03/22/2022   Procedure: BRONCHIAL NEEDLE ASPIRATION BIOPSIES;  Surgeon: Isadora Hose, MD;  Location: [facility];  Service: Pulmonary;;   ENDOBRONCHIAL ULTRASOUND  03/22/2022   Procedure: ENDOBRONCHIAL ULTRASOUND;  Surgeon: Isadora Hose, MD;  Location: [facility]OS[facility];  Service: Pulmonary;;   EXPLORATORY LAPAROTOMY  05/12/2015   EYE SURGERY     FRACTURE SURGERY     LAPAROTOMY N/A 05/10/2015   Procedure: EXPLORATORY LAPAROTOMY;  Surgeon: Lynda Leos, MD;  Location: [facility] OR;  Service: General;  Laterality: N/A;   LEFT HEART CATH AND CORONARY ANGIOGRAPHY N/A 05/02/2016   Procedure: Left  Heart Cath and Coronary Angiography;  Surgeon: Gordy Bergamo, MD;  Location: [facility] INVASIVE CV LAB;  Service: Cardiovascular;  Laterality: N/A;   LOWER EXTREMITY ANGIOGRAPHY Left 09/21/2021   Procedure: Lower Extremity Angiography;  Surgeon: Gregory Cordella KANDICE, MD;  Location: [facility] INVASIVE CV LAB;  Service: Cardiovascular;  Laterality: Left;   Patient Active Problem List   Diagnosis Date Noted   Allergic rhinitis due to pollen 12/11/2022   Tinea cruris 12/11/2022   BPH associated with nocturia 12/11/2022   Chest pain, non-cardiac 04/11/2022   Adenocarcinoma of lower lobe of right lung (HCC) 03/28/2022   Pulmonary nodule 1 cm or greater in diameter 03/22/2022   Leg weakness, bilateral 02/09/2022   Osteopenia 10/13/2021   PAD (peripheral artery disease) (HCC) 09/21/2021   GERD without esophagitis 09/21/2021   Dyslipidemia 09/21/2021   Depression 09/21/2021   Atrial flutter (HCC) 09/21/2021   Chronic obstructive pulmonary disease (COPD) (HCC) 09/21/2021   Coronary artery disease 09/21/2021   Cerebrovascular accident (CVA) due to embolism of cerebral artery (HCC) 09/06/2021   Bilateral edema of lower extremity 08/05/2020   Vasovagal syncope 08/02/2020   Annual physical exam 12/26/2019   Hyperlipidemia 12/26/2019   Abrasion of right hand 12/26/2019   Metabolic syndrome 11/11/2019   Low TSH level 02/17/2017   Atrial flutter with rapid ventricular response (HCC) 02/16/2017  Cigarette smoker 02/16/2017   COPD with acute exacerbation (HCC) 02/16/2017   Elevated lactic acid level 02/16/2017   Aortic atherosclerosis (HCC) 02/16/2017   Productive cough    Hypoxia 05/24/2015   SOB (shortness of breath) 05/24/2015   COPD suggested by initial evaluation (HCC) 05/17/2015   Essential hypertension 05/17/2015   Smoking 05/17/2015    ONSET DATE: 08/09/2023   REFERRING DIAG: I63.9 (ICD-10-CM) - Cerebral infarction, unspecified   THERAPY DIAG:  Aphasia  Cognitive communication  deficit  Apraxia  Rationale for Evaluation and Treatment Rehabilitation  SUBJECTIVE:   PERTINENT HISTORY and DIAGNOSTIC FINDINGS: Pt is a 73 year old right handed male who [facility] to [facility] ED on 08/09/2023 with stroke-like symptoms - slurred speech and difficulty answering questions and weakness to his right side extremities. CT head and subsequent CTA head and neck shows left M2 occlusion with large penumbra in the region. [facility] thromb[facility] on 08/10/2023. Pt admitted at [facility] from 08/10/2023 thru 08/21/2023 with recommended for home health.   MRI brain: Remonstrated left MCA territory infarct primarily involving the parietal lobe.   Additional medical history includes: blindness left eye, A-fib on warfarin, COPD, HFpEF, HTN, current smoker, ethanol abuse, RLL adenocarcinoma s/p SBRT (2024)     PAIN:  Are you having pain? Unable to communicate  FALLS: Has patient fallen in last 6 months?  No  LIVING ENVIRONMENT: Lives with: sister-in-law Lives in: House/apartment  PLOF:  Level of assistance: Independent with ADLs, Independent with IADLs, Comment: poor health literacy, difficulty with reading and writing per family report Employment: Retired   PATIENT GOALS    per pt's sister-in-law, she hopes he will talk again  SUBJECTIVE STATEMENT: Pt arrived to session with his sister-in-law, pt shaking his head and frowning as he entered treatment room Pt accompanied by: sister-in-law  OBJECTIVE:   TODAY'S TREATMENT:  Skilled ST treatment session targeted pt's cognitive communication, aphasia and apraxia of speech goals. SLP facilitated session by providing the following interventions:  Pt's sister-in-law accompanied pt to session. She reports that he is able to obtain food, fix light meals, dress himself, bath, go outside perform most ADLs without any need for physical assistance for communication for help or choices. She states that it is when he is  talking to me that I can't understand and with further SLP questions it appears that his simple conversation was what she was referring to. She also states that she asked him to go to [facility] and when we passed [employer], he thought that was where we were going?' This Clinical research associate created [employer] document with colored pictures of [employer], [employer] and [employer].  Instruction provided for her to use to supplement verbal language to improve comprehension. Examples provided by pointing to [facility] when telling him that was where they were going after the session. Sister-in-law had difficulty implementing with multiple examples and opportunities. She mentions, I think he is speaking another language. I only speak some [place] so I don't understand his speech. Pt promote auditory comprehension, this writer asked pt to select from the above field of 3. He demonstrated 75% accuracy. Neologisms present when pt attempting to name given maximal support.   Poor frustration tolerance suspect d/t lack of awareness of language impairment, receptive language deficits and neologistic speech. As a result, it was difficult to engage pt in any naming or repetition tasks d/t lack of awareness. Audiofeedback of pt's speech was not effective - initially he didn't appear interested in listening to recording  but towards the endof the session he appeared to actively listen to recording and in response to neologistic speech he stated you don't know what you are doing but he didn't recognize that was himself talking.      PATIENT REPORTED OUTCOME MEASURES (PROM): To be completed over the next 3 sessions     PATIENT EDUCATION: Education details: see above Person educated: Patient and his sister-in-law Education method: Explanation Education comprehension: needs further education  HOME EXERCISE PROGRAM:   Supplement verbal information with single written words    GOALS:  Goals reviewed with patient? Yes  SHORT TERM GOALS: Target  date: 10 sessions  With maximal assistance, pt will select written word to express request for food/drink item in 7 out of 10 opportunities.  Baseline: Goal status: INITIAL  2.  With maximal multimodal assistance, pt will answer yes/no questions in 8 out of 10 opportunities.  Baseline:  Goal status: INITIAL  3.  With maximal multimodal assistance, pt will verbally approximate common object names in 5 out of 10 opportunities.  Baseline:  Goal status: INITIAL   LONG TERM GOALS: Target date: 11/20/2023  Pt will use multimodal communication and maximal assistance to communicate basic wants and needs in 2 out of 5 opportunities per pt and family report.  Baseline:  Goal status: INITIAL  2.  With Maximal A, patient/family will demonstrate understanding of the following concepts: aphasia, spontaneous recovery, communication vs conversation, strengths/strategies to promote success, local resources by answering multiple choice questions with 50% accuracy when provided supported conversation in order to increase patient and caregiver's participation in medical care.  Baseline:  Goal status: INITIAL   ASSESSMENT:  CLINICAL IMPRESSION: Patient is a 73 y.o. right handed male who was seen today for a speech language treatment d/t left MCA CVA.   Pt presents with the appearance of global aphasia - potentially Wernicke's Aphasia. See below for details.  Given the severity of pt's aphasia, he is not able to communicate any basic wants/needs which (per his family's report) results in increased pt frustration and agitation.   Verbal Communication Pt's verbal communication was fluent but nonsensical speech consisting of normal rate and rhythm but didn't convey meaning. Pt was not aware of unintelligible nature of speech with few rote utterances that were intelligible. As such, pt was not able to name any basic common objects or produce rote utterances such as DOW, MOY (with written cues) or sing Happy  Birthday. No ability to repeat basic words observed.   Auditory Comprehension Pt with moderately impaired auditory comprehension - given obejct name pt was able to select appropriate object in field of 2 in 4 out of 6 opportunities decreasing to 3 out of 8 opportunities with a field of 3, answered basic yes/no questions related to himself correctly in 4 out of 6 opportunities and followed basic 1-step directions related to self in 2 out of 4 opportunities.   Written Language During an unsuccessful attempt at verbal communication, he took this writer's pencil and attempted to write x 1 but was not able to write more than two letters ET but was able to write his first name.   Reading Comprehension Pt was able to pair written word with correct object in field of 3 with 100% accuracy (15 out of 15 opportunities).   Treatment session limited d/t poor task tolerance and pt awareness of deficits. Prognosis is guarded. See the above treatment note for details of session.   OBJECTIVE IMPAIRMENTS include expressive language, receptive language,  aphasia, apraxia, and written language. These impairments are limiting patient from managing medications, managing appointments, managing finances, household responsibilities, ADLs/IADLs, and effectively communicating at home and in community. Factors affecting potential to achieve goals and functional outcome are severity of impairments, family/community support, and significantly reduced health literacy. Patient will benefit from skilled SLP services to address above impairments and improve overall function.  REHAB POTENTIAL: Good  PLAN: SLP FREQUENCY: 1-2x/week  SLP DURATION: 12 weeks  PLANNED INTERVENTIONS: Language facilitation, Cueing hierachy, Internal/external aids, Functional tasks, Multimodal communication approach, SLP instruction and feedback, Compensatory strategies, and Patient/family education    Shloimy Michalski B. Rubbie, M.S., CCC-SLP,  Tree surgeon Certified Brain Injury Specialist [facility]  [facility] Office [phone] Ascom [phone] Fax [phone]

## 2023-09-06 NOTE — Therapy (Signed)
 OUTPATIENT OCCUPATIONAL THERAPY NEURO EVALUATION  Patient Name: JIHAAD BRUSCHI MRN: [id number] DOB:11/29/1950, 73 y.o., male Today's Date: 09/09/2023  PCP: Dr. Denyse Bathe REFERRING PROVIDER: Dr. Arland Earnie Pouch  END OF SESSION:  OT End of Session - 09/09/23 1426     Visit Number 1    Number of Visits 24    Date for OT Re-Evaluation 11/29/23    Authorization Type $50 copay per day, not per discipline    Progress Note Due on Visit 10    OT Start Time 1015    OT Stop Time 1100    OT Time Calculation (min) 45 min    Equipment Utilized During Treatment transport chair    Activity Tolerance Patient tolerated treatment well    Behavior During Therapy [facility] for tasks assessed/performed          Past Medical History:  Diagnosis Date   Anxiety    Aortic atherosclerosis (HCC) 02/16/2017   Arthritis    Blind left eye    COPD (chronic obstructive pulmonary disease) (HCC)    Depression    Dysrhythmia    A-fib   GERD (gastroesophageal reflux disease)    Hypertension    Low TSH level 02/17/2017   Myocardial infarction [facility])    PAD (peripheral artery disease) (HCC)    Paroxysmal atrial fibrillation (HCC)    Perforated duodenal ulcer (HCC)    Stroke (HCC)    left side is weak   Past Surgical History:  Procedure Laterality Date   BRONCHIAL BIOPSY  03/22/2022   Procedure: BRONCHIAL BIOPSIES;  Surgeon: Isadora Hose, MD;  Location: [facility] ENDOSCOPY;  Service: Pulmonary;;   BRONCHIAL NEEDLE ASPIRATION BIOPSY  03/22/2022   Procedure: BRONCHIAL NEEDLE ASPIRATION BIOPSIES;  Surgeon: Isadora Hose, MD;  Location: [facility] ENDOSCOPY;  Service: Pulmonary;;   ENDOBRONCHIAL ULTRASOUND  03/22/2022   Procedure: ENDOBRONCHIAL ULTRASOUND;  Surgeon: Isadora Hose, MD;  Location: [facility] ENDOSCOPY;  Service: Pulmonary;;   EXPLORATORY LAPAROTOMY  05/12/2015   EYE SURGERY     FRACTURE SURGERY     LAPAROTOMY N/A 05/10/2015   Procedure: EXPLORATORY LAPAROTOMY;  Surgeon: Lynda Leos, MD;  Location: [facility]  OR;  Service: General;  Laterality: N/A;   LEFT HEART CATH AND CORONARY ANGIOGRAPHY N/A 05/02/2016   Procedure: Left Heart Cath and Coronary Angiography;  Surgeon: Gordy Bergamo, MD;  Location: [facility] INVASIVE CV LAB;  Service: Cardiovascular;  Laterality: N/A;   LOWER EXTREMITY ANGIOGRAPHY Left 09/21/2021   Procedure: Lower Extremity Angiography;  Surgeon: Jama Cordella KANDICE, MD;  Location: [facility] INVASIVE CV LAB;  Service: Cardiovascular;  Laterality: Left;   Patient Active Problem List   Diagnosis Date Noted   Allergic rhinitis due to pollen 12/11/2022   Tinea cruris 12/11/2022   BPH associated with nocturia 12/11/2022   Chest pain, non-cardiac 04/11/2022   Adenocarcinoma of lower lobe of right lung (HCC) 03/28/2022   Pulmonary nodule 1 cm or greater in diameter 03/22/2022   Leg weakness, bilateral 02/09/2022   Osteopenia 10/13/2021   PAD (peripheral artery disease) (HCC) 09/21/2021   GERD without esophagitis 09/21/2021   Dyslipidemia 09/21/2021   Depression 09/21/2021   Atrial flutter (HCC) 09/21/2021   Chronic obstructive pulmonary disease (COPD) (HCC) 09/21/2021   Coronary artery disease 09/21/2021   Cerebrovascular accident (CVA) due to embolism of cerebral artery (HCC) 09/06/2021   Bilateral edema of lower extremity 08/05/2020   Vasovagal syncope 08/02/2020   Annual physical exam 12/26/2019   Hyperlipidemia 12/26/2019   Abrasion of right hand 12/26/2019   Metabolic syndrome  11/11/2019   Low TSH level 02/17/2017   Atrial flutter with rapid ventricular response (HCC) 02/16/2017   Cigarette smoker 02/16/2017   COPD with acute exacerbation (HCC) 02/16/2017   Elevated lactic acid level 02/16/2017   Aortic atherosclerosis (HCC) 02/16/2017   Productive cough    Hypoxia 05/24/2015   SOB (shortness of breath) 05/24/2015   COPD suggested by initial evaluation (HCC) 05/17/2015   Essential hypertension 05/17/2015   Smoking 05/17/2015   ONSET DATE: 08/10/23  REFERRING DIAG: I63.9  (ICD-10-CM) - Cerebral infarction, unspecified   THERAPY DIAG:  Muscle weakness (generalized)  Other lack of coordination  Rationale for Evaluation and Treatment: Rehabilitation  SUBJECTIVE:  SUBJECTIVE STATEMENT: Elveria reports that pt does a lot at home on his own, and she helps him as needed. Pt accompanied by: family member, Chief Operating Officer (pt's SIL)  PERTINENT HISTORY: Per chart: Hospital Course:   DELRICO MINEHART is a 73 y.o. male with a pertinent past medical history of stroke with residual left-sided weakness, blindness left eye, A-fib on warfarin, COPD, HFpEF, HTN, current smoker, ethanol abuse, RLL adenocarcinoma s/p SBRT (2024) who was admitted to [facility] on 08/10/2023 for thrombectomy for left MCA occlusion .  # Ischemic stroke MCA territory s/p thrombectomy: LKN 08/09/23 15: 00. NIHSS was 11 on presentation to [facility] with points for global aphasia, right facial droop, right-sided weakness. Did not receive thrombolytics. Left M1/M2 occlusion on CTA. Status post mechanical thrombectomy on 6/20, TICI 3 after 5 passes. Etiology could include cardioembolic in the setting of subtherapeutic INR vs athlerosclerotic in the setting of demonstrated intracranial disease. PT/OT recommended AIR however denied from rehabilitation center within his network, discussed with our therapists and patient's family with ultimate decision to discharge home with home health, patient reportedly has rollator walker and shower chair at home, family confirmed they will be present for support and observatio   PRECAUTIONS: Fall  WEIGHT BEARING RESTRICTIONS: No  PAIN: 6/10 pain on faces scale  Are you having pain? Yes: NPRS scale: N/A (limited by expressive aphasia) Pain location: R wrist and hand Pain description: unable to verbalize Aggravating factors: activity/pressure through the wrist/hand Relieving factors: rest  FALLS: Has patient fallen in last 6 months? Yes. Number of falls 1 when he had the stroke    LIVING ENVIRONMENT: Lives with: lives with their family; pt resides with his SIL, Elveria.  Elveria reports she and pt both lost their spouse's, so it's the 2 of them left. Lives in: 1 level home Stairs: 4 steps in the front and back and both have rails on both side  Has following equipment at home: cane, walker, 2 grab bars in the shower, standard toilet,   PLOF: Independent, relies on SIL for transportation   PATIENT GOALS: use the arm better and improve the speech   OBJECTIVE:  Note: Objective measures were completed at Evaluation unless otherwise noted.  HAND DOMINANCE: Right  ADLs: Overall ADLs: Elveria provides assist as needed Transfers/ambulation related to ADLs: indep without AD; occasional furniture or wall walking  Eating: starting to use R hand with utensils and eating, difficult to cut food, mild ataxia with RUE coordination hand to mouth Grooming: goes to barber for shaving, does not brush teeth (pt points to his 1 tooth) and shook his head when OT provided education on still cleaning mouth/gums/brushing to prevent bacteria growth/infection UB Dressing: extra time for buttoning.  LB Dressing: extra time for clothing fasteners Toileting: caregiver reports pt uses the wall to help with transfers; modified indep with  toileting (OT anticipates difficulty with hygiene and clothing management d/t coordination impairment). Bathing: distant supv-modified indep Tub Shower transfers: distant supv  Equipment: Grab bars  IADLs: Shopping: accompanied by SIL Light housekeeping: SIL manages Meal Prep: SIL manages; pt can manage cold meal prep Community mobility: using transport chair today to reach therapy gym Medication management: SIL sets meds in pill Chief Strategy Officer management: SIL writes the checks, currently pt unable, pt has autopay on most bills  Handwriting: Pt able to print name with fair legibility, but left letters out (writing mostly limited by language deficits  post CVA), as pt demonstrated good ability to hold pen and formulate letters.   POSTURE COMMENTS:  rounded shoulders and forward head  ACTIVITY TOLERANCE: Activity tolerance: TBD   UPPER EXTREMITY ROM:  L shoulder hx of arthritis, RUE (stroke affected side) WFL    Active ROM  Right eval Left eval  Shoulder flexion WFL 90  Shoulder abduction WFL 90  Shoulder adduction    Shoulder extension    Shoulder internal rotation WFL Substitution patterns to reach behind back  Shoulder external rotation WFL Substitution patterns to reach behind head  Middle trapezius    Lower trapezius    Elbow flexion    Elbow extension    Wrist flexion    Wrist extension    Wrist ulnar deviation    Wrist radial deviation    Wrist pronation    Wrist supination    (Blank rows = not tested)  UPPER EXTREMITY MMT:  RUE grossly 5/5 at the shoulder, elbow, wrist       L shoulder limited by arthritic pain, but able to withstand at least mild resistance from OT, L elbow, wrist grossly 5/5  HAND FUNCTION: Grip strength: Right: 56 lbs; Left: 56 lbs, Lateral pinch: Right: 16 lbs, Left: 12 lbs, and 3 point pinch: Right: 10 lbs, Left: 10 lbs  COORDINATION: Finger Nose Finger test: mild deficit RUE/decreased accuracy 9 Hole Peg test: Right: 1 min 2  sec; Left: 35 sec  SENSATION: unable to accurately assess d/t language deficits  EDEMA: No visible edema  MUSCLE TONE: RUE: Within functional limits  COGNITION: Overall cognitive status: cognitive/communication deficits: globablly aphasic (see SLP note for further details)  VISION:  Unable to accurately assess d/t cognitive/communication deficits; will continue to assess further within functional contexts Per chart: hx of blindness L eye  PERCEPTION: Not tested  PRAXIS: Impaired: Motor planning, mild-moderate RUE ataxia, mild apraxia                                                                                                                          TREATMENT DATE: 09/06/23  Evaluation completed.   PATIENT EDUCATION: Education details: OT role, goals, poc Person educated: Patient and Haematologist: Explanation Education comprehension: verbalized understanding  HOME EXERCISE PROGRAM: Will provide in upcoming sessions  GOALS: Goals reviewed with patient? Yes  SHORT TERM GOALS: Target date: 10/18/23  Pt will perform HEP for  improving RUE strength and coordination for daily tasks.  Baseline: Eval: HEP not yet initiated Goal status: INITIAL   LONG TERM GOALS: Target date: 11/29/23  Pt will increase R grip strength by 5 or more lbs in order to securely hold and carry heavy ADL supplies, and work towards assumed PLOF based on dominant/non-dominant hand comparisons.  Baseline: Eval: R dominant 56 lbs (L 56 lbs) Goal status: INITIAL  2.  Pt will increase RUE GMC/FMC skills in order to safely and independently shave face with R dominant hand. Baseline: Eval: Requires assist from barber, per SIL Goal status: INITIAL  3.  Pt will increase R hand FMC skills in order to improve efficiency with clothing fasteners, as demonstrated by completion of 9 hole peg test in 45 sec or less using R dominant hand.  Baseline: Eval: R 1 min 2 sec (L 35 sec) Goal status: INITIAL  ASSESSMENT:  CLINICAL IMPRESSION: Patient is a 73 y.o. male who was seen today for occupational therapy evaluation for functional decline related to recent CVA affecting R dominant side.  Proximal RUE strength is good.  Anticipate R grip strength is slightly less than baseline based on dominant/non-dominant comparisons.  Pt mostly impacted with decline in RUE motor skills, noting mild apraxia in the hand, and mild-moderate ataxia with fine and gross motor activities.  Pt requires multimodal cueing for evaluation activities d/t global aphasia post CVA.  Pt was evaluated by PT but evaluation was limited by pt's agitation, per chart, and pt/caregiver verbalized pt  was near baseline with mobility, so pt will be participating in OT and SLP sessions only.  Pt was cooperative with OT evaluation, though did present with limited frustration tolerance and attention to task, and also shows frustration with his communication limitations.  Pt will benefit from skilled OT with primary focus on improving RUE gross and fine motor coordination in order to increase efficiency when engaging the R dominant arm into daily tasks.    PERFORMANCE DEFICITS: in functional skills including ADLs, IADLs, coordination, dexterity, strength, Fine motor control, Gross motor control, mobility, body mechanics, decreased knowledge of precautions, decreased knowledge of use of DME, and UE functional use, cognitive skills including attention, emotional, temperament/personality, and understand, and psychosocial skills including coping strategies, environmental adaptation, habits, interpersonal interactions, and routines and behaviors.   IMPAIRMENTS: are limiting patient from ADLs, IADLs, and leisure.   CO-MORBIDITIES: has co-morbidities such as anxiety/depression/blindness L eye, HTN that affects occupational performance. Patient will benefit from skilled OT to address above impairments and improve overall function.  MODIFICATION OR ASSISTANCE TO COMPLETE EVALUATION: Min-Moderate modification of tasks or assist with assess necessary to complete an evaluation.  OT OCCUPATIONAL PROFILE AND HISTORY: Detailed assessment: Review of records and additional review of physical, cognitive, psychosocial history related to current functional performance.  CLINICAL DECISION MAKING: Moderate - several treatment options, min-mod task modification necessary  REHAB POTENTIAL: Good  EVALUATION COMPLEXITY: Moderate    PLAN:  OT FREQUENCY: 1-2x/week  OT DURATION: 12 weeks  PLANNED INTERVENTIONS: 97168 OT Re-evaluation, 97535 self care/ADL training, 02889 therapeutic exercise, 97530 therapeutic activity,  97112 neuromuscular re-education, 97140 manual therapy, 97116 gait training, 02989 moist heat, 97010 cryotherapy, 97129 Cognitive training (first 15 min), 02869 Cognitive training(each additional 15 min), 02249 Physical Performance Testing, passive range of motion, balance training, functional mobility training, visual/perceptual remediation/compensation, psychosocial skills training, energy conservation, coping strategies training, patient/family education, and DME and/or AE instructions  RECOMMENDED OTHER SERVICES: None at this time (Pt has poc in place for  SLP services)  CONSULTED AND AGREED WITH PLAN OF CARE: Patient and family Adult nurse, Elveria (SIL)  PLAN FOR NEXT SESSION: see above  Inocente Blazing, MS, OTR/L   Inocente MARLA Blazing, OT 09/09/2023, 2:28 PM

## 2023-09-11 ENCOUNTER — Ambulatory Visit

## 2023-09-11 ENCOUNTER — Telehealth: Payer: Self-pay

## 2023-09-11 ENCOUNTER — Ambulatory Visit: Admitting: Occupational Therapy

## 2023-09-11 DIAGNOSIS — M6281 Muscle weakness (generalized): Secondary | ICD-10-CM

## 2023-09-11 DIAGNOSIS — R278 Other lack of coordination: Secondary | ICD-10-CM

## 2023-09-11 DIAGNOSIS — R2681 Unsteadiness on feet: Secondary | ICD-10-CM | POA: Diagnosis not present

## 2023-09-11 NOTE — Telephone Encounter (Signed)
 [facility] called stating that they need a hand written script for the order with a  start of care date of 7/31 bc it has to be 5 days out from the day the written order is sent per the insurance

## 2023-09-11 NOTE — Therapy (Addendum)
 OUTPATIENT OCCUPATIONAL THERAPY NEURO TREATMENT  Patient Name: Gregory Lane MRN: [id number] DOB:Mar 11, 1950, 73 y.o., male Today's Date: 09/11/2023  PCP: Dr. Denyse Bathe REFERRING PROVIDER: Dr. Arland Earnie Pouch  END OF SESSION:  OT End of Session - 09/11/23 1709     Visit Number 2    Number of Visits 24    Date for OT Re-Evaluation 11/29/23    OT Start Time 1315    OT Stop Time 1400    OT Time Calculation (min) 45 min    Activity Tolerance Patient tolerated treatment well    Behavior During Therapy [facility] for tasks assessed/performed           Past Medical History:  Diagnosis Date   Anxiety    Aortic atherosclerosis (HCC) 02/16/2017   Arthritis    Blind left eye    COPD (chronic obstructive pulmonary disease) (HCC)    Depression    Dysrhythmia    A-fib   GERD (gastroesophageal reflux disease)    Hypertension    Low TSH level 02/17/2017   Myocardial infarction [facility])    PAD (peripheral artery disease) (HCC)    Paroxysmal atrial fibrillation (HCC)    Perforated duodenal ulcer (HCC)    Stroke (HCC)    left side is weak   Past Surgical History:  Procedure Laterality Date   BRONCHIAL BIOPSY  03/22/2022   Procedure: BRONCHIAL BIOPSIES;  Surgeon: Isadora Hose, MD;  Location: [facility] END[facility];  Service: Pulmonary;;   BRONCHIAL NEEDLE ASPIRATION BIOPSY  03/22/2022   Procedure: BRONCHIAL NEEDLE ASPIRATION BIOPSIES;  Surgeon: Isadora Hose, MD;  Location: [facility];  Service: Pulmonary;;   ENDOBRONCHIAL ULTRASOUND  03/22/2022   Procedure: ENDOBRONCHIAL ULTRASOUND;  Surgeon: Isadora Hose, MD;  Location: [facility];  Service: Pulmonary;;   EXPLORATORY LAPAROTOMY  05/12/2015   EYE SURGERY     FRACTURE SURGERY     LAPAROTOMY N/A 05/10/2015   Procedure: EXPLORATORY LAPAROTOMY;  Surgeon: Lynda Leos, MD;  Location: [facility] OR;  Service: General;  Laterality: N/A;   LEFT HEART CATH AND CORONARY ANGIOGRAPHY N/A 05/02/2016   Procedure: Left Heart Cath and Coronary  Angiography;  Surgeon: Gordy Bergamo, MD;  Location: [facility] INVASIVE CV LAB;  Service: Cardiovascular;  Laterality: N/A;   LOWER EXTREMITY ANGIOGRAPHY Left 09/21/2021   Procedure: Lower Extremity Angiography;  Surgeon: Jama Cordella KANDICE, MD;  Location: [facility] INVASIVE CV LAB;  Service: Cardiovascular;  Laterality: Left;   Patient Active Problem List   Diagnosis Date Noted   Allergic rhinitis due to pollen 12/11/2022   Tinea cruris 12/11/2022   BPH associated with nocturia 12/11/2022   Chest pain, non-cardiac 04/11/2022   Adenocarcinoma of lower lobe of right lung (HCC) 03/28/2022   Pulmonary nodule 1 cm or greater in diameter 03/22/2022   Leg weakness, bilateral 02/09/2022   Osteopenia 10/13/2021   PAD (peripheral artery disease) (HCC) 09/21/2021   GERD without esophagitis 09/21/2021   Dyslipidemia 09/21/2021   Depression 09/21/2021   Atrial flutter (HCC) 09/21/2021   Chronic obstructive pulmonary disease (COPD) (HCC) 09/21/2021   Coronary artery disease 09/21/2021   Cerebrovascular accident (CVA) due to embolism of cerebral artery (HCC) 09/06/2021   Bilateral edema of lower extremity 08/05/2020   Vasovagal syncope 08/02/2020   Annual physical exam 12/26/2019   Hyperlipidemia 12/26/2019   Abrasion of right hand 12/26/2019   Metabolic syndrome 11/11/2019   Low TSH level 02/17/2017   Atrial flutter with rapid ventricular response (HCC) 02/16/2017   Cigarette smoker 02/16/2017   COPD with acute exacerbation (HCC)  02/16/2017   Elevated lactic acid level 02/16/2017   Aortic atherosclerosis (HCC) 02/16/2017   Productive cough    Hypoxia 05/24/2015   SOB (shortness of breath) 05/24/2015   COPD suggested by initial evaluation (HCC) 05/17/2015   Essential hypertension 05/17/2015   Smoking 05/17/2015   ONSET DATE: 08/10/23  REFERRING DIAG: I63.9 (ICD-10-CM) - Cerebral infarction, unspecified   THERAPY DIAG:  Muscle weakness (generalized)  Other lack of coordination  Rationale for  Evaluation and Treatment: Rehabilitation  SUBJECTIVE:  SUBJECTIVE STATEMENT:   Pt./caregiver reports receiving a call that the Pt. Has an order for HHST, and reports that she told them he was receiving therapy in outpatient. However, was requesting clarification that he is receiving ST in outpatient. Pt./caregiver was notified that he currently has appointments scheduled with therapy (OT & ST) in outpatient, and would clarify this with ST.   Pt accompanied by: family member, Chief Operating Officer (pt's SIL)  PERTINENT HISTORY: Per chart: Hospital Course:   Gregory Lane is a 73 y.o. male with a pertinent past medical history of stroke with residual left-sided weakness, blindness left eye, A-fib on warfarin, COPD, HFpEF, HTN, current smoker, ethanol abuse, RLL adenocarcinoma s/p SBRT (2024) who was admitted to [facility] on 08/10/2023 for thrombectomy for left MCA occlusion .  # Ischemic stroke MCA territory s/p thrombectomy: LKN 08/09/23 15: 00. NIHSS was 11 on presentation to [facility] with points for global aphasia, right facial droop, right-sided weakness. Did not receive thrombolytics. Left M1/M2 occlusion on CTA. Status post mechanical thrombectomy on 6/20, TICI 3 after 5 passes. Etiology could include cardioembolic in the setting of subtherapeutic INR vs athlerosclerotic in the setting of demonstrated intracranial disease. PT/OT recommended AIR however denied from rehabilitation center within his network, discussed with our therapists and patient's family with ultimate decision to discharge home with home health, patient reportedly has rollator walker and shower chair at home, family confirmed they will be present for support and observatio   PRECAUTIONS: Fall  WEIGHT BEARING RESTRICTIONS: No  PAIN:   09/11/2023: Pt. Indicated 6/10 pain in the whole RUE using Wong-Baker Facial Grimace Scale. Pt. Caregiver reports that he has had pain in the right shoulder.  6/10 pain on faces scale  Are you having pain? Yes: NPRS  scale: N/A (limited by expressive aphasia) Pain location: R wrist and hand Pain description: unable to verbalize Aggravating factors: activity/pressure through the wrist/hand Relieving factors: rest  FALLS: Has patient fallen in last 6 months? Yes. Number of falls 1 when he had the stroke   LIVING ENVIRONMENT: Lives with: lives with their family; pt resides with his SIL, Elveria.  Elveria reports she and pt both lost their spouse's, so it's the 2 of them left. Lives in: 1 level home Stairs: 4 steps in the front and back and both have rails on both side  Has following equipment at home: cane, walker, 2 grab bars in the shower, standard toilet,   PLOF: Independent, relies on SIL for transportation   PATIENT GOALS: use the arm better and improve the speech   OBJECTIVE:  Note: Objective measures were completed at Evaluation unless otherwise noted.  HAND DOMINANCE: Right  ADLs: Overall ADLs: Elveria provides assist as needed Transfers/ambulation related to ADLs: indep without AD; occasional furniture or wall walking  Eating: starting to use R hand with utensils and eating, difficult to cut food, mild ataxia with RUE coordination hand to mouth Grooming: goes to barber for shaving, does not brush teeth (pt points to his 1 tooth)  and shook his head when OT provided education on still cleaning mouth/gums/brushing to prevent bacteria growth/infection UB Dressing: extra time for buttoning.  LB Dressing: extra time for clothing fasteners Toileting: caregiver reports pt uses the wall to help with transfers; modified indep with toileting (OT anticipates difficulty with hygiene and clothing management d/t coordination impairment). Bathing: distant supv-modified indep Tub Shower transfers: distant supv  Equipment: Grab bars  IADLs: Shopping: accompanied by SIL Light housekeeping: SIL manages Meal Prep: SIL manages; pt can manage cold meal prep Community mobility: using transport chair today to  reach therapy gym Medication management: SIL sets meds in pill Chief Strategy Officer management: SIL writes the checks, currently pt unable, pt has autopay on most bills  Handwriting: Pt able to print name with fair legibility, but left letters out (writing mostly limited by language deficits post CVA), as pt demonstrated good ability to hold pen and formulate letters.   POSTURE COMMENTS:  rounded shoulders and forward head  ACTIVITY TOLERANCE: Activity tolerance: TBD   UPPER EXTREMITY ROM:  L shoulder hx of arthritis, RUE (stroke affected side) WFL    Active ROM  Right eval Left eval  Shoulder flexion WFL 90  Shoulder abduction WFL 90  Shoulder adduction    Shoulder extension    Shoulder internal rotation WFL Substitution patterns to reach behind back  Shoulder external rotation WFL Substitution patterns to reach behind head  Middle trapezius    Lower trapezius    Elbow flexion    Elbow extension    Wrist flexion    Wrist extension    Wrist ulnar deviation    Wrist radial deviation    Wrist pronation    Wrist supination    (Blank rows = not tested)  UPPER EXTREMITY MMT:  RUE grossly 5/5 at the shoulder, elbow, wrist       L shoulder limited by arthritic pain, but able to withstand at least mild resistance from OT, L elbow, wrist grossly 5/5  HAND FUNCTION: Grip strength: Right: 56 lbs; Left: 56 lbs, Lateral pinch: Right: 16 lbs, Left: 12 lbs, and 3 point pinch: Right: 10 lbs, Left: 10 lbs  COORDINATION: Finger Nose Finger test: mild deficit RUE/decreased accuracy 9 Hole Peg test: Right: 1 min 2  sec; Left: 35 sec  SENSATION: unable to accurately assess d/t language deficits  EDEMA: No visible edema  MUSCLE TONE: RUE: Within functional limits  COGNITION: Overall cognitive status: cognitive/communication deficits: globablly aphasic (see SLP note for further details)  VISION:  Unable to accurately assess d/t cognitive/communication deficits; will continue to assess  further within functional contexts Per chart: hx of blindness L eye  PERCEPTION: Not tested  PRAXIS: Impaired: Motor planning, mild-moderate RUE ataxia, mild apraxia                                                                                                                         TREATMENT DATE: 09/11/23   Therapeutic Activities:  -Facilitated Alternating BUE FMC/GMC skills  in combination of functional reaching movements promoting a sustained 3 pt. Pinch in wrist/elbow extension using 3 pegs, to place into pegboard in a vertical position.  -Pt. Removed and discarded 3 pegs into container placed in multiple high and low planes.   -Pt. Placed Jumbo pegs into pegboard at tabletop surface, then progressing to placing jumbo pegs into pegboard at a vertical angle to promote wrist extension with a sustained 3 pt. Pinch. -Pt. Performed BUE Progressive gross grip strengthening with 17.9# of grip strength resistive force with gross gripper to remove jumbo pegs from jumbo pegboard in preparation to discarding pegs into container placed in multiple planes.   -Pt. Performed alternating BUE FMC/GMC skills using round tipped pegs to place into pegboard at vertical angle, placed in multiple high planes.   Neuromuscular re-education:  -Pt. Worked on BUE [facility] skills using 9 hole pegs to place into peg board with no speed component, to assess functional grasp.   Therapeutic Ex.:  -Pt. Performed Blue theraputty hand strengthening exercises including gross gripping and lateral key pinch.   PATIENT EDUCATION: Education details: Blue theraputty exercises, gross grip and 3 pt. Pinch strengthening. Person educated: Patient and Haematologist: Explanation Education comprehension: verbalized understanding  HOME EXERCISE PROGRAM: Will provide in upcoming sessions  GOALS: Goals reviewed with patient? Yes  SHORT TERM GOALS: Target date: 10/18/23  Pt will perform HEP for improving RUE  strength and coordination for daily tasks.  Baseline: Eval: HEP not yet initiated Goal status: INITIAL   LONG TERM GOALS: Target date: 11/29/23  Pt will increase R grip strength by 5 or more lbs in order to securely hold and carry heavy ADL supplies, and work towards assumed PLOF based on dominant/non-dominant hand comparisons.  Baseline: Eval: R dominant 56 lbs (L 56 lbs) Goal status: INITIAL  2.  Pt will increase RUE GMC/FMC skills in order to safely and independently shave face with R dominant hand. Baseline: Eval: Requires assist from barber, per SIL Goal status: INITIAL  3.  Pt will increase R hand FMC skills in order to improve efficiency with clothing fasteners, as demonstrated by completion of 9 hole peg test in 45 sec or less using R dominant hand.  Baseline: Eval: R 1 min 2 sec (L 35 sec) Goal status: INITIAL  ASSESSMENT:  CLINICAL IMPRESSION:  Pt. presented with a small area of dried blood on his R thumb web space, Pt. Was assisted with cleaning the dried blood from his hand. Pt. Indicated no pain from this. Pt. Was able to perform [facility] tasks in combination with elbow/wrist extension. Pt. Had difficulty sustaining gross grip using gross gripper when reaching up to higher planes to discard the pegs into a container using the RUE. Pt. Was able to place 1/2 circular  pegs into a pegboard which was placed at multiple higher planes to challenge [facility] while promoting elbow/wrist extension. Pt. Had difficulty with placing round tipped pegs into pegboard with pegboard placed at a vertical/slanted angle in frontal plane, Pt. Presented with slight aggravation when challenging the task. Pt will benefit from skilled OT with primary focus on improving RUE gross and fine motor coordination in order to increase efficiency when engaging the R dominant arm into daily tasks.    PERFORMANCE DEFICITS: in functional skills including ADLs, IADLs, coordination, dexterity, strength, Fine motor control,  Gross motor control, mobility, body mechanics, decreased knowledge of precautions, decreased knowledge of use of DME, and UE functional use, cognitive skills including attention, emotional, temperament/personality, and understand, and  psychosocial skills including coping strategies, environmental adaptation, habits, interpersonal interactions, and routines and behaviors.   IMPAIRMENTS: are limiting patient from ADLs, IADLs, and leisure.   CO-MORBIDITIES: has co-morbidities such as anxiety/depression/blindness L eye, HTN that affects occupational performance. Patient will benefit from skilled OT to address above impairments and improve overall function.  MODIFICATION OR ASSISTANCE TO COMPLETE EVALUATION: Min-Moderate modification of tasks or assist with assess necessary to complete an evaluation.  OT OCCUPATIONAL PROFILE AND HISTORY: Detailed assessment: Review of records and additional review of physical, cognitive, psychosocial history related to current functional performance.  CLINICAL DECISION MAKING: Moderate - several treatment options, min-mod task modification necessary  REHAB POTENTIAL: Good  EVALUATION COMPLEXITY: Moderate    PLAN:  OT FREQUENCY: 1-2x/week  OT DURATION: 12 weeks  PLANNED INTERVENTIONS: 97168 OT Re-evaluation, 97535 self care/ADL training, 02889 therapeutic exercise, 97530 therapeutic activity, 97112 neuromuscular re-education, 97140 manual therapy, 97116 gait training, 02989 moist heat, 97010 cryotherapy, 97129 Cognitive training (first 15 min), 02869 Cognitive training(each additional 15 min), 02249 Physical Performance Testing, passive range of motion, balance training, functional mobility training, visual/perceptual remediation/compensation, psychosocial skills training, energy conservation, coping strategies training, patient/family education, and DME and/or AE instructions  RECOMMENDED OTHER SERVICES: None at this time (Pt has poc in place for SLP  services)  CONSULTED AND AGREED WITH PLAN OF CARE: Patient and family Adult nurse, Chief Operating Officer (SIL)  PLAN FOR NEXT SESSION: see above  Damien Nap, Student-OT  This entire session was performed under direct supervision and direction of a licensed therapist/therapist assistant . I have personally read, edited and approve of the note as written.  Richardson Otter, MS, OTR/L   09/11/2023, 5:12 PM

## 2023-09-13 ENCOUNTER — Ambulatory Visit: Admitting: Speech Pathology

## 2023-09-13 ENCOUNTER — Ambulatory Visit: Admitting: Occupational Therapy

## 2023-09-13 DIAGNOSIS — R41841 Cognitive communication deficit: Secondary | ICD-10-CM

## 2023-09-13 DIAGNOSIS — R4701 Aphasia: Secondary | ICD-10-CM

## 2023-09-13 DIAGNOSIS — R482 Apraxia: Secondary | ICD-10-CM

## 2023-09-13 DIAGNOSIS — M6281 Muscle weakness (generalized): Secondary | ICD-10-CM

## 2023-09-13 DIAGNOSIS — R278 Other lack of coordination: Secondary | ICD-10-CM

## 2023-09-13 DIAGNOSIS — R2681 Unsteadiness on feet: Secondary | ICD-10-CM | POA: Diagnosis not present

## 2023-09-13 NOTE — Therapy (Addendum)
 OUTPATIENT OCCUPATIONAL THERAPY NEURO TREATMENT  Patient Name: Gregory Lane MRN: [id number] DOB:1950/06/08, 73 y.o., male Today's Date: 09/13/2023  PCP: Dr. Denyse Bathe REFERRING PROVIDER: Dr. Arland Earnie Pouch  END OF SESSION:  OT End of Session - 09/13/23 1729     Visit Number 3    Number of Visits 24    Date for OT Re-Evaluation 11/29/23    OT Start Time 1445    OT Stop Time 1530    OT Time Calculation (min) 45 min    Activity Tolerance Patient tolerated treatment well    Behavior During Therapy [facility] for tasks assessed/performed            Past Medical History:  Diagnosis Date   Anxiety    Aortic atherosclerosis (HCC) 02/16/2017   Arthritis    Blind left eye    COPD (chronic obstructive pulmonary disease) (HCC)    Depression    Dysrhythmia    A-fib   GERD (gastroesophageal reflux disease)    Hypertension    Low TSH level 02/17/2017   Myocardial infarction [facility])    PAD (peripheral artery disease) (HCC)    Paroxysmal atrial fibrillation (HCC)    Perforated duodenal ulcer (HCC)    Stroke (HCC)    left side is weak   Past Surgical History:  Procedure Laterality Date   BRONCHIAL BIOPSY  03/22/2022   Procedure: BRONCHIAL BIOPSIES;  Surgeon: Isadora Hose, MD;  Location: [facility] ENDOSCOPY;  Service: Pulmonary;;   BRONCHIAL NEEDLE ASPIRATION BIOPSY  03/22/2022   Procedure: BRONCHIAL NEEDLE ASPIRATION BIOPSIES;  Surgeon: Isadora Hose, MD;  Location: [facility] ENDOSCOPY;  Service: Pulmonary;;   ENDOBRONCHIAL ULTRASOUND  03/22/2022   Procedure: ENDOBRONCHIAL ULTRASOUND;  Surgeon: Isadora Hose, MD;  Location: [facility] ENDOSCOPY;  Service: Pulmonary;;   EXPLORATORY LAPAROTOMY  05/12/2015   EYE SURGERY     FRACTURE SURGERY     LAPAROTOMY N/A 05/10/2015   Procedure: EXPLORATORY LAPAROTOMY;  Surgeon: Lynda Leos, MD;  Location: [facility] OR;  Service: General;  Laterality: N/A;   LEFT HEART CATH AND CORONARY ANGIOGRAPHY N/A 05/02/2016   Procedure: Left Heart Cath and Coronary  Angiography;  Surgeon: Gordy Bergamo, MD;  Location: [facility] INVASIVE CV LAB;  Service: Cardiovascular;  Laterality: N/A;   LOWER EXTREMITY ANGIOGRAPHY Left 09/21/2021   Procedure: Lower Extremity Angiography;  Surgeon: Jama Cordella KANDICE, MD;  Location: [facility] INVASIVE CV LAB;  Service: Cardiovascular;  Laterality: Left;   Patient Active Problem List   Diagnosis Date Noted   Allergic rhinitis due to pollen 12/11/2022   Tinea cruris 12/11/2022   BPH associated with nocturia 12/11/2022   Chest pain, non-cardiac 04/11/2022   Adenocarcinoma of lower lobe of right lung (HCC) 03/28/2022   Pulmonary nodule 1 cm or greater in diameter 03/22/2022   Leg weakness, bilateral 02/09/2022   Osteopenia 10/13/2021   PAD (peripheral artery disease) (HCC) 09/21/2021   GERD without esophagitis 09/21/2021   Dyslipidemia 09/21/2021   Depression 09/21/2021   Atrial flutter (HCC) 09/21/2021   Chronic obstructive pulmonary disease (COPD) (HCC) 09/21/2021   Coronary artery disease 09/21/2021   Cerebrovascular accident (CVA) due to embolism of cerebral artery (HCC) 09/06/2021   Bilateral edema of lower extremity 08/05/2020   Vasovagal syncope 08/02/2020   Annual physical exam 12/26/2019   Hyperlipidemia 12/26/2019   Abrasion of right hand 12/26/2019   Metabolic syndrome 11/11/2019   Low TSH level 02/17/2017   Atrial flutter with rapid ventricular response (HCC) 02/16/2017   Cigarette smoker 02/16/2017   COPD with acute exacerbation (  HCC) 02/16/2017   Elevated lactic acid level 02/16/2017   Aortic atherosclerosis (HCC) 02/16/2017   Productive cough    Hypoxia 05/24/2015   SOB (shortness of breath) 05/24/2015   COPD suggested by initial evaluation (HCC) 05/17/2015   Essential hypertension 05/17/2015   Smoking 05/17/2015   ONSET DATE: 08/10/23  REFERRING DIAG: I63.9 (ICD-10-CM) - Cerebral infarction, unspecified   THERAPY DIAG:  Muscle weakness (generalized)  Other lack of coordination  Rationale for  Evaluation and Treatment: Rehabilitation  SUBJECTIVE:  SUBJECTIVE STATEMENT:  Pt. able to clearly verbalize a 2 word greeting upon arrival. Pt accompanied by: family member, Chief Operating Officer (pt's SIL)  PERTINENT HISTORY: Per chart: Hospital Course:   Gregory Lane is a 73 y.o. male with a pertinent past medical history of stroke with residual left-sided weakness, blindness left eye, A-fib on warfarin, COPD, HFpEF, HTN, current smoker, ethanol abuse, RLL adenocarcinoma s/p SBRT (2024) who was admitted to [facility] on 08/10/2023 for thrombectomy for left MCA occlusion .  # Ischemic stroke MCA territory s/p thrombectomy: LKN 08/09/23 15: 00. NIHSS was 11 on presentation to [facility] with points for global aphasia, right facial droop, right-sided weakness. Did not receive thrombolytics. Left M1/M2 occlusion on CTA. Status post mechanical thrombectomy on 6/20, TICI 3 after 5 passes. Etiology could include cardioembolic in the setting of subtherapeutic INR vs athlerosclerotic in the setting of demonstrated intracranial disease. PT/OT recommended AIR however denied from rehabilitation center within his network, discussed with our therapists and patient's family with ultimate decision to discharge home with home health, patient reportedly has rollator walker and shower chair at home, family confirmed they will be present for support and observatio   PRECAUTIONS: Fall  WEIGHT BEARING RESTRICTIONS: No  PAIN:   09/11/2023: Pt. Indicated 6/10 pain in the whole RUE using Wong-Baker Facial Grimace Scale. Pt. Caregiver reports that he has had pain in the right shoulder.  6/10 pain on faces scale  Are you having pain? Yes: NPRS scale: N/A (limited by expressive aphasia) Pain location: R wrist and hand Pain description: unable to verbalize Aggravating factors: activity/pressure through the wrist/hand Relieving factors: rest  FALLS: Has patient fallen in last 6 months? Yes. Number of falls 1 when he had the stroke   LIVING  ENVIRONMENT: Lives with: lives with their family; pt resides with his SIL, Elveria.  Elveria reports she and pt both lost their spouse's, so it's the 2 of them left. Lives in: 1 level home Stairs: 4 steps in the front and back and both have rails on both side  Has following equipment at home: cane, walker, 2 grab bars in the shower, standard toilet,   PLOF: Independent, relies on SIL for transportation   PATIENT GOALS: use the arm better and improve the speech   OBJECTIVE:  Note: Objective measures were completed at Evaluation unless otherwise noted.  HAND DOMINANCE: Right  ADLs: Overall ADLs: Elveria provides assist as needed Transfers/ambulation related to ADLs: indep without AD; occasional furniture or wall walking  Eating: starting to use R hand with utensils and eating, difficult to cut food, mild ataxia with RUE coordination hand to mouth Grooming: goes to barber for shaving, does not brush teeth (pt points to his 1 tooth) and shook his head when OT provided education on still cleaning mouth/gums/brushing to prevent bacteria growth/infection UB Dressing: extra time for buttoning.  LB Dressing: extra time for clothing fasteners Toileting: caregiver reports pt uses the wall to help with transfers; modified indep with toileting (OT anticipates difficulty with  hygiene and clothing management d/t coordination impairment). Bathing: distant supv-modified indep Tub Shower transfers: distant supv  Equipment: Grab bars  IADLs: Shopping: accompanied by SIL Light housekeeping: SIL manages Meal Prep: SIL manages; pt can manage cold meal prep Community mobility: using transport chair today to reach therapy gym Medication management: SIL sets meds in pill Chief Strategy Officer management: SIL writes the checks, currently pt unable, pt has autopay on most bills  Handwriting: Pt able to print name with fair legibility, but left letters out (writing mostly limited by language deficits post CVA),  as pt demonstrated good ability to hold pen and formulate letters.   POSTURE COMMENTS:  rounded shoulders and forward head  ACTIVITY TOLERANCE: Activity tolerance: TBD   UPPER EXTREMITY ROM:  L shoulder hx of arthritis, RUE (stroke affected side) WFL    Active ROM  Right eval Left eval  Shoulder flexion WFL 90  Shoulder abduction WFL 90  Shoulder adduction    Shoulder extension    Shoulder internal rotation WFL Substitution patterns to reach behind back  Shoulder external rotation WFL Substitution patterns to reach behind head  Middle trapezius    Lower trapezius    Elbow flexion    Elbow extension    Wrist flexion    Wrist extension    Wrist ulnar deviation    Wrist radial deviation    Wrist pronation    Wrist supination    (Blank rows = not tested)  UPPER EXTREMITY MMT:  RUE grossly 5/5 at the shoulder, elbow, wrist       L shoulder limited by arthritic pain, but able to withstand at least mild resistance from OT, L elbow, wrist grossly 5/5  HAND FUNCTION: Grip strength: Right: 56 lbs; Left: 56 lbs, Lateral pinch: Right: 16 lbs, Left: 12 lbs, and 3 point pinch: Right: 10 lbs, Left: 10 lbs  COORDINATION: Finger Nose Finger test: mild deficit RUE/decreased accuracy 9 Hole Peg test: Right: 1 min 2  sec; Left: 35 sec  SENSATION: unable to accurately assess d/t language deficits  EDEMA: No visible edema  MUSCLE TONE: RUE: Within functional limits  COGNITION: Overall cognitive status: cognitive/communication deficits: globablly aphasic (see SLP note for further details)  VISION:  Unable to accurately assess d/t cognitive/communication deficits; will continue to assess further within functional contexts Per chart: hx of blindness L eye  PERCEPTION: Not tested  PRAXIS: Impaired: Motor planning, mild-moderate RUE ataxia, mild apraxia                                                                                                                         TREATMENT  DATE: 09/13/23   Therapeutic Activities:  -Facilitated bilateral hand motor control focused on stabilizing the base of a cylindrical bottle with the left hand, while using the right hand to turn and open a progression of various sized medication bottles.  Self-Care  - Pt. Performed bilateral hands  coordination skills focused on buttoning a shirt, fastening a pant button,  as well as tying shoe laces placed in front of him at tabletop height.     Neuromuscular re-education:   -Facilitated BUE FMC skills grasping 1 sticks,  cylindrical collars, on the Purdue pegboard. Pt. Worked on performing a sustained grasp onto each item with the  2nd digit and thumb to place into the pegboard. Pt. worked on performing bilateral hand movements, transferring objects from R hand to L hand, following with transferring objects from L hand to R hand. -Facilitated RUE FMC tasks using the Grooved pegboard, grasping 1 grooved pegs from a horizontal position in the shallow dish, and transitioning them to a vertical position in preparation for placing them upright into the pegboard.  -To further challenge the task, Pt. Performed R hand thumb opposition to place grooved pegs into grooved pegboard. -Pt. Performed R hand alternating thumb opposition while removing grooved pegs from pegboard. -Pt. Performed R hand FMC skills grasping various sized resisitve washers from magnetic dish, in preparation of placing resisitve washers onto resistive tabletop surface.  -Pt. Used R hand to Remove resisitve washers from resisitve tabletop surface to place washers into magnetic dish to promote R hand FMC tasks. -Pt. Manipulated various sized coins from slick tabletop surface using R hand, in preparation to discard into container at tabletop surface.      PATIENT EDUCATION: Education details: Green theraputty exercises, Fmc tasks, functional tasks Person educated: Patient and Publishing rights manager method:  Explanation Education comprehension: verbalized understanding  HOME EXERCISE PROGRAM: Green theraputty exercises  GOALS: Goals reviewed with patient? Yes  SHORT TERM GOALS: Target date: 10/18/23  Pt will perform HEP for improving RUE strength and coordination for daily tasks.  Baseline: Eval: HEP not yet initiated Goal status: INITIAL   LONG TERM GOALS: Target date: 11/29/23  Pt will increase R grip strength by 5 or more lbs in order to securely hold and carry heavy ADL supplies, and work towards assumed PLOF based on dominant/non-dominant hand comparisons.  Baseline: Eval: R dominant 56 lbs (L 56 lbs) Goal status: INITIAL  2.  Pt will increase RUE GMC/FMC skills in order to safely and independently shave face with R dominant hand. Baseline: Eval: Requires assist from barber, per SIL Goal status: INITIAL  3.  Pt will increase R hand FMC skills in order to improve efficiency with clothing fasteners, as demonstrated by completion of 9 hole peg test in 45 sec or less using R dominant hand.  Baseline: Eval: R 1 min 2 sec (L 35 sec) Goal status: INITIAL  ASSESSMENT:  CLINICAL IMPRESSION:  Pt. Arrived to treatment session with his caregiver, SIL, Elveria. Pt. Caregiver reports that Pt. Has had difficulty using blue theraputty, issued green theraputty to complete theraputty exercises. Pt. Reports 6/10 pain using the Wong-Baker grimace face scale. Pt. Was able to perform R hand FMC tasks, however, required mod vc to use the R hand when twisting medicine caps off of medication bottles. Pt. Was able to perform self care tasks, while manipulating shirt and pant buttons efficiently at the tabletop surface. Pt. Required cues to stabilize the bottle base with the left hand, twisting them open with the right hand. Pt. Was efficiently able to grasp various sized coins from slick tabletop surfaces. Pt. Has difficulty with manipulating various sized resisitve washers from resisitve magnetic dish, using  the R hand. Pt. Tolerated FMC tasks using 1/2 grooved pegs, rotating the pegs in a vertical position with the 2nd digit and thumb to place into grooved pegboard. Pt. Was able to perform  thumb opposition in the R hand while removing and placing grooved pegs in pegboard, However, Pt. Presents with difficulty performing thumb opposition using the RF occasionally. Pt continues to benefit from skilled OT with primary focus on improving RUE gross and fine motor coordination in order to increase efficiency when engaging the R dominant arm into daily tasks.    PERFORMANCE DEFICITS: in functional skills including ADLs, IADLs, coordination, dexterity, strength, Fine motor control, Gross motor control, mobility, body mechanics, decreased knowledge of precautions, decreased knowledge of use of DME, and UE functional use, cognitive skills including attention, emotional, temperament/personality, and understand, and psychosocial skills including coping strategies, environmental adaptation, habits, interpersonal interactions, and routines and behaviors.   IMPAIRMENTS: are limiting patient from ADLs, IADLs, and leisure.   CO-MORBIDITIES: has co-morbidities such as anxiety/depression/blindness L eye, HTN that affects occupational performance. Patient will benefit from skilled OT to address above impairments and improve overall function.  MODIFICATION OR ASSISTANCE TO COMPLETE EVALUATION: Min-Moderate modification of tasks or assist with assess necessary to complete an evaluation.  OT OCCUPATIONAL PROFILE AND HISTORY: Detailed assessment: Review of records and additional review of physical, cognitive, psychosocial history related to current functional performance.  CLINICAL DECISION MAKING: Moderate - several treatment options, min-mod task modification necessary  REHAB POTENTIAL: Good  EVALUATION COMPLEXITY: Moderate    PLAN:  OT FREQUENCY: 1-2x/week  OT DURATION: 12 weeks  PLANNED INTERVENTIONS: 97168 OT  Re-evaluation, 97535 self care/ADL training, 02889 therapeutic exercise, 97530 therapeutic activity, 97112 neuromuscular re-education, 97140 manual therapy, 97116 gait training, 02989 moist heat, 97010 cryotherapy, 97129 Cognitive training (first 15 min), 02869 Cognitive training(each additional 15 min), 02249 Physical Performance Testing, passive range of motion, balance training, functional mobility training, visual/perceptual remediation/compensation, psychosocial skills training, energy conservation, coping strategies training, patient/family education, and DME and/or AE instructions  RECOMMENDED OTHER SERVICES: None at this time (Pt has poc in place for SLP services)  CONSULTED AND AGREED WITH PLAN OF CARE: Patient and family Adult nurse, Chief Operating Officer (SIL)  PLAN FOR NEXT SESSION: see above  Damien Nap, Student-OT  This entire session was performed under direct supervision and direction of a licensed therapist/therapist assistant . I have personally read, edited and approve of the note as written.  Richardson Otter, MS, OTR/L   09/13/2023, 5:31 PM

## 2023-09-13 NOTE — Therapy (Unsigned)
 OUTPATIENT SPEECH LANGUAGE PATHOLOGY  SPEECH LANGUAGE TREATMENT NOTE   Patient Name: Gregory Lane MRN: [id number] DOB:07/04/1950, 73 y.o., male 22 Date: 09/13/2023  PCP: Denyse DELENA Bathe, MD REFERRING PROVIDER: Arland Earnie Pouch, MD   End of Session - 09/13/23 1624     Visit Number 3    Number of Visits 25    Date for SLP Re-Evaluation 11/20/23    Authorization Type [employer] [facility] [place]    Progress Note Due on Visit 10    SLP Start Time 1530    SLP Stop Time  1615    SLP Time Calculation (min) 45 min    Activity Tolerance Patient tolerated treatment well          Past Medical History:  Diagnosis Date   Anxiety    Aortic atherosclerosis (HCC) 02/16/2017   Arthritis    Blind left eye    COPD (chronic obstructive pulmonary disease) (HCC)    Depression    Dysrhythmia    A-fib   GERD (gastroesophageal reflux disease)    Hypertension    Low TSH level 02/17/2017   Myocardial infarction [facility])    PAD (peripheral artery disease) (HCC)    Paroxysmal atrial fibrillation (HCC)    Perforated duodenal ulcer (HCC)    Stroke (HCC)    left side is weak   Past Surgical History:  Procedure Laterality Date   BRONCHIAL BIOPSY  03/22/2022   Procedure: BRONCHIAL BIOPSIES;  Surgeon: Isadora Hose, MD;  [facility]: [facility];  Service: Pulmonary;;   BRONCHIAL NEEDLE ASPIRATION BIOPSY  03/22/2022   Procedure: BRONCHIAL NEEDLE ASPIRATION BIOPSIES;  Surgeon: Isadora Hose, MD;  Location: [facility];  Service: Pulmonary;;   ENDOBRONCHIAL ULTRASOUND  03/22/2022   Procedure: ENDOBRONCHIAL ULTRASOUND;  Surgeon: Isadora Hose, MD;  Location: [facility];  Service: Pulmonary;;   EXPLORATORY LAPAROTOMY  05/12/2015   EYE SURGERY     FRACTURE SURGERY     LAPAROTOMY N/A 05/10/2015   Procedure: EXPLORATORY LAPAROTOMY;  Surgeon: Lynda Leos, MD;  Location: [facility] OR;  Service: General;  Laterality: N/A;   LEFT HEART CATH AND CORONARY ANGIOGRAPHY N/A 05/02/2016   Procedure: Left Heart  Cath and Coronary Angiography;  Surgeon: Gordy Bergamo, MD;  Location: [facility] INVASIVE CV LAB;  Service: Cardiovascular;  Laterality: N/A;   LOWER EXTREMITY ANGIOGRAPHY Left 09/21/2021   Procedure: Lower Extremity Angiography;  Surgeon: Jama Cordella KANDICE, MD;  Location: [facility] INVASIVE CV LAB;  Service: Cardiovascular;  Laterality: Left;   Patient Active Problem List   Diagnosis Date Noted   Allergic rhinitis due to pollen 12/11/2022   Tinea cruris 12/11/2022   BPH associated with nocturia 12/11/2022   Chest pain, non-cardiac 04/11/2022   Adenocarcinoma of lower lobe of right lung (HCC) 03/28/2022   Pulmonary nodule 1 cm or greater in diameter 03/22/2022   Leg weakness, bilateral 02/09/2022   Osteopenia 10/13/2021   PAD (peripheral artery disease) (HCC) 09/21/2021   GERD without esophagitis 09/21/2021   Dyslipidemia 09/21/2021   Depression 09/21/2021   Atrial flutter (HCC) 09/21/2021   Chronic obstructive pulmonary disease (COPD) (HCC) 09/21/2021   Coronary artery disease 09/21/2021   Cerebrovascular accident (CVA) due to embolism of cerebral artery (HCC) 09/06/2021   Bilateral edema of lower extremity 08/05/2020   Vasovagal syncope 08/02/2020   Annual physical exam 12/26/2019   Hyperlipidemia 12/26/2019   Abrasion of right hand 12/26/2019   Metabolic syndrome 11/11/2019   Low TSH level 02/17/2017   Atrial flutter with rapid ventricular response (HCC) 02/16/2017  Cigarette smoker 02/16/2017   COPD with acute exacerbation (HCC) 02/16/2017   Elevated lactic acid level 02/16/2017   Aortic atherosclerosis (HCC) 02/16/2017   Productive cough    Hypoxia 05/24/2015   SOB (shortness of breath) 05/24/2015   COPD suggested by initial evaluation (HCC) 05/17/2015   Essential hypertension 05/17/2015   Smoking 05/17/2015    ONSET DATE: 08/09/2023   REFERRING DIAG: I63.9 (ICD-10-CM) - Cerebral infarction, unspecified   THERAPY DIAG:  Aphasia  Apraxia  Cognitive communication  deficit  Rationale for Evaluation and Treatment Rehabilitation  SUBJECTIVE:   PERTINENT HISTORY and DIAGNOSTIC FINDINGS: Pt is a 73 year old right handed male who presented to [facility] ED on 08/09/2023 with stroke-like symptoms - slurred speech and difficulty answering questions and weakness to his right side extremities. CT head and subsequent CTA head and neck shows left M2 occlusion with large penumbra in the region. Pt transferred to [facility] for mechanical thrombectomy on 08/10/2023. Pt admitted at [facility] from 08/10/2023 thru 08/21/2023 with recommended for home health.   MRI brain: Remonstrated left MCA territory infarct primarily involving the parietal lobe.   Additional medical history includes: blindness left eye, A-fib on warfarin, COPD, HFpEF, HTN, current smoker, ethanol abuse, RLL adenocarcinoma s/p SBRT (2024)     PAIN:  Are you having pain? Unable to communicate  FALLS: Has patient fallen in last 6 months?  No  LIVING ENVIRONMENT: Lives with: sister-in-law Lives in: House/apartment  PLOF:  Level of assistance: Independent with ADLs, Independent with IADLs, Comment: poor health literacy, difficulty with reading and writing per family report Employment: Retired   PATIENT GOALS    per pt's sister-in-law, she hopes he will talk again  SUBJECTIVE STATEMENT: Pt's sister-in-law had questions about call she had received from [facility] services Pt accompanied by: sister-in-law  OBJECTIVE:   TODAY'S TREATMENT:  Skilled ST treatment session targeted pt's cognitive communication, aphasia and apraxia of speech goals. SLP facilitated session by providing the following interventions:  Pt with increased participation and laughter during today's session. He also had more islands of intelligible speech in the form of off the cuff comments - his responses to questions or spontaneous communication continues to be unintelligible d/t neologisms and lack of awareness  Pt was able to count 1-10  using pennies in 2 out of 3 opportunities with > 90% speech intelligibility  He was able to select basic color from field of 3 colors in 75% opportunities and he was able to imitate the name of the color in 25% of opportunities  When asked to put 3 cards in the appropriate sequence, he was able to do so in 3 out of 5 opportunities. While he attempted to verbally describe each picture, his intelligibility remained < 25%.   PATIENT EDUCATION: Education details: see above Person educated: Patient and his sister-in-law Education method: Explanation Education comprehension: needs further education  HOME EXERCISE PROGRAM:   Supplement verbal information with single written words    GOALS:  Goals reviewed with patient? Yes  SHORT TERM GOALS: Target date: 10 sessions  With maximal assistance, pt will select written word to express request for food/drink item in 7 out of 10 opportunities.  Baseline: Goal status: INITIAL  2.  With maximal multimodal assistance, pt will answer yes/no questions in 8 out of 10 opportunities.  Baseline:  Goal status: INITIAL  3.  With maximal multimodal assistance, pt will verbally approximate common object names in 5 out of 10 opportunities.  Baseline:  Goal status: INITIAL  LONG TERM GOALS: Target date: 11/20/2023  Pt will use multimodal communication and maximal assistance to communicate basic wants and needs in 2 out of 5 opportunities per pt and family report.  Baseline:  Goal status: INITIAL  2.  With Maximal A, patient/family will demonstrate understanding of the following concepts: aphasia, spontaneous recovery, communication vs conversation, strengths/strategies to promote success, local resources by answering multiple choice questions with 50% accuracy when provided supported conversation in order to increase patient and caregiver's participation in medical care.  Baseline:  Goal status: INITIAL   ASSESSMENT:  CLINICAL  IMPRESSION: Patient is a 73 y.o. right handed male who was seen today for a speech language treatment d/t left MCA CVA.   Pt presents with the appearance of global aphasia - potentially Wernicke's Aphasia. See below for details.  Given the severity of pt's aphasia, he is not able to communicate any basic wants/needs which (per his family's report) results in increased pt frustration and agitation.   Verbal Communication Pt's verbal communication was fluent but nonsensical speech consisting of normal rate and rhythm but didn't convey meaning. Pt was not aware of unintelligible nature of speech with few rote utterances that were intelligible. As such, pt was not able to name any basic common objects or produce rote utterances such as DOW, MOY (with written cues) or sing Happy Birthday. No ability to repeat basic words observed.   Auditory Comprehension Pt with moderately impaired auditory comprehension - given obejct name pt was able to select appropriate object in field of 2 in 4 out of 6 opportunities decreasing to 3 out of 8 opportunities with a field of 3, answered basic yes/no questions related to himself correctly in 4 out of 6 opportunities and followed basic 1-step directions related to self in 2 out of 4 opportunities.   Written Language During an unsuccessful attempt at verbal communication, he took this writer's pencil and attempted to write x 1 but was not able to write more than two letters ET but was able to write his first name.   Reading Comprehension Pt was able to pair written word with correct object in field of 3 with 100% accuracy (15 out of 15 opportunities).   Pt with much improved participation and response to therapy today.  See the above treatment note for details of session.   OBJECTIVE IMPAIRMENTS include expressive language, receptive language, aphasia, apraxia, and written language. These impairments are limiting patient from managing medications, managing  appointments, managing finances, household responsibilities, ADLs/IADLs, and effectively communicating at home and in community. Factors affecting potential to achieve goals and functional outcome are severity of impairments, family/community support, and significantly reduced health literacy. Patient will benefit from skilled SLP services to address above impairments and improve overall function.  REHAB POTENTIAL: Good  PLAN: SLP FREQUENCY: 1-2x/week  SLP DURATION: 12 weeks  PLANNED INTERVENTIONS: Language facilitation, Cueing hierachy, Internal/external aids, Functional tasks, Multimodal communication approach, SLP instruction and feedback, Compensatory strategies, and Patient/family education    Kashmere Staffa B. Rubbie, M.S., CCC-SLP, Tree surgeon Certified Brain Injury Specialist [facility]  [facility] Services Office [phone] Ascom [phone] Fax [phone]

## 2023-09-14 NOTE — Telephone Encounter (Signed)
 Faxed to [phone]

## 2023-09-17 ENCOUNTER — Ambulatory Visit: Admitting: Speech Pathology

## 2023-09-17 ENCOUNTER — Ambulatory Visit

## 2023-09-17 DIAGNOSIS — R482 Apraxia: Secondary | ICD-10-CM

## 2023-09-17 DIAGNOSIS — R2681 Unsteadiness on feet: Secondary | ICD-10-CM | POA: Diagnosis not present

## 2023-09-17 DIAGNOSIS — M6281 Muscle weakness (generalized): Secondary | ICD-10-CM

## 2023-09-17 DIAGNOSIS — R41841 Cognitive communication deficit: Secondary | ICD-10-CM

## 2023-09-17 DIAGNOSIS — R278 Other lack of coordination: Secondary | ICD-10-CM

## 2023-09-17 DIAGNOSIS — R4701 Aphasia: Secondary | ICD-10-CM

## 2023-09-17 NOTE — Therapy (Signed)
 OUTPATIENT SPEECH LANGUAGE PATHOLOGY  SPEECH LANGUAGE TREATMENT NOTE   Patient Name: Gregory Lane MRN: [id number] DOB:15-Feb-1951, 73 y.o., male 62 Date: 09/17/2023  PCP: Denyse DELENA Bathe, MD REFERRING PROVIDER: Arland Earnie Pouch, MD   End of Session - 09/17/23 1447     Visit Number 4    Number of Visits 25    Date for SLP Re-Evaluation 11/20/23    Authorization Type [employer] [place]    Progress Note Due on Visit 10    SLP Start Time 1400    SLP Stop Time  1445    SLP Time Calculation (min) 45 min    Activity Tolerance Patient tolerated treatment well          Past Medical History:  Diagnosis Date   Anxiety    Aortic atherosclerosis (HCC) 02/16/2017   Arthritis    Blind left eye    COPD (chronic obstructive pulmonary disease) (HCC)    Depression    Dysrhythmia    A-fib   GERD (gastroesophageal reflux disease)    Hypertension    Low TSH level 02/17/2017   Myocardial infarction [facility])    PAD (peripheral artery disease) (HCC)    Paroxysmal atrial fibrillation (HCC)    Perforated duodenal ulcer (HCC)    Stroke (HCC)    left side is weak   Past Surgical History:  Procedure Laterality Date   BRONCHIAL BIOPSY  03/22/2022   Procedure: BRONCHIAL BIOPSIES;  Surgeon: Isadora Hose, MD;  Location: [facility];  Service: Pulmonary;;   BRONCHIAL NEEDLE ASPIRATION BIOPSY  03/22/2022   Procedure: BRONCHIAL NEEDLE ASPIRATION BIOPSIES;  Surgeon: Isadora Hose, MD;  Location: [facility];  Service: Pulmonary;;   ENDOBRONCHIAL ULTRASOUND  03/22/2022   Procedure: ENDOBRONCHIAL ULTRASOUND;  Surgeon: Isadora Hose, MD;  Location: [facility];  Service: Pulmonary;;   EXPLORATORY LAPAROTOMY  05/12/2015   EYE SURGERY     FRACTURE SURGERY     LAPAROTOMY N/A 05/10/2015   Procedure: EXPLORATORY LAPAROTOMY;  Surgeon: Lynda Leos, MD;  Location: [facility] OR;  Service: General;  Laterality: N/A;   LEFT HEART CATH AND CORONARY ANGIOGRAPHY N/A 05/02/2016   Procedure: Left Heart  Cath and Coronary Angiography;  Surgeon: Gordy Bergamo, MD;  Location: [facility] INVASIVE CV LAB;  Service: Cardiovascular;  Laterality: N/A;   LOWER EXTREMITY ANGIOGRAPHY Left 09/21/2021   Procedure: Lower Extremity Angiography;  Surgeon: Jama Cordella KANDICE, MD;  Location: [facility] INVASIVE CV LAB;  Service: Cardiovascular;  Laterality: Left;   Patient Active Problem List   Diagnosis Date Noted   Allergic rhinitis due to pollen 12/11/2022   Tinea cruris 12/11/2022   BPH associated with nocturia 12/11/2022   Chest pain, non-cardiac 04/11/2022   Adenocarcinoma of lower lobe of right lung (HCC) 03/28/2022   Pulmonary nodule 1 cm or greater in diameter 03/22/2022   Leg weakness, bilateral 02/09/2022   Osteopenia 10/13/2021   PAD (peripheral artery disease) (HCC) 09/21/2021   GERD without esophagitis 09/21/2021   Dyslipidemia 09/21/2021   Depression 09/21/2021   Atrial flutter (HCC) 09/21/2021   Chronic obstructive pulmonary disease (COPD) (HCC) 09/21/2021   Coronary artery disease 09/21/2021   Cerebrovascular accident (CVA) due to embolism of cerebral artery (HCC) 09/06/2021   Bilateral edema of lower extremity 08/05/2020   Vasovagal syncope 08/02/2020   Annual physical exam 12/26/2019   Hyperlipidemia 12/26/2019   Abrasion of right hand 12/26/2019   Metabolic syndrome 11/11/2019   Low TSH level 02/17/2017   Atrial flutter with rapid ventricular response (HCC) 02/16/2017  Cigarette smoker 02/16/2017   COPD with acute exacerbation (HCC) 02/16/2017   Elevated lactic acid level 02/16/2017   Aortic atherosclerosis (HCC) 02/16/2017   Productive cough    Hypoxia 05/24/2015   SOB (shortness of breath) 05/24/2015   COPD suggested by initial evaluation (HCC) 05/17/2015   Essential hypertension 05/17/2015   Smoking 05/17/2015    ONSET DATE: 08/09/2023   REFERRING DIAG: I63.9 (ICD-10-CM) - Cerebral infarction, unspecified   THERAPY DIAG:  Aphasia  Apraxia  Rationale for Evaluation and Treatment  Rehabilitation  SUBJECTIVE:   PERTINENT HISTORY and DIAGNOSTIC FINDINGS: Pt is a 73 year old right handed male who presented to [facility] ED on 08/09/2023 with stroke-like symptoms - slurred speech and difficulty answering questions and weakness to his right side extremities. CT head and subsequent CTA head and neck shows left M2 occlusion with large penumbra in the region. Pt transferred to [facility] for mechanical thrombectomy on 08/10/2023. Pt admitted at [facility] from 08/10/2023 thru 08/21/2023 with recommended for home health.   MRI brain: Remonstrated left MCA territory infarct primarily involving the parietal lobe.   Additional medical history includes: blindness left eye, A-fib on warfarin, COPD, HFpEF, HTN, current smoker, ethanol abuse, RLL adenocarcinoma s/p SBRT (2024)     PAIN:  Are you having pain? Unable to communicate  FALLS: Has patient fallen in last 6 months?  No  LIVING ENVIRONMENT: Lives with: sister-in-law Lives in: House/apartment  PLOF:  Level of assistance: Independent with ADLs, Independent with IADLs, Comment: poor health literacy, difficulty with reading and writing per family report Employment: Retired   PATIENT GOALS    per pt's sister-in-law, she hopes he will talk again  SUBJECTIVE STATEMENT: Pt smiling and said there she is when this therapist got him from lobby Pt accompanied by: sister-in-law  OBJECTIVE:   TODAY'S TREATMENT:  Skilled ST treatment session targeted pt's cognitive communication, aphasia and apraxia of speech goals. SLP facilitated session by providing the following interventions:  Pt with increased participation and laughter during today's session. He also had more islands of intelligible speech in the form of off the cuff comments - his responses to questions or spontaneous communication continues to be unintelligible d/t neologisms and lack of awareness of errors. Slight increase in toleration of bringing awareness to expressive and  receptive errors.   Pt was able to count 1-10 using pennies in 1 out of 2 opportunities with > 90% speech intelligibility  Pt able to select between 2 pictures of basic common objects with 100%; this Clinical research associate provided a list of basic items of pt's with sister-in-law to email pictures of each based on pt's personal items  Sister-in-law had questions about having pt potentially work on the computer therefore [employer] app was trialed in therapy. Unfortunately pt was not able to perform functional repetition or word id. Not appropriate at this time.   Pt was observed with increased effort to communicate as observed by pt using gestures to aid in communication  Skilled education provided to pt's sister on use of gestures to help with receptive abilities, yes/no questions and pointing to objects to help supplement pt's communication   PATIENT EDUCATION: Education details: see above Person educated: Patient and his sister-in-law Education method: Explanation Education comprehension: needs further education  HOME EXERCISE PROGRAM:   Supplement verbal information with single written words    GOALS:  Goals reviewed with patient? Yes  SHORT TERM GOALS: Target date: 10 sessions  With maximal assistance, pt will select written word to express request  for food/drink item in 7 out of 10 opportunities.  Baseline: Goal status: INITIAL  2.  With maximal multimodal assistance, pt will answer yes/no questions in 8 out of 10 opportunities.  Baseline:  Goal status: INITIAL  3.  With maximal multimodal assistance, pt will verbally approximate common object names in 5 out of 10 opportunities.  Baseline:  Goal status: INITIAL   LONG TERM GOALS: Target date: 11/20/2023  Pt will use multimodal communication and maximal assistance to communicate basic wants and needs in 2 out of 5 opportunities per pt and family report.  Baseline:  Goal status: INITIAL  2.  With Maximal A, patient/family  will demonstrate understanding of the following concepts: aphasia, spontaneous recovery, communication vs conversation, strengths/strategies to promote success, local resources by answering multiple choice questions with 50% accuracy when provided supported conversation in order to increase patient and caregiver's participation in medical care.  Baseline:  Goal status: INITIAL   ASSESSMENT:  CLINICAL IMPRESSION: Patient is a 73 y.o. right handed male who was seen today for a speech language treatment d/t left MCA CVA.   Pt presents with the appearance of global aphasia - potentially Wernicke's Aphasia. See below for details.  Given the severity of pt's aphasia, he is not able to communicate any basic wants/needs which (per his family's report) results in increased pt frustration and agitation.   Verbal Communication Pt's verbal communication was fluent but nonsensical speech consisting of normal rate and rhythm but didn't convey meaning. Pt was not aware of unintelligible nature of speech with few rote utterances that were intelligible. As such, pt was not able to name any basic common objects or produce rote utterances such as DOW, MOY (with written cues) or sing Happy Birthday. No ability to repeat basic words observed.   Auditory Comprehension Pt with moderately impaired auditory comprehension - given obejct name pt was able to select appropriate object in field of 2 in 4 out of 6 opportunities decreasing to 3 out of 8 opportunities with a field of 3, answered basic yes/no questions related to himself correctly in 4 out of 6 opportunities and followed basic 1-step directions related to self in 2 out of 4 opportunities.   Written Language During an unsuccessful attempt at verbal communication, he took this writer's pencil and attempted to write x 1 but was not able to write more than two letters ET but was able to write his first name.   Reading Comprehension Pt was able to pair written  word with correct object in field of 3 with 100% accuracy (15 out of 15 opportunities).   Pt continues with much improved participation and response to therapy today.  This Clinical research associate received email from pt's sister-in-law regarding information she received from [facility]. The email is about participation in a research study, not HH services. She has not heard from [facility] therefore we will continue with outpatient services as per current plan of care. See the above treatment note for details of session.   OBJECTIVE IMPAIRMENTS include expressive language, receptive language, aphasia, apraxia, and written language. These impairments are limiting patient from managing medications, managing appointments, managing finances, household responsibilities, ADLs/IADLs, and effectively communicating at home and in community. Factors affecting potential to achieve goals and functional outcome are severity of impairments, family/community support, and significantly reduced health literacy. Patient will benefit from skilled SLP services to address above impairments and improve overall function.  REHAB POTENTIAL: Good  PLAN: SLP FREQUENCY: 1-2x/week  SLP DURATION: 12 weeks  PLANNED INTERVENTIONS:  Language facilitation, Cueing hierachy, Internal/external aids, Functional tasks, Multimodal communication approach, SLP instruction and feedback, Compensatory strategies, and Patient/family education    Tyasia Packard B. Rubbie, M.S., CCC-SLP, Tree surgeon Certified Brain Injury Specialist [facility]  [facility] Rehabilitation Services Office [phone] Ascom [phone] Fax [phone]

## 2023-09-18 ENCOUNTER — Ambulatory Visit: Admitting: Speech Pathology

## 2023-09-18 ENCOUNTER — Telehealth: Payer: Self-pay

## 2023-09-18 ENCOUNTER — Ambulatory Visit: Admitting: Physical Therapy

## 2023-09-18 NOTE — Telephone Encounter (Signed)
 Pt needs a referral to neurology after having a stroke. Pt was released from the hospital on 7/1 and was last seen 7/7.

## 2023-09-20 ENCOUNTER — Ambulatory Visit: Admitting: Speech Pathology

## 2023-09-20 ENCOUNTER — Ambulatory Visit: Admitting: Occupational Therapy

## 2023-09-20 DIAGNOSIS — R482 Apraxia: Secondary | ICD-10-CM

## 2023-09-20 DIAGNOSIS — M6281 Muscle weakness (generalized): Secondary | ICD-10-CM

## 2023-09-20 DIAGNOSIS — R4701 Aphasia: Secondary | ICD-10-CM

## 2023-09-20 DIAGNOSIS — R278 Other lack of coordination: Secondary | ICD-10-CM

## 2023-09-20 DIAGNOSIS — R2681 Unsteadiness on feet: Secondary | ICD-10-CM | POA: Diagnosis not present

## 2023-09-20 NOTE — Therapy (Signed)
 OUTPATIENT SPEECH LANGUAGE PATHOLOGY  SPEECH LANGUAGE TREATMENT NOTE   Patient Name: Gregory Lane MRN: [id number] DOB:January 07, 1951, 73 y.o., male 20 Date: 09/20/2023  PCP: Denyse DELENA Bathe, MD REFERRING PROVIDER: Arland Earnie Pouch, MD   End of Session - 09/20/23 1254     Visit Number 5    Number of Visits 25    Date for SLP Re-Evaluation 11/20/23    Authorization Type [employer] [place]    Progress Note Due on Visit 10    SLP Start Time 1315    SLP Stop Time  1400    SLP Time Calculation (min) 45 min    Activity Tolerance Patient tolerated treatment well          Past Medical History:  Diagnosis Date   Anxiety    Aortic atherosclerosis (HCC) 02/16/2017   Arthritis    Blind left eye    COPD (chronic obstructive pulmonary disease) (HCC)    Depression    Dysrhythmia    A-fib   GERD (gastroesophageal reflux disease)    Hypertension    Low TSH level 02/17/2017   Myocardial infarction [facility])    PAD (peripheral artery disease) (HCC)    Paroxysmal atrial fibrillation (HCC)    Perforated duodenal ulcer (HCC)    Stroke (HCC)    left side is weak   Past Surgical History:  Procedure Laterality Date   BRONCHIAL BIOPSY  03/22/2022   Procedure: BRONCHIAL BIOPSIES;  Surgeon: Isadora Hose, MD;  Location: [facility];  Service: Pulmonary;;   BRONCHIAL NEEDLE ASPIRATION BIOPSY  03/22/2022   Procedure: BRONCHIAL NEEDLE ASPIRATION BIOPSIES;  Surgeon: Isadora Hose, MD;  Location: [facility];  Service: Pulmonary;;   ENDOBRONCHIAL ULTRASOUND  03/22/2022   Procedure: ENDOBRONCHIAL ULTRASOUND;  Surgeon: Isadora Hose, MD;  Location: [facility];  Service: Pulmonary;;   EXPLORATORY LAPAROTOMY  05/12/2015   EYE SURGERY     FRACTURE SURGERY     LAPAROTOMY N/A 05/10/2015   Procedure: EXPLORATORY LAPAROTOMY;  Surgeon: Lynda Leos, MD;  Location: [facility] OR;  Service: General;  Laterality: N/A;   LEFT HEART CATH AND CORONARY ANGIOGRAPHY N/A 05/02/2016   Procedure: Left Heart  Cath and Coronary Angiography;  Surgeon: Gordy Bergamo, MD;  Location: [facility] INVASIVE CV LAB;  Service: Cardiovascular;  Laterality: N/A;   LOWER EXTREMITY ANGIOGRAPHY Left 09/21/2021   Procedure: Lower Extremity Angiography;  Surgeon: Jama Cordella KANDICE, MD;  Location: [facility] INVASIVE CV LAB;  Service: Cardiovascular;  Laterality: Left;   Patient Active Problem List   Diagnosis Date Noted   Allergic rhinitis due to pollen 12/11/2022   Tinea cruris 12/11/2022   BPH associated with nocturia 12/11/2022   Chest pain, non-cardiac 04/11/2022   Adenocarcinoma of lower lobe of right lung (HCC) 03/28/2022   Pulmonary nodule 1 cm or greater in diameter 03/22/2022   Leg weakness, bilateral 02/09/2022   Osteopenia 10/13/2021   PAD (peripheral artery disease) (HCC) 09/21/2021   GERD without esophagitis 09/21/2021   Dyslipidemia 09/21/2021   Depression 09/21/2021   Atrial flutter (HCC) 09/21/2021   Chronic obstructive pulmonary disease (COPD) (HCC) 09/21/2021   Coronary artery disease 09/21/2021   Cerebrovascular accident (CVA) due to embolism of cerebral artery (HCC) 09/06/2021   Bilateral edema of lower extremity 08/05/2020   Vasovagal syncope 08/02/2020   Annual physical exam 12/26/2019   Hyperlipidemia 12/26/2019   Abrasion of right hand 12/26/2019   Metabolic syndrome 11/11/2019   Low TSH level 02/17/2017   Atrial flutter with rapid ventricular response (HCC) 02/16/2017  Cigarette smoker 02/16/2017   COPD with acute exacerbation (HCC) 02/16/2017   Elevated lactic acid level 02/16/2017   Aortic atherosclerosis (HCC) 02/16/2017   Productive cough    Hypoxia 05/24/2015   SOB (shortness of breath) 05/24/2015   COPD suggested by initial evaluation (HCC) 05/17/2015   Essential hypertension 05/17/2015   Smoking 05/17/2015    ONSET DATE: 08/09/2023   REFERRING DIAG: I63.9 (ICD-10-CM) - Cerebral infarction, unspecified   THERAPY DIAG:  Aphasia  Apraxia  Rationale for Evaluation and Treatment  Rehabilitation  SUBJECTIVE:   PERTINENT HISTORY and DIAGNOSTIC FINDINGS: Pt is a 73 year old right handed male who presented to [facility] ED on 08/09/2023 with stroke-like symptoms - slurred speech and difficulty answering questions and weakness to his right side extremities. CT head and subsequent CTA head and neck shows left M2 occlusion with large penumbra in the region. Pt transferred to [facility] for mechanical thrombectomy on 08/10/2023. Pt admitted at [facility] from 08/10/2023 thru 08/21/2023 with recommended for home health.   MRI brain: Remonstrated left MCA territory infarct primarily involving the parietal lobe.   Additional medical history includes: blindness left eye, A-fib on warfarin, COPD, HFpEF, HTN, current smoker, ethanol abuse, RLL adenocarcinoma s/p SBRT (2024)     PAIN:  Are you having pain? Unable to communicate  FALLS: Has patient fallen in last 6 months?  No  LIVING ENVIRONMENT: Lives with: sister-in-law Lives in: House/apartment  PLOF:  Level of assistance: Independent with ADLs, Independent with IADLs, Comment: poor health literacy, difficulty with reading and writing per family report Employment: Retired   PATIENT GOALS    per pt's sister-in-law, she hopes he will talk again  SUBJECTIVE STATEMENT: Pt was frowning, said an utterance containing home, he and his sister arrived early for session and he was irritated with having to wait Pt accompanied by: sister-in-law  OBJECTIVE:   TODAY'S TREATMENT:  Skilled ST treatment session targeted pt's cognitive communication, aphasia and apraxia of speech goals. SLP facilitated session by providing the following interventions:  Despite pt's appearance of irritation from waiting for appt, pt engaged in all tasks that this Clinical research associate presented  Selecting picture from field of 3: 17 out 20 Pairing written word with picture in field of 3: 20 out of 20   PATIENT EDUCATION: Education details: see above Person educated: Patient  and his sister-in-law Education method: Explanation Education comprehension: needs further education  HOME EXERCISE PROGRAM:   Supplement verbal information with single written words    GOALS:  Goals reviewed with patient? Yes  SHORT TERM GOALS: Target date: 10 sessions  With maximal assistance, pt will select written word to express request for food/drink item in 7 out of 10 opportunities.  Baseline: Goal status: INITIAL  2.  With maximal multimodal assistance, pt will answer yes/no questions in 8 out of 10 opportunities.  Baseline:  Goal status: INITIAL  3.  With maximal multimodal assistance, pt will verbally approximate common object names in 5 out of 10 opportunities.  Baseline:  Goal status: INITIAL   LONG TERM GOALS: Target date: 11/20/2023  Pt will use multimodal communication and maximal assistance to communicate basic wants and needs in 2 out of 5 opportunities per pt and family report.  Baseline:  Goal status: INITIAL  2.  With Maximal A, patient/family will demonstrate understanding of the following concepts: aphasia, spontaneous recovery, communication vs conversation, strengths/strategies to promote success, local resources by answering multiple choice questions with 50% accuracy when provided supported conversation in order to increase patient  and caregiver's participation in medical care.  Baseline:  Goal status: INITIAL   ASSESSMENT:  CLINICAL IMPRESSION: Patient is a 73 y.o. right handed male who was seen today for a speech language treatment d/t left MCA CVA.   Pt presents with the appearance of global aphasia - potentially Wernicke's Aphasia. See below for details.  Given the severity of pt's aphasia, he is not able to communicate any basic wants/needs which (per his family's report) results in increased pt frustration and agitation.   Verbal Communication Pt's verbal communication was fluent but nonsensical speech consisting of normal rate and  rhythm but didn't convey meaning. Pt was not aware of unintelligible nature of speech with few rote utterances that were intelligible. As such, pt was not able to name any basic common objects or produce rote utterances such as DOW, MOY (with written cues) or sing Happy Birthday. No ability to repeat basic words observed.   Auditory Comprehension Pt with moderately impaired auditory comprehension - given obejct name pt was able to select appropriate object in field of 2 in 4 out of 6 opportunities decreasing to 3 out of 8 opportunities with a field of 3, answered basic yes/no questions related to himself correctly in 4 out of 6 opportunities and followed basic 1-step directions related to self in 2 out of 4 opportunities.   Written Language During an unsuccessful attempt at verbal communication, he took this writer's pencil and attempted to write x 1 but was not able to write more than two letters ET but was able to write his first name.   Reading Comprehension Pt was able to pair written word with correct object in field of 3 with 100% accuracy (15 out of 15 opportunities).   Pt continues with much improved participation and response to therapy today.  This Clinical research associate received email from pt's sister-in-law regarding information she received from [facility]. The email is about participation in a research study, not HH services. She has not heard from [facility] therefore we will continue with outpatient services as per current plan of care. See the above treatment note for details of session.   OBJECTIVE IMPAIRMENTS include expressive language, receptive language, aphasia, apraxia, and written language. These impairments are limiting patient from managing medications, managing appointments, managing finances, household responsibilities, ADLs/IADLs, and effectively communicating at home and in community. Factors affecting potential to achieve goals and functional outcome are severity of impairments, family/community  support, and significantly reduced health literacy. Patient will benefit from skilled SLP services to address above impairments and improve overall function.  REHAB POTENTIAL: Good  PLAN: SLP FREQUENCY: 1-2x/week  SLP DURATION: 12 weeks  PLANNED INTERVENTIONS: Language facilitation, Cueing hierachy, Internal/external aids, Functional tasks, Multimodal communication approach, SLP instruction and feedback, Compensatory strategies, and Patient/family education    Fernie Grimm B. Rubbie, M.S., CCC-SLP, Tree surgeon Certified Brain Injury Specialist [facility]  [facility] Office [phone] Ascom [phone] Fax [phone]

## 2023-09-20 NOTE — Therapy (Signed)
 OUTPATIENT OCCUPATIONAL THERAPY NEURO TREATMENT  Patient Name: ANA LIAW MRN: [id number] DOB:[birth year], 73 y.o., male Today's Date: 09/20/2023  PCP: Dr. Denyse Bathe REFERRING PROVIDER: Dr. Arland Earnie Pouch  END OF SESSION:  OT End of Session - 09/20/23 1007     Visit Number 4    Number of Visits 24    Date for OT Re-Evaluation 11/29/23    OT Start Time 1445    OT Stop Time 1532    OT Time Calculation (min) 47 min    Activity Tolerance Patient tolerated treatment well    Behavior During Therapy [facility] for tasks assessed/performed            Past Medical History:  Diagnosis Date   Anxiety    Aortic atherosclerosis (HCC) 02/16/2017   Arthritis    Blind left eye    COPD (chronic obstructive pulmonary disease) (HCC)    Depression    Dysrhythmia    A-fib   GERD (gastroesophageal reflux disease)    Hypertension    Low TSH level 02/17/2017   Myocardial infarction [facility])    PAD (peripheral artery disease) (HCC)    Paroxysmal atrial fibrillation (HCC)    Perforated duodenal ulcer (HCC)    Stroke (HCC)    left side is weak   Past Surgical History:  Procedure Laterality Date   BRONCHIAL BIOPSY  03/22/2022   Procedure: BRONCHIAL BIOPSIES;  Surgeon: Isadora Hose, MD;  Location: [facility] ENDOSCOPY;  Service: Pulmonary;;   BRONCHIAL NEEDLE ASPIRATION BIOPSY  03/22/2022   Procedure: BRONCHIAL NEEDLE ASPIRATION BIOPSIES;  Surgeon: Isadora Hose, MD;  Location: [facility] ENDOSCOPY;  Service: Pulmonary;;   ENDOBRONCHIAL ULTRASOUND  03/22/2022   Procedure: ENDOBRONCHIAL ULTRASOUND;  Surgeon: Isadora Hose, MD;  Location: [facility] ENDOSCOPY;  Service: Pulmonary;;   EXPLORATORY LAPAROTOMY  05/12/2015   EYE SURGERY     FRACTURE SURGERY     LAPAROTOMY N/A 05/10/2015   Procedure: EXPLORATORY LAPAROTOMY;  Surgeon: Lynda Leos, MD;  Location: [facility] OR;  Service: General;  Laterality: N/A;   LEFT HEART CATH AND CORONARY ANGIOGRAPHY N/A 05/02/2016   Procedure: Left Heart Cath and Coronary  Angiography;  Surgeon: Gordy Bergamo, MD;  [facility] INVASIVE CV LAB;  Service: Cardiovascular;  Laterality: N/A;   LOWER EXTREMITY ANGIOGRAPHY Left 09/21/2021   Procedure: Lower Extremity Angiography;  Surgeon: Jama Cordella KANDICE, MD;  Location: [facility] INVASIVE CV LAB;  Service: Cardiovascular;  Laterality: Left;   Patient Active Problem List   Diagnosis Date Noted   Allergic rhinitis due to pollen 12/11/2022   Tinea cruris 12/11/2022   BPH associated with nocturia 12/11/2022   Chest pain, non-cardiac 04/11/2022   Adenocarcinoma of lower lobe of right lung (HCC) 03/28/2022   Pulmonary nodule 1 cm or greater in diameter 03/22/2022   Leg weakness, bilateral 02/09/2022   Osteopenia 10/13/2021   PAD (peripheral artery disease) (HCC) 09/21/2021   GERD without esophagitis 09/21/2021   Dyslipidemia 09/21/2021   Depression 09/21/2021   Atrial flutter (HCC) 09/21/2021   Chronic obstructive pulmonary disease (COPD) (HCC) 09/21/2021   Coronary artery disease 09/21/2021   Cerebrovascular accident (CVA) due to embolism of cerebral artery (HCC) 09/06/2021   Bilateral edema of lower extremity 08/05/2020   Vasovagal syncope 08/02/2020   Annual physical exam 12/26/2019   Hyperlipidemia 12/26/2019   Abrasion of right hand 12/26/2019   Metabolic syndrome 11/11/2019   Low TSH level 02/17/2017   Atrial flutter with rapid ventricular response (HCC) 02/16/2017   Cigarette smoker 02/16/2017   COPD with acute exacerbation (  HCC) 02/16/2017   Elevated lactic acid level 02/16/2017   Aortic atherosclerosis (HCC) 02/16/2017   Productive cough    Hypoxia 05/24/2015   SOB (shortness of breath) 05/24/2015   COPD suggested by initial evaluation (HCC) 05/17/2015   Essential hypertension 05/17/2015   Smoking 05/17/2015   ONSET DATE: 08/10/23  REFERRING DIAG: I63.9 (ICD-10-CM) - Cerebral infarction, unspecified   THERAPY DIAG:  Muscle weakness (generalized)  Other lack of coordination  Cognitive  communication deficit  Rationale for Evaluation and Treatment: Rehabilitation  SUBJECTIVE:  SUBJECTIVE STATEMENT:  Pt. able to clearly verbalize a 2 word greeting upon arrival. Pt accompanied by: family member, Chief Operating Officer (pt's SIL)  PERTINENT HISTORY: Per chart: Hospital Course:   ASIM GERSTEN is a 73 y.o. male with a pertinent past medical history of stroke with residual left-sided weakness, blindness left eye, A-fib on warfarin, COPD, HFpEF, HTN, current smoker, ethanol abuse, RLL adenocarcinoma s/p SBRT (2024) who was admitted to [facility] on 08/10/2023 for thrombectomy for left MCA occlusion .  # Ischemic stroke MCA territory s/p thrombectomy: LKN 08/09/23 15: 00. NIHSS was 11 on presentation to [facility] with points for global aphasia, right facial droop, right-sided weakness. Did not receive thrombolytics. Left M1/M2 occlusion on CTA. Status post mechanical thrombectomy on 6/20, TICI 3 after 5 passes. Etiology could include cardioembolic in the setting of subtherapeutic INR vs athlerosclerotic in the setting of demonstrated intracranial disease. PT/OT recommended AIR however denied from rehabilitation center within his network, discussed with our therapists and patient's family with ultimate decision to discharge home with home health, patient reportedly has rollator walker and shower chair at home, family confirmed they will be present for support and observatio   PRECAUTIONS: Fall  WEIGHT BEARING RESTRICTIONS: No  PAIN:   09/11/2023: Pt. Indicated 6/10 pain in the whole RUE using Wong-Baker Facial Grimace Scale. Pt. Caregiver reports that he has had pain in the right shoulder.  6/10 pain on faces scale  Are you having pain? Yes: NPRS scale: N/A (limited by expressive aphasia) Pain location: R wrist and hand Pain description: unable to verbalize Aggravating factors: activity/pressure through the wrist/hand Relieving factors: rest  FALLS: Has patient fallen in last 6 months? Yes. Number of  falls 1 when he had the stroke   LIVING ENVIRONMENT: Lives with: lives with their family; pt resides with his SIL, Elveria.  Elveria reports she and pt both lost their spouse's, so it's the 2 of them left. Lives in: 1 level home Stairs: 4 steps in the front and back and both have rails on both side  Has following equipment at home: cane, walker, 2 grab bars in the shower, standard toilet,   PLOF: Independent, relies on SIL for transportation   PATIENT GOALS: use the arm better and improve the speech   OBJECTIVE:  Note: Objective measures were completed at Evaluation unless otherwise noted.  HAND DOMINANCE: Right  ADLs: Overall ADLs: Elveria provides assist as needed Transfers/ambulation related to ADLs: indep without AD; occasional furniture or wall walking  Eating: starting to use R hand with utensils and eating, difficult to cut food, mild ataxia with RUE coordination hand to mouth Grooming: goes to barber for shaving, does not brush teeth (pt points to his 1 tooth) and shook his head when OT provided education on still cleaning mouth/gums/brushing to prevent bacteria growth/infection UB Dressing: extra time for buttoning.  LB Dressing: extra time for clothing fasteners Toileting: caregiver reports pt uses the wall to help with transfers; modified indep with toileting (  OT anticipates difficulty with hygiene and clothing management d/t coordination impairment). Bathing: distant supv-modified indep Tub Shower transfers: distant supv  Equipment: Grab bars  IADLs: Shopping: accompanied by SIL Light housekeeping: SIL manages Meal Prep: SIL manages; pt can manage cold meal prep Community mobility: using transport chair today to reach therapy gym Medication management: SIL sets meds in pill Chief Strategy Officer management: SIL writes the checks, currently pt unable, pt has autopay on most bills  Handwriting: Pt able to print name with fair legibility, but left letters out (writing  mostly limited by language deficits post CVA), as pt demonstrated good ability to hold pen and formulate letters.   POSTURE COMMENTS:  rounded shoulders and forward head  ACTIVITY TOLERANCE: Activity tolerance: TBD   UPPER EXTREMITY ROM:  L shoulder hx of arthritis, RUE (stroke affected side) WFL    Active ROM  Right eval Left eval  Shoulder flexion WFL 90  Shoulder abduction WFL 90  Shoulder adduction    Shoulder extension    Shoulder internal rotation WFL Substitution patterns to reach behind back  Shoulder external rotation WFL Substitution patterns to reach behind head  Middle trapezius    Lower trapezius    Elbow flexion    Elbow extension    Wrist flexion    Wrist extension    Wrist ulnar deviation    Wrist radial deviation    Wrist pronation    Wrist supination    (Blank rows = not tested)  UPPER EXTREMITY MMT:  RUE grossly 5/5 at the shoulder, elbow, wrist       L shoulder limited by arthritic pain, but able to withstand at least mild resistance from OT, L elbow, wrist grossly 5/5  HAND FUNCTION: Grip strength: Right: 56 lbs; Left: 56 lbs, Lateral pinch: Right: 16 lbs, Left: 12 lbs, and 3 point pinch: Right: 10 lbs, Left: 10 lbs  COORDINATION: Finger Nose Finger test: mild deficit RUE/decreased accuracy 9 Hole Peg test: Right: 1 min 2  sec; Left: 35 sec  SENSATION: unable to accurately assess d/t language deficits  EDEMA: No visible edema  MUSCLE TONE: RUE: Within functional limits  COGNITION: Overall cognitive status: cognitive/communication deficits: globablly aphasic (see SLP note for further details)  VISION:  Unable to accurately assess d/t cognitive/communication deficits; will continue to assess further within functional contexts Per chart: hx of blindness L eye  PERCEPTION: Not tested  PRAXIS: Impaired: Motor planning, mild-moderate RUE ataxia, mild apraxia                                                                                                                          TREATMENT DATE: 09/17/23   Therapeutic Activities:  Pt engaged in functional reaching tasks today from seated position to obtain items presented in a variety of planes of motion.  Occasional cues and demonstration for pt to understand directions for task. Pt seen for manipulation of resistive pinch pins, reaching and placing onto dowel in a variety of  directions able to complete all levels of pinch with the black and blue more resistant, demonstrates mild difficulty with these. Cutting with regular scissors this date with straight and wavy lines and did well.       Neuromuscular re-education:  Pt seen this date for focus on manipulation of small to large washers, placing onto a long dowel stick and then removing one by one.  Pt instructed on moving items through the hand for translatory movements and using the hand for storage.  Difficulty at times with moving items to fingertips but responds well to visual cues.  Pt seen for unknotting of medium nylon rope with bilateral hand use with cues for prehension patterns.   Pt engaging in card flipping with thumb and finger combinations, turning towards and away with demonstration and cues.  Manipulation of [place]  discs, flipping to opposite sides and stacking items.     PATIENT EDUCATION: Education details: Green theraputty exercises, Fmc tasks, functional tasks Person educated: Patient and Publishing rights manager method: Explanation Education comprehension: verbalized understanding  HOME EXERCISE PROGRAM: Green theraputty exercises  GOALS: Goals reviewed with patient? Yes  SHORT TERM GOALS: Target date: 10/18/23  Pt will perform HEP for improving RUE strength and coordination for daily tasks.  Baseline: Eval: HEP not yet initiated Goal status: INITIAL   LONG TERM GOALS: Target date: 11/29/23  Pt will increase R grip strength by 5 or more lbs in order to securely hold and carry heavy ADL supplies, and  work towards assumed PLOF based on dominant/non-dominant hand comparisons.  Baseline: Eval: R dominant 56 lbs (L 56 lbs) Goal status: INITIAL  2.  Pt will increase RUE GMC/FMC skills in order to safely and independently shave face with R dominant hand. Baseline: Eval: Requires assist from barber, per SIL Goal status: INITIAL  3.  Pt will increase R hand FMC skills in order to improve efficiency with clothing fasteners, as demonstrated by completion of 9 hole peg test in 45 sec or less using R dominant hand.  Baseline: Eval: R 1 min 2 sec (L 35 sec) Goal status: INITIAL  ASSESSMENT:  CLINICAL IMPRESSION:  Pt responds to verbal and tactile cues with exercises and tasks.  He appears agitated at times when he doesn't understand the directions or questioning the purpose of the activity.  Once therapist explains task and how it benefits him, he appeared to be more relaxed and engaged in activity.  Pt demonstrates some mild difficulty with more resistive pinch pins, blue and black levels.  He drops items occasionally when working and manipulating smaller objects.  Pt did well with scissors and cutting straight and wavy lines.  Some difficulty with oppositional movements of the thumb during tasks.   Pt continues to benefit from skilled OT with primary focus on improving RUE gross and fine motor coordination in order to increase efficiency when engaging the R dominant arm into daily tasks.    PERFORMANCE DEFICITS: in functional skills including ADLs, IADLs, coordination, dexterity, strength, Fine motor control, Gross motor control, mobility, body mechanics, decreased knowledge of precautions, decreased knowledge of use of DME, and UE functional use, cognitive skills including attention, emotional, temperament/personality, and understand, and psychosocial skills including coping strategies, environmental adaptation, habits, interpersonal interactions, and routines and behaviors.   IMPAIRMENTS: are limiting  patient from ADLs, IADLs, and leisure.   CO-MORBIDITIES: has co-morbidities such as anxiety/depression/blindness L eye, HTN that affects occupational performance. Patient will benefit from skilled OT to address above impairments and improve overall function.  MODIFICATION OR ASSISTANCE TO COMPLETE EVALUATION: Min-Moderate modification of tasks or assist with assess necessary to complete an evaluation.  OT OCCUPATIONAL PROFILE AND HISTORY: Detailed assessment: Review of records and additional review of physical, cognitive, psychosocial history related to current functional performance.  CLINICAL DECISION MAKING: Moderate - several treatment options, min-mod task modification necessary  REHAB POTENTIAL: Good  EVALUATION COMPLEXITY: Moderate    PLAN:  OT FREQUENCY: 1-2x/week  OT DURATION: 12 weeks  PLANNED INTERVENTIONS: 97168 OT Re-evaluation, 97535 self care/ADL training, 02889 therapeutic exercise, 97530 therapeutic activity, 97112 neuromuscular re-education, 97140 manual therapy, 97116 gait training, 02989 moist heat, 97010 cryotherapy, 97129 Cognitive training (first 15 min), 02869 Cognitive training(each additional 15 min), 02249 Physical Performance Testing, passive range of motion, balance training, functional mobility training, visual/perceptual remediation/compensation, psychosocial skills training, energy conservation, coping strategies training, patient/family education, and DME and/or AE instructions  RECOMMENDED OTHER SERVICES: None at this time (Pt has poc in place for SLP services)  CONSULTED AND AGREED WITH PLAN OF CARE: Patient and family Adult nurse, Chief Operating Officer (SIL)  PLAN FOR NEXT SESSION: see above    Jaiyla Granados T Shaila Gilchrest, OTR/L, CLT  09/20/2023, 2:29 PM

## 2023-09-20 NOTE — Therapy (Signed)
 OUTPATIENT OCCUPATIONAL THERAPY NEURO TREATMENT  Patient Name: Gregory Lane MRN: [id number] DOB:1950-08-25, 73 y.o., male Today's Date: 09/20/2023  PCP: Dr. Denyse Bathe REFERRING PROVIDER: Dr. Arland Earnie Pouch  END OF SESSION:  OT End of Session - 09/20/23 1748     Visit Number 5    Number of Visits 24    Date for OT Re-Evaluation 11/29/23    Authorization Type $50 copay per day, not per discipline    Progress Note Due on Visit 10    OT Start Time 1400    OT Stop Time 1445    OT Time Calculation (min) 45 min    Equipment Utilized During Treatment transport chair    Activity Tolerance Patient tolerated treatment well    Behavior During Therapy [facility] for tasks assessed/performed             Past Medical History:  Diagnosis Date   Anxiety    Aortic atherosclerosis (HCC) 02/16/2017   Arthritis    Blind left eye    COPD (chronic obstructive pulmonary disease) (HCC)    Depression    Dysrhythmia    A-fib   GERD (gastroesophageal reflux disease)    Hypertension    Low TSH level 02/17/2017   Myocardial infarction [facility])    PAD (peripheral artery disease) (HCC)    Paroxysmal atrial fibrillation (HCC)    Perforated duodenal ulcer (HCC)    Stroke (HCC)    left side is weak   Past Surgical History:  Procedure Laterality Date   BRONCHIAL BIOPSY  03/22/2022   Procedure: BRONCHIAL BIOPSIES;  Surgeon: Isadora Hose, MD;  Location: [facility] ENDOSCOPY;  Service: Pulmonary;;   BRONCHIAL NEEDLE ASPIRATION BIOPSY  03/22/2022   Procedure: BRONCHIAL NEEDLE ASPIRATION BIOPSIES;  Surgeon: Isadora Hose, MD;  Location: [facility] ENDOSCOPY;  Service: Pulmonary;;   ENDOBRONCHIAL ULTRASOUND  03/22/2022   Procedure: ENDOBRONCHIAL ULTRASOUND;  Surgeon: Isadora Hose, MD;  Location: [facility] ENDOSCOPY;  Service: Pulmonary;;   EXPLORATORY LAPAROTOMY  05/12/2015   EYE SURGERY     FRACTURE SURGERY     LAPAROTOMY N/A 05/10/2015   Procedure: EXPLORATORY LAPAROTOMY;  Surgeon: Lynda Leos, MD;   Location: [facility] OR;  Service: General;  Laterality: N/A;   LEFT HEART CATH AND CORONARY ANGIOGRAPHY N/A 05/02/2016   Procedure: Left Heart Cath and Coronary Angiography;  Surgeon: Gordy Bergamo, MD;  Location: [facility] INVASIVE CV LAB;  Service: Cardiovascular;  Laterality: N/A;   LOWER EXTREMITY ANGIOGRAPHY Left 09/21/2021   Procedure: Lower Extremity Angiography;  Surgeon: Jama Cordella KANDICE, MD;  Location: [facility] INVASIVE CV LAB;  Service: Cardiovascular;  Laterality: Left;   Patient Active Problem List   Diagnosis Date Noted   Allergic rhinitis due to pollen 12/11/2022   Tinea cruris 12/11/2022   BPH associated with nocturia 12/11/2022   Chest pain, non-cardiac 04/11/2022   Adenocarcinoma of lower lobe of right lung (HCC) 03/28/2022   Pulmonary nodule 1 cm or greater in diameter 03/22/2022   Leg weakness, bilateral 02/09/2022   Osteopenia 10/13/2021   PAD (peripheral artery disease) (HCC) 09/21/2021   GERD without esophagitis 09/21/2021   Dyslipidemia 09/21/2021   Depression 09/21/2021   Atrial flutter (HCC) 09/21/2021   Chronic obstructive pulmonary disease (COPD) (HCC) 09/21/2021   Coronary artery disease 09/21/2021   Cerebrovascular accident (CVA) due to embolism of cerebral artery (HCC) 09/06/2021   Bilateral edema of lower extremity 08/05/2020   Vasovagal syncope 08/02/2020   Annual physical exam 12/26/2019   Hyperlipidemia 12/26/2019   Abrasion of right hand 12/26/2019  Metabolic syndrome 11/11/2019   Low TSH level 02/17/2017   Atrial flutter with rapid ventricular response (HCC) 02/16/2017   Cigarette smoker 02/16/2017   COPD with acute exacerbation (HCC) 02/16/2017   Elevated lactic acid level 02/16/2017   Aortic atherosclerosis (HCC) 02/16/2017   Productive cough    Hypoxia 05/24/2015   SOB (shortness of breath) 05/24/2015   COPD suggested by initial evaluation (HCC) 05/17/2015   Essential hypertension 05/17/2015   Smoking 05/17/2015   ONSET DATE: 08/10/23  REFERRING DIAG:  I63.9 (ICD-10-CM) - Cerebral infarction, unspecified   THERAPY DIAG:  Muscle weakness (generalized)  Other lack of coordination  Rationale for Evaluation and Treatment: Rehabilitation  SUBJECTIVE:  SUBJECTIVE STATEMENT:  Pt. had a long wait prior to his speech therapy appointment, prior to coming to OT this afternoon. Pt accompanied by: family member, Chief Operating Officer (pt's SIL)  PERTINENT HISTORY: Per chart: Hospital Course:   Gregory Lane is a 73 y.o. male with a pertinent past medical history of stroke with residual left-sided weakness, blindness left eye, A-fib on warfarin, COPD, HFpEF, HTN, current smoker, ethanol abuse, RLL adenocarcinoma s/p SBRT (2024) who was admitted to [facility] on 08/10/2023 for thrombectomy for left MCA occlusion .  # Ischemic stroke MCA territory s/p thrombectomy: LKN 08/09/23 15: 00. NIHSS was 11 on presentation to [facility] with points for global aphasia, right facial droop, right-sided weakness. Did not receive thrombolytics. Left M1/M2 occlusion on CTA. Status post mechanical thrombectomy on 6/20, TICI 3 after 5 passes. Etiology could include cardioembolic in the setting of subtherapeutic INR vs athlerosclerotic in the setting of demonstrated intracranial disease. PT/OT recommended AIR however denied from rehabilitation center within his network, discussed with our therapists and patient's family with ultimate decision to discharge home with home health, patient reportedly has rollator walker and shower chair at home, family confirmed they will be present for support and observatio   PRECAUTIONS: Fall  WEIGHT BEARING RESTRICTIONS: No  PAIN:   09/11/2023: Pt. Indicated 6/10 pain in the whole RUE using Wong-Baker Facial Grimace Scale. Pt. Caregiver reports that he has had pain in the right shoulder.  6/10 pain on faces scale  Are you having pain? Yes: NPRS scale: N/A (limited by expressive aphasia) Pain location: R wrist and hand Pain description: unable to  verbalize Aggravating factors: activity/pressure through the wrist/hand Relieving factors: rest  FALLS: Has patient fallen in last 6 months? Yes. Number of falls 1 when he had the stroke   LIVING ENVIRONMENT: Lives with: lives with their family; pt resides with his SIL, Elveria.  Elveria reports she and pt both lost their spouse's, so it's the 2 of them left. Lives in: 1 level home Stairs: 4 steps in the front and back and both have rails on both side  Has following equipment at home: cane, walker, 2 grab bars in the shower, standard toilet,   PLOF: Independent, relies on SIL for transportation   PATIENT GOALS: use the arm better and improve the speech   OBJECTIVE:  Note: Objective measures were completed at Evaluation unless otherwise noted.  HAND DOMINANCE: Right  ADLs: Overall ADLs: Elveria provides assist as needed Transfers/ambulation related to ADLs: indep without AD; occasional furniture or wall walking  Eating: starting to use R hand with utensils and eating, difficult to cut food, mild ataxia with RUE coordination hand to mouth Grooming: goes to barber for shaving, does not brush teeth (pt points to his 1 tooth) and shook his head when OT provided education on still cleaning mouth/gums/brushing  to prevent bacteria growth/infection UB Dressing: extra time for buttoning.  LB Dressing: extra time for clothing fasteners Toileting: caregiver reports pt uses the wall to help with transfers; modified indep with toileting (OT anticipates difficulty with hygiene and clothing management d/t coordination impairment). Bathing: distant supv-modified indep Tub Shower transfers: distant supv  Equipment: Grab bars  IADLs: Shopping: accompanied by SIL Light housekeeping: SIL manages Meal Prep: SIL manages; pt can manage cold meal prep Community mobility: using transport chair today to reach therapy gym Medication management: SIL sets meds in pill Chief Strategy Officer management: SIL  writes the checks, currently pt unable, pt has autopay on most bills  Handwriting: Pt able to print name with fair legibility, but left letters out (writing mostly limited by language deficits post CVA), as pt demonstrated good ability to hold pen and formulate letters.   POSTURE COMMENTS:  rounded shoulders and forward head  ACTIVITY TOLERANCE: Activity tolerance: TBD   UPPER EXTREMITY ROM:  L shoulder hx of arthritis, RUE (stroke affected side) WFL    Active ROM  Right eval Left eval  Shoulder flexion WFL 90  Shoulder abduction WFL 90  Shoulder adduction    Shoulder extension    Shoulder internal rotation WFL Substitution patterns to reach behind back  Shoulder external rotation WFL Substitution patterns to reach behind head  Middle trapezius    Lower trapezius    Elbow flexion    Elbow extension    Wrist flexion    Wrist extension    Wrist ulnar deviation    Wrist radial deviation    Wrist pronation    Wrist supination    (Blank rows = not tested)  UPPER EXTREMITY MMT:  RUE grossly 5/5 at the shoulder, elbow, wrist       L shoulder limited by arthritic pain, but able to withstand at least mild resistance from OT, L elbow, wrist grossly 5/5  HAND FUNCTION: Grip strength: Right: 56 lbs; Left: 56 lbs, Lateral pinch: Right: 16 lbs, Left: 12 lbs, and 3 point pinch: Right: 10 lbs, Left: 10 lbs  09/20/23 Grip strength: Right: 53 lbs; Left: 47 lbs, Lateral pinch: Right: 13 lbs, Left: 8 lbs, and 3 point pinch: Right: 10 lbs, Left: 9 lbs  COORDINATION:   Eval: Finger Nose Finger test: mild deficit RUE/decreased accuracy 9 Hole Peg test: Right: 1 min 2  sec; Left: 35 sec  09/20/23:  9 Hole Peg test: Right: 58 sec; Left: 33 sec   SENSATION: unable to accurately assess d/t language deficits  EDEMA: No visible edema  MUSCLE TONE: RUE: Within functional limits  COGNITION: Overall cognitive status: cognitive/communication deficits: globablly aphasic (see SLP note for  further details)  VISION:  Unable to accurately assess d/t cognitive/communication deficits; will continue to assess further within functional contexts Per chart: hx of blindness L eye  PERCEPTION: Not tested  PRAXIS: Impaired: Motor planning, mild-moderate RUE ataxia, mild apraxia  TREATMENT DATE: 09/20/23   Measurements were obtained and goals were reviewed with the Pt.   Self-care:  -Pt. Performed the ADL task of buttoning a shirt. Pt. was able to independently, and efficiently manipulate the buttons on the shirt, as well as the pocket buttons. Pt. Presents with difficulty fastening the top button 2/2 not being able to see the button.  Neuromuscular re-education:  -facilitated [facility] skills focused on manipulating nuts, and bolts on a bolt board. Emphasis was placed on screwing, and unscrewing nuts and bolts, and progressively working from larger to smaller items with vision occluded.    -Pt./caregiver education was provided about a HEP for [facility] skills using a visual handout indicating which exercises to do.       PATIENT EDUCATION: Education details: FMC exercises  Person educated: Patient and Haematologist: Explanation Education comprehension: verbalized understanding  HOME EXERCISE PROGRAM: Green theraputty exercises  GOALS: Goals reviewed with patient? Yes  SHORT TERM GOALS: Target date: 10/18/23  Pt will perform HEP for improving RUE strength and coordination for daily tasks.  Baseline: Eval: HEP not yet initiated Goal status: INITIAL   LONG TERM GOALS: Target date: 11/29/23  Pt will increase R grip strength by 5 or more lbs in order to securely hold and carry heavy ADL supplies, and work towards assumed PLOF based on dominant/non-dominant hand comparisons.  Baseline: Eval: R dominant 56 lbs (L 56 lbs) 09/20/2023: R: 53#, L: 47# Goal  status: INITIAL  2.  Pt will increase RUE GMC/FMC skills in order to safely and independently shave face with R dominant hand. Baseline: Eval: Requires assist from barber, per SIL 09/20/2023:  SIL reports Pt. Goes to someone to have shaving done for him now 2/2 safety concerns. Goal status: INITIAL  3.  Pt will increase R hand FMC skills in order to improve efficiency with clothing fasteners, as demonstrated by completion of 9 hole peg test in 45 sec or less using R dominant hand.  Baseline: Eval: R 1 min 2 sec (L 35 sec) 09/20/2023: 9 Hole Peg test: Right: 1 min 2  sec; Left: 35 sec Goal status: INITIAL  ASSESSMENT:  CLINICAL IMPRESSION:  Pt. arrived to therapy appearing frustrated being at therapy. Pt. moved the therapy items aside on the tabletop surface. Pt. declined to work on grip or pinch strengthening tasks. Pt. Was agreeable, but reluctant, to work on buttoning tasks. Pt. was able to button all buttons with the exception to the top button 2/2 difficulty seeing the button. Pt. Engaged in [facility] tasks using the bolt board, however tossed the bolts onto the top of the bolt board, requiring cues and redirection. Pt. Measurements were reassessed for grip strength, pinch strength, and FMC skills. Pt. has had a decrease in grip strength, and pinch strength since the initial evaluation. However, Pt. has improved with [facility] skills in both the right, and left hands. Pt.'s goals were reviewed with the Pt. and caregiver. A HEP was reviewed with the Pt. and caregiver for [facility] skills. Plan to review, and update HEP with the Pt./caregiver, and prepare for possible discharge from OT dependent upon Pt. disposition.  PERFORMANCE DEFICITS: in functional skills including ADLs, IADLs, coordination, dexterity, strength, Fine motor control, Gross motor control, mobility, body mechanics, decreased knowledge of precautions, decreased knowledge of use of DME, and UE functional use, cognitive skills including attention,  emotional, temperament/personality, and understand, and psychosocial skills including coping strategies, environmental adaptation, habits, interpersonal interactions, and routines and behaviors.   IMPAIRMENTS: are limiting  patient from ADLs, IADLs, and leisure.   CO-MORBIDITIES: has co-morbidities such as anxiety/depression/blindness L eye, HTN that affects occupational performance. Patient will benefit from skilled OT to address above impairments and improve overall function.  MODIFICATION OR ASSISTANCE TO COMPLETE EVALUATION: Min-Moderate modification of tasks or assist with assess necessary to complete an evaluation.  OT OCCUPATIONAL PROFILE AND HISTORY: Detailed assessment: Review of records and additional review of physical, cognitive, psychosocial history related to current functional performance.  CLINICAL DECISION MAKING: Moderate - several treatment options, min-mod task modification necessary  REHAB POTENTIAL: Good  EVALUATION COMPLEXITY: Moderate    PLAN:  OT FREQUENCY: 1-2x/week  OT DURATION: 12 weeks  PLANNED INTERVENTIONS: 97168 OT Re-evaluation, 97535 self care/ADL training, 02889 therapeutic exercise, 97530 therapeutic activity, 97112 neuromuscular re-education, 97140 manual therapy, 97116 gait training, 02989 moist heat, 97010 cryotherapy, 97129 Cognitive training (first 15 min), 02869 Cognitive training(each additional 15 min), 02249 Physical Performance Testing, passive range of motion, balance training, functional mobility training, visual/perceptual remediation/compensation, psychosocial skills training, energy conservation, coping strategies training, patient/family education, and DME and/or AE instructions  RECOMMENDED OTHER SERVICES: None at this time (Pt has poc in place for SLP services)  CONSULTED AND AGREED WITH PLAN OF CARE: Patient and family Adult nurse, Chief Operating Officer (SIL)  PLAN FOR NEXT SESSION: see above  Richardson Otter, MS, OTR/L   09/20/2023,  5:53 PM

## 2023-09-21 ENCOUNTER — Ambulatory Visit: Admitting: Urology

## 2023-09-25 ENCOUNTER — Ambulatory Visit: Admitting: Physical Therapy

## 2023-09-25 ENCOUNTER — Ambulatory Visit: Attending: Internal Medicine | Admitting: Speech Pathology

## 2023-09-25 ENCOUNTER — Ambulatory Visit: Admitting: Urology

## 2023-09-25 VITALS — BP 155/79 | HR 55

## 2023-09-25 DIAGNOSIS — R4701 Aphasia: Secondary | ICD-10-CM | POA: Insufficient documentation

## 2023-09-25 DIAGNOSIS — M6281 Muscle weakness (generalized): Secondary | ICD-10-CM | POA: Insufficient documentation

## 2023-09-25 DIAGNOSIS — R278 Other lack of coordination: Secondary | ICD-10-CM | POA: Diagnosis present

## 2023-09-25 DIAGNOSIS — N390 Urinary tract infection, site not specified: Secondary | ICD-10-CM

## 2023-09-25 DIAGNOSIS — R482 Apraxia: Secondary | ICD-10-CM | POA: Insufficient documentation

## 2023-09-25 DIAGNOSIS — R41841 Cognitive communication deficit: Secondary | ICD-10-CM | POA: Diagnosis present

## 2023-09-25 DIAGNOSIS — R972 Elevated prostate specific antigen [PSA]: Secondary | ICD-10-CM

## 2023-09-25 LAB — URINALYSIS, COMPLETE
Bilirubin, UA: NEGATIVE
Glucose, UA: NEGATIVE
Ketones, UA: NEGATIVE
Nitrite, UA: NEGATIVE
RBC, UA: NEGATIVE
Specific Gravity, UA: 1.025 (ref 1.005–1.030)
Urobilinogen, Ur: 0.2 mg/dL (ref 0.2–1.0)
pH, UA: 6 (ref 5.0–7.5)

## 2023-09-25 LAB — MICROSCOPIC EXAMINATION

## 2023-09-25 NOTE — Therapy (Signed)
 OUTPATIENT SPEECH LANGUAGE PATHOLOGY  SPEECH LANGUAGE TREATMENT NOTE   Patient Name: Gregory Lane MRN: [id number] DOB:[birth year], 73 y.o., male 38 Date: 09/25/2023  PCP: Denyse DELENA Bathe, MD REFERRING PROVIDER: Arland Earnie Pouch, MD   End of Session - 09/25/23 1100     Visit Number 6    Number of Visits 25    Date for SLP Re-Evaluation 11/20/23    Authorization Type [employer]    Progress Note Due on Visit 10    SLP Start Time 1015    SLP Stop Time  1045    SLP Time Calculation (min) 30 min    Activity Tolerance Patient tolerated treatment well          Past Medical History:  Diagnosis Date   Anxiety    Aortic atherosclerosis (HCC) 02/16/2017   Arthritis    Blind left eye    COPD (chronic obstructive pulmonary disease) (HCC)    Depression    Dysrhythmia    A-fib   GERD (gastroesophageal reflux disease)    Hypertension    Low TSH level 02/17/2017   Myocardial infarction [facility])    PAD (peripheral artery disease) (HCC)    Paroxysmal atrial fibrillation (HCC)    Perforated duodenal ulcer (HCC)    Stroke (HCC)    left side is weak   Past Surgical History:  Procedure Laterality Date   BRONCHIAL BIOPSY  03/22/2022   Procedure: BRONCHIAL BIOPSIES;  Surgeon: Isadora Hose, MD;  [facility];  Service: Pulmonary;;   BRONCHIAL NEEDLE ASPIRATION BIOPSY  03/22/2022   Procedure: BRONCHIAL NEEDLE ASPIRATION BIOPSIES;  Surgeon: Isadora Hose, MD;  [facility]: [facility];  Service: Pulmonary;;   ENDOBRONCHIAL ULTRASOUND  03/22/2022   Procedure: ENDOBRONCHIAL ULTRASOUND;  Surgeon: Isadora Hose, MD;  Location: [facility];  Service: Pulmonary;;   EXPLORATORY LAPAROTOMY  05/12/2015   EYE SURGERY     FRACTURE SURGERY     LAPAROTOMY N/A 05/10/2015   Procedure: EXPLORATORY LAPAROTOMY;  Surgeon: Lynda Leos, MD;  Location: [facility] OR;  Service: General;  Laterality: N/A;   LEFT HEART CATH AND CORONARY ANGIOGRAPHY N/A 05/02/2016   Procedure: Left Heart  Cath and Coronary Angiography;  Surgeon: Gordy Bergamo, MD;  Location: [facility] INVASIVE CV LAB;  Service: Cardiovascular;  Laterality: N/A;   LOWER EXTREMITY ANGIOGRAPHY Left 09/21/2021   Procedure: Lower Extremity Angiography;  Surgeon: Jama Cordella KANDICE, MD;  Location: [facility] INVASIVE CV LAB;  Service: Cardiovascular;  Laterality: Left;   Patient Active Problem List   Diagnosis Date Noted   Allergic rhinitis due to pollen 12/11/2022   Tinea cruris 12/11/2022   BPH associated with nocturia 12/11/2022   Chest pain, non-cardiac 04/11/2022   Adenocarcinoma of lower lobe of right lung (HCC) 03/28/2022   Pulmonary nodule 1 cm or greater in diameter 03/22/2022   Leg weakness, bilateral 02/09/2022   Osteopenia 10/13/2021   PAD (peripheral artery disease) (HCC) 09/21/2021   GERD without esophagitis 09/21/2021   Dyslipidemia 09/21/2021   Depression 09/21/2021   Atrial flutter (HCC) 09/21/2021   Chronic obstructive pulmonary disease (COPD) (HCC) 09/21/2021   Coronary artery disease 09/21/2021   Cerebrovascular accident (CVA) due to embolism of cerebral artery (HCC) 09/06/2021   Bilateral edema of lower extremity 08/05/2020   Vasovagal syncope 08/02/2020   Annual physical exam 12/26/2019   Hyperlipidemia 12/26/2019   Abrasion of right hand 12/26/2019   Metabolic syndrome 11/11/2019   Low TSH level 02/17/2017   Atrial flutter with rapid ventricular response (HCC) 02/16/2017  Cigarette smoker 02/16/2017   COPD with acute exacerbation (HCC) 02/16/2017   Elevated lactic acid level 02/16/2017   Aortic atherosclerosis (HCC) 02/16/2017   Productive cough    Hypoxia 05/24/2015   SOB (shortness of breath) 05/24/2015   COPD suggested by initial evaluation (HCC) 05/17/2015   Essential hypertension 05/17/2015   Smoking 05/17/2015    ONSET DATE: 08/09/2023   REFERRING DIAG: I63.9 (ICD-10-CM) - Cerebral infarction, unspecified   THERAPY DIAG:  Aphasia  Apraxia  Cognitive communication  deficit  Rationale for Evaluation and Treatment Rehabilitation  SUBJECTIVE:   PERTINENT HISTORY and DIAGNOSTIC FINDINGS: Pt is a 73 year old right handed male who [facility] ED on 08/09/2023 with stroke-like symptoms - slurred speech and difficulty answering questions and weakness to his right side extremities. CT head and subsequent CTA head and neck shows left M2 occlusion with large penumbra in the region. Pt transferred to [facility] for mechanical thrombectomy on 08/10/2023. Pt admitted at [facility] from 08/10/2023 thru 08/21/2023 with recommended for home health.   MRI brain: Remonstrated left MCA territory infarct primarily involving the parietal lobe.   Additional medical history includes: blindness left eye, A-fib on warfarin, COPD, HFpEF, HTN, current smoker, ethanol abuse, RLL adenocarcinoma s/p SBRT (2024)     PAIN:  Are you having pain? Unable to communicate  FALLS: Has patient fallen in last 6 months?  No  LIVING ENVIRONMENT: Lives with: sister-in-law Lives in: House/apartment  PLOF:  Level of assistance: Independent with ADLs, Independent with IADLs, Comment: poor health literacy, difficulty with reading and writing per family report Employment: Retired   PATIENT GOALS    per pt's sister-in-law, she hopes he will talk again  SUBJECTIVE STATEMENT: Pt with spontaneous intelligible greeting good, how you? Pt's sister-in-law states that pt has an appt with urology at 11am, session shorten as a result Pt accompanied by: sister-in-law  OBJECTIVE:   TODAY'S TREATMENT:  Skilled ST treatment session targeted pt's cognitive communication, aphasia and apraxia of speech goals. SLP facilitated session by providing the following interventions:  Counting 1-10: Pt counted x 2 with > 80% speech intelligibility Pt able to place correct number of pennies on number card 10 out of 10 Able to place DOW in the correct order when given written word   Pt is not stimulable for error  awareness even with audio-playback d/t receptive language and potential cognitive communication deficits  PATIENT EDUCATION: Education details: see above Person educated: Patient and his sister-in-law Education method: Explanation Education comprehension: needs further education  HOME EXERCISE PROGRAM:   Supplement verbal information with single written words    GOALS:  Goals reviewed with patient? Yes  SHORT TERM GOALS: Target date: 10 sessions  With maximal assistance, pt will select written word to express request for food/drink item in 7 out of 10 opportunities.  Baseline: Goal status: INITIAL  2.  With maximal multimodal assistance, pt will answer yes/no questions in 8 out of 10 opportunities.  Baseline:  Goal status: INITIAL  3.  With maximal multimodal assistance, pt will verbally approximate common object names in 5 out of 10 opportunities.  Baseline:  Goal status: INITIAL   LONG TERM GOALS: Target date: 11/20/2023  Pt will use multimodal communication and maximal assistance to communicate basic wants and needs in 2 out of 5 opportunities per pt and family report.  Baseline:  Goal status: INITIAL  2.  With Maximal A, patient/family will demonstrate understanding of the following concepts: aphasia, spontaneous recovery, communication vs conversation, strengths/strategies to  promote success, local resources by answering multiple choice questions with 50% accuracy when provided supported conversation in order to increase patient and caregiver's participation in medical care.  Baseline:  Goal status: INITIAL   ASSESSMENT:  CLINICAL IMPRESSION: Patient is a 73 y.o. right handed male who was seen today for a speech language treatment d/t left MCA CVA.   Pt presents with the appearance of global aphasia - potentially Wernicke's Aphasia. See below for details.  Given the severity of pt's aphasia, he is not able to communicate any basic wants/needs which (per his  family's report) results in increased pt frustration and agitation.   Verbal Communication Pt's verbal communication was fluent but nonsensical speech consisting of normal rate and rhythm but didn't convey meaning. Pt was not aware of unintelligible nature of speech with few rote utterances that were intelligible. As such, pt was not able to name any basic common objects or produce rote utterances such as DOW, MOY (with written cues) or sing Happy Birthday. No ability to repeat basic words observed.   Auditory Comprehension Pt with moderately impaired auditory comprehension - given obejct name pt was able to select appropriate object in field of 2 in 4 out of 6 opportunities decreasing to 3 out of 8 opportunities with a field of 3, answered basic yes/no questions related to himself correctly in 4 out of 6 opportunities and followed basic 1-step directions related to self in 2 out of 4 opportunities.   Written Language During an unsuccessful attempt at verbal communication, he took this writer's pencil and attempted to write x 1 but was not able to write more than two letters ET but was able to write his first name.   Reading Comprehension Pt was able to pair written word with correct object in field of 3 with 100% accuracy (15 out of 15 opportunities).   Pt continues to participate and also demonstrates increased number of rote off the cuff responses that are intelligible. Continued decreased awareness of verbal errors observed. See the above treatment note for details.   OBJECTIVE IMPAIRMENTS include expressive language, receptive language, aphasia, apraxia, and written language. These impairments are limiting patient from managing medications, managing appointments, managing finances, household responsibilities, ADLs/IADLs, and effectively communicating at home and in community. Factors affecting potential to achieve goals and functional outcome are severity of impairments, family/community  support, and significantly reduced health literacy. Patient will benefit from skilled SLP services to address above impairments and improve overall function.  REHAB POTENTIAL: Good  PLAN: SLP FREQUENCY: 1-2x/week  SLP DURATION: 12 weeks  PLANNED INTERVENTIONS: Language facilitation, Cueing hierachy, Internal/external aids, Functional tasks, Multimodal communication approach, SLP instruction and feedback, Compensatory strategies, and Patient/family education    Reise Hietala B. Rubbie, M.S., CCC-SLP, Tree surgeon Certified Brain Injury Specialist [facility]  [facility] Office [phone] Ascom [phone] Fax [phone]

## 2023-09-25 NOTE — Progress Notes (Signed)
 09/25/2023 5:25 PM   Gregory Lane 05-15-1950 [id number]  Referring provider: Albina GORMAN Dine, MD [address] [place],  [place] [address]  Chief Complaint  Patient presents with   Establish Care   Elevated PSA    HPI: Gregory Lane is a 73 y.o. male referred for evaluation of an elevated PSA.  His wife was with him today.  PSA levels: 4.1-11/2022; 5.1-5/20/2025 Prior PSA level: 2.55-10/29/2021 Lower urinary tract symptoms: Family history prostate cancer first-degree relatives: Past urologic history: Admitted [facility] 08/10/2023 with ischemic stroke MCA for thrombectomy.  Traumatic Foley catheter removal 08/11/2023 and Foley catheter was replaced without problems and subsequently removed on 6/28.  Seen in the ED 08/22/2023 for penile bleeding.  Was discharged from [facility] on Eliquis .  Evaluation in the ED negative for active bleeding and he states the blood has not recurred.  Urine culture was positive for Enterococcus.   PMH: Past Medical History:  Diagnosis Date   Anxiety    Aortic atherosclerosis (HCC) 02/16/2017   Arthritis    Blind left eye    COPD (chronic obstructive pulmonary disease) (HCC)    Depression    Dysrhythmia    A-fib   GERD (gastroesophageal reflux disease)    Hypertension    Low TSH level 02/17/2017   Myocardial infarction [facility])    PAD (peripheral artery disease) (HCC)    Paroxysmal atrial fibrillation (HCC)    Perforated duodenal ulcer (HCC)    Stroke (HCC)    left side is weak    Surgical History: Past Surgical History:  Procedure Laterality Date   BRONCHIAL BIOPSY  03/22/2022   Procedure: BRONCHIAL BIOPSIES;  Surgeon: Isadora Hose, MD;  [facility];  Service: Pulmonary;;   BRONCHIAL NEEDLE ASPIRATION BIOPSY  03/22/2022   Procedure: BRONCHIAL NEEDLE ASPIRATION BIOPSIES;  Surgeon: Isadora Hose, MD;  [facility];  Service: Pulmonary;;   ENDOBRONCHIAL ULTRASOUND  03/22/2022   Procedure: ENDOBRONCHIAL ULTRASOUND;  Surgeon:  Isadora Hose, MD;  [facility];  Service: Pulmonary;;   EXPLORATORY LAPAROTOMY  05/12/2015   EYE SURGERY     FRACTURE SURGERY     LAPAROTOMY N/A 05/10/2015   Procedure: EXPLORATORY LAPAROTOMY;  Surgeon: Lynda Leos, MD;  [facility]: [facility] OR;  Service: General;  Laterality: N/A;   LEFT HEART CATH AND CORONARY ANGIOGRAPHY N/A 05/02/2016   Procedure: Left Heart Cath and Coronary Angiography;  Surgeon: Gordy Bergamo, MD;  Location: [facility] INVASIVE CV LAB;  Service: Cardiovascular;  Laterality: N/A;   LOWER EXTREMITY ANGIOGRAPHY Left 09/21/2021   Procedure: Lower Extremity Angiography;  Surgeon: Jama Cordella MATSU, MD;  Location: [facility] INVASIVE CV LAB;  Service: Cardiovascular;  Laterality: Left;    Home Medications:  Allergies as of 09/25/2023   No Known Allergies      Medication List        Accurate as of September 25, 2023  5:25 PM. If you have any questions, ask your nurse or doctor.          apixaban  5 MG Tabs tablet Commonly known as: ELIQUIS  Take 5 mg by mouth.   atorvastatin  40 MG tablet Commonly known as: LIPITOR TAKE 1 TABLET BY MOUTH EVERY DAY   cetirizine  10 MG tablet Commonly known as: ZyrTEC  Allergy Take 1 tablet (10 mg total) by mouth daily.   clotrimazole -betamethasone  cream Commonly known as: LOTRISONE  Apply 1 Application topically 2 (two) times daily.   FLUoxetine  20 MG capsule Commonly known as: PROZAC  Take 2 capsules (40 mg total) by mouth daily.  fluticasone  50 MCG/ACT nasal spray Commonly known as: FLONASE  Place 1 spray into both nostrils daily.   furosemide  20 MG tablet Commonly known as: LASIX  Take 20 mg by mouth daily.   Klor-Con  M20 20 MEQ tablet Generic drug: potassium chloride  SA Take 20 mEq by mouth 2 (two) times daily.   losartan  50 MG tablet Commonly known as: Cozaar  Take 1 tablet (50 mg total) by mouth 2 (two) times daily.   pantoprazole  40 MG tablet Commonly known as: PROTONIX  Take 1 tablet (40 mg total) by mouth 2 (two) times  daily.   warfarin 4 MG tablet Commonly known as: COUMADIN  Take 1 tablet (4 mg total) by mouth daily. Take 1 daily Monday  through Friday. Do not take Saturday and Sunday.Take 4 mg by mouth daily.        Allergies: No Known Allergies  Family History: Family History  Problem Relation Age of Onset   Hypertension Mother    Dementia Mother    Cirrhosis Father    Cancer Father    Lung cancer Sister    Hypertension Sister    Arthritis Sister    Heart murmur Sister    Arrhythmia Sister     Social History:  reports that he has been smoking cigarettes. He has a 32.5 pack-year smoking history. He has never used smokeless tobacco. He reports current alcohol use of about 60.0 standard drinks of alcohol per week. He reports that he does not use drugs.   Physical Exam: BP (!) 155/79   Pulse (!) 55   Constitutional:  Alert and oriented, No acute distress. HEENT: [place] AT Respiratory: Normal respiratory effort, no increased work of breathing.    Assessment & Plan:    1.  Elevated PSA Urinalysis ordered and if pyuria present a urine culture will be ordered Prostate MRI if urine culture negative   [name], MD  [facility] [address] [place], [place] [address] [phone]

## 2023-09-26 LAB — PSA: Prostate Specific Ag, Serum: 5.9 ng/mL — ABNORMAL HIGH (ref 0.0–4.0)

## 2023-09-27 ENCOUNTER — Ambulatory Visit

## 2023-09-27 ENCOUNTER — Ambulatory Visit: Admitting: Speech Pathology

## 2023-09-28 LAB — CULTURE, URINE COMPREHENSIVE

## 2023-09-30 ENCOUNTER — Encounter: Payer: Self-pay | Admitting: Urology

## 2023-09-30 ENCOUNTER — Ambulatory Visit: Payer: Self-pay | Admitting: Urology

## 2023-10-01 ENCOUNTER — Ambulatory Visit (INDEPENDENT_AMBULATORY_CARE_PROVIDER_SITE_OTHER): Admitting: Internal Medicine

## 2023-10-01 VITALS — BP 160/84 | HR 61 | Temp 97.6°F | Ht 73.0 in | Wt 185.8 lb

## 2023-10-01 DIAGNOSIS — R4701 Aphasia: Secondary | ICD-10-CM | POA: Diagnosis not present

## 2023-10-01 DIAGNOSIS — E782 Mixed hyperlipidemia: Secondary | ICD-10-CM

## 2023-10-01 DIAGNOSIS — I6340 Cerebral infarction due to embolism of unspecified cerebral artery: Secondary | ICD-10-CM | POA: Diagnosis not present

## 2023-10-01 DIAGNOSIS — I10 Essential (primary) hypertension: Secondary | ICD-10-CM

## 2023-10-01 NOTE — Progress Notes (Signed)
 Established Patient Office Visit  Subjective:  Patient ID: Gregory Lane, male    DOB: 03-31-1950  Age: 73 y.o. MRN: [id number]  Chief Complaint  Patient presents with   Acute Visit    Discuss neuro referral.     No new complaints, here to discuss neurology referral. Speech is improving.     No other concerns at this time.   Past Medical History:  Diagnosis Date   Anxiety    Aortic atherosclerosis (HCC) 02/16/2017   Arthritis    Blind left eye    COPD (chronic obstructive pulmonary disease) (HCC)    Depression    Dysrhythmia    A-fib   GERD (gastroesophageal reflux disease)    Hypertension    Low TSH level 02/17/2017   Myocardial infarction [facility])    PAD (peripheral artery disease) (HCC)    Paroxysmal atrial fibrillation (HCC)    Perforated duodenal ulcer (HCC)    Stroke (HCC)    left side is weak    Past Surgical History:  Procedure Laterality Date   BRONCHIAL BIOPSY  03/22/2022   Procedure: BRONCHIAL BIOPSIES;  Surgeon: Isadora Hose, MD;  [facility];  Service: Pulmonary;;   BRONCHIAL NEEDLE ASPIRATION BIOPSY  03/22/2022   Procedure: BRONCHIAL NEEDLE ASPIRATION BIOPSIES;  Surgeon: Isadora Hose, MD;  Location: [facility];  Service: Pulmonary;;   ENDOBRONCHIAL ULTRASOUND  03/22/2022   Procedure: ENDOBRONCHIAL ULTRASOUND;  Surgeon: Isadora Hose, MD;  [facility]: [facility];  Service: Pulmonary;;   EXPLORATORY LAPAROTOMY  05/12/2015   EYE SURGERY     FRACTURE SURGERY     LAPAROTOMY N/A 05/10/2015   Procedure: EXPLORATORY LAPAROTOMY;  Surgeon: Lynda Leos, MD;  Location: [facility] OR;  Service: General;  Laterality: N/A;   LEFT HEART CATH AND CORONARY ANGIOGRAPHY N/A 05/02/2016   Procedure: Left Heart Cath and Coronary Angiography;  Surgeon: Gordy Bergamo, MD;  Location[facility] INVASIVE CV LAB;  Service: Cardiovascular;  Laterality: N/A;   LOWER EXTREMITY ANGIOGRAPHY Left 09/21/2021   Procedure: Lower Extremity Angiography;  Surgeon: Jama Cordella KANDICE, MD;   Location: [facility] INVASIVE CV LAB;  Service: Cardiovascular;  Laterality: Left;    Social History   Socioeconomic History   Marital status: Single    Spouse name: Not on file   Number of children: Not on file   Years of education: Not on file   Highest education level: Not on file  Occupational History   Not on file  Tobacco Use   Smoking status: Every Day    Current packs/day: 0.50    Average packs/day: 0.5 packs/day for 65.0 years (32.5 ttl pk-yrs)    Types: Cigarettes   Smokeless tobacco: Never   Tobacco comments:    0.5 PPD  Vaping Use   Vaping status: Never Used  Substance and Sexual Activity   Alcohol use: Yes    Alcohol/week: 60.0 standard drinks of alcohol    Types: 60 Standard drinks or equivalent per week    Comment: daily 12 pk of beer   Drug use: No   Sexual activity: Not Currently  Other Topics Concern   Not on file  Social History Narrative   [place] Veteran. Lives with sister and sister-in-law   Social Drivers of Health   Financial Resource Strain: Low Risk  (08/10/2023)   Received from [facility]   Overall Financial Resource Strain (CARDIA)    How hard is it for you to pay for the very basics like food, housing, medical care, and heating?: Not very  hard  Food Insecurity: No Food Insecurity (08/10/2023)   Received from [facility]   Hunger Vital Sign    Within the past 12 months, you worried that your food would run out before you got the money to buy more.: Never true    Within the past 12 months, the food you bought just didn't last and you didn't have money to get more.: Never true  Transportation Needs: No Transportation Needs (08/10/2023)   Received from [facility] - [facility]    Lack of Transportation (Medical): No    Lack of Transportation (Non-Medical): No  Physical Activity: Inactive (01/23/2022)   Exercise Vital Sign    Days of Exercise per Week: 0 days    Minutes of Exercise per Session: 0 min  Stress: No Stress  Concern Present (09/09/2021)   [employer] of Occupational Health - Occupational Stress Questionnaire    Feeling of Stress : Only a little  Social Connections: Socially Isolated (09/09/2021)   Social Connection and Isolation Panel    Frequency of Communication with Friends and Family: More than three times a week    Frequency of Social Gatherings with Friends and Family: More than three times a week    Attends Religious Services: Never    Database administrator or Organizations: No    Attends Banker Meetings: Never    Marital Status: Never married  Intimate Partner Violence: Not At Risk (03/28/2022)   Humiliation, Afraid, Rape, and Kick questionnaire    Fear of Current or Ex-Partner: No    Emotionally Abused: No    Physically Abused: No    Sexually Abused: No    Family History  Problem Relation Age of Onset   Hypertension Mother    Dementia Mother    Cirrhosis Father    Cancer Father    Lung cancer Sister    Hypertension Sister    Arthritis Sister    Heart murmur Sister    Arrhythmia Sister     No Known Allergies  Outpatient Medications Prior to Visit  Medication Sig   apixaban  (ELIQUIS ) 5 MG TABS tablet Take 5 mg by mouth.   atorvastatin  (LIPITOR) 40 MG tablet TAKE 1 TABLET BY MOUTH EVERY DAY   losartan  (COZAAR ) 50 MG tablet Take 1 tablet (50 mg total) by mouth 2 (two) times daily.   pantoprazole  (PROTONIX ) 40 MG tablet Take 1 tablet (40 mg total) by mouth 2 (two) times daily.   cetirizine  (ZYRTEC  ALLERGY) 10 MG tablet Take 1 tablet (10 mg total) by mouth daily. (Patient not taking: Reported on 10/01/2023)   clotrimazole -betamethasone  (LOTRISONE ) cream Apply 1 Application topically 2 (two) times daily. (Patient not taking: Reported on 10/01/2023)   FLUoxetine  (PROZAC ) 20 MG capsule Take 2 capsules (40 mg total) by mouth daily. (Patient not taking: Reported on 10/01/2023)   fluticasone  (FLONASE ) 50 MCG/ACT nasal spray Place 1 spray into both nostrils daily.  (Patient not taking: Reported on 10/01/2023)   furosemide  (LASIX ) 20 MG tablet Take 20 mg by mouth daily. (Patient not taking: Reported on 10/01/2023)   KLOR-CON  M20 20 MEQ tablet Take 20 mEq by mouth 2 (two) times daily. (Patient not taking: Reported on 10/01/2023)   warfarin (COUMADIN ) 4 MG tablet Take 1 tablet (4 mg total) by mouth daily. Take 1 daily Monday  through Friday. Do not take Saturday and Sunday.Take 4 mg by mouth daily. (Patient not taking: Reported on 10/01/2023)   No facility-administered medications prior to visit.  Review of Systems  Constitutional:  Negative for weight loss.  HENT: Negative.    Eyes: Negative.   Respiratory: Negative.    Cardiovascular: Negative.   Gastrointestinal: Negative.   Genitourinary: Negative.   Skin:  Positive for itching and rash.  Neurological: Negative.   Endo/Heme/Allergies: Negative.        Objective:   BP (!) 160/84   Pulse 61   Temp 97.6 F (36.4 C)   Ht 6' 1 (1.854 m)   Wt 185 lb 12.8 oz (84.3 kg)   SpO2 97%   BMI 24.51 kg/m   Vitals:   10/01/23 1407  BP: (!) 160/84  Pulse: 61  Temp: 97.6 F (36.4 C)  Height: 6' 1 (1.854 m)  Weight: 185 lb 12.8 oz (84.3 kg)  SpO2: 97%  BMI (Calculated): 24.52    Physical Exam Vitals reviewed.  Constitutional:      Appearance: Normal appearance.  HENT:     Head: Normocephalic.     Left Ear: There is no impacted cerumen.     Nose: Nose normal.     Mouth/Throat:     Mouth: Mucous membranes are moist.     Pharynx: No posterior oropharyngeal erythema.  Eyes:     Extraocular Movements: Extraocular movements intact.     Pupils: Pupils are equal, round, and reactive to light.  Cardiovascular:     Rate and Rhythm: Regular rhythm.     Chest Wall: PMI is not displaced.     Pulses: Normal pulses.     Heart sounds: Normal heart sounds. No murmur heard. Pulmonary:     Effort: Pulmonary effort is normal.     Breath sounds: Normal air entry. No rhonchi or rales.  Abdominal:      General: Abdomen is flat. Bowel sounds are normal. There is no distension.     Palpations: Abdomen is soft. There is no hepatomegaly, splenomegaly or mass.     Tenderness: There is no abdominal tenderness.  Musculoskeletal:     Left shoulder: Decreased range of motion. Decreased strength.     Cervical back: Normal range of motion and neck supple.     Right lower leg: No edema.     Left lower leg: No edema.  Skin:    General: Skin is warm and dry.  Neurological:     General: No focal deficit present.     Mental Status: He is alert and oriented to person, place, and time.     Cranial Nerves: No cranial nerve deficit.     Motor: No weakness.  Psychiatric:        Mood and Affect: Mood normal.        Behavior: Behavior normal.      No results found for any visits on 10/01/23.      Assessment & Plan:  BP still elevated on recheck. Patient and family instructed to check it at home and notify me if remains elevated Problem List Items Addressed This Visit       Cardiovascular and Mediastinum   Essential hypertension   Cerebrovascular accident (CVA) due to embolism of cerebral artery (HCC) - Primary   Relevant Orders   Ambulatory referral to Neurology     Other   Hyperlipidemia   Other Visit Diagnoses       Aphasia           No follow-ups on file.   Total time spent: 20 minutes  Sherrill Cinderella Perry, MD  10/01/2023   This document may  have been prepared by [employer] and as such may include unintentional dictation errors.

## 2023-10-02 ENCOUNTER — Ambulatory Visit: Admitting: Speech Pathology

## 2023-10-02 ENCOUNTER — Ambulatory Visit

## 2023-10-02 DIAGNOSIS — M6281 Muscle weakness (generalized): Secondary | ICD-10-CM

## 2023-10-02 DIAGNOSIS — R4701 Aphasia: Secondary | ICD-10-CM

## 2023-10-02 DIAGNOSIS — R278 Other lack of coordination: Secondary | ICD-10-CM

## 2023-10-02 DIAGNOSIS — R482 Apraxia: Secondary | ICD-10-CM

## 2023-10-02 NOTE — Therapy (Signed)
 OUTPATIENT SPEECH LANGUAGE PATHOLOGY  SPEECH LANGUAGE TREATMENT NOTE   Patient Name: Gregory Lane MRN: [id number] DOB:1951-01-16, 73 y.o., male 61 Date: 10/02/2023  PCP: Denyse DELENA Bathe, MD REFERRING PROVIDER: Arland Earnie Pouch, MD   End of Session - 10/02/23 1700     Visit Number 7    Number of Visits 25    Date for SLP Re-Evaluation 11/20/23    Authorization Type [employer] [facility] [place]    Progress Note Due on Visit 10    SLP Start Time 1445    SLP Stop Time  1530    SLP Time Calculation (min) 45 min    Activity Tolerance Patient tolerated treatment well          Past Medical History:  Diagnosis Date   Anxiety    Aortic atherosclerosis (HCC) 02/16/2017   Arthritis    Blind left eye    COPD (chronic obstructive pulmonary disease) (HCC)    Depression    Dysrhythmia    A-fib   GERD (gastroesophageal reflux disease)    Hypertension    Low TSH level 02/17/2017   Myocardial infarction [facility])    PAD (peripheral artery disease) (HCC)    Paroxysmal atrial fibrillation (HCC)    Perforated duodenal ulcer (HCC)    Stroke (HCC)    left side is weak   Past Surgical History:  Procedure Laterality Date   BRONCHIAL BIOPSY  03/22/2022   Procedure: BRONCHIAL BIOPSIES;  Surgeon: Isadora Hose, MD;  [facility];  Service: Pulmonary;;   BRONCHIAL NEEDLE ASPIRATION BIOPSY  03/22/2022   Procedure: BRONCHIAL NEEDLE ASPIRATION BIOPSIES;  Surgeon: Isadora Hose, MD;  [facility]: [facility];  Service: Pulmonary;;   ENDOBRONCHIAL ULTRASOUND  03/22/2022   Procedure: ENDOBRONCHIAL ULTRASOUND;  Surgeon: Isadora Hose, MD;  [facility];  Service: Pulmonary;;   EXPLORATORY LAPAROTOMY  05/12/2015   EYE SURGERY     FRACTURE SURGERY     LAPAROTOMY N/A 05/10/2015   Procedure: EXPLORATORY LAPAROTOMY;  Surgeon: Lynda Leos, MD;  [facility];  Service: General;  Laterality: N/A;   LEFT HEART CATH AND CORONARY ANGIOGRAPHY N/A 05/02/2016   Procedure: Left Heart  Cath and Coronary Angiography;  Surgeon: Gordy Bergamo, MD;  Location: [facility] INVASIVE CV LAB;  Service: Cardiovascular;  Laterality: N/A;   LOWER EXTREMITY ANGIOGRAPHY Left 09/21/2021   Procedure: Lower Extremity Angiography;  Surgeon: Jama Cordella KANDICE, MD;  Location: [facility] INVASIVE CV LAB;  Service: Cardiovascular;  Laterality: Left;   Patient Active Problem List   Diagnosis Date Noted   Allergic rhinitis due to pollen 12/11/2022   Tinea cruris 12/11/2022   BPH associated with nocturia 12/11/2022   Chest pain, non-cardiac 04/11/2022   Adenocarcinoma of lower lobe of right lung (HCC) 03/28/2022   Pulmonary nodule 1 cm or greater in diameter 03/22/2022   Leg weakness, bilateral 02/09/2022   Osteopenia 10/13/2021   PAD (peripheral artery disease) (HCC) 09/21/2021   GERD without esophagitis 09/21/2021   Dyslipidemia 09/21/2021   Depression 09/21/2021   Atrial flutter (HCC) 09/21/2021   Chronic obstructive pulmonary disease (COPD) (HCC) 09/21/2021   Coronary artery disease 09/21/2021   Cerebrovascular accident (CVA) due to embolism of cerebral artery (HCC) 09/06/2021   Bilateral edema of lower extremity 08/05/2020   Vasovagal syncope 08/02/2020   Annual physical exam 12/26/2019   Hyperlipidemia 12/26/2019   Abrasion of right hand 12/26/2019   Metabolic syndrome 11/11/2019   Low TSH level 02/17/2017   Atrial flutter with rapid ventricular response (HCC) 02/16/2017  Cigarette smoker 02/16/2017   COPD with acute exacerbation (HCC) 02/16/2017   Elevated lactic acid level 02/16/2017   Aortic atherosclerosis (HCC) 02/16/2017   Productive cough    Hypoxia 05/24/2015   SOB (shortness of breath) 05/24/2015   COPD suggested by initial evaluation (HCC) 05/17/2015   Essential hypertension 05/17/2015   Smoking 05/17/2015    ONSET DATE: 08/09/2023   REFERRING DIAG: I63.9 (ICD-10-CM) - Cerebral infarction, unspecified   THERAPY DIAG:  Aphasia  Apraxia  Rationale for Evaluation and Treatment  Rehabilitation  SUBJECTIVE:   PERTINENT HISTORY and DIAGNOSTIC FINDINGS: Pt is a 72 year old right handed male who presented [facility] ED on 08/09/2023 with stroke-like symptoms - slurred speech and difficulty answering questions and weakness to his right side extremities. CT head and subsequent CTA head and neck shows left M2 occlusion with large penumbra in the region. Pt transferred to [facility] for mechanical thrombectomy on 08/10/2023. Pt admitted at [facility] from 08/10/2023 thru 08/21/2023 with recommended for home health.   MRI brain: Remonstrated left MCA territory infarct primarily involving the parietal lobe.   Additional medical history includes: blindness left eye, A-fib on warfarin, COPD, HFpEF, HTN, current smoker, ethanol abuse, RLL adenocarcinoma s/p SBRT (2024)     PAIN:  Are you having pain? Unable to communicate  FALLS: Has patient fallen in last 6 months?  No  LIVING ENVIRONMENT: Lives with: sister-in-law Lives in: House/apartment  PLOF:  Level of assistance: Independent with ADLs, Independent with IADLs, Comment: poor health literacy, difficulty with reading and writing per family report Employment: Retired   PATIENT GOALS    per pt's sister-in-law, she hopes he will talk again  SUBJECTIVE STATEMENT: Pt with continued islands of intelligible speech Pt accompanied by: sister-in-law  OBJECTIVE:   TODAY'S TREATMENT:  Skilled ST treatment session targeted pt's cognitive communication, aphasia and apraxia of speech goals. SLP facilitated session by providing the following interventions:  Pt continues with increased islands of intelligible speech and improved speech intelligibility (~ 25%) when reading written list of basic items within categories - breakfast, beverages, leisure activities. Pt able to demonstrate reading comprehension and although he was reluctant to select from list of items he did so with cues. He avoided items that he didn't like. Extensive education  provided to pt's sister-in-law on providing written choices for breakfast, lunch, activities, drinks to have pt select from in an attempt to communicate and improve speech intelligibility. Return demonstration variable and pt's sister-in-law would benefit from further education/instruction.   Pt continues to demonstrate decreased understanding of communication impairment and neologistic speech but participates in therapy activities with responding if you say so.   PATIENT EDUCATION: Education details: see above Person educated: Patient and his sister-in-law Education method: Explanation Education comprehension: needs further education  HOME EXERCISE PROGRAM:   Supplement verbal information with single written words    GOALS:  Goals reviewed with patient? Yes  SHORT TERM GOALS: Target date: 10 sessions  With maximal assistance, pt will select written word to express request for food/drink item in 7 out of 10 opportunities.  Baseline: Goal status: INITIAL  2.  With maximal multimodal assistance, pt will answer yes/no questions in 8 out of 10 opportunities.  Baseline:  Goal status: INITIAL  3.  With maximal multimodal assistance, pt will verbally approximate common object names in 5 out of 10 opportunities.  Baseline:  Goal status: INITIAL   LONG TERM GOALS: Target date: 11/20/2023  Pt will use multimodal communication and maximal assistance to communicate  basic wants and needs in 2 out of 5 opportunities per pt and family report.  Baseline:  Goal status: INITIAL  2.  With Maximal A, patient/family will demonstrate understanding of the following concepts: aphasia, spontaneous recovery, communication vs conversation, strengths/strategies to promote success, local resources by answering multiple choice questions with 50% accuracy when provided supported conversation in order to increase patient and caregiver's participation in medical care.  Baseline:  Goal status:  INITIAL   ASSESSMENT:  CLINICAL IMPRESSION: Patient is a 73 y.o. right handed male who was seen today for a speech language treatment d/t left MCA CVA.   Pt presents with the appearance of global aphasia - potentially Wernicke's Aphasia. See below for details.  Given the severity of pt's aphasia, he is not able to communicate any basic wants/needs which (per his family's report) results in increased pt frustration and agitation.   Verbal Communication Pt's verbal communication was fluent but nonsensical speech consisting of normal rate and rhythm but didn't convey meaning. Pt was not aware of unintelligible nature of speech with few rote utterances that were intelligible. As such, pt was not able to name any basic common objects or produce rote utterances such as DOW, MOY (with written cues) or sing Happy Birthday. No ability to repeat basic words observed.   Auditory Comprehension Pt with moderately impaired auditory comprehension - given obejct name pt was able to select appropriate object in field of 2 in 4 out of 6 opportunities decreasing to 3 out of 8 opportunities with a field of 3, answered basic yes/no questions related to himself correctly in 4 out of 6 opportunities and followed basic 1-step directions related to self in 2 out of 4 opportunities.   Written Language During an unsuccessful attempt at verbal communication, he took this writer's pencil and attempted to write x 1 but was not able to write more than two letters ET but was able to write his first name.   Reading Comprehension Pt was able to pair written word with correct object in field of 3 with 100% accuracy (15 out of 15 opportunities).   Pt continues to participate and also demonstrates increased number of rote off the cuff responses that are intelligible. Continued decreased awareness of verbal errors observed. See the above treatment note for details.   OBJECTIVE IMPAIRMENTS include expressive language,  receptive language, aphasia, apraxia, and written language. These impairments are limiting patient from managing medications, managing appointments, managing finances, household responsibilities, ADLs/IADLs, and effectively communicating at home and in community. Factors affecting potential to achieve goals and functional outcome are severity of impairments, family/community support, and significantly reduced health literacy. Patient will benefit from skilled SLP services to address above impairments and improve overall function.  REHAB POTENTIAL: Good  PLAN: SLP FREQUENCY: 1-2x/week  SLP DURATION: 12 weeks  PLANNED INTERVENTIONS: Language facilitation, Cueing hierachy, Internal/external aids, Functional tasks, Multimodal communication approach, SLP instruction and feedback, Compensatory strategies, and Patient/family education    Laiylah Roettger B. Rubbie, M.S., CCC-SLP, Tree surgeon Certified Brain Injury Specialist [facility]  [facility] Services Office [phone] Ascom [phone] Fax [phone]

## 2023-10-02 NOTE — Therapy (Signed)
 OUTPATIENT OCCUPATIONAL THERAPY NEURO TREATMENT  Patient Name: Gregory Lane MRN: [id number] DOB:1950-12-29, 73 y.o., male Today's Date: 10/02/2023  PCP: Dr. Denyse Bathe REFERRING PROVIDER: Dr. Arland Earnie Pouch  END OF SESSION:       Past Medical History:  Diagnosis Date   Anxiety    Aortic atherosclerosis (HCC) 02/16/2017   Arthritis    Blind left eye    COPD (chronic obstructive pulmonary disease) (HCC)    Depression    Dysrhythmia    A-fib   GERD (gastroesophageal reflux disease)    Hypertension    Low TSH level 02/17/2017   Myocardial infarction [facility])    PAD (peripheral artery disease) (HCC)    Paroxysmal atrial fibrillation (HCC)    Perforated duodenal ulcer (HCC)    Stroke (HCC)    left side is weak   Past Surgical History:  Procedure Laterality Date   BRONCHIAL BIOPSY  03/22/2022   Procedure: BRONCHIAL BIOPSIES;  Surgeon: Isadora Hose, MD;  Location: [facility] ENDOSCOPY;  Service: Pulmonary;;   BRONCHIAL NEEDLE ASPIRATION BIOPSY  03/22/2022   Procedure: BRONCHIAL NEEDLE ASPIRATION BIOPSIES;  Surgeon: Isadora Hose, MD;  Location: [facility];  Service: Pulmonary;;   ENDOBRONCHIAL ULTRASOUND  03/22/2022   Procedure: ENDOBRONCHIAL ULTRASOUND;  Surgeon: Isadora Hose, MD;  Location: [facility];  Service: Pulmonary;;   EXPLORATORY LAPAROTOMY  05/12/2015   EYE SURGERY     FRACTURE SURGERY     LAPAROTOMY N/A 05/10/2015   Procedure: EXPLORATORY LAPAROTOMY;  Surgeon: Lynda Leos, MD;  Location: [facility] OR;  Service: General;  Laterality: N/A;   LEFT HEART CATH AND CORONARY ANGIOGRAPHY N/A 05/02/2016   Procedure: Left Heart Cath and Coronary Angiography;  Surgeon: Gordy Bergamo, MD;  Location: [facility] INVASIVE CV LAB;  Service: Cardiovascular;  Laterality: N/A;   LOWER EXTREMITY ANGIOGRAPHY Left 09/21/2021   Procedure: Lower Extremity Angiography;  Surgeon: Jama Cordella KANDICE, MD;  Location: [facility] INVASIVE CV LAB;  Service: Cardiovascular;  Laterality: Left;   Patient Active  Problem List   Diagnosis Date Noted   Allergic rhinitis due to pollen 12/11/2022   Tinea cruris 12/11/2022   BPH associated with nocturia 12/11/2022   Chest pain, non-cardiac 04/11/2022   Adenocarcinoma of lower lobe of right lung (HCC) 03/28/2022   Pulmonary nodule 1 cm or greater in diameter 03/22/2022   Leg weakness, bilateral 02/09/2022   Osteopenia 10/13/2021   PAD (peripheral artery disease) (HCC) 09/21/2021   GERD without esophagitis 09/21/2021   Dyslipidemia 09/21/2021   Depression 09/21/2021   Atrial flutter (HCC) 09/21/2021   Chronic obstructive pulmonary disease (COPD) (HCC) 09/21/2021   Coronary artery disease 09/21/2021   Cerebrovascular accident (CVA) due to embolism of cerebral artery (HCC) 09/06/2021   Bilateral edema of lower extremity 08/05/2020   Vasovagal syncope 08/02/2020   Annual physical exam 12/26/2019   Hyperlipidemia 12/26/2019   Abrasion of right hand 12/26/2019   Metabolic syndrome 11/11/2019   Low TSH level 02/17/2017   Atrial flutter with rapid ventricular response (HCC) 02/16/2017   Cigarette smoker 02/16/2017   COPD with acute exacerbation (HCC) 02/16/2017   Elevated lactic acid level 02/16/2017   Aortic atherosclerosis (HCC) 02/16/2017   Productive cough    Hypoxia 05/24/2015   SOB (shortness of breath) 05/24/2015   COPD suggested by initial evaluation (HCC) 05/17/2015   Essential hypertension 05/17/2015   Smoking 05/17/2015   ONSET DATE: 08/10/23  REFERRING DIAG: I63.9 (ICD-10-CM) - Cerebral infarction, unspecified   THERAPY DIAG:  No diagnosis found.  Rationale for Evaluation and Treatment:  Atrial flutter with rapid ventricular response (HCC) 02/16/2017   Cigarette smoker 02/16/2017   COPD with acute exacerbation (HCC) 02/16/2017   Elevated lactic acid level 02/16/2017   Aortic atherosclerosis (HCC) 02/16/2017   Productive cough    Hypoxia 05/24/2015   SOB (shortness of breath) 05/24/2015   COPD suggested by initial evaluation (HCC) 05/17/2015   Essential hypertension 05/17/2015   Smoking 05/17/2015   ONSET DATE: 08/10/23  REFERRING DIAG: I63.9 (ICD-10-CM) - Cerebral infarction,  unspecified   THERAPY DIAG:  Muscle weakness (generalized)  Other lack of coordination  Rationale for Evaluation and Treatment: Rehabilitation  SUBJECTIVE:  SUBJECTIVE STATEMENT: SIL acknowledges pt doing well at home and is using the RUE well with all daily tasks.  Pt accompanied by: family member, Chief Operating Officer (pt's SIL)  PERTINENT HISTORY: Per chart: Hospital Course:   Gregory Lane is a 73 y.o. male with a pertinent past medical history of stroke with residual left-sided weakness, blindness left eye, A-fib on warfarin, COPD, HFpEF, HTN, current smoker, ethanol abuse, RLL adenocarcinoma s/p SBRT (2024) who was admitted to [facility] on 08/10/2023 for thrombectomy for left MCA occlusion .  # Ischemic stroke MCA territory s/p thrombectomy: LKN 08/09/23 15: 00. NIHSS was 11 on presentation to [facility] with points for global aphasia, right facial droop, right-sided weakness. Did not receive thrombolytics. Left M1/M2 occlusion on CTA. Status post mechanical thrombectomy on 6/20, TICI 3 after 5 passes. Etiology could include cardioembolic in the setting of subtherapeutic INR vs athlerosclerotic in the setting of demonstrated intracranial disease. PT/OT recommended AIR however denied from rehabilitation center within his network, discussed with our therapists and patient's family with ultimate decision to discharge home with home health, patient reportedly has rollator walker and shower chair at home, family confirmed they will be present for support and observatio   PRECAUTIONS: Fall  WEIGHT BEARING RESTRICTIONS: No  PAIN: 10/02/23: 2/10 using Wong-Baker Facial Grimace Scale, with pt pointing and rubbing RUE 09/11/2023: Pt. Indicated 6/10 pain in the whole RUE using Wong-Baker Facial Grimace Scale. Pt. Caregiver reports that he has had pain in the right shoulder. 6/10 pain on faces scale  Are you having pain? Yes: NPRS scale: N/A (limited by expressive aphasia) Pain location: R wrist and hand Pain  description: unable to verbalize Aggravating factors: activity/pressure through the wrist/hand Relieving factors: rest  FALLS: Has patient fallen in last 6 months? Yes. Number of falls 1 when he had the stroke   LIVING ENVIRONMENT: Lives with: lives with their family; pt resides with his SIL, Elveria.  Elveria reports she and pt both lost their spouse's, so it's the 2 of them left. Lives in: 1 level home Stairs: 4 steps in the front and back and both have rails on both side  Has following equipment at home: cane, walker, 2 grab bars in the shower, standard toilet,   PLOF: Independent, relies on SIL for transportation   PATIENT GOALS: use the arm better and improve the speech   OBJECTIVE:  Note: Objective measures were completed at Evaluation unless otherwise noted.  HAND DOMINANCE: Right  ADLs: Overall ADLs: Elveria provides assist as needed Transfers/ambulation related to ADLs: indep without AD; occasional furniture or wall walking  Eating: starting to use R hand with utensils and eating, difficult to cut food, mild ataxia with RUE coordination hand to mouth Grooming: goes to barber for shaving, does not brush teeth (pt points to his 1 tooth) and shook his head when OT provided education on still cleaning mouth/gums/brushing to prevent  Handwriting: Pt able to print name with fair legibility, but left letters out (writing mostly limited by language deficits post CVA), as pt demonstrated good ability to hold pen and formulate letters.   POSTURE COMMENTS:  rounded shoulders and forward head  ACTIVITY TOLERANCE: Activity tolerance: TBD   UPPER EXTREMITY ROM:  L shoulder hx of arthritis, RUE (stroke affected side) WFL    Active ROM  Right eval Left eval  Shoulder flexion WFL 90  Shoulder  abduction WFL 90  Shoulder adduction    Shoulder extension    Shoulder internal rotation WFL Substitution patterns to reach behind back  Shoulder external rotation WFL Substitution patterns to reach behind head  Middle trapezius    Lower trapezius    Elbow flexion    Elbow extension    Wrist flexion    Wrist extension    Wrist ulnar deviation    Wrist radial deviation    Wrist pronation    Wrist supination    (Blank rows = not tested)  UPPER EXTREMITY MMT:  RUE grossly 5/5 at the shoulder, elbow, wrist       L shoulder limited by arthritic pain, but able to withstand at least mild resistance from OT, L elbow, wrist grossly 5/5  HAND FUNCTION: Grip strength: Right: 56 lbs; Left: 56 lbs, Lateral pinch: Right: 16 lbs, Left: 12 lbs, and 3 point pinch: Right: 10 lbs, Left: 10 lbs  09/20/23 Grip strength: Right: 53 lbs; Left: 47 lbs, Lateral pinch: Right: 13 lbs, Left: 8 lbs, and 3 point pinch: Right: 10 lbs, Left: 9 lbs R lateral pinch: R 16 lbs, L 12 lbs, 3 point pinch: Right: 9 lbs, Left: 8 lbs.  COORDINATION:   Eval: Finger Nose Finger test: mild deficit RUE/decreased accuracy 9 Hole Peg test: Right: 1 min 2  sec; Left: 35 sec 10/02/23: Right: 38 sec; Left:   09/20/23:  9 Hole Peg test: Right: 58 sec; Left: 33 sec   SENSATION: unable to accurately assess d/t language deficits  EDEMA: No visible edema  MUSCLE TONE: RUE: Within functional limits  COGNITION: Overall cognitive status: cognitive/communication deficits: globablly aphasic (see SLP note for further details)  VISION:  Unable to accurately assess d/t cognitive/communication deficits; will continue to assess further within functional contexts Per chart: hx of blindness L eye  PERCEPTION: Not tested  PRAXIS: Impaired: Motor planning, mild-moderate RUE ataxia, mild apraxia                                                                                                                         TREATMENT DATE: 09/20/23    Measurements were obtained and goals were reviewed with the Pt.   Self-care:  -Pt. Performed the ADL task of buttoning a shirt. Pt. was able to independently, and efficiently manipulate the buttons on the shirt, as well as the pocket buttons. Pt. Presents with difficulty fastening the top button 2/2 not being able to see the button.  Neuromuscular re-education:  -facilitated  TREATMENT DATE: 10/02/23  Therapeutic Activity: Objective measures taken and goals updated and reviewed for discharge summary.  Self Care: -ADLs reviewed with pt and sister-in-law with good report of bimanual and single RUE use during ADLs.   -Demonstrated buttoning/unbuttoning shirt today without difficulty -HEP reviewed to identify continued focus areas for R grip strengthening and [facility] tasks  PATIENT EDUCATION: Education details: Progress towards goals, HEP review Person educated: Patient and Haematologist: Programmer, multimedia, demo Education comprehension: verbalized understanding, demonstrated understanding  HOME EXERCISE PROGRAM: Green theraputty exercises, R hand FMC exercises and activities (written handouts issued)  GOALS: Goals reviewed with patient? Yes  SHORT TERM GOALS: Target date: 10/18/23  Pt will perform HEP for improving RUE strength and coordination for daily tasks.  Baseline: Eval: HEP not yet initiated; 10/02/23: indep with R hand strengthening and coordination tasks Goal status: achieved   LONG TERM GOALS: Target date: 11/29/23  Pt will increase R grip strength by 5 or more lbs in order to securely hold and carry heavy ADL supplies, and work towards assumed PLOF based on dominant/non-dominant hand comparisons.  Baseline: Eval: R dominant 56 lbs (L 56 lbs) 09/20/2023:  R: 53#, L: 47#; 10/02/23: R 62 lbs, L grip 46 lbs  Goal status: achieved  2.  Pt will increase RUE GMC/FMC skills in order to safely and independently shave face with R dominant hand. Baseline: Eval: Requires assist from barber, per SIL 09/20/2023:  SIL reports Pt. Goes to someone to have shaving done for him now 2/2 safety concerns; 10/02/23: Pt now prefers barber, but does have electric safety razor if desired; pt does demo good improvement with RUE GMC/FMC skills as demonstrated ability to use R hand efficiently for self feeding, dressing, and bathing ADLs. Goal status: d/c  3.  Pt will increase R hand FMC skills in order to improve efficiency with clothing fasteners, as demonstrated by completion of 9 hole peg test in 45 sec or less using R dominant hand.  Baseline: Eval: R 1 min 2 sec (L 35 sec) 09/20/2023: 9 Hole Peg test: Right: 1 min 2  sec; Left: 35 sec; 10/02/23: R 38 sec; pt demos good efficiency with buttons today; SIL confirms pt is dressing independently without difficulties Goal status: achieved  ASSESSMENT:  CLINICAL IMPRESSION: Pt seen by OT for 6 sessions to address RUE weakness and coordination deficits in the dominant arm post CVA.  Sister-in-law Elveria present today and confirms pt is doing very well at home and managing all ADLs independently with good use of the R dominant hand.  Objective measures taken today to confirm improvements.  Pt has met all strength and coordination goals for the RUE (see above).  Pt is adherent to HEP to target R hand strength and coordination skills, and demonstrates independence with exercises and activities.  OT goals met.  No additional skilled OT indicated at this time.  Pt and caregiver both in agreement with plan.    PERFORMANCE DEFICITS: in functional skills including ADLs, IADLs, coordination, dexterity, strength, Fine motor control, Gross motor control, mobility, body mechanics, decreased knowledge of precautions, decreased knowledge of use of  DME, and UE functional use, cognitive skills including attention, emotional, temperament/personality, and understand, and psychosocial skills including coping strategies, environmental adaptation, habits, interpersonal interactions, and routines and behaviors.   IMPAIRMENTS: are limiting patient from ADLs, IADLs, and leisure.   CO-MORBIDITIES: has co-morbidities such as anxiety/depression/blindness L eye, HTN that affects occupational performance. Patient will benefit from skilled OT to address above impairments and  improve overall function.  MODIFICATION OR ASSISTANCE TO COMPLETE EVALUATION: Min-Moderate modification of tasks or assist with assess necessary to complete an evaluation.  OT OCCUPATIONAL PROFILE AND HISTORY: Detailed assessment: Review of records and additional review of physical, cognitive, psychosocial history related to current functional performance.  CLINICAL DECISION MAKING: Moderate - several treatment options, min-mod task modification necessary  REHAB POTENTIAL: Good  EVALUATION COMPLEXITY: Moderate  PLAN:  OT FREQUENCY: 1-2x/week  OT DURATION: 12 weeks  PLANNED INTERVENTIONS: 97168 OT Re-evaluation, 97535 self care/ADL training, 02889 therapeutic exercise, 97530 therapeutic activity, 97112 neuromuscular re-education, 97140 manual therapy, 97116 gait training, 02989 moist heat, 97010 cryotherapy, 97129 Cognitive training (first 15 min), 02869 Cognitive training(each additional 15 min), 02249 Physical Performance Testing, passive range of motion, balance training, functional mobility training, visual/perceptual remediation/compensation, psychosocial skills training, energy conservation, coping strategies training, patient/family education, and DME and/or AE instructions  RECOMMENDED OTHER SERVICES: None at this time (Pt has poc in place for SLP services)  CONSULTED AND AGREED WITH PLAN OF CARE: Patient and family Adult nurse, Chief Operating Officer (SIL)  PLAN FOR NEXT  SESSION: N/A; d/c this visit with goals met  Inocente Blazing, MS, OTR/L  10/03/2023, 9:18 PM

## 2023-10-04 ENCOUNTER — Ambulatory Visit: Admitting: Speech Pathology

## 2023-10-04 ENCOUNTER — Ambulatory Visit: Admitting: Physical Therapy

## 2023-10-04 DIAGNOSIS — R4701 Aphasia: Secondary | ICD-10-CM

## 2023-10-04 DIAGNOSIS — R482 Apraxia: Secondary | ICD-10-CM

## 2023-10-04 NOTE — Therapy (Signed)
 OUTPATIENT SPEECH LANGUAGE PATHOLOGY  SPEECH LANGUAGE TREATMENT NOTE   Patient Name: Gregory Lane MRN: [id number] DOB:1950/06/26, 73 y.o., male 48 Date: 10/04/2023  PCP: Denyse DELENA Bathe, MD REFERRING PROVIDER: Arland Earnie Pouch, MD   End of Session - 10/04/23 1648     Visit Number 8    Number of Visits 25    Date for SLP Re-Evaluation 11/20/23    Authorization Type [employer] -     Progress Note Due on Visit 10    SLP Start Time 1114    SLP Stop Time  1145    SLP Time Calculation (min) 31 min    Activity Tolerance Patient tolerated treatment well          Past Medical History:  Diagnosis Date   Anxiety    Aortic atherosclerosis (HCC) 02/16/2017   Arthritis    Blind left eye    COPD (chronic obstructive pulmonary disease) (HCC)    Depression    Dysrhythmia    A-fib   GERD (gastroesophageal reflux disease)    Hypertension    Low TSH level 02/17/2017   Myocardial infarction [facility])    PAD (peripheral artery disease) (HCC)    Paroxysmal atrial fibrillation (HCC)    Perforated duodenal ulcer (HCC)    Stroke (HCC)    left side is weak   Past Surgical History:  Procedure Laterality Date   BRONCHIAL BIOPSY  03/22/2022   Procedure: BRONCHIAL BIOPSIES;  Surgeon: Isadora Hose, MD;  Location: [facility];  Service: Pulmonary;;   BRONCHIAL NEEDLE ASPIRATION BIOPSY  03/22/2022   Procedure: BRONCHIAL NEEDLE ASPIRATION BIOPSIES;  Surgeon: Isadora Hose, MD;  Location: [facility];  Service: Pulmonary;;   ENDOBRONCHIAL ULTRASOUND  03/22/2022   Procedure: ENDOBRONCHIAL ULTRASOUND;  Surgeon: Isadora Hose, MD;  Location: [facility];  Service: Pulmonary;;   EXPLORATORY LAPAROTOMY  05/12/2015   EYE SURGERY     FRACTURE SURGERY     LAPAROTOMY N/A 05/10/2015   Procedure: EXPLORATORY LAPAROTOMY;  Surgeon: Lynda Leos, MD;  Location: [facility] OR;  Service: General;  Laterality: N/A;   LEFT HEART CATH AND CORONARY ANGIOGRAPHY N/A 05/02/2016   Procedure: Left Heart  Cath and Coronary Angiography;  Surgeon: Gordy Bergamo, MD;  Location: [facility] INVASIVE CV LAB;  Service: Cardiovascular;  Laterality: N/A;   LOWER EXTREMITY ANGIOGRAPHY Left 09/21/2021   Procedure: Lower Extremity Angiography;  Surgeon: Jama Cordella KANDICE, MD;  Location: [facility] INVASIVE CV LAB;  Service: Cardiovascular;  Laterality: Left;   Patient Active Problem List   Diagnosis Date Noted   Allergic rhinitis due to pollen 12/11/2022   Tinea cruris 12/11/2022   BPH associated with nocturia 12/11/2022   Chest pain, non-cardiac 04/11/2022   Adenocarcinoma of lower lobe of right lung (HCC) 03/28/2022   Pulmonary nodule 1 cm or greater in diameter 03/22/2022   Leg weakness, bilateral 02/09/2022   Osteopenia 10/13/2021   PAD (peripheral artery disease) (HCC) 09/21/2021   GERD without esophagitis 09/21/2021   Dyslipidemia 09/21/2021   Depression 09/21/2021   Atrial flutter (HCC) 09/21/2021   Chronic obstructive pulmonary disease (COPD) (HCC) 09/21/2021   Coronary artery disease 09/21/2021   Cerebrovascular accident (CVA) due to embolism of cerebral artery (HCC) 09/06/2021   Bilateral edema of lower extremity 08/05/2020   Vasovagal syncope 08/02/2020   Annual physical exam 12/26/2019   Hyperlipidemia 12/26/2019   Abrasion of right hand 12/26/2019   Metabolic syndrome 11/11/2019   Low TSH level 02/17/2017   Atrial flutter with rapid ventricular response (HCC) 02/16/2017  Cigarette smoker 02/16/2017   COPD with acute exacerbation (HCC) 02/16/2017   Elevated lactic acid level 02/16/2017   Aortic atherosclerosis (HCC) 02/16/2017   Productive cough    Hypoxia 05/24/2015   SOB (shortness of breath) 05/24/2015   COPD suggested by initial evaluation (HCC) 05/17/2015   Essential hypertension 05/17/2015   Smoking 05/17/2015    ONSET DATE: 08/09/2023   REFERRING DIAG: I63.9 (ICD-10-CM) - Cerebral infarction, unspecified   THERAPY DIAG:  Aphasia  Apraxia  Rationale for Evaluation and Treatment  Rehabilitation  SUBJECTIVE:   PERTINENT HISTORY and DIAGNOSTIC FINDINGS: Pt is a 73 year old right handed male who presented to [facility] ED on 08/09/2023 with stroke-like symptoms - slurred speech and difficulty answering questions and weakness to his right side extremities. CT head and subsequent CTA head and neck shows left M2 occlusion with large penumbra in the region. Pt transferred to [facility] for mechanical thrombectomy on 08/10/2023. Pt admitted at [facility] from 08/10/2023 thru 08/21/2023 with recommended for home health.   MRI brain: Remonstrated left MCA territory infarct primarily involving the parietal lobe.   Additional medical history includes: blindness left eye, A-fib on warfarin, COPD, HFpEF, HTN, current smoker, ethanol abuse, RLL adenocarcinoma s/p SBRT (2024)     PAIN:  Are you having pain? Unable to communicate  FALLS: Has patient fallen in last 6 months?  No  LIVING ENVIRONMENT: Lives with: sister-in-law Lives in: House/apartment  PLOF:  Level of assistance: Independent with ADLs, Independent with IADLs, Comment: poor health literacy, difficulty with reading and writing per family report Employment: Retired   PATIENT GOALS    per pt's sister-in-law, she hopes he will talk again  SUBJECTIVE STATEMENT: Pt with continued islands of intelligible speech Pt accompanied by: sister-in-law  OBJECTIVE:   TODAY'S TREATMENT:  Skilled ST treatment session targeted pt's cognitive communication, aphasia and apraxia of speech goals. SLP facilitated session by providing the following interventions:  Pt's sister-in-law brought in notebook of written choices. She reports that pt demonstrated good use of written information to select appropriate choices (mostly with meals) and that she added some additional written choices for leisure activities. Great progress and implementation of multi-modal communication.   Pt and his sister-in-law continue to benefit from skilled information  regarding fluent aphasia, unintelligible speech, receptive language deficits and pt's lack of awareness that he is producing nonsensical words. However during today's session, pt demonstrated slight increase in awareness when listening to replay of his speech. Will continue to target.   SLP further facilitated session by implementing Oral Reading for Language in Aphasia (ORLA) activities. Pt participated but no objective improvement in tasks.     PATIENT EDUCATION: Education details: see above Person educated: Patient and his sister-in-law Education method: Explanation Education comprehension: needs further education  HOME EXERCISE PROGRAM:   Supplement verbal information with single written words    GOALS:  Goals reviewed with patient? Yes  SHORT TERM GOALS: Target date: 10 sessions  With maximal assistance, pt will select written word to express request for food/drink item in 7 out of 10 opportunities.  Baseline: Goal status: INITIAL  2.  With maximal multimodal assistance, pt will answer yes/no questions in 8 out of 10 opportunities.  Baseline:  Goal status: INITIAL  3.  With maximal multimodal assistance, pt will verbally approximate common object names in 5 out of 10 opportunities.  Baseline:  Goal status: INITIAL   LONG TERM GOALS: Target date: 11/20/2023  Pt will use multimodal communication and maximal assistance to communicate basic  wants and needs in 2 out of 5 opportunities per pt and family report.  Baseline:  Goal status: INITIAL  2.  With Maximal A, patient/family will demonstrate understanding of the following concepts: aphasia, spontaneous recovery, communication vs conversation, strengths/strategies to promote success, local resources by answering multiple choice questions with 50% accuracy when provided supported conversation in order to increase patient and caregiver's participation in medical care.  Baseline:  Goal status:  INITIAL   ASSESSMENT:  CLINICAL IMPRESSION: Patient is a 73 y.o. right handed male who was seen today for a speech language treatment d/t left MCA CVA.   Pt presents with the appearance of global aphasia - potentially Wernicke's Aphasia. See below for details.  Given the severity of pt's aphasia, he is not able to communicate any basic wants/needs which (per his family's report) results in increased pt frustration and agitation.   Verbal Communication Pt's verbal communication was fluent but nonsensical speech consisting of normal rate and rhythm but didn't convey meaning. Pt was not aware of unintelligible nature of speech with few rote utterances that were intelligible. As such, pt was not able to name any basic common objects or produce rote utterances such as DOW, MOY (with written cues) or sing Happy Birthday. No ability to repeat basic words observed.   Auditory Comprehension Pt with moderately impaired auditory comprehension - given obejct name pt was able to select appropriate object in field of 2 in 4 out of 6 opportunities decreasing to 3 out of 8 opportunities with a field of 3, answered basic yes/no questions related to himself correctly in 4 out of 6 opportunities and followed basic 1-step directions related to self in 2 out of 4 opportunities.   Written Language During an unsuccessful attempt at verbal communication, he took this writer's pencil and attempted to write x 1 but was not able to write more than two letters ET but was able to write his first name.   Reading Comprehension Pt was able to pair written word with correct object in field of 3 with 100% accuracy (15 out of 15 opportunities).   Pt continues to participate and also demonstrates increased number of rote off the cuff responses that are intelligible. Continued decreased awareness of verbal errors observed. See the above treatment note for details.   OBJECTIVE IMPAIRMENTS include expressive language,  receptive language, aphasia, apraxia, and written language. These impairments are limiting patient from managing medications, managing appointments, managing finances, household responsibilities, ADLs/IADLs, and effectively communicating at home and in community. Factors affecting potential to achieve goals and functional outcome are severity of impairments, family/community support, and significantly reduced health literacy. Patient will benefit from skilled SLP services to address above impairments and improve overall function.  REHAB POTENTIAL: Good  PLAN: SLP FREQUENCY: 1-2x/week  SLP DURATION: 12 weeks  PLANNED INTERVENTIONS: Language facilitation, Cueing hierachy, Internal/external aids, Functional tasks, Multimodal communication approach, SLP instruction and feedback, Compensatory strategies, and Patient/family education    Janmarie Smoot B. Rubbie, M.S., CCC-SLP, Tree surgeon Certified Brain Injury Specialist [facility]  [facility] Rehabilitation Services Office [phone] Ascom [phone] Fax [phone]

## 2023-10-08 ENCOUNTER — Other Ambulatory Visit: Payer: Self-pay | Admitting: *Deleted

## 2023-10-08 ENCOUNTER — Ambulatory Visit: Admitting: Speech Pathology

## 2023-10-08 ENCOUNTER — Ambulatory Visit

## 2023-10-08 DIAGNOSIS — R4701 Aphasia: Secondary | ICD-10-CM | POA: Diagnosis not present

## 2023-10-08 DIAGNOSIS — R972 Elevated prostate specific antigen [PSA]: Secondary | ICD-10-CM

## 2023-10-08 DIAGNOSIS — R482 Apraxia: Secondary | ICD-10-CM

## 2023-10-08 NOTE — Therapy (Signed)
 OUTPATIENT SPEECH LANGUAGE PATHOLOGY  SPEECH LANGUAGE TREATMENT NOTE   Patient Name: Gregory Lane MRN: [id number] DOB:Jul 11, 1950, 73 y.o., male 53 Date: 10/08/2023  PCP: Denyse DELENA Bathe, MD REFERRING PROVIDER: Arland Earnie Pouch, MD   End of Session - 10/08/23 1242     Visit Number 9    Number of Visits 25    Date for SLP Re-Evaluation 11/20/23    Authorization Type [employer] - [place]    Progress Note Due on Visit 10    SLP Start Time 1150    SLP Stop Time  1230    SLP Time Calculation (min) 40 min    Activity Tolerance Patient tolerated treatment well          Past Medical History:  Diagnosis Date   Anxiety    Aortic atherosclerosis (HCC) 02/16/2017   Arthritis    Blind left eye    COPD (chronic obstructive pulmonary disease) (HCC)    Depression    Dysrhythmia    A-fib   GERD (gastroesophageal reflux disease)    Hypertension    Low TSH level 02/17/2017   Myocardial infarction [facility])    PAD (peripheral artery disease) (HCC)    Paroxysmal atrial fibrillation (HCC)    Perforated duodenal ulcer (HCC)    Stroke (HCC)    left side is weak   Past Surgical History:  Procedure Laterality Date   BRONCHIAL BIOPSY  03/22/2022   Procedure: BRONCHIAL BIOPSIES;  Surgeon: Isadora Hose, MD;  Location: [facility];  Service: Pulmonary;;   BRONCHIAL NEEDLE ASPIRATION BIOPSY  03/22/2022   Procedure: BRONCHIAL NEEDLE ASPIRATION BIOPSIES;  Surgeon: Isadora Hose, MD;  Location: [facility];  Service: Pulmonary;;   ENDOBRONCHIAL ULTRASOUND  03/22/2022   Procedure: ENDOBRONCHIAL ULTRASOUND;  Surgeon: Isadora Hose, MD;  Location: [facility];  Service: Pulmonary;;   EXPLORATORY LAPAROTOMY  05/12/2015   EYE SURGERY     FRACTURE SURGERY     LAPAROTOMY N/A 05/10/2015   Procedure: EXPLORATORY LAPAROTOMY;  Surgeon: Lynda Leos, MD;  Location: [facility] OR;  Service: General;  Laterality: N/A;   LEFT HEART CATH AND CORONARY ANGIOGRAPHY N/A 05/02/2016   Procedure: Left Heart  Cath and Coronary Angiography;  Surgeon: Gordy Bergamo, MD;  Location: [facility] INVASIVE CV LAB;  Service: Cardiovascular;  Laterality: N/A;   LOWER EXTREMITY ANGIOGRAPHY Left 09/21/2021   Procedure: Lower Extremity Angiography;  Surgeon: Jama Cordella KANDICE, MD;  Location: [facility] INVASIVE CV LAB;  Service: Cardiovascular;  Laterality: Left;   Patient Active Problem List   Diagnosis Date Noted   Allergic rhinitis due to pollen 12/11/2022   Tinea cruris 12/11/2022   BPH associated with nocturia 12/11/2022   Chest pain, non-cardiac 04/11/2022   Adenocarcinoma of lower lobe of right lung (HCC) 03/28/2022   Pulmonary nodule 1 cm or greater in diameter 03/22/2022   Leg weakness, bilateral 02/09/2022   Osteopenia 10/13/2021   PAD (peripheral artery disease) (HCC) 09/21/2021   GERD without esophagitis 09/21/2021   Dyslipidemia 09/21/2021   Depression 09/21/2021   Atrial flutter (HCC) 09/21/2021   Chronic obstructive pulmonary disease (COPD) (HCC) 09/21/2021   Coronary artery disease 09/21/2021   Cerebrovascular accident (CVA) due to embolism of cerebral artery (HCC) 09/06/2021   Bilateral edema of lower extremity 08/05/2020   Vasovagal syncope 08/02/2020   Annual physical exam 12/26/2019   Hyperlipidemia 12/26/2019   Abrasion of right hand 12/26/2019   Metabolic syndrome 11/11/2019   Low TSH level 02/17/2017   Atrial flutter with rapid ventricular response (HCC) 02/16/2017  Cigarette smoker 02/16/2017   COPD with acute exacerbation (HCC) 02/16/2017   Elevated lactic acid level 02/16/2017   Aortic atherosclerosis (HCC) 02/16/2017   Productive cough    Hypoxia 05/24/2015   SOB (shortness of breath) 05/24/2015   COPD suggested by initial evaluation (HCC) 05/17/2015   Essential hypertension 05/17/2015   Smoking 05/17/2015    ONSET DATE: 08/09/2023   REFERRING DIAG: I63.9 (ICD-10-CM) - Cerebral infarction, unspecified   THERAPY DIAG:  Aphasia  Apraxia  Rationale for Evaluation and Treatment  Rehabilitation  SUBJECTIVE:   PERTINENT HISTORY and DIAGNOSTIC FINDINGS: Pt is a 73 year old right handed male who presented to [facility] ED on 08/09/2023 with stroke-like symptoms - slurred speech and difficulty answering questions and weakness to his right side extremities. CT head and subsequent CTA head and neck shows left M2 occlusion with large penumbra in the region. Pt transferred to [facility] for mechanical thrombectomy on 08/10/2023. Pt admitted at [facility] from 08/10/2023 thru 08/21/2023 with recommended for home health.   MRI brain: Remonstrated left MCA territory infarct primarily involving the parietal lobe.   Additional medical history includes: blindness left eye, A-fib on warfarin, COPD, HFpEF, HTN, current smoker, ethanol abuse, RLL adenocarcinoma s/p SBRT (2024)     PAIN:  Are you having pain? Unable to communicate  FALLS: Has patient fallen in last 6 months?  No  LIVING ENVIRONMENT: Lives with: sister-in-law Lives in: House/apartment  PLOF:  Level of assistance: Independent with ADLs, Independent with IADLs, Comment: poor health literacy, difficulty with reading and writing per family report Employment: Retired   PATIENT GOALS    per pt's sister-in-law, she hopes he will talk again  SUBJECTIVE STATEMENT: Pt with continued islands of intelligible speech He can read by having a conversation is hard  Pt accompanied by: sister-in-law  OBJECTIVE:   TODAY'S TREATMENT:  Skilled ST treatment session targeted pt's cognitive communication, aphasia and apraxia of speech goals. SLP facilitated session by providing the following interventions:  SLP further facilitated session by implementing Oral Reading for Language in Aphasia (ORLA) activities. Pt participated but no objective improvement in tasks.  Pt able to sort printed words into 2 appropriate categories - furniture and animals - when his sister-in-law verbally asked him what do I sleep on?' Pt was not able to answer  given  verbal only but with written question and 3 choices, pt able to indicate correct answer    PATIENT EDUCATION: Education details: see above Person educated: Patient and his sister-in-law Education method: Explanation Education comprehension: needs further education  HOME EXERCISE PROGRAM:   Supplement verbal information with single written words    GOALS:  Goals reviewed with patient? Yes  SHORT TERM GOALS: Target date: 10 sessions  With maximal assistance, pt will select written word to express request for food/drink item in 7 out of 10 opportunities.  Baseline: Goal status: INITIAL  2.  With maximal multimodal assistance, pt will answer yes/no questions in 8 out of 10 opportunities.  Baseline:  Goal status: INITIAL  3.  With maximal multimodal assistance, pt will verbally approximate common object names in 5 out of 10 opportunities.  Baseline:  Goal status: INITIAL   LONG TERM GOALS: Target date: 11/20/2023  Pt will use multimodal communication and maximal assistance to communicate basic wants and needs in 2 out of 5 opportunities per pt and family report.  Baseline:  Goal status: INITIAL  2.  With Maximal A, patient/family will demonstrate understanding of the following concepts: aphasia, spontaneous recovery, communication  vs conversation, strengths/strategies to promote success, local resources by answering multiple choice questions with 50% accuracy when provided supported conversation in order to increase patient and caregiver's participation in medical care.  Baseline:  Goal status: INITIAL   ASSESSMENT:  CLINICAL IMPRESSION: Patient is a 73 y.o. right handed male who was seen today for a speech language treatment d/t left MCA CVA.   Pt presents with the appearance of global aphasia - potentially Wernicke's Aphasia. See below for details.  Given the severity of pt's aphasia, he is not able to communicate any basic wants/needs which (per his family's report)  results in increased pt frustration and agitation.   Verbal Communication Pt's verbal communication was fluent but nonsensical speech consisting of normal rate and rhythm but didn't convey meaning. Pt was not aware of unintelligible nature of speech with few rote utterances that were intelligible. As such, pt was not able to name any basic common objects or produce rote utterances such as DOW, MOY (with written cues) or sing Happy Birthday. No ability to repeat basic words observed.   Auditory Comprehension Pt with moderately impaired auditory comprehension - given obejct name pt was able to select appropriate object in field of 2 in 4 out of 6 opportunities decreasing to 3 out of 8 opportunities with a field of 3, answered basic yes/no questions related to himself correctly in 4 out of 6 opportunities and followed basic 1-step directions related to self in 2 out of 4 opportunities.   Written Language During an unsuccessful attempt at verbal communication, he took this writer's pencil and attempted to write x 1 but was not able to write more than two letters ET but was able to write his first name.   Reading Comprehension Pt was able to pair written word with correct object in field of 3 with 100% accuracy (15 out of 15 opportunities).   Pt continues to participate and also demonstrates increased number of rote off the cuff responses that are intelligible. Continued decreased awareness of verbal errors observed. Receptive to attempts to copy written words - intermittent difficulty but good self-monitoring observed.  See the above treatment note for details.   OBJECTIVE IMPAIRMENTS include expressive language, receptive language, aphasia, apraxia, and written language. These impairments are limiting patient from managing medications, managing appointments, managing finances, household responsibilities, ADLs/IADLs, and effectively communicating at home and in community. Factors affecting  potential to achieve goals and functional outcome are severity of impairments, family/community support, and significantly reduced health literacy. Patient will benefit from skilled SLP services to address above impairments and improve overall function.  REHAB POTENTIAL: Good  PLAN: SLP FREQUENCY: 1-2x/week  SLP DURATION: 12 weeks  PLANNED INTERVENTIONS: Language facilitation, Cueing hierachy, Internal/external aids, Functional tasks, Multimodal communication approach, SLP instruction and feedback, Compensatory strategies, and Patient/family education    Teodor Prater B. Rubbie, M.S., CCC-SLP, Tree surgeon Certified Brain Injury Specialist [facility]  [facility] Rehabilitation Services Office [phone] Ascom [phone] Fax [phone]

## 2023-10-09 ENCOUNTER — Ambulatory Visit: Admitting: Physical Therapy

## 2023-10-09 ENCOUNTER — Encounter: Admitting: Speech Pathology

## 2023-10-10 ENCOUNTER — Encounter

## 2023-10-10 ENCOUNTER — Ambulatory Visit: Admitting: Speech Pathology

## 2023-10-10 DIAGNOSIS — R4701 Aphasia: Secondary | ICD-10-CM | POA: Diagnosis not present

## 2023-10-10 DIAGNOSIS — R482 Apraxia: Secondary | ICD-10-CM

## 2023-10-10 NOTE — Therapy (Unsigned)
 OUTPATIENT SPEECH LANGUAGE PATHOLOGY  SPEECH LANGUAGE TREATMENT NOTE 10TH VISIT PROGRESS NOTE   Patient Name: Gregory Lane MRN: [id number] DOB:11-18-1950, 73 y.o., male 42 Date: 10/10/2023  PCP: Denyse DELENA Bathe, MD REFERRING PROVIDER: Arland Earnie Pouch, MD  Speech Therapy Progress Note  Dates of Reporting Period: 08/28/2023 to 10/10/2023  Objective: Patient has been seen for 10 speech therapy sessions this reporting period targeting severe aphasia. Patient is making progress toward LTGs and STGs this reporting period. See skilled intervention, clinical impressions, and goals below for details.    End of Session - 10/10/23 1444     Visit Number 10    Number of Visits 25    Date for SLP Re-Evaluation 11/20/23    Authorization Type [employer] [place]    Progress Note Due on Visit 10    SLP Start Time 1400    SLP Stop Time  1445    SLP Time Calculation (min) 45 min    Activity Tolerance Patient tolerated treatment well          Past Medical History:  Diagnosis Date   Anxiety    Aortic atherosclerosis (HCC) 02/16/2017   Arthritis    Blind left eye    COPD (chronic obstructive pulmonary disease) (HCC)    Depression    Dysrhythmia    A-fib   GERD (gastroesophageal reflux disease)    Hypertension    Low TSH level 02/17/2017   Myocardial infarction [facility])    PAD (peripheral artery disease) (HCC)    Paroxysmal atrial fibrillation (HCC)    Perforated duodenal ulcer (HCC)    Stroke (HCC)    left side is weak   Past Surgical History:  Procedure Laterality Date   BRONCHIAL BIOPSY  03/22/2022   Procedure: BRONCHIAL BIOPSIES;  Surgeon: Isadora Hose, MD;  [facility]: [facility];  Service: Pulmonary;;   BRONCHIAL NEEDLE ASPIRATION BIOPSY  03/22/2022   Procedure: BRONCHIAL NEEDLE ASPIRATION BIOPSIES;  Surgeon: Isadora Hose, MD;  Location: [facility];  Service: Pulmonary;;   ENDOBRONCHIAL ULTRASOUND  03/22/2022   Procedure: ENDOBRONCHIAL ULTRASOUND;  Surgeon:  Isadora Hose, MD;  Location: [facility];  Service: Pulmonary;;   EXPLORATORY LAPAROTOMY  05/12/2015   EYE SURGERY     FRACTURE SURGERY     LAPAROTOMY N/A 05/10/2015   Procedure: EXPLORATORY LAPAROTOMY;  Surgeon: Lynda Leos, MD;  Location: [facility] OR;  Service: General;  Laterality: N/A;   LEFT HEART CATH AND CORONARY ANGIOGRAPHY N/A 05/02/2016   Procedure: Left Heart Cath and Coronary Angiography;  Surgeon: Gordy Bergamo, MD;  Location: [facility] INVASIVE CV LAB;  Service: Cardiovascular;  Laterality: N/A;   LOWER EXTREMITY ANGIOGRAPHY Left 09/21/2021   Procedure: Lower Extremity Angiography;  Surgeon: Jama Cordella KANDICE, MD;  Location: [facility] INVASIVE CV LAB;  Service: Cardiovascular;  Laterality: Left;   Patient Active Problem List   Diagnosis Date Noted   Allergic rhinitis due to pollen 12/11/2022   Tinea cruris 12/11/2022   BPH associated with nocturia 12/11/2022   Chest pain, non-cardiac 04/11/2022   Adenocarcinoma of lower lobe of right lung (HCC) 03/28/2022   Pulmonary nodule 1 cm or greater in diameter 03/22/2022   Leg weakness, bilateral 02/09/2022   Osteopenia 10/13/2021   PAD (peripheral artery disease) (HCC) 09/21/2021   GERD without esophagitis 09/21/2021   Dyslipidemia 09/21/2021   Depression 09/21/2021   Atrial flutter (HCC) 09/21/2021   Chronic obstructive pulmonary disease (COPD) (HCC) 09/21/2021   Coronary artery disease 09/21/2021   Cerebrovascular accident (CVA) due to embolism of cerebral  artery (HCC) 09/06/2021   Bilateral edema of lower extremity 08/05/2020   Vasovagal syncope 08/02/2020   Annual physical exam 12/26/2019   Hyperlipidemia 12/26/2019   Abrasion of right hand 12/26/2019   Metabolic syndrome 11/11/2019   Low TSH level 02/17/2017   Atrial flutter with rapid ventricular response (HCC) 02/16/2017   Cigarette smoker 02/16/2017   COPD with acute exacerbation (HCC) 02/16/2017   Elevated lactic acid level 02/16/2017   Aortic atherosclerosis (HCC) 02/16/2017    Productive cough    Hypoxia 05/24/2015   SOB (shortness of breath) 05/24/2015   COPD suggested by initial evaluation (HCC) 05/17/2015   Essential hypertension 05/17/2015   Smoking 05/17/2015    ONSET DATE: 08/09/2023   REFERRING DIAG: I63.9 (ICD-10-CM) - Cerebral infarction, unspecified   THERAPY DIAG:  Aphasia  Apraxia  Rationale for Evaluation and Treatment Rehabilitation  SUBJECTIVE:   PERTINENT HISTORY and DIAGNOSTIC FINDINGS: Pt is a 73 year old right handed male who presented to [facility] ED on 08/09/2023 with stroke-like symptoms - slurred speech and difficulty answering questions and weakness to his right side extremities. CT head and subsequent CTA head and neck shows left M2 occlusion with large penumbra in the region. Pt transferred to [facility] for mechanical thrombectomy on 08/10/2023. Pt admitted at [facility] from 08/10/2023 thru 08/21/2023 with recommended for home health.   MRI brain: Remonstrated left MCA territory infarct primarily involving the parietal lobe.   Additional medical history includes: blindness left eye, A-fib on warfarin, COPD, HFpEF, HTN, current smoker, ethanol abuse, RLL adenocarcinoma s/p SBRT (2024)     PAIN:  Are you having pain? Unable to communicate  FALLS: Has patient fallen in last 6 months?  No  LIVING ENVIRONMENT: Lives with: sister-in-law Lives in: House/apartment  PLOF:  Level of assistance: Independent with ADLs, Independent with IADLs, Comment: poor health literacy, difficulty with reading and writing per family report Employment: Retired   PATIENT GOALS    per pt's sister-in-law, she hopes he will talk again  SUBJECTIVE STATEMENT: Pt with continued islands of intelligible speech - pt brought in worksheets that he had worked on for homework Pt accompanied by: sister-in-law  OBJECTIVE:   TODAY'S TREATMENT:  Skilled ST treatment session targeted pt's cognitive communication, aphasia and apraxia of speech goals. SLP facilitated  session by providing the following interventions:  Pt with increased task tolerance, participation, smiling and laugh observed  Pt with improved ability to write some letters within words that are common to him given maximal multimodal cues.   Intermittent pt frowning observed in response to some unintelligible speech but largely unaware of errors   Intermittent ability to select appropriate written word given verbal stimuli    PATIENT EDUCATION: Education details: see above Person educated: Patient and his sister-in-law Education method: Explanation Education comprehension: needs further education  HOME EXERCISE PROGRAM:   Supplement verbal information with single written words    GOALS:  Goals reviewed with patient? Yes  SHORT TERM GOALS: Target date: 10 sessions  Updated: 10/10/2023 With maximal assistance, pt will select written word to express request for food/drink item in 7 out of 10 opportunities.  Baseline: Goal status: INITIAL: MET  2.  With maximal multimodal assistance, pt will answer yes/no questions in 8 out of 10 opportunities.  Baseline:  Goal status: INITIAL: Is able to answer using written language when choosing between written yes/no  3.  With maximal multimodal assistance, pt will verbally approximate common object names in 5 out of 10 opportunities.  Baseline:  Goal  status: INITIAL: progress made   LONG TERM GOALS: Target date: 11/20/2023  Updated 10/10/2023 Pt will use multimodal communication and maximal assistance to communicate basic wants and needs in 2 out of 5 opportunities per pt and family report.  Baseline:  Goal status: INITIAL: progress  2.  With Maximal A, patient/family will demonstrate understanding of the following concepts: aphasia, spontaneous recovery, communication vs conversation, strengths/strategies to promote success, local resources by answering multiple choice questions with 50% accuracy when provided supported  conversation in order to increase patient and caregiver's participation in medical care.  Baseline:  Goal status: INITIAL: progress   ASSESSMENT:  CLINICAL IMPRESSION: Patient is a 73 y.o. right handed male who was seen today for a speech language treatment d/t left MCA CVA.   Pt presents with the appearance of global aphasia - potentially Wernicke's Aphasia. See below for details.  Given the severity of pt's aphasia, he is not able to communicate any basic wants/needs which (per his family's report) results in increased pt frustration and agitation.   Verbal Communication Pt's verbal communication was fluent but nonsensical speech consisting of normal rate and rhythm but didn't convey meaning. Pt was not aware of unintelligible nature of speech with few rote utterances that were intelligible. As such, pt was not able to name any basic common objects or produce rote utterances such as DOW, MOY (with written cues) or sing Happy Birthday. No ability to repeat basic words observed.   Auditory Comprehension Pt with moderately impaired auditory comprehension - given obejct name pt was able to select appropriate object in field of 2 in 4 out of 6 opportunities decreasing to 3 out of 8 opportunities with a field of 3, answered basic yes/no questions related to himself correctly in 4 out of 6 opportunities and followed basic 1-step directions related to self in 2 out of 4 opportunities.   Written Language During an unsuccessful attempt at verbal communication, he took this writer's pencil and attempted to write x 1 but was not able to write more than two letters ET but was able to write his first name.   Reading Comprehension Pt was able to pair written word with correct object in field of 3 with 100% accuracy (15 out of 15 opportunities).   Pt continues to participate and also demonstrates increased number of rote off the cuff responses that are intelligible. Continued decreased awareness of  verbal errors observed. Receptive to attempts to copy written words - intermittent difficulty but good self-monitoring observed.  See the above treatment note for details.   OBJECTIVE IMPAIRMENTS include expressive language, receptive language, aphasia, apraxia, and written language. These impairments are limiting patient from managing medications, managing appointments, managing finances, household responsibilities, ADLs/IADLs, and effectively communicating at home and in community. Factors affecting potential to achieve goals and functional outcome are severity of impairments, family/community support, and significantly reduced health literacy. Patient will benefit from skilled SLP services to address above impairments and improve overall function.  REHAB POTENTIAL: Good  PLAN: SLP FREQUENCY: 1-2x/week  SLP DURATION: 12 weeks  PLANNED INTERVENTIONS: Language facilitation, Cueing hierachy, Internal/external aids, Functional tasks, Multimodal communication approach, SLP instruction and feedback, Compensatory strategies, and Patient/family education    Ovida Delagarza B. Rubbie, M.S., CCC-SLP, Tree surgeon Certified Brain Injury Specialist [facility]  [facility] Rehabilitation Services Office [phone] Ascom [phone] Fax [phone]

## 2023-10-11 ENCOUNTER — Encounter: Admitting: Speech Pathology

## 2023-10-11 ENCOUNTER — Ambulatory Visit

## 2023-10-15 ENCOUNTER — Encounter

## 2023-10-15 ENCOUNTER — Ambulatory Visit: Admitting: Speech Pathology

## 2023-10-15 DIAGNOSIS — R4701 Aphasia: Secondary | ICD-10-CM

## 2023-10-15 DIAGNOSIS — R482 Apraxia: Secondary | ICD-10-CM

## 2023-10-15 NOTE — Therapy (Signed)
 OUTPATIENT SPEECH LANGUAGE PATHOLOGY  SPEECH LANGUAGE TREATMENT NOTE   Patient Name: Gregory Lane MRN: [id number] DOB:05-03-1950, 73 y.o., male 39 Date: 10/15/2023  PCP: Gregory DELENA Bathe, MD REFERRING PROVIDER: Arland Earnie Pouch, MD    End of Session - 10/15/23 1356     Visit Number 11    Number of Visits 25    Date for SLP Re-Evaluation 11/20/23    Authorization Type [employer] [place]    Progress Note Due on Visit 10    SLP Start Time 1400    SLP Stop Time  1445    SLP Time Calculation (min) 45 min    Activity Tolerance Patient tolerated treatment well          Past Medical History:  Diagnosis Date   Anxiety    Aortic atherosclerosis (HCC) 02/16/2017   Arthritis    Blind left eye    COPD (chronic obstructive pulmonary disease) (HCC)    Depression    Dysrhythmia    A-fib   GERD (gastroesophageal reflux disease)    Hypertension    Low TSH level 02/17/2017   Myocardial infarction [facility])    PAD (peripheral artery disease) (HCC)    Paroxysmal atrial fibrillation (HCC)    Perforated duodenal ulcer (HCC)    Stroke (HCC)    left side is weak   Past Surgical History:  Procedure Laterality Date   BRONCHIAL BIOPSY  03/22/2022   Procedure: BRONCHIAL BIOPSIES;  Surgeon: Gregory Hose, MD;  [facility]: [facility];  Service: Pulmonary;;   BRONCHIAL NEEDLE ASPIRATION BIOPSY  03/22/2022   Procedure: BRONCHIAL NEEDLE ASPIRATION BIOPSIES;  Surgeon: Gregory Hose, MD;  [facility]: [facility];  Service: Pulmonary;;   ENDOBRONCHIAL ULTRASOUND  03/22/2022   Procedure: ENDOBRONCHIAL ULTRASOUND;  Surgeon: Gregory Hose, MD;  [facility]: [facility];  Service: Pulmonary;;   EXPLORATORY LAPAROTOMY  05/12/2015   EYE SURGERY     FRACTURE SURGERY     LAPAROTOMY N/A 05/10/2015   Procedure: EXPLORATORY LAPAROTOMY;  Surgeon: Gregory Leos, MD;  Location: [facility] OR;  Service: General;  Laterality: N/A;   LEFT HEART CATH AND CORONARY ANGIOGRAPHY N/A 05/02/2016   Procedure: Left  Heart Cath and Coronary Angiography;  Surgeon: Gregory Bergamo, MD;  Location: [facility] INVASIVE CV LAB;  Service: Cardiovascular;  Laterality: N/A;   LOWER EXTREMITY ANGIOGRAPHY Left 09/21/2021   Procedure: Lower Extremity Angiography;  Surgeon: Gregory Cordella KANDICE, MD;  Location: [facility] INVASIVE CV LAB;  Service: Cardiovascular;  Laterality: Left;   Patient Active Problem List   Diagnosis Date Noted   Allergic rhinitis due to pollen 12/11/2022   Tinea cruris 12/11/2022   BPH associated with nocturia 12/11/2022   Chest pain, non-cardiac 04/11/2022   Adenocarcinoma of lower lobe of right lung (HCC) 03/28/2022   Pulmonary nodule 1 cm or greater in diameter 03/22/2022   Leg weakness, bilateral 02/09/2022   Osteopenia 10/13/2021   PAD (peripheral artery disease) (HCC) 09/21/2021   GERD without esophagitis 09/21/2021   Dyslipidemia 09/21/2021   Depression 09/21/2021   Atrial flutter (HCC) 09/21/2021   Chronic obstructive pulmonary disease (COPD) (HCC) 09/21/2021   Coronary artery disease 09/21/2021   Cerebrovascular accident (CVA) due to embolism of cerebral artery (HCC) 09/06/2021   Bilateral edema of lower extremity 08/05/2020   Vasovagal syncope 08/02/2020   Annual physical exam 12/26/2019   Hyperlipidemia 12/26/2019   Abrasion of right hand 12/26/2019   Metabolic syndrome 11/11/2019   Low TSH level 02/17/2017   Atrial flutter with rapid ventricular response (HCC) 02/16/2017  Cigarette smoker 02/16/2017   COPD with acute exacerbation (HCC) 02/16/2017   Elevated lactic acid level 02/16/2017   Aortic atherosclerosis (HCC) 02/16/2017   Productive cough    Hypoxia 05/24/2015   SOB (shortness of breath) 05/24/2015   COPD suggested by initial evaluation (HCC) 05/17/2015   Essential hypertension 05/17/2015   Smoking 05/17/2015    ONSET DATE: 08/09/2023   REFERRING DIAG: I63.9 (ICD-10-CM) - Cerebral infarction, unspecified   THERAPY DIAG:  Aphasia  Apraxia  Rationale for Evaluation and  Treatment Rehabilitation  SUBJECTIVE:   PERTINENT HISTORY and DIAGNOSTIC FINDINGS: Pt is a 73 year old right handed male who presented to [facility] ED on 08/09/2023 with stroke-like symptoms - slurred speech and difficulty answering questions and weakness to his right side extremities. CT head and subsequent CTA head and neck shows left M2 occlusion with large penumbra in the region. Pt transferred to [facility] for mechanical thrombectomy on 08/10/2023. Pt admitted at [facility] from 08/10/2023 thru 08/21/2023 with recommended for home health.   MRI brain: Remonstrated left MCA territory infarct primarily involving the parietal lobe.   Additional medical history includes: blindness left eye, A-fib on warfarin, COPD, HFpEF, HTN, current smoker, ethanol abuse, RLL adenocarcinoma s/p SBRT (2024)     PAIN:  Are you having pain? Unable to communicate  FALLS: Has patient fallen in last 6 months?  No  LIVING ENVIRONMENT: Lives with: sister-in-law Lives in: House/apartment  PLOF:  Level of assistance: Independent with ADLs, Independent with IADLs, Comment: poor health literacy, difficulty with reading and writing per family report Employment: Retired   PATIENT GOALS    per pt's sister-in-law, she hopes he will talk again  SUBJECTIVE STATEMENT: Pt with continued islands of intelligible speech - his sister-in-law reports that he was counting the pennies earlier today Pt accompanied by: sister-in-law  OBJECTIVE:   TODAY'S TREATMENT:  Skilled ST treatment session targeted pt's cognitive communication, aphasia and apraxia of speech goals. SLP facilitated session by providing the following interventions:  Pt with increased task tolerance, participation, smiling and laugh observed  Treatment of Wernicke's Aphasia Therapy Approach was utilized with 20 basic objects (pictures) - pt able to select from a field of 4 when given the verbal word with 100% - pt able to match word to picture in field of 6 with 100%  accuracy - improved speech approximation observed throughout the activity thru use of repetition  Pt with improved speech approximation when reading DOW - able to point to correct answer when asked what day is today, what day was yesterday?   Pt also able to improve speech intelligibility thru ORLA with choice of 3 printed words, pt able to indicated correct printed answer with improved ability to orally spell words as he wrote them   PATIENT EDUCATION: Education details: see above Person educated: Patient and his sister-in-law Education method: Explanation Education comprehension: needs further education  HOME EXERCISE PROGRAM:   Supplement verbal information with single written words    GOALS:  Goals reviewed with patient? Yes  SHORT TERM GOALS: Target date: 10 sessions  Updated: 10/10/2023 With maximal assistance, pt will select written word to express request for food/drink item in 7 out of 10 opportunities.  Baseline: Goal status: INITIAL: MET  2.  With maximal multimodal assistance, pt will answer yes/no questions in 8 out of 10 opportunities.  Baseline:  Goal status: INITIAL: Is able to answer using written language when choosing between written yes/no  3.  With maximal multimodal assistance, pt will verbally approximate  common object names in 5 out of 10 opportunities.  Baseline:  Goal status: INITIAL: progress made   LONG TERM GOALS: Target date: 11/20/2023  Updated 10/10/2023 Pt will use multimodal communication and maximal assistance to communicate basic wants and needs in 2 out of 5 opportunities per pt and family report.  Baseline:  Goal status: INITIAL: progress  2.  With Maximal A, patient/family will demonstrate understanding of the following concepts: aphasia, spontaneous recovery, communication vs conversation, strengths/strategies to promote success, local resources by answering multiple choice questions with 50% accuracy when provided supported  conversation in order to increase patient and caregiver's participation in medical care.  Baseline:  Goal status: INITIAL: progress   ASSESSMENT:  CLINICAL IMPRESSION: Patient is a 73 y.o. right handed male who was seen today for a speech language treatment d/t left MCA CVA.   Pt presents with the appearance of global aphasia - potentially Wernicke's Aphasia. See below for details.  Given the severity of pt's aphasia, he is not able to communicate any basic wants/needs which (per his family's report) results in increased pt frustration and agitation.   Verbal Communication Pt's verbal communication was fluent but nonsensical speech consisting of normal rate and rhythm but didn't convey meaning. Pt was not aware of unintelligible nature of speech with few rote utterances that were intelligible. As such, pt was not able to name any basic common objects or produce rote utterances such as DOW, MOY (with written cues) or sing Happy Birthday. No ability to repeat basic words observed.   Auditory Comprehension Pt with moderately impaired auditory comprehension - given obejct name pt was able to select appropriate object in field of 2 in 4 out of 6 opportunities decreasing to 3 out of 8 opportunities with a field of 3, answered basic yes/no questions related to himself correctly in 4 out of 6 opportunities and followed basic 1-step directions related to self in 2 out of 4 opportunities.   Written Language During an unsuccessful attempt at verbal communication, he took this writer's pencil and attempted to write x 1 but was not able to write more than two letters ET but was able to write his first name.   Reading Comprehension Pt was able to pair written word with correct object in field of 3 with 100% accuracy (15 out of 15 opportunities).   Pt continues to participate and also demonstrates increased number of rote off the cuff responses that are intelligible. Continued decreased awareness of  verbal errors observed. Receptive to attempts to copy written words - intermittent difficulty but good self-monitoring observed.  See the above treatment note for details.   OBJECTIVE IMPAIRMENTS include expressive language, receptive language, aphasia, apraxia, and written language. These impairments are limiting patient from managing medications, managing appointments, managing finances, household responsibilities, ADLs/IADLs, and effectively communicating at home and in community. Factors affecting potential to achieve goals and functional outcome are severity of impairments, family/community support, and significantly reduced health literacy. Patient will benefit from skilled SLP services to address above impairments and improve overall function.  REHAB POTENTIAL: Good  PLAN: SLP FREQUENCY: 1-2x/week  SLP DURATION: 12 weeks  PLANNED INTERVENTIONS: Language facilitation, Cueing hierachy, Internal/external aids, Functional tasks, Multimodal communication approach, SLP instruction and feedback, Compensatory strategies, and Patient/family education    Traniya Prichett B. Rubbie, M.S., CCC-SLP, Tree surgeon Certified Brain Injury Specialist [facility]  [facility] Rehabilitation Services Office [phone] Ascom [phone] Fax [phone]

## 2023-10-16 ENCOUNTER — Encounter: Admitting: Speech Pathology

## 2023-10-16 ENCOUNTER — Ambulatory Visit: Admitting: Physical Therapy

## 2023-10-18 ENCOUNTER — Ambulatory Visit: Admitting: Speech Pathology

## 2023-10-18 ENCOUNTER — Ambulatory Visit: Admitting: Physical Therapy

## 2023-10-18 ENCOUNTER — Encounter

## 2023-10-18 DIAGNOSIS — R4701 Aphasia: Secondary | ICD-10-CM | POA: Diagnosis not present

## 2023-10-18 NOTE — Therapy (Signed)
 OUTPATIENT SPEECH LANGUAGE PATHOLOGY  SPEECH LANGUAGE TREATMENT NOTE   Patient Name: Gregory Lane MRN: [id number] DOB:Mar 17, 1950, 73 y.o., male 64 Date: 10/18/2023  PCP: Denyse DELENA Bathe, MD REFERRING PROVIDER: Arland Earnie Pouch, MD    End of Session - 10/18/23 [phone]     Visit Number 12    Number of Visits 25    Date for SLP Re-Evaluation 11/20/23    Authorization Type [employer] [place]    Progress Note Due on Visit 20    SLP Start Time 0930    SLP Stop Time  1015    SLP Time Calculation (min) 45 min    Activity Tolerance Patient tolerated treatment well          Past Medical History:  Diagnosis Date   Anxiety    Aortic atherosclerosis (HCC) 02/16/2017   Arthritis    Blind left eye    COPD (chronic obstructive pulmonary disease) (HCC)    Depression    Dysrhythmia    A-fib   GERD (gastroesophageal reflux disease)    Hypertension    Low TSH level 02/17/2017   Myocardial infarction [facility])    PAD (peripheral artery disease) (HCC)    Paroxysmal atrial fibrillation (HCC)    Perforated duodenal ulcer (HCC)    Stroke (HCC)    left side is weak   Past Surgical History:  Procedure Laterality Date   BRONCHIAL BIOPSY  03/22/2022   Procedure: BRONCHIAL BIOPSIES;  Surgeon: Isadora Hose, MD;  Location: [facility];  Service: Pulmonary;;   BRONCHIAL NEEDLE ASPIRATION BIOPSY  03/22/2022   Procedure: BRONCHIAL NEEDLE ASPIRATION BIOPSIES;  Surgeon: Isadora Hose, MD;  Location: [facility];  Service: Pulmonary;;   ENDOBRONCHIAL ULTRASOUND  03/22/2022   Procedure: ENDOBRONCHIAL ULTRASOUND;  Surgeon: Isadora Hose, MD;  Location: [facility];  Service: Pulmonary;;   EXPLORATORY LAPAROTOMY  05/12/2015   EYE SURGERY     FRACTURE SURGERY     LAPAROTOMY N/A 05/10/2015   Procedure: EXPLORATORY LAPAROTOMY;  Surgeon: Lynda Leos, MD;  Location: [facility];  Service: General;  Laterality: N/A;   LEFT HEART CATH AND CORONARY ANGIOGRAPHY N/A 05/02/2016   Procedure: Left  Heart Cath and Coronary Angiography;  Surgeon: Gordy Bergamo, MD;  Location: [facility] INVASIVE CV LAB;  Service: Cardiovascular;  Laterality: N/A;   LOWER EXTREMITY ANGIOGRAPHY Left 09/21/2021   Procedure: Lower Extremity Angiography;  Surgeon: Jama Cordella KANDICE, MD;  Location: [facility] INVASIVE CV LAB;  Service: Cardiovascular;  Laterality: Left;   Patient Active Problem List   Diagnosis Date Noted   Allergic rhinitis due to pollen 12/11/2022   Tinea cruris 12/11/2022   BPH associated with nocturia 12/11/2022   Chest pain, non-cardiac 04/11/2022   Adenocarcinoma of lower lobe of right lung (HCC) 03/28/2022   Pulmonary nodule 1 cm or greater in diameter 03/22/2022   Leg weakness, bilateral 02/09/2022   Osteopenia 10/13/2021   PAD (peripheral artery disease) (HCC) 09/21/2021   GERD without esophagitis 09/21/2021   Dyslipidemia 09/21/2021   Depression 09/21/2021   Atrial flutter (HCC) 09/21/2021   Chronic obstructive pulmonary disease (COPD) (HCC) 09/21/2021   Coronary artery disease 09/21/2021   Cerebrovascular accident (CVA) due to embolism of cerebral artery (HCC) 09/06/2021   Bilateral edema of lower extremity 08/05/2020   Vasovagal syncope 08/02/2020   Annual physical exam 12/26/2019   Hyperlipidemia 12/26/2019   Abrasion of right hand 12/26/2019   Metabolic syndrome 11/11/2019   Low TSH level 02/17/2017   Atrial flutter with rapid ventricular response (HCC) 02/16/2017  Cigarette smoker 02/16/2017   COPD with acute exacerbation (HCC) 02/16/2017   Elevated lactic acid level 02/16/2017   Aortic atherosclerosis (HCC) 02/16/2017   Productive cough    Hypoxia 05/24/2015   SOB (shortness of breath) 05/24/2015   COPD suggested by initial evaluation (HCC) 05/17/2015   Essential hypertension 05/17/2015   Smoking 05/17/2015    ONSET DATE: 08/09/2023   REFERRING DIAG: I63.9 (ICD-10-CM) - Cerebral infarction, unspecified   THERAPY DIAG:  Aphasia  Rationale for Evaluation and Treatment  Rehabilitation  SUBJECTIVE:   PERTINENT HISTORY and DIAGNOSTIC FINDINGS: Pt is a 73 year old right handed male who presented to [facility] ED on 08/09/2023 with stroke-like symptoms - slurred speech and difficulty answering questions and weakness to his right side extremities. CT head and subsequent CTA head and neck shows left M2 occlusion with large penumbra in the region. Pt transferred to [facility] for mechanical thrombectomy on 08/10/2023. Pt admitted at [facility] from 08/10/2023 thru 08/21/2023 with recommended for home health.   MRI brain: Remonstrated left MCA territory infarct primarily involving the parietal lobe.   Additional medical history includes: blindness left eye, A-fib on warfarin, COPD, HFpEF, HTN, current smoker, ethanol abuse, RLL adenocarcinoma s/p SBRT (2024)     PAIN:  Are you having pain? Unable to communicate  FALLS: Has patient fallen in last 6 months?  No  LIVING ENVIRONMENT: Lives with: sister-in-law Lives in: House/apartment  PLOF:  Level of assistance: Independent with ADLs, Independent with IADLs, Comment: poor health literacy, difficulty with reading and writing per family report Employment: Retired   PATIENT GOALS    per pt's sister-in-law, she hopes he will talk again  SUBJECTIVE STATEMENT: Pt's sister-in-law reports that pt doesn't appear to feel well today to which pt commented I have a bad cold this morning Pt accompanied by: sister-in-law  OBJECTIVE:   TODAY'S TREATMENT:  Skilled ST treatment session targeted pt's cognitive communication, aphasia and apraxia of speech goals. SLP facilitated session by providing the following interventions:  Subjectively, pt demonstrated improved receptive language comprehension throughout today's session. His comments indicated understanding of verbal information x 5 throughout the session - much improved  Treatment of Wernicke's Aphasia Therapy Approach was utilized with 20 basic objects (pictures) specific to patient  (comb, Pepsi, [employer]) - pt able to select from a field of 6 when given the verbal word with 100% - pt able to match word to picture in field of 6 with 100% accuracy - improved speech approximation observed throughout the activity thru use of repetition  Despite improved number of islands of intelligible speech, pt's speech continues to contain neologisms.   SLP further facilitated session by having pt pick written word from field of 3 based on reading comprehension of phrase describing object function - pt 98% accurate with reading comprehension, 75% accurate when copying the word from written cue  PATIENT EDUCATION: Education details: see above Person educated: Patient and his sister-in-law Education method: Explanation Education comprehension: needs further education  HOME EXERCISE PROGRAM:   Supplement verbal information with single written words  GOALS:  Goals reviewed with patient? Yes  SHORT TERM GOALS: Target date: 10 sessions  Updated: 10/10/2023 With maximal assistance, pt will select written word to express request for food/drink item in 7 out of 10 opportunities.  Baseline: Goal status: INITIAL: MET  2.  With maximal multimodal assistance, pt will answer yes/no questions in 8 out of 10 opportunities.  Baseline:  Goal status: INITIAL: Is able to answer using written language when  choosing between written yes/no  3.  With maximal multimodal assistance, pt will verbally approximate common object names in 5 out of 10 opportunities.  Baseline:  Goal status: INITIAL: progress made   LONG TERM GOALS: Target date: 11/20/2023  Updated 10/10/2023 Pt will use multimodal communication and maximal assistance to communicate basic wants and needs in 2 out of 5 opportunities per pt and family report.  Baseline:  Goal status: INITIAL: progress  2.  With Maximal A, patient/family will demonstrate understanding of the following concepts: aphasia, spontaneous recovery,  communication vs conversation, strengths/strategies to promote success, local resources by answering multiple choice questions with 50% accuracy when provided supported conversation in order to increase patient and caregiver's participation in medical care.  Baseline:  Goal status: INITIAL: progress   ASSESSMENT:  CLINICAL IMPRESSION: Patient is a 73 y.o. right handed male who was seen today for a speech language treatment d/t left MCA CVA.   Pt presents with the appearance of global aphasia - potentially Wernicke's Aphasia. See below for details.  Given the severity of pt's aphasia, he is not able to communicate any basic wants/needs which (per his family's report) results in increased pt frustration and agitation.   Verbal Communication Pt's verbal communication was fluent but nonsensical speech consisting of normal rate and rhythm but didn't convey meaning. Pt was not aware of unintelligible nature of speech with few rote utterances that were intelligible. As such, pt was not able to name any basic common objects or produce rote utterances such as DOW, MOY (with written cues) or sing Happy Birthday. No ability to repeat basic words observed.   Auditory Comprehension Pt with moderately impaired auditory comprehension - given obejct name pt was able to select appropriate object in field of 2 in 4 out of 6 opportunities decreasing to 3 out of 8 opportunities with a field of 3, answered basic yes/no questions related to himself correctly in 4 out of 6 opportunities and followed basic 1-step directions related to self in 2 out of 4 opportunities.   Written Language During an unsuccessful attempt at verbal communication, he took this writer's pencil and attempted to write x 1 but was not able to write more than two letters ET but was able to write his first name.   Reading Comprehension Pt was able to pair written word with correct object in field of 3 with 100% accuracy (15 out of 15  opportunities).   Pt continues to participate and also demonstrates increased number of rote off the cuff responses that are intelligible. Receptive to attempts to copy written words - intermittent difficulty but good self-monitoring observed.  See the above treatment note for details.   OBJECTIVE IMPAIRMENTS include expressive language, receptive language, aphasia, apraxia, and written language. These impairments are limiting patient from managing medications, managing appointments, managing finances, household responsibilities, ADLs/IADLs, and effectively communicating at home and in community. Factors affecting potential to achieve goals and functional outcome are severity of impairments, family/community support, and significantly reduced health literacy. Patient will benefit from skilled SLP services to address above impairments and improve overall function.  REHAB POTENTIAL: Good  PLAN: SLP FREQUENCY: 1-2x/week  SLP DURATION: 12 weeks  PLANNED INTERVENTIONS: Language facilitation, Cueing hierachy, Internal/external aids, Functional tasks, Multimodal communication approach, SLP instruction and feedback, Compensatory strategies, and Patient/family education    Happi B. Rubbie, M.S., CCC-SLP, Tree surgeon Certified Brain Injury Specialist [facility]  [facility] Rehabilitation Services Office [phone] Ascom [phone] Fax [phone]

## 2023-10-23 ENCOUNTER — Ambulatory Visit: Attending: Internal Medicine | Admitting: Speech Pathology

## 2023-10-23 ENCOUNTER — Ambulatory Visit

## 2023-10-23 DIAGNOSIS — R482 Apraxia: Secondary | ICD-10-CM | POA: Insufficient documentation

## 2023-10-23 DIAGNOSIS — R4701 Aphasia: Secondary | ICD-10-CM | POA: Diagnosis present

## 2023-10-23 NOTE — Therapy (Signed)
 OUTPATIENT SPEECH LANGUAGE PATHOLOGY  SPEECH LANGUAGE TREATMENT NOTE   Patient Name: Gregory Lane MRN: [id number] DOB:01-28-1951, 73 y.o., male 88 Date: 10/23/2023  PCP: Denyse DELENA Bathe, MD REFERRING PROVIDER: Arland Earnie Pouch, MD    End of Session - 10/23/23 1052     Visit Number 13    Number of Visits 25    Date for SLP Re-Evaluation 11/20/23    Authorization Type [employer] [place]    Progress Note Due on Visit 20    SLP Start Time 1020    SLP Stop Time  1050    SLP Time Calculation (min) 30 min    Activity Tolerance Patient tolerated treatment well          Past Medical History:  Diagnosis Date   Anxiety    Aortic atherosclerosis (HCC) 02/16/2017   Arthritis    Blind left eye    COPD (chronic obstructive pulmonary disease) (HCC)    Depression    Dysrhythmia    A-fib   GERD (gastroesophageal reflux disease)    Hypertension    Low TSH level 02/17/2017   Myocardial infarction [facility])    PAD (peripheral artery disease) (HCC)    Paroxysmal atrial fibrillation (HCC)    Perforated duodenal ulcer (HCC)    Stroke (HCC)    left side is weak   Past Surgical History:  Procedure Laterality Date   BRONCHIAL BIOPSY  03/22/2022   Procedure: BRONCHIAL BIOPSIES;  Surgeon: Isadora Hose, MD;  Location: [facility];  Service: Pulmonary;;   BRONCHIAL NEEDLE ASPIRATION BIOPSY  03/22/2022   Procedure: BRONCHIAL NEEDLE ASPIRATION BIOPSIES;  Surgeon: Isadora Hose, MD;  Location: [facility];  Service: Pulmonary;;   ENDOBRONCHIAL ULTRASOUND  03/22/2022   Procedure: ENDOBRONCHIAL ULTRASOUND;  Surgeon: Isadora Hose, MD;  [facility]: [facility];  Service: Pulmonary;;   EXPLORATORY LAPAROTOMY  05/12/2015   EYE SURGERY     FRACTURE SURGERY     LAPAROTOMY N/A 05/10/2015   Procedure: EXPLORATORY LAPAROTOMY;  Surgeon: Lynda Leos, MD;  [facility]: [facility];  Service: General;  Laterality: N/A;   LEFT HEART CATH AND CORONARY ANGIOGRAPHY N/A 05/02/2016   Procedure: Left Heart  Cath and Coronary Angiography;  Surgeon: Gordy Bergamo, MD;  Location: [facility] INVASIVE CV LAB;  Service: Cardiovascular;  Laterality: N/A;   LOWER EXTREMITY ANGIOGRAPHY Left 09/21/2021   Procedure: Lower Extremity Angiography;  Surgeon: Jama Cordella KANDICE, MD;  Location: [facility] INVASIVE CV LAB;  Service: Cardiovascular;  Laterality: Left;   Patient Active Problem List   Diagnosis Date Noted   Allergic rhinitis due to pollen 12/11/2022   Tinea cruris 12/11/2022   BPH associated with nocturia 12/11/2022   Chest pain, non-cardiac 04/11/2022   Adenocarcinoma of lower lobe of right lung (HCC) 03/28/2022   Pulmonary nodule 1 cm or greater in diameter 03/22/2022   Leg weakness, bilateral 02/09/2022   Osteopenia 10/13/2021   PAD (peripheral artery disease) (HCC) 09/21/2021   GERD without esophagitis 09/21/2021   Dyslipidemia 09/21/2021   Depression 09/21/2021   Atrial flutter (HCC) 09/21/2021   Chronic obstructive pulmonary disease (COPD) (HCC) 09/21/2021   Coronary artery disease 09/21/2021   Cerebrovascular accident (CVA) due to embolism of cerebral artery (HCC) 09/06/2021   Bilateral edema of lower extremity 08/05/2020   Vasovagal syncope 08/02/2020   Annual physical exam 12/26/2019   Hyperlipidemia 12/26/2019   Abrasion of right hand 12/26/2019   Metabolic syndrome 11/11/2019   Low TSH level 02/17/2017   Atrial flutter with rapid ventricular response (HCC) 02/16/2017  Cigarette smoker 02/16/2017   COPD with acute exacerbation (HCC) 02/16/2017   Elevated lactic acid level 02/16/2017   Aortic atherosclerosis (HCC) 02/16/2017   Productive cough    Hypoxia 05/24/2015   SOB (shortness of breath) 05/24/2015   COPD suggested by initial evaluation (HCC) 05/17/2015   Essential hypertension 05/17/2015   Smoking 05/17/2015    ONSET DATE: 08/09/2023   REFERRING DIAG: I63.9 (ICD-10-CM) - Cerebral infarction, unspecified   THERAPY DIAG:  Aphasia  Rationale for Evaluation and Treatment  Rehabilitation  SUBJECTIVE:   PERTINENT HISTORY and DIAGNOSTIC FINDINGS: Pt is a 73 year old right handed male who presented to [facility] ED on 08/09/2023 with stroke-like symptoms - slurred speech and difficulty answering questions and weakness to his right side extremities. CT head and subsequent CTA head and neck shows left M2 occlusion with large penumbra in the region. Pt transferred to [facility] for mechanical thrombectomy on 08/10/2023. Pt admitted at [facility] from 08/10/2023 thru 08/21/2023 with recommended for home health.   MRI brain: Remonstrated left MCA territory infarct primarily involving the parietal lobe.   Additional medical history includes: blindness left eye, A-fib on warfarin, COPD, HFpEF, HTN, current smoker, ethanol abuse, RLL adenocarcinoma s/p SBRT (2024)     PAIN:  Are you having pain? Unable to communicate  FALLS: Has patient fallen in last 6 months?  No  LIVING ENVIRONMENT: Lives with: sister-in-law Lives in: House/apartment  PLOF:  Level of assistance: Independent with ADLs, Independent with IADLs, Comment: poor health literacy, difficulty with reading and writing per family report Employment: Retired   PATIENT GOALS    per pt's sister-in-law, she hopes he will talk again  SUBJECTIVE STATEMENT: Pt attended session with sister-in-law. Pt accompanied by: sister-in-law  OBJECTIVE:   TODAY'S TREATMENT:  Skilled ST treatment session targeted pt's cognitive communication, aphasia and apraxia of speech goals. SLP facilitated session by providing the following interventions:  Subjectively, pt demonstrated improved receptive language comprehension throughout today's session. His comments indicated understanding of verbal information throughout the session - much improved - pt and his sister report eagerness to complete worksheets provided and he eagerly received today's packet.   To increase comprehension skills, personal items (Pepsi, crocs, comb) and common objects  (plate, screwdriver, flashlight) paired with their words were presented in the following:  SLP verbally presented each picture and word card to the pt. Pt then said each word with the clinician. The last round consisted of pt saying word by self. SLP facilitated the session further by providing the word card only.  Personal items: when verbalized with SLP: 7/10; when verbalized by self: 5/9; when verbalized with written word only: 7/10 Basic common items: when verbalized with SLP: 6/13; when verbalized by self: 7/13; when verbalized with written word only: 8/13  Despite improved number of islands of intelligible speech, pt's speech continues to contain neologisms.   PATIENT EDUCATION: Education details: see above Person educated: Patient and his sister-in-law Education method: Explanation Education comprehension: needs further education  HOME EXERCISE PROGRAM:   Supplement verbal information with single written words  GOALS:  Goals reviewed with patient? Yes  SHORT TERM GOALS: Target date: 10 sessions  Updated: 10/10/2023 With maximal assistance, pt will select written word to express request for food/drink item in 7 out of 10 opportunities.  Baseline: Goal status: INITIAL: MET  2.  With maximal multimodal assistance, pt will answer yes/no questions in 8 out of 10 opportunities.  Baseline:  Goal status: INITIAL: Is able to answer using written language when  choosing between written yes/no  3.  With maximal multimodal assistance, pt will verbally approximate common object names in 5 out of 10 opportunities.  Baseline:  Goal status: INITIAL: progress made   LONG TERM GOALS: Target date: 11/20/2023  Updated 10/10/2023 Pt will use multimodal communication and maximal assistance to communicate basic wants and needs in 2 out of 5 opportunities per pt and family report.  Baseline:  Goal status: INITIAL: progress  2.  With Maximal A, patient/family will demonstrate understanding of  the following concepts: aphasia, spontaneous recovery, communication vs conversation, strengths/strategies to promote success, local resources by answering multiple choice questions with 50% accuracy when provided supported conversation in order to increase patient and caregiver's participation in medical care.  Baseline:  Goal status: INITIAL: progress   ASSESSMENT:  CLINICAL IMPRESSION: Patient is a 73 y.o. right handed male who was seen today for a speech language treatment d/t left MCA CVA.   Pt presents with the appearance of global aphasia - potentially Wernicke's Aphasia. See below for details.  Given the severity of pt's aphasia, he is not able to communicate any basic wants/needs which (per his family's report) results in increased pt frustration and agitation.   Verbal Communication Pt's verbal communication was fluent but nonsensical speech consisting of normal rate and rhythm but didn't convey meaning. Pt was not aware of unintelligible nature of speech with few rote utterances that were intelligible. As such, pt was not able to name any basic common objects or produce rote utterances such as DOW, MOY (with written cues) or sing Happy Birthday. No ability to repeat basic words observed.   Auditory Comprehension Pt with moderately impaired auditory comprehension - given obejct name pt was able to select appropriate object in field of 2 in 4 out of 6 opportunities decreasing to 3 out of 8 opportunities with a field of 3, answered basic yes/no questions related to himself correctly in 4 out of 6 opportunities and followed basic 1-step directions related to self in 2 out of 4 opportunities.   Written Language During an unsuccessful attempt at verbal communication, he took this writer's pencil and attempted to write x 1 but was not able to write more than two letters ET but was able to write his first name.   Reading Comprehension Pt was able to pair written word with correct object  in field of 3 with 100% accuracy (15 out of 15 opportunities).   Pt continues to participate and also demonstrates increased number of rote off the cuff responses that are intelligible. Receptive to attempts to copy written words - intermittent difficulty but good self-monitoring observed.  See the above treatment note for details.   OBJECTIVE IMPAIRMENTS include expressive language, receptive language, aphasia, apraxia, and written language. These impairments are limiting patient from managing medications, managing appointments, managing finances, household responsibilities, ADLs/IADLs, and effectively communicating at home and in community. Factors affecting potential to achieve goals and functional outcome are severity of impairments, family/community support, and significantly reduced health literacy. Patient will benefit from skilled SLP services to address above impairments and improve overall function.  REHAB POTENTIAL: Good  PLAN: SLP FREQUENCY: 1-2x/week  SLP DURATION: 12 weeks  PLANNED INTERVENTIONS: Language facilitation, Cueing hierachy, Internal/external aids, Functional tasks, Multimodal communication approach, SLP instruction and feedback, Compensatory strategies, and Patient/family education    Happi B. Rubbie, M.S., CCC-SLP, Tree surgeon Certified Brain Injury Specialist [facility]  [facility] Rehabilitation Services Office [phone] Ascom [phone] Fax [phone]

## 2023-10-25 ENCOUNTER — Encounter

## 2023-10-25 ENCOUNTER — Ambulatory Visit: Admitting: Physical Therapy

## 2023-10-25 ENCOUNTER — Encounter: Admitting: Occupational Therapy

## 2023-10-25 ENCOUNTER — Ambulatory Visit: Admitting: Speech Pathology

## 2023-10-25 DIAGNOSIS — R4701 Aphasia: Secondary | ICD-10-CM | POA: Diagnosis not present

## 2023-10-25 DIAGNOSIS — R482 Apraxia: Secondary | ICD-10-CM

## 2023-10-25 NOTE — Therapy (Signed)
 OUTPATIENT SPEECH LANGUAGE PATHOLOGY  SPEECH LANGUAGE TREATMENT NOTE   Patient Name: Gregory Lane MRN: [id number] DOB:1950-08-28, 73 y.o., male 15 Date: 10/25/2023  PCP: Denyse DELENA Bathe, MD REFERRING PROVIDER: Arland Earnie Pouch, MD    End of Session - 10/25/23 1011     Visit Number 14    Number of Visits 25    Date for SLP Re-Evaluation 11/20/23    Authorization Type [employer] [place]    Progress Note Due on Visit 20    SLP Start Time 1015    SLP Stop Time  1100    SLP Time Calculation (min) 45 min    Activity Tolerance Patient tolerated treatment well          Past Medical History:  Diagnosis Date   Anxiety    Aortic atherosclerosis (HCC) 02/16/2017   Arthritis    Blind left eye    COPD (chronic obstructive pulmonary disease) (HCC)    Depression    Dysrhythmia    A-fib   GERD (gastroesophageal reflux disease)    Hypertension    Low TSH level 02/17/2017   Myocardial infarction [facility])    PAD (peripheral artery disease) (HCC)    Paroxysmal atrial fibrillation (HCC)    Perforated duodenal ulcer (HCC)    Stroke (HCC)    left side is weak   Past Surgical History:  Procedure Laterality Date   BRONCHIAL BIOPSY  03/22/2022   Procedure: BRONCHIAL BIOPSIES;  Surgeon: Isadora Hose, MD;  [facility];  Service: Pulmonary;;   BRONCHIAL NEEDLE ASPIRATION BIOPSY  03/22/2022   Procedure: BRONCHIAL NEEDLE ASPIRATION BIOPSIES;  Surgeon: Isadora Hose, MD;  [facility];  Service: Pulmonary;;   ENDOBRONCHIAL ULTRASOUND  03/22/2022   Procedure: ENDOBRONCHIAL ULTRASOUND;  Surgeon: Isadora Hose, MD;  [facility]: [facility];  Service: Pulmonary;;   EXPLORATORY LAPAROTOMY  05/12/2015   EYE SURGERY     FRACTURE SURGERY     LAPAROTOMY N/A 05/10/2015   Procedure: EXPLORATORY LAPAROTOMY;  Surgeon: Lynda Leos, MD;  Location: [facility] OR;  Service: General;  Laterality: N/A;   LEFT HEART CATH AND CORONARY ANGIOGRAPHY N/A 05/02/2016   Procedure: Left Heart  Cath and Coronary Angiography;  Surgeon: Gordy Bergamo, MD;  Location: [facility] INVASIVE CV LAB;  Service: Cardiovascular;  Laterality: N/A;   LOWER EXTREMITY ANGIOGRAPHY Left 09/21/2021   Procedure: Lower Extremity Angiography;  Surgeon: Jama Cordella KANDICE, MD;  Location: [facility] INVASIVE CV LAB;  Service: Cardiovascular;  Laterality: Left;   Patient Active Problem List   Diagnosis Date Noted   Allergic rhinitis due to pollen 12/11/2022   Tinea cruris 12/11/2022   BPH associated with nocturia 12/11/2022   Chest pain, non-cardiac 04/11/2022   Adenocarcinoma of lower lobe of right lung (HCC) 03/28/2022   Pulmonary nodule 1 cm or greater in diameter 03/22/2022   Leg weakness, bilateral 02/09/2022   Osteopenia 10/13/2021   PAD (peripheral artery disease) (HCC) 09/21/2021   GERD without esophagitis 09/21/2021   Dyslipidemia 09/21/2021   Depression 09/21/2021   Atrial flutter (HCC) 09/21/2021   Chronic obstructive pulmonary disease (COPD) (HCC) 09/21/2021   Coronary artery disease 09/21/2021   Cerebrovascular accident (CVA) due to embolism of cerebral artery (HCC) 09/06/2021   Bilateral edema of lower extremity 08/05/2020   Vasovagal syncope 08/02/2020   Annual physical exam 12/26/2019   Hyperlipidemia 12/26/2019   Abrasion of right hand 12/26/2019   Metabolic syndrome 11/11/2019   Low TSH level 02/17/2017   Atrial flutter with rapid ventricular response (HCC) 02/16/2017  Cigarette smoker 02/16/2017   COPD with acute exacerbation (HCC) 02/16/2017   Elevated lactic acid level 02/16/2017   Aortic atherosclerosis (HCC) 02/16/2017   Productive cough    Hypoxia 05/24/2015   SOB (shortness of breath) 05/24/2015   COPD suggested by initial evaluation (HCC) 05/17/2015   Essential hypertension 05/17/2015   Smoking 05/17/2015    ONSET DATE: 08/09/2023   REFERRING DIAG: I63.9 (ICD-10-CM) - Cerebral infarction, unspecified   THERAPY DIAG:  Aphasia  Apraxia  Rationale for Evaluation and Treatment  Rehabilitation  SUBJECTIVE:   PERTINENT HISTORY and DIAGNOSTIC FINDINGS: Pt is a 73 year old right handed male who presented to [facility] ED on 08/09/2023 with stroke-like symptoms - slurred speech and difficulty answering questions and weakness to his right side extremities. CT head and subsequent CTA head and neck shows left M2 occlusion with large penumbra in the region. Pt transferred to [facility] for mechanical thrombectomy on 08/10/2023. Pt admitted at [facility] from 08/10/2023 thru 08/21/2023 with recommended for home health.   MRI brain: Remonstrated left MCA territory infarct primarily involving the parietal lobe.   Additional medical history includes: blindness left eye, A-fib on warfarin, COPD, HFpEF, HTN, current smoker, ethanol abuse, RLL adenocarcinoma s/p SBRT (2024)     PAIN:  Are you having pain? Unable to communicate  FALLS: Has patient fallen in last 6 months?  No  LIVING ENVIRONMENT: Lives with: sister-in-law Lives in: House/apartment  PLOF:  Level of assistance: Independent with ADLs, Independent with IADLs, Comment: poor health literacy, difficulty with reading and writing per family report Employment: Retired   PATIENT GOALS    per pt's sister-in-law, she hopes he will talk again  SUBJECTIVE STATEMENT: Pt attended session with sister-in-law. He brought in his homework and handed it to therapist.  Pt accompanied by: sister-in-law  OBJECTIVE:   TODAY'S TREATMENT:  Skilled ST treatment session targeted pt's cognitive communication, aphasia and apraxia of speech goals. SLP facilitated session by providing the following interventions:  Pt continues to express more intelligible comments, specifically he asked this Clinical research associate a question today how are you?   To increase comprehension skills, pictures of personal items (Pepsi, crocs, comb) and days of the week paired with their words were presented in the following:  SLP verbally presented each picture and written word. Pt then  said each word with the clinician. The last round consisted of pt saying word by self.  Personal items: when verbalized with SLP: 9/11; when verbalized by self: 6/11 Days of the week: pt benefited from cue make it sound the same - with cue pt's speech intelligibility improved with this cue to 5 out 7 - as pt attempted more repetitions, he presented with perseverative jargon. Despite this, he demonstrated increased awareness of verbal errors when listening to audio recordings - he mentioned I know you say it one way, I say it the other way  TalkPath Therapy App utilized to target auditory comprehension at the word level Word ID: Level 1: 15/19 with mod A semantic cue: 2nd attempt: 11/17 with max A via semantic cues.   Pt's sister-in-law continues to reference difficulty with having a conversation, trying to figure out what he is saying - this Clinical research associate recommends that she supplement verbal communication by writing down key words to help pt's understanding. Examples provided.   PATIENT EDUCATION: Education details: see above Person educated: Patient and his sister-in-law Education method: Explanation Education comprehension: needs further education  HOME EXERCISE PROGRAM:   Supplement verbal information with single written words  GOALS:  Goals reviewed with patient? Yes  SHORT TERM GOALS: Target date: 10 sessions  Updated: 10/10/2023 With maximal assistance, pt will select written word to express request for food/drink item in 7 out of 10 opportunities.  Baseline: Goal status: INITIAL: MET  2.  With maximal multimodal assistance, pt will answer yes/no questions in 8 out of 10 opportunities.  Baseline:  Goal status: INITIAL: Is able to answer using written language when choosing between written yes/no  3.  With maximal multimodal assistance, pt will verbally approximate common object names in 5 out of 10 opportunities.  Baseline:  Goal status: INITIAL: progress made   LONG TERM  GOALS: Target date: 11/20/2023  Updated 10/10/2023 Pt will use multimodal communication and maximal assistance to communicate basic wants and needs in 2 out of 5 opportunities per pt and family report.  Baseline:  Goal status: INITIAL: progress  2.  With Maximal A, patient/family will demonstrate understanding of the following concepts: aphasia, spontaneous recovery, communication vs conversation, strengths/strategies to promote success, local resources by answering multiple choice questions with 50% accuracy when provided supported conversation in order to increase patient and caregiver's participation in medical care.  Baseline:  Goal status: INITIAL: progress   ASSESSMENT:  CLINICAL IMPRESSION: Patient is a 73 y.o. right handed male who was seen today for a speech language treatment d/t left MCA CVA.   Pt presents with the appearance of global aphasia - potentially Wernicke's Aphasia. See below for details.  Given the severity of pt's aphasia, he is not able to communicate any basic wants/needs which (per his family's report) results in increased pt frustration and agitation.   Verbal Communication Pt's verbal communication was fluent but nonsensical speech consisting of normal rate and rhythm but didn't convey meaning. Pt was not aware of unintelligible nature of speech with few rote utterances that were intelligible. As such, pt was not able to name any basic common objects or produce rote utterances such as DOW, MOY (with written cues) or sing Happy Birthday. No ability to repeat basic words observed.   Auditory Comprehension Pt with moderately impaired auditory comprehension - given obejct name pt was able to select appropriate object in field of 2 in 4 out of 6 opportunities decreasing to 3 out of 8 opportunities with a field of 3, answered basic yes/no questions related to himself correctly in 4 out of 6 opportunities and followed basic 1-step directions related to self in 2 out of 4  opportunities.   Written Language During an unsuccessful attempt at verbal communication, he took this writer's pencil and attempted to write x 1 but was not able to write more than two letters ET but was able to write his first name.   Reading Comprehension Pt was able to pair written word with correct object in field of 3 with 100% accuracy (15 out of 15 opportunities).   Pt continues to participate and also demonstrates increased number of rote off the cuff responses that are intelligible. Receptive to attempts to copy written words - intermittent difficulty but good self-monitoring observed.  See the above treatment note for details.   OBJECTIVE IMPAIRMENTS include expressive language, receptive language, aphasia, apraxia, and written language. These impairments are limiting patient from managing medications, managing appointments, managing finances, household responsibilities, ADLs/IADLs, and effectively communicating at home and in community. Factors affecting potential to achieve goals and functional outcome are severity of impairments, family/community support, and significantly reduced health literacy. Patient will benefit from skilled SLP services to address  above impairments and improve overall function.  REHAB POTENTIAL: Good  PLAN: SLP FREQUENCY: 1-2x/week  SLP DURATION: 12 weeks  PLANNED INTERVENTIONS: Language facilitation, Cueing hierachy, Internal/external aids, Functional tasks, Multimodal communication approach, SLP instruction and feedback, Compensatory strategies, and Patient/family education    Happi B. Rubbie, M.S., CCC-SLP, Tree surgeon Certified Brain Injury Specialist [facility]  [facility] Rehabilitation Services Office [phone] Ascom [phone] Fax [phone]

## 2023-10-29 ENCOUNTER — Other Ambulatory Visit: Payer: Self-pay

## 2023-10-29 ENCOUNTER — Encounter: Payer: Self-pay | Admitting: *Deleted

## 2023-10-29 ENCOUNTER — Ambulatory Visit (INDEPENDENT_AMBULATORY_CARE_PROVIDER_SITE_OTHER): Admitting: Internal Medicine

## 2023-10-29 ENCOUNTER — Encounter: Payer: Self-pay | Admitting: Internal Medicine

## 2023-10-29 ENCOUNTER — Telehealth: Payer: Self-pay | Admitting: Urology

## 2023-10-29 VITALS — BP 138/70 | HR 54 | Ht 73.0 in | Wt 182.2 lb

## 2023-10-29 DIAGNOSIS — R4701 Aphasia: Secondary | ICD-10-CM | POA: Diagnosis not present

## 2023-10-29 DIAGNOSIS — E782 Mixed hyperlipidemia: Secondary | ICD-10-CM | POA: Diagnosis not present

## 2023-10-29 DIAGNOSIS — I6340 Cerebral infarction due to embolism of unspecified cerebral artery: Secondary | ICD-10-CM | POA: Diagnosis not present

## 2023-10-29 DIAGNOSIS — J301 Allergic rhinitis due to pollen: Secondary | ICD-10-CM

## 2023-10-29 MED ORDER — APIXABAN 5 MG PO TABS: 5.0000 mg | ORAL_TABLET | Freq: Two times a day (BID) | ORAL | 3 refills | Status: DC

## 2023-10-29 MED ORDER — CETIRIZINE HCL 10 MG PO TABS: 10.0000 mg | ORAL_TABLET | Freq: Every day | ORAL | 1 refills | Status: DC

## 2023-10-29 MED ORDER — FLUOXETINE HCL 20 MG PO CAPS: 40.0000 mg | ORAL_CAPSULE | Freq: Every day | ORAL | 0 refills | Status: DC

## 2023-10-29 NOTE — Progress Notes (Signed)
 Established Patient Office Visit  Subjective:  Patient ID: Gregory Lane, male    DOB: 1950-03-03  Age: 73 y.o. MRN: [id number]  Chief Complaint  Patient presents with   Follow-up    2 month follow up    No new complaints, here for lab review and medication refills.Failed to have previsit labs done. Speech is improving although still dysphasic.     No other concerns at this time.   Past Medical History:  Diagnosis Date   Anxiety    Aortic atherosclerosis (HCC) 02/16/2017   Arthritis    Blind left eye    COPD (chronic obstructive pulmonary disease) (HCC)    Depression    Dysrhythmia    A-fib   GERD (gastroesophageal reflux disease)    Hypertension    Low TSH level 02/17/2017   Myocardial infarction [facility])    PAD (peripheral artery disease) (HCC)    Paroxysmal atrial fibrillation (HCC)    Perforated duodenal ulcer (HCC)    Stroke (HCC)    left side is weak    Past Surgical History:  Procedure Laterality Date   BRONCHIAL BIOPSY  03/22/2022   Procedure: BRONCHIAL BIOPSIES;  Surgeon: Isadora Hose, MD;  [facility]: [facility];  Service: Pulmonary;;   BRONCHIAL NEEDLE ASPIRATION BIOPSY  03/22/2022   Procedure: BRONCHIAL NEEDLE ASPIRATION BIOPSIES;  Surgeon: Isadora Hose, MD;  [facility]: [facility];  Service: Pulmonary;;   ENDOBRONCHIAL ULTRASOUND  03/22/2022   Procedure: ENDOBRONCHIAL ULTRASOUND;  Surgeon: Isadora Hose, MD;  [facility]: [facility];  Service: Pulmonary;;   EXPLORATORY LAPAROTOMY  05/12/2015   EYE SURGERY     FRACTURE SURGERY     LAPAROTOMY N/A 05/10/2015   Procedure: EXPLORATORY LAPAROTOMY;  Surgeon: Lynda Leos, MD;  Location: [facility] OR;  Service: General;  Laterality: N/A;   LEFT HEART CATH AND CORONARY ANGIOGRAPHY N/A 05/02/2016   Procedure: Left Heart Cath and Coronary Angiography;  Surgeon: Gordy Bergamo, MD;  Location: [facility] INVASIVE CV LAB;  Service: Cardiovascular;  Laterality: N/A;   LOWER EXTREMITY ANGIOGRAPHY Left 09/21/2021   Procedure: Lower  Extremity Angiography;  Surgeon: Jama Cordella KANDICE, MD;  Location: [facility] INVASIVE CV LAB;  Service: Cardiovascular;  Laterality: Left;    Social History   Socioeconomic History   Marital status: Single    Spouse name: Not on file   Number of children: Not on file   Years of education: Not on file   Highest education level: Not on file  Occupational History   Not on file  Tobacco Use   Smoking status: Every Day    Current packs/day: 0.50    Average packs/day: 0.5 packs/day for 65.0 years (32.5 ttl pk-yrs)    Types: Cigarettes   Smokeless tobacco: Never   Tobacco comments:    0.5 PPD  Vaping Use   Vaping status: Never Used  Substance and Sexual Activity   Alcohol use: Yes    Alcohol/week: 60.0 standard drinks of alcohol    Types: 60 Standard drinks or equivalent per week    Comment: daily 12 pk of beer   Drug use: No   Sexual activity: Not Currently  Other Topics Concern   Not on file  Social History Narrative   [place] Veteran. Lives with sister and sister-in-law   Social Drivers of Health   Financial Resource Strain: Low Risk  (08/10/2023)   Received [facility]   Overall Financial Resource Strain (CARDIA)    How hard is it for you to pay for the very basics  like food, housing, medical care, and heating?: Not very hard  Food Insecurity: No Food Insecurity (08/10/2023)   Received from [facility]   Hunger Vital Sign    Within the past 12 months, you worried that your food would run out before you got the money to buy more.: Never true    Within the past 12 months, the food you bought just didn't last and you didn't have money to get more.: Never true  Transportation Needs: No Transportation Needs (08/10/2023)   Received from [facility] - Transportation    Lack of Transportation (Medical): No    Lack of Transportation (Non-Medical): No  Physical Activity: Inactive (01/23/2022)   Exercise Vital Sign    Days of Exercise per Week: 0 days     Minutes of Exercise per Session: 0 min  Stress: No Stress Concern Present (09/09/2021)   [employer] of Occupational Health - Occupational Stress Questionnaire    Feeling of Stress : Only a little  Social Connections: Socially Isolated (09/09/2021)   Social Connection and Isolation Panel    Frequency of Communication with Friends and Family: More than three times a week    Frequency of Social Gatherings with Friends and Family: More than three times a week    Attends Religious Services: Never    Database administrator or Organizations: No    Attends Banker Meetings: Never    Marital Status: Never married  Intimate Partner Violence: Not At Risk (03/28/2022)   Humiliation, Afraid, Rape, and Kick questionnaire    Fear of Current or Ex-Partner: No    Emotionally Abused: No    Physically Abused: No    Sexually Abused: No    Family History  Problem Relation Age of Onset   Hypertension Mother    Dementia Mother    Cirrhosis Father    Cancer Father    Lung cancer Sister    Hypertension Sister    Arthritis Sister    Heart murmur Sister    Arrhythmia Sister     No Known Allergies  Outpatient Medications Prior to Visit  Medication Sig   apixaban  (ELIQUIS ) 5 MG TABS tablet Take 5 mg by mouth.   atorvastatin  (LIPITOR) 40 MG tablet TAKE 1 TABLET BY MOUTH EVERY DAY   fluticasone  (FLONASE ) 50 MCG/ACT nasal spray Place 1 spray into both nostrils daily. (Patient not taking: Reported on 10/01/2023)   furosemide  (LASIX ) 20 MG tablet Take 20 mg by mouth daily. (Patient not taking: Reported on 10/01/2023)   KLOR-CON  M20 20 MEQ tablet Take 20 mEq by mouth 2 (two) times daily. (Patient not taking: Reported on 10/01/2023)   losartan  (COZAAR ) 50 MG tablet Take 1 tablet (50 mg total) by mouth 2 (two) times daily.   pantoprazole  (PROTONIX ) 40 MG tablet Take 1 tablet (40 mg total) by mouth 2 (two) times daily.   [DISCONTINUED] cetirizine  (ZYRTEC  ALLERGY) 10 MG tablet Take 1 tablet (10 mg  total) by mouth daily. (Patient not taking: Reported on 10/01/2023)   [DISCONTINUED] clotrimazole -betamethasone  (LOTRISONE ) cream Apply 1 Application topically 2 (two) times daily. (Patient not taking: Reported on 10/01/2023)   [DISCONTINUED] FLUoxetine  (PROZAC ) 20 MG capsule Take 2 capsules (40 mg total) by mouth daily. (Patient not taking: Reported on 10/01/2023)   [DISCONTINUED] warfarin (COUMADIN ) 4 MG tablet Take 1 tablet (4 mg total) by mouth daily. Take 1 daily Monday  through Friday. Do not take Saturday and Sunday.Take 4 mg by mouth daily. (Patient  not taking: Reported on 10/01/2023)   No facility-administered medications prior to visit.    Review of Systems  Constitutional:  Positive for weight loss (3 lbs).  HENT: Negative.    Eyes: Negative.   Respiratory: Negative.    Cardiovascular: Negative.   Gastrointestinal: Negative.   Genitourinary: Negative.   Skin:  Negative for itching and rash.  Neurological: Negative.   Endo/Heme/Allergies: Negative.   Psychiatric/Behavioral:  The patient has insomnia.        Objective:   BP 138/70   Pulse (!) 54   Ht 6' 1 (1.854 m)   Wt 182 lb 3.2 oz (82.6 kg)   SpO2 98%   BMI 24.04 kg/m   Vitals:   10/29/23 1329  BP: 138/70  Pulse: (!) 54  Height: 6' 1 (1.854 m)  Weight: 182 lb 3.2 oz (82.6 kg)  SpO2: 98%  BMI (Calculated): 24.04    Physical Exam Vitals reviewed.  Constitutional:      Appearance: Normal appearance.  HENT:     Head: Normocephalic.     Left Ear: There is no impacted cerumen.     Nose: Nose normal.     Mouth/Throat:     Mouth: Mucous membranes are moist.     Pharynx: No posterior oropharyngeal erythema.  Eyes:     Extraocular Movements: Extraocular movements intact.     Pupils: Pupils are equal, round, and reactive to light.  Cardiovascular:     Rate and Rhythm: Regular rhythm.     Chest Wall: PMI is not displaced.     Pulses: Normal pulses.     Heart sounds: Normal heart sounds. No murmur  heard. Pulmonary:     Effort: Pulmonary effort is normal.     Breath sounds: Normal air entry. No rhonchi or rales.  Abdominal:     General: Abdomen is flat. Bowel sounds are normal. There is no distension.     Palpations: Abdomen is soft. There is no hepatomegaly, splenomegaly or mass.     Tenderness: There is no abdominal tenderness.  Musculoskeletal:     Left shoulder: Decreased range of motion. Decreased strength.     Cervical back: Normal range of motion and neck supple.     Right lower leg: No edema.     Left lower leg: No edema.  Skin:    General: Skin is warm and dry.  Neurological:     General: No focal deficit present.     Mental Status: He is alert and oriented to person, place, and time.     Cranial Nerves: No cranial nerve deficit.     Motor: No weakness.     Comments: Expressive aphasia  Psychiatric:        Mood and Affect: Mood normal.        Behavior: Behavior normal.      No results found for any visits on 10/29/23.      Assessment & Plan:  Winfield was seen today for follow-up.  Cerebrovascular accident (CVA) due to embolism of cerebral artery (HCC) -     CBC With Diff/Platelet  Allergic rhinitis due to pollen, unspecified seasonality -     Cetirizine  HCl; Take 1 tablet (10 mg total) by mouth daily.  Dispense: 30 tablet; Refill: 1 -     FLUoxetine  HCl; Take 2 capsules (40 mg total) by mouth daily.  Dispense: 180 capsule; Refill: 0  Mixed hyperlipidemia -     Comprehensive metabolic panel with GFR -     Lipid  panel  Aphasia    Problem List Items Addressed This Visit       Cardiovascular and Mediastinum   Cerebrovascular accident (CVA) due to embolism of cerebral artery (HCC) - Primary   Relevant Orders   CBC With Diff/Platelet     Respiratory   Allergic rhinitis due to pollen   Relevant Medications   cetirizine  (ZYRTEC  ALLERGY) 10 MG tablet   FLUoxetine  (PROZAC ) 20 MG capsule     Other   Hyperlipidemia   Relevant Orders    Comprehensive metabolic panel with GFR   Lipid panel   Other Visit Diagnoses       Aphasia           Return in about 7 weeks (around 12/17/2023) for awv with labs prior.   Total time spent: 30 minutes  Sherrill Cinderella Perry, MD  10/29/2023   This document may have been prepared by [facility] Voice Recognition software and as such may include unintentional dictation errors.

## 2023-10-29 NOTE — Telephone Encounter (Signed)
 Pt had appt with TJ today, in the hospital rx warfarin was switched to eliquis . He asked if you can send rx Eliquis  to his pharmacy, please advise

## 2023-10-29 NOTE — Telephone Encounter (Signed)
 Tried to call patient no answer . Send a my chart message

## 2023-10-30 ENCOUNTER — Ambulatory Visit: Admitting: Physical Therapy

## 2023-10-30 ENCOUNTER — Ambulatory Visit: Admitting: Speech Pathology

## 2023-10-30 DIAGNOSIS — R4701 Aphasia: Secondary | ICD-10-CM

## 2023-10-30 NOTE — Telephone Encounter (Signed)
 Patient scheduled for mri on 10/31/2023

## 2023-10-30 NOTE — Therapy (Signed)
 OUTPATIENT SPEECH LANGUAGE PATHOLOGY  SPEECH LANGUAGE TREATMENT NOTE   Patient Name: Gregory Lane MRN: [id number] DOB:[birth year], 73 y.o., male 67 Date: 10/30/2023  PCP: Denyse DELENA Bathe, MD REFERRING PROVIDER: Arland Earnie Pouch, MD    End of Session - 10/30/23 1014     Visit Number 15    Number of Visits 25    Date for SLP Re-Evaluation 11/20/23    Authorization Type [employer] [place]    Progress Note Due on Visit 20    SLP Start Time 1015    SLP Stop Time  1100    SLP Time Calculation (min) 45 min    Activity Tolerance Patient tolerated treatment well          Past Medical History:  Diagnosis Date   Anxiety    Aortic atherosclerosis (HCC) 02/16/2017   Arthritis    Blind left eye    COPD (chronic obstructive pulmonary disease) (HCC)    Depression    Dysrhythmia    A-fib   GERD (gastroesophageal reflux disease)    Hypertension    Low TSH level 02/17/2017   Myocardial infarction [facility])    PAD (peripheral artery disease) (HCC)    Paroxysmal atrial fibrillation (HCC)    Perforated duodenal ulcer (HCC)    Stroke (HCC)    left side is weak   Past Surgical History:  Procedure Laterality Date   BRONCHIAL BIOPSY  03/22/2022   Procedure: BRONCHIAL BIOPSIES;  Surgeon: Isadora Hose, MD;  [facility] END[facility];  Service: Pulmonary;;   BRONCHIAL NEEDLE ASPIRATION BIOPSY  03/22/2022   Procedure: BRONCHIAL NEEDLE ASPIRATION BIOPSIES;  Surgeon: Isadora Hose, MD;  [facility]: [facility];  Service: Pulmonary;;   ENDOBRONCHIAL ULTRASOUND  03/22/2022   Procedure: ENDOBRONCHIAL ULTRASOUND;  Surgeon: Isadora Hose, MD;  Location: [facility];  Service: Pulmonary;;   EXPLORATORY LAPAROTOMY  05/12/2015   EYE SURGERY     FRACTURE SURGERY     LAPAROTOMY N/A 05/10/2015   Procedure: EXPLORATORY LAPAROTOMY;  Surgeon: Lynda Leos, MD;  Location: [facility] OR;  Service: General;  Laterality: N/A;   LEFT HEART CATH AND CORONARY ANGIOGRAPHY N/A 05/02/2016   Procedure: Left Heart  Cath and Coronary Angiography;  Surgeon: Gordy Bergamo, MD;  Location: [facility] INVASIVE CV LAB;  Service: Cardiovascular;  Laterality: N/A;   LOWER EXTREMITY ANGIOGRAPHY Left 09/21/2021   Procedure: Lower Extremity Angiography;  Surgeon: Jama Cordella KANDICE, MD;  Location: [facility] INVASIVE CV LAB;  Service: Cardiovascular;  Laterality: Left;   Patient Active Problem List   Diagnosis Date Noted   Allergic rhinitis due to pollen 12/11/2022   Tinea cruris 12/11/2022   BPH associated with nocturia 12/11/2022   Chest pain, non-cardiac 04/11/2022   Adenocarcinoma of lower lobe of right lung (HCC) 03/28/2022   Pulmonary nodule 1 cm or greater in diameter 03/22/2022   Leg weakness, bilateral 02/09/2022   Osteopenia 10/13/2021   PAD (peripheral artery disease) (HCC) 09/21/2021   GERD without esophagitis 09/21/2021   Dyslipidemia 09/21/2021   Depression 09/21/2021   Atrial flutter (HCC) 09/21/2021   Chronic obstructive pulmonary disease (COPD) (HCC) 09/21/2021   Coronary artery disease 09/21/2021   Cerebrovascular accident (CVA) due to embolism of cerebral artery (HCC) 09/06/2021   Bilateral edema of lower extremity 08/05/2020   Vasovagal syncope 08/02/2020   Annual physical exam 12/26/2019   Hyperlipidemia 12/26/2019   Abrasion of right hand 12/26/2019   Metabolic syndrome 11/11/2019   Low TSH level 02/17/2017   Atrial flutter with rapid ventricular response (HCC) 02/16/2017  Cigarette smoker 02/16/2017   COPD with acute exacerbation (HCC) 02/16/2017   Elevated lactic acid level 02/16/2017   Aortic atherosclerosis (HCC) 02/16/2017   Productive cough    Hypoxia 05/24/2015   SOB (shortness of breath) 05/24/2015   COPD suggested by initial evaluation (HCC) 05/17/2015   Essential hypertension 05/17/2015   Smoking 05/17/2015    ONSET DATE: 08/09/2023   REFERRING DIAG: I63.9 (ICD-10-CM) - Cerebral infarction, unspecified   THERAPY DIAG:  Aphasia  Rationale for Evaluation and Treatment  Rehabilitation  SUBJECTIVE:   PERTINENT HISTORY and DIAGNOSTIC FINDINGS: Pt is a 73 year old right handed male who [facility] ED on 08/09/2023 with stroke-like symptoms - slurred speech and difficulty answering questions and weakness to his right side extremities. CT head and subsequent CTA head and neck shows left M2 occlusion with large penumbra in the region. Pt transferred to [facility] for mechanical thrombectomy on 08/10/2023. Pt admitted at [facility] from 08/10/2023 thru 08/21/2023 with recommended for home health.   MRI brain: Remonstrated left MCA territory infarct primarily involving the parietal lobe.   Additional medical history includes: blindness left eye, A-fib on warfarin, COPD, HFpEF, HTN, current smoker, ethanol abuse, RLL adenocarcinoma s/p SBRT (2024)     PAIN:  Are you having pain? Unable to communicate  FALLS: Has patient fallen in last 6 months?  No  LIVING ENVIRONMENT: Lives with: sister-in-law Lives in: House/apartment  PLOF:  Level of assistance: Independent with ADLs, Independent with IADLs, Comment: poor health literacy, difficulty with reading and writing per family report Employment: Retired   PATIENT GOALS    per pt's sister-in-law, she hopes he will talk again  SUBJECTIVE STATEMENT: Pt attended session with sister-in-law. Pt stated I'm alright. Pts sister-in-law reports pt is talking a lot clearer in conversation. Pt accompanied by: sister-in-law  OBJECTIVE:   TODAY'S TREATMENT:  Skilled ST treatment session targeted pt's cognitive communication, aphasia and apraxia of speech goals. SLP facilitated session by providing the following interventions:  Pt continues to express more intelligible comments and jokes throughout the session.   To increase comprehension skills, pictures of personal items (Pepsi, crocs, comb), days of the week, and everyday items (plate, pill bottle, knife) paired with their words were presented in the following:  SLP  verbally presented each picture and written word. Pt then said each word with the clinician. The last round consisted of pt saying word by self.  Personal items: when verbalized with SLP: 5/11; when verbalized by self: 2/11 with max A Days of the week: when verbalized with SLP: 4/7 with min A (verbal and visual); when verbalized by self: 3/7 with mod A and increased responsiveness to verbal model  Everyday items: when verbalized with SLP: 3/10 with max A; After ~ 20 items, it was observed (this session and last session) that pt began using perseverative neologisms. Will decrease number during next session.  Throughout this activity, pt displayed 2 instances of self-correction of unintelligible speech. Pt displayed groping to attempt to identity correct sounds. This shows an increased awareness of speech productions.   One step - Body movement pp. 7 utilized d/t increased auditory comprehension and awareness. SLP introduced 1-step directions to assess/target auditory comprehension and reading comprehension (SLP has previously rec. sister-in-law write down information to supplement conversation). Pt experienced difficulty understanding the task at hand with max A visual, gestural, and verbal cues to complete the task. SLP further facilitated this activity by asking the pt to point to various body parts. Pt able to complete  tasks in 8 out of 11 trials with moderate to maximal verbal and visual/tactile cues.   TalkPath Therapy App utilized to target auditory comprehension at the word level Word ID: Level 1: 9/14 with mod A semantic and visual cues  PATIENT EDUCATION: Education details: see above Person educated: Patient and his sister-in-law Education method: Explanation Education comprehension: needs further education  HOME EXERCISE PROGRAM:   Supplement verbal information with single written words  GOALS:  Goals reviewed with patient? Yes  SHORT TERM GOALS: Target date: 10 sessions  Updated:  10/10/2023 With maximal assistance, pt will select written word to express request for food/drink item in 7 out of 10 opportunities.  Baseline: Goal status: INITIAL: MET  2.  With maximal multimodal assistance, pt will answer yes/no questions in 8 out of 10 opportunities.  Baseline:  Goal status: INITIAL: Is able to answer using written language when choosing between written yes/no  3.  With maximal multimodal assistance, pt will verbally approximate common object names in 5 out of 10 opportunities.  Baseline:  Goal status: INITIAL: progress made   LONG TERM GOALS: Target date: 11/20/2023  Updated 10/10/2023 Pt will use multimodal communication and maximal assistance to communicate basic wants and needs in 2 out of 5 opportunities per pt and family report.  Baseline:  Goal status: INITIAL: progress  2.  With Maximal A, patient/family will demonstrate understanding of the following concepts: aphasia, spontaneous recovery, communication vs conversation, strengths/strategies to promote success, local resources by answering multiple choice questions with 50% accuracy when provided supported conversation in order to increase patient and caregiver's participation in medical care.  Baseline:  Goal status: INITIAL: progress   ASSESSMENT:  CLINICAL IMPRESSION: Patient is a 73 y.o. right handed male who was seen today for a speech language treatment d/t left MCA CVA.   Pt presents with the appearance of global aphasia - potentially Wernicke's Aphasia. See below for details.  Given the severity of pt's aphasia, he is not able to communicate any basic wants/needs which (per his family's report) results in increased pt frustration and agitation.   Verbal Communication Pt's verbal communication was fluent but nonsensical speech consisting of normal rate and rhythm but didn't convey meaning. Pt was not aware of unintelligible nature of speech with few rote utterances that were intelligible. As  such, pt was not able to name any basic common objects or produce rote utterances such as DOW, MOY (with written cues) or sing Happy Birthday. No ability to repeat basic words observed.   Auditory Comprehension Pt with moderately impaired auditory comprehension - given obejct name pt was able to select appropriate object in field of 2 in 4 out of 6 opportunities decreasing to 3 out of 8 opportunities with a field of 3, answered basic yes/no questions related to himself correctly in 4 out of 6 opportunities and followed basic 1-step directions related to self in 2 out of 4 opportunities.   Written Language During an unsuccessful attempt at verbal communication, he took this writer's pencil and attempted to write x 1 but was not able to write more than two letters ET but was able to write his first name.   Reading Comprehension Pt was able to pair written word with correct object in field of 3 with 100% accuracy (15 out of 15 opportunities).   Pt continues to participate and also demonstrates increased number of rote off the cuff responses that are intelligible. Receptive to attempts to copy written words - intermittent difficulty but good  self-monitoring observed.  See the above treatment note for details.   OBJECTIVE IMPAIRMENTS include expressive language, receptive language, aphasia, apraxia, and written language. These impairments are limiting patient from managing medications, managing appointments, managing finances, household responsibilities, ADLs/IADLs, and effectively communicating at home and in community. Factors affecting potential to achieve goals and functional outcome are severity of impairments, family/community support, and significantly reduced health literacy. Patient will benefit from skilled SLP services to address above impairments and improve overall function.  REHAB POTENTIAL: Good  PLAN: SLP FREQUENCY: 1-2x/week  SLP DURATION: 12 weeks  PLANNED INTERVENTIONS:  Language facilitation, Cueing hierachy, Internal/external aids, Functional tasks, Multimodal communication approach, SLP instruction and feedback, Compensatory strategies, and Patient/family education    Happi B. Rubbie, M.S., CCC-SLP, Tree surgeon Certified Brain Injury Specialist [facility]  [facility] Rehabilitation Services Office [phone] Ascom [phone] Fax [phone]

## 2023-10-31 ENCOUNTER — Ambulatory Visit
Admission: RE | Admit: 2023-10-31 | Discharge: 2023-10-31 | Disposition: A | Discharge status: Home / Self Care | Source: Ambulatory Visit | Attending: Urology | Admitting: Urology

## 2023-10-31 DIAGNOSIS — R972 Elevated prostate specific antigen [PSA]: Secondary | ICD-10-CM | POA: Diagnosis present

## 2023-10-31 MED ORDER — GADOBUTROL 1 MMOL/ML IV SOLN
8.0000 mL | Freq: Once | INTRAVENOUS | Status: AC | PRN
  Administered 2023-10-31: 8 mL via INTRAVENOUS

## 2023-11-01 ENCOUNTER — Ambulatory Visit: Admitting: Physical Therapy

## 2023-11-01 ENCOUNTER — Ambulatory Visit: Admitting: Speech Pathology

## 2023-11-01 DIAGNOSIS — R4701 Aphasia: Secondary | ICD-10-CM | POA: Diagnosis not present

## 2023-11-01 DIAGNOSIS — R482 Apraxia: Secondary | ICD-10-CM

## 2023-11-01 NOTE — Therapy (Signed)
 OUTPATIENT SPEECH LANGUAGE PATHOLOGY  SPEECH LANGUAGE TREATMENT NOTE   Patient Name: Gregory Lane MRN: [id number] DOB:1950/12/22, 73 y.o., male 43 Date: 11/01/2023  PCP: Denyse DELENA Bathe, MD REFERRING PROVIDER: Arland Earnie Pouch, MD    End of Session - 11/01/23 1403     Visit Number 16    Number of Visits 25    Date for SLP Re-Evaluation 11/20/23    Authorization Type [employer] [place]    Progress Note Due on Visit 20    SLP Start Time 1150    SLP Stop Time  1225    SLP Time Calculation (min) 35 min    Activity Tolerance Patient tolerated treatment well          Past Medical History:  Diagnosis Date   Anxiety    Aortic atherosclerosis (HCC) 02/16/2017   Arthritis    Blind left eye    COPD (chronic obstructive pulmonary disease) (HCC)    Depression    Dysrhythmia    A-fib   GERD (gastroesophageal reflux disease)    Hypertension    Low TSH level 02/17/2017   Myocardial infarction [facility])    PAD (peripheral artery disease) (HCC)    Paroxysmal atrial fibrillation (HCC)    Perforated duodenal ulcer (HCC)    Stroke (HCC)    left side is weak   Past Surgical History:  Procedure Laterality Date   BRONCHIAL BIOPSY  03/22/2022   Procedure: BRONCHIAL BIOPSIES;  Surgeon: Isadora Hose, MD;  Location: [facility] ENDOS[facility];  Service: Pulmonary;;   BRONCHIAL NEEDLE ASPIRATION BIOPSY  03/22/2022   Procedure: BRONCHIAL NEEDLE ASPIRATION BIOPSIES;  Surgeon: Isadora Hose, MD;  Location: [facility]OS[facility];  Service: Pulmonary;;   ENDOBRONCHIAL ULTRASOUND  03/22/2022   Procedure: ENDOBRONCHIAL ULTRASOUND;  Surgeon: Isadora Hose, MD;  Location: [facility]OS[facility];  Service: Pulmonary;;   EXPLORATORY LAPAROTOMY  05/12/2015   EYE SURGERY     FRACTURE SURGERY     LAPAROTOMY N/A 05/10/2015   Procedure: EXPLORATORY LAPAROTOMY;  Surgeon: Lynda Leos, MD;  Location: [facility] OR;  Service: General;  Laterality: N/A;   LEFT HEART CATH AND CORONARY ANGIOGRAPHY N/A 05/02/2016   Procedure: Left  Heart Cath and Coronary Angiography;  Surgeon: Gordy Bergamo, MD;  Location: [facility] INVASIVE CV LAB;  Service: Cardiovascular;  Laterality: N/A;   LOWER EXTREMITY ANGIOGRAPHY Left 09/21/2021   Procedure: Lower Extremity Angiography;  Surgeon: Jama Cordella KANDICE, MD;  Location: [facility] INVASIVE CV LAB;  Service: Cardiovascular;  Laterality: Left;   Patient Active Problem List   Diagnosis Date Noted   Allergic rhinitis due to pollen 12/11/2022   Tinea cruris 12/11/2022   BPH associated with nocturia 12/11/2022   Chest pain, non-cardiac 04/11/2022   Adenocarcinoma of lower lobe of right lung (HCC) 03/28/2022   Pulmonary nodule 1 cm or greater in diameter 03/22/2022   Leg weakness, bilateral 02/09/2022   Osteopenia 10/13/2021   PAD (peripheral artery disease) (HCC) 09/21/2021   GERD without esophagitis 09/21/2021   Dyslipidemia 09/21/2021   Depression 09/21/2021   Atrial flutter (HCC) 09/21/2021   Chronic obstructive pulmonary disease (COPD) (HCC) 09/21/2021   Coronary artery disease 09/21/2021   Cerebrovascular accident (CVA) due to embolism of cerebral artery (HCC) 09/06/2021   Bilateral edema of lower extremity 08/05/2020   Vasovagal syncope 08/02/2020   Annual physical exam 12/26/2019   Hyperlipidemia 12/26/2019   Abrasion of right hand 12/26/2019   Metabolic syndrome 11/11/2019   Low TSH level 02/17/2017   Atrial flutter with rapid ventricular response (HCC) 02/16/2017  Cigarette smoker 02/16/2017   COPD with acute exacerbation (HCC) 02/16/2017   Elevated lactic acid level 02/16/2017   Aortic atherosclerosis (HCC) 02/16/2017   Productive cough    Hypoxia 05/24/2015   SOB (shortness of breath) 05/24/2015   COPD suggested by initial evaluation (HCC) 05/17/2015   Essential hypertension 05/17/2015   Smoking 05/17/2015    ONSET DATE: 08/09/2023   REFERRING DIAG: I63.9 (ICD-10-CM) - Cerebral infarction, unspecified   THERAPY DIAG:  Aphasia  Apraxia  Rationale for Evaluation and  Treatment Rehabilitation  SUBJECTIVE:   PERTINENT HISTORY and DIAGNOSTIC FINDINGS: Pt is a 73 year old right handed male who presented to [facility] ED on 08/09/2023 with stroke-like symptoms - slurred speech and difficulty answering questions and weakness to his right side extremities. CT head and subsequent CTA head and neck shows left M2 occlusion with large penumbra in the region. Pt transferred to [facility] for mechanical thrombectomy on 08/10/2023. Pt admitted at [facility] from 08/10/2023 thru 08/21/2023 with recommended for home health.   MRI brain: Remonstrated left MCA territory infarct primarily involving the parietal lobe.   Additional medical history includes: blindness left eye, A-fib on warfarin, COPD, HFpEF, HTN, current smoker, ethanol abuse, RLL adenocarcinoma s/p SBRT (2024)     PAIN:  Are you having pain? Unable to communicate  FALLS: Has patient fallen in last 6 months?  No  LIVING ENVIRONMENT: Lives with: sister-in-law Lives in: House/apartment  PLOF:  Level of assistance: Independent with ADLs, Independent with IADLs, Comment: poor health literacy, difficulty with reading and writing per family report Employment: Retired   PATIENT GOALS    per pt's sister-in-law, she hopes he will talk again  SUBJECTIVE STATEMENT: Pt attended session with sister-in-law. Pt stated I'm alright Pt accompanied by: sister-in-law  OBJECTIVE:   TODAY'S TREATMENT:  Skilled ST treatment session targeted pt's cognitive communication, aphasia and apraxia of speech goals. SLP facilitated session by providing the following interventions:    When pt arrived to session he commented I got something for her but was not able to describe d/t neologistic jargon. Pt attempted to use gestures and wrote down Paper with improved ability to say paper. Unfortunately unable to explain d/t nature of language impairment.   To increase comprehension skills, pictures of days of the week paired with their words  were presented in the following:  SLP verbally presented each picture and written word. Pt then said each word with the clinician. The last round consisted of pt saying word by self.  Days of the week: when verbalized with SLP: 5/7 with no increase given max cues; when verbalized by self: 3/7 with no increase given max cues    Using a Following Directions Hierarchy, pt pointed to pictures of personal objects by their function (point to the one we drink) with 80% accuracy independently when given 3 choices. Improved to 100% accuracy when given min A. To increase difficulty, pt pointed to two personal objects by function in sequence (point to wear on feet then to drink). Pt independently sequenced 5 out of 6 trials. Improved to 100% accuracy when given min A. Pt asked to sequence two common objects by name when provided with three choices (point to brush then glasses). Once conditioned to the task, pt correctly identified and sequenced 6 out of 7 trials. Improved to 100% accuracy when given mod A.   SLP introduced simple yes/no questions to target auditory comprehension and awareness. Pt answered 16 out of 17 questions accurately with intermittent repetition of question.  PATIENT EDUCATION: Education details: see above Person educated: Patient and his sister-in-law Education method: Explanation Education comprehension: needs further education  HOME EXERCISE PROGRAM:   Supplement verbal information with single written words  GOALS:  Goals reviewed with patient? Yes  SHORT TERM GOALS: Target date: 10 sessions  Updated: 10/10/2023 With maximal assistance, pt will select written word to express request for food/drink item in 7 out of 10 opportunities.  Baseline: Goal status: INITIAL: MET  2.  With maximal multimodal assistance, pt will answer yes/no questions in 8 out of 10 opportunities.  Baseline:  Goal status: INITIAL: Is able to answer using written language when choosing between written  yes/no  3.  With maximal multimodal assistance, pt will verbally approximate common object names in 5 out of 10 opportunities.  Baseline:  Goal status: INITIAL: progress made   LONG TERM GOALS: Target date: 11/20/2023  Updated 10/10/2023 Pt will use multimodal communication and maximal assistance to communicate basic wants and needs in 2 out of 5 opportunities per pt and family report.  Baseline:  Goal status: INITIAL: progress  2.  With Maximal A, patient/family will demonstrate understanding of the following concepts: aphasia, spontaneous recovery, communication vs conversation, strengths/strategies to promote success, local resources by answering multiple choice questions with 50% accuracy when provided supported conversation in order to increase patient and caregiver's participation in medical care.  Baseline:  Goal status: INITIAL: progress   ASSESSMENT:  CLINICAL IMPRESSION: Patient is a 73 y.o. right handed male who was seen today for a speech language treatment d/t left MCA CVA.   Pt presents with the appearance of global aphasia - potentially Wernicke's Aphasia. See below for details.  Given the severity of pt's aphasia, he is not able to communicate any basic wants/needs which (per his family's report) results in increased pt frustration and agitation.   Verbal Communication Pt's verbal communication was fluent but nonsensical speech consisting of normal rate and rhythm but didn't convey meaning. Pt was not aware of unintelligible nature of speech with few rote utterances that were intelligible. As such, pt was not able to name any basic common objects or produce rote utterances such as DOW, MOY (with written cues) or sing Happy Birthday. No ability to repeat basic words observed.   Auditory Comprehension Pt with moderately impaired auditory comprehension - given obejct name pt was able to select appropriate object in field of 2 in 4 out of 6 opportunities decreasing to 3  out of 8 opportunities with a field of 3, answered basic yes/no questions related to himself correctly in 4 out of 6 opportunities and followed basic 1-step directions related to self in 2 out of 4 opportunities.   Written Language During an unsuccessful attempt at verbal communication, he took this writer's pencil and attempted to write x 1 but was not able to write more than two letters ET but was able to write his first name.   Reading Comprehension Pt was able to pair written word with correct object in field of 3 with 100% accuracy (15 out of 15 opportunities).   Pt continues to participate and also demonstrates increased number of rote off the cuff responses that are intelligible. Receptive to attempts to copy written words - intermittent difficulty but good self-monitoring observed.  See the above treatment note for details.   OBJECTIVE IMPAIRMENTS include expressive language, receptive language, aphasia, apraxia, and written language. These impairments are limiting patient from managing medications, managing appointments, managing finances, household responsibilities, ADLs/IADLs, and effectively communicating  at home and in community. Factors affecting potential to achieve goals and functional outcome are severity of impairments, family/community support, and significantly reduced health literacy. Patient will benefit from skilled SLP services to address above impairments and improve overall function.  REHAB POTENTIAL: Good  PLAN: SLP FREQUENCY: 1-2x/week  SLP DURATION: 12 weeks  PLANNED INTERVENTIONS: Language facilitation, Cueing hierachy, Internal/external aids, Functional tasks, Multimodal communication approach, SLP instruction and feedback, Compensatory strategies, and Patient/family education    Happi B. Rubbie, M.S., CCC-SLP, Tree surgeon Certified Brain Injury Specialist [facility]  [facility] Rehabilitation  Services Office [phone] Ascom [phone] Fax [phone]

## 2023-11-02 ENCOUNTER — Ambulatory Visit: Payer: Self-pay | Admitting: Urology

## 2023-11-06 ENCOUNTER — Encounter: Admitting: Occupational Therapy

## 2023-11-06 ENCOUNTER — Ambulatory Visit

## 2023-11-06 ENCOUNTER — Ambulatory Visit: Admitting: Speech Pathology

## 2023-11-06 DIAGNOSIS — R4701 Aphasia: Secondary | ICD-10-CM

## 2023-11-06 NOTE — Therapy (Signed)
 OUTPATIENT SPEECH LANGUAGE PATHOLOGY  SPEECH LANGUAGE TREATMENT NOTE   Patient Name: Gregory Lane MRN: [id number] DOB:10-13-1950, 73 y.o., male 83 Date: 11/06/2023  PCP: Denyse DELENA Bathe, MD REFERRING PROVIDER: Arland Earnie Pouch, MD    End of Session - 11/06/23 1444     Visit Number 17    Number of Visits 25    Date for SLP Re-Evaluation 11/20/23    Authorization Type [employer] [place]    Progress Note Due on Visit 20    SLP Start Time 1445    SLP Stop Time  1530    SLP Time Calculation (min) 45 min    Activity Tolerance Patient tolerated treatment well          Past Medical History:  Diagnosis Date   Anxiety    Aortic atherosclerosis (HCC) 02/16/2017   Arthritis    Blind left eye    COPD (chronic obstructive pulmonary disease) (HCC)    Depression    Dysrhythmia    A-fib   GERD (gastroesophageal reflux disease)    Hypertension    Low TSH level 02/17/2017   Myocardial infarction [facility])    PAD (peripheral artery disease) (HCC)    Paroxysmal atrial fibrillation (HCC)    Perforated duodenal ulcer (HCC)    Stroke (HCC)    left side is weak   Past Surgical History:  Procedure Laterality Date   BRONCHIAL BIOPSY  03/22/2022   Procedure: BRONCHIAL BIOPSIES;  Surgeon: Isadora Hose, MD;  [facility]: [facility];  Service: Pulmonary;;   BRONCHIAL NEEDLE ASPIRATION BIOPSY  03/22/2022   Procedure: BRONCHIAL NEEDLE ASPIRATION BIOPSIES;  Surgeon: Isadora Hose, MD;  Location: [facility];  Service: Pulmonary;;   ENDOBRONCHIAL ULTRASOUND  03/22/2022   Procedure: ENDOBRONCHIAL ULTRASOUND;  Surgeon: Isadora Hose, MD;  [facility]: [facility];  Service: Pulmonary;;   EXPLORATORY LAPAROTOMY  05/12/2015   EYE SURGERY     FRACTURE SURGERY     LAPAROTOMY N/A 05/10/2015   Procedure: EXPLORATORY LAPAROTOMY;  Surgeon: Lynda Leos, MD;  Location: [facility] OR;  Service: General;  Laterality: N/A;   LEFT HEART CATH AND CORONARY ANGIOGRAPHY N/A 05/02/2016   Procedure: Left  Heart Cath and Coronary Angiography;  Surgeon: Gordy Bergamo, MD;  [facility]: [facility] INVASIVE CV LAB;  Service: Cardiovascular;  Laterality: N/A;   LOWER EXTREMITY ANGIOGRAPHY Left 09/21/2021   Procedure: Lower Extremity Angiography;  Surgeon: Jama Cordella KANDICE, MD;  Location: [facility] INVASIVE CV LAB;  Service: Cardiovascular;  Laterality: Left;   Patient Active Problem List   Diagnosis Date Noted   Allergic rhinitis due to pollen 12/11/2022   Tinea cruris 12/11/2022   BPH associated with nocturia 12/11/2022   Chest pain, non-cardiac 04/11/2022   Adenocarcinoma of lower lobe of right lung (HCC) 03/28/2022   Pulmonary nodule 1 cm or greater in diameter 03/22/2022   Leg weakness, bilateral 02/09/2022   Osteopenia 10/13/2021   PAD (peripheral artery disease) (HCC) 09/21/2021   GERD without esophagitis 09/21/2021   Dyslipidemia 09/21/2021   Depression 09/21/2021   Atrial flutter (HCC) 09/21/2021   Chronic obstructive pulmonary disease (COPD) (HCC) 09/21/2021   Coronary artery disease 09/21/2021   Cerebrovascular accident (CVA) due to embolism of cerebral artery (HCC) 09/06/2021   Bilateral edema of lower extremity 08/05/2020   Vasovagal syncope 08/02/2020   Annual physical exam 12/26/2019   Hyperlipidemia 12/26/2019   Abrasion of right hand 12/26/2019   Metabolic syndrome 11/11/2019   Low TSH level 02/17/2017   Atrial flutter with rapid ventricular response (HCC) 02/16/2017  Cigarette smoker 02/16/2017   COPD with acute exacerbation (HCC) 02/16/2017   Elevated lactic acid level 02/16/2017   Aortic atherosclerosis (HCC) 02/16/2017   Productive cough    Hypoxia 05/24/2015   SOB (shortness of breath) 05/24/2015   COPD suggested by initial evaluation (HCC) 05/17/2015   Essential hypertension 05/17/2015   Smoking 05/17/2015    ONSET DATE: 08/09/2023   REFERRING DIAG: I63.9 (ICD-10-CM) - Cerebral infarction, unspecified   THERAPY DIAG:  Aphasia  Rationale for Evaluation and Treatment  Rehabilitation  SUBJECTIVE:   PERTINENT HISTORY and DIAGNOSTIC FINDINGS: Pt is a 73 year old right handed male who presented to [facility] ED on 08/09/2023 with stroke-like symptoms - slurred speech and difficulty answering questions and weakness to his right side extremities. CT head and subsequent CTA head and neck shows left M2 occlusion with large penumbra in the region. Pt transferred to [facility] for mechanical thrombectomy on 08/10/2023. Pt admitted at [facility] from 08/10/2023 thru 08/21/2023 with recommended for home health.   MRI brain: Remonstrated left MCA territory infarct primarily involving the parietal lobe.   Additional medical history includes: blindness left eye, A-fib on warfarin, COPD, HFpEF, HTN, current smoker, ethanol abuse, RLL adenocarcinoma s/p SBRT (2024)     PAIN:  Are you having pain? Unable to communicate  FALLS: Has patient fallen in last 6 months?  No  LIVING ENVIRONMENT: Lives with: sister-in-law Lives in: House/apartment  PLOF:  Level of assistance: Independent with ADLs, Independent with IADLs, Comment: poor health literacy, difficulty with reading and writing per family report Employment: Retired   PATIENT GOALS    per pt's sister-in-law, she hopes he will talk again  SUBJECTIVE STATEMENT: Pt pleasant, attended session with sister-in-law Pt accompanied by: sister-in-law  OBJECTIVE:   TODAY'S TREATMENT:  Skilled ST treatment session targeted pt's cognitive communication, aphasia and apraxia of speech goals. SLP facilitated session by providing the following interventions:  When pt arrived to session he commented where is your pad at and no you see it. Pt gave this writer his homework to show one of the questions were inaccurate. During conversation, more words were recognizable and pt displayed improved awareness. Sister-in-law reports pt is talking much clearer at home.   To increase comprehension skills, pictures of days of the week paired with their  words were presented in the following:  SLP verbally presented each picture and written word. Pt then said each word with the clinician. The last round consisted of pt saying word by self.  Personal Items: when verbalized with SLP: achieved 8/11 independently, improved to 11/11 with min A; when verbalized by self: achieved 5/11 independently, improved to 9/11 with min A    Starting at stage 4 using the Following Directions Hierarchy, pt asked to sequence two common objects by name when provided with three choices (point to brush then glasses). Pt correctly identified and sequenced 4 out of 7 trials. Improved to 100% accuracy when given min A. SLP further facilitated activity by moving up to stage 5: point to 1 object spelled out of 3 choices. Once conditioned to task, pt correctly identified 8 out of 11 trials. Improved to 10 out of 11 trials when given min A.   SLP asked pt simple yes/no questions to target auditory comprehension and awareness. Pt correctly answered 14 out of 18 questions. Improved to 100% accuracy with intermittent repetition of question and written word.   PATIENT EDUCATION: Education details: see above Person educated: Patient and his sister-in-law Education method: Explanation Education comprehension: needs  further education  HOME EXERCISE PROGRAM:   Supplement verbal information with single written words  GOALS:  Goals reviewed with patient? Yes  SHORT TERM GOALS: Target date: 10 sessions  Updated: 10/10/2023 With maximal assistance, pt will select written word to express request for food/drink item in 7 out of 10 opportunities.  Baseline: Goal status: INITIAL: MET  2.  With maximal multimodal assistance, pt will answer yes/no questions in 8 out of 10 opportunities.  Baseline:  Goal status: INITIAL: Is able to answer using written language when choosing between written yes/no  3.  With maximal multimodal assistance, pt will verbally approximate common object  names in 5 out of 10 opportunities.  Baseline:  Goal status: INITIAL: progress made   LONG TERM GOALS: Target date: 11/20/2023  Updated 10/10/2023 Pt will use multimodal communication and maximal assistance to communicate basic wants and needs in 2 out of 5 opportunities per pt and family report.  Baseline:  Goal status: INITIAL: progress  2.  With Maximal A, patient/family will demonstrate understanding of the following concepts: aphasia, spontaneous recovery, communication vs conversation, strengths/strategies to promote success, local resources by answering multiple choice questions with 50% accuracy when provided supported conversation in order to increase patient and caregiver's participation in medical care.  Baseline:  Goal status: INITIAL: progress   ASSESSMENT:  CLINICAL IMPRESSION: Patient is a 73 y.o. right handed male who was seen today for a speech language treatment d/t left MCA CVA.   Pt presents with the appearance of global aphasia - potentially Wernicke's Aphasia. See below for details.  Given the severity of pt's aphasia, he is not able to communicate any basic wants/needs which (per his family's report) results in increased pt frustration and agitation.   Verbal Communication Pt's verbal communication was fluent but nonsensical speech consisting of normal rate and rhythm but didn't convey meaning. Pt was not aware of unintelligible nature of speech with few rote utterances that were intelligible. As such, pt was not able to name any basic common objects or produce rote utterances such as DOW, MOY (with written cues) or sing Happy Birthday. No ability to repeat basic words observed.   Auditory Comprehension Pt with moderately impaired auditory comprehension - given obejct name pt was able to select appropriate object in field of 2 in 4 out of 6 opportunities decreasing to 3 out of 8 opportunities with a field of 3, answered basic yes/no questions related to himself  correctly in 4 out of 6 opportunities and followed basic 1-step directions related to self in 2 out of 4 opportunities.   Written Language During an unsuccessful attempt at verbal communication, he took this writer's pencil and attempted to write x 1 but was not able to write more than two letters ET but was able to write his first name.   Reading Comprehension Pt was able to pair written word with correct object in field of 3 with 100% accuracy (15 out of 15 opportunities).   See the above treatment note for details.   OBJECTIVE IMPAIRMENTS include expressive language, receptive language, aphasia, apraxia, and written language. These impairments are limiting patient from managing medications, managing appointments, managing finances, household responsibilities, ADLs/IADLs, and effectively communicating at home and in community. Factors affecting potential to achieve goals and functional outcome are severity of impairments, family/community support, and significantly reduced health literacy. Patient will benefit from skilled SLP services to address above impairments and improve overall function.  REHAB POTENTIAL: Good  PLAN: SLP FREQUENCY: 1-2x/week  SLP  DURATION: 12 weeks  PLANNED INTERVENTIONS: Language facilitation, Cueing hierachy, Internal/external aids, Functional tasks, Multimodal communication approach, SLP instruction and feedback, Compensatory strategies, and Patient/family education   Hassell Roys, SLP Student Clinician  Happi B. Rubbie, M.S., CCC-SLP, Tree surgeon Certified Brain Injury Specialist [facility]  [facility] Rehabilitation Services Office [phone] Ascom [phone] Fax [phone]

## 2023-11-07 ENCOUNTER — Telehealth: Payer: Self-pay | Admitting: Urology

## 2023-11-07 ENCOUNTER — Telehealth (INDEPENDENT_AMBULATORY_CARE_PROVIDER_SITE_OTHER): Payer: Self-pay | Admitting: Vascular Surgery

## 2023-11-07 NOTE — Telephone Encounter (Signed)
 Patient also had an abnormality of one of the veins in his leg incidentally seen on MRI.  I asked vascular surgery to review and they recommended repeating a vascular ultrasound in ~2 months.  [facility] will contact him for a follow-up appointment

## 2023-11-07 NOTE — Telephone Encounter (Signed)
 Tried to call patient x2 to scheduled at [facility] per Dr. Twylla. No answer and no answering machine to leave message.   follow up in 6-8 weeks with a right groin duplex *Per Dr. Twylla secure chat to [facility] Possible thrombosed pseudoaneurysm or hematoma along the anterior wall of the right lower common femoral vein/proximal superficial femoral vein measuring 2.6 by 2.2 cm. This was not present on 08/16/2022.

## 2023-11-08 ENCOUNTER — Ambulatory Visit: Admitting: Speech Pathology

## 2023-11-08 ENCOUNTER — Ambulatory Visit: Admitting: Physical Therapy

## 2023-11-08 ENCOUNTER — Encounter: Admitting: Occupational Therapy

## 2023-11-08 DIAGNOSIS — R4701 Aphasia: Secondary | ICD-10-CM | POA: Diagnosis not present

## 2023-11-08 NOTE — Therapy (Signed)
 OUTPATIENT SPEECH LANGUAGE PATHOLOGY  SPEECH LANGUAGE TREATMENT NOTE   Patient Name: Gregory Lane MRN: [id number] DOB:11-09-1950, 73 y.o., male 30 Date: 11/08/2023  PCP: Denyse DELENA Bathe, MD REFERRING PROVIDER: Arland Earnie Pouch, MD    End of Session - 11/08/23 1400     Visit Number 18    Number of Visits 25    Date for Recertification  11/20/23    Authorization Type [employer] [place]    Progress Note Due on Visit 20    SLP Start Time 1400    SLP Stop Time  1445    SLP Time Calculation (min) 45 min    Activity Tolerance Patient tolerated treatment well          Past Medical History:  Diagnosis Date   Anxiety    Aortic atherosclerosis (HCC) 02/16/2017   Arthritis    Blind left eye    COPD (chronic obstructive pulmonary disease) (HCC)    Depression    Dysrhythmia    A-fib   GERD (gastroesophageal reflux disease)    Hypertension    Low TSH level 02/17/2017   Myocardial infarction [facility])    PAD (peripheral artery disease) (HCC)    Paroxysmal atrial fibrillation (HCC)    Perforated duodenal ulcer (HCC)    Stroke (HCC)    left side is weak   Past Surgical History:  Procedure Laterality Date   BRONCHIAL BIOPSY  03/22/2022   Procedure: BRONCHIAL BIOPSIES;  Surgeon: Isadora Hose, MD;  [facility]: [facility];  Service: Pulmonary;;   BRONCHIAL NEEDLE ASPIRATION BIOPSY  03/22/2022   Procedure: BRONCHIAL NEEDLE ASPIRATION BIOPSIES;  Surgeon: Isadora Hose, MD;  Location: [facility];  Service: Pulmonary;;   ENDOBRONCHIAL ULTRASOUND  03/22/2022   Procedure: ENDOBRONCHIAL ULTRASOUND;  Surgeon: Isadora Hose, MD;  [facility]: [facility];  Service: Pulmonary;;   EXPLORATORY LAPAROTOMY  05/12/2015   EYE SURGERY     FRACTURE SURGERY     LAPAROTOMY N/A 05/10/2015   Procedure: EXPLORATORY LAPAROTOMY;  Surgeon: Lynda Leos, MD;  Location: [facility] OR;  Service: General;  Laterality: N/A;   LEFT HEART CATH AND CORONARY ANGIOGRAPHY N/A 05/02/2016   Procedure: Left Heart  Cath and Coronary Angiography;  Surgeon: Gordy Bergamo, MD;  Location: [facility] INVASIVE CV LAB;  Service: Cardiovascular;  Laterality: N/A;   LOWER EXTREMITY ANGIOGRAPHY Left 09/21/2021   Procedure: Lower Extremity Angiography;  Surgeon: Jama Cordella KANDICE, MD;  Location: [facility] INVASIVE CV LAB;  Service: Cardiovascular;  Laterality: Left;   Patient Active Problem List   Diagnosis Date Noted   Allergic rhinitis due to pollen 12/11/2022   Tinea cruris 12/11/2022   BPH associated with nocturia 12/11/2022   Chest pain, non-cardiac 04/11/2022   Adenocarcinoma of lower lobe of right lung (HCC) 03/28/2022   Pulmonary nodule 1 cm or greater in diameter 03/22/2022   Leg weakness, bilateral 02/09/2022   Osteopenia 10/13/2021   PAD (peripheral artery disease) (HCC) 09/21/2021   GERD without esophagitis 09/21/2021   Dyslipidemia 09/21/2021   Depression 09/21/2021   Atrial flutter (HCC) 09/21/2021   Chronic obstructive pulmonary disease (COPD) (HCC) 09/21/2021   Coronary artery disease 09/21/2021   Cerebrovascular accident (CVA) due to embolism of cerebral artery (HCC) 09/06/2021   Bilateral edema of lower extremity 08/05/2020   Vasovagal syncope 08/02/2020   Annual physical exam 12/26/2019   Hyperlipidemia 12/26/2019   Abrasion of right hand 12/26/2019   Metabolic syndrome 11/11/2019   Low TSH level 02/17/2017   Atrial flutter with rapid ventricular response (HCC) 02/16/2017  Cigarette smoker 02/16/2017   COPD with acute exacerbation (HCC) 02/16/2017   Elevated lactic acid level 02/16/2017   Aortic atherosclerosis (HCC) 02/16/2017   Productive cough    Hypoxia 05/24/2015   SOB (shortness of breath) 05/24/2015   COPD suggested by initial evaluation (HCC) 05/17/2015   Essential hypertension 05/17/2015   Smoking 05/17/2015    ONSET DATE: 08/09/2023   REFERRING DIAG: I63.9 (ICD-10-CM) - Cerebral infarction, unspecified   THERAPY DIAG:  Aphasia  Rationale for Evaluation and Treatment  Rehabilitation  SUBJECTIVE:   PERTINENT HISTORY and DIAGNOSTIC FINDINGS: Pt is a 73 year old right handed male who presented to [facility] ED on 08/09/2023 with stroke-like symptoms - slurred speech and difficulty answering questions and weakness to his right side extremities. CT head and subsequent CTA head and neck shows left M2 occlusion with large penumbra in the region. Pt transferred to [facility] for mechanical thrombectomy on 08/10/2023. Pt admitted at [facility] from 08/10/2023 thru 08/21/2023 with recommended for home health.   MRI brain: Remonstrated left MCA territory infarct primarily involving the parietal lobe.   Additional medical history includes: blindness left eye, A-fib on warfarin, COPD, HFpEF, HTN, current smoker, ethanol abuse, RLL adenocarcinoma s/p SBRT (2024)     PAIN:  Are you having pain? Unable to communicate  FALLS: Has patient fallen in last 6 months?  No  LIVING ENVIRONMENT: Lives with: sister-in-law Lives in: House/apartment  PLOF:  Level of assistance: Independent with ADLs, Independent with IADLs, Comment: poor health literacy, difficulty with reading and writing per family report Employment: Retired   PATIENT GOALS    per pt's sister-in-law, she hopes he will talk again  SUBJECTIVE STATEMENT: Pt pleasant, attended session with sister-in-law, he brought in some pecans from his tree Pt accompanied by: sister-in-law  OBJECTIVE:   TODAY'S TREATMENT:  Skilled ST treatment session targeted pt's cognitive communication, aphasia and apraxia of speech goals. SLP facilitated session by providing the following interventions:  To increase comprehension skills, pictures of days of the week paired with their words were presented in the following:  SLP verbally presented each picture and written word. Pt then said each word with the clinician. The last round consisted of pt saying word by self.  Days of the week: when verbalized with SLP: achieved 3/7 independently, improved  to 4/7 with max A; when verbalized by self: achieved 5/7 independently, no increase in accuracy when given max A    Starting at stage 6 using the Following Directions Hierarchy, pt asked to point to one object described with 3 descriptors (blue, cold, liquid for Pepsi) when provided with three choices (Pepsi, shirt, razor). Pt achieved 100% accuracy independently. SLP further facilitated activity by moving up to stage 8: point in sequence to 3 common objects by name (Shirt, razor, napkin). Pt correctly identified 8 out of 9 trials independently. Improved to 9 out of 9 trials when given min A.   SLP asked pt simple yes/no questions to target auditory comprehension and awareness. Pt correctly answered 10 out of 14 questions. Improved to 13 out of 14 with minimum to maximum A.    PATIENT EDUCATION: Education details: see above Person educated: Patient and his sister-in-law Education method: Explanation Education comprehension: needs further education  HOME EXERCISE PROGRAM:   Supplement verbal information with single written words  GOALS:  Goals reviewed with patient? Yes  SHORT TERM GOALS: Target date: 10 sessions  Updated: 10/10/2023 With maximal assistance, pt will select written word to express request for food/drink item in  7 out of 10 opportunities.  Baseline: Goal status: INITIAL: MET  2.  With maximal multimodal assistance, pt will answer yes/no questions in 8 out of 10 opportunities.  Baseline:  Goal status: INITIAL: Is able to answer using written language when choosing between written yes/no  3.  With maximal multimodal assistance, pt will verbally approximate common object names in 5 out of 10 opportunities.  Baseline:  Goal status: INITIAL: progress made   LONG TERM GOALS: Target date: 11/20/2023  Updated 10/10/2023 Pt will use multimodal communication and maximal assistance to communicate basic wants and needs in 2 out of 5 opportunities per pt and family report.   Baseline:  Goal status: INITIAL: progress  2.  With Maximal A, patient/family will demonstrate understanding of the following concepts: aphasia, spontaneous recovery, communication vs conversation, strengths/strategies to promote success, local resources by answering multiple choice questions with 50% accuracy when provided supported conversation in order to increase patient and caregiver's participation in medical care.  Baseline:  Goal status: INITIAL: progress   ASSESSMENT:  CLINICAL IMPRESSION: Patient is a 73 y.o. right handed male who was seen today for a speech language treatment d/t left MCA CVA.   Pt presents with the appearance of global aphasia - potentially Wernicke's Aphasia. See below for details.  Given the severity of pt's aphasia, he is not able to communicate any basic wants/needs which (per his family's report) results in increased pt frustration and agitation.   Verbal Communication Pt's verbal communication was fluent but nonsensical speech consisting of normal rate and rhythm but didn't convey meaning. Pt was not aware of unintelligible nature of speech with few rote utterances that were intelligible. As such, pt was not able to name any basic common objects or produce rote utterances such as DOW, MOY (with written cues) or sing Happy Birthday. No ability to repeat basic words observed.   Auditory Comprehension Pt with moderately impaired auditory comprehension - given obejct name pt was able to select appropriate object in field of 2 in 4 out of 6 opportunities decreasing to 3 out of 8 opportunities with a field of 3, answered basic yes/no questions related to himself correctly in 4 out of 6 opportunities and followed basic 1-step directions related to self in 2 out of 4 opportunities.   Written Language During an unsuccessful attempt at verbal communication, he took this writer's pencil and attempted to write x 1 but was not able to write more than two letters ET  but was able to write his first name.   Reading Comprehension Pt was able to pair written word with correct object in field of 3 with 100% accuracy (15 out of 15 opportunities).   See the above treatment note for details.   OBJECTIVE IMPAIRMENTS include expressive language, receptive language, aphasia, apraxia, and written language. These impairments are limiting patient from managing medications, managing appointments, managing finances, household responsibilities, ADLs/IADLs, and effectively communicating at home and in community. Factors affecting potential to achieve goals and functional outcome are severity of impairments, family/community support, and significantly reduced health literacy. Patient will benefit from skilled SLP services to address above impairments and improve overall function.  REHAB POTENTIAL: Good  PLAN: SLP FREQUENCY: 1-2x/week  SLP DURATION: 12 weeks  PLANNED INTERVENTIONS: Language facilitation, Cueing hierachy, Internal/external aids, Functional tasks, Multimodal communication approach, SLP instruction and feedback, Compensatory strategies, and Patient/family education   Hassell Roys, SLP Student Clinician  Happi B. Rubbie, M.S., CCC-SLP, CBIS Speech-Language Pathologist Certified Brain Injury Specialist [facility]  [facility] Rehabilitation Services Office [phone] Ascom [phone] Fax [phone]

## 2023-11-13 ENCOUNTER — Ambulatory Visit: Admitting: Speech Pathology

## 2023-11-13 ENCOUNTER — Encounter: Admitting: Occupational Therapy

## 2023-11-13 ENCOUNTER — Ambulatory Visit: Admitting: Physical Therapy

## 2023-11-13 DIAGNOSIS — R4701 Aphasia: Secondary | ICD-10-CM | POA: Diagnosis not present

## 2023-11-13 DIAGNOSIS — R482 Apraxia: Secondary | ICD-10-CM

## 2023-11-13 NOTE — Therapy (Signed)
 OUTPATIENT SPEECH LANGUAGE PATHOLOGY  SPEECH LANGUAGE TREATMENT NOTE   Patient Name: Gregory Lane MRN: [id number] DOB:1950-10-25, 73 y.o., male 63 Date: 11/13/2023  PCP: Denyse DELENA Bathe, MD REFERRING PROVIDER: Arland Earnie Pouch, MD    End of Session - 11/13/23 1444     Visit Number 19    Number of Visits 25    Date for Recertification  11/20/23    Authorization Type [employer] [facility] [place]    Progress Note Due on Visit 20    SLP Start Time 1445    SLP Stop Time  1530    SLP Time Calculation (min) 45 min    Activity Tolerance Patient tolerated treatment well          Past Medical History:  Diagnosis Date   Anxiety    Aortic atherosclerosis 02/16/2017   Arthritis    Blind left eye    COPD (chronic obstructive pulmonary disease) (HCC)    Depression    Dysrhythmia    A-fib   GERD (gastroesophageal reflux disease)    Hypertension    Low TSH level 02/17/2017   Myocardial infarction [facility])    PAD (peripheral artery disease)    Paroxysmal atrial fibrillation (HCC)    Perforated duodenal ulcer (HCC)    Stroke (HCC)    left side is weak   Past Surgical History:  Procedure Laterality Date   BRONCHIAL BIOPSY  03/22/2022   Procedure: BRONCHIAL BIOPSIES;  Surgeon: Isadora Hose, MD;  Location: [facility] ENDOSCOPY;  Service: Pulmonary;;   BRONCHIAL NEEDLE ASPIRATION BIOPSY  03/22/2022   Procedure: BRONCHIAL NEEDLE ASPIRATION BIOPSIES;  Surgeon: Isadora Hose, MD;  Location: [facility];  Service: Pulmonary;;   ENDOBRONCHIAL ULTRASOUND  03/22/2022   Procedure: ENDOBRONCHIAL ULTRASOUND;  Surgeon: Isadora Hose, MD;  Location: [facility]OS[facility];  Service: Pulmonary;;   EXPLORATORY LAPAROTOMY  05/12/2015   EYE SURGERY     FRACTURE SURGERY     LAPAROTOMY N/A 05/10/2015   Procedure: EXPLORATORY LAPAROTOMY;  Surgeon: Lynda Leos, MD;  Location: [facility] OR;  Service: General;  Laterality: N/A;   LEFT HEART CATH AND CORONARY ANGIOGRAPHY N/A 05/02/2016   Procedure: Left Heart Cath and  Coronary Angiography;  Surgeon: Gordy Bergamo, MD;  Location: [facility] INVASIVE CV LAB;  Service: Cardiovascular;  Laterality: N/A;   LOWER EXTREMITY ANGIOGRAPHY Left 09/21/2021   Procedure: Lower Extremity Angiography;  Surgeon: Jama Cordella KANDICE, MD;  Location: [facility] INVASIVE CV LAB;  Service: Cardiovascular;  Laterality: Left;   Patient Active Problem List   Diagnosis Date Noted   Allergic rhinitis due to pollen 12/11/2022   Tinea cruris 12/11/2022   BPH associated with nocturia 12/11/2022   Chest pain, non-cardiac 04/11/2022   Adenocarcinoma of lower lobe of right lung (HCC) 03/28/2022   Pulmonary nodule 1 cm or greater in diameter 03/22/2022   Leg weakness, bilateral 02/09/2022   Osteopenia 10/13/2021   PAD (peripheral artery disease) 09/21/2021   GERD without esophagitis 09/21/2021   Dyslipidemia 09/21/2021   Depression 09/21/2021   Atrial flutter (HCC) 09/21/2021   Chronic obstructive pulmonary disease (COPD) (HCC) 09/21/2021   Coronary artery disease 09/21/2021   Cerebrovascular accident (CVA) due to embolism of cerebral artery (HCC) 09/06/2021   Bilateral edema of lower extremity 08/05/2020   Vasovagal syncope 08/02/2020   Annual physical exam 12/26/2019   Hyperlipidemia 12/26/2019   Abrasion of right hand 12/26/2019   Metabolic syndrome 11/11/2019   Low TSH level 02/17/2017   Atrial flutter with rapid ventricular response (HCC) 02/16/2017   Cigarette smoker  02/16/2017   COPD with acute exacerbation (HCC) 02/16/2017   Elevated lactic acid level 02/16/2017   Aortic atherosclerosis 02/16/2017   Productive cough    Hypoxia 05/24/2015   SOB (shortness of breath) 05/24/2015   COPD suggested by initial evaluation 05/17/2015   Essential hypertension 05/17/2015   Smoking 05/17/2015    ONSET DATE: 08/09/2023   REFERRING DIAG: I63.9 (ICD-10-CM) - Cerebral infarction, unspecified   THERAPY DIAG:  Aphasia  Apraxia  Rationale for Evaluation and Treatment  Rehabilitation  SUBJECTIVE:   PERTINENT HISTORY and DIAGNOSTIC FINDINGS: Pt is a 73 year old right handed male who presented [facility] ED on 08/09/2023 with stroke-like symptoms - slurred speech and difficulty answering questions and weakness to his right side extremities. CT head and subsequent CTA head and neck shows left M2 occlusion with large penumbra in the region. [facility] for [facility] thromb[facility] on 08/10/2023. Pt admitted at [facility] from 08/10/2023 thru 08/21/2023 with recommended for home health.   MRI brain: Remonstrated left MCA territory infarct primarily involving the parietal lobe.   Additional medical history includes: blindness left eye, A-fib on warfarin, COPD, HFpEF, HTN, current smoker, ethanol abuse, RLL adenocarcinoma s/p SBRT (2024)     PAIN:  Are you having pain? Unable to communicate  FALLS: Has patient fallen in last 6 months?  No  LIVING ENVIRONMENT: Lives with: sister-in-law Lives in: House/apartment  PLOF:  Level of assistance: Independent with ADLs, Independent with IADLs, Comment: poor health literacy, difficulty with reading and writing per family report Employment: Retired   PATIENT GOALS    per pt's sister-in-law, she hopes he will talk again  SUBJECTIVE STATEMENT: Pt pleasant, attended session with sister-in-law Pt accompanied by: sister-in-law  OBJECTIVE:   TODAY'S TREATMENT:  Skilled ST treatment session targeted pt's cognitive communication, aphasia and apraxia of speech goals. SLP facilitated session by providing the following interventions:    Pt with continued increased intelligible utterances when commenting on activities Sister-in-law stated he's doing real well, he is saying a lot of things better  To increase comprehension skills, pictures of personal items paired with their words were presented in the following:  SLP verbally presented each picture and written word. Pt then said each word with the clinician. The last  round consisted of pt saying word by self.  Personal items: when verbalized with SLP: achieved 5 out of 11 trials independently, improved to 7 out of 11 with max A; when verbalized by self: achieved 8 out of 11 trials independently, no increase in accuracy when given max A. When errors were made, pronunciation was closer and contained more appropriate phonemes.    Pt asked to point to three out of four objects by name (point to water, suspenders, shoes). Pt independently achieved 5 out of 11 trials, improved to 8 out of 11 trials when given max A.   SLP asked pt to point to common body parts to target auditory comprehension. Pt independently pointed to 10 out of 13 body parts. Improved to 13 out of 13 with minimum to maximum A.    SLP further facilitated session with Matching Words to Definitions pp 19-20. Pt independently matched words to their definitions in 10 out of 19 trials, accuracy improved with max A.   PATIENT EDUCATION: Education details: see above Person educated: Patient and his sister-in-law Education method: Explanation Education comprehension: needs further education  HOME EXERCISE PROGRAM:   Supplement verbal information with single written words  GOALS:  Goals reviewed with patient? Yes  SHORT TERM GOALS: Target date: 10 sessions  Updated: 10/10/2023 With maximal assistance, pt will select written word to express request for food/drink item in 7 out of 10 opportunities.  Baseline: Goal status: INITIAL: MET  2.  With maximal multimodal assistance, pt will answer yes/no questions in 8 out of 10 opportunities.  Baseline:  Goal status: INITIAL: Is able to answer using written language when choosing between written yes/no  3.  With maximal multimodal assistance, pt will verbally approximate common object names in 5 out of 10 opportunities.  Baseline:  Goal status: INITIAL: progress made   LONG TERM GOALS: Target date: 11/20/2023  Updated 10/10/2023 Pt will use  multimodal communication and maximal assistance to communicate basic wants and needs in 2 out of 5 opportunities per pt and family report.  Baseline:  Goal status: INITIAL: progress  2.  With Maximal A, patient/family will demonstrate understanding of the following concepts: aphasia, spontaneous recovery, communication vs conversation, strengths/strategies to promote success, local resources by answering multiple choice questions with 50% accuracy when provided supported conversation in order to increase patient and caregiver's participation in medical care.  Baseline:  Goal status: INITIAL: progress   ASSESSMENT:  CLINICAL IMPRESSION: Patient is a 73 y.o. right handed male who was seen today for a speech language treatment d/t left MCA CVA.   Pt presents with the appearance of global aphasia - potentially Wernicke's Aphasia. See below for details.  Given the severity of pt's aphasia, he is not able to communicate any basic wants/needs which (per his family's report) results in increased pt frustration and agitation.   Verbal Communication Pt's verbal communication was fluent but nonsensical speech consisting of normal rate and rhythm but didn't convey meaning. Pt was not aware of unintelligible nature of speech with few rote utterances that were intelligible. As such, pt was not able to name any basic common objects or produce rote utterances such as DOW, MOY (with written cues) or sing Happy Birthday. No ability to repeat basic words observed.   Auditory Comprehension Pt with moderately impaired auditory comprehension - given obejct name pt was able to select appropriate object in field of 2 in 4 out of 6 opportunities decreasing to 3 out of 8 opportunities with a field of 3, answered basic yes/no questions related to himself correctly in 4 out of 6 opportunities and followed basic 1-step directions related to self in 2 out of 4 opportunities.   Written Language During an unsuccessful  attempt at verbal communication, he took this writer's pencil and attempted to write x 1 but was not able to write more than two letters ET but was able to write his first name.   Reading Comprehension Pt was able to pair written word with correct object in field of 3 with 100% accuracy (15 out of 15 opportunities).   See the above treatment note for details.   OBJECTIVE IMPAIRMENTS include expressive language, receptive language, aphasia, apraxia, and written language. These impairments are limiting patient from managing medications, managing appointments, managing finances, household responsibilities, ADLs/IADLs, and effectively communicating at home and in community. Factors affecting potential to achieve goals and functional outcome are severity of impairments, family/community support, and significantly reduced health literacy. Patient will benefit from skilled SLP services to address above impairments and improve overall function.  REHAB POTENTIAL: Good  PLAN: SLP FREQUENCY: 1-2x/week  SLP DURATION: 12 weeks  PLANNED INTERVENTIONS: Language facilitation, Cueing hierachy, Internal/external aids, Functional tasks, Multimodal communication approach, SLP instruction and feedback, Compensatory strategies, and  Patient/family education   Hassell Roys, SLP Student Clinician  Happi B. Rubbie, M.S., CCC-SLP, Tree surgeon Certified Brain Injury Specialist [employer]  [facility] Services Office [phone] Ascom [phone] Fax [phone]

## 2023-11-15 ENCOUNTER — Ambulatory Visit: Admitting: Speech Pathology

## 2023-11-15 ENCOUNTER — Ambulatory Visit: Admitting: Physical Therapy

## 2023-11-15 ENCOUNTER — Encounter: Admitting: Occupational Therapy

## 2023-11-15 ENCOUNTER — Other Ambulatory Visit: Payer: Self-pay

## 2023-11-15 DIAGNOSIS — I2510 Atherosclerotic heart disease of native coronary artery without angina pectoris: Secondary | ICD-10-CM

## 2023-11-15 DIAGNOSIS — I700 Atherosclerosis of aorta: Secondary | ICD-10-CM

## 2023-11-15 DIAGNOSIS — I10 Essential (primary) hypertension: Secondary | ICD-10-CM

## 2023-11-15 DIAGNOSIS — I739 Peripheral vascular disease, unspecified: Secondary | ICD-10-CM

## 2023-11-15 DIAGNOSIS — R4701 Aphasia: Secondary | ICD-10-CM | POA: Diagnosis not present

## 2023-11-15 DIAGNOSIS — I4892 Unspecified atrial flutter: Secondary | ICD-10-CM

## 2023-11-15 DIAGNOSIS — R482 Apraxia: Secondary | ICD-10-CM

## 2023-11-15 NOTE — Therapy (Unsigned)
 OUTPATIENT SPEECH LANGUAGE PATHOLOGY  SPEECH LANGUAGE TREATMENT NOTE and PROGRESS NOTE  Recertification    Patient Name: Gregory Lane MRN: [id number] DOB:1950/08/31, 73 y.o., male Today's Date: 11/16/2023  Speech Therapy Progress Note  Dates of Reporting Period: 10/10/2023 to 11/15/2023  Objective: Patient has been seen for 10 speech therapy sessions this reporting period targeting Wernicke's Aphasia. Patient is making progress toward LTGs and met 3 STGs this reporting period. See skilled intervention, clinical impressions, and goals below for details.   PCP: Denyse DELENA Bathe, MD REFERRING PROVIDER: Arland Earnie Pouch, MD    End of Session - 11/15/23 1451     Visit Number 20    Number of Visits 44    Date for Recertification  02/07/24    Authorization Type [employer] [place]    Progress Note Due on Visit 20    SLP Start Time 1400    SLP Stop Time  1445    SLP Time Calculation (min) 45 min    Activity Tolerance Patient tolerated treatment well          Past Medical History:  Diagnosis Date   Anxiety    Aortic atherosclerosis 02/16/2017   Arthritis    Blind left eye    COPD (chronic obstructive pulmonary disease) (HCC)    Depression    Dysrhythmia    A-fib   GERD (gastroesophageal reflux disease)    Hypertension    Low TSH level 02/17/2017   Myocardial infarction [facility])    PAD (peripheral artery disease)    Paroxysmal atrial fibrillation (HCC)    Perforated duodenal ulcer (HCC)    Stroke (HCC)    left side is weak   Past Surgical History:  Procedure Laterality Date   BRONCHIAL BIOPSY  03/22/2022   Procedure: BRONCHIAL BIOPSIES;  Surgeon: Isadora Hose, MD;  Location: [facility] ENDOSCOPY;  Service: Pulmonary;;   BRONCHIAL NEEDLE ASPIRATION BIOPSY  03/22/2022   Procedure: BRONCHIAL NEEDLE ASPIRATION BIOPSIES;  Surgeon: Isadora Hose, MD;  Location: [facility] ENDOSCOPY;  Service: Pulmonary;;   ENDOBRONCHIAL ULTRASOUND  03/22/2022   Procedure: ENDOBRONCHIAL ULTRASOUND;   Surgeon: Isadora Hose, MD;  Location: [facility] ENDOSCOPY;  Service: Pulmonary;;   EXPLORATORY LAPAROTOMY  05/12/2015   EYE SURGERY     FRACTURE SURGERY     LAPAROTOMY N/A 05/10/2015   Procedure: EXPLORATORY LAPAROTOMY;  Surgeon: Lynda Leos, MD;  Location: [facility] OR;  Service: General;  Laterality: N/A;   LEFT HEART CATH AND CORONARY ANGIOGRAPHY N/A 05/02/2016   Procedure: Left Heart Cath and Coronary Angiography;  Surgeon: Gordy Bergamo, MD;  Location: [facility] INVASIVE CV LAB;  Service: Cardiovascular;  Laterality: N/A;   LOWER EXTREMITY ANGIOGRAPHY Left 09/21/2021   Procedure: Lower Extremity Angiography;  Surgeon: Jama Cordella KANDICE, MD;  Location: [facility] INVASIVE CV LAB;  Service: Cardiovascular;  Laterality: Left;   Patient Active Problem List   Diagnosis Date Noted   Allergic rhinitis due to pollen 12/11/2022   Tinea cruris 12/11/2022   BPH associated with nocturia 12/11/2022   Chest pain, non-cardiac 04/11/2022   Adenocarcinoma of lower lobe of right lung (HCC) 03/28/2022   Pulmonary nodule 1 cm or greater in diameter 03/22/2022   Leg weakness, bilateral 02/09/2022   Osteopenia 10/13/2021   PAD (peripheral artery disease) 09/21/2021   GERD without esophagitis 09/21/2021   Dyslipidemia 09/21/2021   Depression 09/21/2021   Atrial flutter (HCC) 09/21/2021   Chronic obstructive pulmonary disease (COPD) (HCC) 09/21/2021   Coronary artery disease 09/21/2021   Cerebrovascular accident (CVA) due to embolism  of cerebral artery (HCC) 09/06/2021   Bilateral edema of lower extremity 08/05/2020   Vasovagal syncope 08/02/2020   Annual physical exam 12/26/2019   Hyperlipidemia 12/26/2019   Abrasion of right hand 12/26/2019   Metabolic syndrome 11/11/2019   Low TSH level 02/17/2017   Atrial flutter with rapid ventricular response (HCC) 02/16/2017   Cigarette smoker 02/16/2017   COPD with acute exacerbation (HCC) 02/16/2017   Elevated lactic acid level 02/16/2017   Aortic atherosclerosis 02/16/2017    Productive cough    Hypoxia 05/24/2015   SOB (shortness of breath) 05/24/2015   COPD suggested by initial evaluation 05/17/2015   Essential hypertension 05/17/2015   Smoking 05/17/2015    ONSET DATE: 08/09/2023   REFERRING DIAG: I63.9 (ICD-10-CM) - Cerebral infarction, unspecified   THERAPY DIAG:  Aphasia  Apraxia  Rationale for Evaluation and Treatment Rehabilitation  SUBJECTIVE:   PERTINENT HISTORY and DIAGNOSTIC FINDINGS: Pt is a 73 year old right handed male who [facility] ED on 08/09/2023 with stroke-like symptoms - slurred speech and difficulty answering questions and weakness to his right side extremities. CT head and subsequent CTA head and neck shows left M2 occlusion with large penumbra in the region. Pt transferred to [facility] for mechanical thrombectomy on 08/10/2023. Pt admitted at [facility] from 08/10/2023 thru 08/21/2023 with recommended for home health.   MRI brain: Remonstrated left MCA territory infarct primarily involving the parietal lobe.   Additional medical history includes: blindness left eye, A-fib on warfarin, COPD, HFpEF, HTN, current smoker, ethanol abuse, RLL adenocarcinoma s/p SBRT (2024)     PAIN:  Are you having pain? Unable to communicate  FALLS: Has patient fallen in last 6 months?  No  LIVING ENVIRONMENT: Lives with: sister-in-law Lives in: House/apartment  PLOF:  Level of assistance: Independent with ADLs, Independent with IADLs, Comment: poor health literacy, difficulty with reading and writing per family report Employment: Retired   PATIENT GOALS    per pt's sister-in-law, she hopes he will talk again  SUBJECTIVE STATEMENT: Pt pleasant, engaged, attended session with sister-in-law Pt accompanied by: sister-in-law  OBJECTIVE:   TODAY'S TREATMENT:  Skilled ST treatment session targeted pt's cognitive communication, aphasia and apraxia of speech goals. SLP facilitated session by providing the following interventions:  To increase  comprehension skills, pictures of days of the week paired with their words were presented in the following:  SLP verbally presented each picture and written word. Pt then said each word with the clinician. The last round consisted of pt saying word by self.  Days of the week: when verbalized with SLP: achieved 5 out of 7 trials independently, improved to 6 out of 7 with mod A; when verbalized by self: achieved 3 out of 7 trials independently, improved to 4 out of 7 when when given max A. When errors were made, pronunciation was closer and contained more appropriate phonemes.    Pt asked to point to three out of four personal objects by name (point to water, suspenders, shoes). Pt independently achieved 5 out of 8 trials, improved to 8 out of 8 trials when given mod to max A.   SLP introduced everyday picture cards using the Following Directions Hierarchy. Pt was given 3 picture cards and asked to point to one object by function (point to the one you drink from). Pt independently achieved 6 out of 10 trials, improved to 10 out of 10 with min to max A. SLP further facilitated activity by having pt point, in sequence, to 2 common objects  by function out of 3 choices. Pt independently achieved 3 out of 5 trials. Improved to 4 out of 5 trials with max A.   SLP asked pt to point to common body parts to target auditory comprehension. Pt independently pointed to 11 out of 15 body parts. Improved to 15 out of 15 with minimum to maximum A.    SLP further facilitated session with: Matching Words to Definitions pp 20. Pt independently matched words to their definitions in 10 out of 10 trials. Pt with increased response times.  Matching Across Columns pp 11. Pt independently matched words to their category in 14 out of 15 trials. Improved to 15 out of 15 with mod A.   PATIENT EDUCATION: Education details: see above Person educated: Patient and his sister-in-law Education method: Explanation Education  comprehension: needs further education  HOME EXERCISE PROGRAM:   Supplement verbal information with single written words  GOALS:  Goals reviewed with patient? Yes  SHORT TERM GOALS: Target date: 10 sessions  Updated: 11/15/2023 Updated: 10/10/2023 With maximal assistance, pt will select written word to express request for food/drink item in 7 out of 10 opportunities.  Baseline: Goal status: INITIAL: MET  2.  With maximal multimodal assistance, pt will answer yes/no questions in 8 out of 10 opportunities.  Baseline:  Goal status: INITIAL: Is able to answer using written language when choosing between written yes/no: MET  3.  With maximal multimodal assistance, pt will verbally approximate common object names in 5 out of 10 opportunities.  Baseline:  Goal status: INITIAL: progress made: MET  4. With minimal assistance, pt will point to 1 common object by function in a field of 3 with 90% accuracy.  Baseline:  Goal status: INITIAL  5. With minimal to moderate assistance, pt will point, in sequence to 2 common objects by function in field of 3 with 90% accuracy.   Baseline:  Goal status: INITIAL  6. With minimal to moderate assistance pt with verablly approximate names of basic functional objects with > 75% speech intelligiblity at the word level.    Baseline:  Goal status: INITIAL  7. With minimal assistance, pt will identify body parts with 90% accuracy.   Baseline:  Goal status: INITIAL  8. With moderate assistance, pt will follow 1 step directions with 75% accuracy. Baseline:  Goal status: INITIAL  LONG TERM GOALS: Target date: 11/20/2023  Updated: 11/15/2023 Updated: 10/10/2023 Pt will use multimodal communication and maximal assistance to communicate basic wants and needs in 2 out of 5 opportunities per pt and family report.  Baseline:  Goal status: INITIAL: progress, continue targeting  2.  With Maximal A, patient/family will demonstrate understanding of the  following concepts: aphasia, spontaneous recovery, communication vs conversation, strengths/strategies to promote success, local resources by answering multiple choice questions with 50% accuracy when provided supported conversation in order to increase patient and caregiver's participation in medical care.  Baseline:  Goal status: INITIAL: progress, continue targeting   ASSESSMENT:  CLINICAL IMPRESSION: Patient is a 73 y.o. right handed male who was seen today for a speech language treatment d/t left MCA CVA.   Pt presents with the appearance of global aphasia - potentially Wernicke's Aphasia. See below for details.  Given the severity of pt's aphasia, he is not able to communicate any basic wants/needs which (per his family's report) results in increased pt frustration and agitation.   Verbal Communication Pt's verbal communication was fluent but nonsensical speech consisting of normal rate and rhythm but didn't convey  meaning. Pt was not aware of unintelligible nature of speech with few rote utterances that were intelligible. As such, pt was not able to name any basic common objects or produce rote utterances such as DOW, MOY (with written cues) or sing Happy Birthday. No ability to repeat basic words observed.   Auditory Comprehension Pt with moderately impaired auditory comprehension - given obejct name pt was able to select appropriate object in field of 2 in 4 out of 6 opportunities decreasing to 3 out of 8 opportunities with a field of 3, answered basic yes/no questions related to himself correctly in 4 out of 6 opportunities and followed basic 1-step directions related to self in 2 out of 4 opportunities.   Written Language During an unsuccessful attempt at verbal communication, he took this writer's pencil and attempted to write x 1 but was not able to write more than two letters ET but was able to write his first name.   Reading Comprehension Pt was able to pair written word with  correct object in field of 3 with 100% accuracy (15 out of 15 opportunities).   See the above treatment note for details.   OBJECTIVE IMPAIRMENTS include expressive language, receptive language, aphasia, apraxia, and written language. These impairments are limiting patient from managing medications, managing appointments, managing finances, household responsibilities, ADLs/IADLs, and effectively communicating at home and in community. Factors affecting potential to achieve goals and functional outcome are severity of impairments, family/community support, and significantly reduced health literacy. Patient will benefit from skilled SLP services to address above impairments and improve overall function.  REHAB POTENTIAL: Good  PLAN: SLP FREQUENCY: 1-2x/week  SLP DURATION: 12 weeks  PLANNED INTERVENTIONS: Language facilitation, Cueing hierachy, Internal/external aids, Functional tasks, Multimodal communication approach, SLP instruction and feedback, Compensatory strategies, and Patient/family education   Hassell Roys, SLP Student Clinician  Happi B. Rubbie, M.S., CCC-SLP, Tree surgeon Certified Brain Injury Specialist [facility]  [facility] Office [phone] Ascom [phone] Fax [phone]

## 2023-11-16 MED ORDER — LOSARTAN POTASSIUM 50 MG PO TABS: 50.0000 mg | ORAL_TABLET | Freq: Two times a day (BID) | ORAL | 11 refills | Status: DC

## 2023-11-20 ENCOUNTER — Encounter: Admitting: Occupational Therapy

## 2023-11-20 ENCOUNTER — Ambulatory Visit: Admitting: Speech Pathology

## 2023-11-20 ENCOUNTER — Ambulatory Visit: Admitting: Physical Therapy

## 2023-11-20 DIAGNOSIS — R482 Apraxia: Secondary | ICD-10-CM

## 2023-11-20 DIAGNOSIS — R4701 Aphasia: Secondary | ICD-10-CM | POA: Diagnosis not present

## 2023-11-20 NOTE — Therapy (Signed)
 OUTPATIENT SPEECH LANGUAGE PATHOLOGY  SPEECH LANGUAGE TREATMENT NOTE   Patient Name: Gregory Lane MRN: [id number] DOB:[birth year], 73 y.o., male 12 Date: 11/20/2023   PCP: Denyse DELENA Bathe, MD REFERRING PROVIDER: Arland Earnie Pouch, MD    End of Session - 11/20/23 1442     Visit Number 21    Number of Visits 44    Date for Recertification  02/07/24    Authorization Type [employer] [place]    Progress Note Due on Visit 30    SLP Start Time 1445    SLP Stop Time  1530    SLP Time Calculation (min) 45 min    Activity Tolerance Patient tolerated treatment well          Past Medical History:  Diagnosis Date   Anxiety    Aortic atherosclerosis 02/16/2017   Arthritis    Blind left eye    COPD (chronic obstructive pulmonary disease) (HCC)    Depression    Dysrhythmia    A-fib   GERD (gastroesophageal reflux disease)    Hypertension    Low TSH level 02/17/2017   Myocardial infarction [facility])    PAD (peripheral artery disease)    Paroxysmal atrial fibrillation (HCC)    Perforated duodenal ulcer (HCC)    Stroke (HCC)    left side is weak   Past Surgical History:  Procedure Laterality Date   BRONCHIAL BIOPSY  03/22/2022   Procedure: BRONCHIAL BIOPSIES;  Surgeon: Isadora Hose, MD;  Location: [facility] ENDOSCOPY;  Service: Pulmonary;;   BRONCHIAL NEEDLE ASPIRATION BIOPSY  03/22/2022   Procedure: BRONCHIAL NEEDLE ASPIRATION BIOPSIES;  Surgeon: Isadora Hose, MD;  Location: [facility];  Service: Pulmonary;;   ENDOBRONCHIAL ULTRASOUND  03/22/2022   Procedure: ENDOBRONCHIAL ULTRASOUND;  Surgeon: Isadora Hose, MD;  Location: [facility]OS[facility];  Service: Pulmonary;;   EXPLORATORY LAPAROTOMY  05/12/2015   EYE SURGERY     FRACTURE SURGERY     LAPAROTOMY N/A 05/10/2015   Procedure: EXPLORATORY LAPAROTOMY;  Surgeon: Lynda Leos, MD;  Location: [facility] OR;  Service: General;  Laterality: N/A;   LEFT HEART CATH AND CORONARY ANGIOGRAPHY N/A 05/02/2016   Procedure: Left Heart Cath and  Coronary Angiography;  Surgeon: Gordy Bergamo, MD;  Location: [facility] INVASIVE CV LAB;  Service: Cardiovascular;  Laterality: N/A;   LOWER EXTREMITY ANGIOGRAPHY Left 09/21/2021   Procedure: Lower Extremity Angiography;  Surgeon: Jama Cordella KANDICE, MD;  Location: [facility] INVASIVE CV LAB;  Service: Cardiovascular;  Laterality: Left;   Patient Active Problem List   Diagnosis Date Noted   Allergic rhinitis due to pollen 12/11/2022   Tinea cruris 12/11/2022   BPH associated with nocturia 12/11/2022   Chest pain, non-cardiac 04/11/2022   Adenocarcinoma of lower lobe of right lung (HCC) 03/28/2022   Pulmonary nodule 1 cm or greater in diameter 03/22/2022   Leg weakness, bilateral 02/09/2022   Osteopenia 10/13/2021   PAD (peripheral artery disease) 09/21/2021   GERD without esophagitis 09/21/2021   Dyslipidemia 09/21/2021   Depression 09/21/2021   Atrial flutter (HCC) 09/21/2021   Chronic obstructive pulmonary disease (COPD) (HCC) 09/21/2021   Coronary artery disease 09/21/2021   Cerebrovascular accident (CVA) due to embolism of cerebral artery (HCC) 09/06/2021   Bilateral edema of lower extremity 08/05/2020   Vasovagal syncope 08/02/2020   Annual physical exam 12/26/2019   Hyperlipidemia 12/26/2019   Abrasion of right hand 12/26/2019   Metabolic syndrome 11/11/2019   Low TSH level 02/17/2017   Atrial flutter with rapid ventricular response (HCC) 02/16/2017   Cigarette  smoker 02/16/2017   COPD with acute exacerbation (HCC) 02/16/2017   Elevated lactic acid level 02/16/2017   Aortic atherosclerosis 02/16/2017   Productive cough    Hypoxia 05/24/2015   SOB (shortness of breath) 05/24/2015   COPD suggested by initial evaluation 05/17/2015   Essential hypertension 05/17/2015   Smoking 05/17/2015    ONSET DATE: 08/09/2023   REFERRING DIAG: I63.9 (ICD-10-CM) - Cerebral infarction, unspecified   THERAPY DIAG:  Aphasia  Apraxia  Rationale for Evaluation and Treatment  Rehabilitation  SUBJECTIVE:   PERTINENT HISTORY and DIAGNOSTIC FINDINGS: Pt is a 73 year old right handed male who presented to [facility] ED on 08/09/2023 with stroke-like symptoms - slurred speech and difficulty answering questions and weakness to his right side extremities. CT head and subsequent CTA head and neck shows left M2 occlusion with large penumbra in the region. Pt transferred to [facility] for mechanical thrombectomy on 08/10/2023. Pt admitted at [facility] from 08/10/2023 thru 08/21/2023 with recommended for home health.   MRI brain: Remonstrated left MCA territory infarct primarily involving the parietal lobe.   Additional medical history includes: blindness left eye, A-fib on warfarin, COPD, HFpEF, HTN, current smoker, ethanol abuse, RLL adenocarcinoma s/p SBRT (2024)    PAIN:  Are you having pain? Unable to communicate  FALLS: Has patient fallen in last 6 months?  No  LIVING ENVIRONMENT: Lives with: sister-in-law Lives in: House/apartment  PLOF:  Level of assistance: Independent with ADLs, Independent with IADLs, Comment: poor health literacy, difficulty with reading and writing per family report Employment: Retired   PATIENT GOALS    per pt's sister-in-law, she hopes he will talk again  SUBJECTIVE STATEMENT: Pt pleasant, engaged, attended session with sister-in-law Pt accompanied by: sister-in-law  OBJECTIVE:   TODAY'S TREATMENT:  Skilled ST treatment session targeted pt's cognitive communication, aphasia and apraxia of speech goals. SLP facilitated session by providing the following interventions:  SLP reviewed Selecting Category Members pp 20 with pt. Pt with increased speech intelligibility with 9 intelligible words.   To increase comprehension skills, pictures of everyday items paired with their words were presented in the following:  SLP verbally presented each picture and written word. Pt then said each word with the clinician. The last round consisted of pt saying word  by self.  Everyday items: when verbalized with SLP: achieved 5 out of 11 trials independently, improved to 6 out of 11 with min to max A; when verbalized by self: pt with neologisms and paraphasias - unable to produce 4 consecutive words with max A  Following Directions Hierarchy Stage 3 - Given 3 pictures of everyday items, pt asked to point to 2 common objects by name in sequence. Pt independently achieved 8 out of 10 trials, improved to 10 out of 10 trials with min A. Pt with increased self-monitoring.  Stage 4 - Given 3 pictures of everyday items, pt asked to point to one object spelled (I.e point to p-e-n). Pt independently pointed to 6 out of 10 trials. Improved to 10 out of 10 trials with min to mod A.   SLP asked pt to point to common body parts to target auditory comprehension. Pt independently pointed to 11 out of 12 body parts. Improved to 12 out of 12 with minimum A. SLP increased task by asking pt to point to right or left body parts (point to right ear). Pt independently pointed 8 out of 8 body parts, self correcting as needed.   SLP further facilitated session with: Pictures of Category Items  pp 20. Pt independently marked pictures that belonged to a given category in 16 out of 16 trials.  Sorting-Four Category Choice pp 17. Pt independently matched provided words to their category in 20 out of 20 trials.  Pt with increased response times.   PATIENT EDUCATION: Education details: see above Person educated: Patient and his sister-in-law Education method: Explanation Education comprehension: needs further education  HOME EXERCISE PROGRAM:   Supplement verbal information with single written words  GOALS:  Goals reviewed with patient? Yes  SHORT TERM GOALS: Target date: 10 sessions  Updated: 11/15/2023 Updated: 10/10/2023 With maximal assistance, pt will select written word to express request for food/drink item in 7 out of 10 opportunities.  Baseline: Goal status: INITIAL:  MET  2.  With maximal multimodal assistance, pt will answer yes/no questions in 8 out of 10 opportunities.  Baseline:  Goal status: INITIAL: Is able to answer using written language when choosing between written yes/no: MET  3.  With maximal multimodal assistance, pt will verbally approximate common object names in 5 out of 10 opportunities.  Baseline:  Goal status: INITIAL: progress made: MET  4. With minimal assistance, pt will point to 1 common object by function in a field of 3 with 90% accuracy.  Baseline:  Goal status: INITIAL  5. With minimal to moderate assistance, pt will point, in sequence to 2 common objects by function in field of 3 with 90% accuracy.   Baseline:  Goal status: INITIAL  6. With minimal to moderate assistance pt with verablly approximate names of basic functional objects with > 75% speech intelligiblity at the word level.    Baseline:  Goal status: INITIAL  7. With minimal assistance, pt will identify body parts with 90% accuracy.   Baseline:  Goal status: INITIAL  8. With moderate assistance, pt will follow 1 step directions with 75% accuracy. Baseline:  Goal status: INITIAL  LONG TERM GOALS: Target date: 11/20/2023  Updated: 11/15/2023 Updated: 10/10/2023 Pt will use multimodal communication and maximal assistance to communicate basic wants and needs in 2 out of 5 opportunities per pt and family report.  Baseline:  Goal status: INITIAL: progress, continue targeting  2.  With Maximal A, patient/family will demonstrate understanding of the following concepts: aphasia, spontaneous recovery, communication vs conversation, strengths/strategies to promote success, local resources by answering multiple choice questions with 50% accuracy when provided supported conversation in order to increase patient and caregiver's participation in medical care.  Baseline:  Goal status: INITIAL: progress, continue targeting   ASSESSMENT:  CLINICAL  IMPRESSION: Patient is a 73 y.o. right handed male who was seen today for a speech language treatment d/t left MCA CVA.   Pt presents with the appearance of global aphasia - potentially Wernicke's Aphasia. See below for details.  Given the severity of pt's aphasia, he is not able to communicate any basic wants/needs which (per his family's report) results in increased pt frustration and agitation.   Verbal Communication Pt's verbal communication was fluent but nonsensical speech consisting of normal rate and rhythm but didn't convey meaning. Pt was not aware of unintelligible nature of speech with few rote utterances that were intelligible. As such, pt was not able to name any basic common objects or produce rote utterances such as DOW, MOY (with written cues) or sing Happy Birthday. No ability to repeat basic words observed.   Auditory Comprehension Pt with moderately impaired auditory comprehension - given obejct name pt was able to select appropriate object in field of 2  in 4 out of 6 opportunities decreasing to 3 out of 8 opportunities with a field of 3, answered basic yes/no questions related to himself correctly in 4 out of 6 opportunities and followed basic 1-step directions related to self in 2 out of 4 opportunities.   Written Language During an unsuccessful attempt at verbal communication, he took this writer's pencil and attempted to write x 1 but was not able to write more than two letters ET but was able to write his first name.   Reading Comprehension Pt was able to pair written word with correct object in field of 3 with 100% accuracy (15 out of 15 opportunities).   See the above treatment note for details.   OBJECTIVE IMPAIRMENTS include expressive language, receptive language, aphasia, apraxia, and written language. These impairments are limiting patient from managing medications, managing appointments, managing finances, household responsibilities, ADLs/IADLs, and effectively  communicating at home and in community. Factors affecting potential to achieve goals and functional outcome are severity of impairments, family/community support, and significantly reduced health literacy. Patient will benefit from skilled SLP services to address above impairments and improve overall function.  REHAB POTENTIAL: Good  PLAN: SLP FREQUENCY: 1-2x/week  SLP DURATION: 12 weeks  PLANNED INTERVENTIONS: Language facilitation, Cueing hierachy, Internal/external aids, Functional tasks, Multimodal communication approach, SLP instruction and feedback, Compensatory strategies, and Patient/family education   Hassell Roys, SLP Student Clinician  Happi B. Rubbie, M.S., CCC-SLP, Tree surgeon Certified Brain Injury Specialist [facility]  [facility] Rehabilitation Services Office [phone] Ascom [phone] Fax [phone]

## 2023-11-22 ENCOUNTER — Encounter: Admitting: Occupational Therapy

## 2023-11-22 ENCOUNTER — Ambulatory Visit: Admitting: Physical Therapy

## 2023-11-22 ENCOUNTER — Ambulatory Visit: Attending: Internal Medicine | Admitting: Speech Pathology

## 2023-11-22 DIAGNOSIS — R4701 Aphasia: Secondary | ICD-10-CM | POA: Diagnosis present

## 2023-11-22 DIAGNOSIS — R482 Apraxia: Secondary | ICD-10-CM | POA: Diagnosis present

## 2023-11-22 NOTE — Therapy (Signed)
 OUTPATIENT SPEECH LANGUAGE PATHOLOGY  SPEECH LANGUAGE TREATMENT NOTE   Patient Name: Gregory Lane MRN: [id number] DOB:05-26-1950, 73 y.o., male 54 Date: 11/22/2023   PCP: Denyse DELENA Bathe, MD REFERRING PROVIDER: Arland Earnie Pouch, MD    End of Session - 11/22/23 1142     Visit Number 22    Number of Visits 44    Date for Recertification  02/07/24    Authorization Type [employer] [place]    Progress Note Due on Visit 30    SLP Start Time 1145    SLP Stop Time  1230    SLP Time Calculation (min) 45 min    Activity Tolerance Patient tolerated treatment well          Past Medical History:  Diagnosis Date   Anxiety    Aortic atherosclerosis 02/16/2017   Arthritis    Blind left eye    COPD (chronic obstructive pulmonary disease) (HCC)    Depression    Dysrhythmia    A-fib   GERD (gastroesophageal reflux disease)    Hypertension    Low TSH level 02/17/2017   Myocardial infarction [facility])    PAD (peripheral artery disease)    Paroxysmal atrial fibrillation (HCC)    Perforated duodenal ulcer (HCC)    Stroke (HCC)    left side is weak   Past Surgical History:  Procedure Laterality Date   BRONCHIAL BIOPSY  03/22/2022   Procedure: BRONCHIAL BIOPSIES;  Surgeon: Isadora Hose, MD;  Location: [facility] ENDOSCOPY;  Service: Pulmonary;;   BRONCHIAL NEEDLE ASPIRATION BIOPSY  03/22/2022   Procedure: BRONCHIAL NEEDLE ASPIRATION BIOPSIES;  Surgeon: Isadora Hose, MD;  Location: [facility] ENDOSCOPY;  Service: Pulmonary;;   ENDOBRONCHIAL ULTRASOUND  03/22/2022   Procedure: ENDOBRONCHIAL ULTRASOUND;  Surgeon: Isadora Hose, MD;  Location: [facility] ENDOSCOPY;  Service: Pulmonary;;   EXPLORATORY LAPAROTOMY  05/12/2015   EYE SURGERY     FRACTURE SURGERY     LAPAROTOMY N/A 05/10/2015   Procedure: EXPLORATORY LAPAROTOMY;  Surgeon: Lynda Leos, MD;  Location: [facility] OR;  Service: General;  Laterality: N/A;   LEFT HEART CATH AND CORONARY ANGIOGRAPHY N/A 05/02/2016   Procedure: Left Heart Cath and  Coronary Angiography;  Surgeon: Gordy Bergamo, MD;  Location: [facility] INVASIVE CV LAB;  Service: Cardiovascular;  Laterality: N/A;   LOWER EXTREMITY ANGIOGRAPHY Left 09/21/2021   Procedure: Lower Extremity Angiography;  Surgeon: Jama Cordella KANDICE, MD;  Location: [facility] INVASIVE CV LAB;  Service: Cardiovascular;  Laterality: Left;   Patient Active Problem List   Diagnosis Date Noted   Allergic rhinitis due to pollen 12/11/2022   Tinea cruris 12/11/2022   BPH associated with nocturia 12/11/2022   Chest pain, non-cardiac 04/11/2022   Adenocarcinoma of lower lobe of right lung (HCC) 03/28/2022   Pulmonary nodule 1 cm or greater in diameter 03/22/2022   Leg weakness, bilateral 02/09/2022   Osteopenia 10/13/2021   PAD (peripheral artery disease) 09/21/2021   GERD without esophagitis 09/21/2021   Dyslipidemia 09/21/2021   Depression 09/21/2021   Atrial flutter (HCC) 09/21/2021   Chronic obstructive pulmonary disease (COPD) (HCC) 09/21/2021   Coronary artery disease 09/21/2021   Cerebrovascular accident (CVA) due to embolism of cerebral artery (HCC) 09/06/2021   Bilateral edema of lower extremity 08/05/2020   Vasovagal syncope 08/02/2020   Annual physical exam 12/26/2019   Hyperlipidemia 12/26/2019   Abrasion of right hand 12/26/2019   Metabolic syndrome 11/11/2019   Low TSH level 02/17/2017   Atrial flutter with rapid ventricular response (HCC) 02/16/2017   Cigarette  smoker 02/16/2017   COPD with acute exacerbation (HCC) 02/16/2017   Elevated lactic acid level 02/16/2017   Aortic atherosclerosis 02/16/2017   Productive cough    Hypoxia 05/24/2015   SOB (shortness of breath) 05/24/2015   COPD suggested by initial evaluation 05/17/2015   Essential hypertension 05/17/2015   Smoking 05/17/2015    ONSET DATE: 08/09/2023   REFERRING DIAG: I63.9 (ICD-10-CM) - Cerebral infarction, unspecified   THERAPY DIAG:  Aphasia  Rationale for Evaluation and Treatment Rehabilitation  SUBJECTIVE:    PERTINENT HISTORY and DIAGNOSTIC FINDINGS: Pt is a 73 year old right handed male who presented to [facility] ED on 08/09/2023 with stroke-like symptoms - slurred speech and difficulty answering questions and weakness to his right side extremities. CT head and subsequent CTA head and neck shows left M2 occlusion with large penumbra in the region. Pt transferred to [facility] for mechanical thrombectomy on 08/10/2023. Pt admitted at [facility] from 08/10/2023 thru 08/21/2023 with recommended for home health.   MRI brain: Remonstrated left MCA territory infarct primarily involving the parietal lobe.   Additional medical history includes: blindness left eye, A-fib on warfarin, COPD, HFpEF, HTN, current smoker, ethanol abuse, RLL adenocarcinoma s/p SBRT (2024)    PAIN:  Are you having pain? Unable to communicate  FALLS: Has patient fallen in last 6 months?  No  LIVING ENVIRONMENT: Lives with: sister-in-law Lives in: House/apartment  PLOF:  Level of assistance: Independent with ADLs, Independent with IADLs, Comment: poor health literacy, difficulty with reading and writing per family report Employment: Retired   PATIENT GOALS    per pt's sister-in-law, she hopes he will talk again  SUBJECTIVE STATEMENT: Pt pleasant, engaged, attended session with sister-in-law Pt accompanied by: sister-in-law  OBJECTIVE:   TODAY'S TREATMENT:  Skilled ST treatment session targeted pt's cognitive communication, aphasia and apraxia of speech goals. SLP facilitated session by providing the following interventions:   To increase comprehension skills, pictures of everyday items paired with their words were presented in the following:  SLP verbally presented each picture and written word. Pt then said each word with the clinician. The last round consisted of pt saying word by self.  Everyday items: when verbalized with SLP: achieved 7 out of 11 trials independently, improved to 8 out of 11 with min to max A; when verbalized  by self: pt with some portion of words intelligible but overall word was not intelligible (some glimpse of correct phonemes as compared to previous ability)  Following Directions Hierarchy Stage 3 - Given 3 pictures of everyday items, pt asked to point to 2 common objects by name in sequence. Pt independently achieved 9 out of 10 trials, improved to 10 out of 10 trials with extended time and rare Min A. Pt with increased self-monitoring.  Stage 4 - Given 3 pictures of everyday items, pt asked to point to one object spelled (I.e point to p-e-n). Pt independently pointed to 6 out of 10 trials. Improved to 10 out of 10 trials with min to mod A.   SLP asked pt to point to common body parts to target auditory comprehension. Pt independently pointed to 10 out of 12 body parts. Improved to 12 out of 12 with minimum A. SLP increased task by asking pt to point to right or left body parts (point to right ear). Pt independently pointed 6 out of 8 body parts, self correcting with wait time.    PATIENT EDUCATION: Education details: see above Person educated: Patient and his sister-in-law Education method: Hospital doctor  comprehension: needs further education  HOME EXERCISE PROGRAM:   Supplement verbal information with single written words  GOALS:  Goals reviewed with patient? Yes  SHORT TERM GOALS: Target date: 10 sessions  Updated: 11/15/2023 Updated: 10/10/2023 With maximal assistance, pt will select written word to express request for food/drink item in 7 out of 10 opportunities.  Baseline: Goal status: INITIAL: MET  2.  With maximal multimodal assistance, pt will answer yes/no questions in 8 out of 10 opportunities.  Baseline:  Goal status: INITIAL: Is able to answer using written language when choosing between written yes/no: MET  3.  With maximal multimodal assistance, pt will verbally approximate common object names in 5 out of 10 opportunities.  Baseline:  Goal status: INITIAL:  progress made: MET  4. With minimal assistance, pt will point to 1 common object by function in a field of 3 with 90% accuracy.  Baseline:  Goal status: INITIAL  5. With minimal to moderate assistance, pt will point, in sequence to 2 common objects by function in field of 3 with 90% accuracy.   Baseline:  Goal status: INITIAL  6. With minimal to moderate assistance pt with verablly approximate names of basic functional objects with > 75% speech intelligiblity at the word level.    Baseline:  Goal status: INITIAL  7. With minimal assistance, pt will identify body parts with 90% accuracy.   Baseline:  Goal status: INITIAL  8. With moderate assistance, pt will follow 1 step directions with 75% accuracy. Baseline:  Goal status: INITIAL  LONG TERM GOALS: Target date: 11/20/2023  Updated: 11/15/2023 Updated: 10/10/2023 Pt will use multimodal communication and maximal assistance to communicate basic wants and needs in 2 out of 5 opportunities per pt and family report.  Baseline:  Goal status: INITIAL: progress, continue targeting  2.  With Maximal A, patient/family will demonstrate understanding of the following concepts: aphasia, spontaneous recovery, communication vs conversation, strengths/strategies to promote success, local resources by answering multiple choice questions with 50% accuracy when provided supported conversation in order to increase patient and caregiver's participation in medical care.  Baseline:  Goal status: INITIAL: progress, continue targeting   ASSESSMENT:  CLINICAL IMPRESSION: Patient is a 73 y.o. right handed male who was seen today for a speech language treatment d/t left MCA CVA.   Pt presents with the appearance of global aphasia - potentially Wernicke's Aphasia. See below for details.  Given the severity of pt's aphasia, he is not able to communicate any basic wants/needs which (per his family's report) results in increased pt frustration and agitation.    Verbal Communication Pt's verbal communication was fluent but nonsensical speech consisting of normal rate and rhythm but didn't convey meaning. Pt was not aware of unintelligible nature of speech with few rote utterances that were intelligible. As such, pt was not able to name any basic common objects or produce rote utterances such as DOW, MOY (with written cues) or sing Happy Birthday. No ability to repeat basic words observed.   Auditory Comprehension Pt with moderately impaired auditory comprehension - given obejct name pt was able to select appropriate object in field of 2 in 4 out of 6 opportunities decreasing to 3 out of 8 opportunities with a field of 3, answered basic yes/no questions related to himself correctly in 4 out of 6 opportunities and followed basic 1-step directions related to self in 2 out of 4 opportunities.   Written Language During an unsuccessful attempt at verbal communication, he took this writer's pencil  and attempted to write x 1 but was not able to write more than two letters ET but was able to write his first name.   Reading Comprehension Pt was able to pair written word with correct object in field of 3 with 100% accuracy (15 out of 15 opportunities).   See the above treatment note for details.   OBJECTIVE IMPAIRMENTS include expressive language, receptive language, aphasia, apraxia, and written language. These impairments are limiting patient from managing medications, managing appointments, managing finances, household responsibilities, ADLs/IADLs, and effectively communicating at home and in community. Factors affecting potential to achieve goals and functional outcome are severity of impairments, family/community support, and significantly reduced health literacy. Patient will benefit from skilled SLP services to address above impairments and improve overall function.  REHAB POTENTIAL: Good  PLAN: SLP FREQUENCY: 1-2x/week  SLP DURATION: 12  weeks  PLANNED INTERVENTIONS: Language facilitation, Cueing hierachy, Internal/external aids, Functional tasks, Multimodal communication approach, SLP instruction and feedback, Compensatory strategies, and Patient/family education    Miasha Emmons B. Rubbie, M.S., CCC-SLP, Tree surgeon Certified Brain Injury Specialist [facility]  [facility] Office [phone] Ascom [phone] Fax [phone]

## 2023-11-23 ENCOUNTER — Other Ambulatory Visit: Payer: Self-pay | Admitting: Internal Medicine

## 2023-11-23 DIAGNOSIS — J301 Allergic rhinitis due to pollen: Secondary | ICD-10-CM

## 2023-11-27 ENCOUNTER — Encounter: Admitting: Occupational Therapy

## 2023-11-27 ENCOUNTER — Ambulatory Visit: Admitting: Speech Pathology

## 2023-11-27 ENCOUNTER — Ambulatory Visit: Admitting: Physical Therapy

## 2023-11-27 DIAGNOSIS — R4701 Aphasia: Secondary | ICD-10-CM | POA: Diagnosis not present

## 2023-11-27 DIAGNOSIS — R482 Apraxia: Secondary | ICD-10-CM

## 2023-11-27 NOTE — Therapy (Unsigned)
 OUTPATIENT SPEECH LANGUAGE PATHOLOGY  SPEECH LANGUAGE TREATMENT NOTE   Patient Name: Gregory Lane MRN: [id number] DOB:11/23/1950, 73 y.o., male 12 Date: 11/27/2023   PCP: Denyse DELENA Bathe, MD REFERRING PROVIDER: Arland Earnie Pouch, MD    End of Session - 11/27/23 1008     Visit Number 23    Number of Visits 44    Date for Recertification  02/07/24    Authorization Type [employer] [place]    Progress Note Due on Visit 30    SLP Start Time 1015    SLP Stop Time  1100    SLP Time Calculation (min) 45 min    Activity Tolerance Patient tolerated treatment well          Past Medical History:  Diagnosis Date   Anxiety    Aortic atherosclerosis 02/16/2017   Arthritis    Blind left eye    COPD (chronic obstructive pulmonary disease) (HCC)    Depression    Dysrhythmia    A-fib   GERD (gastroesophageal reflux disease)    Hypertension    Low TSH level 02/17/2017   Myocardial infarction [facility])    PAD (peripheral artery disease)    Paroxysmal atrial fibrillation (HCC)    Perforated duodenal ulcer (HCC)    Stroke (HCC)    left side is weak   Past Surgical History:  Procedure Laterality Date   BRONCHIAL BIOPSY  03/22/2022   Procedure: BRONCHIAL BIOPSIES;  Surgeon: Isadora Hose, MD;  Location: [facility] ENDOSCOPY;  Service: Pulmonary;;   BRONCHIAL NEEDLE ASPIRATION BIOPSY  03/22/2022   Procedure: BRONCHIAL NEEDLE ASPIRATION BIOPSIES;  Surgeon: Isadora Hose, MD;  Location: [facility] ENDOSCOPY;  Service: Pulmonary;;   ENDOBRONCHIAL ULTRASOUND  03/22/2022   Procedure: ENDOBRONCHIAL ULTRASOUND;  Surgeon: Isadora Hose, MD;  Location: [facility] ENDOSCOPY;  Service: Pulmonary;;   EXPLORATORY LAPAROTOMY  05/12/2015   EYE SURGERY     FRACTURE SURGERY     LAPAROTOMY N/A 05/10/2015   Procedure: EXPLORATORY LAPAROTOMY;  Surgeon: Lynda Leos, MD;  Location: [facility] OR;  Service: General;  Laterality: N/A;   LEFT HEART CATH AND CORONARY ANGIOGRAPHY N/A 05/02/2016   Procedure: Left Heart Cath and  Coronary Angiography;  Surgeon: Gordy Bergamo, MD;  Location: [facility] INVASIVE CV LAB;  Service: Cardiovascular;  Laterality: N/A;   LOWER EXTREMITY ANGIOGRAPHY Left 09/21/2021   Procedure: Lower Extremity Angiography;  Surgeon: Jama Cordella KANDICE, MD;  Location: [facility] INVASIVE CV LAB;  Service: Cardiovascular;  Laterality: Left;   Patient Active Problem List   Diagnosis Date Noted   Allergic rhinitis due to pollen 12/11/2022   Tinea cruris 12/11/2022   BPH associated with nocturia 12/11/2022   Chest pain, non-cardiac 04/11/2022   Adenocarcinoma of lower lobe of right lung (HCC) 03/28/2022   Pulmonary nodule 1 cm or greater in diameter 03/22/2022   Leg weakness, bilateral 02/09/2022   Osteopenia 10/13/2021   PAD (peripheral artery disease) 09/21/2021   GERD without esophagitis 09/21/2021   Dyslipidemia 09/21/2021   Depression 09/21/2021   Atrial flutter (HCC) 09/21/2021   Chronic obstructive pulmonary disease (COPD) (HCC) 09/21/2021   Coronary artery disease 09/21/2021   Cerebrovascular accident (CVA) due to embolism of cerebral artery (HCC) 09/06/2021   Bilateral edema of lower extremity 08/05/2020   Vasovagal syncope 08/02/2020   Annual physical exam 12/26/2019   Hyperlipidemia 12/26/2019   Abrasion of right hand 12/26/2019   Metabolic syndrome 11/11/2019   Low TSH level 02/17/2017   Atrial flutter with rapid ventricular response (HCC) 02/16/2017   Cigarette  smoker 02/16/2017   COPD with acute exacerbation (HCC) 02/16/2017   Elevated lactic acid level 02/16/2017   Aortic atherosclerosis 02/16/2017   Productive cough    Hypoxia 05/24/2015   SOB (shortness of breath) 05/24/2015   COPD suggested by initial evaluation 05/17/2015   Essential hypertension 05/17/2015   Smoking 05/17/2015    ONSET DATE: 08/09/2023   REFERRING DIAG: I63.9 (ICD-10-CM) - Cerebral infarction, unspecified   THERAPY DIAG:  Aphasia  Apraxia  Rationale for Evaluation and Treatment  Rehabilitation  SUBJECTIVE:   PERTINENT HISTORY and DIAGNOSTIC FINDINGS: Pt is a 73 year old right handed male who presented to [facility] ED on 08/09/2023 with stroke-like symptoms - slurred speech and difficulty answering questions and weakness to his right side extremities. CT head and subsequent CTA head and neck shows left M2 occlusion with large penumbra in the region. Pt transferred to [facility] for mechanical thrombectomy on 08/10/2023. Pt admitted at [facility] from 08/10/2023 thru 08/21/2023 with recommended for home health.   MRI brain: Remonstrated left MCA territory infarct primarily involving the parietal lobe.   Additional medical history includes: blindness left eye, A-fib on warfarin, COPD, HFpEF, HTN, current smoker, ethanol abuse, RLL adenocarcinoma s/p SBRT (2024)    PAIN:  Are you having pain? Unable to communicate  FALLS: Has patient fallen in last 6 months?  No  LIVING ENVIRONMENT: Lives with: sister-in-law Lives in: House/apartment  PLOF:  Level of assistance: Independent with ADLs, Independent with IADLs, Comment: poor health literacy, difficulty with reading and writing per family report Employment: Retired   PATIENT GOALS    per pt's sister-in-law, she hopes he will talk again  SUBJECTIVE STATEMENT: Pt pleasant, engaged, attended session with sister-in-law who reports we had a conversation in the car today and I understood every word Pt accompanied by: sister-in-law  OBJECTIVE:   TODAY'S TREATMENT:  Skilled ST treatment session targeted pt's cognitive communication, aphasia and apraxia of speech goals. SLP facilitated session by providing the following interventions:  Pt with increased spontaneous intelligible utterances today - much improved. Pt reported he was doing alright but his back was bothering him. Utterances contained portions of unintelligible speech but this Clinical research associate understood the message.   To increase comprehension skills, pictures of everyday items  paired with their words were presented in the following:  SLP verbally presented each picture and written word. Pt then said each word with the clinician. The last round consisted of pt saying word by self.  Everyday items: when verbalized with SLP: achieved 6 out of 10 trials independently, improved to 7 out of 10 with min A; when verbalized by self: pt achieved 5 out of 10 trials, improved to 6 out of 10 with min to max A. Pt with some portion of missed words intelligible but overall word was not intelligible throughout activity.   Following Directions Hierarchy Stage 4 - Given 3 pictures of everyday items, pt asked to point, in sequence, to 2 common objects by name. Pt independently achieved 10 out of 12 trials. Improved to 12 out of 12 with min A.  Stage 5 - Given 3 pictures of everyday items, pt asked to point to one object spelled (I.e point to p-e-n). Pt independently pointed to 10 out of 12 trials. Improved to 12 out of 12 trials with min to mod A.   SLP asked pt to point to common body parts on left or right side to target auditory comprehension. Pt independently pointed to 12 out of 15 body parts. Improved to  15 out of 15 with minimum A.    Locate Similar Category Items pp 15-16: Pt asked to circle 2 words, in each group of 3, that are in the same category. Pt independently achieved 26 out of 30 trials. Improved to 30 out of 30 with min to max A.   PATIENT EDUCATION: Education details: see above Person educated: Patient and his sister-in-law Education method: Explanation Education comprehension: needs further education  HOME EXERCISE PROGRAM:   Supplement verbal information with single written words  GOALS:  Goals reviewed with patient? Yes  SHORT TERM GOALS: Target date: 10 sessions  Updated: 11/15/2023 Updated: 10/10/2023 With maximal assistance, pt will select written word to express request for food/drink item in 7 out of 10 opportunities.  Baseline: Goal status: INITIAL:  MET  2.  With maximal multimodal assistance, pt will answer yes/no questions in 8 out of 10 opportunities.  Baseline:  Goal status: INITIAL: Is able to answer using written language when choosing between written yes/no: MET  3.  With maximal multimodal assistance, pt will verbally approximate common object names in 5 out of 10 opportunities.  Baseline:  Goal status: INITIAL: progress made: MET  4. With minimal assistance, pt will point to 1 common object by function in a field of 3 with 90% accuracy.  Baseline:  Goal status: INITIAL  5. With minimal to moderate assistance, pt will point, in sequence to 2 common objects by function in field of 3 with 90% accuracy.   Baseline:  Goal status: INITIAL  6. With minimal to moderate assistance pt with verablly approximate names of basic functional objects with > 75% speech intelligiblity at the word level.    Baseline:  Goal status: INITIAL  7. With minimal assistance, pt will identify body parts with 90% accuracy.   Baseline:  Goal status: INITIAL  8. With moderate assistance, pt will follow 1 step directions with 75% accuracy. Baseline:  Goal status: INITIAL  LONG TERM GOALS: Target date: 11/20/2023  Updated: 11/15/2023 Updated: 10/10/2023 Pt will use multimodal communication and maximal assistance to communicate basic wants and needs in 2 out of 5 opportunities per pt and family report.  Baseline:  Goal status: INITIAL: progress, continue targeting  2.  With Maximal A, patient/family will demonstrate understanding of the following concepts: aphasia, spontaneous recovery, communication vs conversation, strengths/strategies to promote success, local resources by answering multiple choice questions with 50% accuracy when provided supported conversation in order to increase patient and caregiver's participation in medical care.  Baseline:  Goal status: INITIAL: progress, continue targeting   ASSESSMENT:  CLINICAL  IMPRESSION: Patient is a 73 y.o. right handed male who was seen today for a speech language treatment d/t left MCA CVA.   Pt presents with the appearance of global aphasia - potentially Wernicke's Aphasia. See below for details.  Given the severity of pt's aphasia, he is not able to communicate any basic wants/needs which (per his family's report) results in increased pt frustration and agitation.   Verbal Communication Pt's verbal communication was fluent but nonsensical speech consisting of normal rate and rhythm but didn't convey meaning. Pt was not aware of unintelligible nature of speech with few rote utterances that were intelligible. As such, pt was not able to name any basic common objects or produce rote utterances such as DOW, MOY (with written cues) or sing Happy Birthday. No ability to repeat basic words observed.   Auditory Comprehension Pt with moderately impaired auditory comprehension - given obejct name pt was able  to select appropriate object in field of 2 in 4 out of 6 opportunities decreasing to 3 out of 8 opportunities with a field of 3, answered basic yes/no questions related to himself correctly in 4 out of 6 opportunities and followed basic 1-step directions related to self in 2 out of 4 opportunities.   Written Language During an unsuccessful attempt at verbal communication, he took this writer's pencil and attempted to write x 1 but was not able to write more than two letters ET but was able to write his first name.   Reading Comprehension Pt was able to pair written word with correct object in field of 3 with 100% accuracy (15 out of 15 opportunities).   See the above treatment note for details.   OBJECTIVE IMPAIRMENTS include expressive language, receptive language, aphasia, apraxia, and written language. These impairments are limiting patient from managing medications, managing appointments, managing finances, household responsibilities, ADLs/IADLs, and effectively  communicating at home and in community. Factors affecting potential to achieve goals and functional outcome are severity of impairments, family/community support, and significantly reduced health literacy. Patient will benefit from skilled SLP services to address above impairments and improve overall function.  REHAB POTENTIAL: Good  PLAN: SLP FREQUENCY: 1-2x/week  SLP DURATION: 12 weeks  PLANNED INTERVENTIONS: Language facilitation, Cueing hierachy, Internal/external aids, Functional tasks, Multimodal communication approach, SLP instruction and feedback, Compensatory strategies, and Patient/family education    Happi B. Rubbie, M.S., CCC-SLP, Tree surgeon Certified Brain Injury Specialist [facility]  [facility] Office [phone] Ascom [phone] Fax [phone]

## 2023-11-29 ENCOUNTER — Ambulatory Visit: Admitting: Physical Therapy

## 2023-11-29 ENCOUNTER — Ambulatory Visit: Admitting: Speech Pathology

## 2023-11-29 DIAGNOSIS — R4701 Aphasia: Secondary | ICD-10-CM

## 2023-11-29 DIAGNOSIS — R482 Apraxia: Secondary | ICD-10-CM

## 2023-11-29 NOTE — Therapy (Signed)
 OUTPATIENT SPEECH LANGUAGE PATHOLOGY  SPEECH LANGUAGE TREATMENT NOTE   Patient Name: Gregory Lane MRN: [id number] DOB:Jun 26, 1950, 73 y.o., male 45 Date: 11/29/2023   PCP: Gregory DELENA Bathe, MD REFERRING PROVIDER: Arland Earnie Pouch, MD    End of Session - 11/29/23 1313     Visit Number 24    Number of Visits 44    Date for Recertification  02/07/24    Authorization Type [employer] [place]    Progress Note Due on Visit 30    SLP Start Time 1315    SLP Stop Time  1400    SLP Time Calculation (min) 45 min    Activity Tolerance Patient tolerated treatment well          Past Medical History:  Diagnosis Date   Anxiety    Aortic atherosclerosis 02/16/2017   Arthritis    Blind left eye    COPD (chronic obstructive pulmonary disease) (HCC)    Depression    Dysrhythmia    A-fib   GERD (gastroesophageal reflux disease)    Hypertension    Low TSH level 02/17/2017   Myocardial infarction [facility])    PAD (peripheral artery disease)    Paroxysmal atrial fibrillation (HCC)    Perforated duodenal ulcer (HCC)    Stroke (HCC)    left side is weak   Past Surgical History:  Procedure Laterality Date   BRONCHIAL BIOPSY  03/22/2022   Procedure: BRONCHIAL BIOPSIES;  Surgeon: Isadora Hose, MD;  [facility]: [facility] END[facility];  Service: Pulmonary;;   BRONCHIAL NEEDLE ASPIRATION BIOPSY  03/22/2022   Procedure: BRONCHIAL NEEDLE ASPIRATION BIOPSIES;  Surgeon: Isadora Hose, MD;  [facility]: [facility];  Service: Pulmonary;;   ENDOBRONCHIAL ULTRASOUND  03/22/2022   Procedure: ENDOBRONCHIAL ULTRASOUND;  Surgeon: Isadora Hose, MD;  [facility]: [facility];  Service: Pulmonary;;   EXPLORATORY LAPAROTOMY  05/12/2015   EYE SURGERY     FRACTURE SURGERY     LAPAROTOMY N/A 05/10/2015   Procedure: EXPLORATORY LAPAROTOMY;  Surgeon: Lynda Leos, MD;  Location: [facility] OR;  Service: General;  Laterality: N/A;   LEFT HEART CATH AND CORONARY ANGIOGRAPHY N/A 05/02/2016   Procedure: Left Heart Cath and  Coronary Angiography;  Surgeon: Gordy Bergamo, MD;  Location: [facility] INVASIVE CV LAB;  Service: Cardiovascular;  Laterality: N/A;   LOWER EXTREMITY ANGIOGRAPHY Left 09/21/2021   Procedure: Lower Extremity Angiography;  Surgeon: Jama Cordella KANDICE, MD;  Location: [facility] INVASIVE CV LAB;  Service: Cardiovascular;  Laterality: Left;   Patient Active Problem List   Diagnosis Date Noted   Allergic rhinitis due to pollen 12/11/2022   Tinea cruris 12/11/2022   BPH associated with nocturia 12/11/2022   Chest pain, non-cardiac 04/11/2022   Adenocarcinoma of lower lobe of right lung (HCC) 03/28/2022   Pulmonary nodule 1 cm or greater in diameter 03/22/2022   Leg weakness, bilateral 02/09/2022   Osteopenia 10/13/2021   PAD (peripheral artery disease) 09/21/2021   GERD without esophagitis 09/21/2021   Dyslipidemia 09/21/2021   Depression 09/21/2021   Atrial flutter (HCC) 09/21/2021   Chronic obstructive pulmonary disease (COPD) (HCC) 09/21/2021   Coronary artery disease 09/21/2021   Cerebrovascular accident (CVA) due to embolism of cerebral artery (HCC) 09/06/2021   Bilateral edema of lower extremity 08/05/2020   Vasovagal syncope 08/02/2020   Annual physical exam 12/26/2019   Hyperlipidemia 12/26/2019   Abrasion of right hand 12/26/2019   Metabolic syndrome 11/11/2019   Low TSH level 02/17/2017   Atrial flutter with rapid ventricular response (HCC) 02/16/2017   Cigarette  smoker 02/16/2017   COPD with acute exacerbation (HCC) 02/16/2017   Elevated lactic acid level 02/16/2017   Aortic atherosclerosis 02/16/2017   Productive cough    Hypoxia 05/24/2015   SOB (shortness of breath) 05/24/2015   COPD suggested by initial evaluation 05/17/2015   Essential hypertension 05/17/2015   Smoking 05/17/2015    ONSET DATE: 08/09/2023   REFERRING DIAG: I63.9 (ICD-10-CM) - Cerebral infarction, unspecified   THERAPY DIAG:  Aphasia  Apraxia  Rationale for Evaluation and Treatment  Rehabilitation  SUBJECTIVE:   PERTINENT HISTORY and DIAGNOSTIC FINDINGS: Pt is a 73 year old right handed male who presented to [facility] ED on 08/09/2023 with stroke-like symptoms - slurred speech and difficulty answering questions and weakness to his right side extremities. CT head and subsequent CTA head and neck shows left M2 occlusion with large penumbra in the region. Pt transferred to [facility] for mechanical thrombectomy on 08/10/2023. Pt admitted at [facility] from 08/10/2023 thru 08/21/2023 with recommended for home health.   MRI brain: Remonstrated left MCA territory infarct primarily involving the parietal lobe.   Additional medical history includes: blindness left eye, A-fib on warfarin, COPD, HFpEF, HTN, current smoker, ethanol abuse, RLL adenocarcinoma s/p SBRT (2024)    PAIN:  Are you having pain? Unable to communicate  FALLS: Has patient fallen in last 6 months?  No  LIVING ENVIRONMENT: Lives with: sister-in-law Lives in: House/apartment  PLOF:  Level of assistance: Independent with ADLs, Independent with IADLs, Comment: poor health literacy, difficulty with reading and writing per family report Employment: Retired   PATIENT GOALS    per pt's sister-in-law, she hopes he will talk again  SUBJECTIVE STATEMENT: Pt pleasant, engaged, attended session with sister-in-law who reports he's been talking a lot clearer Pt accompanied by: sister-in-law  OBJECTIVE:   TODAY'S TREATMENT:  Skilled ST treatment session targeted pt's cognitive communication, aphasia and apraxia of speech goals. SLP facilitated session by providing the following interventions:  Pt with continued increased spontaneous intelligible utterances today. Utterances contained more portions of intelligible speech with increased ability to communicate message observed.  To increase comprehension skills, pictures of days of the week paired with their words were presented in the following:  SLP verbally presented each  picture and written word. Pt then said each word with the clinician. The last round consisted of pt saying word by self.  Days of the week: when verbalized with SLP: achieved 5 out of 7 trials independently, improved to 7 out of 7 with max A; when verbalized by self: pt achieved 3 out of 7 trials, with no improvement given max A.  During spontaneous conversation with this Clinical research associate, pt stated days of the week with increased intelligibility.   Following Directions Hierarchy Stage 3 - Given 3 pictures of everyday items, pt asked to point, in sequence, to 2 common objects by function. Pt independently achieved 7 out of 10 trials. Improved to 10 out of 10 with min A.  Stage 5 - Given 3 pictures of everyday items, pt asked to point to one object spelled (I.e point to p-e-n). Pt independently pointed to 4 out of 9 trials. Improved to 6 out of 9 trials with min to max A.   SLP asked pt to point to common body parts on left or right side to target auditory comprehension. Pt independently pointed to 10 out of 14 body parts. Improved to 13 out of 14 with min to max A.    [facility]: Aphasia Rehab Unit 3 pp 61-61: Given  3 answer choices, pt asked to choose the one that is the opposite of the provided word. Pt independently achieved 19 out of 24 trials. Improved to 24 out of 24 with min to max A.   PATIENT EDUCATION: Education details: see above Person educated: Patient and his sister-in-law Education method: Explanation Education comprehension: needs further education  HOME EXERCISE PROGRAM:   Supplement verbal information with single written words  GOALS:  Goals reviewed with patient? Yes  SHORT TERM GOALS: Target date: 10 sessions  Updated: 11/15/2023 Updated: 10/10/2023 With maximal assistance, pt will select written word to express request for food/drink item in 7 out of 10 opportunities.  Baseline: Goal status: INITIAL: MET  2.  With maximal multimodal assistance, pt will answer yes/no questions  in 8 out of 10 opportunities.  Baseline:  Goal status: INITIAL: Is able to answer using written language when choosing between written yes/no: MET  3.  With maximal multimodal assistance, pt will verbally approximate common object names in 5 out of 10 opportunities.  Baseline:  Goal status: INITIAL: progress made: MET  4. With minimal assistance, pt will point to 1 common object by function in a field of 3 with 90% accuracy.  Baseline:  Goal status: INITIAL  5. With minimal to moderate assistance, pt will point, in sequence to 2 common objects by function in field of 3 with 90% accuracy.   Baseline:  Goal status: INITIAL  6. With minimal to moderate assistance pt with verablly approximate names of basic functional objects with > 75% speech intelligiblity at the word level.    Baseline:  Goal status: INITIAL  7. With minimal assistance, pt will identify body parts with 90% accuracy.   Baseline:  Goal status: INITIAL  8. With moderate assistance, pt will follow 1 step directions with 75% accuracy. Baseline:  Goal status: INITIAL  LONG TERM GOALS: Target date: 11/20/2023  Updated: 11/15/2023 Updated: 10/10/2023 Pt will use multimodal communication and maximal assistance to communicate basic wants and needs in 2 out of 5 opportunities per pt and family report.  Baseline:  Goal status: INITIAL: progress, continue targeting  2.  With Maximal A, patient/family will demonstrate understanding of the following concepts: aphasia, spontaneous recovery, communication vs conversation, strengths/strategies to promote success, local resources by answering multiple choice questions with 50% accuracy when provided supported conversation in order to increase patient and caregiver's participation in medical care.  Baseline:  Goal status: INITIAL: progress, continue targeting   ASSESSMENT:  CLINICAL IMPRESSION: Patient is a 73 y.o. right handed male who was seen today for a speech language  treatment d/t left MCA CVA.   Pt presents with the appearance of global aphasia - potentially Wernicke's Aphasia. See below for details.  Given the severity of pt's aphasia, he is not able to communicate any basic wants/needs which (per his family's report) results in increased pt frustration and agitation.   Verbal Communication Pt's verbal communication was fluent but nonsensical speech consisting of normal rate and rhythm but didn't convey meaning. Pt was not aware of unintelligible nature of speech with few rote utterances that were intelligible. As such, pt was not able to name any basic common objects or produce rote utterances such as DOW, MOY (with written cues) or sing Happy Birthday. No ability to repeat basic words observed.   Auditory Comprehension Pt with moderately impaired auditory comprehension - given obejct name pt was able to select appropriate object in field of 2 in 4 out of 6 opportunities decreasing to  3 out of 8 opportunities with a field of 3, answered basic yes/no questions related to himself correctly in 4 out of 6 opportunities and followed basic 1-step directions related to self in 2 out of 4 opportunities.   Written Language During an unsuccessful attempt at verbal communication, he took this writer's pencil and attempted to write x 1 but was not able to write more than two letters ET but was able to write his first name.   Reading Comprehension Pt was able to pair written word with correct object in field of 3 with 100% accuracy (15 out of 15 opportunities).   See the above treatment note for details.   OBJECTIVE IMPAIRMENTS include expressive language, receptive language, aphasia, apraxia, and written language. These impairments are limiting patient from managing medications, managing appointments, managing finances, household responsibilities, ADLs/IADLs, and effectively communicating at home and in community. Factors affecting potential to achieve goals and  functional outcome are severity of impairments, family/community support, and significantly reduced health literacy. Patient will benefit from skilled SLP services to address above impairments and improve overall function.  REHAB POTENTIAL: Good  PLAN: SLP FREQUENCY: 1-2x/week  SLP DURATION: 12 weeks  PLANNED INTERVENTIONS: Language facilitation, Cueing hierachy, Internal/external aids, Functional tasks, Multimodal communication approach, SLP instruction and feedback, Compensatory strategies, and Patient/family education    Happi B. Rubbie, M.S., CCC-SLP, Tree surgeon Certified Brain Injury Specialist [facility]  [facility] Rehabilitation Services Office [phone] Ascom [phone] Fax [phone]

## 2023-12-04 ENCOUNTER — Ambulatory Visit: Admitting: Physical Therapy

## 2023-12-04 ENCOUNTER — Ambulatory Visit: Admitting: Speech Pathology

## 2023-12-04 ENCOUNTER — Ambulatory Visit: Admitting: Occupational Therapy

## 2023-12-04 DIAGNOSIS — R4701 Aphasia: Secondary | ICD-10-CM

## 2023-12-04 NOTE — Therapy (Signed)
 OUTPATIENT SPEECH LANGUAGE PATHOLOGY  SPEECH LANGUAGE TREATMENT NOTE   Patient Name: Gregory Lane MRN: [id number] DOB:Jul 13, 1950, 73 y.o., male 18 Date: 12/04/2023   PCP: Denyse DELENA Bathe, MD REFERRING PROVIDER: Arland Earnie Pouch, MD    End of Session - 12/04/23 1102     Visit Number 25    Number of Visits 44    Date for Recertification  02/07/24    Authorization Type [employer] [place]    Progress Note Due on Visit 30    SLP Start Time 0930    SLP Stop Time  1005    SLP Time Calculation (min) 35 min    Activity Tolerance Patient tolerated treatment well          Past Medical History:  Diagnosis Date   Anxiety    Aortic atherosclerosis 02/16/2017   Arthritis    Blind left eye    COPD (chronic obstructive pulmonary disease) (HCC)    Depression    Dysrhythmia    A-fib   GERD (gastroesophageal reflux disease)    Hypertension    Low TSH level 02/17/2017   Myocardial infarction [facility])    PAD (peripheral artery disease)    Paroxysmal atrial fibrillation (HCC)    Perforated duodenal ulcer (HCC)    Stroke (HCC)    left side is weak   Past Surgical History:  Procedure Laterality Date   BRONCHIAL BIOPSY  03/22/2022   Procedure: BRONCHIAL BIOPSIES;  Surgeon: Isadora Hose, MD;  [facility]: [facility];  Service: Pulmonary;;   BRONCHIAL NEEDLE ASPIRATION BIOPSY  03/22/2022   Procedure: BRONCHIAL NEEDLE ASPIRATION BIOPSIES;  Surgeon: Isadora Hose, MD;  Location: [facility];  Service: Pulmonary;;   ENDOBRONCHIAL ULTRASOUND  03/22/2022   Procedure: ENDOBRONCHIAL ULTRASOUND;  Surgeon: Isadora Hose, MD;  Location: [facility];  Service: Pulmonary;;   EXPLORATORY LAPAROTOMY  05/12/2015   EYE SURGERY     FRACTURE SURGERY     LAPAROTOMY N/A 05/10/2015   Procedure: EXPLORATORY LAPAROTOMY;  Surgeon: Lynda Leos, MD;  Location: [facility] OR;  Service: General;  Laterality: N/A;   LEFT HEART CATH AND CORONARY ANGIOGRAPHY N/A 05/02/2016   Procedure: Left Heart Cath and  Coronary Angiography;  Surgeon: Gordy Bergamo, MD;  Location: [facility] INVASIVE CV LAB;  Service: Cardiovascular;  Laterality: N/A;   LOWER EXTREMITY ANGIOGRAPHY Left 09/21/2021   Procedure: Lower Extremity Angiography;  Surgeon: Jama Cordella KANDICE, MD;  Location: [facility] INVASIVE CV LAB;  Service: Cardiovascular;  Laterality: Left;   Patient Active Problem List   Diagnosis Date Noted   Allergic rhinitis due to pollen 12/11/2022   Tinea cruris 12/11/2022   BPH associated with nocturia 12/11/2022   Chest pain, non-cardiac 04/11/2022   Adenocarcinoma of lower lobe of right lung (HCC) 03/28/2022   Pulmonary nodule 1 cm or greater in diameter 03/22/2022   Leg weakness, bilateral 02/09/2022   Osteopenia 10/13/2021   PAD (peripheral artery disease) 09/21/2021   GERD without esophagitis 09/21/2021   Dyslipidemia 09/21/2021   Depression 09/21/2021   Atrial flutter (HCC) 09/21/2021   Chronic obstructive pulmonary disease (COPD) (HCC) 09/21/2021   Coronary artery disease 09/21/2021   Cerebrovascular accident (CVA) due to embolism of cerebral artery (HCC) 09/06/2021   Bilateral edema of lower extremity 08/05/2020   Vasovagal syncope 08/02/2020   Annual physical exam 12/26/2019   Hyperlipidemia 12/26/2019   Abrasion of right hand 12/26/2019   Metabolic syndrome 11/11/2019   Low TSH level 02/17/2017   Atrial flutter with rapid ventricular response (HCC) 02/16/2017   Cigarette  smoker 02/16/2017   COPD with acute exacerbation (HCC) 02/16/2017   Elevated lactic acid level 02/16/2017   Aortic atherosclerosis 02/16/2017   Productive cough    Hypoxia 05/24/2015   SOB (shortness of breath) 05/24/2015   COPD suggested by initial evaluation 05/17/2015   Essential hypertension 05/17/2015   Smoking 05/17/2015    ONSET DATE: 08/09/2023   REFERRING DIAG: I63.9 (ICD-10-CM) - Cerebral infarction, unspecified   THERAPY DIAG:  Aphasia  Rationale for Evaluation and Treatment Rehabilitation  SUBJECTIVE:    PERTINENT HISTORY and DIAGNOSTIC FINDINGS: Pt is a 73 year old right handed male who presented to [facility] ED on 08/09/2023 with stroke-like symptoms - slurred speech and difficulty answering questions and weakness to his right side extremities. CT head and subsequent CTA head and neck shows left M2 occlusion with large penumbra in the region. Pt transferred to [facility] for mechanical thrombectomy on 08/10/2023. Pt admitted at [facility] from 08/10/2023 thru 08/21/2023 with recommended for home health.   MRI brain: Remonstrated left MCA territory infarct primarily involving the parietal lobe.   Additional medical history includes: blindness left eye, A-fib on warfarin, COPD, HFpEF, HTN, current smoker, ethanol abuse, RLL adenocarcinoma s/p SBRT (2024)    PAIN:  Are you having pain? Unable to communicate  FALLS: Has patient fallen in last 6 months?  No  LIVING ENVIRONMENT: Lives with: sister-in-law Lives in: House/apartment  PLOF:  Level of assistance: Independent with ADLs, Independent with IADLs, Comment: poor health literacy, difficulty with reading and writing per family report Employment: Retired   PATIENT GOALS    per pt's sister-in-law, she hopes he will talk again  SUBJECTIVE STATEMENT: Pt pleasant, engaged, attended session with sister-in-law who reports  Pt accompanied by: sister-in-law  OBJECTIVE:   TODAY'S TREATMENT:  Skilled ST treatment session targeted pt's cognitive communication, aphasia and apraxia of speech goals. SLP facilitated session by providing the following interventions:  Western Aphasia Battery-Revised (WAB-R): Part 1  Spontaneous Speech:  Informational Content: 5/10 Fluency, Grammatical Content, and Paraphasias: 3/10 - c/b neologisms, improved with sentence completion cues  Auditory Verbal Comprehension:  Yes/No Questions: 51/60 Auditory Word Recognition: 58/60 Sequential Commands: 14/80  Repetition:  Single Words, Phrases, Sentences: 4/100  Naming and  Word Finding: Object Naming: 15/60 Word Fluency: 0/20 Sentence Completion: 0/10 Responsive Speech: 0/10  Error types: neologisms  Aphasia Quotient: 32 Aphasia Severity: Severe (26-50)  Aphasia Type: Wernicke's     PATIENT EDUCATION: Education details: see above Person educated: Patient and his sister-in-law Education method: Explanation Education comprehension: needs further education  HOME EXERCISE PROGRAM:   Supplement verbal information with single written words  GOALS:  Goals reviewed with patient? Yes  SHORT TERM GOALS: Target date: 10 sessions  Updated: 11/15/2023 Updated: 10/10/2023 With maximal assistance, pt will select written word to express request for food/drink item in 7 out of 10 opportunities.  Baseline: Goal status: INITIAL: MET  2.  With maximal multimodal assistance, pt will answer yes/no questions in 8 out of 10 opportunities.  Baseline:  Goal status: INITIAL: Is able to answer using written language when choosing between written yes/no: MET  3.  With maximal multimodal assistance, pt will verbally approximate common object names in 5 out of 10 opportunities.  Baseline:  Goal status: INITIAL: progress made: MET  4. With minimal assistance, pt will point to 1 common object by function in a field of 3 with 90% accuracy.  Baseline:  Goal status: INITIAL  5. With minimal to moderate assistance, pt will point, in sequence  to 2 common objects by function in field of 3 with 90% accuracy.   Baseline:  Goal status: INITIAL  6. With minimal to moderate assistance pt with verablly approximate names of basic functional objects with > 75% speech intelligiblity at the word level.    Baseline:  Goal status: INITIAL  7. With minimal assistance, pt will identify body parts with 90% accuracy.   Baseline:  Goal status: INITIAL  8. With moderate assistance, pt will follow 1 step directions with 75% accuracy. Baseline:  Goal status: INITIAL  LONG TERM  GOALS: Target date: 11/20/2023  Updated: 11/15/2023 Updated: 10/10/2023 Pt will use multimodal communication and maximal assistance to communicate basic wants and needs in 2 out of 5 opportunities per pt and family report.  Baseline:  Goal status: INITIAL: progress, continue targeting  2.  With Maximal A, patient/family will demonstrate understanding of the following concepts: aphasia, spontaneous recovery, communication vs conversation, strengths/strategies to promote success, local resources by answering multiple choice questions with 50% accuracy when provided supported conversation in order to increase patient and caregiver's participation in medical care.  Baseline:  Goal status: INITIAL: progress, continue targeting   ASSESSMENT:  CLINICAL IMPRESSION: Patient is a 73 y.o. right handed male who was seen today for a speech language treatment d/t left MCA CVA.   Pt presents with improving language impairment, now conceptualized as moderate to severe Wernicke's Aphasia (as per WAB-R, above).   Verbal Communication Pt's verbal communication continues to be fluent but increased number of islands of intelligible speech especially when commenting.   Auditory Comprehension Pt with improved auditory comprehension as evidenced by his ability to select objects in field of 4, answer basic to progressing semi-complex yes/no questions and following 1 step directions.   Written Language Pt with improved letter recognition and ability to copy written words.   Reading Comprehension Continues to be a strength for pt. Utilized to supplement receptive language deficits.   See the above treatment note for details.   OBJECTIVE IMPAIRMENTS include expressive language, receptive language, aphasia, apraxia, and written language. These impairments are limiting patient from managing medications, managing appointments, managing finances, household responsibilities, ADLs/IADLs, and effectively communicating  at home and in community. Factors affecting potential to achieve goals and functional outcome are severity of impairments, family/community support, and significantly reduced health literacy. Patient will benefit from skilled SLP services to address above impairments and improve overall function.  REHAB POTENTIAL: Good  PLAN: SLP FREQUENCY: 1-2x/week  SLP DURATION: 12 weeks  PLANNED INTERVENTIONS: Language facilitation, Cueing hierachy, Internal/external aids, Functional tasks, Multimodal communication approach, SLP instruction and feedback, Compensatory strategies, and Patient/family education    Tyreek Clabo B. Rubbie, M.S., CCC-SLP, Tree surgeon Certified Brain Injury Specialist [facility]  [facility] Rehabilitation Services Office [phone] Ascom [phone] Fax [phone]

## 2023-12-06 ENCOUNTER — Ambulatory Visit: Admitting: Speech Pathology

## 2023-12-06 DIAGNOSIS — R4701 Aphasia: Secondary | ICD-10-CM

## 2023-12-06 DIAGNOSIS — R482 Apraxia: Secondary | ICD-10-CM

## 2023-12-06 NOTE — Therapy (Signed)
 OUTPATIENT SPEECH LANGUAGE PATHOLOGY  SPEECH LANGUAGE TREATMENT NOTE   Patient Name: Gregory Lane MRN: [id number] DOB:[birth year], 73 y.o., male 37 Date: 12/06/2023   PCP: Denyse DELENA Bathe, MD REFERRING PROVIDER: Arland Earnie Pouch, MD    End of Session - 12/06/23 1310     Visit Number 26    Number of Visits 44    Date for Recertification  02/07/24    Authorization Type [employer] [place]    Progress Note Due on Visit 30    SLP Start Time 1315    SLP Stop Time  1400    SLP Time Calculation (min) 45 min    Activity Tolerance Patient tolerated treatment well          Past Medical History:  Diagnosis Date   Anxiety    Aortic atherosclerosis 02/16/2017   Arthritis    Blind left eye    COPD (chronic obstructive pulmonary disease) (HCC)    Depression    Dysrhythmia    A-fib   GERD (gastroesophageal reflux disease)    Hypertension    Low TSH level 02/17/2017   Myocardial infarction [facility])    PAD (peripheral artery disease)    Paroxysmal atrial fibrillation (HCC)    Perforated duodenal ulcer (HCC)    Stroke (HCC)    left side is weak   Past Surgical History:  Procedure Laterality Date   BRONCHIAL BIOPSY  03/22/2022   Procedure: BRONCHIAL BIOPSIES;  Surgeon: Isadora Hose, MD;  [facility] END[facility];  Service: Pulmonary;;   BRONCHIAL NEEDLE ASPIRATION BIOPSY  03/22/2022   Procedure: BRONCHIAL NEEDLE ASPIRATION BIOPSIES;  Surgeon: Isadora Hose, MD;  [facility]: [facility];  Service: Pulmonary;;   ENDOBRONCHIAL ULTRASOUND  03/22/2022   Procedure: ENDOBRONCHIAL ULTRASOUND;  Surgeon: Isadora Hose, MD;  [facility];  Service: Pulmonary;;   EXPLORATORY LAPAROTOMY  05/12/2015   EYE SURGERY     FRACTURE SURGERY     LAPAROTOMY N/A 05/10/2015   Procedure: EXPLORATORY LAPAROTOMY;  Surgeon: Lynda Leos, MD;  Location: [facility] OR;  Service: General;  Laterality: N/A;   LEFT HEART CATH AND CORONARY ANGIOGRAPHY N/A 05/02/2016   Procedure: Left Heart Cath and  Coronary Angiography;  Surgeon: Gordy Bergamo, MD;  Location: [facility] INVASIVE CV LAB;  Service: Cardiovascular;  Laterality: N/A;   LOWER EXTREMITY ANGIOGRAPHY Left 09/21/2021   Procedure: Lower Extremity Angiography;  Surgeon: Jama Cordella KANDICE, MD;  Location: [facility] INVASIVE CV LAB;  Service: Cardiovascular;  Laterality: Left;   Patient Active Problem List   Diagnosis Date Noted   Allergic rhinitis due to pollen 12/11/2022   Tinea cruris 12/11/2022   BPH associated with nocturia 12/11/2022   Chest pain, non-cardiac 04/11/2022   Adenocarcinoma of lower lobe of right lung (HCC) 03/28/2022   Pulmonary nodule 1 cm or greater in diameter 03/22/2022   Leg weakness, bilateral 02/09/2022   Osteopenia 10/13/2021   PAD (peripheral artery disease) 09/21/2021   GERD without esophagitis 09/21/2021   Dyslipidemia 09/21/2021   Depression 09/21/2021   Atrial flutter (HCC) 09/21/2021   Chronic obstructive pulmonary disease (COPD) (HCC) 09/21/2021   Coronary artery disease 09/21/2021   Cerebrovascular accident (CVA) due to embolism of cerebral artery (HCC) 09/06/2021   Bilateral edema of lower extremity 08/05/2020   Vasovagal syncope 08/02/2020   Annual physical exam 12/26/2019   Hyperlipidemia 12/26/2019   Abrasion of right hand 12/26/2019   Metabolic syndrome 11/11/2019   Low TSH level 02/17/2017   Atrial flutter with rapid ventricular response (HCC) 02/16/2017   Cigarette  smoker 02/16/2017   COPD with acute exacerbation (HCC) 02/16/2017   Elevated lactic acid level 02/16/2017   Aortic atherosclerosis 02/16/2017   Productive cough    Hypoxia 05/24/2015   SOB (shortness of breath) 05/24/2015   COPD suggested by initial evaluation 05/17/2015   Essential hypertension 05/17/2015   Smoking 05/17/2015    ONSET DATE: 08/09/2023   REFERRING DIAG: I63.9 (ICD-10-CM) - Cerebral infarction, unspecified   THERAPY DIAG:  Aphasia  Apraxia  Rationale for Evaluation and Treatment  Rehabilitation  SUBJECTIVE:   PERTINENT HISTORY and DIAGNOSTIC FINDINGS: Pt is a 73 year old right handed male who presented to [facility] ED on 08/09/2023 with stroke-like symptoms - slurred speech and difficulty answering questions and weakness to his right side extremities. CT head and subsequent CTA head and neck shows left M2 occlusion with large penumbra in the region. Pt transferred to [facility] for mechanical thrombectomy on 08/10/2023. Pt admitted at [facility] from 08/10/2023 thru 08/21/2023 with recommended for home health.   MRI brain: Remonstrated left MCA territory infarct primarily involving the parietal lobe.   Additional medical history includes: blindness left eye, A-fib on warfarin, COPD, HFpEF, HTN, current smoker, ethanol abuse, RLL adenocarcinoma s/p SBRT (2024)    PAIN:  Are you having pain? Unable to communicate  FALLS: Has patient fallen in last 6 months?  No  LIVING ENVIRONMENT: Lives with: sister-in-law Lives in: House/apartment  PLOF:  Level of assistance: Independent with ADLs, Independent with IADLs, Comment: poor health literacy, difficulty with reading and writing per family report Employment: Retired   PATIENT GOALS    per pt's sister-in-law, she hopes he will talk again  SUBJECTIVE STATEMENT: Pt engaged, attended with sister-in-law Pt accompanied by: sister-in-law  OBJECTIVE:   TODAY'S TREATMENT:  Skilled ST treatment session targeted pt's cognitive communication, aphasia and apraxia of speech goals. SLP facilitated session by providing the following interventions:  To increase comprehension skills, pictures of days of the week paired with their words were presented in the following:  SLP verbally presented each picture and written word. Pt then said each word with the clinician. The last round consisted of pt saying word by self.  Everyday items: when verbalized with SLP: achieved 9 out of 10 trials independently, with no improvement given max A; when  verbalized by self: pt achieved 3 out of 10 trials, improved to 6 out of 10 given min to max A.  Given index card with pt's name, pt independently produced his name intelligibly. Pt required max A to produce sister-in-laws name and dogs name.   Following Directions Hierarchy Stage 6 - Given 3 pictures of everyday items, pt asked to point to 1 object described with 3 descriptors. Pt independently achieved 7 out of 9 trials. Improved to 8 out of 9 with min A.  Stage 8 - Given 3 pictures of everyday items, pt asked to point, in sequence, to 3 common objects by function. Pt independently pointed to 3 out of 3 trials. SLP further facilitated activity by increasing to a field of 4. Despite being given 3 items to point to, pt pointed to all 4 objects. Curious to know if pt;s errors were d/t impulsivity. Pt given finger cues to count number of items in attempt to decrease impulsivity. With finger cues and given intermittent repetition, pt able to achieve 8 out of 8 trials.   [facility]: Aphasia [facility] 3 Vocabulary: pp 38-75 - Given a field of 3, pt asked to choose the word that is associated with  the provided word. Pt independently achieved 42 out of 45 trials. Improved to 45 out of 45 given min A.   PATIENT EDUCATION: Education details: see above Person educated: Patient and his sister-in-law Education method: Explanation Education comprehension: needs further education  HOME EXERCISE PROGRAM:   Supplement verbal information with single written words  GOALS:  Goals reviewed with patient? Yes  SHORT TERM GOALS: Target date: 10 sessions  Updated: 11/15/2023 Updated: 10/10/2023 With maximal assistance, pt will select written word to express request for food/drink item in 7 out of 10 opportunities.  Baseline: Goal status: INITIAL: MET  2.  With maximal multimodal assistance, pt will answer yes/no questions in 8 out of 10 opportunities.  Baseline:  Goal status: INITIAL: Is able to answer using  written language when choosing between written yes/no: MET  3.  With maximal multimodal assistance, pt will verbally approximate common object names in 5 out of 10 opportunities.  Baseline:  Goal status: INITIAL: progress made: MET  4. With minimal assistance, pt will point to 1 common object by function in a field of 3 with 90% accuracy.  Baseline:  Goal status: INITIAL  5. With minimal to moderate assistance, pt will point, in sequence to 2 common objects by function in field of 3 with 90% accuracy.   Baseline:  Goal status: INITIAL  6. With minimal to moderate assistance pt with verablly approximate names of basic functional objects with > 75% speech intelligiblity at the word level.    Baseline:  Goal status: INITIAL  7. With minimal assistance, pt will identify body parts with 90% accuracy.   Baseline:  Goal status: INITIAL  8. With moderate assistance, pt will follow 1 step directions with 75% accuracy. Baseline:  Goal status: INITIAL  LONG TERM GOALS: Target date: 11/20/2023  Updated: 11/15/2023 Updated: 10/10/2023 Pt will use multimodal communication and maximal assistance to communicate basic wants and needs in 2 out of 5 opportunities per pt and family report.  Baseline:  Goal status: INITIAL: progress, continue targeting  2.  With Maximal A, patient/family will demonstrate understanding of the following concepts: aphasia, spontaneous recovery, communication vs conversation, strengths/strategies to promote success, local resources by answering multiple choice questions with 50% accuracy when provided supported conversation in order to increase patient and caregiver's participation in medical care.  Baseline:  Goal status: INITIAL: progress, continue targeting   ASSESSMENT:  CLINICAL IMPRESSION: Patient is a 73 y.o. right handed male who was seen today for a speech language treatment d/t left MCA CVA.   Pt presents with improving language impairment, now  conceptualized as moderate to severe Wernicke's Aphasia (as per WAB-R, above).   Verbal Communication Pt's verbal communication continues to be fluent but increased number of islands of intelligible speech especially when commenting.   Auditory Comprehension Pt with improved auditory comprehension as evidenced by his ability to select objects in field of 4, answer basic to progressing semi-complex yes/no questions and following 1 step directions.   Written Language Pt with improved letter recognition and ability to copy written words.   Reading Comprehension Continues to be a strength for pt. Utilized to supplement receptive language deficits.   See the above treatment note for details.   OBJECTIVE IMPAIRMENTS include expressive language, receptive language, aphasia, apraxia, and written language. These impairments are limiting patient from managing medications, managing appointments, managing finances, household responsibilities, ADLs/IADLs, and effectively communicating at home and in community. Factors affecting potential to achieve goals and functional outcome are severity of impairments, family/community  support, and significantly reduced health literacy. Patient will benefit from skilled SLP services to address above impairments and improve overall function.  REHAB POTENTIAL: Good  PLAN: SLP FREQUENCY: 1-2x/week  SLP DURATION: 12 weeks  PLANNED INTERVENTIONS: Language facilitation, Cueing hierachy, Internal/external aids, Functional tasks, Multimodal communication approach, SLP instruction and feedback, Compensatory strategies, and Patient/family education    Happi B. Rubbie, M.S., CCC-SLP, Tree surgeon Certified Brain Injury Specialist [facility]  [facility] Rehabilitation Services Office [phone] Ascom [phone] Fax [phone]

## 2023-12-11 ENCOUNTER — Encounter

## 2023-12-11 ENCOUNTER — Ambulatory Visit: Admitting: Speech Pathology

## 2023-12-11 DIAGNOSIS — R4701 Aphasia: Secondary | ICD-10-CM | POA: Diagnosis not present

## 2023-12-11 DIAGNOSIS — R482 Apraxia: Secondary | ICD-10-CM

## 2023-12-11 NOTE — Therapy (Signed)
 OUTPATIENT SPEECH LANGUAGE PATHOLOGY  SPEECH LANGUAGE TREATMENT NOTE   Patient Name: Gregory Lane MRN: [id number] DOB:11/02/1950, 73 y.o., male 82 Date: 12/11/2023   PCP: Denyse DELENA Bathe, MD REFERRING PROVIDER: Arland Earnie Pouch, MD    End of Session - 12/11/23 0932     Visit Number 27    Number of Visits 44    Date for Recertification  02/07/24    Authorization Type [employer] [place]    Progress Note Due on Visit 30    SLP Start Time 0932    SLP Stop Time  1015    SLP Time Calculation (min) 43 min    Activity Tolerance Patient tolerated treatment well          Past Medical History:  Diagnosis Date   Anxiety    Aortic atherosclerosis 02/16/2017   Arthritis    Blind left eye    COPD (chronic obstructive pulmonary disease) (HCC)    Depression    Dysrhythmia    A-fib   GERD (gastroesophageal reflux disease)    Hypertension    Low TSH level 02/17/2017   Myocardial infarction [facility])    PAD (peripheral artery disease)    Paroxysmal atrial fibrillation (HCC)    Perforated duodenal ulcer (HCC)    Stroke (HCC)    left side is weak   Past Surgical History:  Procedure Laterality Date   BRONCHIAL BIOPSY  03/22/2022   Procedure: BRONCHIAL BIOPSIES;  Surgeon: Isadora Hose, MD;  [facility];  Service: Pulmonary;;   BRONCHIAL NEEDLE ASPIRATION BIOPSY  03/22/2022   Procedure: BRONCHIAL NEEDLE ASPIRATION BIOPSIES;  Surgeon: Isadora Hose, MD;  Location: [facility];  Service: Pulmonary;;   ENDOBRONCHIAL ULTRASOUND  03/22/2022   Procedure: ENDOBRONCHIAL ULTRASOUND;  Surgeon: Isadora Hose, MD;  [facility]: [facility];  Service: Pulmonary;;   EXPLORATORY LAPAROTOMY  05/12/2015   EYE SURGERY     FRACTURE SURGERY     LAPAROTOMY N/A 05/10/2015   Procedure: EXPLORATORY LAPAROTOMY;  Surgeon: Lynda Leos, MD;  Location: [facility] OR;  Service: General;  Laterality: N/A;   LEFT HEART CATH AND CORONARY ANGIOGRAPHY N/A 05/02/2016   Procedure: Left Heart Cath and  Coronary Angiography;  Surgeon: Gordy Bergamo, MD;  Location: [facility] INVASIVE CV LAB;  Service: Cardiovascular;  Laterality: N/A;   LOWER EXTREMITY ANGIOGRAPHY Left 09/21/2021   Procedure: Lower Extremity Angiography;  Surgeon: Jama Cordella KANDICE, MD;  Location: [facility] INVASIVE CV LAB;  Service: Cardiovascular;  Laterality: Left;   Patient Active Problem List   Diagnosis Date Noted   Allergic rhinitis due to pollen 12/11/2022   Tinea cruris 12/11/2022   BPH associated with nocturia 12/11/2022   Chest pain, non-cardiac 04/11/2022   Adenocarcinoma of lower lobe of right lung (HCC) 03/28/2022   Pulmonary nodule 1 cm or greater in diameter 03/22/2022   Leg weakness, bilateral 02/09/2022   Osteopenia 10/13/2021   PAD (peripheral artery disease) 09/21/2021   GERD without esophagitis 09/21/2021   Dyslipidemia 09/21/2021   Depression 09/21/2021   Atrial flutter (HCC) 09/21/2021   Chronic obstructive pulmonary disease (COPD) (HCC) 09/21/2021   Coronary artery disease 09/21/2021   Cerebrovascular accident (CVA) due to embolism of cerebral artery (HCC) 09/06/2021   Bilateral edema of lower extremity 08/05/2020   Vasovagal syncope 08/02/2020   Annual physical exam 12/26/2019   Hyperlipidemia 12/26/2019   Abrasion of right hand 12/26/2019   Metabolic syndrome 11/11/2019   Low TSH level 02/17/2017   Atrial flutter with rapid ventricular response (HCC) 02/16/2017   Cigarette  smoker 02/16/2017   COPD with acute exacerbation (HCC) 02/16/2017   Elevated lactic acid level 02/16/2017   Aortic atherosclerosis 02/16/2017   Productive cough    Hypoxia 05/24/2015   SOB (shortness of breath) 05/24/2015   COPD suggested by initial evaluation 05/17/2015   Essential hypertension 05/17/2015   Smoking 05/17/2015    ONSET DATE: 08/09/2023   REFERRING DIAG: I63.9 (ICD-10-CM) - Cerebral infarction, unspecified   THERAPY DIAG:  Aphasia  Apraxia  Rationale for Evaluation and Treatment  Rehabilitation  SUBJECTIVE:   PERTINENT HISTORY and DIAGNOSTIC FINDINGS: Pt is a 73 year old right handed male who presented to [facility] ED on 08/09/2023 with stroke-like symptoms - slurred speech and difficulty answering questions and weakness to his right side extremities. CT head and subsequent CTA head and neck shows left M2 occlusion with large penumbra in the region. Pt transferred to [facility] for mechanical thrombectomy on 08/10/2023. Pt admitted at [facility] from 08/10/2023 thru 08/21/2023 with recommended for home health.   MRI brain: Remonstrated left MCA territory infarct primarily involving the parietal lobe.   Additional medical history includes: blindness left eye, A-fib on warfarin, COPD, HFpEF, HTN, current smoker, ethanol abuse, RLL adenocarcinoma s/p SBRT (2024)    PAIN:  Are you having pain? Unable to communicate  FALLS: Has patient fallen in last 6 months?  No  LIVING ENVIRONMENT: Lives with: sister-in-law Lives in: House/apartment  PLOF:  Level of assistance: Independent with ADLs, Independent with IADLs, Comment: poor health literacy, difficulty with reading and writing per family report Employment: Retired   PATIENT GOALS    per pt's sister-in-law, she hopes he will talk again  SUBJECTIVE STATEMENT: Pt engaged, sister-in-law reports he didn't get much sleep last night Pt reports just one of those days Pt accompanied by: sister-in-law  OBJECTIVE:   TODAY'S TREATMENT:  Skilled ST treatment session targeted pt's cognitive communication, aphasia and apraxia of speech goals. SLP facilitated session by providing the following interventions:  To increase comprehension skills, pictures of new everyday items paired with their words were presented in the following:  SLP verbally presented each picture and written word. Pt then said each word with the clinician. The last round consisted of pt saying word by self.  New everyday items: when verbalized with SLP: achieved 6 out  of 10 trials independently, improved to 7 out of 10 given min to max A; when verbalized by self: pt achieved 6 out of 10 trials, improved to 8 out of 10 given min to max A.  Given index card with pt's name, pt independently produced his name intelligibly. Pt required max A (functional sentence completion cues) to produce sister-in-laws name and dogs name.   Receptive Language further targeted thru: Stage 1 - Given 3 pictures of everyday items, pt asked to point to 1 object named. Pt independently achieved 3 out of 3 opportunities. SLP further facilitated activity by increasing to a field of 4. Pt achieved 5 out of 7 independently, improved to 7 out of 7 given min A.  Stage 2 - Given 3 pictures of everyday items, pt asked to point to 1 object by function. Pt independently pointed to 4 out of 10 opportunities. Improved to 7 out of 10 given min to max A.   [employer] Therapy app Answering questions Level 1: Pt independently achieved 16 out of 24 trials. Improved to 18 out of 25 with max A.   PATIENT EDUCATION: Education details: see above Person educated: Patient and his sister-in-law Education method: Hospital doctor  comprehension: needs further education  HOME EXERCISE PROGRAM:   Supplement verbal information with single written words  GOALS:  Goals reviewed with patient? Yes  SHORT TERM GOALS: Target date: 10 sessions  Updated: 11/15/2023 Updated: 10/10/2023 With maximal assistance, pt will select written word to express request for food/drink item in 7 out of 10 opportunities.  Baseline: Goal status: INITIAL: MET  2.  With maximal multimodal assistance, pt will answer yes/no questions in 8 out of 10 opportunities.  Baseline:  Goal status: INITIAL: Is able to answer using written language when choosing between written yes/no: MET  3.  With maximal multimodal assistance, pt will verbally approximate common object names in 5 out of 10 opportunities.  Baseline:  Goal status:  INITIAL: progress made: MET  4. With minimal assistance, pt will point to 1 common object by function in a field of 3 with 90% accuracy.  Baseline:  Goal status: INITIAL  5. With minimal to moderate assistance, pt will point, in sequence to 2 common objects by function in field of 3 with 90% accuracy.   Baseline:  Goal status: INITIAL  6. With minimal to moderate assistance pt with verablly approximate names of basic functional objects with > 75% speech intelligiblity at the word level.    Baseline:  Goal status: INITIAL  7. With minimal assistance, pt will identify body parts with 90% accuracy.   Baseline:  Goal status: INITIAL  8. With moderate assistance, pt will follow 1 step directions with 75% accuracy. Baseline:  Goal status: INITIAL  LONG TERM GOALS: Target date: 11/20/2023  Updated: 11/15/2023 Updated: 10/10/2023 Pt will use multimodal communication and maximal assistance to communicate basic wants and needs in 2 out of 5 opportunities per pt and family report.  Baseline:  Goal status: INITIAL: progress, continue targeting  2.  With Maximal A, patient/family will demonstrate understanding of the following concepts: aphasia, spontaneous recovery, communication vs conversation, strengths/strategies to promote success, local resources by answering multiple choice questions with 50% accuracy when provided supported conversation in order to increase patient and caregiver's participation in medical care.  Baseline:  Goal status: INITIAL: progress, continue targeting   ASSESSMENT:  CLINICAL IMPRESSION: Patient is a 73 y.o. right handed male who was seen today for a speech language treatment d/t left MCA CVA.   Pt presents with improving language impairment, now conceptualized as moderate to severe Wernicke's Aphasia (as per WAB-R, above).   Verbal Communication Pt's verbal communication continues to be fluent but increased number of islands of intelligible speech  especially when commenting.   Auditory Comprehension Pt with improved auditory comprehension as evidenced by his ability to select objects in field of 4, answer basic to progressing semi-complex yes/no questions and following 1 step directions.   Written Language Pt with improved letter recognition and ability to copy written words.   Reading Comprehension Continues to be a strength for pt. Utilized to supplement receptive language deficits.   See the above treatment note for details.   OBJECTIVE IMPAIRMENTS include expressive language, receptive language, aphasia, apraxia, and written language. These impairments are limiting patient from managing medications, managing appointments, managing finances, household responsibilities, ADLs/IADLs, and effectively communicating at home and in community. Factors affecting potential to achieve goals and functional outcome are severity of impairments, family/community support, and significantly reduced health literacy. Patient will benefit from skilled SLP services to address above impairments and improve overall function.  REHAB POTENTIAL: Good  PLAN: SLP FREQUENCY: 1-2x/week  SLP DURATION: 12 weeks  PLANNED INTERVENTIONS:  Language facilitation, Cueing hierachy, Internal/external aids, Functional tasks, Multimodal communication approach, SLP instruction and feedback, Compensatory strategies, and Patient/family education  Hassell Roys, SLP Student Clinician  Happi B. Rubbie, M.S., CCC-SLP, Tree surgeon Certified Brain Injury Specialist [facility]  [facility] Rehabilitation Services Office [phone] Ascom [phone] Fax [phone]

## 2023-12-13 ENCOUNTER — Ambulatory Visit: Admitting: Speech Pathology

## 2023-12-17 ENCOUNTER — Other Ambulatory Visit (INDEPENDENT_AMBULATORY_CARE_PROVIDER_SITE_OTHER): Payer: Self-pay | Admitting: Vascular Surgery

## 2023-12-17 DIAGNOSIS — I739 Peripheral vascular disease, unspecified: Secondary | ICD-10-CM

## 2023-12-18 ENCOUNTER — Ambulatory Visit: Admitting: Speech Pathology

## 2023-12-18 ENCOUNTER — Encounter: Admitting: Occupational Therapy

## 2023-12-18 DIAGNOSIS — R482 Apraxia: Secondary | ICD-10-CM

## 2023-12-18 DIAGNOSIS — R4701 Aphasia: Secondary | ICD-10-CM | POA: Diagnosis not present

## 2023-12-18 NOTE — Therapy (Signed)
 OUTPATIENT SPEECH LANGUAGE PATHOLOGY  SPEECH LANGUAGE TREATMENT NOTE   Patient Name: Gregory Lane MRN: [id number] DOB:Sep 10, 1950, 73 y.o., male 58 Date: 12/18/2023   PCP: Gregory DELENA Bathe, MD REFERRING PROVIDER: Arland Earnie Pouch, MD    End of Session - 12/18/23 1102     Visit Number 28    Number of Visits 44    Date for Recertification  02/07/24    Authorization Type [employer] [place]    Progress Note Due on Visit 30    SLP Start Time [phone]    SLP Stop Time  1015    SLP Time Calculation (min) 39 min    Activity Tolerance Patient tolerated treatment well          Past Medical History:  Diagnosis Date   Anxiety    Aortic atherosclerosis 02/16/2017   Arthritis    Blind left eye    COPD (chronic obstructive pulmonary disease) (HCC)    Depression    Dysrhythmia    A-fib   GERD (gastroesophageal reflux disease)    Hypertension    Low TSH level 02/17/2017   Myocardial infarction [facility])    PAD (peripheral artery disease)    Paroxysmal atrial fibrillation (HCC)    Perforated duodenal ulcer (HCC)    Stroke (HCC)    left side is weak   Past Surgical History:  Procedure Laterality Date   BRONCHIAL BIOPSY  03/22/2022   Procedure: BRONCHIAL BIOPSIES;  Surgeon: Isadora Hose, MD;  [facility]: [facility];  Service: Pulmonary;;   BRONCHIAL NEEDLE ASPIRATION BIOPSY  03/22/2022   Procedure: BRONCHIAL NEEDLE ASPIRATION BIOPSIES;  Surgeon: Isadora Hose, MD;  Location: [facility];  Service: Pulmonary;;   ENDOBRONCHIAL ULTRASOUND  03/22/2022   Procedure: ENDOBRONCHIAL ULTRASOUND;  Surgeon: Isadora Hose, MD;  Location: [facility];  Service: Pulmonary;;   EXPLORATORY LAPAROTOMY  05/12/2015   EYE SURGERY     FRACTURE SURGERY     LAPAROTOMY N/A 05/10/2015   Procedure: EXPLORATORY LAPAROTOMY;  Surgeon: Lynda Leos, MD;  Location: [facility] OR;  Service: General;  Laterality: N/A;   LEFT HEART CATH AND CORONARY ANGIOGRAPHY N/A 05/02/2016   Procedure: Left Heart Cath and  Coronary Angiography;  Surgeon: Gordy Bergamo, MD;  Location: [facility] INVASIVE CV LAB;  Service: Cardiovascular;  Laterality: N/A;   LOWER EXTREMITY ANGIOGRAPHY Left 09/21/2021   Procedure: Lower Extremity Angiography;  Surgeon: Jama Cordella KANDICE, MD;  Location: [facility] INVASIVE CV LAB;  Service: Cardiovascular;  Laterality: Left;   Patient Active Problem List   Diagnosis Date Noted   Allergic rhinitis due to pollen 12/11/2022   Tinea cruris 12/11/2022   BPH associated with nocturia 12/11/2022   Chest pain, non-cardiac 04/11/2022   Adenocarcinoma of lower lobe of right lung (HCC) 03/28/2022   Pulmonary nodule 1 cm or greater in diameter 03/22/2022   Leg weakness, bilateral 02/09/2022   Osteopenia 10/13/2021   PAD (peripheral artery disease) 09/21/2021   GERD without esophagitis 09/21/2021   Dyslipidemia 09/21/2021   Depression 09/21/2021   Atrial flutter (HCC) 09/21/2021   Chronic obstructive pulmonary disease (COPD) (HCC) 09/21/2021   Coronary artery disease 09/21/2021   Cerebrovascular accident (CVA) due to embolism of cerebral artery (HCC) 09/06/2021   Bilateral edema of lower extremity 08/05/2020   Vasovagal syncope 08/02/2020   Annual physical exam 12/26/2019   Hyperlipidemia 12/26/2019   Abrasion of right hand 12/26/2019   Metabolic syndrome 11/11/2019   Low TSH level 02/17/2017   Atrial flutter with rapid ventricular response (HCC) 02/16/2017   Cigarette  smoker 02/16/2017   COPD with acute exacerbation (HCC) 02/16/2017   Elevated lactic acid level 02/16/2017   Aortic atherosclerosis 02/16/2017   Productive cough    Hypoxia 05/24/2015   SOB (shortness of breath) 05/24/2015   COPD suggested by initial evaluation 05/17/2015   Essential hypertension 05/17/2015   Smoking 05/17/2015    ONSET DATE: 08/09/2023   REFERRING DIAG: I63.9 (ICD-10-CM) - Cerebral infarction, unspecified   THERAPY DIAG:  Aphasia  Apraxia  Rationale for Evaluation and Treatment  Rehabilitation  SUBJECTIVE:   PERTINENT HISTORY and DIAGNOSTIC FINDINGS: Pt is a 73 year old right handed male who presented to [facility] ED on 08/09/2023 with stroke-like symptoms - slurred speech and difficulty answering questions and weakness to his right side extremities. CT head and subsequent CTA head and neck shows left M2 occlusion with large penumbra in the region. Pt transferred to [facility] for mechanical thrombectomy on 08/10/2023. Pt admitted at [facility] from 08/10/2023 thru 08/21/2023 with recommended for home health.   MRI brain: Remonstrated left MCA territory infarct primarily involving the parietal lobe.   Additional medical history includes: blindness left eye, A-fib on warfarin, COPD, HFpEF, HTN, current smoker, ethanol abuse, RLL adenocarcinoma s/p SBRT (2024)    PAIN:  Are you having pain? Unable to communicate  FALLS: Has patient fallen in last 6 months?  No  LIVING ENVIRONMENT: Lives with: sister-in-law Lives in: House/apartment  PLOF:  Level of assistance: Independent with ADLs, Independent with IADLs, Comment: poor health literacy, difficulty with reading and writing per family report Employment: Retired   PATIENT GOALS    per pt's sister-in-law, she hopes he will talk again  SUBJECTIVE STATEMENT: Pt engaged, brought in his homework Pt accompanied by: sister-in-law  OBJECTIVE:   TODAY'S TREATMENT:  Skilled ST treatment session targeted pt's cognitive communication, aphasia and apraxia of speech goals. SLP facilitated session by providing the following interventions:  To target written comprehension, category identification, and reasoning: Just for Adults Concrete Categories - pp 25-26: Once conditioned to the task, pt independently marked the word that did not belong from a field of 4 in 15 out of 30 trials. Improved to 26 out of 30 given mod to max A. Pt benefited from having the list of words written and the category named above.   PATIENT EDUCATION: Education  details: see above Person educated: Patient and his sister-in-law Education method: Explanation Education comprehension: needs further education  HOME EXERCISE PROGRAM:   Supplement verbal information with single written words  GOALS:  Goals reviewed with patient? Yes  SHORT TERM GOALS: Target date: 10 sessions  Updated: 11/15/2023 Updated: 10/10/2023 With maximal assistance, pt will select written word to express request for food/drink item in 7 out of 10 opportunities.  Baseline: Goal status: INITIAL: MET  2.  With maximal multimodal assistance, pt will answer yes/no questions in 8 out of 10 opportunities.  Baseline:  Goal status: INITIAL: Is able to answer using written language when choosing between written yes/no: MET  3.  With maximal multimodal assistance, pt will verbally approximate common object names in 5 out of 10 opportunities.  Baseline:  Goal status: INITIAL: progress made: MET  4. With minimal assistance, pt will point to 1 common object by function in a field of 3 with 90% accuracy.  Baseline:  Goal status: INITIAL  5. With minimal to moderate assistance, pt will point, in sequence to 2 common objects by function in field of 3 with 90% accuracy.   Baseline:  Goal status: INITIAL  6. With minimal to moderate assistance pt with verablly approximate names of basic functional objects with > 75% speech intelligiblity at the word level.    Baseline:  Goal status: INITIAL  7. With minimal assistance, pt will identify body parts with 90% accuracy.   Baseline:  Goal status: INITIAL  8. With moderate assistance, pt will follow 1 step directions with 75% accuracy. Baseline:  Goal status: INITIAL  LONG TERM GOALS: Target date: 11/20/2023  Updated: 11/15/2023 Updated: 10/10/2023 Pt will use multimodal communication and maximal assistance to communicate basic wants and needs in 2 out of 5 opportunities per pt and family report.  Baseline:  Goal status:  INITIAL: progress, continue targeting  2.  With Maximal A, patient/family will demonstrate understanding of the following concepts: aphasia, spontaneous recovery, communication vs conversation, strengths/strategies to promote success, local resources by answering multiple choice questions with 50% accuracy when provided supported conversation in order to increase patient and caregiver's participation in medical care.  Baseline:  Goal status: INITIAL: progress, continue targeting   ASSESSMENT:  CLINICAL IMPRESSION: Patient is a 73 y.o. right handed male who was seen today for a speech language treatment d/t left MCA CVA.   Pt presents with improving language impairment, now conceptualized as moderate to severe Wernicke's Aphasia (as per WAB-R, above).   Verbal Communication Pt's verbal communication continues to be fluent but increased number of islands of intelligible speech especially when commenting.   Auditory Comprehension Pt with improved auditory comprehension as evidenced by his ability to select objects in field of 4, answer basic to progressing semi-complex yes/no questions and following 1 step directions.   Written Language Pt with improved letter recognition and ability to copy written words.   Reading Comprehension Continues to be a strength for pt. Utilized to supplement receptive language deficits.   See the above treatment note for details.   OBJECTIVE IMPAIRMENTS include expressive language, receptive language, aphasia, apraxia, and written language. These impairments are limiting patient from managing medications, managing appointments, managing finances, household responsibilities, ADLs/IADLs, and effectively communicating at home and in community. Factors affecting potential to achieve goals and functional outcome are severity of impairments, family/community support, and significantly reduced health literacy. Patient will benefit from skilled SLP services to address  above impairments and improve overall function.  REHAB POTENTIAL: Good  PLAN: SLP FREQUENCY: 1-2x/week  SLP DURATION: 12 weeks  PLANNED INTERVENTIONS: Language facilitation, Cueing hierachy, Internal/external aids, Functional tasks, Multimodal communication approach, SLP instruction and feedback, Compensatory strategies, and Patient/family education  Hassell Roys, SLP Student Clinician  Happi B. Rubbie, M.S., CCC-SLP, Tree Surgeon Certified Brain Injury Specialist [facility]  [facility] Rehabilitation Services Office [phone] Ascom [phone] Fax [phone]

## 2023-12-19 ENCOUNTER — Ambulatory Visit (INDEPENDENT_AMBULATORY_CARE_PROVIDER_SITE_OTHER): Admitting: Nurse Practitioner

## 2023-12-19 ENCOUNTER — Encounter (INDEPENDENT_AMBULATORY_CARE_PROVIDER_SITE_OTHER): Payer: Self-pay | Admitting: Nurse Practitioner

## 2023-12-19 ENCOUNTER — Other Ambulatory Visit (INDEPENDENT_AMBULATORY_CARE_PROVIDER_SITE_OTHER)

## 2023-12-19 VITALS — BP 190/85 | HR 50 | Resp 18 | Wt 194.8 lb

## 2023-12-19 DIAGNOSIS — Z9889 Other specified postprocedural states: Secondary | ICD-10-CM | POA: Diagnosis not present

## 2023-12-19 DIAGNOSIS — I739 Peripheral vascular disease, unspecified: Secondary | ICD-10-CM

## 2023-12-19 DIAGNOSIS — I729 Aneurysm of unspecified site: Secondary | ICD-10-CM

## 2023-12-19 DIAGNOSIS — I10 Essential (primary) hypertension: Secondary | ICD-10-CM

## 2023-12-20 ENCOUNTER — Ambulatory Visit: Admitting: Speech Pathology

## 2023-12-20 ENCOUNTER — Encounter: Admitting: Occupational Therapy

## 2023-12-20 DIAGNOSIS — R4701 Aphasia: Secondary | ICD-10-CM

## 2023-12-20 DIAGNOSIS — R482 Apraxia: Secondary | ICD-10-CM

## 2023-12-20 NOTE — Therapy (Unsigned)
 OUTPATIENT SPEECH LANGUAGE PATHOLOGY  SPEECH LANGUAGE TREATMENT NOTE   Patient Name: Gregory Lane MRN: [id number] DOB:[birth year], 73 y.o., male 3 Date: 12/20/2023   PCP: Denyse DELENA Bathe, MD REFERRING PROVIDER: Arland Earnie Pouch, MD    End of Session - 12/20/23 1403     Visit Number 29    Number of Visits 44    Date for Recertification  02/07/24    Authorization Type [employer] [place]    Progress Note Due on Visit 30    SLP Start Time 1305    SLP Stop Time  1345    SLP Time Calculation (min) 40 min    Activity Tolerance Patient tolerated treatment well          Past Medical History:  Diagnosis Date   Anxiety    Aortic atherosclerosis 02/16/2017   Arthritis    Blind left eye    COPD (chronic obstructive pulmonary disease) (HCC)    Depression    Dysrhythmia    A-fib   GERD (gastroesophageal reflux disease)    Hypertension    Low TSH level 02/17/2017   Myocardial infarction [facility])    PAD (peripheral artery disease)    Paroxysmal atrial fibrillation (HCC)    Perforated duodenal ulcer (HCC)    Stroke (HCC)    left side is weak   Past Surgical History:  Procedure Laterality Date   BRONCHIAL BIOPSY  03/22/2022   Procedure: BRONCHIAL BIOPSIES;  Surgeon: Isadora Hose, MD;  [facility]: [facility];  Service: Pulmonary;;   BRONCHIAL NEEDLE ASPIRATION BIOPSY  03/22/2022   Procedure: BRONCHIAL NEEDLE ASPIRATION BIOPSIES;  Surgeon: Isadora Hose, MD;  [facility]: [facility];  Service: Pulmonary;;   ENDOBRONCHIAL ULTRASOUND  03/22/2022   Procedure: ENDOBRONCHIAL ULTRASOUND;  Surgeon: Isadora Hose, MD;  [facility]: [facility];  Service: Pulmonary;;   EXPLORATORY LAPAROTOMY  05/12/2015   EYE SURGERY     FRACTURE SURGERY     LAPAROTOMY N/A 05/10/2015   Procedure: EXPLORATORY LAPAROTOMY;  Surgeon: Lynda Leos, MD;  Location: [facility];  Service: General;  Laterality: N/A;   LEFT HEART CATH AND CORONARY ANGIOGRAPHY N/A 05/02/2016   Procedure: Left Heart Cath and  Coronary Angiography;  Surgeon: Gordy Bergamo, MD;  Location: [facility] INVASIVE CV LAB;  Service: Cardiovascular;  Laterality: N/A;   LOWER EXTREMITY ANGIOGRAPHY Left 09/21/2021   Procedure: Lower Extremity Angiography;  Surgeon: Jama Cordella KANDICE, MD;  Location: [facility] INVASIVE CV LAB;  Service: Cardiovascular;  Laterality: Left;   Patient Active Problem List   Diagnosis Date Noted   Allergic rhinitis due to pollen 12/11/2022   Tinea cruris 12/11/2022   BPH associated with nocturia 12/11/2022   Chest pain, non-cardiac 04/11/2022   Adenocarcinoma of lower lobe of right lung (HCC) 03/28/2022   Pulmonary nodule 1 cm or greater in diameter 03/22/2022   Leg weakness, bilateral 02/09/2022   Osteopenia 10/13/2021   PAD (peripheral artery disease) 09/21/2021   GERD without esophagitis 09/21/2021   Dyslipidemia 09/21/2021   Depression 09/21/2021   Atrial flutter (HCC) 09/21/2021   Chronic obstructive pulmonary disease (COPD) (HCC) 09/21/2021   Coronary artery disease 09/21/2021   Cerebrovascular accident (CVA) due to embolism of cerebral artery (HCC) 09/06/2021   Bilateral edema of lower extremity 08/05/2020   Vasovagal syncope 08/02/2020   Annual physical exam 12/26/2019   Hyperlipidemia 12/26/2019   Abrasion of right hand 12/26/2019   Metabolic syndrome 11/11/2019   Low TSH level 02/17/2017   Atrial flutter with rapid ventricular response (HCC) 02/16/2017   Cigarette  smoker 02/16/2017   COPD with acute exacerbation (HCC) 02/16/2017   Elevated lactic acid level 02/16/2017   Aortic atherosclerosis 02/16/2017   Productive cough    Hypoxia 05/24/2015   SOB (shortness of breath) 05/24/2015   COPD suggested by initial evaluation 05/17/2015   Essential hypertension 05/17/2015   Smoking 05/17/2015    ONSET DATE: 08/09/2023   REFERRING DIAG: I63.9 (ICD-10-CM) - Cerebral infarction, unspecified   THERAPY DIAG:  Aphasia  Apraxia  Rationale for Evaluation and Treatment  Rehabilitation  SUBJECTIVE:   PERTINENT HISTORY and DIAGNOSTIC FINDINGS: Pt is a 73 year old right handed male who presented to [facility] ED on 08/09/2023 with stroke-like symptoms - slurred speech and difficulty answering questions and weakness to his right side extremities. CT head and subsequent CTA head and neck shows left M2 occlusion with large penumbra in the region. Pt transferred to [facility] for mechanical thrombectomy on 08/10/2023. Pt admitted at [facility] from 08/10/2023 thru 08/21/2023 with recommended for home health.   MRI brain: Remonstrated left MCA territory infarct primarily involving the parietal lobe.   Additional medical history includes: blindness left eye, A-fib on warfarin, COPD, HFpEF, HTN, current smoker, ethanol abuse, RLL adenocarcinoma s/p SBRT (2024)    PAIN:  Are you having pain? Unable to communicate  FALLS: Has patient fallen in last 6 months?  No  LIVING ENVIRONMENT: Lives with: sister-in-law Lives in: House/apartment  PLOF:  Level of assistance: Independent with ADLs, Independent with IADLs, Comment: poor health literacy, difficulty with reading and writing per family report Employment: Retired   PATIENT GOALS    per pt's sister-in-law, she hopes he will talk again  SUBJECTIVE STATEMENT: Pt engaged, pleasant, reports I'm alright Pt accompanied by: sister-in-law  OBJECTIVE:   TODAY'S TREATMENT:  Skilled ST treatment session targeted pt's cognitive communication, aphasia and apraxia of speech goals. SLP facilitated session by providing the following interventions:  To increase comprehension skills, pictures of new everyday items paired with their words were presented in the following:  SLP verbally presented each picture and written word. Pt then said each word with the clinician. The last round consisted of pt saying word by self.  New everyday items: when verbalized with SLP: achieved 7 out of 10 trials independently, improved to 9 out of 10 given min A;  when verbalized by self: pt achieved 4 out of 10 trials, improved to 7 out of 10 given min to max A.   Given index card with pt's name, pt independently produced his first name intelligibly. Pt required max A (verbal and visual cues) to produce his last name correctly. Pt independently produced sister-in-laws name and dogs name - much improved.     Receptive Language further targeted thru: Stage 1 - Given 4 pictures of everyday items, pt asked to point to 1 object named. Pt independently achieved 6 out of 6 opportunities - much improved Stage 2 - Given 3 pictures of everyday items, pt asked to point to 1 object by function. Pt independently pointed to 8 out of 10 opportunities. Improved to 10 out of 10 given min to max A.    [employer] Therapy app Answering questions Level 2: Pt independently achieved 25 out of 37 trials. Improved to 36 out of 37 given min to max A (verbal and semantic cues).   PATIENT EDUCATION: Education details: see above Person educated: Patient and his sister-in-law Education method: Explanation Education comprehension: needs further education  HOME EXERCISE PROGRAM:   Supplement verbal information with single written words  GOALS:  Goals reviewed with patient? Yes  SHORT TERM GOALS: Target date: 10 sessions  Updated: 11/15/2023 Updated: 10/10/2023 With maximal assistance, pt will select written word to express request for food/drink item in 7 out of 10 opportunities.  Baseline: Goal status: INITIAL: MET  2.  With maximal multimodal assistance, pt will answer yes/no questions in 8 out of 10 opportunities.  Baseline:  Goal status: INITIAL: Is able to answer using written language when choosing between written yes/no: MET  3.  With maximal multimodal assistance, pt will verbally approximate common object names in 5 out of 10 opportunities.  Baseline:  Goal status: INITIAL: progress made: MET  4. With minimal assistance, pt will point to 1 common object by  function in a field of 3 with 90% accuracy.  Baseline:  Goal status: INITIAL  5. With minimal to moderate assistance, pt will point, in sequence to 2 common objects by function in field of 3 with 90% accuracy.   Baseline:  Goal status: INITIAL  6. With minimal to moderate assistance pt with verablly approximate names of basic functional objects with > 75% speech intelligiblity at the word level.    Baseline:  Goal status: INITIAL  7. With minimal assistance, pt will identify body parts with 90% accuracy.   Baseline:  Goal status: INITIAL  8. With moderate assistance, pt will follow 1 step directions with 75% accuracy. Baseline:  Goal status: INITIAL  LONG TERM GOALS: Target date: 11/20/2023  Updated: 11/15/2023 Updated: 10/10/2023 Pt will use multimodal communication and maximal assistance to communicate basic wants and needs in 2 out of 5 opportunities per pt and family report.  Baseline:  Goal status: INITIAL: progress, continue targeting  2.  With Maximal A, patient/family will demonstrate understanding of the following concepts: aphasia, spontaneous recovery, communication vs conversation, strengths/strategies to promote success, local resources by answering multiple choice questions with 50% accuracy when provided supported conversation in order to increase patient and caregiver's participation in medical care.  Baseline:  Goal status: INITIAL: progress, continue targeting   ASSESSMENT:  CLINICAL IMPRESSION: Patient is a 73 y.o. right handed male who was seen today for a speech language treatment d/t left MCA CVA.   Pt presents with improving language impairment, now conceptualized as moderate to severe Wernicke's Aphasia (as per WAB-R, above).   Verbal Communication Pt's verbal communication continues to be fluent but increased number of islands of intelligible speech especially when commenting.   Auditory Comprehension Pt with improved auditory comprehension as  evidenced by his ability to select objects in field of 4, answer basic to progressing semi-complex yes/no questions and following 1 step directions.   Written Language Pt with improved letter recognition and ability to copy written words.   Reading Comprehension Continues to be a strength for pt. Utilized to supplement receptive language deficits.   See the above treatment note for details.   OBJECTIVE IMPAIRMENTS include expressive language, receptive language, aphasia, apraxia, and written language. These impairments are limiting patient from managing medications, managing appointments, managing finances, household responsibilities, ADLs/IADLs, and effectively communicating at home and in community. Factors affecting potential to achieve goals and functional outcome are severity of impairments, family/community support, and significantly reduced health literacy. Patient will benefit from skilled SLP services to address above impairments and improve overall function.  REHAB POTENTIAL: Good  PLAN: SLP FREQUENCY: 1-2x/week  SLP DURATION: 12 weeks  PLANNED INTERVENTIONS: Language facilitation, Cueing hierachy, Internal/external aids, Functional tasks, Multimodal communication approach, SLP instruction and feedback, Compensatory strategies, and  Patient/family education  Hassell Roys, SLP Student Clinician  Happi B. Rubbie, M.S., CCC-SLP, Tree Surgeon Certified Brain Injury Specialist [facility]  [facility] Rehabilitation Services Office [phone] Ascom [phone] Fax [phone]

## 2023-12-24 ENCOUNTER — Encounter (INDEPENDENT_AMBULATORY_CARE_PROVIDER_SITE_OTHER): Payer: Self-pay | Admitting: Nurse Practitioner

## 2023-12-24 NOTE — Progress Notes (Signed)
 Subjective:    Patient ID: Gregory Lane, male    DOB: May 12, 1950, 73 y.o.   MRN: [id number] Chief Complaint  Patient presents with   Follow-up    Ref Stoioff follow up in 6-8 weeks with a right groin duplex Possible thrombosed pseudoaneurysm or hematoma along the anterior wall of the right lower common femoral vein/proximal superficial femoral vein measuring 2.6 by 2.2 cm    The patient presents today after an MRI noted a 2.6 x 2.2 mass in the patient's right groin.  He had a previous scan in 2024 and this was not noted at that time.  He currently denies any pain or additional open wounds or ulcerations.  He denies any recent procedures that may have caused this pseudoaneurysm or hematoma.  He currently notes that he does not have any pain or notable swelling in the area.  He does not have any fevers or chills.  He did undergo an angiogram in 2023 but again the pseudoaneurysm is not only with scans in 2024.  Today he does not have an active pseudoaneurysm or DVT noted on ultrasound.  The mass itself is noted but he currently has no flow seen within.  It is decreased in size to 2.42 x 0.73 centimeters    Review of Systems  All other systems reviewed and are negative.      Objective:   Physical Exam Vitals reviewed.  HENT:     Head: Normocephalic.  Cardiovascular:     Rate and Rhythm: Normal rate.  Pulmonary:     Effort: Pulmonary effort is normal.  Skin:    General: Skin is warm and dry.  Neurological:     Mental Status: He is alert and oriented to person, place, and time.  Psychiatric:        Mood and Affect: Mood normal.        Behavior: Behavior normal.        Thought Content: Thought content normal.        Judgment: Judgment normal.     BP (!) 190/85   Pulse (!) 50   Resp 18   Wt 194 lb 12.8 oz (88.4 kg)   BMI 25.70 kg/m   Past Medical History:  Diagnosis Date   Anxiety    Aortic atherosclerosis 02/16/2017   Arthritis    Blind left eye    COPD (chronic  obstructive pulmonary disease) (HCC)    Depression    Dysrhythmia    A-fib   GERD (gastroesophageal reflux disease)    Hypertension    Low TSH level 02/17/2017   Myocardial infarction [facility])    PAD (peripheral artery disease)    Paroxysmal atrial fibrillation (HCC)    Perforated duodenal ulcer (HCC)    Stroke (HCC)    left side is weak    Social History   Socioeconomic History   Marital status: Single    Spouse name: Not on file   Number of children: Not on file   Years of education: Not on file   Highest education level: Not on file  Occupational History   Not on file  Tobacco Use   Smoking status: Every Day    Current packs/day: 0.50    Average packs/day: 0.5 packs/day for 65.0 years (32.5 ttl pk-yrs)    Types: Cigarettes   Smokeless tobacco: Never   Tobacco comments:    0.5 PPD  Vaping Use   Vaping status: Never Used  Substance and Sexual Activity   Alcohol use:  Yes    Alcohol/week: 60.0 standard drinks of alcohol    Types: 60 Standard drinks or equivalent per week    Comment: daily 12 pk of beer   Drug use: No   Sexual activity: Not Currently  Other Topics Concern   Not on file  Social History Narrative   [employer] [employer]. Lives with sister and sister-in-law   Social Drivers of Health   Financial Resource Strain: Low Risk  (12/11/2023)   Received from [facility]   Overall Financial Resource Strain (CARDIA)    Difficulty of Paying Living Expenses: Not very hard  Food Insecurity: Food Insecurity Present (12/11/2023)   Received from [facility]   Hunger Vital Sign    Within the past 12 months, you worried that your food would run out before you got the money to buy more.: Often true    Within the past 12 months, the food you bought just didn't last and you didn't have money to get more.: Sometimes true  Transportation Needs: No Transportation Needs (12/11/2023)   Received from [facility] -  Transportation    In the past 12 months, has lack of transportation kept you from medical appointments or from getting medications?: No    Lack of Transportation (Non-Medical): No  Physical Activity: Inactive (01/23/2022)   Exercise Vital Sign    Days of Exercise per Week: 0 days    Minutes of Exercise per Session: 0 min  Stress: No Stress Concern Present (09/09/2021)   [employer] of Occupational [employer] - Occupational Stress Questionnaire    Feeling of Stress : Only a little  Social Connections: Socially Isolated (09/09/2021)   Social Connection and Isolation Panel    Frequency of Communication with Friends and Family: More than three times a week    Frequency of Social Gatherings with Friends and Family: More than three times a week    Attends Religious Services: Never    Database Administrator or Organizations: No    Attends Banker Meetings: Never    Marital Status: Never married  Intimate Partner Violence: Not At Risk (03/28/2022)   Humiliation, Afraid, Rape, and Kick questionnaire    Fear of Current or Ex-Partner: No    Emotionally Abused: No    Physically Abused: No    Sexually Abused: No    Past Surgical History:  Procedure Laterality Date   BRONCHIAL BIOPSY  03/22/2022   Procedure: BRONCHIAL BIOPSIES;  Surgeon: Isadora Hose, MD;  [facility];  Service: Pulmonary;;   BRONCHIAL NEEDLE ASPIRATION BIOPSY  03/22/2022   Procedure: BRONCHIAL NEEDLE ASPIRATION BIOPSIES;  Surgeon: Isadora Hose, MD;  Location: [facility];  Service: Pulmonary;;   ENDOBRONCHIAL ULTRASOUND  03/22/2022   Procedure: ENDOBRONCHIAL ULTRASOUND;  Surgeon: Isadora Hose, MD;  Location: [facility];  Service: Pulmonary;;   EXPLORATORY LAPAROTOMY  05/12/2015   EYE SURGERY     FRACTURE SURGERY     LAPAROTOMY N/A 05/10/2015   Procedure: EXPLORATORY LAPAROTOMY;  Surgeon: Lynda Leos, MD;  Location: [facility] OR;  Service: General;  Laterality: N/A;   LEFT HEART CATH AND CORONARY  ANGIOGRAPHY N/A 05/02/2016   Procedure: Left Heart Cath and Coronary Angiography;  Surgeon: Gordy Bergamo, MD;  Location: [facility] INVASIVE CV LAB;  Service: Cardiovascular;  Laterality: N/A;   LOWER EXTREMITY ANGIOGRAPHY Left 09/21/2021   Procedure: Lower Extremity Angiography;  Surgeon: Jama Cordella MATSU, MD;  Location: [facility] INVASIVE CV LAB;  Service: Cardiovascular;  Laterality: Left;  Family History  Problem Relation Age of Onset   Hypertension Mother    Dementia Mother    Cirrhosis Father    Cancer Father    Lung cancer Sister    Hypertension Sister    Arthritis Sister    Heart murmur Sister    Arrhythmia Sister     No Known Allergies     Latest Ref Rng & Units 08/22/2023    3:51 PM 08/09/2023    9:50 PM 07/10/2023    4:08 PM  CBC  WBC 4.0 - 10.5 K/uL 6.9  4.8  5.6   Hemoglobin 13.0 - 17.0 g/dL 88.7  86.4  85.4   Hematocrit 39.0 - 52.0 % 32.8  40.0  41.0   Platelets 150 - 400 K/uL 298  173  224       CMP     Component Value Date/Time   NA 137 08/22/2023 1551   NA 136 07/10/2023 1608   K 4.0 08/22/2023 1551   CL 104 08/22/2023 1551   CO2 24 08/22/2023 1551   GLUCOSE 121 (H) 08/22/2023 1551   BUN 28 (H) 08/22/2023 1551   BUN 8 07/10/2023 1608   CREATININE 0.88 08/22/2023 1551   CREATININE 0.91 12/19/2019 1114   CALCIUM  8.4 (L) 08/22/2023 1551   PROT 7.0 08/22/2023 1551   PROT 6.5 07/10/2023 1608   ALBUMIN 3.2 (L) 08/22/2023 1551   ALBUMIN 3.9 07/10/2023 1608   AST 27 08/22/2023 1551   ALT 42 08/22/2023 1551   ALKPHOS 95 08/22/2023 1551   BILITOT 0.7 08/22/2023 1551   BILITOT 0.3 07/10/2023 1608   EGFR 97 07/10/2023 1608   GFRNONAA >60 08/22/2023 1551   GFRNONAA 84 12/19/2019 1114     No results found.     Assessment & Plan:   1. Pseudoaneurysm (Primary) The patient had either a pseudoaneurysm or hematoma but does not appear to have any flow today.  In comparison to the MRI that he had it is smaller in nature.  Today it measures 2.42 x 0.73 cm.  Based on this  the mass itself is decreasing in size which is consistent with either hematoma or pseudoaneurysm.  There is no flow seen within such at this time.  No further intervention is required at this time.  Will have to return in 6 months in order to repeat studies.  2. Essential hypertension Continue antihypertensive medications as already ordered, these medications have been reviewed and there are no changes at this time.   Current Outpatient Medications on File Prior to Visit  Medication Sig Dispense Refill   apixaban  (ELIQUIS ) 5 MG TABS tablet Take 1 tablet (5 mg total) by mouth 2 (two) times daily. 60 tablet 3   atorvastatin  (LIPITOR) 40 MG tablet TAKE 1 TABLET BY MOUTH EVERY DAY 90 tablet 3   cetirizine  (ZYRTEC ) 10 MG tablet TAKE 1 TABLET BY MOUTH EVERY DAY 90 tablet 1   FLUoxetine  (PROZAC ) 20 MG capsule Take 2 capsules (40 mg total) by mouth daily. 180 capsule 0   losartan  (COZAAR ) 50 MG tablet Take 1 tablet (50 mg total) by mouth 2 (two) times daily. 60 tablet 11   pantoprazole  (PROTONIX ) 40 MG tablet Take 1 tablet (40 mg total) by mouth 2 (two) times daily. 180 tablet 1   fluticasone  (FLONASE ) 50 MCG/ACT nasal spray Place 1 spray into both nostrils daily. (Patient not taking: Reported on 12/19/2023) 16 mL 1   furosemide  (LASIX ) 20 MG tablet Take 20 mg by mouth daily. (Patient  not taking: Reported on 12/19/2023)     KLOR-CON  M20 20 MEQ tablet Take 20 mEq by mouth 2 (two) times daily. (Patient not taking: Reported on 12/19/2023)     No current facility-administered medications on file prior to visit.    There are no Patient Instructions on file for this visit. No follow-ups on file.   Aerilynn Goin E Aariah Godette, NP

## 2023-12-25 ENCOUNTER — Ambulatory Visit: Attending: Internal Medicine | Admitting: Speech Pathology

## 2023-12-25 ENCOUNTER — Encounter: Admitting: Occupational Therapy

## 2023-12-25 DIAGNOSIS — R4701 Aphasia: Secondary | ICD-10-CM | POA: Diagnosis present

## 2023-12-25 DIAGNOSIS — R482 Apraxia: Secondary | ICD-10-CM | POA: Diagnosis present

## 2023-12-25 NOTE — Therapy (Unsigned)
 OUTPATIENT SPEECH LANGUAGE PATHOLOGY  SPEECH LANGUAGE TREATMENT NOTE  PROGRESS NOTE  Patient Name: Gregory Lane MRN: [id number] DOB:12-31-1950, 73 y.o., male Today's Date: 12/25/2023  Speech Therapy Progress Note  Dates of Reporting Period: 11/16/2023 to 12/25/2023  Objective: Patient has been seen for 10 speech therapy sessions this reporting period targeting Wernicke's Aphasia. Patient is making progress toward LTGs and met 2 STGs this reporting period. See skilled intervention, clinical impressions, and goals below for details.   PCP: Denyse DELENA Bathe, MD REFERRING PROVIDER: Arland Earnie Pouch, MD    End of Session - 12/25/23 1519     Visit Number 30    Number of Visits 44    Date for Recertification  02/07/24    Authorization Type [employer] [facility] [place]    Progress Note Due on Visit 30    SLP Start Time 1400    SLP Stop Time  1445    SLP Time Calculation (min) 45 min    Activity Tolerance Patient tolerated treatment well          Past Medical History:  Diagnosis Date   Anxiety    Aortic atherosclerosis 02/16/2017   Arthritis    Blind left eye    COPD (chronic obstructive pulmonary disease) (HCC)    Depression    Dysrhythmia    A-fib   GERD (gastroesophageal reflux disease)    Hypertension    Low TSH level 02/17/2017   Myocardial infarction [facility])    PAD (peripheral artery disease)    Paroxysmal atrial fibrillation (HCC)    Perforated duodenal ulcer (HCC)    Stroke (HCC)    left side is weak   Past Surgical History:  Procedure Laterality Date   BRONCHIAL BIOPSY  03/22/2022   Procedure: BRONCHIAL BIOPSIES;  Surgeon: Isadora Hose, MD;  [facility]: [facility];  Service: Pulmonary;;   BRONCHIAL NEEDLE ASPIRATION BIOPSY  03/22/2022   Procedure: BRONCHIAL NEEDLE ASPIRATION BIOPSIES;  Surgeon: Isadora Hose, MD;  Location: [facility];  Service: Pulmonary;;   ENDOBRONCHIAL ULTRASOUND  03/22/2022   Procedure: ENDOBRONCHIAL ULTRASOUND;  Surgeon: Isadora Hose,  MD;  Location: [facility];  Service: Pulmonary;;   EXPLORATORY LAPAROTOMY  05/12/2015   EYE SURGERY     FRACTURE SURGERY     LAPAROTOMY N/A 05/10/2015   Procedure: EXPLORATORY LAPAROTOMY;  Surgeon: Lynda Leos, MD;  Location: [facility] OR;  Service: General;  Laterality: N/A;   LEFT HEART CATH AND CORONARY ANGIOGRAPHY N/A 05/02/2016   Procedure: Left Heart Cath and Coronary Angiography;  Surgeon: Gordy Bergamo, MD;  Location: [facility] INVASIVE CV LAB;  Service: Cardiovascular;  Laterality: N/A;   LOWER EXTREMITY ANGIOGRAPHY Left 09/21/2021   Procedure: Lower Extremity Angiography;  Surgeon: Jama Cordella KANDICE, MD;  Location: [facility] INVASIVE CV LAB;  Service: Cardiovascular;  Laterality: Left;   Patient Active Problem List   Diagnosis Date Noted   Allergic rhinitis due to pollen 12/11/2022   Tinea cruris 12/11/2022   BPH associated with nocturia 12/11/2022   Chest pain, non-cardiac 04/11/2022   Adenocarcinoma of lower lobe of right lung (HCC) 03/28/2022   Pulmonary nodule 1 cm or greater in diameter 03/22/2022   Leg weakness, bilateral 02/09/2022   Osteopenia 10/13/2021   PAD (peripheral artery disease) 09/21/2021   GERD without esophagitis 09/21/2021   Dyslipidemia 09/21/2021   Depression 09/21/2021   Atrial flutter (HCC) 09/21/2021   Chronic obstructive pulmonary disease (COPD) (HCC) 09/21/2021   Coronary artery disease 09/21/2021   Cerebrovascular accident (CVA) due to embolism of cerebral artery (HCC)  09/06/2021   Bilateral edema of lower extremity 08/05/2020   Vasovagal syncope 08/02/2020   Annual physical exam 12/26/2019   Hyperlipidemia 12/26/2019   Abrasion of right hand 12/26/2019   Metabolic syndrome 11/11/2019   Low TSH level 02/17/2017   Atrial flutter with rapid ventricular response (HCC) 02/16/2017   Cigarette smoker 02/16/2017   COPD with acute exacerbation (HCC) 02/16/2017   Elevated lactic acid level 02/16/2017   Aortic atherosclerosis 02/16/2017   Productive cough    Hypoxia  05/24/2015   SOB (shortness of breath) 05/24/2015   COPD suggested by initial evaluation 05/17/2015   Essential hypertension 05/17/2015   Smoking 05/17/2015    ONSET DATE: 08/09/2023   REFERRING DIAG: I63.9 (ICD-10-CM) - Cerebral infarction, unspecified   THERAPY DIAG:  Aphasia  Apraxia  Rationale for Evaluation and Treatment Rehabilitation  SUBJECTIVE:   PERTINENT HISTORY and DIAGNOSTIC FINDINGS: Pt is a 73 year old right handed male who presented to [facility] ED on 08/09/2023 with stroke-like symptoms - slurred speech and difficulty answering questions and weakness to his right side extremities. CT head and subsequent CTA head and neck shows left M2 occlusion with large penumbra in the region. Pt transferred to [facility] for mechanical thrombectomy on 08/10/2023. Pt admitted at [facility] from 08/10/2023 thru 08/21/2023 with recommended for home health.   MRI brain: Remonstrated left MCA territory infarct primarily involving the parietal lobe.   Additional medical history includes: blindness left eye, A-fib on warfarin, COPD, HFpEF, HTN, current smoker, ethanol abuse, RLL adenocarcinoma s/p SBRT (2024)    PAIN:  Are you having pain? Unable to communicate  FALLS: Has patient fallen in last 6 months?  No  LIVING ENVIRONMENT: Lives with: sister-in-law Lives in: House/apartment  PLOF:  Level of assistance: Independent with ADLs, Independent with IADLs, Comment: poor health literacy, difficulty with reading and writing per family report Employment: Retired   PATIENT GOALS    per pt's sister-in-law, she hopes he will talk again  SUBJECTIVE STATEMENT: Pt pleasant, reports I'm alright Pt accompanied by: sister-in-law  OBJECTIVE:   TODAY'S TREATMENT:  Skilled ST treatment session targeted pt's aphasia and apraxia of speech goals. SLP facilitated session by providing the following interventions:  To increase comprehension skills, pictures of new everyday items paired with their words  were presented in the following:  SLP verbally presented each picture and written word. Pt then said each word with the clinician. The last round consisted of pt saying word by self.  Everyday items: when verbalized with SLP: Achieved 4 out of 10 trials independently, improved to 5 out of 10 given max A. Pt with decreased intelligibility during today's session when given max A. Pt's speech consisted of phonemic paraphasias and neologisms. Sister-in-law reports pt has been more difficult to understand today.    Given index card with pt's name, pt independently produced his first and last name intelligibly in 2 out of 4 opportunities, with no improvement given max A. Pt unable to intelligibly produce dog and sister in law's name given max A.     Receptive Language further targeted thru: Stage 2 - Given 3 pictures of everyday items, pt asked to point to 1 object by function. Pt independently pointed to 8 out of 8 items.  Stage 3 - Given 3 pictures of everyday items, pt asked to point, in sequence, to 2 objects by function. Pt independently pointed to 7 out of 8 items. Improved to 8 out of 8 given mod A.  Stage 4: Given 3 pictures of  everyday items, pt asked to point, in sequence, to 2 objects by name. Pt independently pointed to 4 out of 4 items. To further facilitate activity, pt given a field of 4 pictures. Pt independently pointed to 8 out of 9 items. Improved to 9 out of 9 given min A.   [employer] Therapy app Answering questions Level 2: Once oriented to the task, pt independently achieved 11 out of 18 trials. Improved to 17 out of 18 given min to max A (verbal and semantic cues).  Following directions level 1: Pt independently achieved 8 out of 20 trials. Improved to 17 out of 20 given min to max A (verbal and semantic cues).  PATIENT EDUCATION: Education details: see above Person educated: Patient and his sister-in-law Education method: Explanation Education comprehension: needs further  education  HOME EXERCISE PROGRAM:   Supplement verbal information with single written words  GOALS:  Goals reviewed with patient? Yes  SHORT TERM GOALS: Target date: 10 sessions  Updated 12/25/2023  Updated: 11/15/2023 Updated: 10/10/2023 With maximal assistance, pt will select written word to express request for food/drink item in 7 out of 10 opportunities.  Baseline: Goal status: INITIAL: MET  2.  With maximal multimodal assistance, pt will answer yes/no questions in 8 out of 10 opportunities.  Baseline:  Goal status: INITIAL: Is able to answer using written language when choosing between written yes/no: MET  3.  With maximal multimodal assistance, pt will verbally approximate common object names in 5 out of 10 opportunities.  Baseline:  Goal status: INITIAL: progress made: MET  4. With minimal assistance, pt will point to 1 common object by function in a field of 3 with 90% accuracy.  Baseline:  Goal status: INITIAL: progress made: With minimal assistance, pt will point to 1 common object by function in a field of 3 with 80% accuracy in 3 out of 5 sessions.     5. With minimal to moderate assistance, pt will point, in sequence to 2 common objects by function in field of 3 with 90% accuracy.   Baseline:  Goal status: INITIAL: progress made: With minimal to moderate assistance, pt will point, in sequence to 2 common objects by function in field of 3 with 80% accuracy in 3 out of 5 sessions.     6. With minimal to moderate assistance pt will verablly approximate names of basic functional objects with > 75% speech intelligiblity at the word level.    Baseline:  Goal status: INITIAL: progress made, continue targeting: With minimal to moderate assistance pt with verablly approximate names of basic functional objects with > 75% speech intelligiblity at the word level in 3 out of 5 sessions.    7. With minimal assistance, pt will identify body parts with 90% accuracy.    Baseline:  Goal status: INITIAL: MET  8. With moderate assistance, pt will follow 1 step directions with 75% accuracy. Baseline: Goal status: INITIAL: MET  9. With minimal assistance, pt will point, in sequence, to 2 common objects by name from a field of 3 with 80% accuracy across 3 consecutive sessions.  Baseline:  Goal status: INITIAL:   LONG TERM GOALS: Target date: 11/20/2023  Updated 12/25/2023  Updated: 11/15/2023 Updated: 10/10/2023 Pt will use multimodal communication and maximal assistance to communicate basic wants and needs in 2 out of 5 opportunities per pt and family report.  Baseline:  Goal status: INITIAL: progress, continue targeting  2.  With Maximal A, patient/family will demonstrate understanding of the following concepts: aphasia,  spontaneous recovery, communication vs conversation, strengths/strategies to promote success, local resources by answering multiple choice questions with 50% accuracy when provided supported conversation in order to increase patient and caregiver's participation in medical care.  Baseline:  Goal status: INITIAL: progress, continue targeting   ASSESSMENT:  CLINICAL IMPRESSION: Patient is a 73 y.o. right handed male who was seen today for a speech language treatment d/t left MCA CVA.   Pt presents with improving language impairment, now conceptualized as moderate to severe Wernicke's Aphasia (as per WAB-R, above).   Verbal Communication Pt's verbal communication continues to be fluent but increased number of islands of intelligible speech especially when commenting.   Auditory Comprehension Pt with improved auditory comprehension as evidenced by his ability to select objects in field of 4, answer basic to progressing semi-complex yes/no questions and following 1 step directions.   Written Language Pt with improved letter recognition and ability to copy written words.   Reading Comprehension Continues to be a strength for pt.  Utilized to supplement receptive language deficits.   See the above treatment note for details.   OBJECTIVE IMPAIRMENTS include expressive language, receptive language, aphasia, apraxia, and written language. These impairments are limiting patient from managing medications, managing appointments, managing finances, household responsibilities, ADLs/IADLs, and effectively communicating at home and in community. Factors affecting potential to achieve goals and functional outcome are severity of impairments, family/community support, and significantly reduced health literacy. Patient will benefit from skilled SLP services to address above impairments and improve overall function.  REHAB POTENTIAL: Good  PLAN: SLP FREQUENCY: 1-2x/week  SLP DURATION: 12 weeks  PLANNED INTERVENTIONS: Language facilitation, Cueing hierachy, Internal/external aids, Functional tasks, Multimodal communication approach, SLP instruction and feedback, Compensatory strategies, and Patient/family education  Hassell Roys, SLP Student Clinician  Happi B. Rubbie, M.S., CCC-SLP, Tree Surgeon Certified Brain Injury Specialist [facility]  [facility] Office [phone] Ascom [phone] Fax [phone]

## 2023-12-27 ENCOUNTER — Encounter: Admitting: Occupational Therapy

## 2023-12-27 ENCOUNTER — Ambulatory Visit: Admitting: Speech Pathology

## 2023-12-27 DIAGNOSIS — R4701 Aphasia: Secondary | ICD-10-CM

## 2023-12-27 DIAGNOSIS — R482 Apraxia: Secondary | ICD-10-CM

## 2023-12-27 NOTE — Therapy (Unsigned)
 OUTPATIENT SPEECH LANGUAGE PATHOLOGY  SPEECH LANGUAGE TREATMENT NOTE  PROGRESS NOTE  Patient Name: STEVIN BIELINSKI MRN: [id number] DOB:1951-01-07, 73 y.o., male Today's Date: 12/27/2023  Speech Therapy Progress Note  Dates of Reporting Period: 11/16/2023 to 12/25/2023  Objective: Patient has been seen for 10 speech therapy sessions this reporting period targeting Wernicke's Aphasia. Patient is making progress toward LTGs and met 2 STGs this reporting period. See skilled intervention, clinical impressions, and goals below for details.   PCP: Denyse DELENA Bathe, MD REFERRING PROVIDER: Arland Earnie Pouch, MD    End of Session - 12/27/23 1434     Visit Number 31    Number of Visits 44    Date for Recertification  02/07/24    Authorization Type [employer]    Progress Note Due on Visit 30    SLP Start Time 1145    SLP Stop Time  1230    SLP Time Calculation (min) 45 min    Activity Tolerance Patient tolerated treatment well          Past Medical History:  Diagnosis Date   Anxiety    Aortic atherosclerosis 02/16/2017   Arthritis    Blind left eye    COPD (chronic obstructive pulmonary disease) (HCC)    Depression    Dysrhythmia    A-fib   GERD (gastroesophageal reflux disease)    Hypertension    Low TSH level 02/17/2017   Myocardial infarction [facility])    PAD (peripheral artery disease)    Paroxysmal atrial fibrillation (HCC)    Perforated duodenal ulcer (HCC)    Stroke (HCC)    left side is weak   Past Surgical History:  Procedure Laterality Date   BRONCHIAL BIOPSY  03/22/2022   Procedure: BRONCHIAL BIOPSIES;  Surgeon: Isadora Hose, MD;  Location: [facility];  Service: Pulmonary;;   BRONCHIAL NEEDLE ASPIRATION BIOPSY  03/22/2022   Procedure: BRONCHIAL NEEDLE ASPIRATION BIOPSIES;  Surgeon: Isadora Hose, MD;  Location: [facility];  Service: Pulmonary;;   ENDOBRONCHIAL ULTRASOUND  03/22/2022   Procedure: ENDOBRONCHIAL ULTRASOUND;  Surgeon: Isadora Hose,  MD;  Location: [facility];  Service: Pulmonary;;   EXPLORATORY LAPAROTOMY  05/12/2015   EYE SURGERY     FRACTURE SURGERY     LAPAROTOMY N/A 05/10/2015   Procedure: EXPLORATORY LAPAROTOMY;  Surgeon: Lynda Leos, MD;  Location: [facility] OR;  Service: General;  Laterality: N/A;   LEFT HEART CATH AND CORONARY ANGIOGRAPHY N/A 05/02/2016   Procedure: Left Heart Cath and Coronary Angiography;  Surgeon: Gordy Bergamo, MD;  Location: [facility] INVASIVE CV LAB;  Service: Cardiovascular;  Laterality: N/A;   LOWER EXTREMITY ANGIOGRAPHY Left 09/21/2021   Procedure: Lower Extremity Angiography;  Surgeon: Jama Cordella KANDICE, MD;  Location: [facility] INVASIVE CV LAB;  Service: Cardiovascular;  Laterality: Left;   Patient Active Problem List   Diagnosis Date Noted   Allergic rhinitis due to pollen 12/11/2022   Tinea cruris 12/11/2022   BPH associated with nocturia 12/11/2022   Chest pain, non-cardiac 04/11/2022   Adenocarcinoma of lower lobe of right lung (HCC) 03/28/2022   Pulmonary nodule 1 cm or greater in diameter 03/22/2022   Leg weakness, bilateral 02/09/2022   Osteopenia 10/13/2021   PAD (peripheral artery disease) 09/21/2021   GERD without esophagitis 09/21/2021   Dyslipidemia 09/21/2021   Depression 09/21/2021   Atrial flutter (HCC) 09/21/2021   Chronic obstructive pulmonary disease (COPD) (HCC) 09/21/2021   Coronary artery disease 09/21/2021   Cerebrovascular accident (CVA) due to embolism of cerebral artery (HCC)  09/06/2021   Bilateral edema of lower extremity 08/05/2020   Vasovagal syncope 08/02/2020   Annual physical exam 12/26/2019   Hyperlipidemia 12/26/2019   Abrasion of right hand 12/26/2019   Metabolic syndrome 11/11/2019   Low TSH level 02/17/2017   Atrial flutter with rapid ventricular response (HCC) 02/16/2017   Cigarette smoker 02/16/2017   COPD with acute exacerbation (HCC) 02/16/2017   Elevated lactic acid level 02/16/2017   Aortic atherosclerosis 02/16/2017   Productive cough    Hypoxia  05/24/2015   SOB (shortness of breath) 05/24/2015   COPD suggested by initial evaluation 05/17/2015   Essential hypertension 05/17/2015   Smoking 05/17/2015    ONSET DATE: 08/09/2023   REFERRING DIAG: I63.9 (ICD-10-CM) - Cerebral infarction, unspecified   THERAPY DIAG:  Aphasia  Apraxia  Rationale for Evaluation and Treatment Rehabilitation  SUBJECTIVE:   PERTINENT HISTORY and DIAGNOSTIC FINDINGS: Pt is a 73 year old right handed male who presented to [facility] ED on 08/09/2023 with stroke-like symptoms - slurred speech and difficulty answering questions and weakness to his right side extremities. CT head and subsequent CTA head and neck shows left M2 occlusion with large penumbra in the region. Pt transferred to [facility] for mechanical thrombectomy on 08/10/2023. Pt admitted at [facility] from 08/10/2023 thru 08/21/2023 with recommended for home health.   MRI brain: Remonstrated left MCA territory infarct primarily involving the parietal lobe.   Additional medical history includes: blindness left eye, A-fib on warfarin, COPD, HFpEF, HTN, current smoker, ethanol abuse, RLL adenocarcinoma s/p SBRT (2024)    PAIN:  Are you having pain? Unable to communicate  FALLS: Has patient fallen in last 6 months?  No  LIVING ENVIRONMENT: Lives with: sister-in-law Lives in: House/apartment  PLOF:  Level of assistance: Independent with ADLs, Independent with IADLs, Comment: poor health literacy, difficulty with reading and writing per family report Employment: Retired   PATIENT GOALS    per pt's sister-in-law, she hopes he will talk again  SUBJECTIVE STATEMENT: Pt pleasant Pt accompanied by: sister-in-law  OBJECTIVE:   TODAY'S TREATMENT:  Skilled ST treatment session targeted pt's aphasia and apraxia of speech goals. SLP facilitated session by providing the following interventions:  Pt with increased intelligibility during today's session - sister-in-law reports he's talking better today  To  increase comprehension skills, pictures of everyday items paired with their words were presented in the following:  SLP verbally presented each picture and written word. Pt then said each word with the clinician. The last round consisted of pt saying word by self.  When verbalized with SLP: Achieved 8 out of 10 trials independently, improved to 5 out of 10 given mod A.  When verbalized by self: Achieved 4 out of 10 trials independently, improved to 9 out of 10 given max A. Pt benefited from phonemic, visual, and verbal cues.  Given index card with pt's name, pt unable to independently produced his first name intelligibly, with no improvement given max A. Pt able to intermittently produce last name. Pt intelligibly produced dog and sister in law's name independently.   Receptive Language further targeted thru: Stage 2 - Given 3 pictures of everyday items, pt asked to point to 1 object by function. Pt independently pointed to 8 out of 10 items. Improved to 10 out of 10 given moderate visual and verbal cues. Stage 3 - Given 3 pictures of everyday items, pt asked to point, in sequence, to 2 objects by function. Pt independently pointed to 4 out of 10 items. Improved to 9  out of 10 given moderate to maximal visual and verbal cues.  Stage 4: Given 3 pictures of everyday items, pt asked to point, in sequence, to 2 objects by name. Pt independently pointed to 10 out of 10 items.   TalkPath Therapy app Following directions level 1: Pt independently achieved 6 out of 20 trials. Improved to 19 out of 20 given min to max verbal, visual, and semantic cues.  PATIENT EDUCATION: Education details: see above Person educated: Patient and his sister-in-law Education method: Explanation Education comprehension: needs further education  HOME EXERCISE PROGRAM:   Supplement verbal information with single written words  GOALS:  Goals reviewed with patient? Yes  SHORT TERM GOALS: Target date: 10  sessions  Updated 12/25/2023  Updated: 11/15/2023 Updated: 10/10/2023 With maximal assistance, pt will select written word to express request for food/drink item in 7 out of 10 opportunities.  Baseline: Goal status: INITIAL: MET  2.  With maximal multimodal assistance, pt will answer yes/no questions in 8 out of 10 opportunities.  Baseline:  Goal status: INITIAL: Is able to answer using written language when choosing between written yes/no: MET  3.  With maximal multimodal assistance, pt will verbally approximate common object names in 5 out of 10 opportunities.  Baseline:  Goal status: INITIAL: progress made: MET  4. With minimal assistance, pt will point to 1 common object by function in a field of 3 with 90% accuracy.  Baseline:  Goal status: INITIAL: progress made: With minimal assistance, pt will point to 1 common object by function in a field of 3 with 80% accuracy in 3 out of 5 sessions.     5. With minimal to moderate assistance, pt will point, in sequence to 2 common objects by function in field of 3 with 90% accuracy.   Baseline:  Goal status: INITIAL: progress made: With minimal to moderate assistance, pt will point, in sequence to 2 common objects by function in field of 3 with 80% accuracy in 3 out of 5 sessions.     6. With minimal to moderate assistance pt will verablly approximate names of basic functional objects with > 75% speech intelligiblity at the word level.    Baseline:  Goal status: INITIAL: progress made, continue targeting: With minimal to moderate assistance pt with verablly approximate names of basic functional objects with > 75% speech intelligiblity at the word level in 3 out of 5 sessions.    7. With minimal assistance, pt will identify body parts with 90% accuracy.   Baseline:  Goal status: INITIAL: MET  8. With moderate assistance, pt will follow 1 step directions with 75% accuracy. Baseline: Goal status: INITIAL: MET  9. With minimal  assistance, pt will point, in sequence, to 2 common objects by name from a field of 3 with 80% accuracy across 3 consecutive sessions.  Baseline:  Goal status: INITIAL:   LONG TERM GOALS: Target date: 11/20/2023  Updated 12/25/2023  Updated: 11/15/2023 Updated: 10/10/2023 Pt will use multimodal communication and maximal assistance to communicate basic wants and needs in 2 out of 5 opportunities per pt and family report.  Baseline:  Goal status: INITIAL: progress, continue targeting  2.  With Maximal A, patient/family will demonstrate understanding of the following concepts: aphasia, spontaneous recovery, communication vs conversation, strengths/strategies to promote success, local resources by answering multiple choice questions with 50% accuracy when provided supported conversation in order to increase patient and caregiver's participation in medical care.  Baseline:  Goal status: INITIAL: progress, continue targeting  ASSESSMENT:  CLINICAL IMPRESSION: Patient is a 73 y.o. right handed male who was seen today for a speech language treatment d/t left MCA CVA.   Pt presents with improving language impairment, now conceptualized as moderate to severe Wernicke's Aphasia (as per WAB-R, above).   Verbal Communication Pt's verbal communication continues to be fluent but increased number of islands of intelligible speech especially when commenting.   Auditory Comprehension Pt with improved auditory comprehension as evidenced by his ability to select objects in field of 4, answer basic to progressing semi-complex yes/no questions and following 1 step directions.   Written Language Pt with improved letter recognition and ability to copy written words.   Reading Comprehension Continues to be a strength for pt. Utilized to supplement receptive language deficits.   See the above treatment note for details.   OBJECTIVE IMPAIRMENTS include expressive language, receptive language, aphasia,  apraxia, and written language. These impairments are limiting patient from managing medications, managing appointments, managing finances, household responsibilities, ADLs/IADLs, and effectively communicating at home and in community. Factors affecting potential to achieve goals and functional outcome are severity of impairments, family/community support, and significantly reduced health literacy. Patient will benefit from skilled SLP services to address above impairments and improve overall function.  REHAB POTENTIAL: Good  PLAN: SLP FREQUENCY: 1-2x/week  SLP DURATION: 12 weeks  PLANNED INTERVENTIONS: Language facilitation, Cueing hierachy, Internal/external aids, Functional tasks, Multimodal communication approach, SLP instruction and feedback, Compensatory strategies, and Patient/family education  Hassell Roys, SLP Student Clinician  Happi B. Rubbie, M.S., CCC-SLP, Tree Surgeon Certified Brain Injury Specialist [facility]  [facility] Office [phone] Ascom [phone] Fax [phone]

## 2023-12-31 ENCOUNTER — Other Ambulatory Visit: Payer: Self-pay | Admitting: Internal Medicine

## 2023-12-31 ENCOUNTER — Ambulatory Visit: Admitting: Internal Medicine

## 2023-12-31 VITALS — BP 152/84 | HR 48 | Ht 73.0 in | Wt 191.2 lb

## 2023-12-31 DIAGNOSIS — I6340 Cerebral infarction due to embolism of unspecified cerebral artery: Secondary | ICD-10-CM

## 2023-12-31 DIAGNOSIS — R4701 Aphasia: Secondary | ICD-10-CM | POA: Diagnosis not present

## 2023-12-31 DIAGNOSIS — I2510 Atherosclerotic heart disease of native coronary artery without angina pectoris: Secondary | ICD-10-CM

## 2023-12-31 DIAGNOSIS — M19012 Primary osteoarthritis, left shoulder: Secondary | ICD-10-CM

## 2023-12-31 DIAGNOSIS — I10 Essential (primary) hypertension: Secondary | ICD-10-CM

## 2023-12-31 DIAGNOSIS — Z23 Encounter for immunization: Secondary | ICD-10-CM | POA: Diagnosis not present

## 2023-12-31 DIAGNOSIS — Z0001 Encounter for general adult medical examination with abnormal findings: Secondary | ICD-10-CM | POA: Diagnosis not present

## 2023-12-31 MED ORDER — CELECOXIB 200 MG PO CAPS: 200.0000 mg | ORAL_CAPSULE | Freq: Every day | ORAL | 2 refills | Status: DC

## 2023-12-31 MED ORDER — ATORVASTATIN CALCIUM 40 MG PO TABS: 40.0000 mg | ORAL_TABLET | Freq: Every day | ORAL | 3 refills | Status: DC

## 2023-12-31 MED ORDER — DICLOFENAC SODIUM 1 % EX GEL: 2.0000 g | Freq: Two times a day (BID) | CUTANEOUS | 0 refills | Status: DC

## 2023-12-31 NOTE — Progress Notes (Signed)
 Established Patient Office Visit  Subjective:  Patient ID: Gregory Lane, male    DOB: 02-02-1951  Age: 73 y.o. MRN: [id number]  Chief Complaint  Patient presents with   Annual Exam    AWV, lab results    No new complaints, here for AWV refer to quality metrics and scanned documents.  Still c/o left shoulder pain but celebrex  was not resumed after recent hospitalization. Speech is improving with improving strength but still c/o impaired balance and a fall last week.     No other concerns at this time.   Past Medical History:  Diagnosis Date   Anxiety    Aortic atherosclerosis 02/16/2017   Arthritis    Blind left eye    COPD (chronic obstructive pulmonary disease) (HCC)    Depression    Dysrhythmia    A-fib   GERD (gastroesophageal reflux disease)    Hypertension    Low TSH level 02/17/2017   Myocardial infarction [facility])    PAD (peripheral artery disease)    Paroxysmal atrial fibrillation (HCC)    Perforated duodenal ulcer (HCC)    Stroke (HCC)    left side is weak    Past Surgical History:  Procedure Laterality Date   BRONCHIAL BIOPSY  03/22/2022   Procedure: BRONCHIAL BIOPSIES;  Surgeon: Isadora Hose, MD;  Location: [facility];  Service: Pulmonary;;   BRONCHIAL NEEDLE ASPIRATION BIOPSY  03/22/2022   Procedure: BRONCHIAL NEEDLE ASPIRATION BIOPSIES;  Surgeon: Isadora Hose, MD;  Location: [facility];  Service: Pulmonary;;   ENDOBRONCHIAL ULTRASOUND  03/22/2022   Procedure: ENDOBRONCHIAL ULTRASOUND;  Surgeon: Isadora Hose, MD;  Location: [facility];  Service: Pulmonary;;   EXPLORATORY LAPAROTOMY  05/12/2015   EYE SURGERY     FRACTURE SURGERY     LAPAROTOMY N/A 05/10/2015   Procedure: EXPLORATORY LAPAROTOMY;  Surgeon: Lynda Leos, MD;  Location: [facility] OR;  Service: General;  Laterality: N/A;   LEFT HEART CATH AND CORONARY ANGIOGRAPHY N/A 05/02/2016   Procedure: Left Heart Cath and Coronary Angiography;  Surgeon: Gordy Bergamo, MD;  Location: [facility] INVASIVE CV  LAB;  Service: Cardiovascular;  Laterality: N/A;   LOWER EXTREMITY ANGIOGRAPHY Left 09/21/2021   Procedure: Lower Extremity Angiography;  Surgeon: Jama Cordella KANDICE, MD;  Location: [facility] INVASIVE CV LAB;  Service: Cardiovascular;  Laterality: Left;    Social History   Socioeconomic History   Marital status: Single    Spouse name: Not on file   Number of children: Not on file   Years of education: Not on file   Highest education level: Not on file  Occupational History   Not on file  Tobacco Use   Smoking status: Every Day    Current packs/day: 0.50    Average packs/day: 0.5 packs/day for 65.0 years (32.5 ttl pk-yrs)    Types: Cigarettes   Smokeless tobacco: Never   Tobacco comments:    0.5 PPD  Vaping Use   Vaping status: Never Used  Substance and Sexual Activity   Alcohol use: Yes    Alcohol/week: 60.0 standard drinks of alcohol    Types: 60 Standard drinks or equivalent per week    Comment: daily 12 pk of beer   Drug use: No   Sexual activity: Not Currently  Other Topics Concern   Not on file  Social History Narrative   [employer] [employer]. Lives with sister and sister-in-law   Social Drivers of Health   Financial Resource Strain: Low Risk  (12/11/2023)   Received from [facility]  Overall Financial Resource Strain (CARDIA)    Difficulty of Paying Living Expenses: Not very hard  Food Insecurity: Food Insecurity Present (12/11/2023)   Received from [facility]   Hunger Vital Sign    Within the past 12 months, you worried that your food would run out before you got the money to buy more.: Often true    Within the past 12 months, the food you bought just didn't last and you didn't have money to get more.: Sometimes true  Transportation Needs: No Transportation Needs (12/11/2023)   Received from [facility] - Transportation    In the past 12 months, has lack of transportation kept you from medical appointments  or from getting medications?: No    Lack of Transportation (Non-Medical): No  Physical Activity: Inactive (01/23/2022)   Exercise Vital Sign    Days of Exercise per Week: 0 days    Minutes of Exercise per Session: 0 min  Stress: No Stress Concern Present (09/09/2021)   [employer] of [employer]ational [employer] - Occupational Stress Questionnaire    Feeling of Stress : Only a little  Social Connections: Socially Isolated (09/09/2021)   Social Connection and Isolation Panel    Frequency of Communication with Friends and Family: More than three times a week    Frequency of Social Gatherings with Friends and Family: More than three times a week    Attends Religious Services: Never    Database Administrator or Organizations: No    Attends Banker Meetings: Never    Marital Status: Never married  Intimate Partner Violence: Not At Risk (03/28/2022)   Humiliation, Afraid, Rape, and Kick questionnaire    Fear of Current or Ex-Partner: No    Emotionally Abused: No    Physically Abused: No    Sexually Abused: No    Family History  Problem Relation Age of Onset   Hypertension Mother    Dementia Mother    Cirrhosis Father    Cancer Father    Lung cancer Sister    Hypertension Sister    Arthritis Sister    Heart murmur Sister    Arrhythmia Sister     No Known Allergies  Outpatient Medications Prior to Visit  Medication Sig   apixaban  (ELIQUIS ) 5 MG TABS tablet Take 1 tablet (5 mg total) by mouth 2 (two) times daily.   cetirizine  (ZYRTEC ) 10 MG tablet TAKE 1 TABLET BY MOUTH EVERY DAY   FLUoxetine  (PROZAC ) 20 MG capsule Take 2 capsules (40 mg total) by mouth daily.   losartan  (COZAAR ) 50 MG tablet Take 1 tablet (50 mg total) by mouth 2 (two) times daily.   pantoprazole  (PROTONIX ) 40 MG tablet Take 1 tablet (40 mg total) by mouth 2 (two) times daily.   [DISCONTINUED] atorvastatin  (LIPITOR) 40 MG tablet TAKE 1 TABLET BY MOUTH EVERY DAY   fluticasone  (FLONASE ) 50 MCG/ACT nasal  spray Place 1 spray into both nostrils daily. (Patient not taking: Reported on 12/31/2023)   furosemide  (LASIX ) 20 MG tablet Take 20 mg by mouth daily. (Patient not taking: Reported on 12/31/2023)   KLOR-CON  M20 20 MEQ tablet Take 20 mEq by mouth 2 (two) times daily. (Patient not taking: Reported on 12/31/2023)   No facility-administered medications prior to visit.    Review of Systems  Constitutional:  Positive for weight loss (3 lbs).  HENT: Negative.    Eyes: Negative.   Respiratory: Negative.    Cardiovascular: Negative.   Gastrointestinal:  Negative.   Genitourinary: Negative.   Skin:  Negative for itching and rash.  Neurological: Negative.   Endo/Heme/Allergies: Negative.   Psychiatric/Behavioral:  The patient has insomnia.        Objective:   BP (!) 152/84   Pulse (!) 48   Ht 6' 1 (1.854 m)   Wt 191 lb 3.2 oz (86.7 kg)   SpO2 98%   BMI 25.23 kg/m   Vitals:   12/31/23 1312  BP: (!) 152/84  Pulse: (!) 48  Height: 6' 1 (1.854 m)  Weight: 191 lb 3.2 oz (86.7 kg)  SpO2: 98%  BMI (Calculated): 25.23    Physical Exam Vitals reviewed.  Constitutional:      Appearance: Normal appearance.  HENT:     Head: Normocephalic.     Left Ear: There is no impacted cerumen.     Nose: Nose normal.     Mouth/Throat:     Mouth: Mucous membranes are moist.     Pharynx: No posterior oropharyngeal erythema.  Eyes:     Extraocular Movements: Extraocular movements intact.     Pupils: Pupils are equal, round, and reactive to light.  Cardiovascular:     Rate and Rhythm: Regular rhythm.     Chest Wall: PMI is not displaced.     Pulses: Normal pulses.     Heart sounds: Normal heart sounds. No murmur heard. Pulmonary:     Effort: Pulmonary effort is normal.     Breath sounds: Normal air entry. No rhonchi or rales.  Abdominal:     General: Abdomen is flat. Bowel sounds are normal. There is no distension.     Palpations: Abdomen is soft. There is no hepatomegaly, splenomegaly  or mass.     Tenderness: There is no abdominal tenderness.  Musculoskeletal:     Left shoulder: Decreased range of motion. Decreased strength.     Cervical back: Normal range of motion and neck supple.     Right lower leg: No edema.     Left lower leg: No edema.  Skin:    General: Skin is warm and dry.  Neurological:     General: No focal deficit present.     Mental Status: He is alert and oriented to person, place, and time.     Cranial Nerves: No cranial nerve deficit.     Motor: No weakness.     Comments: Expressive aphasia  Psychiatric:        Mood and Affect: Mood normal.        Behavior: Behavior normal.      No results found for any visits on 12/31/23.  No results found for this or any previous visit (from the past 2160 hours).    Assessment & Plan:   Problem List Items Addressed This Visit       Cardiovascular and Mediastinum   Essential hypertension   Relevant Medications   atorvastatin  (LIPITOR) 40 MG tablet   Cerebrovascular accident (CVA) due to embolism of cerebral artery (HCC)   Relevant Medications   atorvastatin  (LIPITOR) 40 MG tablet   Coronary artery disease   Relevant Medications   atorvastatin  (LIPITOR) 40 MG tablet     Musculoskeletal and Integument   Arthritis of left shoulder   Relevant Medications   celecoxib  (CELEBREX ) 200 MG capsule   diclofenac Sodium (VOLTAREN) 1 % GEL     Other   Aphasia - Primary    Return in 2 weeks (on 01/14/2024) for lab results and shoulder f/u.  Total time spent: 20 minutes. This time includes review of previous notes and results and patient face to face interaction during today'Giannina Bartolome visit.    Sherrill Cinderella Perry, MD  12/31/2023   This document may have been prepared by [facility] Voice Recognition software and as such may include unintentional dictation errors.

## 2024-01-01 ENCOUNTER — Encounter: Admitting: Occupational Therapy

## 2024-01-01 ENCOUNTER — Ambulatory Visit: Admitting: Speech Pathology

## 2024-01-01 DIAGNOSIS — R4701 Aphasia: Secondary | ICD-10-CM | POA: Diagnosis not present

## 2024-01-01 DIAGNOSIS — R482 Apraxia: Secondary | ICD-10-CM

## 2024-01-01 LAB — CBC WITH DIFF/PLATELET
Basophils Absolute: 0.1 10*3/uL (ref 0.0–0.2)
Basos: 2 %
EOS (ABSOLUTE): 0.2 10*3/uL (ref 0.0–0.4)
Eos: 4 %
Hematocrit: 41.9 % (ref 37.5–51.0)
Hemoglobin: 13.7 g/dL (ref 13.0–17.7)
Immature Grans (Abs): 0 10*3/uL (ref 0.0–0.1)
Immature Granulocytes: 0 %
Lymphocytes Absolute: 0.9 10*3/uL (ref 0.7–3.1)
Lymphs: 21 %
MCH: 31.7 pg (ref 26.6–33.0)
MCHC: 32.7 g/dL (ref 31.5–35.7)
MCV: 97 fL (ref 79–97)
Monocytes Absolute: 0.4 10*3/uL (ref 0.1–0.9)
Monocytes: 10 %
Neutrophils Absolute: 2.7 10*3/uL (ref 1.4–7.0)
Neutrophils: 63 %
Platelets: 198 10*3/uL (ref 150–450)
RBC: 4.32 x10E6/uL (ref 4.14–5.80)
RDW: 13.8 % (ref 11.6–15.4)
WBC: 4.2 10*3/uL (ref 3.4–10.8)

## 2024-01-01 LAB — COMPREHENSIVE METABOLIC PANEL WITH GFR
ALT: 10 [IU]/L (ref 0–44)
AST: 12 [IU]/L (ref 0–40)
Albumin: 4.2 g/dL (ref 3.8–4.8)
Alkaline Phosphatase: 113 [IU]/L (ref 47–123)
BUN/Creatinine Ratio: 16 (ref 10–24)
BUN: 15 mg/dL (ref 8–27)
Bilirubin Total: 0.6 mg/dL (ref 0.0–1.2)
CO2: 24 mmol/L (ref 20–29)
Calcium: 8.8 mg/dL (ref 8.6–10.2)
Chloride: 101 mmol/L (ref 96–106)
Creatinine, Ser: 0.93 mg/dL (ref 0.76–1.27)
Globulin, Total: 2.4 g/dL (ref 1.5–4.5)
Glucose: 78 mg/dL (ref 70–99)
Potassium: 4.3 mmol/L (ref 3.5–5.2)
Sodium: 137 mmol/L (ref 134–144)
Total Protein: 6.6 g/dL (ref 6.0–8.5)
eGFR: 86 mL/min/{1.73_m2}

## 2024-01-01 LAB — LIPID PANEL
Chol/HDL Ratio: 4 ratio (ref 0.0–5.0)
Cholesterol, Total: 178 mg/dL (ref 100–199)
HDL: 44 mg/dL (ref >39)
LDL Chol Calc (NIH): 122 mg/dL — ABNORMAL HIGH (ref 0–99)
Triglycerides: 61 mg/dL (ref 0–149)
VLDL Cholesterol Cal: 12 mg/dL (ref 5–40)

## 2024-01-01 NOTE — Therapy (Signed)
 OUTPATIENT SPEECH LANGUAGE PATHOLOGY  SPEECH LANGUAGE TREATMENT NOTE    Patient Name: Gregory Lane MRN: [id number] DOB:[birth year], 73 y.o., male 74 Date: 01/01/2024   PCP: Denyse DELENA Bathe, MD REFERRING PROVIDER: Arland Earnie Pouch, MD    End of Session - 01/01/24 1144     Visit Number 32    Number of Visits 44    Date for Recertification  02/07/24    Authorization Type [employer] [place]    Progress Note Due on Visit 40    SLP Start Time 1145    SLP Stop Time  1230    SLP Time Calculation (min) 45 min    Activity Tolerance Patient tolerated treatment well          Past Medical History:  Diagnosis Date   Anxiety    Aortic atherosclerosis 02/16/2017   Arthritis    Blind left eye    COPD (chronic obstructive pulmonary disease) (HCC)    Depression    Dysrhythmia    A-fib   GERD (gastroesophageal reflux disease)    Hypertension    Low TSH level 02/17/2017   Myocardial infarction [facility])    PAD (peripheral artery disease)    Paroxysmal atrial fibrillation (HCC)    Perforated duodenal ulcer (HCC)    Stroke (HCC)    left side is weak   Past Surgical History:  Procedure Laterality Date   BRONCHIAL BIOPSY  03/22/2022   Procedure: BRONCHIAL BIOPSIES;  Surgeon: Isadora Hose, MD;  [facility];  Service: Pulmonary;;   BRONCHIAL NEEDLE ASPIRATION BIOPSY  03/22/2022   Procedure: BRONCHIAL NEEDLE ASPIRATION BIOPSIES;  Surgeon: Isadora Hose, MD;  [facility];  Service: Pulmonary;;   ENDOBRONCHIAL ULTRASOUND  03/22/2022   Procedure: ENDOBRONCHIAL ULTRASOUND;  Surgeon: Isadora Hose, MD;  [facility];  Service: Pulmonary;;   EXPLORATORY LAPAROTOMY  05/12/2015   EYE SURGERY     FRACTURE SURGERY     LAPAROTOMY N/A 05/10/2015   Procedure: EXPLORATORY LAPAROTOMY;  Surgeon: Lynda Leos, MD;  Location: [facility] OR;  Service: General;  Laterality: N/A;   LEFT HEART CATH AND CORONARY ANGIOGRAPHY N/A 05/02/2016   Procedure: Left Heart Cath  and Coronary Angiography;  Surgeon: Gordy Bergamo, MD;  Location: [facility] INVASIVE CV LAB;  Service: Cardiovascular;  Laterality: N/A;   LOWER EXTREMITY ANGIOGRAPHY Left 09/21/2021   Procedure: Lower Extremity Angiography;  Surgeon: Jama Cordella KANDICE, MD;  Location: [facility] INVASIVE CV LAB;  Service: Cardiovascular;  Laterality: Left;   Patient Active Problem List   Diagnosis Date Noted   Aphasia 12/31/2023   Arthritis of left shoulder 12/31/2023   Allergic rhinitis due to pollen 12/11/2022   Tinea cruris 12/11/2022   BPH associated with nocturia 12/11/2022   Chest pain, non-cardiac 04/11/2022   Adenocarcinoma of lower lobe of right lung (HCC) 03/28/2022   Pulmonary nodule 1 cm or greater in diameter 03/22/2022   Leg weakness, bilateral 02/09/2022   Osteopenia 10/13/2021   PAD (peripheral artery disease) 09/21/2021   GERD without esophagitis 09/21/2021   Dyslipidemia 09/21/2021   Depression 09/21/2021   Atrial flutter (HCC) 09/21/2021   Chronic obstructive pulmonary disease (COPD) (HCC) 09/21/2021   Coronary artery disease 09/21/2021   Cerebrovascular accident (CVA) due to embolism of cerebral artery (HCC) 09/06/2021   Bilateral edema of lower extremity 08/05/2020   Vasovagal syncope 08/02/2020   Annual physical exam 12/26/2019   Hyperlipidemia 12/26/2019   Abrasion of right hand 12/26/2019   Metabolic syndrome 11/11/2019   Low TSH level 02/17/2017  Atrial flutter with rapid ventricular response (HCC) 02/16/2017   Cigarette smoker 02/16/2017   COPD with acute exacerbation (HCC) 02/16/2017   Elevated lactic acid level 02/16/2017   Aortic atherosclerosis 02/16/2017   Productive cough    Hypoxia 05/24/2015   SOB (shortness of breath) 05/24/2015   COPD suggested by initial evaluation 05/17/2015   Essential hypertension 05/17/2015   Smoking 05/17/2015    ONSET DATE: 08/09/2023   REFERRING DIAG: I63.9 (ICD-10-CM) - Cerebral infarction, unspecified   THERAPY DIAG:   Aphasia  Apraxia  Rationale for Evaluation and Treatment Rehabilitation  SUBJECTIVE:   PERTINENT HISTORY and DIAGNOSTIC FINDINGS: Pt is a 73 year old right handed male who presented to [facility] ED on 08/09/2023 with stroke-like symptoms - slurred speech and difficulty answering questions and weakness to his right side extremities. CT head and subsequent CTA head and neck shows left M2 occlusion with large penumbra in the region. Pt transferred to [facility] for mechanical thrombectomy on 08/10/2023. Pt admitted at [facility] from 08/10/2023 thru 08/21/2023 with recommended for home health.   MRI brain: Remonstrated left MCA territory infarct primarily involving the parietal lobe.   Additional medical history includes: blindness left eye, A-fib on warfarin, COPD, HFpEF, HTN, current smoker, ethanol abuse, RLL adenocarcinoma s/p SBRT (2024)    PAIN:  Are you having pain? Unable to communicate  FALLS: Has patient fallen in last 6 months?  No  LIVING ENVIRONMENT: Lives with: sister-in-law Lives in: House/apartment  PLOF:  Level of assistance: Independent with ADLs, Independent with IADLs, Comment: poor health literacy, difficulty with reading and writing per family report Employment: Retired   PATIENT GOALS    per pt's sister-in-law, she hopes he will talk again  SUBJECTIVE STATEMENT: Pt pleasant Pt accompanied by: sister-in-law  OBJECTIVE:   TODAY'S TREATMENT:  Skilled ST treatment session targeted pt's aphasia and apraxia of speech goals. SLP facilitated session by providing the following interventions:  SIL reports doctor appointment yesterday and referral for PT.   To increase comprehension skills, pictures of everyday items paired with their words were presented in the following:  SLP verbally presented each picture and written word. Pt then said each word with the clinician. The last round consisted of pt saying word by self.  When verbalized with SLP: Achieved 8 out of 10 trials  independently, improved to 10 out of 10 given mod A.  When verbalized by self: Achieved 5 out of 10 trials independently, improved to 8 out of 10 given min to mod A. Pt benefited from phonemic, visual, and verbal cues.  Given index card with pt's name, pt able to independently produce first and last name intelligibly. With moderate to max cues, pt able to produce dog and sister in law's name.  Receptive Language further targeted thru: Stage 3 - Given 3 pictures of everyday items, pt asked to point, in sequence, to 2 objects by function. Pt independently pointed to 6 out of 10 items. Improved to 10 out of 10 given minimal to moderate visual and verbal cues.  Stage 4: Given 3 pictures of everyday items, pt asked to point, in sequence, to 2 objects by name. Pt independently pointed to 8 out of 10 items. Improved to 10 out of 10 given minimal visual and verbal cues.   PATIENT EDUCATION: Education details: see above Person educated: Patient and his sister-in-law Education method: Explanation Education comprehension: needs further education  HOME EXERCISE PROGRAM:   Supplement verbal information with single written words  GOALS:  Goals reviewed with  patient? Yes  SHORT TERM GOALS: Target date: 10 sessions  Updated 12/25/2023  Updated: 11/15/2023 Updated: 10/10/2023 With maximal assistance, pt will select written word to express request for food/drink item in 7 out of 10 opportunities.  Baseline: Goal status: INITIAL: MET  2.  With maximal multimodal assistance, pt will answer yes/no questions in 8 out of 10 opportunities.  Baseline:  Goal status: INITIAL: Is able to answer using written language when choosing between written yes/no: MET  3.  With maximal multimodal assistance, pt will verbally approximate common object names in 5 out of 10 opportunities.  Baseline:  Goal status: INITIAL: progress made: MET  4. With minimal assistance, pt will point to 1 common object by function in  a field of 3 with 90% accuracy.  Baseline:  Goal status: INITIAL: progress made: With minimal assistance, pt will point to 1 common object by function in a field of 3 with 80% accuracy in 3 out of 5 sessions.     5. With minimal to moderate assistance, pt will point, in sequence to 2 common objects by function in field of 3 with 90% accuracy.   Baseline:  Goal status: INITIAL: progress made: With minimal to moderate assistance, pt will point, in sequence to 2 common objects by function in field of 3 with 80% accuracy in 3 out of 5 sessions.     6. With minimal to moderate assistance pt will verablly approximate names of basic functional objects with > 75% speech intelligiblity at the word level.    Baseline:  Goal status: INITIAL: progress made, continue targeting: With minimal to moderate assistance pt with verablly approximate names of basic functional objects with > 75% speech intelligiblity at the word level in 3 out of 5 sessions.    7. With minimal assistance, pt will identify body parts with 90% accuracy.   Baseline:  Goal status: INITIAL: MET  8. With moderate assistance, pt will follow 1 step directions with 75% accuracy. Baseline: Goal status: INITIAL: MET  9. With minimal assistance, pt will point, in sequence, to 2 common objects by name from a field of 3 with 80% accuracy across 3 consecutive sessions.  Baseline:  Goal status: INITIAL:   LONG TERM GOALS: Target date: 11/20/2023  Updated 12/25/2023  Updated: 11/15/2023 Updated: 10/10/2023 Pt will use multimodal communication and maximal assistance to communicate basic wants and needs in 2 out of 5 opportunities per pt and family report.  Baseline:  Goal status: INITIAL: progress, continue targeting  2.  With Maximal A, patient/family will demonstrate understanding of the following concepts: aphasia, spontaneous recovery, communication vs conversation, strengths/strategies to promote success, local resources by answering  multiple choice questions with 50% accuracy when provided supported conversation in order to increase patient and caregiver's participation in medical care.  Baseline:  Goal status: INITIAL: progress, continue targeting   ASSESSMENT:  CLINICAL IMPRESSION: Patient is a 73 y.o. right handed male who was seen today for a speech language treatment d/t left MCA CVA.   Pt presents with improving language impairment, now conceptualized as moderate to severe Wernicke's Aphasia (as per WAB-R, above).   Verbal Communication Pt's verbal communication continues to be fluent but increased number of islands of intelligible speech especially when commenting.   Auditory Comprehension Pt with improved auditory comprehension as evidenced by his ability to select objects in field of 4, answer basic to progressing semi-complex yes/no questions and following 1 step directions.   Written Language Pt with improved letter recognition and ability  to copy written words.   Reading Comprehension Continues to be a strength for pt. Utilized to supplement receptive language deficits.   See the above treatment note for details.   OBJECTIVE IMPAIRMENTS include expressive language, receptive language, aphasia, apraxia, and written language. These impairments are limiting patient from managing medications, managing appointments, managing finances, household responsibilities, ADLs/IADLs, and effectively communicating at home and in community. Factors affecting potential to achieve goals and functional outcome are severity of impairments, family/community support, and significantly reduced health literacy. Patient will benefit from skilled SLP services to address above impairments and improve overall function.  REHAB POTENTIAL: Good  PLAN: SLP FREQUENCY: 1-2x/week  SLP DURATION: 12 weeks  PLANNED INTERVENTIONS: Language facilitation, Cueing hierachy, Internal/external aids, Functional tasks, Multimodal communication  approach, SLP instruction and feedback, Compensatory strategies, and Patient/family education  Hassell Roys, SLP Student Clinician  Happi B. Rubbie, M.S., CCC-SLP, Tree Surgeon Certified Brain Injury Specialist [facility]  [facility] Rehabilitation Services Office [phone] Ascom [phone] Fax [phone]

## 2024-01-03 ENCOUNTER — Ambulatory Visit: Admitting: Speech Pathology

## 2024-01-03 ENCOUNTER — Encounter: Admitting: Occupational Therapy

## 2024-01-03 DIAGNOSIS — R482 Apraxia: Secondary | ICD-10-CM

## 2024-01-03 DIAGNOSIS — R4701 Aphasia: Secondary | ICD-10-CM | POA: Diagnosis not present

## 2024-01-03 NOTE — Therapy (Signed)
 OUTPATIENT SPEECH LANGUAGE PATHOLOGY  SPEECH LANGUAGE TREATMENT NOTE    Patient Name: Gregory Lane MRN: [id number] DOB:01/29/1951, 73 y.o., male 25 Date: 01/03/2024   PCP: Denyse DELENA Bathe, MD REFERRING PROVIDER: Arland Earnie Pouch, MD    End of Session - 01/03/24 1142     Visit Number 33    Number of Visits 44    Date for Recertification  02/07/24    Authorization Type [employer] [place]    Progress Note Due on Visit 40    SLP Start Time 1145    SLP Stop Time  1230    SLP Time Calculation (min) 45 min    Activity Tolerance Patient tolerated treatment well          Past Medical History:  Diagnosis Date   Anxiety    Aortic atherosclerosis 02/16/2017   Arthritis    Blind left eye    COPD (chronic obstructive pulmonary disease) (HCC)    Depression    Dysrhythmia    A-fib   GERD (gastroesophageal reflux disease)    Hypertension    Low TSH level 02/17/2017   Myocardial infarction [facility])    PAD (peripheral artery disease)    Paroxysmal atrial fibrillation (HCC)    Perforated duodenal ulcer (HCC)    Stroke (HCC)    left side is weak   Past Surgical History:  Procedure Laterality Date   BRONCHIAL BIOPSY  03/22/2022   Procedure: BRONCHIAL BIOPSIES;  Surgeon: Isadora Hose, MD;  [facility]: [facility];  Service: Pulmonary;;   BRONCHIAL NEEDLE ASPIRATION BIOPSY  03/22/2022   Procedure: BRONCHIAL NEEDLE ASPIRATION BIOPSIES;  Surgeon: Isadora Hose, MD;  [facility]: [facility];  Service: Pulmonary;;   ENDOBRONCHIAL ULTRASOUND  03/22/2022   Procedure: ENDOBRONCHIAL ULTRASOUND;  Surgeon: Isadora Hose, MD;  [facility]: [facility];  Service: Pulmonary;;   EXPLORATORY LAPAROTOMY  05/12/2015   EYE SURGERY     FRACTURE SURGERY     LAPAROTOMY N/A 05/10/2015   Procedure: EXPLORATORY LAPAROTOMY;  Surgeon: Lynda Leos, MD;  Location: [facility] OR;  Service: General;  Laterality: N/A;   LEFT HEART CATH AND CORONARY ANGIOGRAPHY N/A 05/02/2016   Procedure: Left Heart Cath  and Coronary Angiography;  Surgeon: Gordy Bergamo, MD;  Location: [facility] INVASIVE CV LAB;  Service: Cardiovascular;  Laterality: N/A;   LOWER EXTREMITY ANGIOGRAPHY Left 09/21/2021   Procedure: Lower Extremity Angiography;  Surgeon: Jama Cordella KANDICE, MD;  Location: [facility] INVASIVE CV LAB;  Service: Cardiovascular;  Laterality: Left;   Patient Active Problem List   Diagnosis Date Noted   Aphasia 12/31/2023   Arthritis of left shoulder 12/31/2023   Allergic rhinitis due to pollen 12/11/2022   Tinea cruris 12/11/2022   BPH associated with nocturia 12/11/2022   Chest pain, non-cardiac 04/11/2022   Adenocarcinoma of lower lobe of right lung (HCC) 03/28/2022   Pulmonary nodule 1 cm or greater in diameter 03/22/2022   Leg weakness, bilateral 02/09/2022   Osteopenia 10/13/2021   PAD (peripheral artery disease) 09/21/2021   GERD without esophagitis 09/21/2021   Dyslipidemia 09/21/2021   Depression 09/21/2021   Atrial flutter (HCC) 09/21/2021   Chronic obstructive pulmonary disease (COPD) (HCC) 09/21/2021   Coronary artery disease 09/21/2021   Cerebrovascular accident (CVA) due to embolism of cerebral artery (HCC) 09/06/2021   Bilateral edema of lower extremity 08/05/2020   Vasovagal syncope 08/02/2020   Annual physical exam 12/26/2019   Hyperlipidemia 12/26/2019   Abrasion of right hand 12/26/2019   Metabolic syndrome 11/11/2019   Low TSH level 02/17/2017  Atrial flutter with rapid ventricular response (HCC) 02/16/2017   Cigarette smoker 02/16/2017   COPD with acute exacerbation (HCC) 02/16/2017   Elevated lactic acid level 02/16/2017   Aortic atherosclerosis 02/16/2017   Productive cough    Hypoxia 05/24/2015   SOB (shortness of breath) 05/24/2015   COPD suggested by initial evaluation 05/17/2015   Essential hypertension 05/17/2015   Smoking 05/17/2015    ONSET DATE: 08/09/2023   REFERRING DIAG: I63.9 (ICD-10-CM) - Cerebral infarction, unspecified   THERAPY DIAG:   Aphasia  Apraxia  Rationale for Evaluation and Treatment Rehabilitation  SUBJECTIVE:   PERTINENT HISTORY and DIAGNOSTIC FINDINGS: Pt is a 73 year old right handed male who presented to [facility] ED on 08/09/2023 with stroke-like symptoms - slurred speech and difficulty answering questions and weakness to his right side extremities. CT head and subsequent CTA head and neck shows left M2 occlusion with large penumbra in the region. Pt transferred to [facility] for mechanical thrombectomy on 08/10/2023. Pt admitted at [facility] from 08/10/2023 thru 08/21/2023 with recommended for home health.   MRI brain: Remonstrated left MCA territory infarct primarily involving the parietal lobe.   Additional medical history includes: blindness left eye, A-fib on warfarin, COPD, HFpEF, HTN, current smoker, ethanol abuse, RLL adenocarcinoma s/p SBRT (2024)    PAIN:  Are you having pain? Unable to communicate  FALLS: Has patient fallen in last 6 months?  No  LIVING ENVIRONMENT: Lives with: sister-in-law Lives in: House/apartment  PLOF:  Level of assistance: Independent with ADLs, Independent with IADLs, Comment: poor health literacy, difficulty with reading and writing per family report Employment: Retired   PATIENT GOALS    per pt's sister-in-law, she hopes he will talk again  SUBJECTIVE STATEMENT: Pt pleasant Pt accompanied by: sister-in-law  OBJECTIVE:   TODAY'S TREATMENT:  Skilled ST treatment session targeted pt's aphasia and apraxia of speech goals. SLP facilitated session by providing the following interventions:  SIL reports doctor appointment yesterday and referral for PT.   To increase comprehension skills, pictures of everyday items paired with their words were presented in the following:  SLP verbally presented each picture and written word. Pt then said each word with the clinician. The last round consisted of pt saying word by self.  When verbalized with SLP: Achieved 8 out of 10 trials  independently, improved to 10 out of 10 given mod A.  When verbalized by self: Achieved 5 out of 10 trials independently, improved to 8 out of 10 given min to mod A. Pt benefited from phonemic, visual, and verbal cues.  Given index card with pt's name, pt able to independently produce first and last name intelligibly. With moderate to max cues, pt able to produce dog and sister in law's name.  Receptive Language further targeted thru: Stage 3 - Given 3 pictures of everyday items, pt asked to point, in sequence, to 2 objects by function. Pt independently pointed to 6 out of 10 items. Improved to 10 out of 10 given minimal to moderate visual and verbal cues.  Stage 4: Given 3 pictures of everyday items, pt asked to point, in sequence, to 2 objects by name. Pt independently pointed to 8 out of 10 items. Improved to 10 out of 10 given minimal visual and verbal cues.   PATIENT EDUCATION: Education details: see above Person educated: Patient and his sister-in-law Education method: Explanation Education comprehension: needs further education  HOME EXERCISE PROGRAM:   Supplement verbal information with single written words  GOALS:  Goals reviewed with  patient? Yes  SHORT TERM GOALS: Target date: 10 sessions  Updated 12/25/2023  Updated: 11/15/2023 Updated: 10/10/2023 With maximal assistance, pt will select written word to express request for food/drink item in 7 out of 10 opportunities.  Baseline: Goal status: INITIAL: MET  2.  With maximal multimodal assistance, pt will answer yes/no questions in 8 out of 10 opportunities.  Baseline:  Goal status: INITIAL: Is able to answer using written language when choosing between written yes/no: MET  3.  With maximal multimodal assistance, pt will verbally approximate common object names in 5 out of 10 opportunities.  Baseline:  Goal status: INITIAL: progress made: MET  4. With minimal assistance, pt will point to 1 common object by function in  a field of 3 with 90% accuracy.  Baseline:  Goal status: INITIAL: progress made: With minimal assistance, pt will point to 1 common object by function in a field of 3 with 80% accuracy in 3 out of 5 sessions.     5. With minimal to moderate assistance, pt will point, in sequence to 2 common objects by function in field of 3 with 90% accuracy.   Baseline:  Goal status: INITIAL: progress made: With minimal to moderate assistance, pt will point, in sequence to 2 common objects by function in field of 3 with 80% accuracy in 3 out of 5 sessions.     6. With minimal to moderate assistance pt will verablly approximate names of basic functional objects with > 75% speech intelligiblity at the word level.    Baseline:  Goal status: INITIAL: progress made, continue targeting: With minimal to moderate assistance pt with verablly approximate names of basic functional objects with > 75% speech intelligiblity at the word level in 3 out of 5 sessions.    7. With minimal assistance, pt will identify body parts with 90% accuracy.   Baseline:  Goal status: INITIAL: MET  8. With moderate assistance, pt will follow 1 step directions with 75% accuracy. Baseline: Goal status: INITIAL: MET  9. With minimal assistance, pt will point, in sequence, to 2 common objects by name from a field of 3 with 80% accuracy across 3 consecutive sessions.  Baseline:  Goal status: INITIAL:   LONG TERM GOALS: Target date: 11/20/2023  Updated 12/25/2023  Updated: 11/15/2023 Updated: 10/10/2023 Pt will use multimodal communication and maximal assistance to communicate basic wants and needs in 2 out of 5 opportunities per pt and family report.  Baseline:  Goal status: INITIAL: progress, continue targeting  2.  With Maximal A, patient/family will demonstrate understanding of the following concepts: aphasia, spontaneous recovery, communication vs conversation, strengths/strategies to promote success, local resources by answering  multiple choice questions with 50% accuracy when provided supported conversation in order to increase patient and caregiver's participation in medical care.  Baseline:  Goal status: INITIAL: progress, continue targeting   ASSESSMENT:  CLINICAL IMPRESSION: Patient is a 73 y.o. right handed male who was seen today for a speech language treatment d/t left MCA CVA.   Pt presents with improving language impairment, now conceptualized as moderate to severe Wernicke's Aphasia (as per WAB-R, above).   Verbal Communication Pt's verbal communication continues to be fluent but increased number of islands of intelligible speech especially when commenting.   Auditory Comprehension Pt with improved auditory comprehension as evidenced by his ability to select objects in field of 4, answer basic to progressing semi-complex yes/no questions and following 1 step directions.   Written Language Pt with improved letter recognition and ability  to copy written words.   Reading Comprehension Continues to be a strength for pt. Utilized to supplement receptive language deficits.   See the above treatment note for details.   OBJECTIVE IMPAIRMENTS include expressive language, receptive language, aphasia, apraxia, and written language. These impairments are limiting patient from managing medications, managing appointments, managing finances, household responsibilities, ADLs/IADLs, and effectively communicating at home and in community. Factors affecting potential to achieve goals and functional outcome are severity of impairments, family/community support, and significantly reduced health literacy. Patient will benefit from skilled SLP services to address above impairments and improve overall function.  REHAB POTENTIAL: Good  PLAN: SLP FREQUENCY: 1-2x/week  SLP DURATION: 12 weeks  PLANNED INTERVENTIONS: Language facilitation, Cueing hierachy, Internal/external aids, Functional tasks, Multimodal communication  approach, SLP instruction and feedback, Compensatory strategies, and Patient/family education  Hassell Roys, SLP Student Clinician  Happi B. Rubbie, M.S., CCC-SLP, Tree Surgeon Certified Brain Injury Specialist [facility]  [facility] Rehabilitation Services Office [phone] Ascom [phone] Fax [phone]

## 2024-01-03 NOTE — Therapy (Signed)
 OUTPATIENT SPEECH LANGUAGE PATHOLOGY  SPEECH LANGUAGE TREATMENT NOTE    Patient Name: Gregory Lane MRN: [id number] DOB:Sep 04, 1950, 73 y.o., male 83 Date: 01/03/2024   PCP: Denyse DELENA Bathe, MD REFERRING PROVIDER: Arland Earnie Pouch, MD    End of Session - 01/03/24 1142     Visit Number 33    Number of Visits 44    Date for Recertification  02/07/24    Authorization Type [employer]    Progress Note Due on Visit 40    SLP Start Time 1145    SLP Stop Time  1230    SLP Time Calculation (min) 45 min    Activity Tolerance Patient tolerated treatment well          Past Medical History:  Diagnosis Date   Anxiety    Aortic atherosclerosis 02/16/2017   Arthritis    Blind left eye    COPD (chronic obstructive pulmonary disease) (HCC)    Depression    Dysrhythmia    A-fib   GERD (gastroesophageal reflux disease)    Hypertension    Low TSH level 02/17/2017   Myocardial infarction [facility])    PAD (peripheral artery disease)    Paroxysmal atrial fibrillation (HCC)    Perforated duodenal ulcer (HCC)    Stroke (HCC)    left side is weak   Past Surgical History:  Procedure Laterality Date   BRONCHIAL BIOPSY  03/22/2022   Procedure: BRONCHIAL BIOPSIES;  Surgeon: Isadora Hose, MD;  Location: [facility] ENDOSCOPY;  Service: Pulmonary;;   BRONCHIAL NEEDLE ASPIRATION BIOPSY  03/22/2022   Procedure: BRONCHIAL NEEDLE ASPIRATION BIOPSIES;  Surgeon: Isadora Hose, MD;  Location: [facility] ENDOSCOPY;  Service: Pulmonary;;   ENDOBRONCHIAL ULTRASOUND  03/22/2022   Procedure: ENDOBRONCHIAL ULTRASOUND;  Surgeon: Isadora Hose, MD;  Location: [facility] ENDOSCOPY;  Service: Pulmonary;;   EXPLORATORY LAPAROTOMY  05/12/2015   EYE SURGERY     FRACTURE SURGERY     LAPAROTOMY N/A 05/10/2015   Procedure: EXPLORATORY LAPAROTOMY;  Surgeon: Lynda Leos, MD;  Location: [facility] OR;  Service: General;  Laterality: N/A;   LEFT HEART CATH AND CORONARY ANGIOGRAPHY N/A 05/02/2016   Procedure: Left Heart Cath  and Coronary Angiography;  Surgeon: Gordy Bergamo, MD;  Location: [facility] INVASIVE CV LAB;  Service: Cardiovascular;  Laterality: N/A;   LOWER EXTREMITY ANGIOGRAPHY Left 09/21/2021   Procedure: Lower Extremity Angiography;  Surgeon: Jama Cordella KANDICE, MD;  Location: [facility] INVASIVE CV LAB;  Service: Cardiovascular;  Laterality: Left;   Patient Active Problem List   Diagnosis Date Noted   Aphasia 12/31/2023   Arthritis of left shoulder 12/31/2023   Allergic rhinitis due to pollen 12/11/2022   Tinea cruris 12/11/2022   BPH associated with nocturia 12/11/2022   Chest pain, non-cardiac 04/11/2022   Adenocarcinoma of lower lobe of right lung (HCC) 03/28/2022   Pulmonary nodule 1 cm or greater in diameter 03/22/2022   Leg weakness, bilateral 02/09/2022   Osteopenia 10/13/2021   PAD (peripheral artery disease) 09/21/2021   GERD without esophagitis 09/21/2021   Dyslipidemia 09/21/2021   Depression 09/21/2021   Atrial flutter (HCC) 09/21/2021   Chronic obstructive pulmonary disease (COPD) (HCC) 09/21/2021   Coronary artery disease 09/21/2021   Cerebrovascular accident (CVA) due to embolism of cerebral artery (HCC) 09/06/2021   Bilateral edema of lower extremity 08/05/2020   Vasovagal syncope 08/02/2020   Annual physical exam 12/26/2019   Hyperlipidemia 12/26/2019   Abrasion of right hand 12/26/2019   Metabolic syndrome 11/11/2019   Low TSH level 02/17/2017  Atrial flutter with rapid ventricular response (HCC) 02/16/2017   Cigarette smoker 02/16/2017   COPD with acute exacerbation (HCC) 02/16/2017   Elevated lactic acid level 02/16/2017   Aortic atherosclerosis 02/16/2017   Productive cough    Hypoxia 05/24/2015   SOB (shortness of breath) 05/24/2015   COPD suggested by initial evaluation 05/17/2015   Essential hypertension 05/17/2015   Smoking 05/17/2015    ONSET DATE: 08/09/2023   REFERRING DIAG: I63.9 (ICD-10-CM) - Cerebral infarction, unspecified   THERAPY DIAG:   Aphasia  Apraxia  Rationale for Evaluation and Treatment Rehabilitation  SUBJECTIVE:   PERTINENT HISTORY and DIAGNOSTIC FINDINGS: Pt is a 73 year old right handed male who presented to [facility] ED on 08/09/2023 with stroke-like symptoms - slurred speech and difficulty answering questions and weakness to his right side extremities. CT head and subsequent CTA head and neck shows left M2 occlusion with large penumbra in the region. Pt transferred to [facility] for mechanical thrombectomy on 08/10/2023. Pt admitted at [facility] from 08/10/2023 thru 08/21/2023 with recommended for home health.   MRI brain: Remonstrated left MCA territory infarct primarily involving the parietal lobe.   Additional medical history includes: blindness left eye, A-fib on warfarin, COPD, HFpEF, HTN, current smoker, ethanol abuse, RLL adenocarcinoma s/p SBRT (2024)    PAIN:  Are you having pain? Unable to communicate  FALLS: Has patient fallen in last 6 months?  No  LIVING ENVIRONMENT: Lives with: sister-in-law Lives in: House/apartment  PLOF:  Level of assistance: Independent with ADLs, Independent with IADLs, Comment: poor health literacy, difficulty with reading and writing per family report Employment: Retired   PATIENT GOALS    per pt's sister-in-law, she hopes he will talk again  SUBJECTIVE STATEMENT: Pt pleasant Pt accompanied by: sister-in-law  OBJECTIVE:   TODAY'S TREATMENT:  Skilled ST treatment session targeted pt's aphasia and apraxia of speech goals. SLP facilitated session by providing the following interventions:  Sister-in-law reports He's been doing good. I understand a lot of what he's trying to say.  To increase comprehension skills, new pictures of thanksgiving foods (as given by his sister-in-law that she prepares for the holiday) paired with their words were presented in the following:  SLP verbally presented each picture and written word. Pt then said each word with the clinician. The last  round consisted of pt saying word by self.  When verbalized with SLP: Achieved 8 out of 12 trials independently, improved to 11 out of 12 given mod to max A.  When verbalized by self: Achieved 7 out of 12 trials independently, improved to 10 out of 12 given min to max A. Pt benefited from phonemic, visual, and verbal cues.  Given index card with pt's name, pt able to independently produce last name and sister in law's name intelligibly. With moderate to max phonemic and semantic cues, pt able to produce dog's name and first name.  Receptive Language further targeted thru: Stage 2 - Given 3 pictures of everyday items, pt asked to point to 1 item by function. Pt independently pointed to 9 out of 12 items. Improved to 12 out of 12 given minimal to moderate visual and verbal cues.  Stage 4 - Given 3 pictures of everyday items, pt asked to point, in sequence, to 2 objects by name. Pt independently pointed to 9 out of 11 items. Improved to 11 out of 11 given maximum visual and verbal cues.   PATIENT EDUCATION: Education details: see above Person educated: Patient and his sister-in-law Education method: Explanation  Education comprehension: needs further education  HOME EXERCISE PROGRAM:   Supplement verbal information with single written words  GOALS:  Goals reviewed with patient? Yes  SHORT TERM GOALS: Target date: 10 sessions  Updated 12/25/2023  Updated: 11/15/2023 Updated: 10/10/2023 With maximal assistance, pt will select written word to express request for food/drink item in 7 out of 10 opportunities.  Baseline: Goal status: INITIAL: MET  2.  With maximal multimodal assistance, pt will answer yes/no questions in 8 out of 10 opportunities.  Baseline:  Goal status: INITIAL: Is able to answer using written language when choosing between written yes/no: MET  3.  With maximal multimodal assistance, pt will verbally approximate common object names in 5 out of 10 opportunities.   Baseline:  Goal status: INITIAL: progress made: MET  4. With minimal assistance, pt will point to 1 common object by function in a field of 3 with 90% accuracy.  Baseline:  Goal status: INITIAL: progress made: With minimal assistance, pt will point to 1 common object by function in a field of 3 with 80% accuracy in 3 out of 5 sessions.     5. With minimal to moderate assistance, pt will point, in sequence to 2 common objects by function in field of 3 with 90% accuracy.   Baseline:  Goal status: INITIAL: progress made: With minimal to moderate assistance, pt will point, in sequence to 2 common objects by function in field of 3 with 80% accuracy in 3 out of 5 sessions.     6. With minimal to moderate assistance pt will verablly approximate names of basic functional objects with > 75% speech intelligiblity at the word level.    Baseline:  Goal status: INITIAL: progress made, continue targeting: With minimal to moderate assistance pt with verablly approximate names of basic functional objects with > 75% speech intelligiblity at the word level in 3 out of 5 sessions.    7. With minimal assistance, pt will identify body parts with 90% accuracy.   Baseline:  Goal status: INITIAL: MET  8. With moderate assistance, pt will follow 1 step directions with 75% accuracy. Baseline: Goal status: INITIAL: MET  9. With minimal assistance, pt will point, in sequence, to 2 common objects by name from a field of 3 with 80% accuracy across 3 consecutive sessions.  Baseline:  Goal status: INITIAL:   LONG TERM GOALS: Target date: 11/20/2023  Updated 12/25/2023  Updated: 11/15/2023 Updated: 10/10/2023 Pt will use multimodal communication and maximal assistance to communicate basic wants and needs in 2 out of 5 opportunities per pt and family report.  Baseline:  Goal status: INITIAL: progress, continue targeting  2.  With Maximal A, patient/family will demonstrate understanding of the following concepts:  aphasia, spontaneous recovery, communication vs conversation, strengths/strategies to promote success, local resources by answering multiple choice questions with 50% accuracy when provided supported conversation in order to increase patient and caregiver's participation in medical care.  Baseline:  Goal status: INITIAL: progress, continue targeting   ASSESSMENT:  CLINICAL IMPRESSION: Patient is a 73 y.o. right handed male who was seen today for a speech language treatment d/t left MCA CVA.   Pt presents with improving language impairment, now conceptualized as moderate to severe Wernicke's Aphasia (as per WAB-R, above).   Verbal Communication Pt's verbal communication continues to be fluent but increased number of islands of intelligible speech especially when commenting.   Auditory Comprehension Pt with improved auditory comprehension as evidenced by his ability to select objects in field of  4, answer basic to progressing semi-complex yes/no questions and following 1 step directions.   Written Language Pt with improved letter recognition and ability to copy written words.   Reading Comprehension Continues to be a strength for pt. Utilized to supplement receptive language deficits.   See the above treatment note for details.   OBJECTIVE IMPAIRMENTS include expressive language, receptive language, aphasia, apraxia, and written language. These impairments are limiting patient from managing medications, managing appointments, managing finances, household responsibilities, ADLs/IADLs, and effectively communicating at home and in community. Factors affecting potential to achieve goals and functional outcome are severity of impairments, family/community support, and significantly reduced health literacy. Patient will benefit from skilled SLP services to address above impairments and improve overall function.  REHAB POTENTIAL: Good  PLAN: SLP FREQUENCY: 1-2x/week  SLP DURATION: 12  weeks  PLANNED INTERVENTIONS: Language facilitation, Cueing hierachy, Internal/external aids, Functional tasks, Multimodal communication approach, SLP instruction and feedback, Compensatory strategies, and Patient/family education  Hassell Roys, SLP Student Clinician  Happi B. Rubbie, M.S., CCC-SLP, Tree Surgeon Certified Brain Injury Specialist [facility]  [facility] Rehabilitation Services Office [phone] Ascom [phone] Fax [phone]

## 2024-01-08 ENCOUNTER — Ambulatory Visit: Admitting: Speech Pathology

## 2024-01-08 ENCOUNTER — Encounter: Admitting: Occupational Therapy

## 2024-01-08 DIAGNOSIS — R4701 Aphasia: Secondary | ICD-10-CM

## 2024-01-08 NOTE — Therapy (Signed)
 OUTPATIENT SPEECH LANGUAGE PATHOLOGY  SPEECH LANGUAGE TREATMENT NOTE    Patient Name: Gregory Lane MRN: [id number] DOB:[birth year], 73 y.o., male 1 Date: 01/08/2024   PCP: Denyse DELENA Bathe, MD REFERRING PROVIDER: Arland Earnie Pouch, MD    End of Session - 01/08/24 1355     Visit Number 34    Number of Visits 44    Date for Recertification  02/07/24    Authorization Type [employer] [place]    Progress Note Due on Visit 40    SLP Start Time 1315    SLP Stop Time  1355    SLP Time Calculation (min) 40 min    Activity Tolerance Patient tolerated treatment well          Past Medical History:  Diagnosis Date   Anxiety    Aortic atherosclerosis 02/16/2017   Arthritis    Blind left eye    COPD (chronic obstructive pulmonary disease) (HCC)    Depression    Dysrhythmia    A-fib   GERD (gastroesophageal reflux disease)    Hypertension    Low TSH level 02/17/2017   Myocardial infarction [facility])    PAD (peripheral artery disease)    Paroxysmal atrial fibrillation (HCC)    Perforated duodenal ulcer (HCC)    Stroke (HCC)    left side is weak   Past Surgical History:  Procedure Laterality Date   BRONCHIAL BIOPSY  03/22/2022   Procedure: BRONCHIAL BIOPSIES;  Surgeon: Isadora Hose, MD;  Location: [facility] ENDOSCOPY;  Service: Pulmonary;;   BRONCHIAL NEEDLE ASPIRATION BIOPSY  03/22/2022   Procedure: BRONCHIAL NEEDLE ASPIRATION BIOPSIES;  Surgeon: Isadora Hose, MD;  Location: [facility] ENDOSCOPY;  Service: Pulmonary;;   ENDOBRONCHIAL ULTRASOUND  03/22/2022   Procedure: ENDOBRONCHIAL ULTRASOUND;  Surgeon: Isadora Hose, MD;  Location: [facility] ENDOSCOPY;  Service: Pulmonary;;   EXPLORATORY LAPAROTOMY  05/12/2015   EYE SURGERY     FRACTURE SURGERY     LAPAROTOMY N/A 05/10/2015   Procedure: EXPLORATORY LAPAROTOMY;  Surgeon: Lynda Leos, MD;  Location: [facility] OR;  Service: General;  Laterality: N/A;   LEFT HEART CATH AND CORONARY ANGIOGRAPHY N/A 05/02/2016   Procedure: Left Heart Cath  and Coronary Angiography;  Surgeon: Gordy Bergamo, MD;  Location: [facility] INVASIVE CV LAB;  Service: Cardiovascular;  Laterality: N/A;   LOWER EXTREMITY ANGIOGRAPHY Left 09/21/2021   Procedure: Lower Extremity Angiography;  Surgeon: Jama Cordella KANDICE, MD;  Location: [facility] INVASIVE CV LAB;  Service: Cardiovascular;  Laterality: Left;   Patient Active Problem List   Diagnosis Date Noted   Aphasia 12/31/2023   Arthritis of left shoulder 12/31/2023   Allergic rhinitis due to pollen 12/11/2022   Tinea cruris 12/11/2022   BPH associated with nocturia 12/11/2022   Chest pain, non-cardiac 04/11/2022   Adenocarcinoma of lower lobe of right lung (HCC) 03/28/2022   Pulmonary nodule 1 cm or greater in diameter 03/22/2022   Leg weakness, bilateral 02/09/2022   Osteopenia 10/13/2021   PAD (peripheral artery disease) 09/21/2021   GERD without esophagitis 09/21/2021   Dyslipidemia 09/21/2021   Depression 09/21/2021   Atrial flutter (HCC) 09/21/2021   Chronic obstructive pulmonary disease (COPD) (HCC) 09/21/2021   Coronary artery disease 09/21/2021   Cerebrovascular accident (CVA) due to embolism of cerebral artery (HCC) 09/06/2021   Bilateral edema of lower extremity 08/05/2020   Vasovagal syncope 08/02/2020   Annual physical exam 12/26/2019   Hyperlipidemia 12/26/2019   Abrasion of right hand 12/26/2019   Metabolic syndrome 11/11/2019   Low TSH level 02/17/2017  Atrial flutter with rapid ventricular response (HCC) 02/16/2017   Cigarette smoker 02/16/2017   COPD with acute exacerbation (HCC) 02/16/2017   Elevated lactic acid level 02/16/2017   Aortic atherosclerosis 02/16/2017   Productive cough    Hypoxia 05/24/2015   SOB (shortness of breath) 05/24/2015   COPD suggested by initial evaluation 05/17/2015   Essential hypertension 05/17/2015   Smoking 05/17/2015    ONSET DATE: 08/09/2023   REFERRING DIAG: I63.9 (ICD-10-CM) - Cerebral infarction, unspecified   THERAPY DIAG:   Aphasia  Apraxia  Rationale for Evaluation and Treatment Rehabilitation  SUBJECTIVE:   PERTINENT HISTORY and DIAGNOSTIC FINDINGS: Pt is a 73 year old right handed male who presented to [facility] ED on 08/09/2023 with stroke-like symptoms - slurred speech and difficulty answering questions and weakness to his right side extremities. CT head and subsequent CTA head and neck shows left M2 occlusion with large penumbra in the region. Pt transferred to [facility] for mechanical thrombectomy on 08/10/2023. Pt admitted at [facility] from 08/10/2023 thru 08/21/2023 with recommended for home health.   MRI brain: Remonstrated left MCA territory infarct primarily involving the parietal lobe.   Additional medical history includes: blindness left eye, A-fib on warfarin, COPD, HFpEF, HTN, current smoker, ethanol abuse, RLL adenocarcinoma s/p SBRT (2024)    PAIN:  Are you having pain? Unable to communicate  FALLS: Has patient fallen in last 6 months?  No  LIVING ENVIRONMENT: Lives with: sister-in-law Lives in: House/apartment  PLOF:  Level of assistance: Independent with ADLs, Independent with IADLs, Comment: poor health literacy, difficulty with reading and writing per family report Employment: Retired   PATIENT GOALS    per pt's sister-in-law, she hopes he will talk again  SUBJECTIVE STATEMENT: Pt pleasant Pt accompanied by: sister-in-law  OBJECTIVE:   TODAY'S TREATMENT:  Skilled ST treatment session targeted pt's aphasia and apraxia of speech goals. SLP facilitated session by providing the following interventions:  To increase comprehension skills, pictures of thanksgiving foods (as given by his sister-in-law that she prepares for the holiday) paired with their words were presented in the following:  SLP verbally presented each picture and written word. Pt then said each word with the clinician. The last round consisted of pt saying word by self.  When verbalized with SLP: Achieved 6 out of 12 trials  independently, improved to 9 out of 12 given min to mod A.  When verbalized by self: Achieved 8 out of 12 trials independently, improved to 10 out of 12 given max A. Pt benefited from phonemic, visual, and verbal cues.  Given index card with pt's name, pt able to independently produce last name and dogs name intelligibly. With maximum visual, phonemic and semantic cues, pt able to produce sister in law's name and first name.  Receptive Language further targeted thru: Stage 1 - Given 3 pictures of thanksgiving foods, pt asked to point to 1 item by name. Pt independently pointed to 11 out of 12 items. Improved to 12 out of 12 given maximal visual and verbal cues.  Stage 2 - Given 3 pictures of thanksgiving foods, pt asked to point to 1 item by function. Pt independently pointed to 8 out of 12 items. Improved to 11 out of 12 given moderate to maximum visual and verbal cues.  Given 4 picture choices, pt asked to point to 2 pictures by category. Pt independently pointed to 8 out of 8 items.    Pt with increased word approximations - much improved.   PATIENT EDUCATION: Education details:  see above Person educated: Patient and his sister-in-law Education method: Explanation Education comprehension: needs further education  HOME EXERCISE PROGRAM:   Supplement verbal information with single written words  GOALS:  Goals reviewed with patient? Yes  SHORT TERM GOALS: Target date: 10 sessions  Updated 12/25/2023  Updated: 11/15/2023 Updated: 10/10/2023 With maximal assistance, pt will select written word to express request for food/drink item in 7 out of 10 opportunities.  Baseline: Goal status: INITIAL: MET  2.  With maximal multimodal assistance, pt will answer yes/no questions in 8 out of 10 opportunities.  Baseline:  Goal status: INITIAL: Is able to answer using written language when choosing between written yes/no: MET  3.  With maximal multimodal assistance, pt will verbally  approximate common object names in 5 out of 10 opportunities.  Baseline:  Goal status: INITIAL: progress made: MET  4. With minimal assistance, pt will point to 1 common object by function in a field of 3 with 90% accuracy.  Baseline:  Goal status: INITIAL: progress made: With minimal assistance, pt will point to 1 common object by function in a field of 3 with 80% accuracy in 3 out of 5 sessions.     5. With minimal to moderate assistance, pt will point, in sequence to 2 common objects by function in field of 3 with 90% accuracy.   Baseline:  Goal status: INITIAL: progress made: With minimal to moderate assistance, pt will point, in sequence to 2 common objects by function in field of 3 with 80% accuracy in 3 out of 5 sessions.     6. With minimal to moderate assistance pt will verablly approximate names of basic functional objects with > 75% speech intelligiblity at the word level.    Baseline:  Goal status: INITIAL: progress made, continue targeting: With minimal to moderate assistance pt with verablly approximate names of basic functional objects with > 75% speech intelligiblity at the word level in 3 out of 5 sessions.    7. With minimal assistance, pt will identify body parts with 90% accuracy.   Baseline:  Goal status: INITIAL: MET  8. With moderate assistance, pt will follow 1 step directions with 75% accuracy. Baseline: Goal status: INITIAL: MET  9. With minimal assistance, pt will point, in sequence, to 2 common objects by name from a field of 3 with 80% accuracy across 3 consecutive sessions.  Baseline:  Goal status: INITIAL:   LONG TERM GOALS: Target date: 11/20/2023  Updated 12/25/2023  Updated: 11/15/2023 Updated: 10/10/2023 Pt will use multimodal communication and maximal assistance to communicate basic wants and needs in 2 out of 5 opportunities per pt and family report.  Baseline:  Goal status: INITIAL: progress, continue targeting  2.  With Maximal A,  patient/family will demonstrate understanding of the following concepts: aphasia, spontaneous recovery, communication vs conversation, strengths/strategies to promote success, local resources by answering multiple choice questions with 50% accuracy when provided supported conversation in order to increase patient and caregiver's participation in medical care.  Baseline:  Goal status: INITIAL: progress, continue targeting   ASSESSMENT:  CLINICAL IMPRESSION: Patient is a 73 y.o. right handed male who was seen today for a speech language treatment d/t left MCA CVA.   Pt presents with improving language impairment, now conceptualized as moderate to severe Wernicke's Aphasia (as per WAB-R, above).   Verbal Communication Pt's verbal communication continues to be fluent but increased number of islands of intelligible speech especially when commenting.   Auditory Comprehension Pt with improved auditory comprehension  as evidenced by his ability to select objects in field of 4, answer basic to progressing semi-complex yes/no questions and following 1 step directions.   Written Language Pt with improved letter recognition and ability to copy written words.   Reading Comprehension Continues to be a strength for pt. Utilized to supplement receptive language deficits.   See the above treatment note for details.   OBJECTIVE IMPAIRMENTS include expressive language, receptive language, aphasia, apraxia, and written language. These impairments are limiting patient from managing medications, managing appointments, managing finances, household responsibilities, ADLs/IADLs, and effectively communicating at home and in community. Factors affecting potential to achieve goals and functional outcome are severity of impairments, family/community support, and significantly reduced health literacy. Patient will benefit from skilled SLP services to address above impairments and improve overall function.  REHAB  POTENTIAL: Good  PLAN: SLP FREQUENCY: 1-2x/week  SLP DURATION: 12 weeks  PLANNED INTERVENTIONS: Language facilitation, Cueing hierachy, Internal/external aids, Functional tasks, Multimodal communication approach, SLP instruction and feedback, Compensatory strategies, and Patient/family education  Hassell Roys, SLP Student Clinician  Happi B. Rubbie, M.S., CCC-SLP, Tree Surgeon Certified Brain Injury Specialist [facility]  [facility] Rehabilitation Services Office [phone] Ascom [phone] Fax [phone]

## 2024-01-10 ENCOUNTER — Encounter: Admitting: Occupational Therapy

## 2024-01-10 ENCOUNTER — Ambulatory Visit: Admitting: Speech Pathology

## 2024-01-14 ENCOUNTER — Ambulatory Visit: Admitting: Internal Medicine

## 2024-01-14 ENCOUNTER — Ambulatory Visit: Payer: Self-pay | Admitting: Internal Medicine

## 2024-01-14 VITALS — BP 152/76 | HR 54 | Temp 97.9°F | Ht 73.0 in | Wt 188.8 lb

## 2024-01-14 DIAGNOSIS — M19012 Primary osteoarthritis, left shoulder: Secondary | ICD-10-CM | POA: Diagnosis not present

## 2024-01-14 DIAGNOSIS — I6340 Cerebral infarction due to embolism of unspecified cerebral artery: Secondary | ICD-10-CM

## 2024-01-14 DIAGNOSIS — I679 Cerebrovascular disease, unspecified: Secondary | ICD-10-CM | POA: Diagnosis not present

## 2024-01-14 DIAGNOSIS — Z8673 Personal history of transient ischemic attack (TIA), and cerebral infarction without residual deficits: Secondary | ICD-10-CM

## 2024-01-14 DIAGNOSIS — J301 Allergic rhinitis due to pollen: Secondary | ICD-10-CM | POA: Diagnosis not present

## 2024-01-14 DIAGNOSIS — I10 Essential (primary) hypertension: Secondary | ICD-10-CM

## 2024-01-14 MED ORDER — CETIRIZINE HCL 10 MG PO TABS: 10.0000 mg | ORAL_TABLET | Freq: Every day | ORAL | 1 refills | Status: AC

## 2024-01-14 MED ORDER — CELECOXIB 200 MG PO CAPS: 200.0000 mg | ORAL_CAPSULE | Freq: Two times a day (BID) | ORAL | 1 refills | Status: AC

## 2024-01-14 MED ORDER — DICLOFENAC SODIUM 1 % EX GEL: 2.0000 g | Freq: Three times a day (TID) | CUTANEOUS | 1 refills | Status: AC

## 2024-01-14 NOTE — Progress Notes (Signed)
 Established Patient Office Visit  Subjective:  Patient ID: Gregory Lane, male    DOB: Oct 01, 1950  Age: 73 y.o. MRN: [id number]  Chief Complaint  Patient presents with   Follow-up    2 week lab results and shoulder follow up    No new complaints, here for lab review and medication refills. Left shoulder pain failed to improve with diclofenac  and Celecoxib .    No other concerns at this time.   Past Medical History:  Diagnosis Date   Anxiety    Aortic atherosclerosis 02/16/2017   Arthritis    Blind left eye    COPD (chronic obstructive pulmonary disease) (HCC)    Depression    Dysrhythmia    A-fib   GERD (gastroesophageal reflux disease)    Hypertension    Low TSH level 02/17/2017   Myocardial infarction [facility])    PAD (peripheral artery disease)    Paroxysmal atrial fibrillation (HCC)    Perforated duodenal ulcer (HCC)    Stroke (HCC)    left side is weak    Past Surgical History:  Procedure Laterality Date   BRONCHIAL BIOPSY  03/22/2022   Procedure: BRONCHIAL BIOPSIES;  Surgeon: Isadora Hose, MD;  Location: [facility] ENDOSCOPY;  Service: Pulmonary;;   BRONCHIAL NEEDLE ASPIRATION BIOPSY  03/22/2022   Procedure: BRONCHIAL NEEDLE ASPIRATION BIOPSIES;  Surgeon: Isadora Hose, MD;  Location: [facility] ENDOSCOPY;  Service: Pulmonary;;   ENDOBRONCHIAL ULTRASOUND  03/22/2022   Procedure: ENDOBRONCHIAL ULTRASOUND;  Surgeon: Isadora Hose, MD;  Location: [facility] ENDOSCOPY;  Service: Pulmonary;;   EXPLORATORY LAPAROTOMY  05/12/2015   EYE SURGERY     FRACTURE SURGERY     LAPAROTOMY N/A 05/10/2015   Procedure: EXPLORATORY LAPAROTOMY;  Surgeon: Lynda Leos, MD;  Location: [facility] OR;  Service: General;  Laterality: N/A;   LEFT HEART CATH AND CORONARY ANGIOGRAPHY N/A 05/02/2016   Procedure: Left Heart Cath and Coronary Angiography;  Surgeon: Gordy Bergamo, MD;  Location: [facility] INVASIVE CV LAB;  Service: Cardiovascular;  Laterality: N/A;   LOWER EXTREMITY ANGIOGRAPHY Left 09/21/2021   Procedure: Lower  Extremity Angiography;  Surgeon: Jama Cordella KANDICE, MD;  Location: [facility] INVASIVE CV LAB;  Service: Cardiovascular;  Laterality: Left;    Social History   Socioeconomic History   Marital status: Single    Spouse name: Not on file   Number of children: Not on file   Years of education: Not on file   Highest education level: Not on file  Occupational History   Not on file  Tobacco Use   Smoking status: Every Day    Current packs/day: 0.50    Average packs/day: 0.5 packs/day for 65.0 years (32.5 ttl pk-yrs)    Types: Cigarettes   Smokeless tobacco: Never   Tobacco comments:    0.5 PPD  Vaping Use   Vaping status: Never Used  Substance and Sexual Activity   Alcohol use: Yes    Alcohol/week: 60.0 standard drinks of alcohol    Types: 60 Standard drinks or equivalent per week    Comment: daily 12 pk of beer   Drug use: No   Sexual activity: Not Currently  Other Topics Concern   Not on file  Social History Narrative   [employer] [employer]. Lives with sister and sister-in-law   Social Drivers of Health   Financial Resource Strain: Low Risk  (12/11/2023)   Received from [facility]   Overall Financial Resource Strain (CARDIA)    Difficulty of Paying Living Expenses: Not very hard  Food Insecurity:  Food Insecurity Present (12/11/2023)   Received from [facility]   Hunger Vital Sign    Within the past 12 months, you worried that your food would run out before you got the money to buy more.: Often true    Within the past 12 months, the food you bought just didn't last and you didn't have money to get more.: Sometimes true  Transportation Needs: No Transportation Needs (12/11/2023)   Received from [facility] - Transportation    In the past 12 months, has lack of transportation kept you from medical appointments or from getting medications?: No    Lack of Transportation (Non-Medical): No  Physical Activity: Inactive  (01/23/2022)   Exercise Vital Sign    Days of Exercise per Week: 0 days    Minutes of Exercise per Session: 0 min  Stress: No Stress Concern Present (09/09/2021)   [employer] - Occupational Stress Questionnaire    Feeling of Stress : Only a little  Social Connections: Socially Isolated (09/09/2021)   Social Connection and Isolation Panel    Frequency of Communication with Friends and Family: More than three times a week    Frequency of Social Gatherings with Friends and Family: More than three times a week    Attends Religious Services: Never    Database Administrator or Organizations: No    Attends Banker Meetings: Never    Marital Status: Never married  Intimate Partner Violence: Not At Risk (03/28/2022)   Humiliation, Afraid, Rape, and Kick questionnaire    Fear of Current or Ex-Partner: No    Emotionally Abused: No    Physically Abused: No    Sexually Abused: No    Family History  Problem Relation Age of Onset   Hypertension Mother    Dementia Mother    Cirrhosis Father    Cancer Father    Lung cancer Sister    Hypertension Sister    Arthritis Sister    Heart murmur Sister    Arrhythmia Sister     No Known Allergies  Outpatient Medications Prior to Visit  Medication Sig   apixaban  (ELIQUIS ) 5 MG TABS tablet Take 1 tablet (5 mg total) by mouth 2 (two) times daily.   atorvastatin  (LIPITOR) 40 MG tablet Take 1 tablet (40 mg total) by mouth daily.   FLUoxetine  (PROZAC ) 20 MG capsule Take 2 capsules (40 mg total) by mouth daily.   losartan  (COZAAR ) 50 MG tablet Take 1 tablet (50 mg total) by mouth 2 (two) times daily.   pantoprazole  (PROTONIX ) 40 MG tablet Take 1 tablet (40 mg total) by mouth 2 (two) times daily.   [DISCONTINUED] celecoxib  (CELEBREX ) 200 MG capsule TAKE 1 CAPSULE BY MOUTH EVERY DAY   [DISCONTINUED] cetirizine  (ZYRTEC ) 10 MG tablet TAKE 1 TABLET BY MOUTH EVERY DAY   [DISCONTINUED] diclofenac  Sodium (VOLTAREN ) 1 % GEL  Apply 2 g topically in the morning and at bedtime. Apply to left shoulder   fluticasone  (FLONASE ) 50 MCG/ACT nasal spray Place 1 spray into both nostrils daily. (Patient not taking: Reported on 01/14/2024)   furosemide  (LASIX ) 20 MG tablet Take 20 mg by mouth daily. (Patient not taking: Reported on 01/14/2024)   KLOR-CON  M20 20 MEQ tablet Take 20 mEq by mouth 2 (two) times daily. (Patient not taking: Reported on 01/14/2024)   No facility-administered medications prior to visit.    Review of Systems  Constitutional:  Positive for weight loss (3  lbs).  HENT: Negative.    Eyes: Negative.   Respiratory: Negative.    Cardiovascular: Negative.   Gastrointestinal: Negative.   Genitourinary: Negative.   Skin:  Negative for itching and rash.  Neurological: Negative.   Endo/Heme/Allergies: Negative.   Psychiatric/Behavioral:  The patient has insomnia.        Objective:   BP (!) 152/76   Pulse (!) 54   Temp 97.9 F (36.6 C)   Ht 6' 1 (1.854 m)   Wt 188 lb 12.8 oz (85.6 kg)   SpO2 96%   BMI 24.91 kg/m   Vitals:   01/14/24 1526  BP: (!) 152/76  Pulse: (!) 54  Temp: 97.9 F (36.6 C)  Height: 6' 1 (1.854 m)  Weight: 188 lb 12.8 oz (85.6 kg)  SpO2: 96%  BMI (Calculated): 24.91    Physical Exam Vitals reviewed.  Constitutional:      Appearance: Normal appearance.  HENT:     Head: Normocephalic.     Left Ear: There is no impacted cerumen.     Nose: Nose normal.     Mouth/Throat:     Mouth: Mucous membranes are moist.     Pharynx: No posterior oropharyngeal erythema.  Eyes:     Extraocular Movements: Extraocular movements intact.     Pupils: Pupils are equal, round, and reactive to light.  Cardiovascular:     Rate and Rhythm: Regular rhythm.     Chest Wall: PMI is not displaced.     Pulses: Normal pulses.     Heart sounds: Normal heart sounds. No murmur heard. Pulmonary:     Effort: Pulmonary effort is normal.     Breath sounds: Normal air entry. No rhonchi or  rales.  Abdominal:     General: Abdomen is flat. Bowel sounds are normal. There is no distension.     Palpations: Abdomen is soft. There is no hepatomegaly, splenomegaly or mass.     Tenderness: There is no abdominal tenderness.  Musculoskeletal:     Left shoulder: Decreased range of motion. Decreased strength.     Cervical back: Normal range of motion and neck supple.     Right lower leg: No edema.     Left lower leg: No edema.  Skin:    General: Skin is warm and dry.  Neurological:     General: No focal deficit present.     Mental Status: He is alert and oriented to person, place, and time.     Cranial Nerves: No cranial nerve deficit.     Motor: No weakness.     Comments: Expressive aphasia  Psychiatric:        Mood and Affect: Mood normal.        Behavior: Behavior normal.      No results found for any visits on 01/14/24.  Recent Results (from the past 2160 hours)  CBC With Diff/Platelet     Status: None   Collection Time: 12/31/23  2:05 PM  Result Value Ref Range   WBC 4.2 3.4 - 10.8 x10E3/uL   RBC 4.32 4.14 - 5.80 x10E6/uL   Hemoglobin 13.7 13.0 - 17.7 g/dL   Hematocrit 58.0 62.4 - 51.0 %   MCV 97 79 - 97 fL   MCH 31.7 26.6 - 33.0 pg   MCHC 32.7 31.5 - 35.7 g/dL   RDW 86.1 88.3 - 84.5 %   Platelets 198 150 - 450 x10E3/uL   Neutrophils 63 Not Estab. %   Lymphs 21 Not Estab. %  Monocytes 10 Not Estab. %   Eos 4 Not Estab. %   Basos 2 Not Estab. %   Neutrophils Absolute 2.7 1.4 - 7.0 x10E3/uL   Lymphocytes Absolute 0.9 0.7 - 3.1 x10E3/uL   Monocytes Absolute 0.4 0.1 - 0.9 x10E3/uL   EOS (ABSOLUTE) 0.2 0.0 - 0.4 x10E3/uL   Basophils Absolute 0.1 0.0 - 0.2 x10E3/uL   Immature Granulocytes 0 Not Estab. %   Immature Grans (Abs) 0.0 0.0 - 0.1 x10E3/uL  Comprehensive metabolic panel with GFR     Status: None   Collection Time: 12/31/23  2:05 PM  Result Value Ref Range   Glucose 78 70 - 99 mg/dL   BUN 15 8 - 27 mg/dL   Creatinine, Ser 9.06 0.76 - 1.27 mg/dL    eGFR 86 >40 fO/fpw/8.26   BUN/Creatinine Ratio 16 10 - 24   Sodium 137 134 - 144 mmol/L   Potassium 4.3 3.5 - 5.2 mmol/L   Chloride 101 96 - 106 mmol/L   CO2 24 20 - 29 mmol/L   Calcium  8.8 8.6 - 10.2 mg/dL   Total Protein 6.6 6.0 - 8.5 g/dL   Albumin 4.2 3.8 - 4.8 g/dL   Globulin, Total 2.4 1.5 - 4.5 g/dL   Bilirubin Total 0.6 0.0 - 1.2 mg/dL   Alkaline Phosphatase 113 47 - 123 IU/L   AST 12 0 - 40 IU/L   ALT 10 0 - 44 IU/L  Lipid panel     Status: Abnormal   Collection Time: 12/31/23  2:05 PM  Result Value Ref Range   Cholesterol, Total 178 100 - 199 mg/dL   Triglycerides 61 0 - 149 mg/dL   HDL 44 >60 mg/dL   VLDL Cholesterol Cal 12 5 - 40 mg/dL   LDL Chol Calc (NIH) 877 (H) 0 - 99 mg/dL   Chol/HDL Ratio 4.0 0.0 - 5.0 ratio    Comment:                                   T. Chol/HDL Ratio                                             Men  Women                               1/2 Avg.Risk  3.4    3.3                                   Avg.Risk  5.0    4.4                                2X Avg.Risk  9.6    7.1                                3X Avg.Risk 23.4   11.0       Assessment & Plan:  Gregory Lane was seen today for follow-up.  Arthritis of left shoulder -     Celecoxib ; Take 1 capsule (200 mg total) by  mouth 2 (two) times daily.  Dispense: 60 capsule; Refill: 1 -     Diclofenac  Sodium; Apply 2 g topically 3 (three) times daily. Apply to left shoulder  Dispense: 200 g; Refill: 1 -     Ambulatory referral to Physical Therapy  Allergic rhinitis due to pollen, unspecified seasonality -     Cetirizine  HCl; Take 1 tablet (10 mg total) by mouth daily.  Dispense: 90 tablet; Refill: 1  Cerebrovascular accident (CVA) due to embolism of cerebral artery (HCC) -     Ambulatory referral to Physical Therapy  History of stroke  Cerebrovascular disease -     Lipid panel -     Comprehensive metabolic panel with GFR -     CK; Future    Problem List Items Addressed This Visit        Cardiovascular and Mediastinum   Cerebrovascular accident (CVA) due to embolism of cerebral artery (HCC)   Relevant Orders   Ambulatory referral to Physical Therapy     Respiratory   Allergic rhinitis due to pollen   Relevant Medications   cetirizine  (ZYRTEC ) 10 MG tablet     Musculoskeletal and Integument   Arthritis of left shoulder - Primary   Relevant Medications   celecoxib  (CELEBREX ) 200 MG capsule   diclofenac  Sodium (VOLTAREN ) 1 % GEL   Other Relevant Orders   Ambulatory referral to Physical Therapy   Other Visit Diagnoses       History of stroke         Cerebrovascular disease       Relevant Orders   Lipid panel   Comprehensive metabolic panel with GFR   CK       Return in about 3 months (around 04/15/2024) for fu with labs prior.   Total time spent: 20 minutes. This time includes review of previous notes and results and patient face to face interaction during today'Lerin Jech visit.    Sherrill Cinderella Perry, MD  01/14/2024   This document may have been prepared by [facility] Voice Recognition software and as such may include unintentional dictation errors.

## 2024-01-15 ENCOUNTER — Ambulatory Visit: Admitting: Speech Pathology

## 2024-01-15 ENCOUNTER — Encounter: Admitting: Occupational Therapy

## 2024-01-15 DIAGNOSIS — R4701 Aphasia: Secondary | ICD-10-CM | POA: Diagnosis not present

## 2024-01-15 DIAGNOSIS — R482 Apraxia: Secondary | ICD-10-CM

## 2024-01-15 NOTE — Therapy (Signed)
 OUTPATIENT SPEECH LANGUAGE PATHOLOGY  SPEECH LANGUAGE TREATMENT NOTE    Patient Name: Gregory Lane MRN: [id number] DOB:May 17, 1950, 73 y.o., male 60 Date: 01/15/2024   PCP: Denyse DELENA Bathe, MD REFERRING PROVIDER: Arland Earnie Pouch, MD    End of Session - 01/15/24 1238     Visit Number 35    Number of Visits 44    Date for Recertification  02/07/24    Authorization Type [employer]    Progress Note Due on Visit 40    SLP Start Time 1152    SLP Stop Time  1230    SLP Time Calculation (min) 38 min    Activity Tolerance Patient tolerated treatment well          Past Medical History:  Diagnosis Date   Anxiety    Aortic atherosclerosis 02/16/2017   Arthritis    Blind left eye    COPD (chronic obstructive pulmonary disease) (HCC)    Depression    Dysrhythmia    A-fib   GERD (gastroesophageal reflux disease)    Hypertension    Low TSH level 02/17/2017   Myocardial infarction [facility])    PAD (peripheral artery disease)    Paroxysmal atrial fibrillation (HCC)    Perforated duodenal ulcer (HCC)    Stroke (HCC)    left side is weak   Past Surgical History:  Procedure Laterality Date   BRONCHIAL BIOPSY  03/22/2022   Procedure: BRONCHIAL BIOPSIES;  Surgeon: Isadora Hose, MD;  Location: [facility] ENDOSCOPY;  Service: Pulmonary;;   BRONCHIAL NEEDLE ASPIRATION BIOPSY  03/22/2022   Procedure: BRONCHIAL NEEDLE ASPIRATION BIOPSIES;  Surgeon: Isadora Hose, MD;  Location: [facility] ENDOS[facility];  Service: Pulmonary;;   ENDOBRONCHIAL ULTRASOUND  03/22/2022   Procedure: ENDOBRONCHIAL ULTRASOUND;  Surgeon: Isadora Hose, MD;  Location: [facility] ENDOSCOPY;  Service: Pulmonary;;   EXPLORATORY LAPAROTOMY  05/12/2015   EYE SURGERY     FRACTURE SURGERY     LAPAROTOMY N/A 05/10/2015   Procedure: EXPLORATORY LAPAROTOMY;  Surgeon: Lynda Leos, MD;  Location: [facility] OR;  Service: General;  Laterality: N/A;   LEFT HEART CATH AND CORONARY ANGIOGRAPHY N/A 05/02/2016   Procedure: Left Heart Cath  and Coronary Angiography;  Surgeon: Gordy Bergamo, MD;  Location: [facility] INVASIVE CV LAB;  Service: Cardiovascular;  Laterality: N/A;   LOWER EXTREMITY ANGIOGRAPHY Left 09/21/2021   Procedure: Lower Extremity Angiography;  Surgeon: Jama Cordella KANDICE, MD;  Location: [facility] INVASIVE CV LAB;  Service: Cardiovascular;  Laterality: Left;   Patient Active Problem List   Diagnosis Date Noted   Aphasia 12/31/2023   Arthritis of left shoulder 12/31/2023   Allergic rhinitis due to pollen 12/11/2022   Tinea cruris 12/11/2022   BPH associated with nocturia 12/11/2022   Chest pain, non-cardiac 04/11/2022   Adenocarcinoma of lower lobe of right lung (HCC) 03/28/2022   Pulmonary nodule 1 cm or greater in diameter 03/22/2022   Leg weakness, bilateral 02/09/2022   Osteopenia 10/13/2021   PAD (peripheral artery disease) 09/21/2021   GERD without esophagitis 09/21/2021   Dyslipidemia 09/21/2021   Depression 09/21/2021   Atrial flutter (HCC) 09/21/2021   Chronic obstructive pulmonary disease (COPD) (HCC) 09/21/2021   Coronary artery disease 09/21/2021   Cerebrovascular accident (CVA) due to embolism of cerebral artery (HCC) 09/06/2021   Bilateral edema of lower extremity 08/05/2020   Vasovagal syncope 08/02/2020   Annual physical exam 12/26/2019   Hyperlipidemia 12/26/2019   Abrasion of right hand 12/26/2019   Metabolic syndrome 11/11/2019   Low TSH level 02/17/2017  Atrial flutter with rapid ventricular response (HCC) 02/16/2017   Cigarette smoker 02/16/2017   COPD with acute exacerbation (HCC) 02/16/2017   Elevated lactic acid level 02/16/2017   Aortic atherosclerosis 02/16/2017   Productive cough    Hypoxia 05/24/2015   SOB (shortness of breath) 05/24/2015   COPD suggested by initial evaluation 05/17/2015   Essential hypertension 05/17/2015   Smoking 05/17/2015    ONSET DATE: 08/09/2023   REFERRING DIAG: I63.9 (ICD-10-CM) - Cerebral infarction, unspecified   THERAPY DIAG:   Aphasia  Apraxia  Rationale for Evaluation and Treatment Rehabilitation  SUBJECTIVE:   PERTINENT HISTORY and DIAGNOSTIC FINDINGS: Pt is a 73 year old right handed male who [facility] ED on 08/09/2023 with stroke-like symptoms - slurred speech and difficulty answering questions and weakness to his right side extremities. CT head and subsequent CTA head and neck shows left M2 occlusion with large penumbra in the region. Pt transferred to [facility] for mechanical thrombectomy on 08/10/2023. Pt admitted at [facility] from 08/10/2023 thru 08/21/2023 with recommended for home health.   MRI brain: Remonstrated left MCA territory infarct primarily involving the parietal lobe.   Additional medical history includes: blindness left eye, A-fib on warfarin, COPD, HFpEF, HTN, current smoker, ethanol abuse, RLL adenocarcinoma s/p SBRT (2024)    PAIN:  Are you having pain? Unable to communicate  FALLS: Has patient fallen in last 6 months?  No  LIVING ENVIRONMENT: Lives with: sister-in-law Lives in: House/apartment  PLOF:  Level of assistance: Independent with ADLs, Independent with IADLs, Comment: poor health literacy, difficulty with reading and writing per family report Employment: Retired   PATIENT GOALS    per pt's sister-in-law, she hopes he will talk again  SUBJECTIVE STATEMENT: Pt pleasant Pt accompanied by: sister-in-law  OBJECTIVE:   TODAY'S TREATMENT:  Skilled ST treatment session targeted pt's aphasia and apraxia of speech goals. SLP facilitated session by providing the following interventions:  To increase comprehension skills, pictures of thanksgiving foods (as given by his sister-in-law that she prepares for the holiday) paired with their words were presented in the following:  SLP verbally presented each picture and written word. Pt then said each word with the clinician. The last round consisted of pt saying word by self.  When verbalized with SLP: Achieved 8 out of 12 trials  independently, improved to 11 out of 12 given max A.  When verbalized by self: Achieved 3 out of 12 trials independently, improved to 5 out of 12 given max A. Pt benefited from phonemic, visual, and verbal cues.  Given index card with pt's name, pt able to independently produce last name and dogs name intelligibly. With maximum visual, phonemic and semantic cues, pt able to produce first name. Pt unable to produce sister in laws name.   Thanksgiving word scramble: Once conditioned to the task, pt able to unscramble the letters to find the words from a provided list with supervision A. Pt intermittently able to intelligibility approximate/produce the word.    PATIENT EDUCATION: Education details: see above Person educated: Patient and his sister-in-law Education method: Explanation Education comprehension: needs further education  HOME EXERCISE PROGRAM:   Supplement verbal information with single written words  GOALS:  Goals reviewed with patient? Yes  SHORT TERM GOALS: Target date: 10 sessions  Updated 12/25/2023  Updated: 11/15/2023 Updated: 10/10/2023 With maximal assistance, pt will select written word to express request for food/drink item in 7 out of 10 opportunities.  Baseline: Goal status: INITIAL: MET  2.  With maximal  multimodal assistance, pt will answer yes/no questions in 8 out of 10 opportunities.  Baseline:  Goal status: INITIAL: Is able to answer using written language when choosing between written yes/no: MET  3.  With maximal multimodal assistance, pt will verbally approximate common object names in 5 out of 10 opportunities.  Baseline:  Goal status: INITIAL: progress made: MET  4. With minimal assistance, pt will point to 1 common object by function in a field of 3 with 90% accuracy.  Baseline:  Goal status: INITIAL: progress made: With minimal assistance, pt will point to 1 common object by function in a field of 3 with 80% accuracy in 3 out of 5 sessions.      5. With minimal to moderate assistance, pt will point, in sequence to 2 common objects by function in field of 3 with 90% accuracy.   Baseline:  Goal status: INITIAL: progress made: With minimal to moderate assistance, pt will point, in sequence to 2 common objects by function in field of 3 with 80% accuracy in 3 out of 5 sessions.     6. With minimal to moderate assistance pt will verablly approximate names of basic functional objects with > 75% speech intelligiblity at the word level.    Baseline:  Goal status: INITIAL: progress made, continue targeting: With minimal to moderate assistance pt with verablly approximate names of basic functional objects with > 75% speech intelligiblity at the word level in 3 out of 5 sessions.    7. With minimal assistance, pt will identify body parts with 90% accuracy.   Baseline:  Goal status: INITIAL: MET  8. With moderate assistance, pt will follow 1 step directions with 75% accuracy. Baseline: Goal status: INITIAL: MET  9. With minimal assistance, pt will point, in sequence, to 2 common objects by name from a field of 3 with 80% accuracy across 3 consecutive sessions.  Baseline:  Goal status: INITIAL:   LONG TERM GOALS: Target date: 11/20/2023  Updated 12/25/2023  Updated: 11/15/2023 Updated: 10/10/2023 Pt will use multimodal communication and maximal assistance to communicate basic wants and needs in 2 out of 5 opportunities per pt and family report.  Baseline:  Goal status: INITIAL: progress, continue targeting  2.  With Maximal A, patient/family will demonstrate understanding of the following concepts: aphasia, spontaneous recovery, communication vs conversation, strengths/strategies to promote success, local resources by answering multiple choice questions with 50% accuracy when provided supported conversation in order to increase patient and caregiver's participation in medical care.  Baseline:  Goal status: INITIAL: progress, continue  targeting   ASSESSMENT:  CLINICAL IMPRESSION: Patient is a 73 y.o. right handed male who was seen today for a speech language treatment d/t left MCA CVA.   Pt presents with improving language impairment, now conceptualized as moderate to severe Wernicke's Aphasia (as per WAB-R, above).   Verbal Communication Pt's verbal communication continues to be fluent but increased number of islands of intelligible speech especially when commenting.   Auditory Comprehension Pt with improved auditory comprehension as evidenced by his ability to select objects in field of 4, answer basic to progressing semi-complex yes/no questions and following 1 step directions.   Written Language Pt with improved letter recognition and ability to copy written words.   Reading Comprehension Continues to be a strength for pt. Utilized to supplement receptive language deficits.   See the above treatment note for details.   OBJECTIVE IMPAIRMENTS include expressive language, receptive language, aphasia, apraxia, and written language. These impairments are limiting patient  from managing medications, managing appointments, managing finances, household responsibilities, ADLs/IADLs, and effectively communicating at home and in community. Factors affecting potential to achieve goals and functional outcome are severity of impairments, family/community support, and significantly reduced health literacy. Patient will benefit from skilled SLP services to address above impairments and improve overall function.  REHAB POTENTIAL: Good  PLAN: SLP FREQUENCY: 1-2x/week  SLP DURATION: 12 weeks  PLANNED INTERVENTIONS: Language facilitation, Cueing hierachy, Internal/external aids, Functional tasks, Multimodal communication approach, SLP instruction and feedback, Compensatory strategies, and Patient/family education  Hassell Roys, SLP Student Clinician  Happi B. Rubbie, M.S., CCC-SLP, Research Officer, [employer] Certified Brain Injury Specialist [facility]  [facility] Services Office [phone] Ascom [phone] Fax [phone]

## 2024-01-22 ENCOUNTER — Ambulatory Visit: Attending: Internal Medicine | Admitting: Speech Pathology

## 2024-01-22 ENCOUNTER — Ambulatory Visit

## 2024-01-22 ENCOUNTER — Encounter: Admitting: Occupational Therapy

## 2024-01-22 DIAGNOSIS — R4701 Aphasia: Secondary | ICD-10-CM | POA: Diagnosis present

## 2024-01-22 DIAGNOSIS — M6281 Muscle weakness (generalized): Secondary | ICD-10-CM | POA: Insufficient documentation

## 2024-01-22 DIAGNOSIS — R2681 Unsteadiness on feet: Secondary | ICD-10-CM | POA: Insufficient documentation

## 2024-01-22 DIAGNOSIS — R278 Other lack of coordination: Secondary | ICD-10-CM | POA: Insufficient documentation

## 2024-01-22 DIAGNOSIS — R41841 Cognitive communication deficit: Secondary | ICD-10-CM | POA: Insufficient documentation

## 2024-01-22 DIAGNOSIS — R482 Apraxia: Secondary | ICD-10-CM | POA: Insufficient documentation

## 2024-01-22 DIAGNOSIS — M25512 Pain in left shoulder: Secondary | ICD-10-CM

## 2024-01-22 NOTE — Therapy (Signed)
 OUTPATIENT PHYSICAL THERAPY NEURO EVALUATION   Patient Name: Gregory Lane MRN: [id number] DOB:May 11, 1950, 73 y.o., male 76 Date: 01/22/2024   PCP: Fernand Denyse LABOR, MD  REFERRING PROVIDER: Albina GORMAN Dine, MD   END OF SESSION:  PT End of Session - 01/22/24 1152     Visit Number 1    Number of Visits 25    Date for Recertification  04/15/24    PT Start Time 1152    PT Stop Time 1234    PT Time Calculation (min) 42 min          Past Medical History:  Diagnosis Date   Anxiety    Aortic atherosclerosis 02/16/2017   Arthritis    Blind left eye    COPD (chronic obstructive pulmonary disease) (HCC)    Depression    Dysrhythmia    A-fib   GERD (gastroesophageal reflux disease)    Hypertension    Low TSH level 02/17/2017   Myocardial infarction [facility])    PAD (peripheral artery disease)    Paroxysmal atrial fibrillation (HCC)    Perforated duodenal ulcer (HCC)    Stroke (HCC)    left side is weak   Past Surgical History:  Procedure Laterality Date   BRONCHIAL BIOPSY  03/22/2022   Procedure: BRONCHIAL BIOPSIES;  Surgeon: Isadora Hose, MD;  Location: [facility];  Service: Pulmonary;;   BRONCHIAL NEEDLE ASPIRATION BIOPSY  03/22/2022   Procedure: BRONCHIAL NEEDLE ASPIRATION BIOPSIES;  Surgeon: Isadora Hose, MD;  Location: [facility];  Service: Pulmonary;;   ENDOBRONCHIAL ULTRASOUND  03/22/2022   Procedure: ENDOBRONCHIAL ULTRASOUND;  Surgeon: Isadora Hose, MD;  Location: [facility];  Service: Pulmonary;;   EXPLORATORY LAPAROTOMY  05/12/2015   EYE SURGERY     FRACTURE SURGERY     LAPAROTOMY N/A 05/10/2015   Procedure: EXPLORATORY LAPAROTOMY;  Surgeon: Lynda Leos, MD;  Location: [facility] OR;  Service: General;  Laterality: N/A;   LEFT HEART CATH AND CORONARY ANGIOGRAPHY N/A 05/02/2016   Procedure: Left Heart Cath and Coronary Angiography;  Surgeon: Gordy Bergamo, MD;  Location: [facility] INVASIVE CV LAB;  Service: Cardiovascular;  Laterality: N/A;   LOWER EXTREMITY  ANGIOGRAPHY Left 09/21/2021   Procedure: Lower Extremity Angiography;  Surgeon: Jama Cordella KANDICE, MD;  Location: [facility] INVASIVE CV LAB;  Service: Cardiovascular;  Laterality: Left;   Patient Active Problem List   Diagnosis Date Noted   Aphasia 12/31/2023   Arthritis of left shoulder 12/31/2023   Allergic rhinitis due to pollen 12/11/2022   Tinea cruris 12/11/2022   BPH associated with nocturia 12/11/2022   Chest pain, non-cardiac 04/11/2022   Adenocarcinoma of lower lobe of right lung (HCC) 03/28/2022   Pulmonary nodule 1 cm or greater in diameter 03/22/2022   Leg weakness, bilateral 02/09/2022   Osteopenia 10/13/2021   PAD (peripheral artery disease) 09/21/2021   GERD without esophagitis 09/21/2021   Dyslipidemia 09/21/2021   Depression 09/21/2021   Atrial flutter (HCC) 09/21/2021   Chronic obstructive pulmonary disease (COPD) (HCC) 09/21/2021   Coronary artery disease 09/21/2021   Cerebrovascular accident (CVA) due to embolism of cerebral artery (HCC) 09/06/2021   Bilateral edema of lower extremity 08/05/2020   Vasovagal syncope 08/02/2020   Annual physical exam 12/26/2019   Hyperlipidemia 12/26/2019   Abrasion of right hand 12/26/2019   Metabolic syndrome 11/11/2019   Low TSH level 02/17/2017   Atrial flutter with rapid ventricular response (HCC) 02/16/2017   Cigarette smoker 02/16/2017   COPD with acute exacerbation (HCC) 02/16/2017   Elevated lactic acid level  02/16/2017   Aortic atherosclerosis 02/16/2017   Productive cough    Hypoxia 05/24/2015   SOB (shortness of breath) 05/24/2015   COPD suggested by initial evaluation 05/17/2015   Essential hypertension 05/17/2015   Smoking 05/17/2015    ONSET DATE: Unsure, a long time   REFERRING DIAG:  M19.012 (ICD-10-CM) - Arthritis of left shoulder  I63.40 (ICD-10-CM) - Cerebrovascular accident (CVA) due to embolism of cerebral artery (HCC)     THERAPY DIAG:  No diagnosis found.  Rationale for Evaluation and  Treatment: Rehabilitation  SUBJECTIVE:                                                                                                                                                                                             SUBJECTIVE STATEMENT: Patient's sister in-law was present to assist with subjective portion of evaluation. Patient had CVA in June effecting R side. He did not have PT treatment following CVA initially due to focusing on speech first. Patient was noted to have damage to the Wernicke's Area causing aphasia making communication slightly difficult during today's evaluation. Patient walks without AD at home, but has decreased balance when walking. His sister in-law reports the R leg gave out last week and he fell in bedroom. He has also fallen in past when performing activities on uneven surfaces such as taking out garbage. He does do a little yard work such as picking up limbs and helping out in yard, but not much.   Patient reports that he has had L shoulder pain for a long time. Cannot say if he has had any previous injuries and does not think he has had imaging of shoulder. He expressed having sharp pain with movement of L shoulder from the shoulder to the elbow.    Pt accompanied by: Sister in-law.   PERTINENT HISTORY: CVA effecting L side in June 2025, Wernicke's aphasia, R shoulder pain with movement.   PAIN:  Are you having pain? 9/10, but has difficulty describing.   PRECAUTIONS: Fall and Other: Decreased safety awareness.  RED FLAGS: None   WEIGHT BEARING RESTRICTIONS: No  FALLS: Has patient fallen in last 6 months? Yes. Number of falls Patient is unsure.   LIVING ENVIRONMENT: Lives with: lives with their family Lives in: House/apartment Stairs: Yes: External: 4 steps; on right going up and on left going up Has following equipment at home: None  PLOF: Independent  PATIENT GOALS: Improve L shoulder function/pain levels; Improve balance with gait/ADLs.    OBJECTIVE:  Note: Objective measures were completed at Evaluation unless otherwise noted.  DIAGNOSTIC FINDINGS:   COMPARISON:  CT head 09/06/2021.  FINDINGS: Brain: Encephalomalacia in the anterior right temporal and frontal lobes. Smaller area of encephalomalacia in the inferior left frontal lobe. Remote lacunar infarct in the posterior limb of the left internal capsule. No evidence of acute large vascular territory, acute hemorrhage, mass lesion or midline shift.  COGNITION: Overall cognitive status: Wernicke's Aphasia    SENSATION: Not tested  COORDINATION: Decreased speed with toe taps.     POSTURE: rounded shoulders, forward head, and increased thoracic kyphosis   LOWER EXTREMITY MMT:    MMT Right Eval Left Eval  Hip flexion 4 3+  Hip extension    Hip abduction 5 5  Hip adduction    Hip internal rotation    Hip external rotation    Knee flexion 5 4+  Knee extension 5 4  Ankle dorsiflexion 5 5  Ankle plantarflexion    Ankle inversion    Ankle eversion    (Blank rows = not tested)  UPPER EXTREMITY MMT:  MMT Right eval Left eval  Shoulder flexion 5 4-  Shoulder extension    Shoulder abduction 5 4  Shoulder adduction    Shoulder extension    Shoulder internal rotation 5 5  Shoulder external rotation 5 4  Middle trapezius    Lower trapezius    Elbow flexion    Elbow extension    Wrist flexion    Wrist extension    Wrist ulnar deviation    Wrist radial deviation    Wrist pronation    Wrist supination    Grip strength     (Blank rows = not tested)   UPPER EXTREMITY ROM:  Active ROM Right eval Left eval  Shoulder flexion 155 108  Shoulder extension    Shoulder abduction 160 90  Shoulder adduction    Shoulder extension    Shoulder internal rotation [facility]  Shoulder external rotation 57 40  Elbow flexion    Elbow extension    Wrist flexion    Wrist extension    Wrist ulnar deviation    Wrist radial deviation    Wrist pronation     Wrist supination     (Blank rows = not tested)  BED MOBILITY:  Not tested  TRANSFERS: Sit to stand: Complete Independence and CGA  Assistive device utilized: Use of one upper extremity to assist trunk lift.      RAMP:  Not tested  CURB:  Not tested  STAIRS: Not tested GAIT: Findings: Gait Characteristics: decreased arm swing- Right, decreased arm swing- Left, decreased step length- Right, decreased step length- Left, and decreased stride length, Distance walked: 50', and Comments: .  FUNCTIONAL TESTS:  5 times sit to stand: Test at next treatment session. Timed up and go (TUG): 65'' 10 meter walk test: Test at next treatment session  PATIENT SURVEYS:  Unable to perform survey due to patient's cognition.  TREATMENT DATE: 01/22/2024    PATIENT EDUCATION: Education details: Patient educated on exercises to perform at home to improve L shoulder mobility and strength with proper mechanics/positioning. Person educated: Patient and sister in-law Education method: Explanation, Demonstration, Verbal cues, and Handouts Education comprehension: returned demonstration  HOME EXERCISE PROGRAM:  Access Code: [email or web address] URL: [email or web address] Date: 01/22/2024 Prepared by: Norman Sharps  Exercises - Seated Shoulder External Rotation AAROM with Cane and Hand in Neutral  - 1 x daily - 7 x weekly - 1 sets - 15 reps - Seated Scapular Retraction  - 1 x daily - 7 x weekly - 1 sets - 15 reps - Supine Shoulder Flexion Extension AAROM with Dowel  - 1 x daily - 7 x weekly - 1 sets - 15 reps - Supine Shoulder Press with Dowel  - 1 x daily - 7 x weekly - 1 sets - 15 reps - Seated Shoulder Flexion AAROM with Dowel  - 1 x daily - 7 x weekly - 1 sets - 15 reps  GOALS: Goals reviewed with patient? Yes  SHORT TERM GOALS: Target date: 02/19/2024  Patient  will be independent with HEP allowing him to perform exercises at home with proper positioning/mechanics/safety.  Baseline: HEP initiated at evaluation with handout.  Goal status: INITIAL    LONG TERM GOALS: Target date: 04/15/2024  1.  Patient (> 51 years old) will complete five times sit to stand test in < 15 seconds indicating an increased LE strength and improved balance. Baseline: Test at 1st treatment. Goal status: INITIAL  2.  Patient will improve gait mechanics showing good arm swing, foot clearance and increased stride length allowing him to ambulate more efficiently. Baseline: Decreased UE movement, lack of bilateral foot clearance during swing phase, decreased stride length, decreased gait speed. Goal status: INITIAL   3.  Patient will reduce timed up and go to <11 seconds to reduce fall risk and demonstrate improved transfer/gait ability. Baseline: 65'' without AD. Goal status: INITIAL  4.  Patient will increase 10 meter walk test to >1.20m/s as to improve gait speed for better community ambulation and to reduce fall risk. Baseline: 65'' Goal status: INITIAL  5.  Patient will improve L shouder flexion/abduction AROM to match that of the R by discharge allowing him to reach objects at greater heights such as in kitchen cabinets.  Baseline: Flexion - 108 degrees; Abduction - 90 degrees.  Goal status: INITIAL  6.  Patient will improve L shoulder strength to 4+/5 MMT by discharge allowing him to hold and carry items in L upper extremity with decreased difficulty.  Baseline: See MMT table.  Goal status: INITIAL   ASSESSMENT:  CLINICAL IMPRESSION: Patient is a 73 y.o. male who was seen today for physical therapy evaluation and treatment for L shoulder pain and decreased balance/unsteadiness on feet following CVA. Patient initially had CVA in June of this year effecting his L side. He did not attend outpatient PT following due to focusing on stroke. Patient has Wernicke's  aphasia making communication very difficult. He reports falling within the past 6 months due to R LE giving out, but unsure how many times. He also reports having L shoulder pain that has been hurting for a very long time, but unsure how long exactly.   Patient noted to have decreased AROM in L shoulder noted with goniometric measurements. Decreased strength in L shoulder noted with manual muscle testing. Special testing attempted, but inaccurate due to communication difficulties. Examination of LE revealed decreased strength  in the lower extremities. Decreased gait speed noted during TUG with gait abnormalities. Decreased independence noted with sit to stand as patient has to upper extremities with CGA for safety. Decreased balance was also noted when ambulating without AD.   Overall, patient is a good candidate for skilled PT to improve on deficits listed above allowing him to perform ADLs/household and gait with less difficulty and reduced risk for falls in future. Patient will begin PT treatments 2x/week to improve toward his goals. He was given HEP handout along with education to begin at home following today's evaluation.    OBJECTIVE IMPAIRMENTS: Abnormal gait, decreased activity tolerance, decreased balance, decreased coordination, difficulty walking, decreased ROM, decreased strength, impaired UE functional use, and pain.   ACTIVITY LIMITATIONS: carrying, lifting, standing, squatting, stairs, and transfers  PARTICIPATION LIMITATIONS: meal prep, cleaning, laundry, interpersonal relationship, and yard work  PERSONAL FACTORS: Age, Past/current experiences, and 3+ comorbidities: Stroke, A -fib, PAD, HTN, hx. Of MI, COPD, blind in L eye. are also affecting patient's functional outcome.   REHAB POTENTIAL: Fair Due to patient's difficulty with communication.  CLINICAL DECISION MAKING: Evolving/moderate complexity  EVALUATION COMPLEXITY: Moderate  PLAN:  PT FREQUENCY: 1-2x/week  PT DURATION:  12 weeks  PLANNED INTERVENTIONS: [id number]- PT Re-evaluation, 97750- Physical Performance Testing, 97110-Therapeutic exercises, 97530- Therapeutic activity, [id number]- Neuromuscular re-education, 97535- Self Care, [id number]- Manual therapy, [id number]- Gait training, [phone]- Electrical stimulation (unattended), [phone]- Electrical stimulation (manual), Patient/Family education, Balance training, Stair training, Taping, Joint mobilization, Cryotherapy, and Moist heat  PLAN FOR NEXT SESSION:  -Perform 10 MWT and 5x sit to stand.  -Educate/give more HEP exercises for L shoulder mobility/strength and print out HEP for patient.  -Work to improve gait/balance.    Norman KATHEE Sharps, PT 01/22/2024, 1:42 PM

## 2024-01-22 NOTE — Therapy (Signed)
 OUTPATIENT SPEECH LANGUAGE PATHOLOGY  SPEECH LANGUAGE TREATMENT NOTE    Patient Name: Gregory Lane MRN: [id number] DOB:May 14, 1950, 73 y.o., male 59 Date: 01/22/2024   PCP: Denyse DELENA Bathe, MD REFERRING PROVIDER: Arland Earnie Pouch, MD    End of Session - 01/22/24 1140     Visit Number 36    Number of Visits 44    Date for Recertification  02/07/24    Authorization Type [employer] [facility] [place]    Progress Note Due on Visit 40    SLP Start Time 1100    SLP Stop Time  1140    SLP Time Calculation (min) 40 min    Activity Tolerance Patient tolerated treatment well          Past Medical History:  Diagnosis Date   Anxiety    Aortic atherosclerosis 02/16/2017   Arthritis    Blind left eye    COPD (chronic obstructive pulmonary disease) (HCC)    Depression    Dysrhythmia    A-fib   GERD (gastroesophageal reflux disease)    Hypertension    Low TSH level 02/17/2017   Myocardial infarction [facility])    PAD (peripheral artery disease)    Paroxysmal atrial fibrillation (HCC)    Perforated duodenal ulcer (HCC)    Stroke (HCC)    left side is weak   Past Surgical History:  Procedure Laterality Date   BRONCHIAL BIOPSY  03/22/2022   Procedure: BRONCHIAL BIOPSIES;  Surgeon: Isadora Hose, MD;  [facility]: [facility];  Service: Pulmonary;;   BRONCHIAL NEEDLE ASPIRATION BIOPSY  03/22/2022   Procedure: BRONCHIAL NEEDLE ASPIRATION BIOPSIES;  Surgeon: Isadora Hose, MD;  [facility]: [facility];  Service: Pulmonary;;   ENDOBRONCHIAL ULTRASOUND  03/22/2022   Procedure: ENDOBRONCHIAL ULTRASOUND;  Surgeon: Isadora Hose, MD;  Location: [facility];  Service: Pulmonary;;   EXPLORATORY LAPAROTOMY  05/12/2015   EYE SURGERY     FRACTURE SURGERY     LAPAROTOMY N/A 05/10/2015   Procedure: EXPLORATORY LAPAROTOMY;  Surgeon: Lynda Leos, MD;  Location: [facility];  Service: General;  Laterality: N/A;   LEFT HEART CATH AND CORONARY ANGIOGRAPHY N/A 05/02/2016   Procedure: Left Heart Cath and  Coronary Angiography;  Surgeon: Gordy Bergamo, MD;  [facility];  Service: Cardiovascular;  Laterality: N/A;   LOWER EXTREMITY ANGIOGRAPHY Left 09/21/2021   Procedure: Lower Extremity Angiography;  Surgeon: Jama Cordella KANDICE, MD;  Location: [facility] INVASIVE CV LAB;  Service: Cardiovascular;  Laterality: Left;   Patient Active Problem List   Diagnosis Date Noted   Aphasia 12/31/2023   Arthritis of left shoulder 12/31/2023   Allergic rhinitis due to pollen 12/11/2022   Tinea cruris 12/11/2022   BPH associated with nocturia 12/11/2022   Chest pain, non-cardiac 04/11/2022   Adenocarcinoma of lower lobe of right lung (HCC) 03/28/2022   Pulmonary nodule 1 cm or greater in diameter 03/22/2022   Leg weakness, bilateral 02/09/2022   Osteopenia 10/13/2021   PAD (peripheral artery disease) 09/21/2021   GERD without esophagitis 09/21/2021   Dyslipidemia 09/21/2021   Depression 09/21/2021   Atrial flutter (HCC) 09/21/2021   Chronic obstructive pulmonary disease (COPD) (HCC) 09/21/2021   Coronary artery disease 09/21/2021   Cerebrovascular accident (CVA) due to embolism of cerebral artery (HCC) 09/06/2021   Bilateral edema of lower extremity 08/05/2020   Vasovagal syncope 08/02/2020   Annual physical exam 12/26/2019   Hyperlipidemia 12/26/2019   Abrasion of right hand 12/26/2019   Metabolic syndrome 11/11/2019   Low TSH level 02/17/2017  Atrial flutter with rapid ventricular response (HCC) 02/16/2017   Cigarette smoker 02/16/2017   COPD with acute exacerbation (HCC) 02/16/2017   Elevated lactic acid level 02/16/2017   Aortic atherosclerosis 02/16/2017   Productive cough    Hypoxia 05/24/2015   SOB (shortness of breath) 05/24/2015   COPD suggested by initial evaluation 05/17/2015   Essential hypertension 05/17/2015   Smoking 05/17/2015    ONSET DATE: 08/09/2023   REFERRING DIAG: I63.9 (ICD-10-CM) - Cerebral infarction, unspecified   THERAPY DIAG:   Aphasia  Apraxia  Rationale for Evaluation and Treatment Rehabilitation  SUBJECTIVE:   PERTINENT HISTORY and DIAGNOSTIC FINDINGS: Pt is a 73 year old right handed male who presented to [facility] ED on 08/09/2023 with stroke-like symptoms - slurred speech and difficulty answering questions and weakness to his right side extremities. CT head and subsequent CTA head and neck shows left M2 occlusion with large penumbra in the region. Pt transferred to [facility] for mechanical thrombectomy on 08/10/2023. Pt admitted at [facility] from 08/10/2023 thru 08/21/2023 with recommended for home health.   MRI brain: Remonstrated left MCA territory infarct primarily involving the parietal lobe.   Additional medical history includes: blindness left eye, A-fib on warfarin, COPD, HFpEF, HTN, current smoker, ethanol abuse, RLL adenocarcinoma s/p SBRT (2024)    PAIN:  Are you having pain? Unable to communicate  FALLS: Has patient fallen in last 6 months?  No  LIVING ENVIRONMENT: Lives with: sister-in-law Lives in: House/apartment  PLOF:  Level of assistance: Independent with ADLs, Independent with IADLs, Comment: poor health literacy, difficulty with reading and writing per family report Employment: Retired   PATIENT GOALS    per pt's sister-in-law, she hopes he will talk again  SUBJECTIVE STATEMENT: Pt pleasant, reports good thanksgiving Pt accompanied by: sister-in-law  OBJECTIVE:   TODAY'S TREATMENT:  Skilled ST treatment session targeted pt's aphasia and apraxia of speech goals. SLP facilitated session by providing the following interventions:  To increase comprehension skills, pictures of Christmas foods (as given by his sister-in-law that she prepares for the holiday) paired with their words were presented in the following:  SLP verbally presented each picture and written word. Pt then said each word with the clinician. The last round consisted of pt saying word by self.  When verbalized with SLP:  Achieved 10 out of 12 trials independently, improved to 12 out of 12 given min to max A.  When verbalized by self: Achieved 6 out of 12 trials independently, improved to 9 out of 12 given max A. Pt benefited from phonemic, visual, and verbal cues.  Given index card with pt's name, pt able to independently produce first and last name intelligibly. With maximum visual and phonemic cues, pt able to produce sister-in-laws name and dogs name.   Christmas word scramble: Pt able to unscramble the letters to find the words from a provided list with supervision A. Pt intermittently able to intelligibility approximate/produce the word.    PATIENT EDUCATION: Education details: see above Person educated: Patient and his sister-in-law Education method: Explanation Education comprehension: needs further education  HOME EXERCISE PROGRAM:   Supplement verbal information with single written words  GOALS:  Goals reviewed with patient? Yes  SHORT TERM GOALS: Target date: 10 sessions  Updated 12/25/2023  Updated: 11/15/2023 Updated: 10/10/2023 With maximal assistance, pt will select written word to express request for food/drink item in 7 out of 10 opportunities.  Baseline: Goal status: INITIAL: MET  2.  With maximal multimodal assistance, pt will answer yes/no questions  in 8 out of 10 opportunities.  Baseline:  Goal status: INITIAL: Is able to answer using written language when choosing between written yes/no: MET  3.  With maximal multimodal assistance, pt will verbally approximate common object names in 5 out of 10 opportunities.  Baseline:  Goal status: INITIAL: progress made: MET  4. With minimal assistance, pt will point to 1 common object by function in a field of 3 with 90% accuracy.  Baseline:  Goal status: INITIAL: progress made: With minimal assistance, pt will point to 1 common object by function in a field of 3 with 80% accuracy in 3 out of 5 sessions.     5. With minimal to  moderate assistance, pt will point, in sequence to 2 common objects by function in field of 3 with 90% accuracy.   Baseline:  Goal status: INITIAL: progress made: With minimal to moderate assistance, pt will point, in sequence to 2 common objects by function in field of 3 with 80% accuracy in 3 out of 5 sessions.     6. With minimal to moderate assistance pt will verablly approximate names of basic functional objects with > 75% speech intelligiblity at the word level.    Baseline:  Goal status: INITIAL: progress made, continue targeting: With minimal to moderate assistance pt with verablly approximate names of basic functional objects with > 75% speech intelligiblity at the word level in 3 out of 5 sessions.    7. With minimal assistance, pt will identify body parts with 90% accuracy.   Baseline:  Goal status: INITIAL: MET  8. With moderate assistance, pt will follow 1 step directions with 75% accuracy. Baseline: Goal status: INITIAL: MET  9. With minimal assistance, pt will point, in sequence, to 2 common objects by name from a field of 3 with 80% accuracy across 3 consecutive sessions.  Baseline:  Goal status: INITIAL:   LONG TERM GOALS: Target date: 11/20/2023  Updated 12/25/2023  Updated: 11/15/2023 Updated: 10/10/2023 Pt will use multimodal communication and maximal assistance to communicate basic wants and needs in 2 out of 5 opportunities per pt and family report.  Baseline:  Goal status: INITIAL: progress, continue targeting  2.  With Maximal A, patient/family will demonstrate understanding of the following concepts: aphasia, spontaneous recovery, communication vs conversation, strengths/strategies to promote success, local resources by answering multiple choice questions with 50% accuracy when provided supported conversation in order to increase patient and caregiver's participation in medical care.  Baseline:  Goal status: INITIAL: progress, continue  targeting   ASSESSMENT:  CLINICAL IMPRESSION: Patient is a 73 y.o. right handed male who was seen today for a speech language treatment d/t left MCA CVA.   Pt presents with improving language impairment, now conceptualized as moderate to severe Wernicke's Aphasia (as per WAB-R, above).   Verbal Communication Pt's verbal communication continues to be fluent but increased number of islands of intelligible speech especially when commenting.   Auditory Comprehension Pt with improved auditory comprehension as evidenced by his ability to select objects in field of 4, answer basic to progressing semi-complex yes/no questions and following 1 step directions.   Written Language Pt with improved letter recognition and ability to copy written words.   Reading Comprehension Continues to be a strength for pt. Utilized to supplement receptive language deficits.   See the above treatment note for details.   OBJECTIVE IMPAIRMENTS include expressive language, receptive language, aphasia, apraxia, and written language. These impairments are limiting patient from managing medications, managing appointments, managing finances,  household responsibilities, ADLs/IADLs, and effectively communicating at home and in community. Factors affecting potential to achieve goals and functional outcome are severity of impairments, family/community support, and significantly reduced health literacy. Patient will benefit from skilled SLP services to address above impairments and improve overall function.  REHAB POTENTIAL: Good  PLAN: SLP FREQUENCY: 1-2x/week  SLP DURATION: 12 weeks  PLANNED INTERVENTIONS: Language facilitation, Cueing hierachy, Internal/external aids, Functional tasks, Multimodal communication approach, SLP instruction and feedback, Compensatory strategies, and Patient/family education  Hassell Roys, SLP Student Clinician  Happi B. Rubbie, M.S., CCC-SLP, Research Officer, [employer] Certified Brain Injury Specialist [facility]  [facility] Rehabilitation Services Office [phone] Ascom [phone] Fax [phone]

## 2024-01-24 ENCOUNTER — Ambulatory Visit: Admitting: Speech Pathology

## 2024-01-24 ENCOUNTER — Ambulatory Visit

## 2024-01-24 ENCOUNTER — Encounter: Admitting: Occupational Therapy

## 2024-01-24 DIAGNOSIS — R4701 Aphasia: Secondary | ICD-10-CM | POA: Diagnosis not present

## 2024-01-24 DIAGNOSIS — M6281 Muscle weakness (generalized): Secondary | ICD-10-CM

## 2024-01-24 DIAGNOSIS — R278 Other lack of coordination: Secondary | ICD-10-CM

## 2024-01-24 DIAGNOSIS — M25512 Pain in left shoulder: Secondary | ICD-10-CM

## 2024-01-24 DIAGNOSIS — R2681 Unsteadiness on feet: Secondary | ICD-10-CM

## 2024-01-24 NOTE — Therapy (Signed)
 OUTPATIENT SPEECH LANGUAGE PATHOLOGY  SPEECH LANGUAGE TREATMENT NOTE    Patient Name: Gregory Lane MRN: [id number] DOB:06-17-1950, 73 y.o., male 33 Date: 01/24/2024   PCP: Denyse DELENA Bathe, MD REFERRING PROVIDER: Arland Earnie Pouch, MD    End of Session - 01/24/24 1139     Visit Number 37    Number of Visits 44    Date for Recertification  02/07/24    Authorization Type [employer] [place]    Progress Note Due on Visit 40    SLP Start Time 1100    SLP Stop Time  1135    SLP Time Calculation (min) 35 min    Activity Tolerance Patient tolerated treatment well          Past Medical History:  Diagnosis Date   Anxiety    Aortic atherosclerosis 02/16/2017   Arthritis    Blind left eye    COPD (chronic obstructive pulmonary disease) (HCC)    Depression    Dysrhythmia    A-fib   GERD (gastroesophageal reflux disease)    Hypertension    Low TSH level 02/17/2017   Myocardial infarction [facility])    PAD (peripheral artery disease)    Paroxysmal atrial fibrillation (HCC)    Perforated duodenal ulcer (HCC)    Stroke (HCC)    left side is weak   Past Surgical History:  Procedure Laterality Date   BRONCHIAL BIOPSY  03/22/2022   Procedure: BRONCHIAL BIOPSIES;  Surgeon: Isadora Hose, MD;  [facility]: [facility];  Service: Pulmonary;;   BRONCHIAL NEEDLE ASPIRATION BIOPSY  03/22/2022   Procedure: BRONCHIAL NEEDLE ASPIRATION BIOPSIES;  Surgeon: Isadora Hose, MD;  Location: [facility];  Service: Pulmonary;;   ENDOBRONCHIAL ULTRASOUND  03/22/2022   Procedure: ENDOBRONCHIAL ULTRASOUND;  Surgeon: Isadora Hose, MD;  [facility]: [facility];  Service: Pulmonary;;   EXPLORATORY LAPAROTOMY  05/12/2015   EYE SURGERY     FRACTURE SURGERY     LAPAROTOMY N/A 05/10/2015   Procedure: EXPLORATORY LAPAROTOMY;  Surgeon: Lynda Leos, MD;  Location: [facility] OR;  Service: General;  Laterality: N/A;   LEFT HEART CATH AND CORONARY ANGIOGRAPHY N/A 05/02/2016   Procedure: Left Heart Cath and  Coronary Angiography;  Surgeon: Gordy Bergamo, MD;  Location: [facility] INVASIVE CV LAB;  Service: Cardiovascular;  Laterality: N/A;   LOWER EXTREMITY ANGIOGRAPHY Left 09/21/2021   Procedure: Lower Extremity Angiography;  Surgeon: Jama Cordella KANDICE, MD;  Location: [facility] INVASIVE CV LAB;  Service: Cardiovascular;  Laterality: Left;   Patient Active Problem List   Diagnosis Date Noted   Aphasia 12/31/2023   Arthritis of left shoulder 12/31/2023   Allergic rhinitis due to pollen 12/11/2022   Tinea cruris 12/11/2022   BPH associated with nocturia 12/11/2022   Chest pain, non-cardiac 04/11/2022   Adenocarcinoma of lower lobe of right lung (HCC) 03/28/2022   Pulmonary nodule 1 cm or greater in diameter 03/22/2022   Leg weakness, bilateral 02/09/2022   Osteopenia 10/13/2021   PAD (peripheral artery disease) 09/21/2021   GERD without esophagitis 09/21/2021   Dyslipidemia 09/21/2021   Depression 09/21/2021   Atrial flutter (HCC) 09/21/2021   Chronic obstructive pulmonary disease (COPD) (HCC) 09/21/2021   Coronary artery disease 09/21/2021   Cerebrovascular accident (CVA) due to embolism of cerebral artery (HCC) 09/06/2021   Bilateral edema of lower extremity 08/05/2020   Vasovagal syncope 08/02/2020   Annual physical exam 12/26/2019   Hyperlipidemia 12/26/2019   Abrasion of right hand 12/26/2019   Metabolic syndrome 11/11/2019   Low TSH level 02/17/2017  Atrial flutter with rapid ventricular response (HCC) 02/16/2017   Cigarette smoker 02/16/2017   COPD with acute exacerbation (HCC) 02/16/2017   Elevated lactic acid level 02/16/2017   Aortic atherosclerosis 02/16/2017   Productive cough    Hypoxia 05/24/2015   SOB (shortness of breath) 05/24/2015   COPD suggested by initial evaluation 05/17/2015   Essential hypertension 05/17/2015   Smoking 05/17/2015    ONSET DATE: 08/09/2023   REFERRING DIAG: I63.9 (ICD-10-CM) - Cerebral infarction, unspecified   THERAPY DIAG:  Aphasia  Rationale for  Evaluation and Treatment Rehabilitation  SUBJECTIVE:   PERTINENT HISTORY and DIAGNOSTIC FINDINGS: Pt is a 73 year old right handed male who presented to [facility] ED on 08/09/2023 with stroke-like symptoms - slurred speech and difficulty answering questions and weakness to his right side extremities. CT head and subsequent CTA head and neck shows left M2 occlusion with large penumbra in the region. Pt transferred to [facility] for mechanical thrombectomy on 08/10/2023. Pt admitted at [facility] from 08/10/2023 thru 08/21/2023 with recommended for home health.   MRI brain: Remonstrated left MCA territory infarct primarily involving the parietal lobe.   Additional medical history includes: blindness left eye, A-fib on warfarin, COPD, HFpEF, HTN, current smoker, ethanol abuse, RLL adenocarcinoma s/p SBRT (2024)    PAIN:  Are you having pain? Unable to communicate  FALLS: Has patient fallen in last 6 months?  No  LIVING ENVIRONMENT: Lives with: sister-in-law Lives in: House/apartment  PLOF:  Level of assistance: Independent with ADLs, Independent with IADLs, Comment: poor health literacy, difficulty with reading and writing per family report Employment: Retired   PATIENT GOALS    per pt's sister-in-law, she hopes he will talk again  SUBJECTIVE STATEMENT: Pt pleasant, reports he didn't feel good yesterday Pt accompanied by: sister-in-law  OBJECTIVE:   TODAY'S TREATMENT:  Skilled ST treatment session targeted pt's aphasia and apraxia of speech goals. SLP facilitated session by providing the following interventions:  Conversational spontaneous language comprehension and expression targeted today during basic language based game.   Pt with continued improved social language comprehension and intermittently improved speech intelligibility during off-the-cuff comments.   PATIENT EDUCATION: Education details: see above Person educated: Patient and his sister-in-law Education method:  Explanation Education comprehension: needs further education  HOME EXERCISE PROGRAM:   Supplement verbal information with single written words  GOALS:  Goals reviewed with patient? Yes  SHORT TERM GOALS: Target date: 10 sessions  Updated 12/25/2023  Updated: 11/15/2023 Updated: 10/10/2023 With maximal assistance, pt will select written word to express request for food/drink item in 7 out of 10 opportunities.  Baseline: Goal status: INITIAL: MET  2.  With maximal multimodal assistance, pt will answer yes/no questions in 8 out of 10 opportunities.  Baseline:  Goal status: INITIAL: Is able to answer using written language when choosing between written yes/no: MET  3.  With maximal multimodal assistance, pt will verbally approximate common object names in 5 out of 10 opportunities.  Baseline:  Goal status: INITIAL: progress made: MET  4. With minimal assistance, pt will point to 1 common object by function in a field of 3 with 90% accuracy.  Baseline:  Goal status: INITIAL: progress made: With minimal assistance, pt will point to 1 common object by function in a field of 3 with 80% accuracy in 3 out of 5 sessions.     5. With minimal to moderate assistance, pt will point, in sequence to 2 common objects by function in field of 3 with 90% accuracy.  Baseline:  Goal status: INITIAL: progress made: With minimal to moderate assistance, pt will point, in sequence to 2 common objects by function in field of 3 with 80% accuracy in 3 out of 5 sessions.     6. With minimal to moderate assistance pt will verablly approximate names of basic functional objects with > 75% speech intelligiblity at the word level.    Baseline:  Goal status: INITIAL: progress made, continue targeting: With minimal to moderate assistance pt with verablly approximate names of basic functional objects with > 75% speech intelligiblity at the word level in 3 out of 5 sessions.    7. With minimal assistance, pt will  identify body parts with 90% accuracy.   Baseline:  Goal status: INITIAL: MET  8. With moderate assistance, pt will follow 1 step directions with 75% accuracy. Baseline: Goal status: INITIAL: MET  9. With minimal assistance, pt will point, in sequence, to 2 common objects by name from a field of 3 with 80% accuracy across 3 consecutive sessions.  Baseline:  Goal status: INITIAL:   LONG TERM GOALS: Target date: 11/20/2023  Updated 12/25/2023  Updated: 11/15/2023 Updated: 10/10/2023 Pt will use multimodal communication and maximal assistance to communicate basic wants and needs in 2 out of 5 opportunities per pt and family report.  Baseline:  Goal status: INITIAL: progress, continue targeting  2.  With Maximal A, patient/family will demonstrate understanding of the following concepts: aphasia, spontaneous recovery, communication vs conversation, strengths/strategies to promote success, local resources by answering multiple choice questions with 50% accuracy when provided supported conversation in order to increase patient and caregiver's participation in medical care.  Baseline:  Goal status: INITIAL: progress, continue targeting   ASSESSMENT:  CLINICAL IMPRESSION: Patient is a 73 y.o. right handed male who was seen today for a speech language treatment d/t left MCA CVA.   Pt presents with improving language impairment, now conceptualized as moderate to severe Wernicke's Aphasia (as per WAB-R, above).   Verbal Communication Pt's verbal communication continues to be fluent but increased number of islands of intelligible speech especially when commenting.   Auditory Comprehension Pt with improved auditory comprehension as evidenced by his ability to select objects in field of 4, answer basic to progressing semi-complex yes/no questions and following 1 step directions.   Written Language Pt with improved letter recognition and ability to copy written words.   Reading  Comprehension Continues to be a strength for pt. Utilized to supplement receptive language deficits.   See the above treatment note for details.   OBJECTIVE IMPAIRMENTS include expressive language, receptive language, aphasia, apraxia, and written language. These impairments are limiting patient from managing medications, managing appointments, managing finances, household responsibilities, ADLs/IADLs, and effectively communicating at home and in community. Factors affecting potential to achieve goals and functional outcome are severity of impairments, family/community support, and significantly reduced health literacy. Patient will benefit from skilled SLP services to address above impairments and improve overall function.  REHAB POTENTIAL: Good  PLAN: SLP FREQUENCY: 1-2x/week  SLP DURATION: 12 weeks  PLANNED INTERVENTIONS: Language facilitation, Cueing hierachy, Internal/external aids, Functional tasks, Multimodal communication approach, SLP instruction and feedback, Compensatory strategies, and Patient/family education    Blakeley Margraf B. Rubbie, M.S., CCC-SLP, Tree Surgeon Certified Brain Injury Specialist [facility]  [facility] Rehabilitation Services Office [phone] Ascom [phone] Fax [phone]

## 2024-01-24 NOTE — Therapy (Signed)
 OUTPATIENT PHYSICAL THERAPY NEURO EVALUATION   Patient Name: Gregory Lane MRN: [id number] DOB:09-Aug-1950, 73 y.o., male 58 Date: 01/24/2024   PCP: Fernand Denyse LABOR, MD  REFERRING PROVIDER: Albina GORMAN Dine, MD   END OF SESSION:  PT End of Session - 01/24/24 1015     Visit Number 2    Number of Visits 25    Date for Recertification  04/15/24    PT Start Time 1015    PT Stop Time 1056    PT Time Calculation (min) 41 min          Past Medical History:  Diagnosis Date   Anxiety    Aortic atherosclerosis 02/16/2017   Arthritis    Blind left eye    COPD (chronic obstructive pulmonary disease) (HCC)    Depression    Dysrhythmia    A-fib   GERD (gastroesophageal reflux disease)    Hypertension    Low TSH level 02/17/2017   Myocardial infarction [facility])    PAD (peripheral artery disease)    Paroxysmal atrial fibrillation (HCC)    Perforated duodenal ulcer (HCC)    Stroke (HCC)    left side is weak   Past Surgical History:  Procedure Laterality Date   BRONCHIAL BIOPSY  03/22/2022   Procedure: BRONCHIAL BIOPSIES;  Surgeon: Isadora Hose, MD;  Location: [facility] ENDOSCOPY;  Service: Pulmonary;;   BRONCHIAL NEEDLE ASPIRATION BIOPSY  03/22/2022   Procedure: BRONCHIAL NEEDLE ASPIRATION BIOPSIES;  Surgeon: Isadora Hose, MD;  Location: [facility] ENDOSCOPY;  Service: Pulmonary;;   ENDOBRONCHIAL ULTRASOUND  03/22/2022   Procedure: ENDOBRONCHIAL ULTRASOUND;  Surgeon: Isadora Hose, MD;  Location: [facility] ENDOSCOPY;  Service: Pulmonary;;   EXPLORATORY LAPAROTOMY  05/12/2015   EYE SURGERY     FRACTURE SURGERY     LAPAROTOMY N/A 05/10/2015   Procedure: EXPLORATORY LAPAROTOMY;  Surgeon: Lynda Leos, MD;  Location: [facility] OR;  Service: General;  Laterality: N/A;   LEFT HEART CATH AND CORONARY ANGIOGRAPHY N/A 05/02/2016   Procedure: Left Heart Cath and Coronary Angiography;  Surgeon: Gordy Bergamo, MD;  Location: [facility] INVASIVE CV LAB;  Service: Cardiovascular;  Laterality: N/A;   LOWER EXTREMITY  ANGIOGRAPHY Left 09/21/2021   Procedure: Lower Extremity Angiography;  Surgeon: Jama Cordella KANDICE, MD;  Location: [facility] INVASIVE CV LAB;  Service: Cardiovascular;  Laterality: Left;   Patient Active Problem List   Diagnosis Date Noted   Aphasia 12/31/2023   Arthritis of left shoulder 12/31/2023   Allergic rhinitis due to pollen 12/11/2022   Tinea cruris 12/11/2022   BPH associated with nocturia 12/11/2022   Chest pain, non-cardiac 04/11/2022   Adenocarcinoma of lower lobe of right lung (HCC) 03/28/2022   Pulmonary nodule 1 cm or greater in diameter 03/22/2022   Leg weakness, bilateral 02/09/2022   Osteopenia 10/13/2021   PAD (peripheral artery disease) 09/21/2021   GERD without esophagitis 09/21/2021   Dyslipidemia 09/21/2021   Depression 09/21/2021   Atrial flutter (HCC) 09/21/2021   Chronic obstructive pulmonary disease (COPD) (HCC) 09/21/2021   Coronary artery disease 09/21/2021   Cerebrovascular accident (CVA) due to embolism of cerebral artery (HCC) 09/06/2021   Bilateral edema of lower extremity 08/05/2020   Vasovagal syncope 08/02/2020   Annual physical exam 12/26/2019   Hyperlipidemia 12/26/2019   Abrasion of right hand 12/26/2019   Metabolic syndrome 11/11/2019   Low TSH level 02/17/2017   Atrial flutter with rapid ventricular response (HCC) 02/16/2017   Cigarette smoker 02/16/2017   COPD with acute exacerbation (HCC) 02/16/2017   Elevated lactic acid level  02/16/2017   Aortic atherosclerosis 02/16/2017   Productive cough    Hypoxia 05/24/2015   SOB (shortness of breath) 05/24/2015   COPD suggested by initial evaluation 05/17/2015   Essential hypertension 05/17/2015   Smoking 05/17/2015    ONSET DATE: Unsure, a long time   REFERRING DIAG:  M19.012 (ICD-10-CM) - Arthritis of left shoulder  I63.40 (ICD-10-CM) - Cerebrovascular accident (CVA) due to embolism of cerebral artery (HCC)     THERAPY DIAG:  Left shoulder pain, unspecified chronicity  Unsteadiness  on feet  Other lack of coordination  Muscle weakness (generalized)  Rationale for Evaluation and Treatment: Rehabilitation  SUBJECTIVE:                                                                                                                                                                                             SUBJECTIVE STATEMENT:  01/24/2024: Patient arrived with sister in-law. He reports doing okay and has been performing his shoulder exercises at home consistently.     At Eval: Patient's sister in-law was present to assist with subjective portion of evaluation. Patient had CVA in June effecting R side. He did not have PT treatment following CVA initially due to focusing on speech first. Patient was noted to have damage to the Wernicke's Area causing aphasia making communication slightly difficult during today's evaluation. Patient walks without AD at home, but has decreased balance when walking. His sister in-law reports the R leg gave out last week and he fell in bedroom. He has also fallen in past when performing activities on uneven surfaces such as taking out garbage. He does do a little yard work such as picking up limbs and helping out in yard, but not much.   Patient reports that he has had L shoulder pain for a long time. Cannot say if he has had any previous injuries and does not think he has had imaging of shoulder. He expressed having sharp pain with movement of L shoulder from the shoulder to the elbow.    Pt accompanied by: Sister in-law.   PERTINENT HISTORY: CVA effecting L side in June 2025, Wernicke's aphasia, R shoulder pain with movement.   PAIN:  Are you having pain? 9/10, but has difficulty describing.   PRECAUTIONS: Fall and Other: Decreased safety awareness.  RED FLAGS: None   WEIGHT BEARING RESTRICTIONS: No  FALLS: Has patient fallen in last 6 months? Yes. Number of falls Patient is unsure.   LIVING ENVIRONMENT: Lives with: lives with their  family Lives in: House/apartment Stairs: Yes: External: 4 steps; on right going up and on left going up Has following equipment  at home: None  PLOF: Independent  PATIENT GOALS: Improve L shoulder function/pain levels; Improve balance with gait/ADLs.   OBJECTIVE:  Note: Objective measures were completed at Evaluation unless otherwise noted.  DIAGNOSTIC FINDINGS:   COMPARISON:  CT head 09/06/2021.   FINDINGS: Brain: Encephalomalacia in the anterior right temporal and frontal lobes. Smaller area of encephalomalacia in the inferior left frontal lobe. Remote lacunar infarct in the posterior limb of the left internal capsule. No evidence of acute large vascular territory, acute hemorrhage, mass lesion or midline shift.  COGNITION: Overall cognitive status: Wernicke's Aphasia    SENSATION: Not tested  COORDINATION: Decreased speed with toe taps.     POSTURE: rounded shoulders, forward head, and increased thoracic kyphosis   LOWER EXTREMITY MMT:    MMT Right Eval Left Eval  Hip flexion 4 3+  Hip extension    Hip abduction 5 5  Hip adduction    Hip internal rotation    Hip external rotation    Knee flexion 5 4+  Knee extension 5 4  Ankle dorsiflexion 5 5  Ankle plantarflexion    Ankle inversion    Ankle eversion    (Blank rows = not tested)  UPPER EXTREMITY MMT:  MMT Right eval Left eval  Shoulder flexion 5 4-  Shoulder extension    Shoulder abduction 5 4  Shoulder adduction    Shoulder extension    Shoulder internal rotation 5 5  Shoulder external rotation 5 4  Middle trapezius    Lower trapezius    Elbow flexion    Elbow extension    Wrist flexion    Wrist extension    Wrist ulnar deviation    Wrist radial deviation    Wrist pronation    Wrist supination    Grip strength     (Blank rows = not tested)   UPPER EXTREMITY ROM:  Active ROM Right eval Left eval  Shoulder flexion 155 108  Shoulder extension    Shoulder abduction 160 90   Shoulder adduction    Shoulder extension    Shoulder internal rotation [facility]  Shoulder external rotation 57 40  Elbow flexion    Elbow extension    Wrist flexion    Wrist extension    Wrist ulnar deviation    Wrist radial deviation    Wrist pronation    Wrist supination     (Blank rows = not tested)  BED MOBILITY:  Not tested  TRANSFERS: Sit to stand: Complete Independence and CGA  Assistive device utilized: Use of one upper extremity to assist trunk lift.      RAMP:  Not tested  CURB:  Not tested  STAIRS: Not tested GAIT: Findings: Gait Characteristics: decreased arm swing- Right, decreased arm swing- Left, decreased step length- Right, decreased step length- Left, and decreased stride length, Distance walked: 50', and Comments: .  FUNCTIONAL TESTS:  5 times sit to stand: 27.7'' Timed up and go (TUG): 65'' 10 meter walk test: 45''   PATIENT SURVEYS:  Unable to perform survey due to patient's cognition.  TREATMENT DATE: 01/22/2024    -10 MWT: 45''  -5x sit to stand test: 27.7'' -Sit to stand x10 with bilat. UE to assist trunk lift from chair, 2nd set - x10 with ball in L hand to facilitate only single UE for lift assist from chair.     -Seated shoulder AAROM wand flexion: 2x10 -Seated L shoulder AAROM wand ER: 2x10 -Standing wall slides without UE support at balance station for safety. 10x flexion and 10x scaption.  -Wall push-ups: x15 for improved shoulder stability.  -Seated scap squeeze with bilat. ER using YTB 2x10    PATIENT EDUCATION: Education details: Patient educated on exercises to perform at home to improve L shoulder mobility and strength with proper mechanics/positioning. Person educated: Patient and sister in-law Education method: Explanation, Demonstration, Verbal cues, and Handouts Education comprehension: returned  demonstration  HOME EXERCISE PROGRAM:  Access Code: [email or web address] URL: [email or web address] Date: 01/22/2024 Prepared by: Norman Sharps  Exercises - Seated Shoulder External Rotation AAROM with Cane and Hand in Neutral  - 1 x daily - 7 x weekly - 1 sets - 15 reps - Seated Scapular Retraction  - 1 x daily - 7 x weekly - 1 sets - 15 reps - Supine Shoulder Flexion Extension AAROM with Dowel  - 1 x daily - 7 x weekly - 1 sets - 15 reps - Supine Shoulder Press with Dowel  - 1 x daily - 7 x weekly - 1 sets - 15 reps - Seated Shoulder Flexion AAROM with Dowel  - 1 x daily - 7 x weekly - 1 sets - 15 reps  GOALS: Goals reviewed with patient? Yes  SHORT TERM GOALS: Target date: 02/19/2024  Patient will be independent with HEP allowing him to perform exercises at home with proper positioning/mechanics/safety.  Baseline: HEP initiated at evaluation with handout.  Goal status: INITIAL    LONG TERM GOALS: Target date: 04/15/2024  1.  Patient (> 33 years old) will complete five times sit to stand test in < 15 seconds indicating an increased LE strength and improved balance. Baseline: Test at 1st treatment. Goal status: INITIAL  2.  Patient will improve gait mechanics showing good arm swing, foot clearance and increased stride length allowing him to ambulate more efficiently. Baseline: Decreased UE movement, lack of bilateral foot clearance during swing phase, decreased stride length, decreased gait speed. Goal status: INITIAL   3.  Patient will reduce timed up and go to <11 seconds to reduce fall risk and demonstrate improved transfer/gait ability. Baseline: 65'' without AD. Goal status: INITIAL  4.  Patient will increase 10 meter walk test to >1.52m/s as to improve gait speed for better community ambulation and to reduce fall risk. Baseline: 65'' Goal status: INITIAL  5.  Patient will improve L shouder flexion/abduction AROM to match that of the R by discharge allowing him to  reach objects at greater heights such as in kitchen cabinets.  Baseline: Flexion - 108 degrees; Abduction - 90 degrees.  Goal status: INITIAL  6.  Patient will improve L shoulder strength to 4+/5 MMT by discharge allowing him to hold and carry items in L upper extremity with decreased difficulty.  Baseline: See MMT table.  Goal status: INITIAL   ASSESSMENT:  CLINICAL IMPRESSION: Patient began session with 10 MWT and 5x STS test for continued assessment. Decreased gait speed noted during 10 MWT. He required Verb cues for increased stride length and increased arm swing while walking from chair to and from testing areas on other side  of gym with CGA for safety. Sit to stands performed to improve ability to transfer at home. Patient has R trunk lean, weight shifts to R, and uses bilateral UE for trunk assist. Pt held ball in L hand to promote single UE use for sit to stand activity for increased challenge. He was also given verb cues to shift weight to L upon standing for improved weight bearing through L, improved balance, and improved transfer efficiency.    L shoulder AAROM performed using wand for improved strength and mobility of L shoulder. Standing wall slides were performed to improve L shoulder mobility. Wall push-ups also performed to improve L shoulder stability. Patient required increased cues today due to aphasia and responded well to cuing for proper positioning/mechanics. L leg continues to be weaker than R noted throughout treatment session.   OBJECTIVE IMPAIRMENTS: Abnormal gait, decreased activity tolerance, decreased balance, decreased coordination, difficulty walking, decreased ROM, decreased strength, impaired UE functional use, and pain.   ACTIVITY LIMITATIONS: carrying, lifting, standing, squatting, stairs, and transfers  PARTICIPATION LIMITATIONS: meal prep, cleaning, laundry, interpersonal relationship, and yard work  PERSONAL FACTORS: Age, Past/current experiences, and 3+  comorbidities: Stroke, A -fib, PAD, HTN, hx. Of MI, COPD, blind in L eye. are also affecting patient's functional outcome.   REHAB POTENTIAL: Fair Due to patient's difficulty with communication.  CLINICAL DECISION MAKING: Evolving/moderate complexity  EVALUATION COMPLEXITY: Moderate  PLAN:  PT FREQUENCY: 1-2x/week  PT DURATION: 12 weeks  PLANNED INTERVENTIONS: [id number]- PT Re-evaluation, 97750- Physical Performance Testing, 97110-Therapeutic exercises, 97530- Therapeutic activity, [id number]- Neuromuscular re-education, 97535- Self Care, [id number]- Manual therapy, [id number]- Gait training, [phone]- Electrical stimulation (unattended), [phone]- Electrical stimulation (manual), Patient/Family education, Balance training, Stair training, Taping, Joint mobilization, Cryotherapy, and Moist heat  PLAN FOR NEXT SESSION:  -Continue working to improve functional movement, gait, transfers, and L shoulder strength/mobility.   Norman KATHEE Sharps, PT 01/24/2024, 12:23 PM

## 2024-01-25 ENCOUNTER — Other Ambulatory Visit: Payer: Self-pay | Admitting: Cardiovascular Disease

## 2024-01-25 DIAGNOSIS — K219 Gastro-esophageal reflux disease without esophagitis: Secondary | ICD-10-CM

## 2024-01-26 ENCOUNTER — Other Ambulatory Visit: Payer: Self-pay | Admitting: Internal Medicine

## 2024-01-26 DIAGNOSIS — J301 Allergic rhinitis due to pollen: Secondary | ICD-10-CM

## 2024-01-29 ENCOUNTER — Ambulatory Visit

## 2024-01-29 ENCOUNTER — Ambulatory Visit: Admitting: Speech Pathology

## 2024-01-29 ENCOUNTER — Encounter: Admitting: Occupational Therapy

## 2024-01-29 DIAGNOSIS — R41841 Cognitive communication deficit: Secondary | ICD-10-CM

## 2024-01-29 DIAGNOSIS — R278 Other lack of coordination: Secondary | ICD-10-CM

## 2024-01-29 DIAGNOSIS — R2681 Unsteadiness on feet: Secondary | ICD-10-CM

## 2024-01-29 DIAGNOSIS — M25512 Pain in left shoulder: Secondary | ICD-10-CM

## 2024-01-29 DIAGNOSIS — M6281 Muscle weakness (generalized): Secondary | ICD-10-CM

## 2024-01-29 DIAGNOSIS — R4701 Aphasia: Secondary | ICD-10-CM | POA: Diagnosis not present

## 2024-01-29 NOTE — Therapy (Unsigned)
 OUTPATIENT SPEECH LANGUAGE PATHOLOGY  SPEECH LANGUAGE TREATMENT NOTE    Patient Name: ZENO HICKEL MRN: [id number] DOB:02-13-1951, 73 y.o., male 64 Date: 01/29/2024   PCP: Denyse DELENA Bathe, MD REFERRING PROVIDER: Arland Earnie Pouch, MD    End of Session - 01/29/24 1407     Visit Number 38    Number of Visits 44    Date for Recertification  02/07/24    Authorization Type [employer] [place]    Progress Note Due on Visit 40    SLP Start Time 1315    SLP Stop Time  1355    SLP Time Calculation (min) 40 min    Activity Tolerance Patient tolerated treatment well          Past Medical History:  Diagnosis Date   Anxiety    Aortic atherosclerosis 02/16/2017   Arthritis    Blind left eye    COPD (chronic obstructive pulmonary disease) (HCC)    Depression    Dysrhythmia    A-fib   GERD (gastroesophageal reflux disease)    Hypertension    Low TSH level 02/17/2017   Myocardial infarction [facility])    PAD (peripheral artery disease)    Paroxysmal atrial fibrillation (HCC)    Perforated duodenal ulcer (HCC)    Stroke (HCC)    left side is weak   Past Surgical History:  Procedure Laterality Date   BRONCHIAL BIOPSY  03/22/2022   Procedure: BRONCHIAL BIOPSIES;  Surgeon: Isadora Hose, MD;  Location: [facility] ENDOSCOPY;  Service: Pulmonary;;   BRONCHIAL NEEDLE ASPIRATION BIOPSY  03/22/2022   Procedure: BRONCHIAL NEEDLE ASPIRATION BIOPSIES;  Surgeon: Isadora Hose, MD;  Location: [facility] ENDOSCOPY;  Service: Pulmonary;;   ENDOBRONCHIAL ULTRASOUND  03/22/2022   Procedure: ENDOBRONCHIAL ULTRASOUND;  Surgeon: Isadora Hose, MD;  Location: [facility] ENDOSCOPY;  Service: Pulmonary;;   EXPLORATORY LAPAROTOMY  05/12/2015   EYE SURGERY     FRACTURE SURGERY     LAPAROTOMY N/A 05/10/2015   Procedure: EXPLORATORY LAPAROTOMY;  Surgeon: Lynda Leos, MD;  Location: [facility] OR;  Service: General;  Laterality: N/A;   LEFT HEART CATH AND CORONARY ANGIOGRAPHY N/A 05/02/2016   Procedure: Left Heart Cath and  Coronary Angiography;  Surgeon: Gordy Bergamo, MD;  Location: [facility] INVASIVE CV LAB;  Service: Cardiovascular;  Laterality: N/A;   LOWER EXTREMITY ANGIOGRAPHY Left 09/21/2021   Procedure: Lower Extremity Angiography;  Surgeon: Jama Cordella KANDICE, MD;  Location: [facility] INVASIVE CV LAB;  Service: Cardiovascular;  Laterality: Left;   Patient Active Problem List   Diagnosis Date Noted   Aphasia 12/31/2023   Arthritis of left shoulder 12/31/2023   Allergic rhinitis due to pollen 12/11/2022   Tinea cruris 12/11/2022   BPH associated with nocturia 12/11/2022   Chest pain, non-cardiac 04/11/2022   Adenocarcinoma of lower lobe of right lung (HCC) 03/28/2022   Pulmonary nodule 1 cm or greater in diameter 03/22/2022   Leg weakness, bilateral 02/09/2022   Osteopenia 10/13/2021   PAD (peripheral artery disease) 09/21/2021   GERD without esophagitis 09/21/2021   Dyslipidemia 09/21/2021   Depression 09/21/2021   Atrial flutter (HCC) 09/21/2021   Chronic obstructive pulmonary disease (COPD) (HCC) 09/21/2021   Coronary artery disease 09/21/2021   Cerebrovascular accident (CVA) due to embolism of cerebral artery (HCC) 09/06/2021   Bilateral edema of lower extremity 08/05/2020   Vasovagal syncope 08/02/2020   Annual physical exam 12/26/2019   Hyperlipidemia 12/26/2019   Abrasion of right hand 12/26/2019   Metabolic syndrome 11/11/2019   Low TSH level 02/17/2017  Atrial flutter with rapid ventricular response (HCC) 02/16/2017   Cigarette smoker 02/16/2017   COPD with acute exacerbation (HCC) 02/16/2017   Elevated lactic acid level 02/16/2017   Aortic atherosclerosis 02/16/2017   Productive cough    Hypoxia 05/24/2015   SOB (shortness of breath) 05/24/2015   COPD suggested by initial evaluation 05/17/2015   Essential hypertension 05/17/2015   Smoking 05/17/2015    ONSET DATE: 08/09/2023   REFERRING DIAG: I63.9 (ICD-10-CM) - Cerebral infarction, unspecified   THERAPY DIAG:  Aphasia  Rationale for  Evaluation and Treatment Rehabilitation  SUBJECTIVE:   PERTINENT HISTORY and DIAGNOSTIC FINDINGS: Pt is a 73 year old right handed male who presented to [facility] ED on 08/09/2023 with stroke-like symptoms - slurred speech and difficulty answering questions and weakness to his right side extremities. CT head and subsequent CTA head and neck shows left M2 occlusion with large penumbra in the region. Pt transferred to [facility] for mechanical thrombectomy on 08/10/2023. Pt admitted at [facility] from 08/10/2023 thru 08/21/2023 with recommended for home health.   MRI brain: Remonstrated left MCA territory infarct primarily involving the parietal lobe.   Additional medical history includes: blindness left eye, A-fib on warfarin, COPD, HFpEF, HTN, current smoker, ethanol abuse, RLL adenocarcinoma s/p SBRT (2024)    PAIN:  Are you having pain? Unable to communicate  FALLS: Has patient fallen in last 6 months?  No  LIVING ENVIRONMENT: Lives with: sister-in-law Lives in: House/apartment  PLOF:  Level of assistance: Independent with ADLs, Independent with IADLs, Comment: poor health literacy, difficulty with reading and writing per family report Employment: Retired   PATIENT GOALS    per pt's sister-in-law, she hopes he will talk again  SUBJECTIVE STATEMENT: Pt pleasant, reports he didn't feel good yesterday Pt accompanied by: sister-in-law  OBJECTIVE:   TODAY'S TREATMENT:  Skilled ST treatment session targeted pt's aphasia and apraxia of speech goals. SLP facilitated session by providing the following interventions:  Conversational spontaneous language comprehension and expression targeted today during basic language based game.   Pt with continued improved social language comprehension and intermittently improved speech intelligibility during off-the-cuff comments.   PATIENT EDUCATION: Education details: see above Person educated: Patient and his sister-in-law Education method:  Explanation Education comprehension: needs further education  HOME EXERCISE PROGRAM:   Supplement verbal information with single written words  GOALS:  Goals reviewed with patient? Yes  SHORT TERM GOALS: Target date: 10 sessions  Updated 12/25/2023  Updated: 11/15/2023 Updated: 10/10/2023 With maximal assistance, pt will select written word to express request for food/drink item in 7 out of 10 opportunities.  Baseline: Goal status: INITIAL: MET  2.  With maximal multimodal assistance, pt will answer yes/no questions in 8 out of 10 opportunities.  Baseline:  Goal status: INITIAL: Is able to answer using written language when choosing between written yes/no: MET  3.  With maximal multimodal assistance, pt will verbally approximate common object names in 5 out of 10 opportunities.  Baseline:  Goal status: INITIAL: progress made: MET  4. With minimal assistance, pt will point to 1 common object by function in a field of 3 with 90% accuracy.  Baseline:  Goal status: INITIAL: progress made: With minimal assistance, pt will point to 1 common object by function in a field of 3 with 80% accuracy in 3 out of 5 sessions.     5. With minimal to moderate assistance, pt will point, in sequence to 2 common objects by function in field of 3 with 90% accuracy.  Baseline:  Goal status: INITIAL: progress made: With minimal to moderate assistance, pt will point, in sequence to 2 common objects by function in field of 3 with 80% accuracy in 3 out of 5 sessions.     6. With minimal to moderate assistance pt will verablly approximate names of basic functional objects with > 75% speech intelligiblity at the word level.    Baseline:  Goal status: INITIAL: progress made, continue targeting: With minimal to moderate assistance pt with verablly approximate names of basic functional objects with > 75% speech intelligiblity at the word level in 3 out of 5 sessions.    7. With minimal assistance, pt will  identify body parts with 90% accuracy.   Baseline:  Goal status: INITIAL: MET  8. With moderate assistance, pt will follow 1 step directions with 75% accuracy. Baseline: Goal status: INITIAL: MET  9. With minimal assistance, pt will point, in sequence, to 2 common objects by name from a field of 3 with 80% accuracy across 3 consecutive sessions.  Baseline:  Goal status: INITIAL:   LONG TERM GOALS: Target date: 11/20/2023  Updated 12/25/2023  Updated: 11/15/2023 Updated: 10/10/2023 Pt will use multimodal communication and maximal assistance to communicate basic wants and needs in 2 out of 5 opportunities per pt and family report.  Baseline:  Goal status: INITIAL: progress, continue targeting  2.  With Maximal A, patient/family will demonstrate understanding of the following concepts: aphasia, spontaneous recovery, communication vs conversation, strengths/strategies to promote success, local resources by answering multiple choice questions with 50% accuracy when provided supported conversation in order to increase patient and caregiver's participation in medical care.  Baseline:  Goal status: INITIAL: progress, continue targeting   ASSESSMENT:  CLINICAL IMPRESSION: Patient is a 73 y.o. right handed male who was seen today for a speech language treatment d/t left MCA CVA.   Pt presents with improving language impairment, now conceptualized as moderate to severe Wernicke's Aphasia (as per WAB-R, above).   Verbal Communication Pt's verbal communication continues to be fluent but increased number of islands of intelligible speech especially when commenting.   Auditory Comprehension Pt with improved auditory comprehension as evidenced by his ability to select objects in field of 4, answer basic to progressing semi-complex yes/no questions and following 1 step directions.   Written Language Pt with improved letter recognition and ability to copy written words.   Reading  Comprehension Continues to be a strength for pt. Utilized to supplement receptive language deficits.   See the above treatment note for details.   OBJECTIVE IMPAIRMENTS include expressive language, receptive language, aphasia, apraxia, and written language. These impairments are limiting patient from managing medications, managing appointments, managing finances, household responsibilities, ADLs/IADLs, and effectively communicating at home and in community. Factors affecting potential to achieve goals and functional outcome are severity of impairments, family/community support, and significantly reduced health literacy. Patient will benefit from skilled SLP services to address above impairments and improve overall function.  REHAB POTENTIAL: Good  PLAN: SLP FREQUENCY: 1-2x/week  SLP DURATION: 12 weeks  PLANNED INTERVENTIONS: Language facilitation, Cueing hierachy, Internal/external aids, Functional tasks, Multimodal communication approach, SLP instruction and feedback, Compensatory strategies, and Patient/family education    Budd Freiermuth B. Rubbie, M.S., CCC-SLP, Tree Surgeon Certified Brain Injury Specialist [facility]  [facility] Office [phone] Ascom [phone] Fax [phone]

## 2024-01-29 NOTE — Therapy (Signed)
 OUTPATIENT PHYSICAL THERAPY NEURO EVALUATION   Patient Name: Gregory Lane MRN: [id number] DOB:1950-08-06, 73 y.o., male 47 Date: 01/29/2024   PCP: Fernand Denyse LABOR, MD  REFERRING PROVIDER: Albina GORMAN Dine, MD   END OF SESSION:  PT End of Session - 01/29/24 1353     Visit Number 3    Number of Visits 25    Date for Recertification  04/15/24    PT Start Time 1356    PT Stop Time 1437    PT Time Calculation (min) 41 min    Activity Tolerance Patient tolerated treatment well          Past Medical History:  Diagnosis Date   Anxiety    Aortic atherosclerosis 02/16/2017   Arthritis    Blind left eye    COPD (chronic obstructive pulmonary disease) (HCC)    Depression    Dysrhythmia    A-fib   GERD (gastroesophageal reflux disease)    Hypertension    Low TSH level 02/17/2017   Myocardial infarction [facility])    PAD (peripheral artery disease)    Paroxysmal atrial fibrillation (HCC)    Perforated duodenal ulcer (HCC)    Stroke (HCC)    left side is weak   Past Surgical History:  Procedure Laterality Date   BRONCHIAL BIOPSY  03/22/2022   Procedure: BRONCHIAL BIOPSIES;  Surgeon: Isadora Hose, MD;  Location: [facility] ENDOSCOPY;  Service: Pulmonary;;   BRONCHIAL NEEDLE ASPIRATION BIOPSY  03/22/2022   Procedure: BRONCHIAL NEEDLE ASPIRATION BIOPSIES;  Surgeon: Isadora Hose, MD;  Location: [facility] ENDOSCOPY;  Service: Pulmonary;;   ENDOBRONCHIAL ULTRASOUND  03/22/2022   Procedure: ENDOBRONCHIAL ULTRASOUND;  Surgeon: Isadora Hose, MD;  Location: [facility] ENDOSCOPY;  Service: Pulmonary;;   EXPLORATORY LAPAROTOMY  05/12/2015   EYE SURGERY     FRACTURE SURGERY     LAPAROTOMY N/A 05/10/2015   Procedure: EXPLORATORY LAPAROTOMY;  Surgeon: Lynda Leos, MD;  Location: [facility] OR;  Service: General;  Laterality: N/A;   LEFT HEART CATH AND CORONARY ANGIOGRAPHY N/A 05/02/2016   Procedure: Left Heart Cath and Coronary Angiography;  Surgeon: Gordy Bergamo, MD;  Location: [facility] INVASIVE CV LAB;   Service: Cardiovascular;  Laterality: N/A;   LOWER EXTREMITY ANGIOGRAPHY Left 09/21/2021   Procedure: Lower Extremity Angiography;  Surgeon: Jama Cordella KANDICE, MD;  Location: [facility] INVASIVE CV LAB;  Service: Cardiovascular;  Laterality: Left;   Patient Active Problem List   Diagnosis Date Noted   Aphasia 12/31/2023   Arthritis of left shoulder 12/31/2023   Allergic rhinitis due to pollen 12/11/2022   Tinea cruris 12/11/2022   BPH associated with nocturia 12/11/2022   Chest pain, non-cardiac 04/11/2022   Adenocarcinoma of lower lobe of right lung (HCC) 03/28/2022   Pulmonary nodule 1 cm or greater in diameter 03/22/2022   Leg weakness, bilateral 02/09/2022   Osteopenia 10/13/2021   PAD (peripheral artery disease) 09/21/2021   GERD without esophagitis 09/21/2021   Dyslipidemia 09/21/2021   Depression 09/21/2021   Atrial flutter (HCC) 09/21/2021   Chronic obstructive pulmonary disease (COPD) (HCC) 09/21/2021   Coronary artery disease 09/21/2021   Cerebrovascular accident (CVA) due to embolism of cerebral artery (HCC) 09/06/2021   Bilateral edema of lower extremity 08/05/2020   Vasovagal syncope 08/02/2020   Annual physical exam 12/26/2019   Hyperlipidemia 12/26/2019   Abrasion of right hand 12/26/2019   Metabolic syndrome 11/11/2019   Low TSH level 02/17/2017   Atrial flutter with rapid ventricular response (HCC) 02/16/2017   Cigarette smoker 02/16/2017   COPD with acute  exacerbation (HCC) 02/16/2017   Elevated lactic acid level 02/16/2017   Aortic atherosclerosis 02/16/2017   Productive cough    Hypoxia 05/24/2015   SOB (shortness of breath) 05/24/2015   COPD suggested by initial evaluation 05/17/2015   Essential hypertension 05/17/2015   Smoking 05/17/2015    ONSET DATE: Unsure, a long time   REFERRING DIAG:  M19.012 (ICD-10-CM) - Arthritis of left shoulder  I63.40 (ICD-10-CM) - Cerebrovascular accident (CVA) due to embolism of cerebral artery (HCC)     THERAPY DIAG:   Left shoulder pain, unspecified chronicity  Unsteadiness on feet  Other lack of coordination  Muscle weakness (generalized)  Cognitive communication deficit  Rationale for Evaluation and Treatment: Rehabilitation  SUBJECTIVE:                                                                                                                                                                                             SUBJECTIVE STATEMENT:  01/24/2024: Patient arrived with sister in-law from OT session. He has been doing his exercises at home. He reports his L knee is bothering him today with 2/10 pain and that his R shoulder is a 4/10 upon arrival.    At Eval: Patient's sister in-law was present to assist with subjective portion of evaluation. Patient had CVA in June effecting R side. He did not have PT treatment following CVA initially due to focusing on speech first. Patient was noted to have damage to the Wernicke's Area causing aphasia making communication slightly difficult during today's evaluation. Patient walks without AD at home, but has decreased balance when walking. His sister in-law reports the R leg gave out last week and he fell in bedroom. He has also fallen in past when performing activities on uneven surfaces such as taking out garbage. He does do a little yard work such as picking up limbs and helping out in yard, but not much.   Patient reports that he has had L shoulder pain for a long time. Cannot say if he has had any previous injuries and does not think he has had imaging of shoulder. He expressed having sharp pain with movement of L shoulder from the shoulder to the elbow.    Pt accompanied by: Sister in-law.   PERTINENT HISTORY: CVA effecting L side in June 2025, Wernicke's aphasia, R shoulder pain with movement.   PAIN:  Are you having pain? 9/10, but has difficulty describing.   PRECAUTIONS: Fall and Other: Decreased safety awareness.  RED  FLAGS: None   WEIGHT BEARING RESTRICTIONS: No  FALLS: Has patient fallen in last 6 months? Yes. Number of falls Patient is  unsure.   LIVING ENVIRONMENT: Lives with: lives with their family Lives in: House/apartment Stairs: Yes: External: 4 steps; on right going up and on left going up Has following equipment at home: None  PLOF: Independent  PATIENT GOALS: Improve L shoulder function/pain levels; Improve balance with gait/ADLs.   OBJECTIVE:  Note: Objective measures were completed at Evaluation unless otherwise noted.  DIAGNOSTIC FINDINGS:   COMPARISON:  CT head 09/06/2021.   FINDINGS: Brain: Encephalomalacia in the anterior right temporal and frontal lobes. Smaller area of encephalomalacia in the inferior left frontal lobe. Remote lacunar infarct in the posterior limb of the left internal capsule. No evidence of acute large vascular territory, acute hemorrhage, mass lesion or midline shift.  COGNITION: Overall cognitive status: Wernicke's Aphasia    SENSATION: Not tested  COORDINATION: Decreased speed with toe taps.     POSTURE: rounded shoulders, forward head, and increased thoracic kyphosis   LOWER EXTREMITY MMT:    MMT Right Eval Left Eval  Hip flexion 4 3+  Hip extension    Hip abduction 5 5  Hip adduction    Hip internal rotation    Hip external rotation    Knee flexion 5 4+  Knee extension 5 4  Ankle dorsiflexion 5 5  Ankle plantarflexion    Ankle inversion    Ankle eversion    (Blank rows = not tested)  UPPER EXTREMITY MMT:  MMT Right eval Left eval  Shoulder flexion 5 4-  Shoulder extension    Shoulder abduction 5 4  Shoulder adduction    Shoulder extension    Shoulder internal rotation 5 5  Shoulder external rotation 5 4  Middle trapezius    Lower trapezius    Elbow flexion    Elbow extension    Wrist flexion    Wrist extension    Wrist ulnar deviation    Wrist radial deviation    Wrist pronation    Wrist supination     Grip strength     (Blank rows = not tested)   UPPER EXTREMITY ROM:  Active ROM Right eval Left eval  Shoulder flexion 155 108  Shoulder extension    Shoulder abduction 160 90  Shoulder adduction    Shoulder extension    Shoulder internal rotation [facility]  Shoulder external rotation 57 40  Elbow flexion    Elbow extension    Wrist flexion    Wrist extension    Wrist ulnar deviation    Wrist radial deviation    Wrist pronation    Wrist supination     (Blank rows = not tested)  BED MOBILITY:  Not tested  TRANSFERS: Sit to stand: Complete Independence and CGA  Assistive device utilized: Use of one upper extremity to assist trunk lift.      RAMP:  Not tested  CURB:  Not tested  STAIRS: Not tested GAIT: Findings: Gait Characteristics: decreased arm swing- Right, decreased arm swing- Left, decreased step length- Right, decreased step length- Left, and decreased stride length, Distance walked: 50', and Comments: .  FUNCTIONAL TESTS:  5 times sit to stand: 27.7'' Timed up and go (TUG): 65'' 10 meter walk test: 45''   PATIENT SURVEYS:  Unable to perform survey due to patient's cognition.  TREATMENT DATE: 01/22/2024     -Seated shoulder AAROM wand flexion: 3x10  -Seated L shoulder AAROM wand ER: 3x10  -Seated RTB IR 2x10   -Seated RTB ER 2x10  -Seated Rows RTB 3x10  -Seated RTB scap squeeze with bilat. ER. 2x10   -Seated wall slides with wash cloth. 2x10.   -LAQ- Seated with 3 lb. AW 2x10 each.  -Marching - seated with 3 lb. AW 2x10 each.  -Seated Clamshells with GTB 3x10.      PATIENT EDUCATION: Education details: Patient educated on exercises to perform at home to improve L shoulder mobility and strength with proper mechanics/positioning. Person educated: Patient and sister in-law Education method: Explanation,  Demonstration, Verbal cues, and Handouts Education comprehension: returned demonstration  HOME EXERCISE PROGRAM:  Access Code: [email or web address] URL: [email or web address] Date: 01/22/2024 Prepared by: Norman Sharps  Exercises - Seated Shoulder External Rotation AAROM with Cane and Hand in Neutral  - 1 x daily - 7 x weekly - 1 sets - 15 reps - Seated Scapular Retraction  - 1 x daily - 7 x weekly - 1 sets - 15 reps - Supine Shoulder Flexion Extension AAROM with Dowel  - 1 x daily - 7 x weekly - 1 sets - 15 reps - Supine Shoulder Press with Dowel  - 1 x daily - 7 x weekly - 1 sets - 15 reps - Seated Shoulder Flexion AAROM with Dowel  - 1 x daily - 7 x weekly - 1 sets - 15 reps  GOALS: Goals reviewed with patient? Yes  SHORT TERM GOALS: Target date: 02/19/2024  Patient will be independent with HEP allowing him to perform exercises at home with proper positioning/mechanics/safety.  Baseline: HEP initiated at evaluation with handout.  Goal status: INITIAL    LONG TERM GOALS: Target date: 04/15/2024  1.  Patient (> 27 years old) will complete five times sit to stand test in < 15 seconds indicating an increased LE strength and improved balance. Baseline: Test at 1st treatment. Goal status: INITIAL  2.  Patient will improve gait mechanics showing good arm swing, foot clearance and increased stride length allowing him to ambulate more efficiently. Baseline: Decreased UE movement, lack of bilateral foot clearance during swing phase, decreased stride length, decreased gait speed. Goal status: INITIAL   3.  Patient will reduce timed up and go to <11 seconds to reduce fall risk and demonstrate improved transfer/gait ability. Baseline: 65'' without AD. Goal status: INITIAL  4.  Patient will increase 10 meter walk test to >1.52m/s as to improve gait speed for better community ambulation and to reduce fall risk. Baseline: 65'' Goal status: INITIAL  5.  Patient will improve L shouder  flexion/abduction AROM to match that of the R by discharge allowing him to reach objects at greater heights such as in kitchen cabinets.  Baseline: Flexion - 108 degrees; Abduction - 90 degrees.  Goal status: INITIAL  6.  Patient will improve L shoulder strength to 4+/5 MMT by discharge allowing him to hold and carry items in L upper extremity with decreased difficulty.  Baseline: See MMT table.  Goal status: INITIAL   ASSESSMENT:  CLINICAL IMPRESSION: Patient began session with wand AAROM exercises while seated in chair to improve strength and mobility in the L shoulder with assist of R UE. Towel under L arm during ER used to facilitate good mechanics allowing verb cues provided. He then performed L shoulder rotator cuff strengthening exercises utilizing red band for resistance. Theraband periscapular strength/stability exercises also performed  today using RTB for resisance with patient in seated position.   LE strengthening exercises performed using AW included LAQ, seated marching, and seated clamshells. Patient had much more weakness in L LE compared to R when performing exercises today.   Overall, patient performed fair today as his L knee pain was limiting our ability to perform any standing activities. Patient continues to work hard during PT and is able to respond well to cues if given with demonstration along with verbal. He continues to show weakness and decreased mobility in L shoulder as well as decreased strength in L LE. Pt will benefit from continuing PT at this time for progression toward goals.    OBJECTIVE IMPAIRMENTS: Abnormal gait, decreased activity tolerance, decreased balance, decreased coordination, difficulty walking, decreased ROM, decreased strength, impaired UE functional use, and pain.   ACTIVITY LIMITATIONS: carrying, lifting, standing, squatting, stairs, and transfers  PARTICIPATION LIMITATIONS: meal prep, cleaning, laundry, interpersonal relationship, and yard  work  PERSONAL FACTORS: Age, Past/current experiences, and 3+ comorbidities: Stroke, A -fib, PAD, HTN, hx. Of MI, COPD, blind in L eye. are also affecting patient's functional outcome.   REHAB POTENTIAL: Fair Due to patient's difficulty with communication.  CLINICAL DECISION MAKING: Evolving/moderate complexity  EVALUATION COMPLEXITY: Moderate  PLAN:  PT FREQUENCY: 1-2x/week  PT DURATION: 12 weeks  PLANNED INTERVENTIONS: 97164- PT Re-evaluation, 97750- Physical Performance Testing, 97110-Therapeutic exercises, 97530- Therapeutic activity, [id number]- Neuromuscular re-education, 97535- Self Care, [id number]- Manual therapy, [id number]- Gait training, [phone]- Electrical stimulation (unattended), [phone]- Electrical stimulation (manual), Patient/Family education, Balance training, Stair training, Taping, Joint mobilization, Cryotherapy, and Moist heat  PLAN FOR NEXT SESSION:  -Continue working to improve functional movement, gait, transfers, and L shoulder strength/mobility.   Norman KATHEE Sharps, PT 01/29/2024, 2:39 PM

## 2024-01-31 ENCOUNTER — Ambulatory Visit

## 2024-01-31 ENCOUNTER — Ambulatory Visit: Admitting: Speech Pathology

## 2024-01-31 ENCOUNTER — Encounter: Admitting: Occupational Therapy

## 2024-02-04 ENCOUNTER — Encounter: Payer: Self-pay | Admitting: Cardiovascular Disease

## 2024-02-04 ENCOUNTER — Ambulatory Visit: Admitting: Cardiovascular Disease

## 2024-02-04 VITALS — BP 129/84 | HR 50 | Ht 73.0 in | Wt 195.4 lb

## 2024-02-04 DIAGNOSIS — J301 Allergic rhinitis due to pollen: Secondary | ICD-10-CM

## 2024-02-04 DIAGNOSIS — Z8673 Personal history of transient ischemic attack (TIA), and cerebral infarction without residual deficits: Secondary | ICD-10-CM

## 2024-02-04 DIAGNOSIS — I2510 Atherosclerotic heart disease of native coronary artery without angina pectoris: Secondary | ICD-10-CM

## 2024-02-04 DIAGNOSIS — I4892 Unspecified atrial flutter: Secondary | ICD-10-CM

## 2024-02-04 DIAGNOSIS — E782 Mixed hyperlipidemia: Secondary | ICD-10-CM

## 2024-02-04 DIAGNOSIS — K219 Gastro-esophageal reflux disease without esophagitis: Secondary | ICD-10-CM

## 2024-02-04 DIAGNOSIS — I10 Essential (primary) hypertension: Secondary | ICD-10-CM

## 2024-02-04 DIAGNOSIS — I700 Atherosclerosis of aorta: Secondary | ICD-10-CM

## 2024-02-04 DIAGNOSIS — I739 Peripheral vascular disease, unspecified: Secondary | ICD-10-CM

## 2024-02-04 MED ORDER — FUROSEMIDE 20 MG PO TABS: 20.0000 mg | ORAL_TABLET | Freq: Every day | ORAL | 2 refills | Status: AC

## 2024-02-04 MED ORDER — APIXABAN 5 MG PO TABS: 5.0000 mg | ORAL_TABLET | Freq: Two times a day (BID) | ORAL | 3 refills | Status: AC

## 2024-02-04 MED ORDER — LOSARTAN POTASSIUM 50 MG PO TABS: 50.0000 mg | ORAL_TABLET | Freq: Two times a day (BID) | ORAL | 11 refills | Status: AC

## 2024-02-04 MED ORDER — ATORVASTATIN CALCIUM 40 MG PO TABS: 40.0000 mg | ORAL_TABLET | Freq: Every day | ORAL | 3 refills | Status: AC

## 2024-02-04 MED ORDER — PANTOPRAZOLE SODIUM 40 MG PO TBEC: 40.0000 mg | DELAYED_RELEASE_TABLET | Freq: Two times a day (BID) | ORAL | 1 refills | Status: AC

## 2024-02-04 NOTE — Addendum Note (Signed)
 Addended by: FERNAND ALTER A on: 02/04/2024 01:42 PM   Modules accepted: Orders

## 2024-02-04 NOTE — Progress Notes (Signed)
 Cardiology Office Note   Date:  02/04/2024   ID:  Gregory Lane, DOB Nov 18, 1950, MRN [id number]  PCP:  Fernand Denyse LABOR, MD  Cardiologist:  Denyse Fernand, MD      History of Present Illness: Gregory Lane is a 73 y.o. male who presents for  Chief Complaint  Patient presents with   Follow-up    Med Refills    Has shoulder pain, and dizziness as  has bradycardia.      Past Medical History:  Diagnosis Date   Anxiety    Aortic atherosclerosis 02/16/2017   Arthritis    Blind left eye    COPD (chronic obstructive pulmonary disease) (HCC)    Depression    Dysrhythmia    A-fib   GERD (gastroesophageal reflux disease)    Hypertension    Low TSH level 02/17/2017   Myocardial infarction [facility])    PAD (peripheral artery disease)    Paroxysmal atrial fibrillation (HCC)    Perforated duodenal ulcer (HCC)    Stroke (HCC)    left side is weak     Past Surgical History:  Procedure Laterality Date   BRONCHIAL BIOPSY  03/22/2022   Procedure: BRONCHIAL BIOPSIES;  Surgeon: Isadora Hose, MD;  Location: [facility] ENDOSCOPY;  Service: Pulmonary;;   BRONCHIAL NEEDLE ASPIRATION BIOPSY  03/22/2022   Procedure: BRONCHIAL NEEDLE ASPIRATION BIOPSIES;  Surgeon: Isadora Hose, MD;  Location: [facility] ENDOSCOPY;  Service: Pulmonary;;   ENDOBRONCHIAL ULTRASOUND  03/22/2022   Procedure: ENDOBRONCHIAL ULTRASOUND;  Surgeon: Isadora Hose, MD;  Location: [facility] ENDOSCOPY;  Service: Pulmonary;;   EXPLORATORY LAPAROTOMY  05/12/2015   EYE SURGERY     FRACTURE SURGERY     LAPAROTOMY N/A 05/10/2015   Procedure: EXPLORATORY LAPAROTOMY;  Surgeon: Lynda Leos, MD;  Location: [facility] OR;  Service: General;  Laterality: N/A;   LEFT HEART CATH AND CORONARY ANGIOGRAPHY N/A 05/02/2016   Procedure: Left Heart Cath and Coronary Angiography;  Surgeon: Gordy Bergamo, MD;  Location: [facility] INVASIVE CV LAB;  Service: Cardiovascular;  Laterality: N/A;   LOWER EXTREMITY ANGIOGRAPHY Left 09/21/2021   Procedure: Lower Extremity  Angiography;  Surgeon: Jama Cordella KANDICE, MD;  Location: [facility] INVASIVE CV LAB;  Service: Cardiovascular;  Laterality: Left;     Current Outpatient Medications  Medication Sig Dispense Refill   celecoxib  (CELEBREX ) 200 MG capsule Take 1 capsule (200 mg total) by mouth 2 (two) times daily. 60 capsule 1   cetirizine  (ZYRTEC ) 10 MG tablet Take 1 tablet (10 mg total) by mouth daily. 90 tablet 1   diclofenac  Sodium (VOLTAREN ) 1 % GEL Apply 2 g topically 3 (three) times daily. Apply to left shoulder 200 g 1   FLUoxetine  (PROZAC ) 20 MG capsule TAKE 2 CAPSULES BY MOUTH EVERY DAY 180 capsule 0   apixaban  (ELIQUIS ) 5 MG TABS tablet Take 1 tablet (5 mg total) by mouth 2 (two) times daily. 60 tablet 3   atorvastatin  (LIPITOR) 40 MG tablet Take 1 tablet (40 mg total) by mouth daily. 90 tablet 3   furosemide  (LASIX ) 20 MG tablet Take 1 tablet (20 mg total) by mouth daily. 30 tablet 2   losartan  (COZAAR ) 50 MG tablet Take 1 tablet (50 mg total) by mouth 2 (two) times daily. 60 tablet 11   pantoprazole  (PROTONIX ) 40 MG tablet Take 1 tablet (40 mg total) by mouth 2 (two) times daily. 180 tablet 1   No current facility-administered medications for this visit.    Allergies:   Patient has no known allergies.  Social History:   reports that he has been smoking cigarettes. He has a 32.5 pack-year smoking history. He has never used smokeless tobacco. He reports current alcohol use of about 60.0 standard drinks of alcohol per week. He reports that he does not use drugs.   Family History:  family history includes Arrhythmia in his sister; Arthritis in his sister; Cancer in his father; Cirrhosis in his father; Dementia in his mother; Heart murmur in his sister; Hypertension in his mother and sister; Lung cancer in his sister.    ROS:     Review of Systems  Constitutional: Negative.   HENT: Negative.    Eyes: Negative.   Respiratory: Negative.    Gastrointestinal: Negative.   Genitourinary: Negative.    Musculoskeletal: Negative.   Skin: Negative.   Neurological: Negative.   Endo/Heme/Allergies: Negative.   Psychiatric/Behavioral: Negative.    All other systems reviewed and are negative.     All other systems are reviewed and negative.    PHYSICAL EXAM: VS:  BP 129/84   Pulse (!) 50   Ht 6' 1 (1.854 m)   Wt 195 lb 6.4 oz (88.6 kg)   SpO2 96%   BMI 25.78 kg/m  , BMI Body mass index is 25.78 kg/m. Last weight:  Wt Readings from Last 3 Encounters:  02/04/24 195 lb 6.4 oz (88.6 kg)  01/14/24 188 lb 12.8 oz (85.6 kg)  12/31/23 191 lb 3.2 oz (86.7 kg)     Physical Exam Vitals reviewed.  Constitutional:      Appearance: Normal appearance. He is normal weight.  HENT:     Head: Normocephalic.     Nose: Nose normal.     Mouth/Throat:     Mouth: Mucous membranes are moist.  Eyes:     Pupils: Pupils are equal, round, and reactive to light.  Cardiovascular:     Rate and Rhythm: Normal rate and regular rhythm.     Pulses: Normal pulses.     Heart sounds: Normal heart sounds.  Pulmonary:     Effort: Pulmonary effort is normal.  Abdominal:     General: Abdomen is flat. Bowel sounds are normal.  Musculoskeletal:        General: Normal range of motion.     Cervical back: Normal range of motion.  Skin:    General: Skin is warm.  Neurological:     General: No focal deficit present.     Mental Status: He is alert.  Psychiatric:        Mood and Affect: Mood normal.       EKG:   Recent Labs: 12/31/2023: ALT 10; BUN 15; Creatinine, Ser 0.93; Hemoglobin 13.7; Platelets 198; Potassium 4.3; Sodium 137    Lipid Panel    Component Value Date/Time   CHOL 178 12/31/2023 1405   TRIG 61 12/31/2023 1405   HDL 44 12/31/2023 1405   CHOLHDL 4.0 12/31/2023 1405   CHOLHDL 5.1 (H) 12/19/2019 1114   VLDL 17 05/24/2015 2320   LDLCALC 122 (H) 12/31/2023 1405   LDLCALC 160 (H) 12/19/2019 1114      Other studies Reviewed: Additional studies/ records that were reviewed today  include:  Review of the above records demonstrates:      03/01/2022    2:50 PM  PAD Screen  Previous PAD dx? Yes  Previous surgical procedure? Yes  Dates of procedures 2014  Pain with walking? Yes  Subsides with rest? No  Feet/toe relief with dangling? No  Painful, non-healing ulcers? No  Extremities discolored? Yes      ASSESSMENT AND PLAN:    ICD-10-CM   1. History of stroke  Z86.73 furosemide  (LASIX ) 20 MG tablet    PCV ECHOCARDIOGRAM COMPLETE    2. Coronary artery disease involving native coronary artery of native heart without angina pectoris  I25.10 furosemide  (LASIX ) 20 MG tablet    atorvastatin  (LIPITOR) 40 MG tablet    losartan  (COZAAR ) 50 MG tablet    PCV ECHOCARDIOGRAM COMPLETE    3. PAD (peripheral artery disease)  I73.9 furosemide  (LASIX ) 20 MG tablet    losartan  (COZAAR ) 50 MG tablet    PCV ECHOCARDIOGRAM COMPLETE    4. Essential hypertension  I10 furosemide  (LASIX ) 20 MG tablet    losartan  (COZAAR ) 50 MG tablet    PCV ECHOCARDIOGRAM COMPLETE    5. Atrial flutter, unspecified type (HCC)  I48.92 furosemide  (LASIX ) 20 MG tablet    losartan  (COZAAR ) 50 MG tablet    PCV ECHOCARDIOGRAM COMPLETE    6. Mixed hyperlipidemia  E78.2 furosemide  (LASIX ) 20 MG tablet    PCV ECHOCARDIOGRAM COMPLETE    7. Allergic rhinitis due to pollen, unspecified seasonality  J30.1 furosemide  (LASIX ) 20 MG tablet    PCV ECHOCARDIOGRAM COMPLETE    8. Aortic atherosclerosis  I70.0 losartan  (COZAAR ) 50 MG tablet    PCV ECHOCARDIOGRAM COMPLETE    9. Coronary artery disease involving native coronary artery of native heart without angina pectoris  I25.10 furosemide  (LASIX ) 20 MG tablet    atorvastatin  (LIPITOR) 40 MG tablet    losartan  (COZAAR ) 50 MG tablet    PCV ECHOCARDIOGRAM COMPLETE   Denies any chest pain or shortness of breath    10. Essential hypertension  I10 furosemide  (LASIX ) 20 MG tablet    losartan  (COZAAR ) 50 MG tablet    PCV ECHOCARDIOGRAM COMPLETE   Stable.     11. Atrial flutter, unspecified type (HCC)  I48.92 furosemide  (LASIX ) 20 MG tablet    losartan  (COZAAR ) 50 MG tablet    PCV ECHOCARDIOGRAM COMPLETE   Occasionally feels palpitation    12. GERD without esophagitis  K21.9 pantoprazole  (PROTONIX ) 40 MG tablet    PCV ECHOCARDIOGRAM COMPLETE       Problem List Items Addressed This Visit       Cardiovascular and Mediastinum   Essential hypertension   Relevant Medications   furosemide  (LASIX ) 20 MG tablet   atorvastatin  (LIPITOR) 40 MG tablet   apixaban  (ELIQUIS ) 5 MG TABS tablet   losartan  (COZAAR ) 50 MG tablet   Other Relevant Orders   PCV ECHOCARDIOGRAM COMPLETE   Aortic atherosclerosis   Relevant Medications   furosemide  (LASIX ) 20 MG tablet   atorvastatin  (LIPITOR) 40 MG tablet   apixaban  (ELIQUIS ) 5 MG TABS tablet   losartan  (COZAAR ) 50 MG tablet   Other Relevant Orders   PCV ECHOCARDIOGRAM COMPLETE   PAD (peripheral artery disease)   Relevant Medications   furosemide  (LASIX ) 20 MG tablet   atorvastatin  (LIPITOR) 40 MG tablet   apixaban  (ELIQUIS ) 5 MG TABS tablet   losartan  (COZAAR ) 50 MG tablet   Other Relevant Orders   PCV ECHOCARDIOGRAM COMPLETE   Atrial flutter (HCC)   Relevant Medications   furosemide  (LASIX ) 20 MG tablet   atorvastatin  (LIPITOR) 40 MG tablet   apixaban  (ELIQUIS ) 5 MG TABS tablet   losartan  (COZAAR ) 50 MG tablet   Other Relevant Orders   PCV ECHOCARDIOGRAM COMPLETE   Coronary artery disease   Relevant Medications   furosemide  (LASIX ) 20 MG tablet  atorvastatin  (LIPITOR) 40 MG tablet   apixaban  (ELIQUIS ) 5 MG TABS tablet   losartan  (COZAAR ) 50 MG tablet   Other Relevant Orders   PCV ECHOCARDIOGRAM COMPLETE     Respiratory   Allergic rhinitis due to pollen   Relevant Medications   furosemide  (LASIX ) 20 MG tablet   Other Relevant Orders   PCV ECHOCARDIOGRAM COMPLETE     Digestive   GERD without esophagitis   Relevant Medications   pantoprazole  (PROTONIX ) 40 MG tablet   Other  Relevant Orders   PCV ECHOCARDIOGRAM COMPLETE     Other   Hyperlipidemia   Relevant Medications   furosemide  (LASIX ) 20 MG tablet   atorvastatin  (LIPITOR) 40 MG tablet   apixaban  (ELIQUIS ) 5 MG TABS tablet   losartan  (COZAAR ) 50 MG tablet   Other Relevant Orders   PCV ECHOCARDIOGRAM COMPLETE   Other Visit Diagnoses       History of stroke    -  Primary   Relevant Medications   furosemide  (LASIX ) 20 MG tablet   Other Relevant Orders   PCV ECHOCARDIOGRAM COMPLETE          Disposition:   Return in about 4 weeks (around 03/03/2024) for echo and f/u.    Total time spent: 30 minutes  Signed,  Denyse Bathe, MD  02/04/2024 1:40 PM    [facility]

## 2024-02-05 ENCOUNTER — Encounter: Admitting: Occupational Therapy

## 2024-02-05 ENCOUNTER — Ambulatory Visit: Admitting: Speech Pathology

## 2024-02-05 ENCOUNTER — Ambulatory Visit

## 2024-02-05 ENCOUNTER — Other Ambulatory Visit: Payer: Self-pay

## 2024-02-05 DIAGNOSIS — R4701 Aphasia: Secondary | ICD-10-CM | POA: Diagnosis not present

## 2024-02-05 DIAGNOSIS — R2681 Unsteadiness on feet: Secondary | ICD-10-CM

## 2024-02-05 DIAGNOSIS — R972 Elevated prostate specific antigen [PSA]: Secondary | ICD-10-CM

## 2024-02-05 DIAGNOSIS — M6281 Muscle weakness (generalized): Secondary | ICD-10-CM

## 2024-02-05 DIAGNOSIS — R41841 Cognitive communication deficit: Secondary | ICD-10-CM

## 2024-02-05 DIAGNOSIS — M25512 Pain in left shoulder: Secondary | ICD-10-CM

## 2024-02-05 DIAGNOSIS — R278 Other lack of coordination: Secondary | ICD-10-CM

## 2024-02-05 NOTE — Therapy (Signed)
 OUTPATIENT PHYSICAL THERAPY NEURO EVALUATION   Patient Name: Gregory Lane MRN: [id number] DOB:07/15/1950, 73 y.o., male Today's Date: 02/05/2024   PCP: Fernand Denyse LABOR, MD  REFERRING PROVIDER: Albina GORMAN Dine, MD   END OF SESSION:  PT End of Session - 02/05/24 1443     Visit Number 4    PT Start Time 0245    PT Stop Time 0325    PT Time Calculation (min) 40 min    Activity Tolerance Patient tolerated treatment well          Past Medical History:  Diagnosis Date   Anxiety    Aortic atherosclerosis 02/16/2017   Arthritis    Blind left eye    COPD (chronic obstructive pulmonary disease) (HCC)    Depression    Dysrhythmia    A-fib   GERD (gastroesophageal reflux disease)    Hypertension    Low TSH level 02/17/2017   Myocardial infarction [facility])    PAD (peripheral artery disease)    Paroxysmal atrial fibrillation (HCC)    Perforated duodenal ulcer (HCC)    Stroke (HCC)    left side is weak   Past Surgical History:  Procedure Laterality Date   BRONCHIAL BIOPSY  03/22/2022   Procedure: BRONCHIAL BIOPSIES;  Surgeon: Isadora Hose, MD;  Location: [facility] ENDOSCOPY;  Service: Pulmonary;;   BRONCHIAL NEEDLE ASPIRATION BIOPSY  03/22/2022   Procedure: BRONCHIAL NEEDLE ASPIRATION BIOPSIES;  Surgeon: Isadora Hose, MD;  Location: [facility] ENDOSCOPY;  Service: Pulmonary;;   ENDOBRONCHIAL ULTRASOUND  03/22/2022   Procedure: ENDOBRONCHIAL ULTRASOUND;  Surgeon: Isadora Hose, MD;  Location: [facility] ENDOSCOPY;  Service: Pulmonary;;   EXPLORATORY LAPAROTOMY  05/12/2015   EYE SURGERY     FRACTURE SURGERY     LAPAROTOMY N/A 05/10/2015   Procedure: EXPLORATORY LAPAROTOMY;  Surgeon: Lynda Leos, MD;  Location: [facility] OR;  Service: General;  Laterality: N/A;   LEFT HEART CATH AND CORONARY ANGIOGRAPHY N/A 05/02/2016   Procedure: Left Heart Cath and Coronary Angiography;  Surgeon: Gordy Bergamo, MD;  Location: [facility] INVASIVE CV LAB;  Service: Cardiovascular;  Laterality: N/A;   LOWER EXTREMITY  ANGIOGRAPHY Left 09/21/2021   Procedure: Lower Extremity Angiography;  Surgeon: Jama Cordella KANDICE, MD;  Location: [facility] INVASIVE CV LAB;  Service: Cardiovascular;  Laterality: Left;   Patient Active Problem List   Diagnosis Date Noted   Aphasia 12/31/2023   Arthritis of left shoulder 12/31/2023   Allergic rhinitis due to pollen 12/11/2022   Tinea cruris 12/11/2022   BPH associated with nocturia 12/11/2022   Chest pain, non-cardiac 04/11/2022   Adenocarcinoma of lower lobe of right lung (HCC) 03/28/2022   Pulmonary nodule 1 cm or greater in diameter 03/22/2022   Leg weakness, bilateral 02/09/2022   Osteopenia 10/13/2021   PAD (peripheral artery disease) 09/21/2021   GERD without esophagitis 09/21/2021   Dyslipidemia 09/21/2021   Depression 09/21/2021   Atrial flutter (HCC) 09/21/2021   Chronic obstructive pulmonary disease (COPD) (HCC) 09/21/2021   Coronary artery disease 09/21/2021   Cerebrovascular accident (CVA) due to embolism of cerebral artery (HCC) 09/06/2021   Bilateral edema of lower extremity 08/05/2020   Vasovagal syncope 08/02/2020   Annual physical exam 12/26/2019   Hyperlipidemia 12/26/2019   Abrasion of right hand 12/26/2019   Metabolic syndrome 11/11/2019   Low TSH level 02/17/2017   Atrial flutter with rapid ventricular response (HCC) 02/16/2017   Cigarette smoker 02/16/2017   COPD with acute exacerbation (HCC) 02/16/2017   Elevated lactic acid level 02/16/2017   Aortic atherosclerosis 02/16/2017  Productive cough    Hypoxia 05/24/2015   SOB (shortness of breath) 05/24/2015   COPD suggested by initial evaluation 05/17/2015   Essential hypertension 05/17/2015   Smoking 05/17/2015    ONSET DATE: Unsure, a long time   REFERRING DIAG:  M19.012 (ICD-10-CM) - Arthritis of left shoulder  I63.40 (ICD-10-CM) - Cerebrovascular accident (CVA) due to embolism of cerebral artery (HCC)     THERAPY DIAG:  Left shoulder pain, unspecified chronicity  Unsteadiness  on feet  Other lack of coordination  Muscle weakness (generalized)  Cognitive communication deficit  Rationale for Evaluation and Treatment: Rehabilitation  SUBJECTIVE:                                                                                                                                                                                             SUBJECTIVE STATEMENT:  02/05/2024: Pt denies falling since previous visit. When shown a pain scale he reports L shoulder is a 4/10.    At Eval: Patient's sister in-law was present to assist with subjective portion of evaluation. Patient had CVA in June effecting R side. He did not have PT treatment following CVA initially due to focusing on speech first. Patient was noted to have damage to the Wernicke's Area causing aphasia making communication slightly difficult during today's evaluation. Patient walks without AD at home, but has decreased balance when walking. His sister in-law reports the R leg gave out last week and he fell in bedroom. He has also fallen in past when performing activities on uneven surfaces such as taking out garbage. He does do a little yard work such as picking up limbs and helping out in yard, but not much.   Patient reports that he has had L shoulder pain for a long time. Cannot say if he has had any previous injuries and does not think he has had imaging of shoulder. He expressed having sharp pain with movement of L shoulder from the shoulder to the elbow.    Pt accompanied by: Sister in-law.   PERTINENT HISTORY: CVA effecting L side in June 2025, Wernicke's aphasia, R shoulder pain with movement.   PAIN:  Are you having pain? 9/10, but has difficulty describing.   PRECAUTIONS: Fall and Other: Decreased safety awareness.  RED FLAGS: None   WEIGHT BEARING RESTRICTIONS: No  FALLS: Has patient fallen in last 6 months? Yes. Number of falls Patient is unsure.   LIVING ENVIRONMENT: Lives with: lives with  their family Lives in: House/apartment Stairs: Yes: External: 4 steps; on right going up and on left going up Has following equipment at home: None  PLOF: Independent  PATIENT GOALS: Improve L shoulder function/pain levels; Improve balance with gait/ADLs.   OBJECTIVE:  Note: Objective measures were completed at Evaluation unless otherwise noted.  DIAGNOSTIC FINDINGS:   COMPARISON:  CT head 09/06/2021.   FINDINGS: Brain: Encephalomalacia in the anterior right temporal and frontal lobes. Smaller area of encephalomalacia in the inferior left frontal lobe. Remote lacunar infarct in the posterior limb of the left internal capsule. No evidence of acute large vascular territory, acute hemorrhage, mass lesion or midline shift.  COGNITION: Overall cognitive status: Wernicke's Aphasia    SENSATION: Not tested  COORDINATION: Decreased speed with toe taps.     POSTURE: rounded shoulders, forward head, and increased thoracic kyphosis   LOWER EXTREMITY MMT:    MMT Right Eval Left Eval  Hip flexion 4 3+  Hip extension    Hip abduction 5 5  Hip adduction    Hip internal rotation    Hip external rotation    Knee flexion 5 4+  Knee extension 5 4  Ankle dorsiflexion 5 5  Ankle plantarflexion    Ankle inversion    Ankle eversion    (Blank rows = not tested)  UPPER EXTREMITY MMT:  MMT Right eval Left eval  Shoulder flexion 5 4-  Shoulder extension    Shoulder abduction 5 4  Shoulder adduction    Shoulder extension    Shoulder internal rotation 5 5  Shoulder external rotation 5 4  Middle trapezius    Lower trapezius    Elbow flexion    Elbow extension    Wrist flexion    Wrist extension    Wrist ulnar deviation    Wrist radial deviation    Wrist pronation    Wrist supination    Grip strength     (Blank rows = not tested)   UPPER EXTREMITY ROM:  Active ROM Right eval Left eval  Shoulder flexion 155 108  Shoulder extension    Shoulder abduction 160 90   Shoulder adduction    Shoulder extension    Shoulder internal rotation [facility]  Shoulder external rotation 57 40  Elbow flexion    Elbow extension    Wrist flexion    Wrist extension    Wrist ulnar deviation    Wrist radial deviation    Wrist pronation    Wrist supination     (Blank rows = not tested)  BED MOBILITY:  Not tested  TRANSFERS: Sit to stand: Complete Independence and CGA  Assistive device utilized: Use of one upper extremity to assist trunk lift.      RAMP:  Not tested  CURB:  Not tested  STAIRS: Not tested GAIT: Findings: Gait Characteristics: decreased arm swing- Right, decreased arm swing- Left, decreased step length- Right, decreased step length- Left, and decreased stride length, Distance walked: 50', and Comments: .  FUNCTIONAL TESTS:  5 times sit to stand: 27.7'' Timed up and go (TUG): 65'' 10 meter walk test: 45''   PATIENT SURVEYS:  Unable to perform survey due to patient's cognition.  TREATMENT DATE: 01/22/2024     -Seated shoulder AAROM wand flexion: 3x10 (attempted AROM flexion, but was too painful).  -Seated L shoulder AAROM wand ER: 3x10 -Seated RTB IR 2x10  -Seated RTB ER 3x10 -Seated Rows RTB 3x10 -Seated RTB scap squeeze with bilat. ER. 2x10  -Standing wall slides with wash cloth. 2x10.   -Gait in // bars with increased stride length without UE support. 2X 3 laps.      PATIENT EDUCATION: Education details: Patient educated on exercises to perform at home to improve L shoulder mobility and strength with proper mechanics/positioning. Person educated: Patient and sister in-law Education method: Explanation, Demonstration, Verbal cues, and Handouts Education comprehension: returned demonstration  HOME EXERCISE PROGRAM:  Access Code: [email or web address] URL: [email or web address] Date:  01/22/2024 Prepared by: Norman Sharps  Exercises - Seated Shoulder External Rotation AAROM with Cane and Hand in Neutral  - 1 x daily - 7 x weekly - 1 sets - 15 reps - Seated Scapular Retraction  - 1 x daily - 7 x weekly - 1 sets - 15 reps - Supine Shoulder Flexion Extension AAROM with Dowel  - 1 x daily - 7 x weekly - 1 sets - 15 reps - Supine Shoulder Press with Dowel  - 1 x daily - 7 x weekly - 1 sets - 15 reps - Seated Shoulder Flexion AAROM with Dowel  - 1 x daily - 7 x weekly - 1 sets - 15 reps  GOALS: Goals reviewed with patient? Yes  SHORT TERM GOALS: Target date: 02/19/2024  Patient will be independent with HEP allowing him to perform exercises at home with proper positioning/mechanics/safety.  Baseline: HEP initiated at evaluation with handout.  Goal status: INITIAL    LONG TERM GOALS: Target date: 04/15/2024  1.  Patient (> 62 years old) will complete five times sit to stand test in < 15 seconds indicating an increased LE strength and improved balance. Baseline: Test at 1st treatment. Goal status: INITIAL  2.  Patient will improve gait mechanics showing good arm swing, foot clearance and increased stride length allowing him to ambulate more efficiently. Baseline: Decreased UE movement, lack of bilateral foot clearance during swing phase, decreased stride length, decreased gait speed. Goal status: INITIAL   3.  Patient will reduce timed up and go to <11 seconds to reduce fall risk and demonstrate improved transfer/gait ability. Baseline: 65'' without AD. Goal status: INITIAL  4.  Patient will increase 10 meter walk test to >1.91m/s as to improve gait speed for better community ambulation and to reduce fall risk. Baseline: 65'' Goal status: INITIAL  5.  Patient will improve L shouder flexion/abduction AROM to match that of the R by discharge allowing him to reach objects at greater heights such as in kitchen cabinets.  Baseline: Flexion - 108 degrees; Abduction - 90  degrees.  Goal status: INITIAL  6.  Patient will improve L shoulder strength to 4+/5 MMT by discharge allowing him to hold and carry items in L upper extremity with decreased difficulty.  Baseline: See MMT table.  Goal status: INITIAL   ASSESSMENT:  CLINICAL IMPRESSION: Patient began session with wand AAROM exercises while seated in chair to improve strength and mobility in the L shoulder with assist of R UE. He attempted L shoulder flexion AROM without wand assist, but it was too painful to perform. Patient was able to tolerate standing wall slides today at balance station which he performed well showing improved mobility in L shoulder. Red theraband utilized for L  rotator cuff and periscapular strengthening. Patient also performed gait in // bars today working on increased stride length and balance with gait. He responded well with verbal cuing and demonstration.    OBJECTIVE IMPAIRMENTS: Abnormal gait, decreased activity tolerance, decreased balance, decreased coordination, difficulty walking, decreased ROM, decreased strength, impaired UE functional use, and pain.   ACTIVITY LIMITATIONS: carrying, lifting, standing, squatting, stairs, and transfers  PARTICIPATION LIMITATIONS: meal prep, cleaning, laundry, interpersonal relationship, and yard work  PERSONAL FACTORS: Age, Past/current experiences, and 3+ comorbidities: Stroke, A -fib, PAD, HTN, hx. Of MI, COPD, blind in L eye. are also affecting patient's functional outcome.   REHAB POTENTIAL: Fair Due to patient's difficulty with communication.  CLINICAL DECISION MAKING: Evolving/moderate complexity  EVALUATION COMPLEXITY: Moderate  PLAN:  PT FREQUENCY: 1-2x/week  PT DURATION: 12 weeks  PLANNED INTERVENTIONS: [id number]- PT Re-evaluation, 97750- Physical Performance Testing, 97110-Therapeutic exercises, 97530- Therapeutic activity, [id number]- Neuromuscular re-education, 97535- Self Care, [id number]- Manual therapy, [id number]- Gait training, [phone]-  Electrical stimulation (unattended), [phone]- Electrical stimulation (manual), Patient/Family education, Balance training, Stair training, Taping, Joint mobilization, Cryotherapy, and Moist heat  PLAN FOR NEXT SESSION:  -Continue working to improve functional movement, gait, transfers, and L shoulder strength/mobility.   Norman Sharps, PT, DPT Physical Therapist - [facility]   Norman KATHEE Sharps, [place] 02/05/2024, 3:33 PM

## 2024-02-06 ENCOUNTER — Other Ambulatory Visit

## 2024-02-06 DIAGNOSIS — R972 Elevated prostate specific antigen [PSA]: Secondary | ICD-10-CM

## 2024-02-07 ENCOUNTER — Ambulatory Visit

## 2024-02-07 ENCOUNTER — Encounter: Admitting: Occupational Therapy

## 2024-02-07 ENCOUNTER — Ambulatory Visit: Admitting: Speech Pathology

## 2024-02-07 DIAGNOSIS — R278 Other lack of coordination: Secondary | ICD-10-CM

## 2024-02-07 DIAGNOSIS — R2681 Unsteadiness on feet: Secondary | ICD-10-CM

## 2024-02-07 DIAGNOSIS — R4701 Aphasia: Secondary | ICD-10-CM

## 2024-02-07 DIAGNOSIS — R41841 Cognitive communication deficit: Secondary | ICD-10-CM

## 2024-02-07 DIAGNOSIS — M6281 Muscle weakness (generalized): Secondary | ICD-10-CM

## 2024-02-07 DIAGNOSIS — M25512 Pain in left shoulder: Secondary | ICD-10-CM

## 2024-02-07 LAB — PSA: Prostate Specific Ag, Serum: 6.3 ng/mL — ABNORMAL HIGH (ref 0.0–4.0)

## 2024-02-07 NOTE — Therapy (Signed)
 OUTPATIENT SPEECH LANGUAGE PATHOLOGY  SPEECH LANGUAGE TREATMENT NOTE    Patient Name: Gregory Lane MRN: [id number] DOB:07-14-1950, 73 y.o., male 19 Date: 02/07/2024   PCP: Denyse DELENA Bathe, MD REFERRING PROVIDER: Arland Earnie Pouch, MD    End of Session - 02/07/24 1120     Visit Number 39    Number of Visits 44    Date for Recertification  02/07/24    Authorization Type [employer] [place]    Progress Note Due on Visit 40    SLP Start Time 1100    SLP Stop Time  1145    SLP Time Calculation (min) 45 min    Activity Tolerance Patient tolerated treatment well          Past Medical History:  Diagnosis Date   Anxiety    Aortic atherosclerosis 02/16/2017   Arthritis    Blind left eye    COPD (chronic obstructive pulmonary disease) (HCC)    Depression    Dysrhythmia    A-fib   GERD (gastroesophageal reflux disease)    Hypertension    Low TSH level 02/17/2017   Myocardial infarction [facility])    PAD (peripheral artery disease)    Paroxysmal atrial fibrillation (HCC)    Perforated duodenal ulcer (HCC)    Stroke (HCC)    left side is weak   Past Surgical History:  Procedure Laterality Date   BRONCHIAL BIOPSY  03/22/2022   Procedure: BRONCHIAL BIOPSIES;  Surgeon: Isadora Hose, MD;  Location: [facility] ENDOSCOPY;  Service: Pulmonary;;   BRONCHIAL NEEDLE ASPIRATION BIOPSY  03/22/2022   Procedure: BRONCHIAL NEEDLE ASPIRATION BIOPSIES;  Surgeon: Isadora Hose, MD;  Location: [facility] ENDOSCOPY;  Service: Pulmonary;;   ENDOBRONCHIAL ULTRASOUND  03/22/2022   Procedure: ENDOBRONCHIAL ULTRASOUND;  Surgeon: Isadora Hose, MD;  Location: [facility] ENDOSCOPY;  Service: Pulmonary;;   EXPLORATORY LAPAROTOMY  05/12/2015   EYE SURGERY     FRACTURE SURGERY     LAPAROTOMY N/A 05/10/2015   Procedure: EXPLORATORY LAPAROTOMY;  Surgeon: Lynda Leos, MD;  Location: [facility] OR;  Service: General;  Laterality: N/A;   LEFT HEART CATH AND CORONARY ANGIOGRAPHY N/A 05/02/2016   Procedure: Left Heart Cath  and Coronary Angiography;  Surgeon: Gordy Bergamo, MD;  Location: [facility] INVASIVE CV LAB;  Service: Cardiovascular;  Laterality: N/A;   LOWER EXTREMITY ANGIOGRAPHY Left 09/21/2021   Procedure: Lower Extremity Angiography;  Surgeon: Jama Cordella KANDICE, MD;  Location: [facility] INVASIVE CV LAB;  Service: Cardiovascular;  Laterality: Left;   Patient Active Problem List   Diagnosis Date Noted   Aphasia 12/31/2023   Arthritis of left shoulder 12/31/2023   Allergic rhinitis due to pollen 12/11/2022   Tinea cruris 12/11/2022   BPH associated with nocturia 12/11/2022   Chest pain, non-cardiac 04/11/2022   Adenocarcinoma of lower lobe of right lung (HCC) 03/28/2022   Pulmonary nodule 1 cm or greater in diameter 03/22/2022   Leg weakness, bilateral 02/09/2022   Osteopenia 10/13/2021   PAD (peripheral artery disease) 09/21/2021   GERD without esophagitis 09/21/2021   Dyslipidemia 09/21/2021   Depression 09/21/2021   Atrial flutter (HCC) 09/21/2021   Chronic obstructive pulmonary disease (COPD) (HCC) 09/21/2021   Coronary artery disease 09/21/2021   Cerebrovascular accident (CVA) due to embolism of cerebral artery (HCC) 09/06/2021   Bilateral edema of lower extremity 08/05/2020   Vasovagal syncope 08/02/2020   Annual physical exam 12/26/2019   Hyperlipidemia 12/26/2019   Abrasion of right hand 12/26/2019   Metabolic syndrome 11/11/2019   Low TSH level 02/17/2017  Atrial flutter with rapid ventricular response (HCC) 02/16/2017   Cigarette smoker 02/16/2017   COPD with acute exacerbation (HCC) 02/16/2017   Elevated lactic acid level 02/16/2017   Aortic atherosclerosis 02/16/2017   Productive cough    Hypoxia 05/24/2015   SOB (shortness of breath) 05/24/2015   COPD suggested by initial evaluation 05/17/2015   Essential hypertension 05/17/2015   Smoking 05/17/2015    ONSET DATE: 08/09/2023   REFERRING DIAG: I63.9 (ICD-10-CM) - Cerebral infarction, unspecified   THERAPY DIAG:  Aphasia  Rationale  for Evaluation and Treatment Rehabilitation  SUBJECTIVE:   PERTINENT HISTORY and DIAGNOSTIC FINDINGS: Pt is a 73 year old right handed male who [facility] ED on 08/09/2023 with stroke-like symptoms - slurred speech and difficulty answering questions and weakness to his right side extremities. CT head and subsequent CTA head and neck shows left M2 occlusion with large penumbra in the region. Pt transferred to [facility] for mechanical thrombectomy on 08/10/2023. Pt admitted at [facility] from 08/10/2023 thru 08/21/2023 with recommended for home health.   MRI brain: Remonstrated left MCA territory infarct primarily involving the parietal lobe.   Additional medical history includes: blindness left eye, A-fib on warfarin, COPD, HFpEF, HTN, current smoker, ethanol abuse, RLL adenocarcinoma s/p SBRT (2024)    PAIN:  Are you having pain? Unable to communicate  FALLS: Has patient fallen in last 6 months?  No  LIVING ENVIRONMENT: Lives with: sister-in-law Lives in: House/apartment  PLOF:  Level of assistance: Independent with ADLs, Independent with IADLs, Comment: poor health literacy, difficulty with reading and writing per family report Employment: Retired   PATIENT GOALS    per pt's sister-in-law, she hopes he will talk again  SUBJECTIVE STATEMENT: Pt pleasant, reports he didn't feel good yesterday Pt accompanied by: sister-in-law  OBJECTIVE:   TODAY'S TREATMENT:  Skilled ST treatment session targeted pt's aphasia and apraxia of speech goals. SLP facilitated session by providing the following interventions:  Conversational spontaneous language comprehension and expression targeted today during basic language based game.   Pt with continued improved social language comprehension and intermittently improved speech intelligibility during off-the-cuff comments during novel basic language game.    PATIENT EDUCATION: Education details: see above Person educated: Patient and his  sister-in-law Education method: Explanation Education comprehension: needs further education  HOME EXERCISE PROGRAM:   Supplement verbal information with single written words  GOALS:  Goals reviewed with patient? Yes  SHORT TERM GOALS: Target date: 10 sessions  Updated 12/25/2023  Updated: 11/15/2023 Updated: 10/10/2023 With maximal assistance, pt will select written word to express request for food/drink item in 7 out of 10 opportunities.  Baseline: Goal status: INITIAL: MET  2.  With maximal multimodal assistance, pt will answer yes/no questions in 8 out of 10 opportunities.  Baseline:  Goal status: INITIAL: Is able to answer using written language when choosing between written yes/no: MET  3.  With maximal multimodal assistance, pt will verbally approximate common object names in 5 out of 10 opportunities.  Baseline:  Goal status: INITIAL: progress made: MET  4. With minimal assistance, pt will point to 1 common object by function in a field of 3 with 90% accuracy.  Baseline:  Goal status: INITIAL: progress made: With minimal assistance, pt will point to 1 common object by function in a field of 3 with 80% accuracy in 3 out of 5 sessions.     5. With minimal to moderate assistance, pt will point, in sequence to 2 common objects by function in field  of 3 with 90% accuracy.   Baseline:  Goal status: INITIAL: progress made: With minimal to moderate assistance, pt will point, in sequence to 2 common objects by function in field of 3 with 80% accuracy in 3 out of 5 sessions.     6. With minimal to moderate assistance pt will verablly approximate names of basic functional objects with > 75% speech intelligiblity at the word level.    Baseline:  Goal status: INITIAL: progress made, continue targeting: With minimal to moderate assistance pt with verablly approximate names of basic functional objects with > 75% speech intelligiblity at the word level in 3 out of 5 sessions.    7.  With minimal assistance, pt will identify body parts with 90% accuracy.   Baseline:  Goal status: INITIAL: MET  8. With moderate assistance, pt will follow 1 step directions with 75% accuracy. Baseline: Goal status: INITIAL: MET  9. With minimal assistance, pt will point, in sequence, to 2 common objects by name from a field of 3 with 80% accuracy across 3 consecutive sessions.  Baseline:  Goal status: INITIAL:   LONG TERM GOALS: Target date: 11/20/2023  Updated 12/25/2023  Updated: 11/15/2023 Updated: 10/10/2023 Pt will use multimodal communication and maximal assistance to communicate basic wants and needs in 2 out of 5 opportunities per pt and family report.  Baseline:  Goal status: INITIAL: progress, continue targeting  2.  With Maximal A, patient/family will demonstrate understanding of the following concepts: aphasia, spontaneous recovery, communication vs conversation, strengths/strategies to promote success, local resources by answering multiple choice questions with 50% accuracy when provided supported conversation in order to increase patient and caregiver's participation in medical care.  Baseline:  Goal status: INITIAL: progress, continue targeting   ASSESSMENT:  CLINICAL IMPRESSION: Patient is a 73 y.o. right handed male who was seen today for a speech language treatment d/t left MCA CVA.   Pt presents with improving language impairment, now conceptualized as moderate to severe Wernicke's Aphasia (as per WAB-R, above).   Verbal Communication Pt's verbal communication continues to be fluent but increased number of islands of intelligible speech especially when commenting.   Auditory Comprehension Pt with improved auditory comprehension as evidenced by his ability to select objects in field of 4, answer basic to progressing semi-complex yes/no questions and following 1 step directions.   Written Language Pt with improved letter recognition and ability to copy written  words.   Reading Comprehension Continues to be a strength for pt. Utilized to supplement receptive language deficits.   See the above treatment note for details.   OBJECTIVE IMPAIRMENTS include expressive language, receptive language, aphasia, apraxia, and written language. These impairments are limiting patient from managing medications, managing appointments, managing finances, household responsibilities, ADLs/IADLs, and effectively communicating at home and in community. Factors affecting potential to achieve goals and functional outcome are severity of impairments, family/community support, and significantly reduced health literacy. Patient will benefit from skilled SLP services to address above impairments and improve overall function.  REHAB POTENTIAL: Good  PLAN: SLP FREQUENCY: 1-2x/week  SLP DURATION: 12 weeks  PLANNED INTERVENTIONS: Language facilitation, Cueing hierachy, Internal/external aids, Functional tasks, Multimodal communication approach, SLP instruction and feedback, Compensatory strategies, and Patient/family education    Zykee Avakian B. Rubbie, M.S., CCC-SLP, Tree Surgeon Certified Brain Injury Specialist [facility]  [facility] Rehabilitation Services Office [phone] Ascom [phone] Fax [phone]

## 2024-02-07 NOTE — Therapy (Signed)
 OUTPATIENT PHYSICAL THERAPY NEURO EVALUATION   Patient Name: Gregory Lane MRN: [id number] DOB:1950-03-31, 73 y.o., male Today's Date: 02/07/2024   PCP: Fernand Denyse LABOR, MD  REFERRING PROVIDER: Albina GORMAN Dine, MD   END OF SESSION:  PT End of Session - 02/07/24 1146     Visit Number 5    Number of Visits 25    Date for Recertification  04/15/24    PT Start Time 1146    Activity Tolerance Patient tolerated treatment well          Past Medical History:  Diagnosis Date   Anxiety    Aortic atherosclerosis 02/16/2017   Arthritis    Blind left eye    COPD (chronic obstructive pulmonary disease) (HCC)    Depression    Dysrhythmia    A-fib   GERD (gastroesophageal reflux disease)    Hypertension    Low TSH level 02/17/2017   Myocardial infarction [facility])    PAD (peripheral artery disease)    Paroxysmal atrial fibrillation (HCC)    Perforated duodenal ulcer (HCC)    Stroke (HCC)    left side is weak   Past Surgical History:  Procedure Laterality Date   BRONCHIAL BIOPSY  03/22/2022   Procedure: BRONCHIAL BIOPSIES;  Surgeon: Isadora Hose, MD;  [facility];  Service: Pulmonary;;   BRONCHIAL NEEDLE ASPIRATION BIOPSY  03/22/2022   Procedure: BRONCHIAL NEEDLE ASPIRATION BIOPSIES;  Surgeon: Isadora Hose, MD;  Location: [facility];  Service: Pulmonary;;   ENDOBRONCHIAL ULTRASOUND  03/22/2022   Procedure: ENDOBRONCHIAL ULTRASOUND;  Surgeon: Isadora Hose, MD;  Location: [facility];  Service: Pulmonary;;   EXPLORATORY LAPAROTOMY  05/12/2015   EYE SURGERY     FRACTURE SURGERY     LAPAROTOMY N/A 05/10/2015   Procedure: EXPLORATORY LAPAROTOMY;  Surgeon: Lynda Leos, MD;  Location: [facility] OR;  Service: General;  Laterality: N/A;   LEFT HEART CATH AND CORONARY ANGIOGRAPHY N/A 05/02/2016   Procedure: Left Heart Cath and Coronary Angiography;  Surgeon: Gordy Bergamo, MD;  Location: [facility] INVASIVE CV LAB;  Service: Cardiovascular;  Laterality: N/A;   LOWER EXTREMITY  ANGIOGRAPHY Left 09/21/2021   Procedure: Lower Extremity Angiography;  Surgeon: Jama Cordella KANDICE, MD;  Location: [facility] INVASIVE CV LAB;  Service: Cardiovascular;  Laterality: Left;   Patient Active Problem List   Diagnosis Date Noted   Aphasia 12/31/2023   Arthritis of left shoulder 12/31/2023   Allergic rhinitis due to pollen 12/11/2022   Tinea cruris 12/11/2022   BPH associated with nocturia 12/11/2022   Chest pain, non-cardiac 04/11/2022   Adenocarcinoma of lower lobe of right lung (HCC) 03/28/2022   Pulmonary nodule 1 cm or greater in diameter 03/22/2022   Leg weakness, bilateral 02/09/2022   Osteopenia 10/13/2021   PAD (peripheral artery disease) 09/21/2021   GERD without esophagitis 09/21/2021   Dyslipidemia 09/21/2021   Depression 09/21/2021   Atrial flutter (HCC) 09/21/2021   Chronic obstructive pulmonary disease (COPD) (HCC) 09/21/2021   Coronary artery disease 09/21/2021   Cerebrovascular accident (CVA) due to embolism of cerebral artery (HCC) 09/06/2021   Bilateral edema of lower extremity 08/05/2020   Vasovagal syncope 08/02/2020   Annual physical exam 12/26/2019   Hyperlipidemia 12/26/2019   Abrasion of right hand 12/26/2019   Metabolic syndrome 11/11/2019   Low TSH level 02/17/2017   Atrial flutter with rapid ventricular response (HCC) 02/16/2017   Cigarette smoker 02/16/2017   COPD with acute exacerbation (HCC) 02/16/2017   Elevated lactic acid level 02/16/2017   Aortic atherosclerosis 02/16/2017  Productive cough    Hypoxia 05/24/2015   SOB (shortness of breath) 05/24/2015   COPD suggested by initial evaluation 05/17/2015   Essential hypertension 05/17/2015   Smoking 05/17/2015    ONSET DATE: Unsure, a long time   REFERRING DIAG:  M19.012 (ICD-10-CM) - Arthritis of left shoulder  I63.40 (ICD-10-CM) - Cerebrovascular accident (CVA) due to embolism of cerebral artery (HCC)     THERAPY DIAG:  No diagnosis found.  Rationale for Evaluation and  Treatment: Rehabilitation  SUBJECTIVE:                                                                                                                                                                                             SUBJECTIVE STATEMENT:  02/05/2024: Pt denies falling since previous visit. He does report having moderate pain in L arm today upon arrival.    At Eval: Patient's sister in-law was present to assist with subjective portion of evaluation. Patient had CVA in June effecting R side. He did not have PT treatment following CVA initially due to focusing on speech first. Patient was noted to have damage to the Wernicke's Area causing aphasia making communication slightly difficult during today's evaluation. Patient walks without AD at home, but has decreased balance when walking. His sister in-law reports the R leg gave out last week and he fell in bedroom. He has also fallen in past when performing activities on uneven surfaces such as taking out garbage. He does do a little yard work such as picking up limbs and helping out in yard, but not much.   Patient reports that he has had L shoulder pain for a long time. Cannot say if he has had any previous injuries and does not think he has had imaging of shoulder. He expressed having sharp pain with movement of L shoulder from the shoulder to the elbow.    Pt accompanied by: Sister in-law.   PERTINENT HISTORY: CVA effecting L side in June 2025, Wernicke's aphasia, R shoulder pain with movement.   PAIN:  Are you having pain? 9/10, but has difficulty describing.   PRECAUTIONS: Fall and Other: Decreased safety awareness.  RED FLAGS: None   WEIGHT BEARING RESTRICTIONS: No  FALLS: Has patient fallen in last 6 months? Yes. Number of falls Patient is unsure.   LIVING ENVIRONMENT: Lives with: lives with their family Lives in: House/apartment Stairs: Yes: External: 4 steps; on right going up and on left going up Has following  equipment at home: None  PLOF: Independent  PATIENT GOALS: Improve L shoulder function/pain levels; Improve balance with gait/ADLs.   OBJECTIVE:  Note: Objective measures  were completed at Evaluation unless otherwise noted.  DIAGNOSTIC FINDINGS:   COMPARISON:  CT head 09/06/2021.   FINDINGS: Brain: Encephalomalacia in the anterior right temporal and frontal lobes. Smaller area of encephalomalacia in the inferior left frontal lobe. Remote lacunar infarct in the posterior limb of the left internal capsule. No evidence of acute large vascular territory, acute hemorrhage, mass lesion or midline shift.  COGNITION: Overall cognitive status: Wernicke's Aphasia    SENSATION: Not tested  COORDINATION: Decreased speed with toe taps.     POSTURE: rounded shoulders, forward head, and increased thoracic kyphosis   LOWER EXTREMITY MMT:    MMT Right Eval Left Eval  Hip flexion 4 3+  Hip extension    Hip abduction 5 5  Hip adduction    Hip internal rotation    Hip external rotation    Knee flexion 5 4+  Knee extension 5 4  Ankle dorsiflexion 5 5  Ankle plantarflexion    Ankle inversion    Ankle eversion    (Blank rows = not tested)  UPPER EXTREMITY MMT:  MMT Right eval Left eval  Shoulder flexion 5 4-  Shoulder extension    Shoulder abduction 5 4  Shoulder adduction    Shoulder extension    Shoulder internal rotation 5 5  Shoulder external rotation 5 4  Middle trapezius    Lower trapezius    Elbow flexion    Elbow extension    Wrist flexion    Wrist extension    Wrist ulnar deviation    Wrist radial deviation    Wrist pronation    Wrist supination    Grip strength     (Blank rows = not tested)   UPPER EXTREMITY ROM:  Active ROM Right eval Left eval  Shoulder flexion 155 108  Shoulder extension    Shoulder abduction 160 90  Shoulder adduction    Shoulder extension    Shoulder internal rotation [facility]  Shoulder external rotation 57 40  Elbow  flexion    Elbow extension    Wrist flexion    Wrist extension    Wrist ulnar deviation    Wrist radial deviation    Wrist pronation    Wrist supination     (Blank rows = not tested)  BED MOBILITY:  Not tested  TRANSFERS: Sit to stand: Complete Independence and CGA  Assistive device utilized: Use of one upper extremity to assist trunk lift.      RAMP:  Not tested  CURB:  Not tested  STAIRS: Not tested GAIT: Findings: Gait Characteristics: decreased arm swing- Right, decreased arm swing- Left, decreased step length- Right, decreased step length- Left, and decreased stride length, Distance walked: 50', and Comments: .  FUNCTIONAL TESTS:  5 times sit to stand: 27.7'' Timed up and go (TUG): 65'' 10 meter walk test: 45''   PATIENT SURVEYS:  Unable to perform survey due to patient's cognition.  TREATMENT DATE: 01/22/2024     -Seated shoulder AAROM wand flexion: 3x10 (attempted AROM flexion, but was too painful).  -Seated L shoulder AAROM wand ER: 2x10  -Seated RTB IR 2x10  -Seated RTB ER 3x10 -Seated Rows RTB 3x10 -Seated RTB scap squeeze with bilat. ER. 3x10  -Standing wall slides with wash cloth. 2x10.  -Ball on wall: 20x flex/ext.   Sit to stands: 2x10   -Gait x70' with CGA for safety working on increased stride length and foot clearance bilaterally.      PATIENT EDUCATION: Education details: Patient educated on exercises to perform at home to improve L shoulder mobility and strength with proper mechanics/positioning. Person educated: Patient and sister in-law Education method: Explanation, Demonstration, Verbal cues, and Handouts Education comprehension: returned demonstration  HOME EXERCISE PROGRAM:  Access Code: [email or web address] URL: [email or web address] Date: 01/22/2024 Prepared by: Norman Sharps  Exercises - Seated  Shoulder External Rotation AAROM with Cane and Hand in Neutral  - 1 x daily - 7 x weekly - 1 sets - 15 reps - Seated Scapular Retraction  - 1 x daily - 7 x weekly - 1 sets - 15 reps - Supine Shoulder Flexion Extension AAROM with Dowel  - 1 x daily - 7 x weekly - 1 sets - 15 reps - Supine Shoulder Press with Dowel  - 1 x daily - 7 x weekly - 1 sets - 15 reps - Seated Shoulder Flexion AAROM with Dowel  - 1 x daily - 7 x weekly - 1 sets - 15 reps  GOALS: Goals reviewed with patient? Yes  SHORT TERM GOALS: Target date: 02/19/2024  Patient will be independent with HEP allowing him to perform exercises at home with proper positioning/mechanics/safety.  Baseline: HEP initiated at evaluation with handout.  Goal status: INITIAL    LONG TERM GOALS: Target date: 04/15/2024  1.  Patient (> 1 years old) will complete five times sit to stand test in < 15 seconds indicating an increased LE strength and improved balance. Baseline: Test at 1st treatment. Goal status: INITIAL  2.  Patient will improve gait mechanics showing good arm swing, foot clearance and increased stride length allowing him to ambulate more efficiently. Baseline: Decreased UE movement, lack of bilateral foot clearance during swing phase, decreased stride length, decreased gait speed. Goal status: INITIAL   3.  Patient will reduce timed up and go to <11 seconds to reduce fall risk and demonstrate improved transfer/gait ability. Baseline: 65'' without AD. Goal status: INITIAL  4.  Patient will increase 10 meter walk test to >1.90m/s as to improve gait speed for better community ambulation and to reduce fall risk. Baseline: 65'' Goal status: INITIAL  5.  Patient will improve L shouder flexion/abduction AROM to match that of the R by discharge allowing him to reach objects at greater heights such as in kitchen cabinets.  Baseline: Flexion - 108 degrees; Abduction - 90 degrees.  Goal status: INITIAL  6.  Patient will improve L  shoulder strength to 4+/5 MMT by discharge allowing him to hold and carry items in L upper extremity with decreased difficulty.  Baseline: See MMT table.  Goal status: INITIAL   ASSESSMENT:  CLINICAL IMPRESSION: Patient began today's session with L shoulder AAROM using wand and shoulder strengthening with RTB. He continues to show progress with these exercises demonstrating improved L shoulder strength, mobility, and mechanics with fewer cues. He also performed standing wall slides today for improved shoulder mobility. Ball rolls on wall performed to increase stability in  L shoulder. He did require increased cuing for mechanics/positioning during ball on wall exercise. Patient finished with sit to stands and gait training for improved efficiency/mechanics with these exercises at home. Cuing given to increase stride lengths and step hight to clear bilateral feet. Overall, patient tolerated treatment better today than in previous visits as he was able to progress on existing exercises and add additional exercises today.    OBJECTIVE IMPAIRMENTS: Abnormal gait, decreased activity tolerance, decreased balance, decreased coordination, difficulty walking, decreased ROM, decreased strength, impaired UE functional use, and pain.   ACTIVITY LIMITATIONS: carrying, lifting, standing, squatting, stairs, and transfers  PARTICIPATION LIMITATIONS: meal prep, cleaning, laundry, interpersonal relationship, and yard work  PERSONAL FACTORS: Age, Past/current experiences, and 3+ comorbidities: Stroke, A -fib, PAD, HTN, hx. Of MI, COPD, blind in L eye. are also affecting patient's functional outcome.   REHAB POTENTIAL: Fair Due to patient's difficulty with communication.  CLINICAL DECISION MAKING: Evolving/moderate complexity  EVALUATION COMPLEXITY: Moderate  PLAN:  PT FREQUENCY: 1-2x/week  PT DURATION: 12 weeks  PLANNED INTERVENTIONS: 97164- PT Re-evaluation, 97750- Physical Performance Testing,  97110-Therapeutic exercises, 97530- Therapeutic activity, [id number]- Neuromuscular re-education, 97535- Self Care, [id number]- Manual therapy, [id number]- Gait training, [phone]- Electrical stimulation (unattended), [phone]- Electrical stimulation (manual), Patient/Family education, Balance training, Stair training, Taping, Joint mobilization, Cryotherapy, and Moist heat  PLAN FOR NEXT SESSION:  -Continue working to improve functional movement, gait, transfers, and L shoulder strength/mobility.  -Try supine gravity assisted exercises for increased shoulder strength/mobility.   Norman Sharps, PT, DPT Physical Therapist - [facility]   Norman KATHEE Sharps, [place] 02/07/2024, 11:47 AM

## 2024-02-08 ENCOUNTER — Ambulatory Visit (INDEPENDENT_AMBULATORY_CARE_PROVIDER_SITE_OTHER): Admitting: Urology

## 2024-02-08 ENCOUNTER — Encounter: Payer: Self-pay | Admitting: Urology

## 2024-02-08 VITALS — BP 196/82 | HR 53 | Ht 73.5 in | Wt 195.4 lb

## 2024-02-08 DIAGNOSIS — R972 Elevated prostate specific antigen [PSA]: Secondary | ICD-10-CM | POA: Diagnosis not present

## 2024-02-08 NOTE — Patient Instructions (Signed)
 Scheduling number: [phone]

## 2024-02-08 NOTE — Progress Notes (Signed)
 "  02/08/2024 10:11 AM   Shellie Goettl Lenzen 07-Jul-1950 [id number]  Referring provider: Fernand Denyse LABOR, MD [address] [place],  [place] [address]  Chief Complaint  Patient presents with   Other   Urologic history: 1.  Elevated PSA PSA 5.9 on 09/25/2023 Prostate MRI 10/31/2023: 23.5 cc volume; no lesions suspicious for high-grade prostate cancer; borderline enlarged external iliac nodes  HPI: Gregory Lane is a 73 y.o. male presents for follow-up visit.SABRA  His sister was with him today.  No problems since last visit No bothersome LUTS Denies dysuria, gross hematuria No flank, abdominal or pelvic pain PSA 02/06/2024 stable at 6.3  PSA trend    Prostate Specific Ag, Serum  Latest Ref Rng 0.0 - 4.0 ng/mL  12/11/2022 4.1  07/10/2023 5.1  09/25/2023 5.9  02/06/2024 6.3            PMH: Past Medical History:  Diagnosis Date   Anxiety    Aortic atherosclerosis 02/16/2017   Arthritis    Blind left eye    COPD (chronic obstructive pulmonary disease) (HCC)    Depression    Dysrhythmia    A-fib   GERD (gastroesophageal reflux disease)    Hypertension    Low TSH level 02/17/2017   Myocardial infarction [facility])    PAD (peripheral artery disease)    Paroxysmal atrial fibrillation (HCC)    Perforated duodenal ulcer (HCC)    Stroke (HCC)    left side is weak    Surgical History: Past Surgical History:  Procedure Laterality Date   BRONCHIAL BIOPSY  03/22/2022   Procedure: BRONCHIAL BIOPSIES;  Surgeon: Isadora Hose, MD;  [facility]: [facility];  Service: Pulmonary;;   BRONCHIAL NEEDLE ASPIRATION BIOPSY  03/22/2022   Procedure: BRONCHIAL NEEDLE ASPIRATION BIOPSIES;  Surgeon: Isadora Hose, MD;  [facility]: [facility];  Service: Pulmonary;;   ENDOBRONCHIAL ULTRASOUND  03/22/2022   Procedure: ENDOBRONCHIAL ULTRASOUND;  Surgeon: Isadora Hose, MD;  Location: [facility];  Service: Pulmonary;;   EXPLORATORY LAPAROTOMY  05/12/2015   EYE SURGERY     FRACTURE SURGERY      LAPAROTOMY N/A 05/10/2015   Procedure: EXPLORATORY LAPAROTOMY;  Surgeon: Lynda Leos, MD;  Location: [facility] OR;  Service: General;  Laterality: N/A;   LEFT HEART CATH AND CORONARY ANGIOGRAPHY N/A 05/02/2016   Procedure: Left Heart Cath and Coronary Angiography;  Surgeon: Gordy Bergamo, MD;  Location: [facility] INVASIVE CV LAB;  Service: Cardiovascular;  Laterality: N/A;   LOWER EXTREMITY ANGIOGRAPHY Left 09/21/2021   Procedure: Lower Extremity Angiography;  Surgeon: Jama Cordella MATSU, MD;  Location: [facility] INVASIVE CV LAB;  Service: Cardiovascular;  Laterality: Left;    Home Medications:  Allergies as of 02/08/2024   No Known Allergies      Medication List        Accurate as of February 08, 2024 10:11 AM. If you have any questions, ask your nurse or doctor.          apixaban  5 MG Tabs tablet Commonly known as: ELIQUIS  Take 1 tablet (5 mg total) by mouth 2 (two) times daily.   atorvastatin  40 MG tablet Commonly known as: LIPITOR Take 1 tablet (40 mg total) by mouth daily.   celecoxib  200 MG capsule Commonly known as: CELEBREX  Take 1 capsule (200 mg total) by mouth 2 (two) times daily.   cetirizine  10 MG tablet Commonly known as: ZYRTEC  Take 1 tablet (10 mg total) by mouth daily.   diclofenac  Sodium 1 % Gel Commonly known as: VOLTAREN  Apply 2 g  topically 3 (three) times daily. Apply to left shoulder   FLUoxetine  20 MG capsule Commonly known as: PROZAC  TAKE 2 CAPSULES BY MOUTH EVERY DAY   furosemide  20 MG tablet Commonly known as: LASIX  Take 1 tablet (20 mg total) by mouth daily.   losartan  50 MG tablet Commonly known as: Cozaar  Take 1 tablet (50 mg total) by mouth 2 (two) times daily.   pantoprazole  40 MG tablet Commonly known as: PROTONIX  Take 1 tablet (40 mg total) by mouth 2 (two) times daily.        Allergies: Allergies[1]  Family History: Family History  Problem Relation Age of Onset   Hypertension Mother    Dementia Mother    Cirrhosis Father    Cancer  Father    Lung cancer Sister    Hypertension Sister    Arthritis Sister    Heart murmur Sister    Arrhythmia Sister     Social History:  reports that he has been smoking cigarettes. He has a 32.5 pack-year smoking history. He has never used smokeless tobacco. He reports current alcohol use of about 60.0 standard drinks of alcohol per week. He reports that he does not use drugs.   Physical Exam: BP (!) 196/82   Pulse (!) 53   Ht 6' 1.5 (1.867 m)   Wt 195 lb 6.4 oz (88.6 kg)   BMI 25.43 kg/m   Constitutional:  Alert, No acute distress. HEENT: [place] AT Respiratory: Normal respiratory effort, no increased work of breathing. Psychiatric: Normal mood and affect.   Assessment & Plan:    1.  Elevated PSA Stable  2.  Borderline enlarged external iliac node Follow-up pelvic CT to assess for any interval change   [name], MD  [facility] [address], [address] [place], [place] [address] [phone]     [1] No Known Allergies  "

## 2024-02-11 ENCOUNTER — Encounter

## 2024-02-11 ENCOUNTER — Other Ambulatory Visit

## 2024-02-11 DIAGNOSIS — Z8673 Personal history of transient ischemic attack (TIA), and cerebral infarction without residual deficits: Secondary | ICD-10-CM

## 2024-02-11 DIAGNOSIS — I739 Peripheral vascular disease, unspecified: Secondary | ICD-10-CM

## 2024-02-11 DIAGNOSIS — I4892 Unspecified atrial flutter: Secondary | ICD-10-CM

## 2024-02-11 DIAGNOSIS — J301 Allergic rhinitis due to pollen: Secondary | ICD-10-CM

## 2024-02-11 DIAGNOSIS — R972 Elevated prostate specific antigen [PSA]: Secondary | ICD-10-CM

## 2024-02-11 DIAGNOSIS — I10 Essential (primary) hypertension: Secondary | ICD-10-CM

## 2024-02-11 DIAGNOSIS — E782 Mixed hyperlipidemia: Secondary | ICD-10-CM

## 2024-02-11 DIAGNOSIS — I700 Atherosclerosis of aorta: Secondary | ICD-10-CM

## 2024-02-11 DIAGNOSIS — K219 Gastro-esophageal reflux disease without esophagitis: Secondary | ICD-10-CM

## 2024-02-11 DIAGNOSIS — I2510 Atherosclerotic heart disease of native coronary artery without angina pectoris: Secondary | ICD-10-CM

## 2024-02-11 NOTE — Therapy (Signed)
 " OUTPATIENT PHYSICAL THERAPY NEURO TREATMENT   Patient Name: Gregory Lane MRN: [id number] DOB:[birth year], 73 y.o., male 48 Date: 02/12/2024   PCP: Fernand Denyse LABOR, MD  REFERRING PROVIDER: Albina GORMAN Dine, MD   END OF SESSION:  PT End of Session - 02/12/24 1104     Visit Number 6    Number of Visits 25    Date for Recertification  04/15/24    PT Start Time 1103    PT Stop Time 1143    PT Time Calculation (min) 40 min    Activity Tolerance Patient tolerated treatment well           Past Medical History:  Diagnosis Date   Anxiety    Aortic atherosclerosis 02/16/2017   Arthritis    Blind left eye    COPD (chronic obstructive pulmonary disease) (HCC)    Depression    Dysrhythmia    A-fib   GERD (gastroesophageal reflux disease)    Hypertension    Low TSH level 02/17/2017   Myocardial infarction [facility])    PAD (peripheral artery disease)    Paroxysmal atrial fibrillation (HCC)    Perforated duodenal ulcer (HCC)    Stroke (HCC)    left side is weak   Past Surgical History:  Procedure Laterality Date   BRONCHIAL BIOPSY  03/22/2022   Procedure: BRONCHIAL BIOPSIES;  Surgeon: Isadora Hose, MD;  Location: [facility] ENDOSCOPY;  Service: Pulmonary;;   BRONCHIAL NEEDLE ASPIRATION BIOPSY  03/22/2022   Procedure: BRONCHIAL NEEDLE ASPIRATION BIOPSIES;  Surgeon: Isadora Hose, MD;  Location: [facility]OS[facility];  Service: Pulmonary;;   ENDOBRONCHIAL ULTRASOUND  03/22/2022   Procedure: ENDOBRONCHIAL ULTRASOUND;  Surgeon: Isadora Hose, MD;  Location: [facility] ENDOSCOPY;  Service: Pulmonary;;   EXPLORATORY LAPAROTOMY  05/12/2015   EYE SURGERY     FRACTURE SURGERY     LAPAROTOMY N/A 05/10/2015   Procedure: EXPLORATORY LAPAROTOMY;  Surgeon: Lynda Leos, MD;  Location: [facility] OR;  Service: General;  Laterality: N/A;   LEFT HEART CATH AND CORONARY ANGIOGRAPHY N/A 05/02/2016   Procedure: Left Heart Cath and Coronary Angiography;  Surgeon: Gordy Bergamo, MD;  Location: [facility] INVASIVE CV LAB;   Service: Cardiovascular;  Laterality: N/A;   LOWER EXTREMITY ANGIOGRAPHY Left 09/21/2021   Procedure: Lower Extremity Angiography;  Surgeon: Jama Cordella KANDICE, MD;  Location: [facility] INVASIVE CV LAB;  Service: Cardiovascular;  Laterality: Left;   Patient Active Problem List   Diagnosis Date Noted   Aphasia 12/31/2023   Arthritis of left shoulder 12/31/2023   Allergic rhinitis due to pollen 12/11/2022   Tinea cruris 12/11/2022   BPH associated with nocturia 12/11/2022   Chest pain, non-cardiac 04/11/2022   Adenocarcinoma of lower lobe of right lung (HCC) 03/28/2022   Pulmonary nodule 1 cm or greater in diameter 03/22/2022   Leg weakness, bilateral 02/09/2022   Osteopenia 10/13/2021   PAD (peripheral artery disease) 09/21/2021   GERD without esophagitis 09/21/2021   Dyslipidemia 09/21/2021   Depression 09/21/2021   Atrial flutter (HCC) 09/21/2021   Chronic obstructive pulmonary disease (COPD) (HCC) 09/21/2021   Coronary artery disease 09/21/2021   Cerebrovascular accident (CVA) due to embolism of cerebral artery (HCC) 09/06/2021   Bilateral edema of lower extremity 08/05/2020   Vasovagal syncope 08/02/2020   Annual physical exam 12/26/2019   Hyperlipidemia 12/26/2019   Abrasion of right hand 12/26/2019   Metabolic syndrome 11/11/2019   Low TSH level 02/17/2017   Atrial flutter with rapid ventricular response (HCC) 02/16/2017   Cigarette smoker 02/16/2017   COPD  with acute exacerbation (HCC) 02/16/2017   Elevated lactic acid level 02/16/2017   Aortic atherosclerosis 02/16/2017   Productive cough    Hypoxia 05/24/2015   SOB (shortness of breath) 05/24/2015   COPD suggested by initial evaluation 05/17/2015   Essential hypertension 05/17/2015   Smoking 05/17/2015    ONSET DATE: Unsure, a long time   REFERRING DIAG:  M19.012 (ICD-10-CM) - Arthritis of left shoulder  I63.40 (ICD-10-CM) - Cerebrovascular accident (CVA) due to embolism of cerebral artery (HCC)     THERAPY DIAG:   Left shoulder pain, unspecified chronicity  Unsteadiness on feet  Other lack of coordination  Muscle weakness (generalized)  Rationale for Evaluation and Treatment: Rehabilitation  SUBJECTIVE:                                                                                                                                                                                             SUBJECTIVE STATEMENT:  02/12/2024: Pt denies falling since previous visit. He does report having moderate pain in L arm today upon arrival. Patient received heart monitor yesterday and will wear it for 14 days.    At Eval: Patient's sister in-law was present to assist with subjective portion of evaluation. Patient had CVA in June effecting R side. He did not have PT treatment following CVA initially due to focusing on speech first. Patient was noted to have damage to the Wernicke's Area causing aphasia making communication slightly difficult during today's evaluation. Patient walks without AD at home, but has decreased balance when walking. His sister in-law reports the R leg gave out last week and he fell in bedroom. He has also fallen in past when performing activities on uneven surfaces such as taking out garbage. He does do a little yard work such as picking up limbs and helping out in yard, but not much.   Patient reports that he has had L shoulder pain for a long time. Cannot say if he has had any previous injuries and does not think he has had imaging of shoulder. He expressed having sharp pain with movement of L shoulder from the shoulder to the elbow.    Pt accompanied by: Sister in-law.   PERTINENT HISTORY: CVA effecting L side in June 2025, Wernicke's aphasia, R shoulder pain with movement.   PAIN:  Are you having pain? 9/10, but has difficulty describing.   PRECAUTIONS: Fall and Other: Decreased safety awareness.  RED FLAGS: None   WEIGHT BEARING RESTRICTIONS: No  FALLS: Has patient fallen in  last 6 months? Yes. Number of falls Patient is unsure.   LIVING ENVIRONMENT: Lives with: lives with their  family Lives in: House/apartment Stairs: Yes: External: 4 steps; on right going up and on left going up Has following equipment at home: None  PLOF: Independent  PATIENT GOALS: Improve L shoulder function/pain levels; Improve balance with gait/ADLs.   OBJECTIVE:  Note: Objective measures were completed at Evaluation unless otherwise noted.  DIAGNOSTIC FINDINGS:   COMPARISON:  CT head 09/06/2021.   FINDINGS: Brain: Encephalomalacia in the anterior right temporal and frontal lobes. Smaller area of encephalomalacia in the inferior left frontal lobe. Remote lacunar infarct in the posterior limb of the left internal capsule. No evidence of acute large vascular territory, acute hemorrhage, mass lesion or midline shift.  COGNITION: Overall cognitive status: Wernicke's Aphasia    SENSATION: Not tested  COORDINATION: Decreased speed with toe taps.     POSTURE: rounded shoulders, forward head, and increased thoracic kyphosis   LOWER EXTREMITY MMT:    MMT Right Eval Left Eval  Hip flexion 4 3+  Hip extension    Hip abduction 5 5  Hip adduction    Hip internal rotation    Hip external rotation    Knee flexion 5 4+  Knee extension 5 4  Ankle dorsiflexion 5 5  Ankle plantarflexion    Ankle inversion    Ankle eversion    (Blank rows = not tested)  UPPER EXTREMITY MMT:  MMT Right eval Left eval  Shoulder flexion 5 4-  Shoulder extension    Shoulder abduction 5 4  Shoulder adduction    Shoulder extension    Shoulder internal rotation 5 5  Shoulder external rotation 5 4  Middle trapezius    Lower trapezius    Elbow flexion    Elbow extension    Wrist flexion    Wrist extension    Wrist ulnar deviation    Wrist radial deviation    Wrist pronation    Wrist supination    Grip strength     (Blank rows = not tested)   UPPER EXTREMITY ROM:  Active ROM  Right eval Left eval  Shoulder flexion 155 108  Shoulder extension    Shoulder abduction 160 90  Shoulder adduction    Shoulder extension    Shoulder internal rotation [facility]  Shoulder external rotation 57 40  Elbow flexion    Elbow extension    Wrist flexion    Wrist extension    Wrist ulnar deviation    Wrist radial deviation    Wrist pronation    Wrist supination     (Blank rows = not tested)  BED MOBILITY:  Not tested  TRANSFERS: Sit to stand: Complete Independence and CGA  Assistive device utilized: Use of one upper extremity to assist trunk lift.      RAMP:  Not tested  CURB:  Not tested  STAIRS: Not tested GAIT: Findings: Gait Characteristics: decreased arm swing- Right, decreased arm swing- Left, decreased step length- Right, decreased step length- Left, and decreased stride length, Distance walked: 50', and Comments: .  FUNCTIONAL TESTS:  5 times sit to stand: 27.7'' Timed up and go (TUG): 65'' 10 meter walk test: 45''   PATIENT SURVEYS:  Unable to perform survey due to patient's cognition.  TREATMENT DATE: 02/12/2024    -Seated shoulder AAROM wand flexion: 3x10 (attempted AROM flexion, but was too painful).  -Seated L shoulder AAROM wand ER: 2x10  -seated B horizontal abduction with RTB 3 x 10  -Seated RTB IR 2x10  -Seated RTB ER 3x10 -Seated Rows RTB 3x10 -Seated RTB scap squeeze with bilat. ER. 3x10  -Standing wall slides with wash cloth. 2x10. - into flexion for 2 sets, into square shape with L for 2 sets -Ball circles on wall: 20x clockwise/counterclockwise  Sit to stands: 2x10 with BUE support   Ambulation 25' with CGA with focus on increasing stride length and safety  PATIENT EDUCATION: Education details: Patient educated on exercises to perform at home to improve L shoulder mobility and strength with proper  mechanics/positioning. Person educated: Patient and sister in-law Education method: Explanation, Demonstration, Verbal cues, and Handouts Education comprehension: returned demonstration  HOME EXERCISE PROGRAM:  Access Code: [email or web address] URL: [email or web address] Date: 01/22/2024 Prepared by: Norman Sharps  Exercises - Seated Shoulder External Rotation AAROM with Cane and Hand in Neutral  - 1 x daily - 7 x weekly - 1 sets - 15 reps - Seated Scapular Retraction  - 1 x daily - 7 x weekly - 1 sets - 15 reps - Supine Shoulder Flexion Extension AAROM with Dowel  - 1 x daily - 7 x weekly - 1 sets - 15 reps - Supine Shoulder Press with Dowel  - 1 x daily - 7 x weekly - 1 sets - 15 reps - Seated Shoulder Flexion AAROM with Dowel  - 1 x daily - 7 x weekly - 1 sets - 15 reps  GOALS: Goals reviewed with patient? Yes  SHORT TERM GOALS: Target date: 02/19/2024  Patient will be independent with HEP allowing him to perform exercises at home with proper positioning/mechanics/safety.  Baseline: HEP initiated at evaluation with handout.  Goal status: INITIAL    LONG TERM GOALS: Target date: 04/15/2024  1.  Patient (> 29 years old) will complete five times sit to stand test in < 15 seconds indicating an increased LE strength and improved balance. Baseline: Test at 1st treatment. Goal status: INITIAL  2.  Patient will improve gait mechanics showing good arm swing, foot clearance and increased stride length allowing him to ambulate more efficiently. Baseline: Decreased UE movement, lack of bilateral foot clearance during swing phase, decreased stride length, decreased gait speed. Goal status: INITIAL   3.  Patient will reduce timed up and go to <11 seconds to reduce fall risk and demonstrate improved transfer/gait ability. Baseline: 65'' without AD. Goal status: INITIAL  4.  Patient will increase 10 meter walk test to >1.30m/s as to improve gait speed for better community ambulation and  to reduce fall risk. Baseline: 65'' Goal status: INITIAL  5.  Patient will improve L shouder flexion/abduction AROM to match that of the R by discharge allowing him to reach objects at greater heights such as in kitchen cabinets.  Baseline: Flexion - 108 degrees; Abduction - 90 degrees.  Goal status: INITIAL  6.  Patient will improve L shoulder strength to 4+/5 MMT by discharge allowing him to hold and carry items in L upper extremity with decreased difficulty.  Baseline: See MMT table.  Goal status: INITIAL   ASSESSMENT:  CLINICAL IMPRESSION:  Continued PT POC for L shoulder pain. Session focused on L shoulder AAROM and AROM along with brief quad strengthening during shoulder rest breaks. Performed all AROM exercises with good motion and less facial expressions  for pain. Ambulated to SLP office at end of session with cueing for increasing stride length.  Overall, patient tolerated treatment better today than in previous visits as he was able to progress on existing exercises and add additional exercises today.    OBJECTIVE IMPAIRMENTS: Abnormal gait, decreased activity tolerance, decreased balance, decreased coordination, difficulty walking, decreased ROM, decreased strength, impaired UE functional use, and pain.   ACTIVITY LIMITATIONS: carrying, lifting, standing, squatting, stairs, and transfers  PARTICIPATION LIMITATIONS: meal prep, cleaning, laundry, interpersonal relationship, and yard work  PERSONAL FACTORS: Age, Past/current experiences, and 3+ comorbidities: Stroke, A -fib, PAD, HTN, hx. Of MI, COPD, blind in L eye. are also affecting patient's functional outcome.   REHAB POTENTIAL: Fair Due to patient's difficulty with communication.  CLINICAL DECISION MAKING: Evolving/moderate complexity  EVALUATION COMPLEXITY: Moderate  PLAN:  PT FREQUENCY: 1-2x/week  PT DURATION: 12 weeks  PLANNED INTERVENTIONS: 97164- PT Re-evaluation, 97750- Physical Performance Testing,  97110-Therapeutic exercises, 97530- Therapeutic activity, [id number]- Neuromuscular re-education, 97535- Self Care, [id number]- Manual therapy, [id number]- Gait training, [phone]- Electrical stimulation (unattended), [phone]- Electrical stimulation (manual), Patient/Family education, Balance training, Stair training, Taping, Joint mobilization, Cryotherapy, and Moist heat  PLAN FOR NEXT SESSION:  -Continue working to improve functional movement, gait, transfers, and L shoulder strength/mobility.  -Try supine gravity assisted exercises for increased shoulder strength/mobility.    Maryanne Finder, PT, DPT Physical Therapist - [facility] Maryanne DELENA Finder, PT 02/12/2024, 11:04 AM        "

## 2024-02-12 ENCOUNTER — Ambulatory Visit

## 2024-02-12 ENCOUNTER — Ambulatory Visit: Admitting: Speech Pathology

## 2024-02-12 ENCOUNTER — Ambulatory Visit: Payer: Self-pay | Admitting: Urology

## 2024-02-12 ENCOUNTER — Encounter: Admitting: Occupational Therapy

## 2024-02-12 DIAGNOSIS — R2681 Unsteadiness on feet: Secondary | ICD-10-CM

## 2024-02-12 DIAGNOSIS — R278 Other lack of coordination: Secondary | ICD-10-CM

## 2024-02-12 DIAGNOSIS — M25512 Pain in left shoulder: Secondary | ICD-10-CM

## 2024-02-12 DIAGNOSIS — R4701 Aphasia: Secondary | ICD-10-CM | POA: Diagnosis not present

## 2024-02-12 DIAGNOSIS — M6281 Muscle weakness (generalized): Secondary | ICD-10-CM

## 2024-02-12 LAB — PSA: Prostate Specific Ag, Serum: 7.2 ng/mL — ABNORMAL HIGH (ref 0.0–4.0)

## 2024-02-12 NOTE — Therapy (Signed)
 " OUTPATIENT SPEECH LANGUAGE PATHOLOGY  SPEECH LANGUAGE TREATMENT NOTE  10th VISIT PROGRESS NOTE RE-CERTIFICATION REQUEST   Patient Name: Gregory Lane MRN: [id number] DOB:Jun 20, 1950, 73 y.o., male 89 Date: 02/12/2024   PCP: Denyse DELENA Bathe, MD REFERRING PROVIDER: Arland Earnie Pouch, MD  Speech Therapy Progress Note  Dates of Reporting Period: 12/25/2023 to 02/12/2024  Objective: Patient has been seen for 10 speech therapy sessions this reporting period targeting pt's Wernicke's Aphasia. Patient is making progress toward LTGs and STGs this reporting period. See skilled intervention, clinical impressions, and goals below for details.    End of Session - 02/12/24 1219     Visit Number 40    Number of Visits 64    Date for Recertification  05/06/24    Authorization Type [employer]    Progress Note Due on Visit 40    SLP Start Time 1145    SLP Stop Time  1225    SLP Time Calculation (min) 40 min    Activity Tolerance Patient tolerated treatment well          Past Medical History:  Diagnosis Date   Anxiety    Aortic atherosclerosis 02/16/2017   Arthritis    Blind left eye    COPD (chronic obstructive pulmonary disease) (HCC)    Depression    Dysrhythmia    A-fib   GERD (gastroesophageal reflux disease)    Hypertension    Low TSH level 02/17/2017   Myocardial infarction [facility])    PAD (peripheral artery disease)    Paroxysmal atrial fibrillation (HCC)    Perforated duodenal ulcer (HCC)    Stroke (HCC)    left side is weak   Past Surgical History:  Procedure Laterality Date   BRONCHIAL BIOPSY  03/22/2022   Procedure: BRONCHIAL BIOPSIES;  Surgeon: Isadora Hose, MD;  Location: [facility];  Service: Pulmonary;;   BRONCHIAL NEEDLE ASPIRATION BIOPSY  03/22/2022   Procedure: BRONCHIAL NEEDLE ASPIRATION BIOPSIES;  Surgeon: Isadora Hose, MD;  Location: [facility];  Service: Pulmonary;;   ENDOBRONCHIAL ULTRASOUND  03/22/2022   Procedure:  ENDOBRONCHIAL ULTRASOUND;  Surgeon: Isadora Hose, MD;  Location: [facility];  Service: Pulmonary;;   EXPLORATORY LAPAROTOMY  05/12/2015   EYE SURGERY     FRACTURE SURGERY     LAPAROTOMY N/A 05/10/2015   Procedure: EXPLORATORY LAPAROTOMY;  Surgeon: Lynda Leos, MD;  Location: [facility] OR;  Service: General;  Laterality: N/A;   LEFT HEART CATH AND CORONARY ANGIOGRAPHY N/A 05/02/2016   Procedure: Left Heart Cath and Coronary Angiography;  Surgeon: Gordy Bergamo, MD;  Location: [facility] INVASIVE CV LAB;  Service: Cardiovascular;  Laterality: N/A;   LOWER EXTREMITY ANGIOGRAPHY Left 09/21/2021   Procedure: Lower Extremity Angiography;  Surgeon: Jama Cordella KANDICE, MD;  Location: [facility] INVASIVE CV LAB;  Service: Cardiovascular;  Laterality: Left;   Patient Active Problem List   Diagnosis Date Noted   Aphasia 12/31/2023   Arthritis of left shoulder 12/31/2023   Allergic rhinitis due to pollen 12/11/2022   Tinea cruris 12/11/2022   BPH associated with nocturia 12/11/2022   Chest pain, non-cardiac 04/11/2022   Adenocarcinoma of lower lobe of right lung (HCC) 03/28/2022   Pulmonary nodule 1 cm or greater in diameter 03/22/2022   Leg weakness, bilateral 02/09/2022   Osteopenia 10/13/2021   PAD (peripheral artery disease) 09/21/2021   GERD without esophagitis 09/21/2021   Dyslipidemia 09/21/2021   Depression 09/21/2021   Atrial flutter (HCC) 09/21/2021   Chronic obstructive pulmonary disease (COPD) (HCC) 09/21/2021  Coronary artery disease 09/21/2021   Cerebrovascular accident (CVA) due to embolism of cerebral artery (HCC) 09/06/2021   Bilateral edema of lower extremity 08/05/2020   Vasovagal syncope 08/02/2020   Annual physical exam 12/26/2019   Hyperlipidemia 12/26/2019   Abrasion of right hand 12/26/2019   Metabolic syndrome 11/11/2019   Low TSH level 02/17/2017   Atrial flutter with rapid ventricular response (HCC) 02/16/2017   Cigarette smoker 02/16/2017   COPD with acute exacerbation (HCC)  02/16/2017   Elevated lactic acid level 02/16/2017   Aortic atherosclerosis 02/16/2017   Productive cough    Hypoxia 05/24/2015   SOB (shortness of breath) 05/24/2015   COPD suggested by initial evaluation 05/17/2015   Essential hypertension 05/17/2015   Smoking 05/17/2015    ONSET DATE: 08/09/2023   REFERRING DIAG: I63.9 (ICD-10-CM) - Cerebral infarction, unspecified   THERAPY DIAG:  Aphasia  Rationale for Evaluation and Treatment Rehabilitation  SUBJECTIVE:   PERTINENT HISTORY and DIAGNOSTIC FINDINGS: Pt is a 72 year old right handed male who [facility] ED on 08/09/2023 with stroke-like symptoms - slurred speech and difficulty answering questions and weakness to his right side extremities. CT head and subsequent CTA head and neck shows left M2 occlusion with large penumbra in the region. Pt transferred to [facility] for mechanical thrombectomy on 08/10/2023. Pt admitted at [facility] from 08/10/2023 thru 08/21/2023 with recommended for home health.   MRI brain: Remonstrated left MCA territory infarct primarily involving the parietal lobe.   Additional medical history includes: blindness left eye, A-fib on warfarin, COPD, HFpEF, HTN, current smoker, ethanol abuse, RLL adenocarcinoma s/p SBRT (2024)    PAIN:  Are you having pain? Unable to communicate  FALLS: Has patient fallen in last 6 months?  No  LIVING ENVIRONMENT: Lives with: sister-in-law Lives in: House/apartment  PLOF:  Level of assistance: Independent with ADLs, Independent with IADLs, Comment: poor health literacy, difficulty with reading and writing per family report Employment: Retired   PATIENT GOALS    per pt's sister-in-law, she hopes he will talk again  SUBJECTIVE STATEMENT: Pt pleasant, reports he didn't feel good yesterday Pt accompanied by: sister-in-law  OBJECTIVE:   TODAY'S TREATMENT:  Skilled ST treatment session targeted pt's aphasia and apraxia of speech goals. SLP facilitated session by  providing the following interventions:  Conversational spontaneous language comprehension and expression targeted today during basic language based game. Pt benefited from Min A verbal cues to count with no awareness of verbal errors when given verbal model.    Pt with continued improved social language comprehension and intermittently improved speech intelligibility during off-the-cuff comments during novel basic language game.    PATIENT EDUCATION: Education details: see above Person educated: Patient and his sister-in-law Education method: Explanation Education comprehension: needs further education  HOME EXERCISE PROGRAM:   Supplement verbal information with single written words  GOALS:  Goals reviewed with patient? Yes  SHORT TERM GOALS: Target date: 10 sessions Updated 02/12/2024  Updated 12/25/2023   4. With minimal assistance, pt will point to 1 common object by function in a field of 3 with 90% accuracy.  Baseline:  Goal status: INITIAL: progress made: With minimal assistance, pt will point to 1 common object by function in a field of 3 with 80% accuracy in 3 out of 5 sessions.   MET  5. With minimal to moderate assistance, pt will point, in sequence to 2 common objects by function in field of 3 with 90% accuracy.   Baseline:  Goal status: INITIAL: progress made:  With minimal to moderate assistance, pt will point, in sequence to 2 common objects by function in field of 3 with 80% accuracy in 3 out of 5 sessions.   MET  6. With minimal to moderate assistance pt will verablly approximate names of basic functional objects with > 75% speech intelligiblity at the word level.    Baseline:  Goal status: INITIAL: progress made, continue targeting: With minimal to moderate assistance pt with verablly approximate names of basic functional objects with > 75% speech intelligiblity at the word level in 3 out of 5 sessions.  PROGRESS MADE  9. With minimal assistance, pt will point, in  sequence, to 2 common objects by name from a field of 3 with 80% accuracy across 3 consecutive sessions.  Baseline:  Goal status: INITIAL: PROGRESS MADE  LONG TERM GOALS: Target date: 05/06/2024  Updated: 02/12/2024 Updated 12/25/2023  1. Pt will use multimodal communication and maximal assistance to communicate basic wants and needs in 2 out of 5 opportunities per pt and family report.  Baseline:  Goal status: INITIAL: progress, continue targeting  2.  With Maximal A, patient/family will demonstrate understanding of the following concepts: aphasia, spontaneous recovery, communication vs conversation, strengths/strategies to promote success, local resources by answering multiple choice questions with 50% accuracy when provided supported conversation in order to increase patient and caregiver's participation in medical care.  Baseline:  Goal status: INITIAL: progress, continue targeting   ASSESSMENT:  CLINICAL IMPRESSION: Patient is a 73 y.o. right handed male who was seen today for a speech language treatment d/t left MCA CVA.   Pt presents with improving language impairment, now conceptualized as moderate to severe Wernicke's Aphasia (as per WAB-R, above).   Verbal Communication Pt's verbal communication continues to be fluent but increased number of islands of intelligible speech especially when commenting.   Auditory Comprehension Pt with improved auditory comprehension as evidenced by his ability to select objects in field of 4, answer basic to progressing semi-complex yes/no questions and following 1 step directions.   Written Language Pt with improved letter recognition and ability to copy written words.   Reading Comprehension Continues to be a strength for pt. Utilized to supplement receptive language deficits.   See the above treatment note for details.   OBJECTIVE IMPAIRMENTS include expressive language, receptive language, aphasia, apraxia, and written language. These  impairments are limiting patient from managing medications, managing appointments, managing finances, household responsibilities, ADLs/IADLs, and effectively communicating at home and in community. Factors affecting potential to achieve goals and functional outcome are severity of impairments, family/community support, and significantly reduced health literacy. Patient will benefit from skilled SLP services to address above impairments and improve overall function.  REHAB POTENTIAL: Good  PLAN: SLP FREQUENCY: 1-2x/week  SLP DURATION: 12 weeks  PLANNED INTERVENTIONS: Language facilitation, Cueing hierachy, Internal/external aids, Functional tasks, Multimodal communication approach, SLP instruction and feedback, Compensatory strategies, and Patient/family education    Hussam Muniz B. Rubbie, M.S., CCC-SLP, CBIS Speech-Language Pathologist Certified Brain Injury Specialist [facility]  [facility] [phone] Ascom [phone] Fax [phone]   "

## 2024-02-16 ENCOUNTER — Encounter: Payer: Self-pay | Admitting: Urology

## 2024-02-19 ENCOUNTER — Ambulatory Visit: Admitting: Speech Pathology

## 2024-02-19 ENCOUNTER — Ambulatory Visit: Admitting: Physical Therapy

## 2024-02-19 ENCOUNTER — Encounter: Admitting: Occupational Therapy

## 2024-02-19 DIAGNOSIS — R2681 Unsteadiness on feet: Secondary | ICD-10-CM

## 2024-02-19 DIAGNOSIS — M25512 Pain in left shoulder: Secondary | ICD-10-CM

## 2024-02-19 DIAGNOSIS — R4701 Aphasia: Secondary | ICD-10-CM

## 2024-02-19 DIAGNOSIS — M6281 Muscle weakness (generalized): Secondary | ICD-10-CM

## 2024-02-19 NOTE — Therapy (Signed)
 " OUTPATIENT PHYSICAL THERAPY NEURO TREATMENT   Patient Name: Gregory Lane MRN: [id number] DOB:19-Sep-1950, 73 y.o., male 26 Date: 02/19/2024   PCP: Fernand Denyse LABOR, MD  REFERRING PROVIDER: Albina GORMAN Dine, MD   END OF SESSION:  PT End of Session - 02/19/24 1313     Visit Number 7    Number of Visits 25    Date for Recertification  04/15/24    PT Start Time 1104    PT Stop Time 1143    PT Time Calculation (min) 39 min    Equipment Utilized During Treatment Gait belt    Activity Tolerance Patient tolerated treatment well    Behavior During Therapy [facility] for tasks assessed/performed            Past Medical History:  Diagnosis Date   Anxiety    Aortic atherosclerosis 02/16/2017   Arthritis    Blind left eye    COPD (chronic obstructive pulmonary disease) (HCC)    Depression    Dysrhythmia    A-fib   GERD (gastroesophageal reflux disease)    Hypertension    Low TSH level 02/17/2017   Myocardial infarction [facility])    PAD (peripheral artery disease)    Paroxysmal atrial fibrillation (HCC)    Perforated duodenal ulcer (HCC)    Stroke (HCC)    left side is weak   Past Surgical History:  Procedure Laterality Date   BRONCHIAL BIOPSY  03/22/2022   Procedure: BRONCHIAL BIOPSIES;  Surgeon: Isadora Hose, MD;  Location: [facility] ENDOSCOPY;  Service: Pulmonary;;   BRONCHIAL NEEDLE ASPIRATION BIOPSY  03/22/2022   Procedure: BRONCHIAL NEEDLE ASPIRATION BIOPSIES;  Surgeon: Isadora Hose, MD;  Location: [facility] ENDOSCOPY;  Service: Pulmonary;;   ENDOBRONCHIAL ULTRASOUND  03/22/2022   Procedure: ENDOBRONCHIAL ULTRASOUND;  Surgeon: Isadora Hose, MD;  Location: [facility] ENDOSCOPY;  Service: Pulmonary;;   EXPLORATORY LAPAROTOMY  05/12/2015   EYE SURGERY     FRACTURE SURGERY     LAPAROTOMY N/A 05/10/2015   Procedure: EXPLORATORY LAPAROTOMY;  Surgeon: Lynda Leos, MD;  Location: [facility] OR;  Service: General;  Laterality: N/A;   LEFT HEART CATH AND CORONARY ANGIOGRAPHY N/A 05/02/2016    Procedure: Left Heart Cath and Coronary Angiography;  Surgeon: Gordy Bergamo, MD;  Location: [facility] INVASIVE CV LAB;  Service: Cardiovascular;  Laterality: N/A;   LOWER EXTREMITY ANGIOGRAPHY Left 09/21/2021   Procedure: Lower Extremity Angiography;  Surgeon: Jama Cordella KANDICE, MD;  Location: [facility] INVASIVE CV LAB;  Service: Cardiovascular;  Laterality: Left;   Patient Active Problem List   Diagnosis Date Noted   Aphasia 12/31/2023   Arthritis of left shoulder 12/31/2023   Allergic rhinitis due to pollen 12/11/2022   Tinea cruris 12/11/2022   BPH associated with nocturia 12/11/2022   Chest pain, non-cardiac 04/11/2022   Adenocarcinoma of lower lobe of right lung (HCC) 03/28/2022   Pulmonary nodule 1 cm or greater in diameter 03/22/2022   Leg weakness, bilateral 02/09/2022   Osteopenia 10/13/2021   PAD (peripheral artery disease) 09/21/2021   GERD without esophagitis 09/21/2021   Dyslipidemia 09/21/2021   Depression 09/21/2021   Atrial flutter (HCC) 09/21/2021   Chronic obstructive pulmonary disease (COPD) (HCC) 09/21/2021   Coronary artery disease 09/21/2021   Cerebrovascular accident (CVA) due to embolism of cerebral artery (HCC) 09/06/2021   Bilateral edema of lower extremity 08/05/2020   Vasovagal syncope 08/02/2020   Annual physical exam 12/26/2019   Hyperlipidemia 12/26/2019   Abrasion of right hand 12/26/2019   Metabolic syndrome 11/11/2019   Low TSH  level 02/17/2017   Atrial flutter with rapid ventricular response (HCC) 02/16/2017   Cigarette smoker 02/16/2017   COPD with acute exacerbation (HCC) 02/16/2017   Elevated lactic acid level 02/16/2017   Aortic atherosclerosis 02/16/2017   Productive cough    Hypoxia 05/24/2015   SOB (shortness of breath) 05/24/2015   COPD suggested by initial evaluation 05/17/2015   Essential hypertension 05/17/2015   Smoking 05/17/2015    ONSET DATE: Unsure, a long time   REFERRING DIAG:  M19.012 (ICD-10-CM) - Arthritis of left shoulder   I63.40 (ICD-10-CM) - Cerebrovascular accident (CVA) due to embolism of cerebral artery (HCC)     THERAPY DIAG:  Unsteadiness on feet  Muscle weakness (generalized)  Left shoulder pain, unspecified chronicity  Rationale for Evaluation and Treatment: Rehabilitation  SUBJECTIVE:                                                                                                                                                                                             SUBJECTIVE STATEMENT:  Pt caregiver reports no significant changes. Has been working on the shoulder exercises.    At Eval: Patient's sister in-law was present to assist with subjective portion of evaluation. Patient had CVA in June effecting R side. He did not have PT treatment following CVA initially due to focusing on speech first. Patient was noted to have damage to the Wernicke's Area causing aphasia making communication slightly difficult during today's evaluation. Patient walks without AD at home, but has decreased balance when walking. His sister in-law reports the R leg gave out last week and he fell in bedroom. He has also fallen in past when performing activities on uneven surfaces such as taking out garbage. He does do a little yard work such as picking up limbs and helping out in yard, but not much.   Patient reports that he has had L shoulder pain for a long time. Cannot say if he has had any previous injuries and does not think he has had imaging of shoulder. He expressed having sharp pain with movement of L shoulder from the shoulder to the elbow.    Pt accompanied by: Sister in-law.   PERTINENT HISTORY: CVA effecting L side in June 2025, Wernicke's aphasia, R shoulder pain with movement.   PAIN:  Are you having pain? 9/10, but has difficulty describing.   PRECAUTIONS: Fall and Other: Decreased safety awareness.  RED FLAGS: None   WEIGHT BEARING RESTRICTIONS: No  FALLS: Has patient fallen in last 6  months? Yes. Number of falls Patient is unsure.   LIVING ENVIRONMENT: Lives with: lives with their family Lives in:  House/apartment Stairs: Yes: External: 4 steps; on right going up and on left going up Has following equipment at home: None  PLOF: Independent  PATIENT GOALS: Improve L shoulder function/pain levels; Improve balance with gait/ADLs.   OBJECTIVE:  Note: Objective measures were completed at Evaluation unless otherwise noted.  DIAGNOSTIC FINDINGS:   COMPARISON:  CT head 09/06/2021.   FINDINGS: Brain: Encephalomalacia in the anterior right temporal and frontal lobes. Smaller area of encephalomalacia in the inferior left frontal lobe. Remote lacunar infarct in the posterior limb of the left internal capsule. No evidence of acute large vascular territory, acute hemorrhage, mass lesion or midline shift.  COGNITION: Overall cognitive status: Wernicke's Aphasia    SENSATION: Not tested  COORDINATION: Decreased speed with toe taps.     POSTURE: rounded shoulders, forward head, and increased thoracic kyphosis   LOWER EXTREMITY MMT:    MMT Right Eval Left Eval  Hip flexion 4 3+  Hip extension    Hip abduction 5 5  Hip adduction    Hip internal rotation    Hip external rotation    Knee flexion 5 4+  Knee extension 5 4  Ankle dorsiflexion 5 5  Ankle plantarflexion    Ankle inversion    Ankle eversion    (Blank rows = not tested)  UPPER EXTREMITY MMT:  MMT Right eval Left eval  Shoulder flexion 5 4-  Shoulder extension    Shoulder abduction 5 4  Shoulder adduction    Shoulder extension    Shoulder internal rotation 5 5  Shoulder external rotation 5 4  Middle trapezius    Lower trapezius    Elbow flexion    Elbow extension    Wrist flexion    Wrist extension    Wrist ulnar deviation    Wrist radial deviation    Wrist pronation    Wrist supination    Grip strength     (Blank rows = not tested)   UPPER EXTREMITY ROM:  Active ROM  Right eval Left eval  Shoulder flexion 155 108  Shoulder extension    Shoulder abduction 160 90  Shoulder adduction    Shoulder extension    Shoulder internal rotation [facility]  Shoulder external rotation 57 40  Elbow flexion    Elbow extension    Wrist flexion    Wrist extension    Wrist ulnar deviation    Wrist radial deviation    Wrist pronation    Wrist supination     (Blank rows = not tested)  BED MOBILITY:  Not tested  TRANSFERS: Sit to stand: Complete Independence and CGA  Assistive device utilized: Use of one upper extremity to assist trunk lift.      RAMP:  Not tested  CURB:  Not tested  STAIRS: Not tested GAIT: Findings: Gait Characteristics: decreased arm swing- Right, decreased arm swing- Left, decreased step length- Right, decreased step length- Left, and decreased stride length, Distance walked: 50', and Comments: .  FUNCTIONAL TESTS:  5 times sit to stand: 27.7'' Timed up and go (TUG): 65'' 10 meter walk test: 45''   PATIENT SURVEYS:  Unable to perform survey due to patient's cognition.  TREATMENT DATE: 02/19/2024    TE- To improve strength, endurance, mobility, and function of specific targeted muscle groups or improve joint range of motion or improve muscle flexibility  -Seated shoulder AAROM wand flexion: 3x10  -Seated L shoulder AAROM wand ER: 2x10 - cues for proper plane of movement   -seated B horizontal abduction with RTB 3 x 10  -Seated RTB B ER 3x10- cues for elbow position -Seated Rows GTB 3x10- cues for shoulder   Sit to stands: 2x10 with BUE support - gait with cga x 15 ft to [facility] after last STS.   PATIENT EDUCATION: Education details: Patient educated on exercises to perform at home to improve L shoulder mobility and strength with proper mechanics/positioning. Person educated: Patient and sister  in-law Education method: Explanation, Demonstration, Verbal cues, and Handouts Education comprehension: returned demonstration  HOME EXERCISE PROGRAM:  Access Code: [email or web address] URL: [email or web address] Date: 01/22/2024 Prepared by: Norman Sharps  Exercises - Seated Shoulder External Rotation AAROM with Cane and Hand in Neutral  - 1 x daily - 7 x weekly - 1 sets - 15 reps - Seated Scapular Retraction  - 1 x daily - 7 x weekly - 1 sets - 15 reps - Supine Shoulder Flexion Extension AAROM with Dowel  - 1 x daily - 7 x weekly - 1 sets - 15 reps - Supine Shoulder Press with Dowel  - 1 x daily - 7 x weekly - 1 sets - 15 reps - Seated Shoulder Flexion AAROM with Dowel  - 1 x daily - 7 x weekly - 1 sets - 15 reps  GOALS: Goals reviewed with patient? Yes  SHORT TERM GOALS: Target date: 02/19/2024  Patient will be independent with HEP allowing him to perform exercises at home with proper positioning/mechanics/safety.  Baseline: HEP initiated at evaluation with handout.  Goal status: INITIAL    LONG TERM GOALS: Target date: 04/15/2024  1.  Patient (> 29 years old) will complete five times sit to stand test in < 15 seconds indicating an increased LE strength and improved balance. Baseline: Test at 1st treatment. Goal status: INITIAL  2.  Patient will improve gait mechanics showing good arm swing, foot clearance and increased stride length allowing him to ambulate more efficiently. Baseline: Decreased UE movement, lack of bilateral foot clearance during swing phase, decreased stride length, decreased gait speed. Goal status: INITIAL   3.  Patient will reduce timed up and go to <11 seconds to reduce fall risk and demonstrate improved transfer/gait ability. Baseline: 65'' without AD. Goal status: INITIAL  4.  Patient will increase 10 meter walk test to >1.67m/s as to improve gait speed for better community ambulation and to reduce fall risk. Baseline: 65'' Goal status:  INITIAL  5.  Patient will improve L shouder flexion/abduction AROM to match that of the R by discharge allowing him to reach objects at greater heights such as in kitchen cabinets.  Baseline: Flexion - 108 degrees; Abduction - 90 degrees.  Goal status: INITIAL  6.  Patient will improve L shoulder strength to 4+/5 MMT by discharge allowing him to hold and carry items in L upper extremity with decreased difficulty.  Baseline: See MMT table.  Goal status: INITIAL   ASSESSMENT:  CLINICAL IMPRESSION:   Continued PT POC for L shoulder pain. Session focused on L shoulder AAROM and AROM along with brief LE functional strength at end of session. Performed all exercise with min expressions on face for pain. Pt did have trouble following instruction for  proper form but does alter and attempts to change form in accordance with PT instruction.  Overall, patient tolerated treatment better today than in previous visits as he was able to progress on existing exercises and add additional exercises today.   OBJECTIVE IMPAIRMENTS: Abnormal gait, decreased activity tolerance, decreased balance, decreased coordination, difficulty walking, decreased ROM, decreased strength, impaired UE functional use, and pain.   ACTIVITY LIMITATIONS: carrying, lifting, standing, squatting, stairs, and transfers  PARTICIPATION LIMITATIONS: meal prep, cleaning, laundry, interpersonal relationship, and yard work  PERSONAL FACTORS: Age, Past/current experiences, and 3+ comorbidities: Stroke, A -fib, PAD, HTN, hx. Of MI, COPD, blind in L eye. are also affecting patient's functional outcome.   REHAB POTENTIAL: Fair Due to patient's difficulty with communication.  CLINICAL DECISION MAKING: Evolving/moderate complexity  EVALUATION COMPLEXITY: Moderate  PLAN:  PT FREQUENCY: 1-2x/week  PT DURATION: 12 weeks  PLANNED INTERVENTIONS: 97164- PT Re-evaluation, 97750- Physical Performance Testing, 97110-Therapeutic exercises, 97530-  Therapeutic activity, [id number]- Neuromuscular re-education, 97535- Self Care, [id number]- Manual therapy, [id number]- Gait training, [phone]- Electrical stimulation (unattended), [phone]- Electrical stimulation (manual), Patient/Family education, Balance training, Stair training, Taping, Joint mobilization, Cryotherapy, and Moist heat  PLAN FOR NEXT SESSION:  -Continue working to improve functional movement, gait, transfers, and L shoulder strength/mobility.  -Try supine gravity assisted exercises for increased shoulder strength/mobility.    Lonni KATHEE Gainer, PT 02/19/2024, 1:14 PM        "

## 2024-02-19 NOTE — Therapy (Signed)
 " OUTPATIENT SPEECH LANGUAGE PATHOLOGY  SPEECH LANGUAGE TREATMENT NOTE    Patient Name: Gregory Lane MRN: [id number] DOB:03/16/1950, 73 y.o., male 27 Date: 02/19/2024   PCP: Denyse DELENA Bathe, MD REFERRING PROVIDER: Arland Earnie Pouch, MD     End of Session - 02/19/24 1138     Visit Number 41    Number of Visits 64    Date for Recertification  05/06/24    Authorization Type [employer] [place]    Progress Note Due on Visit 50    SLP Start Time 1145    SLP Stop Time  1230    SLP Time Calculation (min) 45 min    Activity Tolerance Patient tolerated treatment well          Past Medical History:  Diagnosis Date   Anxiety    Aortic atherosclerosis 02/16/2017   Arthritis    Blind left eye    COPD (chronic obstructive pulmonary disease) (HCC)    Depression    Dysrhythmia    A-fib   GERD (gastroesophageal reflux disease)    Hypertension    Low TSH level 02/17/2017   Myocardial infarction [facility])    PAD (peripheral artery disease)    Paroxysmal atrial fibrillation (HCC)    Perforated duodenal ulcer (HCC)    Stroke (HCC)    left side is weak   Past Surgical History:  Procedure Laterality Date   BRONCHIAL BIOPSY  03/22/2022   Procedure: BRONCHIAL BIOPSIES;  Surgeon: Isadora Hose, MD;  [facility]: [facility];  Service: Pulmonary;;   BRONCHIAL NEEDLE ASPIRATION BIOPSY  03/22/2022   Procedure: BRONCHIAL NEEDLE ASPIRATION BIOPSIES;  Surgeon: Isadora Hose, MD;  [facility]: [facility];  Service: Pulmonary;;   ENDOBRONCHIAL ULTRASOUND  03/22/2022   Procedure: ENDOBRONCHIAL ULTRASOUND;  Surgeon: Isadora Hose, MD;  Location: [facility];  Service: Pulmonary;;   EXPLORATORY LAPAROTOMY  05/12/2015   EYE SURGERY     FRACTURE SURGERY     LAPAROTOMY N/A 05/10/2015   Procedure: EXPLORATORY LAPAROTOMY;  Surgeon: Lynda Leos, MD;  Location: [facility] OR;  Service: General;  Laterality: N/A;   LEFT HEART CATH AND CORONARY ANGIOGRAPHY N/A 05/02/2016   Procedure: Left Heart Cath  and Coronary Angiography;  Surgeon: Gordy Bergamo, MD;  Location: [facility] INVASIVE CV LAB;  Service: Cardiovascular;  Laterality: N/A;   LOWER EXTREMITY ANGIOGRAPHY Left 09/21/2021   Procedure: Lower Extremity Angiography;  Surgeon: Jama Cordella KANDICE, MD;  Location: [facility] INVASIVE CV LAB;  Service: Cardiovascular;  Laterality: Left;   Patient Active Problem List   Diagnosis Date Noted   Aphasia 12/31/2023   Arthritis of left shoulder 12/31/2023   Allergic rhinitis due to pollen 12/11/2022   Tinea cruris 12/11/2022   BPH associated with nocturia 12/11/2022   Chest pain, non-cardiac 04/11/2022   Adenocarcinoma of lower lobe of right lung (HCC) 03/28/2022   Pulmonary nodule 1 cm or greater in diameter 03/22/2022   Leg weakness, bilateral 02/09/2022   Osteopenia 10/13/2021   PAD (peripheral artery disease) 09/21/2021   GERD without esophagitis 09/21/2021   Dyslipidemia 09/21/2021   Depression 09/21/2021   Atrial flutter (HCC) 09/21/2021   Chronic obstructive pulmonary disease (COPD) (HCC) 09/21/2021   Coronary artery disease 09/21/2021   Cerebrovascular accident (CVA) due to embolism of cerebral artery (HCC) 09/06/2021   Bilateral edema of lower extremity 08/05/2020   Vasovagal syncope 08/02/2020   Annual physical exam 12/26/2019   Hyperlipidemia 12/26/2019   Abrasion of right hand 12/26/2019   Metabolic syndrome 11/11/2019   Low TSH level  02/17/2017   Atrial flutter with rapid ventricular response (HCC) 02/16/2017   Cigarette smoker 02/16/2017   COPD with acute exacerbation (HCC) 02/16/2017   Elevated lactic acid level 02/16/2017   Aortic atherosclerosis 02/16/2017   Productive cough    Hypoxia 05/24/2015   SOB (shortness of breath) 05/24/2015   COPD suggested by initial evaluation 05/17/2015   Essential hypertension 05/17/2015   Smoking 05/17/2015    ONSET DATE: 08/09/2023   REFERRING DIAG: I63.9 (ICD-10-CM) - Cerebral infarction, unspecified   THERAPY DIAG:  Aphasia  Rationale  for Evaluation and Treatment Rehabilitation  SUBJECTIVE:   PERTINENT HISTORY and DIAGNOSTIC FINDINGS: Pt is a 73 year old right handed male who presented to [facility] ED on 08/09/2023 with stroke-like symptoms - slurred speech and difficulty answering questions and weakness to his right side extremities. CT head and subsequent CTA head and neck shows left M2 occlusion with large penumbra in the region. Pt transferred to [facility] for mechanical thrombectomy on 08/10/2023. Pt admitted at [facility] from 08/10/2023 thru 08/21/2023 with recommended for home health.   MRI brain: Remonstrated left MCA territory infarct primarily involving the parietal lobe.   Additional medical history includes: blindness left eye, A-fib on warfarin, COPD, HFpEF, HTN, current smoker, ethanol abuse, RLL adenocarcinoma s/p SBRT (2024)    PAIN:  Are you having pain? Unable to communicate  FALLS: Has patient fallen in last 6 months?  No  LIVING ENVIRONMENT: Lives with: sister-in-law Lives in: House/apartment  PLOF:  Level of assistance: Independent with ADLs, Independent with IADLs, Comment: poor health literacy, difficulty with reading and writing per family report Employment: Retired   PATIENT GOALS    per pt's sister-in-law, she hopes he will talk again  SUBJECTIVE STATEMENT: Pt pleasant,  Pt accompanied by: sister-in-law  OBJECTIVE:   TODAY'S TREATMENT:  Skilled ST treatment session targeted pt's aphasia and apraxia of speech goals. SLP facilitated session by providing the following interventions:  Pt's sister-in-law reports that pt is walking more and taking out the garbage more as a result of participating in PT.   Talk Path therapy app utilized to target receptive language.   Word Id: Level 1 - 17 out of 20; Level 2: increased verbal cues for repetition required d/t 2 word descriptions (tiny dog) to achieve 18 out of 20 Answering Questions: Level 1 19 out of 20; Level 2 - 16 out of 20 with moderate verbal  cues d/t more abstract language  Pt's sister-in-law demonstrated potential increased insight when she stated that has too many words for him to understand.  She also reports that pt is using more pointing and showing of the the items/concepts that he is trying to talk about but difficulty arises when the item/concept isn't readily present.    PATIENT EDUCATION: Education details: see above Person educated: Patient and his sister-in-law Education method: Explanation Education comprehension: needs further education  HOME EXERCISE PROGRAM:   Supplement verbal information with single written words  GOALS:  Goals reviewed with patient? Yes  SHORT TERM GOALS: Target date: 10 sessions Updated 02/12/2024  Updated 12/25/2023   4. With minimal assistance, pt will point to 1 common object by function in a field of 3 with 90% accuracy.  Baseline:  Goal status: INITIAL: progress made: With minimal assistance, pt will point to 1 common object by function in a field of 3 with 80% accuracy in 3 out of 5 sessions.   MET  5. With minimal to moderate assistance, pt will point, in sequence to 2  common objects by function in field of 3 with 90% accuracy.   Baseline:  Goal status: INITIAL: progress made: With minimal to moderate assistance, pt will point, in sequence to 2 common objects by function in field of 3 with 80% accuracy in 3 out of 5 sessions.   MET  6. With minimal to moderate assistance pt will verablly approximate names of basic functional objects with > 75% speech intelligiblity at the word level.    Baseline:  Goal status: INITIAL: progress made, continue targeting: With minimal to moderate assistance pt with verablly approximate names of basic functional objects with > 75% speech intelligiblity at the word level in 3 out of 5 sessions.  PROGRESS MADE  9. With minimal assistance, pt will point, in sequence, to 2 common objects by name from a field of 3 with 80% accuracy across 3  consecutive sessions.  Baseline:  Goal status: INITIAL: PROGRESS MADE  LONG TERM GOALS: Target date: 05/06/2024  Updated: 02/12/2024 Updated 12/25/2023  1. Pt will use multimodal communication and maximal assistance to communicate basic wants and needs in 2 out of 5 opportunities per pt and family report.  Baseline:  Goal status: INITIAL: progress, continue targeting  2.  With Maximal A, patient/family will demonstrate understanding of the following concepts: aphasia, spontaneous recovery, communication vs conversation, strengths/strategies to promote success, local resources by answering multiple choice questions with 50% accuracy when provided supported conversation in order to increase patient and caregiver's participation in medical care.  Baseline:  Goal status: INITIAL: progress, continue targeting   ASSESSMENT:  CLINICAL IMPRESSION: Patient is a 73 y.o. right handed male who was seen today for a speech language treatment d/t left MCA CVA.   Pt presents with improving language impairment, now conceptualized as moderate to severe Wernicke's Aphasia (as per WAB-R, above).   Verbal Communication Pt's verbal communication continues to be fluent but increased number of islands of intelligible speech especially when commenting.   Auditory Comprehension Pt with improved auditory comprehension as evidenced by his ability to select objects in field of 4, answer basic to progressing semi-complex yes/no questions and following 1 step directions.   Written Language Pt with improved letter recognition and ability to copy written words.   Reading Comprehension Continues to be a strength for pt. Utilized to supplement receptive language deficits.   See the above treatment note for details.   OBJECTIVE IMPAIRMENTS include expressive language, receptive language, aphasia, apraxia, and written language. These impairments are limiting patient from managing medications, managing appointments,  managing finances, household responsibilities, ADLs/IADLs, and effectively communicating at home and in community. Factors affecting potential to achieve goals and functional outcome are severity of impairments, family/community support, and significantly reduced health literacy. Patient will benefit from skilled SLP services to address above impairments and improve overall function.  REHAB POTENTIAL: Good  PLAN: SLP FREQUENCY: 1-2x/week  SLP DURATION: 12 weeks  PLANNED INTERVENTIONS: Language facilitation, Cueing hierachy, Internal/external aids, Functional tasks, Multimodal communication approach, SLP instruction and feedback, Compensatory strategies, and Patient/family education    Kinsly Hild B. Rubbie, M.S., CCC-SLP, CBIS Speech-Language Pathologist Certified Brain Injury Specialist [facility]  [facility] [phone] Ascom [phone] Fax [phone]   "

## 2024-02-22 ENCOUNTER — Ambulatory Visit
Admission: RE | Admit: 2024-02-22 | Discharge: 2024-02-22 | Disposition: A | Discharge status: Home / Self Care | Source: Ambulatory Visit | Attending: Urology | Admitting: Urology

## 2024-02-22 DIAGNOSIS — I700 Atherosclerosis of aorta: Secondary | ICD-10-CM | POA: Insufficient documentation

## 2024-02-22 DIAGNOSIS — R590 Localized enlarged lymph nodes: Secondary | ICD-10-CM | POA: Diagnosis not present

## 2024-02-22 DIAGNOSIS — I7143 Infrarenal abdominal aortic aneurysm, without rupture: Secondary | ICD-10-CM | POA: Insufficient documentation

## 2024-02-22 DIAGNOSIS — R972 Elevated prostate specific antigen [PSA]: Secondary | ICD-10-CM | POA: Insufficient documentation

## 2024-02-22 DIAGNOSIS — M7981 Nontraumatic hematoma of soft tissue: Secondary | ICD-10-CM | POA: Diagnosis not present

## 2024-02-22 MED ORDER — IOHEXOL 300 MG/ML  SOLN
100.0000 mL | Freq: Once | INTRAMUSCULAR | Status: AC | PRN
  Administered 2024-02-22: 100 mL via INTRAVENOUS

## 2024-02-25 DIAGNOSIS — I4892 Unspecified atrial flutter: Secondary | ICD-10-CM | POA: Diagnosis not present

## 2024-02-26 ENCOUNTER — Encounter: Admitting: Occupational Therapy

## 2024-02-26 ENCOUNTER — Ambulatory Visit: Attending: Internal Medicine | Admitting: Speech Pathology

## 2024-02-26 DIAGNOSIS — R41841 Cognitive communication deficit: Secondary | ICD-10-CM | POA: Insufficient documentation

## 2024-02-26 DIAGNOSIS — R4701 Aphasia: Secondary | ICD-10-CM | POA: Insufficient documentation

## 2024-02-26 DIAGNOSIS — R482 Apraxia: Secondary | ICD-10-CM | POA: Insufficient documentation

## 2024-02-26 DIAGNOSIS — R2681 Unsteadiness on feet: Secondary | ICD-10-CM | POA: Insufficient documentation

## 2024-02-26 DIAGNOSIS — M25512 Pain in left shoulder: Secondary | ICD-10-CM | POA: Insufficient documentation

## 2024-02-26 DIAGNOSIS — R278 Other lack of coordination: Secondary | ICD-10-CM | POA: Insufficient documentation

## 2024-02-26 DIAGNOSIS — I6340 Cerebral infarction due to embolism of unspecified cerebral artery: Secondary | ICD-10-CM | POA: Insufficient documentation

## 2024-02-26 DIAGNOSIS — M6281 Muscle weakness (generalized): Secondary | ICD-10-CM | POA: Insufficient documentation

## 2024-02-26 NOTE — Therapy (Signed)
 " OUTPATIENT SPEECH LANGUAGE PATHOLOGY  SPEECH LANGUAGE TREATMENT NOTE    Patient Name: Gregory Lane MRN: [id number] DOB:Feb 25, 1950, 74 y.o., male 68 Date: 02/26/2024   PCP: Denyse DELENA Bathe, MD REFERRING PROVIDER: Arland Earnie Pouch, MD     End of Session - 02/26/24 1431     Visit Number 42    Number of Visits 64    Date for Recertification  05/06/24    Authorization Type [employer] [place]    Progress Note Due on Visit 50    SLP Start Time 1150    SLP Stop Time  1230    SLP Time Calculation (min) 40 min    Activity Tolerance Patient tolerated treatment well          Past Medical History:  Diagnosis Date   Anxiety    Aortic atherosclerosis 02/16/2017   Arthritis    Blind left eye    COPD (chronic obstructive pulmonary disease) (HCC)    Depression    Dysrhythmia    A-fib   GERD (gastroesophageal reflux disease)    Hypertension    Low TSH level 02/17/2017   Myocardial infarction [facility])    PAD (peripheral artery disease)    Paroxysmal atrial fibrillation (HCC)    Perforated duodenal ulcer (HCC)    Stroke (HCC)    left side is weak   Past Surgical History:  Procedure Laterality Date   BRONCHIAL BIOPSY  03/22/2022   Procedure: BRONCHIAL BIOPSIES;  Surgeon: Isadora Hose, MD;  [facility]: [facility];  Service: Pulmonary;;   BRONCHIAL NEEDLE ASPIRATION BIOPSY  03/22/2022   Procedure: BRONCHIAL NEEDLE ASPIRATION BIOPSIES;  Surgeon: Isadora Hose, MD;  [facility]: [facility];  Service: Pulmonary;;   ENDOBRONCHIAL ULTRASOUND  03/22/2022   Procedure: ENDOBRONCHIAL ULTRASOUND;  Surgeon: Isadora Hose, MD;  [facility]: [facility];  Service: Pulmonary;;   EXPLORATORY LAPAROTOMY  05/12/2015   EYE SURGERY     FRACTURE SURGERY     LAPAROTOMY N/A 05/10/2015   Procedure: EXPLORATORY LAPAROTOMY;  Surgeon: Lynda Leos, MD;  [facility];  Service: General;  Laterality: N/A;   LEFT HEART CATH AND CORONARY ANGIOGRAPHY N/A 05/02/2016   Procedure: Left Heart Cath  and Coronary Angiography;  Surgeon: Gordy Bergamo, MD;  Location: [facility] INVASIVE CV LAB;  Service: Cardiovascular;  Laterality: N/A;   LOWER EXTREMITY ANGIOGRAPHY Left 09/21/2021   Procedure: Lower Extremity Angiography;  Surgeon: Jama Cordella KANDICE, MD;  Location: [facility] INVASIVE CV LAB;  Service: Cardiovascular;  Laterality: Left;   Patient Active Problem List   Diagnosis Date Noted   Aphasia 12/31/2023   Arthritis of left shoulder 12/31/2023   Allergic rhinitis due to pollen 12/11/2022   Tinea cruris 12/11/2022   BPH associated with nocturia 12/11/2022   Chest pain, non-cardiac 04/11/2022   Adenocarcinoma of lower lobe of right lung (HCC) 03/28/2022   Pulmonary nodule 1 cm or greater in diameter 03/22/2022   Leg weakness, bilateral 02/09/2022   Osteopenia 10/13/2021   PAD (peripheral artery disease) 09/21/2021   GERD without esophagitis 09/21/2021   Dyslipidemia 09/21/2021   Depression 09/21/2021   Atrial flutter (HCC) 09/21/2021   Chronic obstructive pulmonary disease (COPD) (HCC) 09/21/2021   Coronary artery disease 09/21/2021   Cerebrovascular accident (CVA) due to embolism of cerebral artery (HCC) 09/06/2021   Bilateral edema of lower extremity 08/05/2020   Vasovagal syncope 08/02/2020   Annual physical exam 12/26/2019   Hyperlipidemia 12/26/2019   Abrasion of right hand 12/26/2019   Metabolic syndrome 11/11/2019   Low TSH level  02/17/2017   Atrial flutter with rapid ventricular response (HCC) 02/16/2017   Cigarette smoker 02/16/2017   COPD with acute exacerbation (HCC) 02/16/2017   Elevated lactic acid level 02/16/2017   Aortic atherosclerosis 02/16/2017   Productive cough    Hypoxia 05/24/2015   SOB (shortness of breath) 05/24/2015   COPD suggested by initial evaluation 05/17/2015   Essential hypertension 05/17/2015   Smoking 05/17/2015    ONSET DATE: 08/09/2023   REFERRING DIAG: I63.9 (ICD-10-CM) - Cerebral infarction, unspecified   THERAPY DIAG:  Aphasia  Rationale  for Evaluation and Treatment Rehabilitation  SUBJECTIVE:   PERTINENT HISTORY and DIAGNOSTIC FINDINGS: Pt is a 74 year old right handed male who presented to [facility] ED on 08/09/2023 with stroke-like symptoms - slurred speech and difficulty answering questions and weakness to his right side extremities. CT head and subsequent CTA head and neck shows left M2 occlusion with large penumbra in the region. Pt transferred to [facility] for mechanical thrombectomy on 08/10/2023. Pt admitted at [facility] from 08/10/2023 thru 08/21/2023 with recommended for home health.   MRI brain: Remonstrated left MCA territory infarct primarily involving the parietal lobe.   Additional medical history includes: blindness left eye, A-fib on warfarin, COPD, HFpEF, HTN, current smoker, ethanol abuse, RLL adenocarcinoma s/p SBRT (2024)    PAIN:  Are you having pain? Unable to communicate  FALLS: Has patient fallen in last 6 months?  No  LIVING ENVIRONMENT: Lives with: sister-in-law Lives in: House/apartment  PLOF:  Level of assistance: Independent with ADLs, Independent with IADLs, Comment: poor health literacy, difficulty with reading and writing per family report Employment: Retired   PATIENT GOALS    per pt's sister-in-law, she hopes he will talk again  SUBJECTIVE STATEMENT: Pt pleasant,  Pt accompanied by: sister-in-law  OBJECTIVE:   TODAY'S TREATMENT:  Skilled ST treatment session targeted pt's aphasia and apraxia of speech goals. SLP facilitated session by providing the following interventions:   Pt's sister-in-law continues to report increased use of gestures to aid communication and I have been working with him more too. She reports she labels objects while holding them now during functional activities.   Pt with increased ability during today's session to select object picture provided spoken word from field of 3 in 19 out of 20 and when selecting written word in field of 3 he demonstrated improved ability  by 11 out of 25 accuracy.   SLP further gathered additional list of favorite foods to target in future sessions.   PATIENT EDUCATION: Education details: see above Person educated: Patient and his sister-in-law Education method: Explanation Education comprehension: needs further education  HOME EXERCISE PROGRAM:   Supplement verbal information with single written words  GOALS:  Goals reviewed with patient? Yes  SHORT TERM GOALS: Target date: 10 sessions Updated 02/12/2024  Updated 12/25/2023   4. With minimal assistance, pt will point to 1 common object by function in a field of 3 with 90% accuracy.  Baseline:  Goal status: INITIAL: progress made: With minimal assistance, pt will point to 1 common object by function in a field of 3 with 80% accuracy in 3 out of 5 sessions.   MET  5. With minimal to moderate assistance, pt will point, in sequence to 2 common objects by function in field of 3 with 90% accuracy.   Baseline:  Goal status: INITIAL: progress made: With minimal to moderate assistance, pt will point, in sequence to 2 common objects by function in field of 3 with 80% accuracy in 3  out of 5 sessions.   MET  6. With minimal to moderate assistance pt will verablly approximate names of basic functional objects with > 75% speech intelligiblity at the word level.    Baseline:  Goal status: INITIAL: progress made, continue targeting: With minimal to moderate assistance pt with verablly approximate names of basic functional objects with > 75% speech intelligiblity at the word level in 3 out of 5 sessions.  PROGRESS MADE  9. With minimal assistance, pt will point, in sequence, to 2 common objects by name from a field of 3 with 80% accuracy across 3 consecutive sessions.  Baseline:  Goal status: INITIAL: PROGRESS MADE  LONG TERM GOALS: Target date: 05/06/2024  Updated: 02/12/2024 Updated 12/25/2023  1. Pt will use multimodal communication and maximal assistance to communicate  basic wants and needs in 2 out of 5 opportunities per pt and family report.  Baseline:  Goal status: INITIAL: progress, continue targeting  2.  With Maximal A, patient/family will demonstrate understanding of the following concepts: aphasia, spontaneous recovery, communication vs conversation, strengths/strategies to promote success, local resources by answering multiple choice questions with 50% accuracy when provided supported conversation in order to increase patient and caregiver's participation in medical care.  Baseline:  Goal status: INITIAL: progress, continue targeting   ASSESSMENT:  CLINICAL IMPRESSION: Patient is a 74 y.o. right handed male who was seen today for a speech language treatment d/t left MCA CVA.   Pt presents with improving language impairment, now conceptualized as moderate to severe Wernicke's Aphasia (as per WAB-R, above).   Verbal Communication Pt's verbal communication continues to be fluent but increased number of islands of intelligible speech especially when commenting.   Auditory Comprehension Pt with improved auditory comprehension as evidenced by his ability to select objects in field of 4, answer basic to progressing semi-complex yes/no questions and following 1 step directions.   Written Language Pt with improved letter recognition and ability to copy written words.   Reading Comprehension Continues to be a strength for pt. Utilized to supplement receptive language deficits.   See the above treatment note for details.   OBJECTIVE IMPAIRMENTS include expressive language, receptive language, aphasia, apraxia, and written language. These impairments are limiting patient from managing medications, managing appointments, managing finances, household responsibilities, ADLs/IADLs, and effectively communicating at home and in community. Factors affecting potential to achieve goals and functional outcome are severity of impairments, family/community support,  and significantly reduced health literacy. Patient will benefit from skilled SLP services to address above impairments and improve overall function.  REHAB POTENTIAL: Good  PLAN: SLP FREQUENCY: 1-2x/week  SLP DURATION: 12 weeks  PLANNED INTERVENTIONS: Language facilitation, Cueing hierachy, Internal/external aids, Functional tasks, Multimodal communication approach, SLP instruction and feedback, Compensatory strategies, and Patient/family education    Tashema Tiller B. Rubbie, M.S., CCC-SLP, CBIS Speech-Language Pathologist Certified Brain Injury Specialist [facility]  [facility] [phone] Ascom [phone] Fax [phone]   "

## 2024-02-28 ENCOUNTER — Ambulatory Visit: Admitting: Speech Pathology

## 2024-02-28 ENCOUNTER — Ambulatory Visit

## 2024-02-28 ENCOUNTER — Encounter: Admitting: Occupational Therapy

## 2024-02-28 DIAGNOSIS — M6281 Muscle weakness (generalized): Secondary | ICD-10-CM

## 2024-02-28 DIAGNOSIS — M25512 Pain in left shoulder: Secondary | ICD-10-CM

## 2024-02-28 DIAGNOSIS — R4701 Aphasia: Secondary | ICD-10-CM

## 2024-02-28 DIAGNOSIS — R278 Other lack of coordination: Secondary | ICD-10-CM

## 2024-02-28 DIAGNOSIS — R41841 Cognitive communication deficit: Secondary | ICD-10-CM

## 2024-02-28 DIAGNOSIS — R2681 Unsteadiness on feet: Secondary | ICD-10-CM

## 2024-02-28 NOTE — Therapy (Signed)
 " OUTPATIENT PHYSICAL THERAPY NEURO TREATMENT   Patient Name: Gregory Lane MRN: [id number] DOB:03-Sep-1950, 74 y.o., male Today's Date: 02/28/2024   PCP: Fernand Denyse LABOR, MD  REFERRING PROVIDER: Albina GORMAN Dine, MD   END OF SESSION:  PT End of Session - 02/28/24 1103     Visit Number 8    Number of Visits 25    Date for Recertification  04/15/24    PT Start Time 1103    PT Stop Time 1142    PT Time Calculation (min) 39 min    Equipment Utilized During Treatment Gait belt    Activity Tolerance Patient tolerated treatment well    Behavior During Therapy [facility] for tasks assessed/performed            Past Medical History:  Diagnosis Date   Anxiety    Aortic atherosclerosis 02/16/2017   Arthritis    Blind left eye    COPD (chronic obstructive pulmonary disease) (HCC)    Depression    Dysrhythmia    A-fib   GERD (gastroesophageal reflux disease)    Hypertension    Low TSH level 02/17/2017   Myocardial infarction [facility])    PAD (peripheral artery disease)    Paroxysmal atrial fibrillation (HCC)    Perforated duodenal ulcer (HCC)    Stroke (HCC)    left side is weak   Past Surgical History:  Procedure Laterality Date   BRONCHIAL BIOPSY  03/22/2022   Procedure: BRONCHIAL BIOPSIES;  Surgeon: Isadora Hose, MD;  Location: [facility] ENDOSCOPY;  Service: Pulmonary;;   BRONCHIAL NEEDLE ASPIRATION BIOPSY  03/22/2022   Procedure: BRONCHIAL NEEDLE ASPIRATION BIOPSIES;  Surgeon: Isadora Hose, MD;  Location: [facility] ENDOSCOPY;  Service: Pulmonary;;   ENDOBRONCHIAL ULTRASOUND  03/22/2022   Procedure: ENDOBRONCHIAL ULTRASOUND;  Surgeon: Isadora Hose, MD;  Location: [facility] ENDOSCOPY;  Service: Pulmonary;;   EXPLORATORY LAPAROTOMY  05/12/2015   EYE SURGERY     FRACTURE SURGERY     LAPAROTOMY N/A 05/10/2015   Procedure: EXPLORATORY LAPAROTOMY;  Surgeon: Lynda Leos, MD;  Location: [facility] OR;  Service: General;  Laterality: N/A;   LEFT HEART CATH AND CORONARY ANGIOGRAPHY N/A 05/02/2016    Procedure: Left Heart Cath and Coronary Angiography;  Surgeon: Gordy Bergamo, MD;  Location: [facility] INVASIVE CV LAB;  Service: Cardiovascular;  Laterality: N/A;   LOWER EXTREMITY ANGIOGRAPHY Left 09/21/2021   Procedure: Lower Extremity Angiography;  Surgeon: Jama Cordella KANDICE, MD;  Location: [facility] INVASIVE CV LAB;  Service: Cardiovascular;  Laterality: Left;   Patient Active Problem List   Diagnosis Date Noted   Aphasia 12/31/2023   Arthritis of left shoulder 12/31/2023   Allergic rhinitis due to pollen 12/11/2022   Tinea cruris 12/11/2022   BPH associated with nocturia 12/11/2022   Chest pain, non-cardiac 04/11/2022   Adenocarcinoma of lower lobe of right lung (HCC) 03/28/2022   Pulmonary nodule 1 cm or greater in diameter 03/22/2022   Leg weakness, bilateral 02/09/2022   Osteopenia 10/13/2021   PAD (peripheral artery disease) 09/21/2021   GERD without esophagitis 09/21/2021   Dyslipidemia 09/21/2021   Depression 09/21/2021   Atrial flutter (HCC) 09/21/2021   Chronic obstructive pulmonary disease (COPD) (HCC) 09/21/2021   Coronary artery disease 09/21/2021   Cerebrovascular accident (CVA) due to embolism of cerebral artery (HCC) 09/06/2021   Bilateral edema of lower extremity 08/05/2020   Vasovagal syncope 08/02/2020   Annual physical exam 12/26/2019   Hyperlipidemia 12/26/2019   Abrasion of right hand 12/26/2019   Metabolic syndrome 11/11/2019   Low TSH  level 02/17/2017   Atrial flutter with rapid ventricular response (HCC) 02/16/2017   Cigarette smoker 02/16/2017   COPD with acute exacerbation (HCC) 02/16/2017   Elevated lactic acid level 02/16/2017   Aortic atherosclerosis 02/16/2017   Productive cough    Hypoxia 05/24/2015   SOB (shortness of breath) 05/24/2015   COPD suggested by initial evaluation 05/17/2015   Essential hypertension 05/17/2015   Smoking 05/17/2015    ONSET DATE: Unsure, a long time   REFERRING DIAG:  M19.012 (ICD-10-CM) - Arthritis of left shoulder   I63.40 (ICD-10-CM) - Cerebrovascular accident (CVA) due to embolism of cerebral artery (HCC)     THERAPY DIAG:  Unsteadiness on feet  Muscle weakness (generalized)  Left shoulder pain, unspecified chronicity  Other lack of coordination  Cognitive communication deficit  Rationale for Evaluation and Treatment: Rehabilitation  SUBJECTIVE:                                                                                                                                                                                             SUBJECTIVE STATEMENT:  Pt caregiver reports no significant changes.He continues to have pain in L shoulder. He reports he has not been doing his exercisers at home.    At Eval: Patient's sister in-law was present to assist with subjective portion of evaluation. Patient had CVA in June effecting R side. He did not have PT treatment following CVA initially due to focusing on speech first. Patient was noted to have damage to the Wernicke's Area causing aphasia making communication slightly difficult during today's evaluation. Patient walks without AD at home, but has decreased balance when walking. His sister in-law reports the R leg gave out last week and he fell in bedroom. He has also fallen in past when performing activities on uneven surfaces such as taking out garbage. He does do a little yard work such as picking up limbs and helping out in yard, but not much.   Patient reports that he has had L shoulder pain for a long time. Cannot say if he has had any previous injuries and does not think he has had imaging of shoulder. He expressed having sharp pain with movement of L shoulder from the shoulder to the elbow.    Pt accompanied by: Sister in-law.   PERTINENT HISTORY: CVA effecting L side in June 2025, Wernicke's aphasia, R shoulder pain with movement.   PAIN:  Are you having pain? 9/10, but has difficulty describing.   PRECAUTIONS: Fall and Other: Decreased  safety awareness.  RED FLAGS: None   WEIGHT BEARING RESTRICTIONS: No  FALLS: Has patient fallen in last 6  months? Yes. Number of falls Patient is unsure.   LIVING ENVIRONMENT: Lives with: lives with their family Lives in: House/apartment Stairs: Yes: External: 4 steps; on right going up and on left going up Has following equipment at home: None  PLOF: Independent  PATIENT GOALS: Improve L shoulder function/pain levels; Improve balance with gait/ADLs.   OBJECTIVE:  Note: Objective measures were completed at Evaluation unless otherwise noted.  DIAGNOSTIC FINDINGS:   COMPARISON:  CT head 09/06/2021.   FINDINGS: Brain: Encephalomalacia in the anterior right temporal and frontal lobes. Smaller area of encephalomalacia in the inferior left frontal lobe. Remote lacunar infarct in the posterior limb of the left internal capsule. No evidence of acute large vascular territory, acute hemorrhage, mass lesion or midline shift.  COGNITION: Overall cognitive status: Wernicke's Aphasia    SENSATION: Not tested  COORDINATION: Decreased speed with toe taps.     POSTURE: rounded shoulders, forward head, and increased thoracic kyphosis   LOWER EXTREMITY MMT:    MMT Right Eval Left Eval  Hip flexion 4 3+  Hip extension    Hip abduction 5 5  Hip adduction    Hip internal rotation    Hip external rotation    Knee flexion 5 4+  Knee extension 5 4  Ankle dorsiflexion 5 5  Ankle plantarflexion    Ankle inversion    Ankle eversion    (Blank rows = not tested)  UPPER EXTREMITY MMT:  MMT Right eval Left eval  Shoulder flexion 5 4-  Shoulder extension    Shoulder abduction 5 4  Shoulder adduction    Shoulder extension    Shoulder internal rotation 5 5  Shoulder external rotation 5 4  Middle trapezius    Lower trapezius    Elbow flexion    Elbow extension    Wrist flexion    Wrist extension    Wrist ulnar deviation    Wrist radial deviation    Wrist pronation     Wrist supination    Grip strength     (Blank rows = not tested)   UPPER EXTREMITY ROM:  Active ROM Right eval Left eval  Shoulder flexion 155 108  Shoulder extension    Shoulder abduction 160 90  Shoulder adduction    Shoulder extension    Shoulder internal rotation [facility]  Shoulder external rotation 57 40  Elbow flexion    Elbow extension    Wrist flexion    Wrist extension    Wrist ulnar deviation    Wrist radial deviation    Wrist pronation    Wrist supination     (Blank rows = not tested)  BED MOBILITY:  Not tested  TRANSFERS: Sit to stand: Complete Independence and CGA  Assistive device utilized: Use of one upper extremity to assist trunk lift.      RAMP:  Not tested  CURB:  Not tested  STAIRS: Not tested GAIT: Findings: Gait Characteristics: decreased arm swing- Right, decreased arm swing- Left, decreased step length- Right, decreased step length- Left, and decreased stride length, Distance walked: 50', and Comments: .  FUNCTIONAL TESTS:  5 times sit to stand: 27.7'' Timed up and go (TUG): 65'' 10 meter walk test: 45''   PATIENT SURVEYS:  Unable to perform survey due to patient's cognition.  TREATMENT DATE: 02/28/2024    TE- To improve strength, endurance, mobility, and function of specific targeted muscle groups or improve joint range of motion or improve muscle flexibility  -Supine shoulder press with PVC and 2 lb. AW. 3x10 -Supine AAROM flexoin with PVC and 2 lb. AW. 3x10  -Sit to stand 2x15 from mat using bilateral UE to assist.   -Rows - GTB 3x10  -Seated shoulder flexion: 2x10  -Seated shoulder abduction: 2x10  -GT: 260' with CGA for safety. Verbal cues to taker larger steps and for foot clearance.   PATIENT EDUCATION: Education details: Patient educated on exercises to perform at home to improve L shoulder  mobility and strength with proper mechanics/positioning. Person educated: Patient and sister in-law Education method: Explanation, Demonstration, Verbal cues, and Handouts Education comprehension: returned demonstration  HOME EXERCISE PROGRAM:  Access Code: [email or web address] URL: [email or web address] Date: 01/22/2024 Prepared by: Norman Sharps  Exercises - Seated Shoulder External Rotation AAROM with Cane and Hand in Neutral  - 1 x daily - 7 x weekly - 1 sets - 15 reps - Seated Scapular Retraction  - 1 x daily - 7 x weekly - 1 sets - 15 reps - Supine Shoulder Flexion Extension AAROM with Dowel  - 1 x daily - 7 x weekly - 1 sets - 15 reps - Supine Shoulder Press with Dowel  - 1 x daily - 7 x weekly - 1 sets - 15 reps - Seated Shoulder Flexion AAROM with Dowel  - 1 x daily - 7 x weekly - 1 sets - 15 reps  GOALS: Goals reviewed with patient? Yes  SHORT TERM GOALS: Target date: 02/19/2024  Patient will be independent with HEP allowing him to perform exercises at home with proper positioning/mechanics/safety.  Baseline: HEP initiated at evaluation with handout.  Goal status: INITIAL    LONG TERM GOALS: Target date: 04/15/2024  1.  Patient (> 29 years old) will complete five times sit to stand test in < 15 seconds indicating an increased LE strength and improved balance. Baseline: Test at 1st treatment. Goal status: INITIAL  2.  Patient will improve gait mechanics showing good arm swing, foot clearance and increased stride length allowing him to ambulate more efficiently. Baseline: Decreased UE movement, lack of bilateral foot clearance during swing phase, decreased stride length, decreased gait speed. Goal status: INITIAL   3.  Patient will reduce timed up and go to <11 seconds to reduce fall risk and demonstrate improved transfer/gait ability. Baseline: 65'' without AD. Goal status: INITIAL  4.  Patient will increase 10 meter walk test to >1.14m/s as to improve gait speed  for better community ambulation and to reduce fall risk. Baseline: 65'' Goal status: INITIAL  5.  Patient will improve L shouder flexion/abduction AROM to match that of the R by discharge allowing him to reach objects at greater heights such as in kitchen cabinets.  Baseline: Flexion - 108 degrees; Abduction - 90 degrees.  Goal status: INITIAL  6.  Patient will improve L shoulder strength to 4+/5 MMT by discharge allowing him to hold and carry items in L upper extremity with decreased difficulty.  Baseline: See MMT table.  Goal status: INITIAL   ASSESSMENT:  CLINICAL IMPRESSION:   Continued PT POC for L shoulder pain. Session focused on L shoulder AAROM and AROM along with brief LE functional strength at end of session. Today he performed AAROM in supine instead of seated with shoulder press and overhead flexion using PVC. He tolerated new exercises well  with no expression of increased pain in shoulder. He progressed sit to stands performing 2x15 today. He also performed seated shoulder flex/abd today showing improved ROM and strength compared to previous visits in the L shoulder. He also improved gait distance walking 260' with CGA without AD.   Overall, patient showed improvement during today's treatment performing exercises with less difficulty than before and progressing on most previous exercises.     Overall, patient tolerated treatment better today than in previous visits as he was able to progress on existing exercises and add additional exercises today.   OBJECTIVE IMPAIRMENTS: Abnormal gait, decreased activity tolerance, decreased balance, decreased coordination, difficulty walking, decreased ROM, decreased strength, impaired UE functional use, and pain.   ACTIVITY LIMITATIONS: carrying, lifting, standing, squatting, stairs, and transfers  PARTICIPATION LIMITATIONS: meal prep, cleaning, laundry, interpersonal relationship, and yard work  PERSONAL FACTORS: Age, Past/current  experiences, and 3+ comorbidities: Stroke, A -fib, PAD, HTN, hx. Of MI, COPD, blind in L eye. are also affecting patient's functional outcome.   REHAB POTENTIAL: Fair Due to patient's difficulty with communication.  CLINICAL DECISION MAKING: Evolving/moderate complexity  EVALUATION COMPLEXITY: Moderate  PLAN:  PT FREQUENCY: 1-2x/week  PT DURATION: 12 weeks  PLANNED INTERVENTIONS: 97164- PT Re-evaluation, 97750- Physical Performance Testing, 97110-Therapeutic exercises, 97530- Therapeutic activity, [id number]- Neuromuscular re-education, 97535- Self Care, [id number]- Manual therapy, [id number]- Gait training, [phone]- Electrical stimulation (unattended), [phone]- Electrical stimulation (manual), Patient/Family education, Balance training, Stair training, Taping, Joint mobilization, Cryotherapy, and Moist heat  PLAN FOR NEXT SESSION:  -Continue working to improve functional movement, gait, transfers, and L shoulder strength/mobility.  -Continue progressing exercises to patient's tolerance level.      Norman Sharps, PT, DPT Physical Therapist - [facility]  Norman KATHEE Sharps, [place] 02/28/2024, 11:47 AM        "

## 2024-02-28 NOTE — Therapy (Signed)
 " OUTPATIENT SPEECH LANGUAGE PATHOLOGY  SPEECH LANGUAGE TREATMENT NOTE    Patient Name: Gregory Lane MRN: [id number] DOB:07/20/1950, 74 y.o., male 24 Date: 02/28/2024   PCP: Denyse DELENA Bathe, MD REFERRING PROVIDER: Arland Earnie Pouch, MD     End of Session - 02/28/24 1959     Visit Number 43    Number of Visits 64    Date for Recertification  05/06/24    Authorization Type [employer] [place]    Progress Note Due on Visit 50    SLP Start Time 1145    SLP Stop Time  1220    SLP Time Calculation (min) 35 min    Activity Tolerance Patient tolerated treatment well          Past Medical History:  Diagnosis Date   Anxiety    Aortic atherosclerosis 02/16/2017   Arthritis    Blind left eye    COPD (chronic obstructive pulmonary disease) (HCC)    Depression    Dysrhythmia    A-fib   GERD (gastroesophageal reflux disease)    Hypertension    Low TSH level 02/17/2017   Myocardial infarction [facility])    PAD (peripheral artery disease)    Paroxysmal atrial fibrillation (HCC)    Perforated duodenal ulcer (HCC)    Stroke (HCC)    left side is weak   Past Surgical History:  Procedure Laterality Date   BRONCHIAL BIOPSY  03/22/2022   Procedure: BRONCHIAL BIOPSIES;  Surgeon: Isadora Hose, MD;  Location: [facility];  Service: Pulmonary;;   BRONCHIAL NEEDLE ASPIRATION BIOPSY  03/22/2022   Procedure: BRONCHIAL NEEDLE ASPIRATION BIOPSIES;  Surgeon: Isadora Hose, MD;  Location: [facility];  Service: Pulmonary;;   ENDOBRONCHIAL ULTRASOUND  03/22/2022   Procedure: ENDOBRONCHIAL ULTRASOUND;  Surgeon: Isadora Hose, MD;  Location: [facility];  Service: Pulmonary;;   EXPLORATORY LAPAROTOMY  05/12/2015   EYE SURGERY     FRACTURE SURGERY     LAPAROTOMY N/A 05/10/2015   Procedure: EXPLORATORY LAPAROTOMY;  Surgeon: Lynda Leos, MD;  Location: [facility] OR;  Service: General;  Laterality: N/A;   LEFT HEART CATH AND CORONARY ANGIOGRAPHY N/A 05/02/2016   Procedure: Left Heart Cath  and Coronary Angiography;  Surgeon: Gordy Bergamo, MD;  Location: [facility] INVASIVE CV LAB;  Service: Cardiovascular;  Laterality: N/A;   LOWER EXTREMITY ANGIOGRAPHY Left 09/21/2021   Procedure: Lower Extremity Angiography;  Surgeon: Jama Cordella KANDICE, MD;  Location: [facility] INVASIVE CV LAB;  Service: Cardiovascular;  Laterality: Left;   Patient Active Problem List   Diagnosis Date Noted   Aphasia 12/31/2023   Arthritis of left shoulder 12/31/2023   Allergic rhinitis due to pollen 12/11/2022   Tinea cruris 12/11/2022   BPH associated with nocturia 12/11/2022   Chest pain, non-cardiac 04/11/2022   Adenocarcinoma of lower lobe of right lung (HCC) 03/28/2022   Pulmonary nodule 1 cm or greater in diameter 03/22/2022   Leg weakness, bilateral 02/09/2022   Osteopenia 10/13/2021   PAD (peripheral artery disease) 09/21/2021   GERD without esophagitis 09/21/2021   Dyslipidemia 09/21/2021   Depression 09/21/2021   Atrial flutter (HCC) 09/21/2021   Chronic obstructive pulmonary disease (COPD) (HCC) 09/21/2021   Coronary artery disease 09/21/2021   Cerebrovascular accident (CVA) due to embolism of cerebral artery (HCC) 09/06/2021   Bilateral edema of lower extremity 08/05/2020   Vasovagal syncope 08/02/2020   Annual physical exam 12/26/2019   Hyperlipidemia 12/26/2019   Abrasion of right hand 12/26/2019   Metabolic syndrome 11/11/2019   Low TSH level  02/17/2017   Atrial flutter with rapid ventricular response (HCC) 02/16/2017   Cigarette smoker 02/16/2017   COPD with acute exacerbation (HCC) 02/16/2017   Elevated lactic acid level 02/16/2017   Aortic atherosclerosis 02/16/2017   Productive cough    Hypoxia 05/24/2015   SOB (shortness of breath) 05/24/2015   COPD suggested by initial evaluation 05/17/2015   Essential hypertension 05/17/2015   Smoking 05/17/2015    ONSET DATE: 08/09/2023   REFERRING DIAG: I63.9 (ICD-10-CM) - Cerebral infarction, unspecified   THERAPY DIAG:  Aphasia  Rationale  for Evaluation and Treatment Rehabilitation  SUBJECTIVE:   PERTINENT HISTORY and DIAGNOSTIC FINDINGS: Pt is a 74 year old right handed male who presented to [facility] ED on 08/09/2023 with stroke-like symptoms - slurred speech and difficulty answering questions and weakness to his right side extremities. CT head and subsequent CTA head and neck shows left M2 occlusion with large penumbra in the region. Pt transferred to [facility] for mechanical thrombectomy on 08/10/2023. Pt admitted at [facility] from 08/10/2023 thru 08/21/2023 with recommended for home health.   MRI brain: Remonstrated left MCA territory infarct primarily involving the parietal lobe.   Additional medical history includes: blindness left eye, A-fib on warfarin, COPD, HFpEF, HTN, current smoker, ethanol abuse, RLL adenocarcinoma s/p SBRT (2024)    PAIN:  Are you having pain? Unable to communicate  FALLS: Has patient fallen in last 6 months?  No  LIVING ENVIRONMENT: Lives with: sister-in-law Lives in: House/apartment  PLOF:  Level of assistance: Independent with ADLs, Independent with IADLs, Comment: poor health literacy, difficulty with reading and writing per family report Employment: Retired   PATIENT GOALS    per pt's sister-in-law, she hopes he will talk again  SUBJECTIVE STATEMENT: Pt pleasant,  Pt accompanied by: sister-in-law  OBJECTIVE:   TODAY'S TREATMENT:  Skilled ST treatment session targeted pt's aphasia and apraxia of speech goals. SLP facilitated session by providing the following interventions:  Images made of multiple items on pt's favorite's list - pt able to select pictures for field of 3 with 100%, able to match picture to printed word in field of 3 with 100%; improved speech intelligibility during auditory stimulation task - improved noted for ~ 50% intelligibility at the word level  PATIENT EDUCATION: Education details: see above Person educated: Patient and his sister-in-law Education method:  Explanation Education comprehension: needs further education  HOME EXERCISE PROGRAM:   Supplement verbal information with single written words  GOALS:  Goals reviewed with patient? Yes  SHORT TERM GOALS: Target date: 10 sessions Updated 02/12/2024  Updated 12/25/2023   4. With minimal assistance, pt will point to 1 common object by function in a field of 3 with 90% accuracy.  Baseline:  Goal status: INITIAL: progress made: With minimal assistance, pt will point to 1 common object by function in a field of 3 with 80% accuracy in 3 out of 5 sessions.   MET  5. With minimal to moderate assistance, pt will point, in sequence to 2 common objects by function in field of 3 with 90% accuracy.   Baseline:  Goal status: INITIAL: progress made: With minimal to moderate assistance, pt will point, in sequence to 2 common objects by function in field of 3 with 80% accuracy in 3 out of 5 sessions.   MET  6. With minimal to moderate assistance pt will verablly approximate names of basic functional objects with > 75% speech intelligiblity at the word level.    Baseline:  Goal status: INITIAL: progress made,  continue targeting: With minimal to moderate assistance pt with verablly approximate names of basic functional objects with > 75% speech intelligiblity at the word level in 3 out of 5 sessions.  PROGRESS MADE  9. With minimal assistance, pt will point, in sequence, to 2 common objects by name from a field of 3 with 80% accuracy across 3 consecutive sessions.  Baseline:  Goal status: INITIAL: PROGRESS MADE  LONG TERM GOALS: Target date: 05/06/2024  Updated: 02/12/2024 Updated 12/25/2023  1. Pt will use multimodal communication and maximal assistance to communicate basic wants and needs in 2 out of 5 opportunities per pt and family report.  Baseline:  Goal status: INITIAL: progress, continue targeting  2.  With Maximal A, patient/family will demonstrate understanding of the following concepts:  aphasia, spontaneous recovery, communication vs conversation, strengths/strategies to promote success, local resources by answering multiple choice questions with 50% accuracy when provided supported conversation in order to increase patient and caregiver's participation in medical care.  Baseline:  Goal status: INITIAL: progress, continue targeting   ASSESSMENT:  CLINICAL IMPRESSION: Patient is a 74 y.o. right handed male who was seen today for a speech language treatment d/t left MCA CVA.   Pt presents with improving language impairment, now conceptualized as moderate to severe Wernicke's Aphasia (as per WAB-R, above).   Verbal Communication Pt's verbal communication continues to be fluent but increased number of islands of intelligible speech especially when commenting.   Auditory Comprehension Pt with improved auditory comprehension as evidenced by his ability to select objects in field of 4, answer basic to progressing semi-complex yes/no questions and following 1 step directions.   Written Language Pt with improved letter recognition and ability to copy written words.   Reading Comprehension Continues to be a strength for pt. Utilized to supplement receptive language deficits.   See the above treatment note for details.   OBJECTIVE IMPAIRMENTS include expressive language, receptive language, aphasia, apraxia, and written language. These impairments are limiting patient from managing medications, managing appointments, managing finances, household responsibilities, ADLs/IADLs, and effectively communicating at home and in community. Factors affecting potential to achieve goals and functional outcome are severity of impairments, family/community support, and significantly reduced health literacy. Patient will benefit from skilled SLP services to address above impairments and improve overall function.  REHAB POTENTIAL: Good  PLAN: SLP FREQUENCY: 1-2x/week  SLP DURATION: 12  weeks  PLANNED INTERVENTIONS: Language facilitation, Cueing hierachy, Internal/external aids, Functional tasks, Multimodal communication approach, SLP instruction and feedback, Compensatory strategies, and Patient/family education    Shantel Wesely B. Rubbie, M.S., CCC-SLP, CBIS Speech-Language Pathologist Certified Brain Injury Specialist [facility]  [facility] [phone] Ascom [phone] Fax [phone]   "

## 2024-02-29 ENCOUNTER — Ambulatory Visit

## 2024-02-29 DIAGNOSIS — I2510 Atherosclerotic heart disease of native coronary artery without angina pectoris: Secondary | ICD-10-CM

## 2024-02-29 DIAGNOSIS — I739 Peripheral vascular disease, unspecified: Secondary | ICD-10-CM

## 2024-02-29 DIAGNOSIS — I10 Essential (primary) hypertension: Secondary | ICD-10-CM

## 2024-02-29 DIAGNOSIS — I340 Nonrheumatic mitral (valve) insufficiency: Secondary | ICD-10-CM

## 2024-02-29 DIAGNOSIS — I700 Atherosclerosis of aorta: Secondary | ICD-10-CM

## 2024-02-29 DIAGNOSIS — I361 Nonrheumatic tricuspid (valve) insufficiency: Secondary | ICD-10-CM | POA: Diagnosis not present

## 2024-02-29 DIAGNOSIS — I4892 Unspecified atrial flutter: Secondary | ICD-10-CM

## 2024-02-29 DIAGNOSIS — E782 Mixed hyperlipidemia: Secondary | ICD-10-CM

## 2024-02-29 DIAGNOSIS — Z8673 Personal history of transient ischemic attack (TIA), and cerebral infarction without residual deficits: Secondary | ICD-10-CM

## 2024-02-29 DIAGNOSIS — J301 Allergic rhinitis due to pollen: Secondary | ICD-10-CM

## 2024-02-29 DIAGNOSIS — I351 Nonrheumatic aortic (valve) insufficiency: Secondary | ICD-10-CM

## 2024-02-29 DIAGNOSIS — K219 Gastro-esophageal reflux disease without esophagitis: Secondary | ICD-10-CM

## 2024-03-03 ENCOUNTER — Other Ambulatory Visit

## 2024-03-04 ENCOUNTER — Ambulatory Visit

## 2024-03-04 ENCOUNTER — Encounter: Admitting: Occupational Therapy

## 2024-03-04 ENCOUNTER — Ambulatory Visit: Admitting: Speech Pathology

## 2024-03-04 DIAGNOSIS — R2681 Unsteadiness on feet: Secondary | ICD-10-CM

## 2024-03-04 DIAGNOSIS — M6281 Muscle weakness (generalized): Secondary | ICD-10-CM

## 2024-03-04 DIAGNOSIS — R278 Other lack of coordination: Secondary | ICD-10-CM

## 2024-03-04 DIAGNOSIS — M25512 Pain in left shoulder: Secondary | ICD-10-CM

## 2024-03-04 DIAGNOSIS — R4701 Aphasia: Secondary | ICD-10-CM

## 2024-03-04 DIAGNOSIS — R41841 Cognitive communication deficit: Secondary | ICD-10-CM

## 2024-03-04 NOTE — Therapy (Signed)
 " OUTPATIENT SPEECH LANGUAGE PATHOLOGY  SPEECH LANGUAGE TREATMENT NOTE    Patient Name: Gregory Lane MRN: [id number] DOB:1950/02/24, 74 y.o., male 51 Date: 03/04/2024   PCP: Denyse DELENA Bathe, MD REFERRING PROVIDER: Arland Earnie Pouch, MD     End of Session - 03/04/24 1104     Visit Number 44    Number of Visits 64    Date for Recertification  05/06/24    Authorization Type [employer] [place]    Progress Note Due on Visit 50    SLP Start Time 1145    SLP Stop Time  1230    SLP Time Calculation (min) 45 min    Activity Tolerance Patient tolerated treatment well          Past Medical History:  Diagnosis Date   Anxiety    Aortic atherosclerosis 02/16/2017   Arthritis    Blind left eye    COPD (chronic obstructive pulmonary disease) (HCC)    Depression    Dysrhythmia    A-fib   GERD (gastroesophageal reflux disease)    Hypertension    Low TSH level 02/17/2017   Myocardial infarction [facility])    PAD (peripheral artery disease)    Paroxysmal atrial fibrillation (HCC)    Perforated duodenal ulcer (HCC)    Stroke (HCC)    left side is weak   Past Surgical History:  Procedure Laterality Date   BRONCHIAL BIOPSY  03/22/2022   Procedure: BRONCHIAL BIOPSIES;  Surgeon: Isadora Hose, MD;  [facility]: [facility];  Service: Pulmonary;;   BRONCHIAL NEEDLE ASPIRATION BIOPSY  03/22/2022   Procedure: BRONCHIAL NEEDLE ASPIRATION BIOPSIES;  Surgeon: Isadora Hose, MD;  Location: [facility];  Service: Pulmonary;;   ENDOBRONCHIAL ULTRASOUND  03/22/2022   Procedure: ENDOBRONCHIAL ULTRASOUND;  Surgeon: Isadora Hose, MD;  Location: [facility];  Service: Pulmonary;;   EXPLORATORY LAPAROTOMY  05/12/2015   EYE SURGERY     FRACTURE SURGERY     LAPAROTOMY N/A 05/10/2015   Procedure: EXPLORATORY LAPAROTOMY;  Surgeon: Lynda Leos, MD;  Location: [facility];  Service: General;  Laterality: N/A;   LEFT HEART CATH AND CORONARY ANGIOGRAPHY N/A 05/02/2016   Procedure: Left Heart Cath  and Coronary Angiography;  Surgeon: Gordy Bergamo, MD;  Location: [facility] INVASIVE CV LAB;  Service: Cardiovascular;  Laterality: N/A;   LOWER EXTREMITY ANGIOGRAPHY Left 09/21/2021   Procedure: Lower Extremity Angiography;  Surgeon: Jama Cordella KANDICE, MD;  Location: [facility] INVASIVE CV LAB;  Service: Cardiovascular;  Laterality: Left;   Patient Active Problem List   Diagnosis Date Noted   Aphasia 12/31/2023   Arthritis of left shoulder 12/31/2023   Allergic rhinitis due to pollen 12/11/2022   Tinea cruris 12/11/2022   BPH associated with nocturia 12/11/2022   Chest pain, non-cardiac 04/11/2022   Adenocarcinoma of lower lobe of right lung (HCC) 03/28/2022   Pulmonary nodule 1 cm or greater in diameter 03/22/2022   Leg weakness, bilateral 02/09/2022   Osteopenia 10/13/2021   PAD (peripheral artery disease) 09/21/2021   GERD without esophagitis 09/21/2021   Dyslipidemia 09/21/2021   Depression 09/21/2021   Atrial flutter (HCC) 09/21/2021   Chronic obstructive pulmonary disease (COPD) (HCC) 09/21/2021   Coronary artery disease 09/21/2021   Cerebrovascular accident (CVA) due to embolism of cerebral artery (HCC) 09/06/2021   Bilateral edema of lower extremity 08/05/2020   Vasovagal syncope 08/02/2020   Annual physical exam 12/26/2019   Hyperlipidemia 12/26/2019   Abrasion of right hand 12/26/2019   Metabolic syndrome 11/11/2019   Low TSH level  02/17/2017   Atrial flutter with rapid ventricular response (HCC) 02/16/2017   Cigarette smoker 02/16/2017   COPD with acute exacerbation (HCC) 02/16/2017   Elevated lactic acid level 02/16/2017   Aortic atherosclerosis 02/16/2017   Productive cough    Hypoxia 05/24/2015   SOB (shortness of breath) 05/24/2015   COPD suggested by initial evaluation 05/17/2015   Essential hypertension 05/17/2015   Smoking 05/17/2015    ONSET DATE: 08/09/2023   REFERRING DIAG: I63.9 (ICD-10-CM) - Cerebral infarction, unspecified   THERAPY DIAG:  No diagnosis  found.  Rationale for Evaluation and Treatment Rehabilitation  SUBJECTIVE:   PERTINENT HISTORY and DIAGNOSTIC FINDINGS: Pt is a 74 year old right handed male who presented to [facility] ED on 08/09/2023 with stroke-like symptoms - slurred speech and difficulty answering questions and weakness to his right side extremities. CT head and subsequent CTA head and neck shows left M2 occlusion with large penumbra in the region. Pt transferred to [facility] for mechanical thrombectomy on 08/10/2023. Pt admitted at [facility] from 08/10/2023 thru 08/21/2023 with recommended for home health.   MRI brain: Remonstrated left MCA territory infarct primarily involving the parietal lobe.   Additional medical history includes: blindness left eye, A-fib on warfarin, COPD, HFpEF, HTN, current smoker, ethanol abuse, RLL adenocarcinoma s/p SBRT (2024)    PAIN:  Are you having pain? Unable to communicate  FALLS: Has patient fallen in last 6 months?  No  LIVING ENVIRONMENT: Lives with: sister-in-law Lives in: House/apartment  PLOF:  Level of assistance: Independent with ADLs, Independent with IADLs, Comment: poor health literacy, difficulty with reading and writing per family report Employment: Retired   PATIENT GOALS    per pt's sister-in-law, she hopes he will talk again  SUBJECTIVE STATEMENT: Pt pleasant,  Pt accompanied by: sister-in-law  OBJECTIVE:   TODAY'S TREATMENT:  Skilled ST treatment session targeted pt's aphasia and apraxia of speech goals. SLP facilitated session by providing the following interventions:  Additional images printed of pt's most request meals and meal items. Pt able to select from field of 3 given verbal word in 16 out of 18 opportunities. He was able to verbally approximate words with 75% accuracy in 10 out of 18 opportunities. Pt able to place picture with written word in all opportunities.   PATIENT EDUCATION: Education details: see above Person educated: Patient and his  sister-in-law Education method: Explanation Education comprehension: needs further education  HOME EXERCISE PROGRAM:   Supplement verbal information with single written words  GOALS:  Goals reviewed with patient? Yes  SHORT TERM GOALS: Target date: 10 sessions Updated 02/12/2024  Updated 12/25/2023   4. With minimal assistance, pt will point to 1 common object by function in a field of 3 with 90% accuracy.  Baseline:  Goal status: INITIAL: progress made: With minimal assistance, pt will point to 1 common object by function in a field of 3 with 80% accuracy in 3 out of 5 sessions.   MET  5. With minimal to moderate assistance, pt will point, in sequence to 2 common objects by function in field of 3 with 90% accuracy.   Baseline:  Goal status: INITIAL: progress made: With minimal to moderate assistance, pt will point, in sequence to 2 common objects by function in field of 3 with 80% accuracy in 3 out of 5 sessions.   MET  6. With minimal to moderate assistance pt will verablly approximate names of basic functional objects with > 75% speech intelligiblity at the word level.    Baseline:  Goal status: INITIAL: progress made, continue targeting: With minimal to moderate assistance pt with verablly approximate names of basic functional objects with > 75% speech intelligiblity at the word level in 3 out of 5 sessions.  PROGRESS MADE  9. With minimal assistance, pt will point, in sequence, to 2 common objects by name from a field of 3 with 80% accuracy across 3 consecutive sessions.  Baseline:  Goal status: INITIAL: PROGRESS MADE  LONG TERM GOALS: Target date: 05/06/2024  Updated: 02/12/2024 Updated 12/25/2023  1. Pt will use multimodal communication and maximal assistance to communicate basic wants and needs in 2 out of 5 opportunities per pt and family report.  Baseline:  Goal status: INITIAL: progress, continue targeting  2.  With Maximal A, patient/family will demonstrate  understanding of the following concepts: aphasia, spontaneous recovery, communication vs conversation, strengths/strategies to promote success, local resources by answering multiple choice questions with 50% accuracy when provided supported conversation in order to increase patient and caregiver's participation in medical care.  Baseline:  Goal status: INITIAL: progress, continue targeting   ASSESSMENT:  CLINICAL IMPRESSION: Patient is a 74 y.o. right handed male who was seen today for a speech language treatment d/t left MCA CVA.   Pt presents with improving language impairment, now conceptualized as moderate to severe Wernicke's Aphasia (as per WAB-R, above).   Verbal Communication Pt's verbal communication continues to be fluent but increased number of islands of intelligible speech especially when commenting.   Auditory Comprehension Pt with improved auditory comprehension as evidenced by his ability to select objects in field of 4, answer basic to progressing semi-complex yes/no questions and following 1 step directions.   Written Language Pt with improved letter recognition and ability to copy written words.   Reading Comprehension Continues to be a strength for pt. Utilized to supplement receptive language deficits.   See the above treatment note for details.   OBJECTIVE IMPAIRMENTS include expressive language, receptive language, aphasia, apraxia, and written language. These impairments are limiting patient from managing medications, managing appointments, managing finances, household responsibilities, ADLs/IADLs, and effectively communicating at home and in community. Factors affecting potential to achieve goals and functional outcome are severity of impairments, family/community support, and significantly reduced health literacy. Patient will benefit from skilled SLP services to address above impairments and improve overall function.  REHAB POTENTIAL: Good  PLAN: SLP  FREQUENCY: 1-2x/week  SLP DURATION: 12 weeks  PLANNED INTERVENTIONS: Language facilitation, Cueing hierachy, Internal/external aids, Functional tasks, Multimodal communication approach, SLP instruction and feedback, Compensatory strategies, and Patient/family education    Macaiah Mangal B. Rubbie, M.S., CCC-SLP, CBIS Speech-Language Pathologist Certified Brain Injury Specialist [facility]  [facility] [phone] Ascom [phone] Fax [phone]   "

## 2024-03-04 NOTE — Therapy (Signed)
 " OUTPATIENT PHYSICAL THERAPY NEURO TREATMENT   Patient Name: Gregory Lane MRN: [id number] DOB:11-24-1950, 74 y.o., male Today's Date: 03/04/2024   PCP: Fernand Denyse LABOR, MD  REFERRING PROVIDER: Albina GORMAN Dine, MD   END OF SESSION:  PT End of Session - 03/04/24 1057     Visit Number 9    Number of Visits 25    Date for Recertification  04/15/24    PT Start Time 1100    PT Stop Time 1141    PT Time Calculation (min) 41 min    Equipment Utilized During Treatment Gait belt    Activity Tolerance Patient tolerated treatment well    Behavior During Therapy [facility] for tasks assessed/performed            Past Medical History:  Diagnosis Date   Anxiety    Aortic atherosclerosis 02/16/2017   Arthritis    Blind left eye    COPD (chronic obstructive pulmonary disease) (HCC)    Depression    Dysrhythmia    A-fib   GERD (gastroesophageal reflux disease)    Hypertension    Low TSH level 02/17/2017   Myocardial infarction [facility])    PAD (peripheral artery disease)    Paroxysmal atrial fibrillation (HCC)    Perforated duodenal ulcer (HCC)    Stroke (HCC)    left side is weak   Past Surgical History:  Procedure Laterality Date   BRONCHIAL BIOPSY  03/22/2022   Procedure: BRONCHIAL BIOPSIES;  Surgeon: Isadora Hose, MD;  Location: [facility] ENDOSCOPY;  Service: Pulmonary;;   BRONCHIAL NEEDLE ASPIRATION BIOPSY  03/22/2022   Procedure: BRONCHIAL NEEDLE ASPIRATION BIOPSIES;  Surgeon: Isadora Hose, MD;  Location: [facility] ENDOSCOPY;  Service: Pulmonary;;   ENDOBRONCHIAL ULTRASOUND  03/22/2022   Procedure: ENDOBRONCHIAL ULTRASOUND;  Surgeon: Isadora Hose, MD;  Location: [facility] ENDOSCOPY;  Service: Pulmonary;;   EXPLORATORY LAPAROTOMY  05/12/2015   EYE SURGERY     FRACTURE SURGERY     LAPAROTOMY N/A 05/10/2015   Procedure: EXPLORATORY LAPAROTOMY;  Surgeon: Lynda Leos, MD;  Location: [facility] OR;  Service: General;  Laterality: N/A;   LEFT HEART CATH AND CORONARY ANGIOGRAPHY N/A 05/02/2016    Procedure: Left Heart Cath and Coronary Angiography;  Surgeon: Gordy Bergamo, MD;  Location: [facility] INVASIVE CV LAB;  Service: Cardiovascular;  Laterality: N/A;   LOWER EXTREMITY ANGIOGRAPHY Left 09/21/2021   Procedure: Lower Extremity Angiography;  Surgeon: Jama Cordella KANDICE, MD;  Location: [facility] INVASIVE CV LAB;  Service: Cardiovascular;  Laterality: Left;   Patient Active Problem List   Diagnosis Date Noted   Aphasia 12/31/2023   Arthritis of left shoulder 12/31/2023   Allergic rhinitis due to pollen 12/11/2022   Tinea cruris 12/11/2022   BPH associated with nocturia 12/11/2022   Chest pain, non-cardiac 04/11/2022   Adenocarcinoma of lower lobe of right lung (HCC) 03/28/2022   Pulmonary nodule 1 cm or greater in diameter 03/22/2022   Leg weakness, bilateral 02/09/2022   Osteopenia 10/13/2021   PAD (peripheral artery disease) 09/21/2021   GERD without esophagitis 09/21/2021   Dyslipidemia 09/21/2021   Depression 09/21/2021   Atrial flutter (HCC) 09/21/2021   Chronic obstructive pulmonary disease (COPD) (HCC) 09/21/2021   Coronary artery disease 09/21/2021   Cerebrovascular accident (CVA) due to embolism of cerebral artery (HCC) 09/06/2021   Bilateral edema of lower extremity 08/05/2020   Vasovagal syncope 08/02/2020   Annual physical exam 12/26/2019   Hyperlipidemia 12/26/2019   Abrasion of right hand 12/26/2019   Metabolic syndrome 11/11/2019   Low TSH  level 02/17/2017   Atrial flutter with rapid ventricular response (HCC) 02/16/2017   Cigarette smoker 02/16/2017   COPD with acute exacerbation (HCC) 02/16/2017   Elevated lactic acid level 02/16/2017   Aortic atherosclerosis 02/16/2017   Productive cough    Hypoxia 05/24/2015   SOB (shortness of breath) 05/24/2015   COPD suggested by initial evaluation 05/17/2015   Essential hypertension 05/17/2015   Smoking 05/17/2015    ONSET DATE: Unsure, a long time   REFERRING DIAG:  M19.012 (ICD-10-CM) - Arthritis of left shoulder   I63.40 (ICD-10-CM) - Cerebrovascular accident (CVA) due to embolism of cerebral artery (HCC)     THERAPY DIAG:  Unsteadiness on feet  Muscle weakness (generalized)  Left shoulder pain, unspecified chronicity  Other lack of coordination  Cognitive communication deficit  Rationale for Evaluation and Treatment: Rehabilitation  SUBJECTIVE:                                                                                                                                                                                             SUBJECTIVE STATEMENT:  Pt caregiver reports no significant changes. She reports that patient did not want to come into therapy today.    At Eval: Patient's sister in-law was present to assist with subjective portion of evaluation. Patient had CVA in June effecting R side. He did not have PT treatment following CVA initially due to focusing on speech first. Patient was noted to have damage to the Wernicke's Area causing aphasia making communication slightly difficult during today's evaluation. Patient walks without AD at home, but has decreased balance when walking. His sister in-law reports the R leg gave out last week and he fell in bedroom. He has also fallen in past when performing activities on uneven surfaces such as taking out garbage. He does do a little yard work such as picking up limbs and helping out in yard, but not much.   Patient reports that he has had L shoulder pain for a long time. Cannot say if he has had any previous injuries and does not think he has had imaging of shoulder. He expressed having sharp pain with movement of L shoulder from the shoulder to the elbow.    Pt accompanied by: Sister in-law.   PERTINENT HISTORY: CVA effecting L side in June 2025, Wernicke's aphasia, R shoulder pain with movement.   PAIN:  Are you having pain? 9/10, but has difficulty describing.   PRECAUTIONS: Fall and Other: Decreased safety awareness.  RED  FLAGS: None   WEIGHT BEARING RESTRICTIONS: No  FALLS: Has patient fallen in last 6 months? Yes. Number of falls Patient  is unsure.   LIVING ENVIRONMENT: Lives with: lives with their family Lives in: House/apartment Stairs: Yes: External: 4 steps; on right going up and on left going up Has following equipment at home: None  PLOF: Independent  PATIENT GOALS: Improve L shoulder function/pain levels; Improve balance with gait/ADLs.   OBJECTIVE:  Note: Objective measures were completed at Evaluation unless otherwise noted.  DIAGNOSTIC FINDINGS:   COMPARISON:  CT head 09/06/2021.   FINDINGS: Brain: Encephalomalacia in the anterior right temporal and frontal lobes. Smaller area of encephalomalacia in the inferior left frontal lobe. Remote lacunar infarct in the posterior limb of the left internal capsule. No evidence of acute large vascular territory, acute hemorrhage, mass lesion or midline shift.  COGNITION: Overall cognitive status: Wernicke's Aphasia    SENSATION: Not tested  COORDINATION: Decreased speed with toe taps.     POSTURE: rounded shoulders, forward head, and increased thoracic kyphosis   LOWER EXTREMITY MMT:    MMT Right Eval Left Eval  Hip flexion 4 3+  Hip extension    Hip abduction 5 5  Hip adduction    Hip internal rotation    Hip external rotation    Knee flexion 5 4+  Knee extension 5 4  Ankle dorsiflexion 5 5  Ankle plantarflexion    Ankle inversion    Ankle eversion    (Blank rows = not tested)  UPPER EXTREMITY MMT:  MMT Right eval Left eval  Shoulder flexion 5 4-  Shoulder extension    Shoulder abduction 5 4  Shoulder adduction    Shoulder extension    Shoulder internal rotation 5 5  Shoulder external rotation 5 4  Middle trapezius    Lower trapezius    Elbow flexion    Elbow extension    Wrist flexion    Wrist extension    Wrist ulnar deviation    Wrist radial deviation    Wrist pronation    Wrist supination     Grip strength     (Blank rows = not tested)   UPPER EXTREMITY ROM:  Active ROM Right eval Left eval  Shoulder flexion 155 108  Shoulder extension    Shoulder abduction 160 90  Shoulder adduction    Shoulder extension    Shoulder internal rotation [facility]  Shoulder external rotation 57 40  Elbow flexion    Elbow extension    Wrist flexion    Wrist extension    Wrist ulnar deviation    Wrist radial deviation    Wrist pronation    Wrist supination     (Blank rows = not tested)  BED MOBILITY:  Not tested  TRANSFERS: Sit to stand: Complete Independence and CGA  Assistive device utilized: Use of one upper extremity to assist trunk lift.      RAMP:  Not tested  CURB:  Not tested  STAIRS: Not tested GAIT: Findings: Gait Characteristics: decreased arm swing- Right, decreased arm swing- Left, decreased step length- Right, decreased step length- Left, and decreased stride length, Distance walked: 50', and Comments: .  FUNCTIONAL TESTS:  5 times sit to stand: 27.7'' Timed up and go (TUG): 65'' 10 meter walk test: 45''   PATIENT SURVEYS:  Unable to perform survey due to patient's cognition.  TREATMENT DATE: 03/04/2024    TE- To improve strength, endurance, mobility, and function of specific targeted muscle groups or improve joint range of motion or improve muscle flexibility  -Seated shoulder flexion: 2x10 -Seated shoulder abduction: 2x10  -Sit to stand 2x15 from mat using bilateral UE to assist.    -Supine shoulder press: 3 lb. DB  3x10 -Supine shoulder flexion with 3 lb. DB 2x10 -Sidelying shoulder ER with 3 lb. DB 2x10  -GT Trial 1: 43' with CGA for safety. Verbal cues to take larger steps and for foot clearance. -GT Trial 2: 84' with CGA for safety. Verbal cues to take larger steps and foot clearance.  -GT in figure 8 pattern x2 laps  with verbal cues for larger steps with turning.   PATIENT EDUCATION: Education details: Patient educated on exercises to perform at home to improve L shoulder mobility and strength with proper mechanics/positioning. Person educated: Patient and sister in-law Education method: Explanation, Demonstration, Verbal cues, and Handouts Education comprehension: returned demonstration  HOME EXERCISE PROGRAM:  Access Code: [email or web address] URL: [email or web address] Date: 01/22/2024 Prepared by: Norman Sharps  Exercises - Seated Shoulder External Rotation AAROM with Cane and Hand in Neutral  - 1 x daily - 7 x weekly - 1 sets - 15 reps - Seated Scapular Retraction  - 1 x daily - 7 x weekly - 1 sets - 15 reps - Supine Shoulder Flexion Extension AAROM with Dowel  - 1 x daily - 7 x weekly - 1 sets - 15 reps - Supine Shoulder Press with Dowel  - 1 x daily - 7 x weekly - 1 sets - 15 reps - Seated Shoulder Flexion AAROM with Dowel  - 1 x daily - 7 x weekly - 1 sets - 15 reps  GOALS: Goals reviewed with patient? Yes  SHORT TERM GOALS: Target date: 02/19/2024  Patient will be independent with HEP allowing him to perform exercises at home with proper positioning/mechanics/safety.  Baseline: HEP initiated at evaluation with handout.  Goal status: INITIAL    LONG TERM GOALS: Target date: 04/15/2024  1.  Patient (> 52 years old) will complete five times sit to stand test in < 15 seconds indicating an increased LE strength and improved balance. Baseline: Test at 1st treatment. Goal status: INITIAL  2.  Patient will improve gait mechanics showing good arm swing, foot clearance and increased stride length allowing him to ambulate more efficiently. Baseline: Decreased UE movement, lack of bilateral foot clearance during swing phase, decreased stride length, decreased gait speed. Goal status: INITIAL   3.  Patient will reduce timed up and go to <11 seconds to reduce fall risk and demonstrate  improved transfer/gait ability. Baseline: 65'' without AD. Goal status: INITIAL  4.  Patient will increase 10 meter walk test to >1.11m/s as to improve gait speed for better community ambulation and to reduce fall risk. Baseline: 65'' Goal status: INITIAL  5.  Patient will improve L shouder flexion/abduction AROM to match that of the R by discharge allowing him to reach objects at greater heights such as in kitchen cabinets.  Baseline: Flexion - 108 degrees; Abduction - 90 degrees.  Goal status: INITIAL  6.  Patient will improve L shoulder strength to 4+/5 MMT by discharge allowing him to hold and carry items in L upper extremity with decreased difficulty.  Baseline: See MMT table.  Goal status: INITIAL   ASSESSMENT:  CLINICAL IMPRESSION:   Continued working to progress gait mechanics/distance and L shoulder mobility/strength with progressive exercises.  Patient continues to demonstrate improvement in gait with increased foot clearance compared to previous sessions and increased gait speed/distance. He does continue to get into shuffling pattern once he becomes fatigued. Patient was also able to progress from [facility] in supine to AROM exercises in supine with light dumbbell. Patient noted to have most difficulty today when turning with gait as he tends to shuffle when turning and has difficulty getting back to longer stride lengths following a turn.  Overall, patient continues to show improvement with PT, especially with previous two treatment sessions.   OBJECTIVE IMPAIRMENTS: Abnormal gait, decreased activity tolerance, decreased balance, decreased coordination, difficulty walking, decreased ROM, decreased strength, impaired UE functional use, and pain.   ACTIVITY LIMITATIONS: carrying, lifting, standing, squatting, stairs, and transfers  PARTICIPATION LIMITATIONS: meal prep, cleaning, laundry, interpersonal relationship, and yard work  PERSONAL FACTORS: Age, Past/current experiences, and  3+ comorbidities: Stroke, A -fib, PAD, HTN, hx. Of MI, COPD, blind in L eye. are also affecting patient's functional outcome.   REHAB POTENTIAL: Fair Due to patient's difficulty with communication.  CLINICAL DECISION MAKING: Evolving/moderate complexity  EVALUATION COMPLEXITY: Moderate  PLAN:  PT FREQUENCY: 1-2x/week  PT DURATION: 12 weeks  PLANNED INTERVENTIONS: 97164- PT Re-evaluation, 97750- Physical Performance Testing, 97110-Therapeutic exercises, 97530- Therapeutic activity, [id number]- Neuromuscular re-education, 97535- Self Care, [id number]- Manual therapy, [phone]- Gait training, [phone]- Electrical stimulation (unattended), [phone]- Electrical stimulation (manual), Patient/Family education, Balance training, Stair training, Taping, Joint mobilization, Cryotherapy, and Moist heat  PLAN FOR NEXT SESSION: -Perform progress note. -Continue working on gait with turns.  -Continue working to improve functional movement, gait, transfers, and L shoulder strength/mobility.  -Continue progressing exercises to patient's tolerance level.    Norman Sharps, PT, DPT Physical Therapist - [facility]  Norman KATHEE Sharps,  03/04/2024, 11:51 AM        "

## 2024-03-06 ENCOUNTER — Ambulatory Visit

## 2024-03-06 ENCOUNTER — Ambulatory Visit: Admitting: Speech Pathology

## 2024-03-06 ENCOUNTER — Encounter: Admitting: Occupational Therapy

## 2024-03-06 DIAGNOSIS — R4701 Aphasia: Secondary | ICD-10-CM

## 2024-03-06 DIAGNOSIS — M25512 Pain in left shoulder: Secondary | ICD-10-CM

## 2024-03-06 DIAGNOSIS — R278 Other lack of coordination: Secondary | ICD-10-CM

## 2024-03-06 DIAGNOSIS — M6281 Muscle weakness (generalized): Secondary | ICD-10-CM

## 2024-03-06 DIAGNOSIS — R2681 Unsteadiness on feet: Secondary | ICD-10-CM

## 2024-03-06 DIAGNOSIS — R482 Apraxia: Secondary | ICD-10-CM

## 2024-03-06 DIAGNOSIS — R41841 Cognitive communication deficit: Secondary | ICD-10-CM

## 2024-03-06 NOTE — Therapy (Signed)
 " OUTPATIENT PHYSICAL THERAPY NEURO TREATMENT  Physical Therapy Progress Note   Dates of reporting period  01/22/2024   to   03/06/2024  Patient Name: Gregory Lane MRN: [id number] DOB:[birth year], 74 y.o., male Today's Date: 03/06/2024   PCP: Fernand Denyse LABOR, MD  REFERRING PROVIDER: Albina GORMAN Dine, MD   END OF SESSION:  PT End of Session - 03/06/24 1105     Visit Number 10    Number of Visits 25    Date for Recertification  04/15/24    PT Start Time 1103    PT Stop Time 1141    PT Time Calculation (min) 38 min    Equipment Utilized During Treatment Gait belt    Activity Tolerance Patient tolerated treatment well    Behavior During Therapy [facility] for tasks assessed/performed            Past Medical History:  Diagnosis Date   Anxiety    Aortic atherosclerosis 02/16/2017   Arthritis    Blind left eye    COPD (chronic obstructive pulmonary disease) (HCC)    Depression    Dysrhythmia    A-fib   GERD (gastroesophageal reflux disease)    Hypertension    Low TSH level 02/17/2017   Myocardial infarction [facility])    PAD (peripheral artery disease)    Paroxysmal atrial fibrillation (HCC)    Perforated duodenal ulcer (HCC)    Stroke (HCC)    left side is weak   Past Surgical History:  Procedure Laterality Date   BRONCHIAL BIOPSY  03/22/2022   Procedure: BRONCHIAL BIOPSIES;  Surgeon: Isadora Hose, MD;  Location: [facility] ENDOSCOPY;  Service: Pulmonary;;   BRONCHIAL NEEDLE ASPIRATION BIOPSY  03/22/2022   Procedure: BRONCHIAL NEEDLE ASPIRATION BIOPSIES;  Surgeon: Isadora Hose, MD;  Location: [facility] ENDOSCOPY;  Service: Pulmonary;;   ENDOBRONCHIAL ULTRASOUND  03/22/2022   Procedure: ENDOBRONCHIAL ULTRASOUND;  Surgeon: Isadora Hose, MD;  Location: [facility] ENDOSCOPY;  Service: Pulmonary;;   EXPLORATORY LAPAROTOMY  05/12/2015   EYE SURGERY     FRACTURE SURGERY     LAPAROTOMY N/A 05/10/2015   Procedure: EXPLORATORY LAPAROTOMY;  Surgeon: Lynda Leos, MD;  Location: [facility] OR;   Service: General;  Laterality: N/A;   LEFT HEART CATH AND CORONARY ANGIOGRAPHY N/A 05/02/2016   Procedure: Left Heart Cath and Coronary Angiography;  Surgeon: Gordy Bergamo, MD;  Location[facility] INVASIVE CV LAB;  Service: Cardiovascular;  Laterality: N/A;   LOWER EXTREMITY ANGIOGRAPHY Left 09/21/2021   Procedure: Lower Extremity Angiography;  Surgeon: Jama Cordella KANDICE, MD;  Location: [facility] INVASIVE CV LAB;  Service: Cardiovascular;  Laterality: Left;   Patient Active Problem List   Diagnosis Date Noted   Aphasia 12/31/2023   Arthritis of left shoulder 12/31/2023   Allergic rhinitis due to pollen 12/11/2022   Tinea cruris 12/11/2022   BPH associated with nocturia 12/11/2022   Chest pain, non-cardiac 04/11/2022   Adenocarcinoma of lower lobe of right lung (HCC) 03/28/2022   Pulmonary nodule 1 cm or greater in diameter 03/22/2022   Leg weakness, bilateral 02/09/2022   Osteopenia 10/13/2021   PAD (peripheral artery disease) 09/21/2021   GERD without esophagitis 09/21/2021   Dyslipidemia 09/21/2021   Depression 09/21/2021   Atrial flutter (HCC) 09/21/2021   Chronic obstructive pulmonary disease (COPD) (HCC) 09/21/2021   Coronary artery disease 09/21/2021   Cerebrovascular accident (CVA) due to embolism of cerebral artery (HCC) 09/06/2021   Bilateral edema of lower extremity 08/05/2020   Vasovagal syncope 08/02/2020   Annual physical exam 12/26/2019  Hyperlipidemia 12/26/2019   Abrasion of right hand 12/26/2019   Metabolic syndrome 11/11/2019   Low TSH level 02/17/2017   Atrial flutter with rapid ventricular response (HCC) 02/16/2017   Cigarette smoker 02/16/2017   COPD with acute exacerbation (HCC) 02/16/2017   Elevated lactic acid level 02/16/2017   Aortic atherosclerosis 02/16/2017   Productive cough    Hypoxia 05/24/2015   SOB (shortness of breath) 05/24/2015   COPD suggested by initial evaluation 05/17/2015   Essential hypertension 05/17/2015   Smoking 05/17/2015    ONSET DATE:  Unsure, a long time   REFERRING DIAG:  M19.012 (ICD-10-CM) - Arthritis of left shoulder  I63.40 (ICD-10-CM) - Cerebrovascular accident (CVA) due to embolism of cerebral artery (HCC)     THERAPY DIAG:  Unsteadiness on feet  Muscle weakness (generalized)  Left shoulder pain, unspecified chronicity  Other lack of coordination  Cognitive communication deficit  Aphasia  Apraxia  Rationale for Evaluation and Treatment: Rehabilitation  SUBJECTIVE:                                                                                                                                                                                             SUBJECTIVE STATEMENT:  Pt arrived with caregiver. Patient reports doing good with some pain in the L shoulder.    At Eval: Patient's sister in-law was present to assist with subjective portion of evaluation. Patient had CVA in June effecting R side. He did not have PT treatment following CVA initially due to focusing on speech first. Patient was noted to have damage to the Wernicke's Area causing aphasia making communication slightly difficult during today's evaluation. Patient walks without AD at home, but has decreased balance when walking. His sister in-law reports the R leg gave out last week and he fell in bedroom. He has also fallen in past when performing activities on uneven surfaces such as taking out garbage. He does do a little yard work such as picking up limbs and helping out in yard, but not much.   Patient reports that he has had L shoulder pain for a long time. Cannot say if he has had any previous injuries and does not think he has had imaging of shoulder. He expressed having sharp pain with movement of L shoulder from the shoulder to the elbow.    Pt accompanied by: Sister in-law.   PERTINENT HISTORY: CVA effecting L side in June 2025, Wernicke's aphasia, R shoulder pain with movement.   PAIN:  Are you having pain? 9/10, but has  difficulty describing.   PRECAUTIONS: Fall and Other: Decreased safety awareness.  RED FLAGS: None  WEIGHT BEARING RESTRICTIONS: No  FALLS: Has patient fallen in last 6 months? Yes. Number of falls Patient is unsure.   LIVING ENVIRONMENT: Lives with: lives with their family Lives in: House/apartment Stairs: Yes: External: 4 steps; on right going up and on left going up Has following equipment at home: None  PLOF: Independent  PATIENT GOALS: Improve L shoulder function/pain levels; Improve balance with gait/ADLs.   OBJECTIVE:  Note: Objective measures were completed at Evaluation unless otherwise noted.  DIAGNOSTIC FINDINGS:   COMPARISON:  CT head 09/06/2021.   FINDINGS: Brain: Encephalomalacia in the anterior right temporal and frontal lobes. Smaller area of encephalomalacia in the inferior left frontal lobe. Remote lacunar infarct in the posterior limb of the left internal capsule. No evidence of acute large vascular territory, acute hemorrhage, mass lesion or midline shift.  COGNITION: Overall cognitive status: Wernicke's Aphasia    SENSATION: Not tested  COORDINATION: Decreased speed with toe taps.     POSTURE: rounded shoulders, forward head, and increased thoracic kyphosis   LOWER EXTREMITY MMT:    MMT Right Eval Left Eval  Hip flexion 4 3+  Hip extension    Hip abduction 5 5  Hip adduction    Hip internal rotation    Hip external rotation    Knee flexion 5 4+  Knee extension 5 4  Ankle dorsiflexion 5 5  Ankle plantarflexion    Ankle inversion    Ankle eversion    (Blank rows = not tested)  UPPER EXTREMITY MMT:  MMT Right eval Left eval  Shoulder flexion 5 4-  Shoulder extension    Shoulder abduction 5 4  Shoulder adduction    Shoulder extension    Shoulder internal rotation 5 5  Shoulder external rotation 5 4  Middle trapezius    Lower trapezius    Elbow flexion    Elbow extension    Wrist flexion    Wrist extension    Wrist  ulnar deviation    Wrist radial deviation    Wrist pronation    Wrist supination    Grip strength     (Blank rows = not tested)   UPPER EXTREMITY ROM:  Active ROM Right eval Left eval  Shoulder flexion 155 108  Shoulder extension    Shoulder abduction 160 90  Shoulder adduction    Shoulder extension    Shoulder internal rotation [facility]  Shoulder external rotation 57 40  Elbow flexion    Elbow extension    Wrist flexion    Wrist extension    Wrist ulnar deviation    Wrist radial deviation    Wrist pronation    Wrist supination     (Blank rows = not tested)  BED MOBILITY:  Not tested  TRANSFERS: Sit to stand: Complete Independence and CGA  Assistive device utilized: Use of one upper extremity to assist trunk lift.      RAMP:  Not tested  CURB:  Not tested  STAIRS: Not tested GAIT: Findings: Gait Characteristics: decreased arm swing- Right, decreased arm swing- Left, decreased step length- Right, decreased step length- Left, and decreased stride length, Distance walked: 50', and Comments: .  FUNCTIONAL TESTS:  5 times sit to stand: 27.7'' Timed up and go (TUG): 65'' 10 meter walk test: 45''   PATIENT SURVEYS:  Unable to perform survey due to patient's cognition.  TREATMENT DATE: 03/06/24    Progress Note: See assessment and goals sections.   -5x STS test -TUG -10 MWT   L shoulder AROM:   -Flexion:140 degrees  -Abduction: 151 degrees  L shoulder MMT:   Flexion: 4/5MMT  Abduction: 4/5 MMT  IR: 5/5 MMT  ER: 5/5MMT   TE:  Seated shoulder flexion: 2x10 Seated shoulder abduction: 2x10 Seated biceps curl: 2x10   PATIENT EDUCATION: Education details: Patient educated on exercises to perform at home to improve L shoulder mobility and strength with proper mechanics/positioning. Person educated: Patient and sister  in-law Education method: Explanation, Demonstration, Verbal cues, and Handouts Education comprehension: returned demonstration  HOME EXERCISE PROGRAM:  Access Code: [email or web address] URL: [email or web address] Date: 01/22/2024 Prepared by: Norman Sharps  Exercises - Seated Shoulder External Rotation AAROM with Cane and Hand in Neutral  - 1 x daily - 7 x weekly - 1 sets - 15 reps - Seated Scapular Retraction  - 1 x daily - 7 x weekly - 1 sets - 15 reps - Supine Shoulder Flexion Extension AAROM with Dowel  - 1 x daily - 7 x weekly - 1 sets - 15 reps - Supine Shoulder Press with Dowel  - 1 x daily - 7 x weekly - 1 sets - 15 reps - Seated Shoulder Flexion AAROM with Dowel  - 1 x daily - 7 x weekly - 1 sets - 15 reps  GOALS: Goals reviewed with patient? Yes  SHORT TERM GOALS: Target date: 02/19/2024  Patient will be independent with HEP allowing him to perform exercises at home with proper positioning/mechanics/safety.  Baseline: HEP initiated at evaluation with handout.  Goal status: INITIAL    LONG TERM GOALS: Target date: 04/15/2024  1.  Patient (> 35 years old) will complete five times sit to stand test in < 15 seconds indicating an increased LE strength and improved balance. Baseline: 27.7'' 03/06/2024: 17.58''  Goal status: INITIAL  2.  Patient will improve gait mechanics showing good arm swing, foot clearance and increased stride length allowing him to ambulate more efficiently. Baseline: Decreased UE movement, lack of bilateral foot clearance during swing phase, decreased stride length, decreased gait speed. Goal status: INITIAL   3.  Patient will reduce timed up and go to <11 seconds to reduce fall risk and demonstrate improved transfer/gait ability. Baseline: 65'' without AD. 03/06/2024: 25.66'' without AD. Goal status: Progressing  4.  Patient will increase 10 meter walk test to >1.16m/s as to improve gait speed for better community ambulation and to reduce fall  risk. Baseline: 45'' Goal status: 15.45'' = 0.64 m/s without AD.   5.  Patient will improve L shouder flexion/abduction AROM to match that of the R by discharge allowing him to reach objects at greater heights such as in kitchen cabinets.  Baseline: Flexion - 108 degrees; Abduction - 90 degrees.  Goal status: INITIAL  6.  Patient will improve L shoulder strength to 4+/5 MMT by discharge allowing him to hold and carry items in L upper extremity with decreased difficulty.  Baseline: See MMT table.  Goal status: INITIAL   ASSESSMENT:  CLINICAL IMPRESSION:  Today was patient's 10th visit and a progress note was taken. Patient's caregiver (sister) was present to assist with communication. Patient's caregiver reports he has been improving at home with gait, walking faster and more often. He still has difficulty getting up from the recliner and chairs without arm rest. Patient does tend to lose balance from time to time, especially when turning around  real quick.   Objectively, patient demonstrated improvement today progressing with all goals. He showed improved gait with larger stride length and gait speed. TUG time and 10 MWT were improved with increased gait speed. He also showed improved balance with gait. Patient incresaed 5x STS time significantly compared to evaluation showing more efficiency with transfers. L shoulder AROM has improved in all planes, nearly meeting ROM goals. He does continue to have pain with elevation of L arm against gravity, but is performing the exercises with less difficulty. Patient does still have decreased gait speed, decreased balance, and gait deficits as he tends to shuffle the feet when turning. Overall, patient is making great improvement toward his deficits noted at evaluation with PT POC and would benefit from continuing skilled PT at this time.   OBJECTIVE IMPAIRMENTS: Abnormal gait, decreased activity tolerance, decreased balance, decreased coordination,  difficulty walking, decreased ROM, decreased strength, impaired UE functional use, and pain.   ACTIVITY LIMITATIONS: carrying, lifting, standing, squatting, stairs, and transfers  PARTICIPATION LIMITATIONS: meal prep, cleaning, laundry, interpersonal relationship, and yard work  PERSONAL FACTORS: Age, Past/current experiences, and 3+ comorbidities: Stroke, A -fib, PAD, HTN, hx. Of MI, COPD, blind in L eye. are also affecting patient's functional outcome.   REHAB POTENTIAL: Fair Due to patient's difficulty with communication.  CLINICAL DECISION MAKING: Evolving/moderate complexity  EVALUATION COMPLEXITY: Moderate  PLAN:  PT FREQUENCY: 1-2x/week  PT DURATION: 12 weeks  PLANNED INTERVENTIONS: [id number]- PT Re-evaluation, 97750- Physical Performance Testing, 97110-Therapeutic exercises, 97530- Therapeutic activity, [id number]- Neuromuscular re-education, 97535- Self Care, [id number]- Manual therapy, [phone]- Gait training, [phone]- Electrical stimulation (unattended), [phone]- Electrical stimulation (manual), Patient/Family education, Balance training, Stair training, Taping, Joint mobilization, Cryotherapy, and Moist heat  PLAN FOR NEXT SESSION:  -Continue working on gait with turns.  -Continue working to improve functional movement, gait, transfers, and L shoulder strength/mobility.  -Continue working on gait speed and step length.     Norman Sharps, PT, DPT Physical Therapist - [facility]  Norman KATHEE Sharps, [place] 03/06/2024, 2:13 PM        "

## 2024-03-07 ENCOUNTER — Ambulatory Visit: Admitting: Speech Pathology

## 2024-03-07 DIAGNOSIS — R4701 Aphasia: Secondary | ICD-10-CM

## 2024-03-07 NOTE — Therapy (Signed)
 " OUTPATIENT SPEECH LANGUAGE PATHOLOGY  SPEECH LANGUAGE TREATMENT NOTE    Patient Name: Gregory Lane MRN: [id number] DOB:1950-09-15, 74 y.o., male 48 Date: 03/07/2024   PCP: Denyse DELENA Bathe, MD REFERRING PROVIDER: Arland Earnie Pouch, MD     End of Session - 03/07/24 1324     Visit Number 45    Number of Visits 64    Date for Recertification  05/06/24    Authorization Type [employer] [place]    Progress Note Due on Visit 50    SLP Start Time 1015    SLP Stop Time  1100    SLP Time Calculation (min) 45 min    Activity Tolerance Patient tolerated treatment well          Past Medical History:  Diagnosis Date   Anxiety    Aortic atherosclerosis 02/16/2017   Arthritis    Blind left eye    COPD (chronic obstructive pulmonary disease) (HCC)    Depression    Dysrhythmia    A-fib   GERD (gastroesophageal reflux disease)    Hypertension    Low TSH level 02/17/2017   Myocardial infarction [facility])    PAD (peripheral artery disease)    Paroxysmal atrial fibrillation (HCC)    Perforated duodenal ulcer (HCC)    Stroke (HCC)    left side is weak   Past Surgical History:  Procedure Laterality Date   BRONCHIAL BIOPSY  03/22/2022   Procedure: BRONCHIAL BIOPSIES;  Surgeon: Isadora Hose, MD;  [facility]: [facility];  Service: Pulmonary;;   BRONCHIAL NEEDLE ASPIRATION BIOPSY  03/22/2022   Procedure: BRONCHIAL NEEDLE ASPIRATION BIOPSIES;  Surgeon: Isadora Hose, MD;  Location: [facility];  Service: Pulmonary;;   ENDOBRONCHIAL ULTRASOUND  03/22/2022   Procedure: ENDOBRONCHIAL ULTRASOUND;  Surgeon: Isadora Hose, MD;  Location: [facility];  Service: Pulmonary;;   EXPLORATORY LAPAROTOMY  05/12/2015   EYE SURGERY     FRACTURE SURGERY     LAPAROTOMY N/A 05/10/2015   Procedure: EXPLORATORY LAPAROTOMY;  Surgeon: Lynda Leos, MD;  Location: [facility] OR;  Service: General;  Laterality: N/A;   LEFT HEART CATH AND CORONARY ANGIOGRAPHY N/A 05/02/2016   Procedure: Left Heart Cath  and Coronary Angiography;  Surgeon: Gordy Bergamo, MD;  Location: [facility] INVASIVE CV LAB;  Service: Cardiovascular;  Laterality: N/A;   LOWER EXTREMITY ANGIOGRAPHY Left 09/21/2021   Procedure: Lower Extremity Angiography;  Surgeon: Jama Cordella KANDICE, MD;  Location: [facility] INVASIVE CV LAB;  Service: Cardiovascular;  Laterality: Left;   Patient Active Problem List   Diagnosis Date Noted   Aphasia 12/31/2023   Arthritis of left shoulder 12/31/2023   Allergic rhinitis due to pollen 12/11/2022   Tinea cruris 12/11/2022   BPH associated with nocturia 12/11/2022   Chest pain, non-cardiac 04/11/2022   Adenocarcinoma of lower lobe of right lung (HCC) 03/28/2022   Pulmonary nodule 1 cm or greater in diameter 03/22/2022   Leg weakness, bilateral 02/09/2022   Osteopenia 10/13/2021   PAD (peripheral artery disease) 09/21/2021   GERD without esophagitis 09/21/2021   Dyslipidemia 09/21/2021   Depression 09/21/2021   Atrial flutter (HCC) 09/21/2021   Chronic obstructive pulmonary disease (COPD) (HCC) 09/21/2021   Coronary artery disease 09/21/2021   Cerebrovascular accident (CVA) due to embolism of cerebral artery (HCC) 09/06/2021   Bilateral edema of lower extremity 08/05/2020   Vasovagal syncope 08/02/2020   Annual physical exam 12/26/2019   Hyperlipidemia 12/26/2019   Abrasion of right hand 12/26/2019   Metabolic syndrome 11/11/2019   Low TSH level  02/17/2017   Atrial flutter with rapid ventricular response (HCC) 02/16/2017   Cigarette smoker 02/16/2017   COPD with acute exacerbation (HCC) 02/16/2017   Elevated lactic acid level 02/16/2017   Aortic atherosclerosis 02/16/2017   Productive cough    Hypoxia 05/24/2015   SOB (shortness of breath) 05/24/2015   COPD suggested by initial evaluation 05/17/2015   Essential hypertension 05/17/2015   Smoking 05/17/2015    ONSET DATE: 08/09/2023   REFERRING DIAG: I63.9 (ICD-10-CM) - Cerebral infarction, unspecified   THERAPY DIAG:  Aphasia  Rationale  for Evaluation and Treatment Rehabilitation  SUBJECTIVE:   PERTINENT HISTORY and DIAGNOSTIC FINDINGS: Pt is a 74 year old right handed male who presented to [facility] ED on 08/09/2023 with stroke-like symptoms - slurred speech and difficulty answering questions and weakness to his right side extremities. CT head and subsequent CTA head and neck shows left M2 occlusion with large penumbra in the region. Pt transferred to [facility] for mechanical thrombectomy on 08/10/2023. Pt admitted at [facility] from 08/10/2023 thru 08/21/2023 with recommended for home health.   MRI brain: Remonstrated left MCA territory infarct primarily involving the parietal lobe.   Additional medical history includes: blindness left eye, A-fib on warfarin, COPD, HFpEF, HTN, current smoker, ethanol abuse, RLL adenocarcinoma s/p SBRT (2024)    PAIN:  Are you having pain? Unable to communicate  FALLS: Has patient fallen in last 6 months?  No  LIVING ENVIRONMENT: Lives with: sister-in-law Lives in: House/apartment  PLOF:  Level of assistance: Independent with ADLs, Independent with IADLs, Comment: poor health literacy, difficulty with reading and writing per family report Employment: Retired   PATIENT GOALS    per pt's sister-in-law, she hopes he will talk again  SUBJECTIVE STATEMENT: Pt pleasant, joking, increased off-the-cuff islands of intelligible speech Pt accompanied by: sister-in-law  OBJECTIVE:   TODAY'S TREATMENT:  Skilled ST treatment session targeted pt's aphasia and apraxia of speech goals. SLP facilitated session by providing the following interventions:  Western Aphasia Battery-Revised (WAB-R): Part 1  Spontaneous Speech:  Informational Content: 5/10 Fluency, Grammatical Content, and Paraphasias: 7/10 - c/b fluent neologisms  Auditory Verbal Comprehension:  Yes/No Questions: 51/60 Auditory Word Recognition: 52/60 Sequential Commands: 52/80  Repetition:  Single Words, Phrases, Sentences:  18/100  Naming and Word Finding: Object Naming: 26/60 Word Fluency: 3/20 Sentence Completion: 0/10 Responsive Speech: 4/10  Error types: Neologisms  Aphasia Quotient: 49.7 Aphasia Severity: Severe (26-50)  Aphasia Type: Wernicke's    PATIENT EDUCATION: Education details: see above Person educated: Patient and his sister-in-law Education method: Explanation Education comprehension: needs further education  HOME EXERCISE PROGRAM:   Supplement verbal information with single written words  GOALS:  Goals reviewed with patient? Yes  SHORT TERM GOALS: Target date: 10 sessions Updated 02/12/2024  Updated 12/25/2023   4. With minimal assistance, pt will point to 1 common object by function in a field of 3 with 90% accuracy.  Baseline:  Goal status: INITIAL: progress made: With minimal assistance, pt will point to 1 common object by function in a field of 3 with 80% accuracy in 3 out of 5 sessions.   MET  5. With minimal to moderate assistance, pt will point, in sequence to 2 common objects by function in field of 3 with 90% accuracy.   Baseline:  Goal status: INITIAL: progress made: With minimal to moderate assistance, pt will point, in sequence to 2 common objects by function in field of 3 with 80% accuracy in 3 out of 5 sessions.   MET  6. With minimal to moderate assistance pt will verablly approximate names of basic functional objects with > 75% speech intelligiblity at the word level.    Baseline:  Goal status: INITIAL: progress made, continue targeting: With minimal to moderate assistance pt with verablly approximate names of basic functional objects with > 75% speech intelligiblity at the word level in 3 out of 5 sessions.  PROGRESS MADE  9. With minimal assistance, pt will point, in sequence, to 2 common objects by name from a field of 3 with 80% accuracy across 3 consecutive sessions.  Baseline:  Goal status: INITIAL: PROGRESS MADE  LONG TERM GOALS: Target date:  05/06/2024  Updated: 02/12/2024 Updated 12/25/2023  1. Pt will use multimodal communication and maximal assistance to communicate basic wants and needs in 2 out of 5 opportunities per pt and family report.  Baseline:  Goal status: INITIAL: progress, continue targeting  2.  With Maximal A, patient/family will demonstrate understanding of the following concepts: aphasia, spontaneous recovery, communication vs conversation, strengths/strategies to promote success, local resources by answering multiple choice questions with 50% accuracy when provided supported conversation in order to increase patient and caregiver's participation in medical care.  Baseline:  Goal status: INITIAL: progress, continue targeting   ASSESSMENT:  CLINICAL IMPRESSION: Patient is a 74 y.o. right handed male who was seen today for a speech language treatment d/t left MCA CVA.   Pt presents with improving language impairment, now conceptualized as moderate to severe Wernicke's Aphasia (as per WAB-R, above).   Verbal Communication Pt's verbal communication continues to be fluent but increased number of islands of intelligible speech especially when commenting.   Auditory Comprehension Pt with improved auditory comprehension as evidenced by his ability to select objects in field of 4, answer basic to progressing semi-complex yes/no questions and following 1 step directions.   Written Language Pt with improved letter recognition and ability to copy written words.   Reading Comprehension Continues to be a strength for pt. Utilized to supplement receptive language deficits.   See the above treatment note for details.   OBJECTIVE IMPAIRMENTS include expressive language, receptive language, aphasia, apraxia, and written language. These impairments are limiting patient from managing medications, managing appointments, managing finances, household responsibilities, ADLs/IADLs, and effectively communicating at home and in  community. Factors affecting potential to achieve goals and functional outcome are severity of impairments, family/community support, and significantly reduced health literacy. Patient will benefit from skilled SLP services to address above impairments and improve overall function.  REHAB POTENTIAL: Good  PLAN: SLP FREQUENCY: 1-2x/week  SLP DURATION: 12 weeks  PLANNED INTERVENTIONS: Language facilitation, Cueing hierachy, Internal/external aids, Functional tasks, Multimodal communication approach, SLP instruction and feedback, Compensatory strategies, and Patient/family education    Liliani Bobo B. Rubbie, M.S., CCC-SLP, CBIS Speech-Language Pathologist Certified Brain Injury Specialist [facility]  [facility] [phone] [employer] [phone] Fax [phone]   "

## 2024-03-10 ENCOUNTER — Ambulatory Visit: Admitting: Cardiovascular Disease

## 2024-03-10 ENCOUNTER — Encounter: Payer: Self-pay | Admitting: Cardiovascular Disease

## 2024-03-10 VITALS — BP 140/72 | HR 59 | Ht 73.0 in | Wt 195.4 lb

## 2024-03-10 DIAGNOSIS — I4892 Unspecified atrial flutter: Secondary | ICD-10-CM | POA: Diagnosis not present

## 2024-03-10 DIAGNOSIS — I340 Nonrheumatic mitral (valve) insufficiency: Secondary | ICD-10-CM | POA: Diagnosis not present

## 2024-03-10 DIAGNOSIS — I2510 Atherosclerotic heart disease of native coronary artery without angina pectoris: Secondary | ICD-10-CM | POA: Diagnosis not present

## 2024-03-10 DIAGNOSIS — I351 Nonrheumatic aortic (valve) insufficiency: Secondary | ICD-10-CM

## 2024-03-10 DIAGNOSIS — I6340 Cerebral infarction due to embolism of unspecified cerebral artery: Secondary | ICD-10-CM | POA: Diagnosis not present

## 2024-03-10 DIAGNOSIS — I10 Essential (primary) hypertension: Secondary | ICD-10-CM | POA: Diagnosis not present

## 2024-03-10 DIAGNOSIS — Z8673 Personal history of transient ischemic attack (TIA), and cerebral infarction without residual deficits: Secondary | ICD-10-CM | POA: Diagnosis not present

## 2024-03-10 DIAGNOSIS — I739 Peripheral vascular disease, unspecified: Secondary | ICD-10-CM | POA: Diagnosis not present

## 2024-03-10 DIAGNOSIS — E782 Mixed hyperlipidemia: Secondary | ICD-10-CM

## 2024-03-10 DIAGNOSIS — R4701 Aphasia: Secondary | ICD-10-CM

## 2024-03-10 NOTE — Progress Notes (Signed)
 "     Cardiology Office Note   Date:  03/10/2024   ID:  MACGUIRE HOLSINGER, DOB 1950/07/07, MRN [id number]  PCP:  Fernand Denyse LABOR, MD  Cardiologist:  Denyse Fernand, MD      History of Present Illness: Gregory Lane is a 74 y.o. male who presents for  Chief Complaint  Patient presents with   Follow-up    Holter and echo follow up    Had stroke, in 6/25, getting PT.      Past Medical History:  Diagnosis Date   Anxiety    Aortic atherosclerosis 02/16/2017   Arthritis    Blind left eye    COPD (chronic obstructive pulmonary disease) (HCC)    Depression    Dysrhythmia    A-fib   GERD (gastroesophageal reflux disease)    Hypertension    Low TSH level 02/17/2017   Myocardial infarction [facility])    PAD (peripheral artery disease)    Paroxysmal atrial fibrillation (HCC)    Perforated duodenal ulcer (HCC)    Stroke (HCC)    left side is weak     Past Surgical History:  Procedure Laterality Date   BRONCHIAL BIOPSY  03/22/2022   Procedure: BRONCHIAL BIOPSIES;  Surgeon: Isadora Hose, MD;  [facility]: [facility];  Service: Pulmonary;;   BRONCHIAL NEEDLE ASPIRATION BIOPSY  03/22/2022   Procedure: BRONCHIAL NEEDLE ASPIRATION BIOPSIES;  Surgeon: Isadora Hose, MD;  [facility]: [facility];  Service: Pulmonary;;   ENDOBRONCHIAL ULTRASOUND  03/22/2022   Procedure: ENDOBRONCHIAL ULTRASOUND;  Surgeon: Isadora Hose, MD;  [facility]: [facility];  Service: Pulmonary;;   EXPLORATORY LAPAROTOMY  05/12/2015   EYE SURGERY     FRACTURE SURGERY     LAPAROTOMY N/A 05/10/2015   Procedure: EXPLORATORY LAPAROTOMY;  Surgeon: Lynda Leos, MD;  Location: [facility] OR;  Service: General;  Laterality: N/A;   LEFT HEART CATH AND CORONARY ANGIOGRAPHY N/A 05/02/2016   Procedure: Left Heart Cath and Coronary Angiography;  Surgeon: Gordy Bergamo, MD;  Location: [facility] INVASIVE CV LAB;  Service: Cardiovascular;  Laterality: N/A;   LOWER EXTREMITY ANGIOGRAPHY Left 09/21/2021   Procedure: Lower Extremity Angiography;   Surgeon: Jama Cordella KANDICE, MD;  Location: [facility] INVASIVE CV LAB;  Service: Cardiovascular;  Laterality: Left;     Current Outpatient Medications  Medication Sig Dispense Refill   apixaban  (ELIQUIS ) 5 MG TABS tablet Take 1 tablet (5 mg total) by mouth 2 (two) times daily. 60 tablet 3   atorvastatin  (LIPITOR) 40 MG tablet Take 1 tablet (40 mg total) by mouth daily. 90 tablet 3   celecoxib  (CELEBREX ) 200 MG capsule Take 1 capsule (200 mg total) by mouth 2 (two) times daily. 60 capsule 1   cetirizine  (ZYRTEC ) 10 MG tablet Take 1 tablet (10 mg total) by mouth daily. 90 tablet 1   diclofenac  Sodium (VOLTAREN ) 1 % GEL Apply 2 g topically 3 (three) times daily. Apply to left shoulder 200 g 1   FLUoxetine  (PROZAC ) 20 MG capsule TAKE 2 CAPSULES BY MOUTH EVERY DAY 180 capsule 0   furosemide  (LASIX ) 20 MG tablet Take 1 tablet (20 mg total) by mouth daily. 30 tablet 2   losartan  (COZAAR ) 50 MG tablet Take 1 tablet (50 mg total) by mouth 2 (two) times daily. 60 tablet 11   pantoprazole  (PROTONIX ) 40 MG tablet Take 1 tablet (40 mg total) by mouth 2 (two) times daily. 180 tablet 1   No current facility-administered medications for this visit.    Allergies:   Patient has no  known allergies.    Social History:   reports that he has quit smoking. His smoking use included cigarettes. He has a 32.5 pack-year smoking history. He has never used smokeless tobacco. He reports current alcohol use of about 60.0 standard drinks of alcohol per week. He reports that he does not use drugs.   Family History:  family history includes Arrhythmia in his sister; Arthritis in his sister; Cancer in his father; Cirrhosis in his father; Dementia in his mother; Heart murmur in his sister; Hypertension in his mother and sister; Lung cancer in his sister.    ROS:     Review of Systems  Constitutional: Negative.   HENT: Negative.    Eyes: Negative.   Respiratory: Negative.    Gastrointestinal: Negative.   Genitourinary:  Negative.   Musculoskeletal: Negative.   Skin: Negative.   Neurological: Negative.   Endo/Heme/Allergies: Negative.   Psychiatric/Behavioral: Negative.    All other systems reviewed and are negative.     All other systems are reviewed and negative.    PHYSICAL EXAM: VS:  BP (!) 140/72   Pulse (!) 59   Ht 6' 1 (1.854 m)   Wt 195 lb 6.4 oz (88.6 kg)   SpO2 98%   BMI 25.78 kg/m  , BMI Body mass index is 25.78 kg/m. Last weight:  Wt Readings from Last 3 Encounters:  03/10/24 195 lb 6.4 oz (88.6 kg)  02/08/24 195 lb 6.4 oz (88.6 kg)  02/04/24 195 lb 6.4 oz (88.6 kg)     Physical Exam Vitals reviewed.  Constitutional:      Appearance: Normal appearance. He is normal weight.  HENT:     Head: Normocephalic.     Nose: Nose normal.     Mouth/Throat:     Mouth: Mucous membranes are moist.  Eyes:     Pupils: Pupils are equal, round, and reactive to light.  Cardiovascular:     Rate and Rhythm: Normal rate and regular rhythm.     Pulses: Normal pulses.     Heart sounds: Normal heart sounds.  Pulmonary:     Effort: Pulmonary effort is normal.  Abdominal:     General: Abdomen is flat. Bowel sounds are normal.  Musculoskeletal:        General: Normal range of motion.     Cervical back: Normal range of motion.  Skin:    General: Skin is warm.  Neurological:     General: No focal deficit present.     Mental Status: He is alert.  Psychiatric:        Mood and Affect: Mood normal.       EKG:   Recent Labs: 12/31/2023: ALT 10; BUN 15; Creatinine, Ser 0.93; Hemoglobin 13.7; Platelets 198; Potassium 4.3; Sodium 137    Lipid Panel    Component Value Date/Time   CHOL 178 12/31/2023 1405   TRIG 61 12/31/2023 1405   HDL 44 12/31/2023 1405   CHOLHDL 4.0 12/31/2023 1405   CHOLHDL 5.1 (H) 12/19/2019 1114   VLDL 17 05/24/2015 2320   LDLCALC 122 (H) 12/31/2023 1405   LDLCALC 160 (H) 12/19/2019 1114      Other studies Reviewed: Additional studies/ records that were  reviewed today include:  Review of the above records demonstrates:      03/01/2022    2:50 PM  PAD Screen  Previous PAD dx? Yes  Previous surgical procedure? Yes  Dates of procedures 2014  Pain with walking? Yes  Subsides with rest? No  Feet/toe relief with dangling? No  Painful, non-healing ulcers? No  Extremities discolored? Yes      ASSESSMENT AND PLAN:    ICD-10-CM   1. Nonrheumatic mitral valve regurgitation  I34.0    moderate MR    2. Essential hypertension  I10    Systolic 140, but has dizzy spells, so will not llower it.    3. PAD (peripheral artery disease)  I73.9     4. History of stroke  Z86.73     5. Coronary artery disease involving native coronary artery of native heart without angina pectoris  I25.10     6. Atrial flutter, unspecified type (HCC)  I48.92     7. Mixed hyperlipidemia  E78.2     8. Cerebrovascular accident (CVA) due to embolism of cerebral artery (HCC)  I63.40     9. Aphasia  R47.01     10. Nonrheumatic aortic valve insufficiency  I35.1    mild AR, LVEF 52%, moderate MR       Problem List Items Addressed This Visit       Cardiovascular and Mediastinum   Essential hypertension   Cerebrovascular accident (CVA) due to embolism of cerebral artery (HCC)   PAD (peripheral artery disease)   Atrial flutter (HCC)   Coronary artery disease     Other   Hyperlipidemia   Aphasia   Other Visit Diagnoses       Nonrheumatic mitral valve regurgitation    -  Primary   moderate MR     History of stroke         Nonrheumatic aortic valve insufficiency       mild AR, LVEF 52%, moderate MR          Disposition:   Return in about 3 months (around 06/08/2024).    Total time spent: 30 minutes  Signed,  Denyse Bathe, MD  03/10/2024 11:15 AM    [facility] "

## 2024-03-11 ENCOUNTER — Ambulatory Visit: Admitting: Speech Pathology

## 2024-03-11 ENCOUNTER — Ambulatory Visit

## 2024-03-13 ENCOUNTER — Ambulatory Visit: Admitting: Speech Pathology

## 2024-03-13 ENCOUNTER — Ambulatory Visit

## 2024-03-13 DIAGNOSIS — R2681 Unsteadiness on feet: Secondary | ICD-10-CM

## 2024-03-13 DIAGNOSIS — M25512 Pain in left shoulder: Secondary | ICD-10-CM

## 2024-03-13 DIAGNOSIS — R41841 Cognitive communication deficit: Secondary | ICD-10-CM

## 2024-03-13 DIAGNOSIS — R4701 Aphasia: Secondary | ICD-10-CM

## 2024-03-13 DIAGNOSIS — R278 Other lack of coordination: Secondary | ICD-10-CM

## 2024-03-13 DIAGNOSIS — M6281 Muscle weakness (generalized): Secondary | ICD-10-CM

## 2024-03-13 DIAGNOSIS — R482 Apraxia: Secondary | ICD-10-CM

## 2024-03-13 NOTE — Therapy (Signed)
 " OUTPATIENT PHYSICAL THERAPY NEURO TREATMENT  Physical Therapy Progress Note   Dates of reporting period  01/22/2024   to   03/06/2024  Patient Name: Gregory Lane MRN: [id number] DOB:August 09, 1950, 74 y.o., male 87 Date: 03/13/2024   PCP: Fernand Denyse LABOR, MD  REFERRING PROVIDER: Albina GORMAN Dine, MD   END OF SESSION:  PT End of Session - 03/13/24 1147     Visit Number 11    Number of Visits 25    Date for Recertification  04/15/24    PT Start Time 1145    PT Stop Time 1229    PT Time Calculation (min) 44 min    Equipment Utilized During Treatment Gait belt    Activity Tolerance Patient tolerated treatment well    Behavior During Therapy [facility] for tasks assessed/performed            Past Medical History:  Diagnosis Date   Anxiety    Aortic atherosclerosis 02/16/2017   Arthritis    Blind left eye    COPD (chronic obstructive pulmonary disease) (HCC)    Depression    Dysrhythmia    A-fib   GERD (gastroesophageal reflux disease)    Hypertension    Low TSH level 02/17/2017   Myocardial infarction [facility])    PAD (peripheral artery disease)    Paroxysmal atrial fibrillation (HCC)    Perforated duodenal ulcer (HCC)    Stroke (HCC)    left side is weak   Past Surgical History:  Procedure Laterality Date   BRONCHIAL BIOPSY  03/22/2022   Procedure: BRONCHIAL BIOPSIES;  Surgeon: Isadora Hose, MD;  Location: [facility] ENDOSCOPY;  Service: Pulmonary;;   BRONCHIAL NEEDLE ASPIRATION BIOPSY  03/22/2022   Procedure: BRONCHIAL NEEDLE ASPIRATION BIOPSIES;  Surgeon: Isadora Hose, MD;  Location: [facility] ENDOSCOPY;  Service: Pulmonary;;   ENDOBRONCHIAL ULTRASOUND  03/22/2022   Procedure: ENDOBRONCHIAL ULTRASOUND;  Surgeon: Isadora Hose, MD;  Location: [facility] ENDOSCOPY;  Service: Pulmonary;;   EXPLORATORY LAPAROTOMY  05/12/2015   EYE SURGERY     FRACTURE SURGERY     LAPAROTOMY N/A 05/10/2015   Procedure: EXPLORATORY LAPAROTOMY;  Surgeon: Lynda Leos, MD;  Location: [facility] OR;   Service: General;  Laterality: N/A;   LEFT HEART CATH AND CORONARY ANGIOGRAPHY N/A 05/02/2016   Procedure: Left Heart Cath and Coronary Angiography;  Surgeon: Gordy Bergamo, MD;  Location: [facility] INVASIVE CV LAB;  Service: Cardiovascular;  Laterality: N/A;   LOWER EXTREMITY ANGIOGRAPHY Left 09/21/2021   Procedure: Lower Extremity Angiography;  Surgeon: Jama Cordella KANDICE, MD;  Location: [facility] INVASIVE CV LAB;  Service: Cardiovascular;  Laterality: Left;   Patient Active Problem List   Diagnosis Date Noted   Aphasia 12/31/2023   Arthritis of left shoulder 12/31/2023   Allergic rhinitis due to pollen 12/11/2022   Tinea cruris 12/11/2022   BPH associated with nocturia 12/11/2022   Chest pain, non-cardiac 04/11/2022   Adenocarcinoma of lower lobe of right lung (HCC) 03/28/2022   Pulmonary nodule 1 cm or greater in diameter 03/22/2022   Leg weakness, bilateral 02/09/2022   Osteopenia 10/13/2021   PAD (peripheral artery disease) 09/21/2021   GERD without esophagitis 09/21/2021   Dyslipidemia 09/21/2021   Depression 09/21/2021   Atrial flutter (HCC) 09/21/2021   Chronic obstructive pulmonary disease (COPD) (HCC) 09/21/2021   Coronary artery disease 09/21/2021   Cerebrovascular accident (CVA) due to embolism of cerebral artery (HCC) 09/06/2021   Bilateral edema of lower extremity 08/05/2020   Vasovagal syncope 08/02/2020   Annual physical exam 12/26/2019  Hyperlipidemia 12/26/2019   Abrasion of right hand 12/26/2019   Metabolic syndrome 11/11/2019   Low TSH level 02/17/2017   Atrial flutter with rapid ventricular response (HCC) 02/16/2017   Cigarette smoker 02/16/2017   COPD with acute exacerbation (HCC) 02/16/2017   Elevated lactic acid level 02/16/2017   Aortic atherosclerosis 02/16/2017   Productive cough    Hypoxia 05/24/2015   SOB (shortness of breath) 05/24/2015   COPD suggested by initial evaluation 05/17/2015   Essential hypertension 05/17/2015   Smoking 05/17/2015    ONSET DATE:  Unsure, a long time   REFERRING DIAG:  M19.012 (ICD-10-CM) - Arthritis of left shoulder  I63.40 (ICD-10-CM) - Cerebrovascular accident (CVA) due to embolism of cerebral artery (HCC)     THERAPY DIAG:  Aphasia  Unsteadiness on feet  Muscle weakness (generalized)  Left shoulder pain, unspecified chronicity  Other lack of coordination  Cognitive communication deficit  Apraxia  Rationale for Evaluation and Treatment: Rehabilitation  SUBJECTIVE:                                                                                                                                                                                             SUBJECTIVE STATEMENT:  Pt arrived with caregiver. Patient reports doing good with some pain in the L shoulder.    At Eval: Patient's sister in-law was present to assist with subjective portion of evaluation. Patient had CVA in June effecting R side. He did not have PT treatment following CVA initially due to focusing on speech first. Patient was noted to have damage to the Wernicke's Area causing aphasia making communication slightly difficult during today's evaluation. Patient walks without AD at home, but has decreased balance when walking. His sister in-law reports the R leg gave out last week and he fell in bedroom. He has also fallen in past when performing activities on uneven surfaces such as taking out garbage. He does do a little yard work such as picking up limbs and helping out in yard, but not much.   Patient reports that he has had L shoulder pain for a long time. Cannot say if he has had any previous injuries and does not think he has had imaging of shoulder. He expressed having sharp pain with movement of L shoulder from the shoulder to the elbow.    Pt accompanied by: Sister in-law.   PERTINENT HISTORY: CVA effecting L side in June 2025, Wernicke's aphasia, R shoulder pain with movement.   PAIN:  Are you having pain? 9/10, but has  difficulty describing.   PRECAUTIONS: Fall and Other: Decreased safety awareness.  RED FLAGS: None  WEIGHT BEARING RESTRICTIONS: No  FALLS: Has patient fallen in last 6 months? Yes. Number of falls Patient is unsure.   LIVING ENVIRONMENT: Lives with: lives with their family Lives in: House/apartment Stairs: Yes: External: 4 steps; on right going up and on left going up Has following equipment at home: None  PLOF: Independent  PATIENT GOALS: Improve L shoulder function/pain levels; Improve balance with gait/ADLs.   OBJECTIVE:  Note: Objective measures were completed at Evaluation unless otherwise noted.  DIAGNOSTIC FINDINGS:   COMPARISON:  CT head 09/06/2021.   FINDINGS: Brain: Encephalomalacia in the anterior right temporal and frontal lobes. Smaller area of encephalomalacia in the inferior left frontal lobe. Remote lacunar infarct in the posterior limb of the left internal capsule. No evidence of acute large vascular territory, acute hemorrhage, mass lesion or midline shift.  COGNITION: Overall cognitive status: Wernicke's Aphasia    SENSATION: Not tested  COORDINATION: Decreased speed with toe taps.     POSTURE: rounded shoulders, forward head, and increased thoracic kyphosis   LOWER EXTREMITY MMT:    MMT Right Eval Left Eval  Hip flexion 4 3+  Hip extension    Hip abduction 5 5  Hip adduction    Hip internal rotation    Hip external rotation    Knee flexion 5 4+  Knee extension 5 4  Ankle dorsiflexion 5 5  Ankle plantarflexion    Ankle inversion    Ankle eversion    (Blank rows = not tested)  UPPER EXTREMITY MMT:  MMT Right eval Left eval  Shoulder flexion 5 4-  Shoulder extension    Shoulder abduction 5 4  Shoulder adduction    Shoulder extension    Shoulder internal rotation 5 5  Shoulder external rotation 5 4  Middle trapezius    Lower trapezius    Elbow flexion    Elbow extension    Wrist flexion    Wrist extension    Wrist  ulnar deviation    Wrist radial deviation    Wrist pronation    Wrist supination    Grip strength     (Blank rows = not tested)   UPPER EXTREMITY ROM:  Active ROM Right eval Left eval  Shoulder flexion 155 108  Shoulder extension    Shoulder abduction 160 90  Shoulder adduction    Shoulder extension    Shoulder internal rotation [facility]  Shoulder external rotation 57 40  Elbow flexion    Elbow extension    Wrist flexion    Wrist extension    Wrist ulnar deviation    Wrist radial deviation    Wrist pronation    Wrist supination     (Blank rows = not tested)  BED MOBILITY:  Not tested  TRANSFERS: Sit to stand: Complete Independence and CGA  Assistive device utilized: Use of one upper extremity to assist trunk lift.      RAMP:  Not tested  CURB:  Not tested  STAIRS: Not tested GAIT: Findings: Gait Characteristics: decreased arm swing- Right, decreased arm swing- Left, decreased step length- Right, decreased step length- Left, and decreased stride length, Distance walked: 50', and Comments: .  FUNCTIONAL TESTS:  5 times sit to stand: 27.7'' Timed up and go (TUG): 65'' 10 meter walk test: 45''   PATIENT SURVEYS:  Unable to perform survey due to patient's cognition.  TREATMENT DATE: 03/13/24    -Sit to stands while holding 3 kg ball 10x  -Seated L shoulder flexion:   -Seated L shoulder abduction:   -Sit to stands while holding 3 kg ball 10x.  -Seated rows: GTB 3x10  -Seated Reverse Fly: GTB 3x10  -Sit to stands while holding 3 kg ball 10x   -Obstacle course walking over 3 1/2 rolls and weaving around 5 cones.   -Agility ladder focusing on improving step length x 3 laps forward  -Side stepping into/out of one agility ladder square. X15     PATIENT EDUCATION: Education details: Patient educated on exercises to perform at  home to improve L shoulder mobility and strength with proper mechanics/positioning. Person educated: Patient and sister in-law Education method: Explanation, Demonstration, Verbal cues, and Handouts Education comprehension: returned demonstration  HOME EXERCISE PROGRAM:  Access Code: [email or web address] URL: [email or web address] Date: 01/22/2024 Prepared by: Norman Sharps  Exercises - Seated Shoulder External Rotation AAROM with Cane and Hand in Neutral  - 1 x daily - 7 x weekly - 1 sets - 15 reps - Seated Scapular Retraction  - 1 x daily - 7 x weekly - 1 sets - 15 reps - Supine Shoulder Flexion Extension AAROM with Dowel  - 1 x daily - 7 x weekly - 1 sets - 15 reps - Supine Shoulder Press with Dowel  - 1 x daily - 7 x weekly - 1 sets - 15 reps - Seated Shoulder Flexion AAROM with Dowel  - 1 x daily - 7 x weekly - 1 sets - 15 reps  GOALS: Goals reviewed with patient? Yes  SHORT TERM GOALS: Target date: 02/19/2024  Patient will be independent with HEP allowing him to perform exercises at home with proper positioning/mechanics/safety.  Baseline: HEP initiated at evaluation with handout.  Goal status: INITIAL    LONG TERM GOALS: Target date: 04/15/2024  1.  Patient (> 63 years old) will complete five times sit to stand test in < 15 seconds indicating an increased LE strength and improved balance. Baseline: 27.7'' 03/06/2024: 17.58''  Goal status: INITIAL  2.  Patient will improve gait mechanics showing good arm swing, foot clearance and increased stride length allowing him to ambulate more efficiently. Baseline: Decreased UE movement, lack of bilateral foot clearance during swing phase, decreased stride length, decreased gait speed. Goal status: INITIAL   3.  Patient will reduce timed up and go to <11 seconds to reduce fall risk and demonstrate improved transfer/gait ability. Baseline: 65'' without AD. 03/06/2024: 25.66'' without AD. Goal status: Progressing  4.  Patient  will increase 10 meter walk test to >1.61m/s as to improve gait speed for better community ambulation and to reduce fall risk. Baseline: 45'' Goal status: 15.45'' = 0.64 m/s without AD.   5.  Patient will improve L shouder flexion/abduction AROM to match that of the R by discharge allowing him to reach objects at greater heights such as in kitchen cabinets.  Baseline: Flexion - 108 degrees; Abduction - 90 degrees.  Goal status: INITIAL  6.  Patient will improve L shoulder strength to 4+/5 MMT by discharge allowing him to hold and carry items in L upper extremity with decreased difficulty.  Baseline: See MMT table.  Goal status: INITIAL   ASSESSMENT:  CLINICAL IMPRESSION:  Patient focused on both improving the L shoulder ROM/strength today while also improving functional gait and transfers. He continued with AROM in seated position showing slight decrease in L shoulder ROM, but still improved compared to  evaluation. He also worked on walking over obstacles and between obstacles today allowing him to do so at home with decreased difficulty and increased safety. I also had patient work on step length both forward and laterally using agility ladder for visual cuing. He did improve well with lateral stepping, but continued to have difficulty with larger forward steps even with cuing. Overall, patient will benefit from continuing skilled PT at this time.   OBJECTIVE IMPAIRMENTS: Abnormal gait, decreased activity tolerance, decreased balance, decreased coordination, difficulty walking, decreased ROM, decreased strength, impaired UE functional use, and pain.   ACTIVITY LIMITATIONS: carrying, lifting, standing, squatting, stairs, and transfers  PARTICIPATION LIMITATIONS: meal prep, cleaning, laundry, interpersonal relationship, and yard work  PERSONAL FACTORS: Age, Past/current experiences, and 3+ comorbidities: Stroke, A -fib, PAD, HTN, hx. Of MI, COPD, blind in L eye. are also affecting patient's  functional outcome.   REHAB POTENTIAL: Fair Due to patient's difficulty with communication.  CLINICAL DECISION MAKING: Evolving/moderate complexity  EVALUATION COMPLEXITY: Moderate  PLAN:  PT FREQUENCY: 1-2x/week  PT DURATION: 12 weeks  PLANNED INTERVENTIONS: 97164- PT Re-evaluation, 97750- Physical Performance Testing, 97110-Therapeutic exercises, 97530- Therapeutic activity, [id number]- Neuromuscular re-education, 97535- Self Care, [id number]- Manual therapy, [phone]- Gait training, [phone]- Electrical stimulation (unattended), [phone]- Electrical stimulation (manual), Patient/Family education, Balance training, Stair training, Taping, Joint mobilization, Cryotherapy, and Moist heat  PLAN FOR NEXT SESSION:  -Continue working on gait with turns.  -Continue working to improve functional movement, gait, transfers, and L shoulder strength/mobility.  -Continue working on gait speed and step length.     Norman Sharps, PT, DPT Physical Therapist - [facility]  Norman KATHEE Sharps, [place] 03/13/2024, 1:03 PM        "

## 2024-03-13 NOTE — Therapy (Signed)
 " OUTPATIENT SPEECH LANGUAGE PATHOLOGY  SPEECH LANGUAGE TREATMENT NOTE    Patient Name: Gregory Lane MRN: [id number] DOB:Mar 27, 1950, 74 y.o., male 24 Date: 03/13/2024   PCP: Denyse DELENA Bathe, MD REFERRING PROVIDER: Arland Earnie Pouch, MD     End of Session - 03/13/24 1106     Visit Number 46    Number of Visits 64    Date for Recertification  05/06/24    Authorization Type [employer] [facility] [place]    Progress Note Due on Visit 50    SLP Start Time 1100    SLP Stop Time  1145    SLP Time Calculation (min) 45 min    Activity Tolerance Patient tolerated treatment well          Past Medical History:  Diagnosis Date   Anxiety    Aortic atherosclerosis 02/16/2017   Arthritis    Blind left eye    COPD (chronic obstructive pulmonary disease) (HCC)    Depression    Dysrhythmia    A-fib   GERD (gastroesophageal reflux disease)    Hypertension    Low TSH level 02/17/2017   Myocardial infarction [facility])    PAD (peripheral artery disease)    Paroxysmal atrial fibrillation (HCC)    Perforated duodenal ulcer (HCC)    Stroke (HCC)    left side is weak   Past Surgical History:  Procedure Laterality Date   BRONCHIAL BIOPSY  03/22/2022   Procedure: BRONCHIAL BIOPSIES;  Surgeon: Isadora Hose, MD;  [facility]: [facility];  Service: Pulmonary;;   BRONCHIAL NEEDLE ASPIRATION BIOPSY  03/22/2022   Procedure: BRONCHIAL NEEDLE ASPIRATION BIOPSIES;  Surgeon: Isadora Hose, MD;  Location: [facility];  Service: Pulmonary;;   ENDOBRONCHIAL ULTRASOUND  03/22/2022   Procedure: ENDOBRONCHIAL ULTRASOUND;  Surgeon: Isadora Hose, MD;  Location: [facility];  Service: Pulmonary;;   EXPLORATORY LAPAROTOMY  05/12/2015   EYE SURGERY     FRACTURE SURGERY     LAPAROTOMY N/A 05/10/2015   Procedure: EXPLORATORY LAPAROTOMY;  Surgeon: Lynda Leos, MD;  Location: [facility] OR;  Service: General;  Laterality: N/A;   LEFT HEART CATH AND CORONARY ANGIOGRAPHY N/A 05/02/2016   Procedure: Left Heart Cath  and Coronary Angiography;  Surgeon: Gordy Bergamo, MD;  Location: [facility] INVASIVE CV LAB;  Service: Cardiovascular;  Laterality: N/A;   LOWER EXTREMITY ANGIOGRAPHY Left 09/21/2021   Procedure: Lower Extremity Angiography;  Surgeon: Jama Cordella KANDICE, MD;  Location: [facility] INVASIVE CV LAB;  Service: Cardiovascular;  Laterality: Left;   Patient Active Problem List   Diagnosis Date Noted   Aphasia 12/31/2023   Arthritis of left shoulder 12/31/2023   Allergic rhinitis due to pollen 12/11/2022   Tinea cruris 12/11/2022   BPH associated with nocturia 12/11/2022   Chest pain, non-cardiac 04/11/2022   Adenocarcinoma of lower lobe of right lung (HCC) 03/28/2022   Pulmonary nodule 1 cm or greater in diameter 03/22/2022   Leg weakness, bilateral 02/09/2022   Osteopenia 10/13/2021   PAD (peripheral artery disease) 09/21/2021   GERD without esophagitis 09/21/2021   Dyslipidemia 09/21/2021   Depression 09/21/2021   Atrial flutter (HCC) 09/21/2021   Chronic obstructive pulmonary disease (COPD) (HCC) 09/21/2021   Coronary artery disease 09/21/2021   Cerebrovascular accident (CVA) due to embolism of cerebral artery (HCC) 09/06/2021   Bilateral edema of lower extremity 08/05/2020   Vasovagal syncope 08/02/2020   Annual physical exam 12/26/2019   Hyperlipidemia 12/26/2019   Abrasion of right hand 12/26/2019   Metabolic syndrome 11/11/2019   Low TSH level  02/17/2017   Atrial flutter with rapid ventricular response (HCC) 02/16/2017   Cigarette smoker 02/16/2017   COPD with acute exacerbation (HCC) 02/16/2017   Elevated lactic acid level 02/16/2017   Aortic atherosclerosis 02/16/2017   Productive cough    Hypoxia 05/24/2015   SOB (shortness of breath) 05/24/2015   COPD suggested by initial evaluation 05/17/2015   Essential hypertension 05/17/2015   Smoking 05/17/2015    ONSET DATE: 08/09/2023   REFERRING DIAG: I63.9 (ICD-10-CM) - Cerebral infarction, unspecified   THERAPY DIAG:  Aphasia  Rationale  for Evaluation and Treatment Rehabilitation  SUBJECTIVE:   PERTINENT HISTORY and DIAGNOSTIC FINDINGS: Pt is a 74 year old right handed male who presented to [facility] ED on 08/09/2023 with stroke-like symptoms - slurred speech and difficulty answering questions and weakness to his right side extremities. CT head and subsequent CTA head and neck shows left M2 occlusion with large penumbra in the region. Pt transferred to [facility] for mechanical thrombectomy on 08/10/2023. Pt admitted at [facility] from 08/10/2023 thru 08/21/2023 with recommended for home health.   MRI brain: Remonstrated left MCA territory infarct primarily involving the parietal lobe.   Additional medical history includes: blindness left eye, A-fib on warfarin, COPD, HFpEF, HTN, current smoker, ethanol abuse, RLL adenocarcinoma s/p SBRT (2024)    PAIN:  Are you having pain? Unable to communicate  FALLS: Has patient fallen in last 6 months?  No  LIVING ENVIRONMENT: Lives with: sister-in-law Lives in: House/apartment  PLOF:  Level of assistance: Independent with ADLs, Independent with IADLs, Comment: poor health literacy, difficulty with reading and writing per family report Employment: Retired   PATIENT GOALS    per pt's sister-in-law, she hopes he will talk again  SUBJECTIVE STATEMENT: Pt pleasant, missed session earlier this week d/t not feeling well Pt accompanied by: sister-in-law  OBJECTIVE:   TODAY'S TREATMENT:  Skilled ST treatment session targeted pt's aphasia and apraxia of speech goals. SLP facilitated session by providing the following interventions:  To increase comprehension skills, pictures of common foods items that pt likes were paired with their words and presented in the following order: SLP verbally presented each picture with written word. Pt then said each word with the clinician. Pt with improved speech intelligibility when imitating previously targeted words (>90%) and ~ 50% with new sets of words. Pt with  intermittent awareness of errors but unable to improve.   PATIENT EDUCATION: Education details: see above Person educated: Patient and his sister-in-law Education method: Explanation Education comprehension: needs further education  HOME EXERCISE PROGRAM:   Supplement verbal information with single written words  Continue practicing with pictures and written cards  GOALS:  Goals reviewed with patient? Yes  SHORT TERM GOALS: Target date: 10 sessions Updated 02/12/2024  Updated 12/25/2023   4. With minimal assistance, pt will point to 1 common object by function in a field of 3 with 90% accuracy.  Baseline:  Goal status: INITIAL: progress made: With minimal assistance, pt will point to 1 common object by function in a field of 3 with 80% accuracy in 3 out of 5 sessions.   MET  5. With minimal to moderate assistance, pt will point, in sequence to 2 common objects by function in field of 3 with 90% accuracy.   Baseline:  Goal status: INITIAL: progress made: With minimal to moderate assistance, pt will point, in sequence to 2 common objects by function in field of 3 with 80% accuracy in 3 out of 5 sessions.   MET  6.  With minimal to moderate assistance pt will verablly approximate names of basic functional objects with > 75% speech intelligiblity at the word level.    Baseline:  Goal status: INITIAL: progress made, continue targeting: With minimal to moderate assistance pt with verablly approximate names of basic functional objects with > 75% speech intelligiblity at the word level in 3 out of 5 sessions.  PROGRESS MADE  9. With minimal assistance, pt will point, in sequence, to 2 common objects by name from a field of 3 with 80% accuracy across 3 consecutive sessions.  Baseline:  Goal status: INITIAL: PROGRESS MADE  LONG TERM GOALS: Target date: 05/06/2024  Updated: 02/12/2024 Updated 12/25/2023  1. Pt will use multimodal communication and maximal assistance to communicate basic  wants and needs in 2 out of 5 opportunities per pt and family report.  Baseline:  Goal status: INITIAL: progress, continue targeting  2.  With Maximal A, patient/family will demonstrate understanding of the following concepts: aphasia, spontaneous recovery, communication vs conversation, strengths/strategies to promote success, local resources by answering multiple choice questions with 50% accuracy when provided supported conversation in order to increase patient and caregiver's participation in medical care.  Baseline:  Goal status: INITIAL: progress, continue targeting   ASSESSMENT:  CLINICAL IMPRESSION: Patient is a 74 y.o. right handed male who was seen today for a speech language treatment d/t left MCA CVA.   Pt presents with improving language impairment, now conceptualized as moderate to severe Wernicke's Aphasia (as per WAB-R, above).   Verbal Communication Pt's verbal communication continues to be fluent but increased number of islands of intelligible speech especially when commenting.   Auditory Comprehension Pt with improved auditory comprehension as evidenced by his ability to select objects in field of 4, answer basic to progressing semi-complex yes/no questions and following 1 step directions.   Written Language Pt with improved letter recognition and ability to copy written words.   Reading Comprehension Continues to be a strength for pt. Utilized to supplement receptive language deficits.   See the above treatment note for details.   OBJECTIVE IMPAIRMENTS include expressive language, receptive language, aphasia, apraxia, and written language. These impairments are limiting patient from managing medications, managing appointments, managing finances, household responsibilities, ADLs/IADLs, and effectively communicating at home and in community. Factors affecting potential to achieve goals and functional outcome are severity of impairments, family/community support, and  significantly reduced health literacy. Patient will benefit from skilled SLP services to address above impairments and improve overall function.  REHAB POTENTIAL: Good  PLAN: SLP FREQUENCY: 1-2x/week  SLP DURATION: 12 weeks  PLANNED INTERVENTIONS: Language facilitation, Cueing hierachy, Internal/external aids, Functional tasks, Multimodal communication approach, SLP instruction and feedback, Compensatory strategies, and Patient/family education    Delayza Lungren B. Rubbie, M.S., CCC-SLP, CBIS Speech-Language Pathologist Certified Brain Injury Specialist [facility]  [facility] [phone] Ascom [phone] Fax [phone]   "

## 2024-03-18 ENCOUNTER — Ambulatory Visit: Admitting: Speech Pathology

## 2024-03-18 ENCOUNTER — Ambulatory Visit

## 2024-03-18 DIAGNOSIS — R4701 Aphasia: Secondary | ICD-10-CM

## 2024-03-18 DIAGNOSIS — M25512 Pain in left shoulder: Secondary | ICD-10-CM

## 2024-03-18 DIAGNOSIS — R2681 Unsteadiness on feet: Secondary | ICD-10-CM

## 2024-03-18 DIAGNOSIS — R41841 Cognitive communication deficit: Secondary | ICD-10-CM

## 2024-03-18 DIAGNOSIS — R278 Other lack of coordination: Secondary | ICD-10-CM

## 2024-03-18 DIAGNOSIS — M6281 Muscle weakness (generalized): Secondary | ICD-10-CM

## 2024-03-18 DIAGNOSIS — R482 Apraxia: Secondary | ICD-10-CM

## 2024-03-18 NOTE — Therapy (Signed)
 " OUTPATIENT SPEECH LANGUAGE PATHOLOGY  SPEECH LANGUAGE TREATMENT NOTE    Patient Name: Gregory Lane MRN: [id number] DOB:24-Jan-1951, 74 y.o., male 59 Date: 03/18/2024   PCP: Denyse DELENA Bathe, MD REFERRING PROVIDER: Arland Earnie Pouch, MD     End of Session - 03/18/24 1147     Visit Number 47    Number of Visits 64    Date for Recertification  05/06/24    Authorization Type [employer] [place]    Progress Note Due on Visit 50    SLP Start Time 1145    SLP Stop Time  1230    SLP Time Calculation (min) 45 min    Activity Tolerance Patient tolerated treatment well          Past Medical History:  Diagnosis Date   Anxiety    Aortic atherosclerosis 02/16/2017   Arthritis    Blind left eye    COPD (chronic obstructive pulmonary disease) (HCC)    Depression    Dysrhythmia    A-fib   GERD (gastroesophageal reflux disease)    Hypertension    Low TSH level 02/17/2017   Myocardial infarction [facility]    PAD (peripheral artery disease)    Paroxysmal atrial fibrillation (HCC)    Perforated duodenal ulcer (HCC)    Stroke (HCC)    left side is weak   Past Surgical History:  Procedure Laterality Date   BRONCHIAL BIOPSY  03/22/2022   Procedure: BRONCHIAL BIOPSIES;  Surgeon: Isadora Hose, MD;  [facility];  Service: Pulmonary;;   BRONCHIAL NEEDLE ASPIRATION BIOPSY  03/22/2022   Procedure: BRONCHIAL NEEDLE ASPIRATION BIOPSIES;  Surgeon: Isadora Hose, MD;  [facility]: [facility];  Service: Pulmonary;;   ENDOBRONCHIAL ULTRASOUND  03/22/2022   Procedure: ENDOBRONCHIAL ULTRASOUND;  Surgeon: Isadora Hose, MD;  [facility]: [facility];  Service: Pulmonary;;   EXPLORATORY LAPAROTOMY  05/12/2015   EYE SURGERY     FRACTURE SURGERY     LAPAROTOMY N/A 05/10/2015   Procedure: EXPLORATORY LAPAROTOMY;  Surgeon: Lynda Leos, MD;  Location: [facility];  Service: General;  Laterality: N/A;   LEFT HEART CATH AND CORONARY ANGIOGRAPHY N/A 05/02/2016   Procedure: Left Heart Cath  and Coronary Angiography;  Surgeon: Gordy Bergamo, MD;  Location: [facility] INVASIVE CV LAB;  Service: Cardiovascular;  Laterality: N/A;   LOWER EXTREMITY ANGIOGRAPHY Left 09/21/2021   Procedure: Lower Extremity Angiography;  Surgeon: Jama Cordella KANDICE, MD;  Location: [facility] INVASIVE CV LAB;  Service: Cardiovascular;  Laterality: Left;   Patient Active Problem List   Diagnosis Date Noted   Aphasia 12/31/2023   Arthritis of left shoulder 12/31/2023   Allergic rhinitis due to pollen 12/11/2022   Tinea cruris 12/11/2022   BPH associated with nocturia 12/11/2022   Chest pain, non-cardiac 04/11/2022   Adenocarcinoma of lower lobe of right lung (HCC) 03/28/2022   Pulmonary nodule 1 cm or greater in diameter 03/22/2022   Leg weakness, bilateral 02/09/2022   Osteopenia 10/13/2021   PAD (peripheral artery disease) 09/21/2021   GERD without esophagitis 09/21/2021   Dyslipidemia 09/21/2021   Depression 09/21/2021   Atrial flutter (HCC) 09/21/2021   Chronic obstructive pulmonary disease (COPD) (HCC) 09/21/2021   Coronary artery disease 09/21/2021   Cerebrovascular accident (CVA) due to embolism of cerebral artery (HCC) 09/06/2021   Bilateral edema of lower extremity 08/05/2020   Vasovagal syncope 08/02/2020   Annual physical exam 12/26/2019   Hyperlipidemia 12/26/2019   Abrasion of right hand 12/26/2019   Metabolic syndrome 11/11/2019   Low TSH level  02/17/2017   Atrial flutter with rapid ventricular response (HCC) 02/16/2017   Cigarette smoker 02/16/2017   COPD with acute exacerbation (HCC) 02/16/2017   Elevated lactic acid level 02/16/2017   Aortic atherosclerosis 02/16/2017   Productive cough    Hypoxia 05/24/2015   SOB (shortness of breath) 05/24/2015   COPD suggested by initial evaluation 05/17/2015   Essential hypertension 05/17/2015   Smoking 05/17/2015    ONSET DATE: 08/09/2023   REFERRING DIAG: I63.9 (ICD-10-CM) - Cerebral infarction, unspecified   THERAPY DIAG:  Aphasia  Rationale  for Evaluation and Treatment Rehabilitation  SUBJECTIVE:   PERTINENT HISTORY and DIAGNOSTIC FINDINGS: Pt is a 74 year old right handed male who presented to [facility] ED on 08/09/2023 with stroke-like symptoms - slurred speech and difficulty answering questions and weakness to his right side extremities. CT head and subsequent CTA head and neck shows left M2 occlusion with large penumbra in the region. Pt transferred to [facility] for mechanical thrombectomy on 08/10/2023. Pt admitted at [facility] from 08/10/2023 thru 08/21/2023 with recommended for home health.   MRI brain: Remonstrated left MCA territory infarct primarily involving the parietal lobe.   Additional medical history includes: blindness left eye, A-fib on warfarin, COPD, HFpEF, HTN, current smoker, ethanol abuse, RLL adenocarcinoma s/p SBRT (2024)    PAIN:  Are you having pain? Unable to communicate  FALLS: Has patient fallen in last 6 months?  No  LIVING ENVIRONMENT: Lives with: sister-in-law Lives in: House/apartment  PLOF:  Level of assistance: Independent with ADLs, Independent with IADLs, Comment: poor health literacy, difficulty with reading and writing per family report Employment: Retired   PATIENT GOALS    per pt's sister-in-law, she hopes he will talk again  SUBJECTIVE STATEMENT: Pt pleasant, missed session earlier this week d/t not feeling well Pt accompanied by: sister-in-law  OBJECTIVE:   TODAY'S TREATMENT:  Skilled ST treatment session targeted pt's aphasia and apraxia of speech goals. SLP facilitated session by providing the following interventions:  To increase comprehension skills, pictures of common foods items that pt likes were paired with their words and presented in the following order: SLP verbally presented each picture with written word. Pt then said each word with the clinician intelligibility in 27 out of 40 opportunities. Pt then presented with object picture and independently produced 23 out of 40 words  intelligibly.   PATIENT EDUCATION: Education details: see above Person educated: Patient and his sister-in-law Education method: Explanation Education comprehension: needs further education  HOME EXERCISE PROGRAM:   Supplement verbal information with single written words  Continue practicing with pictures and written cards  GOALS:  Goals reviewed with patient? Yes  SHORT TERM GOALS: Target date: 10 sessions Updated 02/12/2024  Updated 12/25/2023   4. With minimal assistance, pt will point to 1 common object by function in a field of 3 with 90% accuracy.  Baseline:  Goal status: INITIAL: progress made: With minimal assistance, pt will point to 1 common object by function in a field of 3 with 80% accuracy in 3 out of 5 sessions.   MET  5. With minimal to moderate assistance, pt will point, in sequence to 2 common objects by function in field of 3 with 90% accuracy.   Baseline:  Goal status: INITIAL: progress made: With minimal to moderate assistance, pt will point, in sequence to 2 common objects by function in field of 3 with 80% accuracy in 3 out of 5 sessions.   MET  6. With minimal to moderate assistance pt will  verablly approximate names of basic functional objects with > 75% speech intelligiblity at the word level.    Baseline:  Goal status: INITIAL: progress made, continue targeting: With minimal to moderate assistance pt with verablly approximate names of basic functional objects with > 75% speech intelligiblity at the word level in 3 out of 5 sessions.  PROGRESS MADE  9. With minimal assistance, pt will point, in sequence, to 2 common objects by name from a field of 3 with 80% accuracy across 3 consecutive sessions.  Baseline:  Goal status: INITIAL: PROGRESS MADE  LONG TERM GOALS: Target date: 05/06/2024  Updated: 02/12/2024 Updated 12/25/2023  1. Pt will use multimodal communication and maximal assistance to communicate basic wants and needs in 2 out of 5 opportunities  per pt and family report.  Baseline:  Goal status: INITIAL: progress, continue targeting  2.  With Maximal A, patient/family will demonstrate understanding of the following concepts: aphasia, spontaneous recovery, communication vs conversation, strengths/strategies to promote success, local resources by answering multiple choice questions with 50% accuracy when provided supported conversation in order to increase patient and caregiver's participation in medical care.  Baseline:  Goal status: INITIAL: progress, continue targeting   ASSESSMENT:  CLINICAL IMPRESSION: Patient is a 74 y.o. right handed male who was seen today for a speech language treatment d/t left MCA CVA.   Pt presents with improving language impairment, now conceptualized as moderate to severe Wernicke's Aphasia (as per WAB-R, above).   Verbal Communication Pt's verbal communication continues to be fluent but increased number of islands of intelligible speech especially when commenting.   Auditory Comprehension Pt with improved auditory comprehension as evidenced by his ability to select objects in field of 4, answer basic to progressing semi-complex yes/no questions and following 1 step directions.   Written Language Pt with improved letter recognition and ability to copy written words.   Reading Comprehension Continues to be a strength for pt. Utilized to supplement receptive language deficits.   See the above treatment note for details.   OBJECTIVE IMPAIRMENTS include expressive language, receptive language, aphasia, apraxia, and written language. These impairments are limiting patient from managing medications, managing appointments, managing finances, household responsibilities, ADLs/IADLs, and effectively communicating at home and in community. Factors affecting potential to achieve goals and functional outcome are severity of impairments, family/community support, and significantly reduced health literacy.  Patient will benefit from skilled SLP services to address above impairments and improve overall function.  REHAB POTENTIAL: Good  PLAN: SLP FREQUENCY: 1-2x/week  SLP DURATION: 12 weeks  PLANNED INTERVENTIONS: Language facilitation, Cueing hierachy, Internal/external aids, Functional tasks, Multimodal communication approach, SLP instruction and feedback, Compensatory strategies, and Patient/family education    Rufus Cypert B. Rubbie, M.S., CCC-SLP, CBIS Speech-Language Pathologist Certified Brain Injury Specialist [facility]  [facility] [phone] Ascom [phone] Fax [phone]   "

## 2024-03-18 NOTE — Therapy (Signed)
 " OUTPATIENT PHYSICAL THERAPY NEURO TREATMENT  Physical Therapy Progress Note   Dates of reporting period  01/22/2024   to   03/06/2024  Patient Name: Gregory Lane MRN: [id number] DOB:December 20, 1950, 74 y.o., male 74 Date: 03/18/2024   PCP: Fernand Denyse LABOR, MD  REFERRING PROVIDER: Albina GORMAN Dine, MD   END OF SESSION:  PT End of Session - 03/18/24 1104     Visit Number 12    Number of Visits 25    Date for Recertification  04/15/24    PT Start Time 1105    PT Stop Time 1145    PT Time Calculation (min) 40 min    Equipment Utilized During Treatment Gait belt    Activity Tolerance Patient tolerated treatment well    Behavior During Therapy [facility] for tasks assessed/performed            Past Medical History:  Diagnosis Date   Anxiety    Aortic atherosclerosis 02/16/2017   Arthritis    Blind left eye    COPD (chronic obstructive pulmonary disease) (HCC)    Depression    Dysrhythmia    A-fib   GERD (gastroesophageal reflux disease)    Hypertension    Low TSH level 02/17/2017   Myocardial infarction [facility])    PAD (peripheral artery disease)    Paroxysmal atrial fibrillation (HCC)    Perforated duodenal ulcer (HCC)    Stroke (HCC)    left side is weak   Past Surgical History:  Procedure Laterality Date   BRONCHIAL BIOPSY  03/22/2022   Procedure: BRONCHIAL BIOPSIES;  Surgeon: Isadora Hose, MD;  Location: [facility] ENDOSCOPY;  Service: Pulmonary;;   BRONCHIAL NEEDLE ASPIRATION BIOPSY  03/22/2022   Procedure: BRONCHIAL NEEDLE ASPIRATION BIOPSIES;  Surgeon: Isadora Hose, MD;  Location: [facility] ENDOSCOPY;  Service: Pulmonary;;   ENDOBRONCHIAL ULTRASOUND  03/22/2022   Procedure: ENDOBRONCHIAL ULTRASOUND;  Surgeon: Isadora Hose, MD;  Location: [facility] ENDOSCOPY;  Service: Pulmonary;;   EXPLORATORY LAPAROTOMY  05/12/2015   EYE SURGERY     FRACTURE SURGERY     LAPAROTOMY N/A 05/10/2015   Procedure: EXPLORATORY LAPAROTOMY;  Surgeon: Lynda Leos, MD;  Location: [facility] OR;   Service: General;  Laterality: N/A;   LEFT HEART CATH AND CORONARY ANGIOGRAPHY N/A 05/02/2016   Procedure: Left Heart Cath and Coronary Angiography;  Surgeon: Gordy Bergamo, MD;  Location: [facility] INVASIVE CV LAB;  Service: Cardiovascular;  Laterality: N/A;   LOWER EXTREMITY ANGIOGRAPHY Left 09/21/2021   Procedure: Lower Extremity Angiography;  Surgeon: Jama Cordella KANDICE, MD;  Location: [facility] INVASIVE CV LAB;  Service: Cardiovascular;  Laterality: Left;   Patient Active Problem List   Diagnosis Date Noted   Aphasia 12/31/2023   Arthritis of left shoulder 12/31/2023   Allergic rhinitis due to pollen 12/11/2022   Tinea cruris 12/11/2022   BPH associated with nocturia 12/11/2022   Chest pain, non-cardiac 04/11/2022   Adenocarcinoma of lower lobe of right lung (HCC) 03/28/2022   Pulmonary nodule 1 cm or greater in diameter 03/22/2022   Leg weakness, bilateral 02/09/2022   Osteopenia 10/13/2021   PAD (peripheral artery disease) 09/21/2021   GERD without esophagitis 09/21/2021   Dyslipidemia 09/21/2021   Depression 09/21/2021   Atrial flutter (HCC) 09/21/2021   Chronic obstructive pulmonary disease (COPD) (HCC) 09/21/2021   Coronary artery disease 09/21/2021   Cerebrovascular accident (CVA) due to embolism of cerebral artery (HCC) 09/06/2021   Bilateral edema of lower extremity 08/05/2020   Vasovagal syncope 08/02/2020   Annual physical exam 12/26/2019  Hyperlipidemia 12/26/2019   Abrasion of right hand 12/26/2019   Metabolic syndrome 11/11/2019   Low TSH level 02/17/2017   Atrial flutter with rapid ventricular response (HCC) 02/16/2017   Cigarette smoker 02/16/2017   COPD with acute exacerbation (HCC) 02/16/2017   Elevated lactic acid level 02/16/2017   Aortic atherosclerosis 02/16/2017   Productive cough    Hypoxia 05/24/2015   SOB (shortness of breath) 05/24/2015   COPD suggested by initial evaluation 05/17/2015   Essential hypertension 05/17/2015   Smoking 05/17/2015    ONSET DATE:  Unsure, a long time   REFERRING DIAG:  M19.012 (ICD-10-CM) - Arthritis of left shoulder  I63.40 (ICD-10-CM) - Cerebrovascular accident (CVA) due to embolism of cerebral artery (HCC)     THERAPY DIAG:  Aphasia  Unsteadiness on feet  Muscle weakness (generalized)  Left shoulder pain, unspecified chronicity  Other lack of coordination  Cognitive communication deficit  Apraxia  Rationale for Evaluation and Treatment: Rehabilitation  SUBJECTIVE:                                                                                                                                                                                             SUBJECTIVE STATEMENT:  Patient reports no significant changes since previous treatment. No recent falls re ported. When asked about the shoulder he reports he is alright he reckons.    At Eval: Patient's sister in-law was present to assist with subjective portion of evaluation. Patient had CVA in June effecting R side. He did not have PT treatment following CVA initially due to focusing on speech first. Patient was noted to have damage to the Wernicke's Area causing aphasia making communication slightly difficult during today's evaluation. Patient walks without AD at home, but has decreased balance when walking. His sister in-law reports the R leg gave out last week and he fell in bedroom. He has also fallen in past when performing activities on uneven surfaces such as taking out garbage. He does do a little yard work such as picking up limbs and helping out in yard, but not much.   Patient reports that he has had L shoulder pain for a long time. Cannot say if he has had any previous injuries and does not think he has had imaging of shoulder. He expressed having sharp pain with movement of L shoulder from the shoulder to the elbow.    Pt accompanied by: Sister in-law.   PERTINENT HISTORY: CVA effecting L side in June 2025, Wernicke's aphasia, R shoulder  pain with movement.   PAIN:  Are you having pain? 9/10, but has difficulty describing.   PRECAUTIONS: Fall  and Other: Decreased safety awareness.  RED FLAGS: None   WEIGHT BEARING RESTRICTIONS: No  FALLS: Has patient fallen in last 6 months? Yes. Number of falls Patient is unsure.   LIVING ENVIRONMENT: Lives with: lives with their family Lives in: House/apartment Stairs: Yes: External: 4 steps; on right going up and on left going up Has following equipment at home: None  PLOF: Independent  PATIENT GOALS: Improve L shoulder function/pain levels; Improve balance with gait/ADLs.   OBJECTIVE:  Note: Objective measures were completed at Evaluation unless otherwise noted.  DIAGNOSTIC FINDINGS:   COMPARISON:  CT head 09/06/2021.   FINDINGS: Brain: Encephalomalacia in the anterior right temporal and frontal lobes. Smaller area of encephalomalacia in the inferior left frontal lobe. Remote lacunar infarct in the posterior limb of the left internal capsule. No evidence of acute large vascular territory, acute hemorrhage, mass lesion or midline shift.  COGNITION: Overall cognitive status: Wernicke's Aphasia    SENSATION: Not tested  COORDINATION: Decreased speed with toe taps.     POSTURE: rounded shoulders, forward head, and increased thoracic kyphosis   LOWER EXTREMITY MMT:    MMT Right Eval Left Eval  Hip flexion 4 3+  Hip extension    Hip abduction 5 5  Hip adduction    Hip internal rotation    Hip external rotation    Knee flexion 5 4+  Knee extension 5 4  Ankle dorsiflexion 5 5  Ankle plantarflexion    Ankle inversion    Ankle eversion    (Blank rows = not tested)  UPPER EXTREMITY MMT:  MMT Right eval Left eval  Shoulder flexion 5 4-  Shoulder extension    Shoulder abduction 5 4  Shoulder adduction    Shoulder extension    Shoulder internal rotation 5 5  Shoulder external rotation 5 4  Middle trapezius    Lower trapezius    Elbow flexion     Elbow extension    Wrist flexion    Wrist extension    Wrist ulnar deviation    Wrist radial deviation    Wrist pronation    Wrist supination    Grip strength     (Blank rows = not tested)   UPPER EXTREMITY ROM:  Active ROM Right eval Left eval  Shoulder flexion 155 108  Shoulder extension    Shoulder abduction 160 90  Shoulder adduction    Shoulder extension    Shoulder internal rotation [facility]  Shoulder external rotation 57 40  Elbow flexion    Elbow extension    Wrist flexion    Wrist extension    Wrist ulnar deviation    Wrist radial deviation    Wrist pronation    Wrist supination     (Blank rows = not tested)  BED MOBILITY:  Not tested  TRANSFERS: Sit to stand: Complete Independence and CGA  Assistive device utilized: Use of one upper extremity to assist trunk lift.      RAMP:  Not tested  CURB:  Not tested  STAIRS: Not tested GAIT: Findings: Gait Characteristics: decreased arm swing- Right, decreased arm swing- Left, decreased step length- Right, decreased step length- Left, and decreased stride length, Distance walked: 50', and Comments: .  FUNCTIONAL TESTS:  5 times sit to stand: 27.7'' Timed up and go (TUG): 65'' 10 meter walk test: 45''   PATIENT SURVEYS:  Unable to perform survey due to patient's cognition.  TREATMENT DATE: 03/18/24    -Sit to stands while holding 5 kg ball 10x with one hand assist.   -Seated L shoulder flexion:2 2x10  -Seated L shoulder abduction: 2x10  -Sit to stands while holding 3 kg ball 8x (Terminated due to L knee pain).   -Seated rows: GTB 3x10  -Attempted Agility Ladder (Did not perform due to L leg shaking/reported increased pain in L knee).   -Seated Reverse Fly: GTB 3x10  -Ball on wall: Circles x20 each.   -Isometrics with ball: ER, AB, Fl x10 each with 5'' holds.     PATIENT EDUCATION: Education details: Patient educated on exercises to perform at home to improve L shoulder mobility and strength with proper mechanics/positioning. Person educated: Patient and sister in-law Education method: Explanation, Demonstration, Verbal cues, and Handouts Education comprehension: returned demonstration  HOME EXERCISE PROGRAM:  Access Code: [email or web address] URL: [email or web address] Date: 01/22/2024 Prepared by: Norman Sharps  Exercises - Seated Shoulder External Rotation AAROM with Cane and Hand in Neutral  - 1 x daily - 7 x weekly - 1 sets - 15 reps - Seated Scapular Retraction  - 1 x daily - 7 x weekly - 1 sets - 15 reps - Supine Shoulder Flexion Extension AAROM with Dowel  - 1 x daily - 7 x weekly - 1 sets - 15 reps - Supine Shoulder Press with Dowel  - 1 x daily - 7 x weekly - 1 sets - 15 reps - Seated Shoulder Flexion AAROM with Dowel  - 1 x daily - 7 x weekly - 1 sets - 15 reps  GOALS: Goals reviewed with patient? Yes  SHORT TERM GOALS: Target date: 02/19/2024  Patient will be independent with HEP allowing him to perform exercises at home with proper positioning/mechanics/safety.  Baseline: HEP initiated at evaluation with handout.  Goal status: INITIAL    LONG TERM GOALS: Target date: 04/15/2024  1.  Patient (> 83 years old) will complete five times sit to stand test in < 15 seconds indicating an increased LE strength and improved balance. Baseline: 27.7'' 03/06/2024: 17.58''  Goal status: INITIAL  2.  Patient will improve gait mechanics showing good arm swing, foot clearance and increased stride length allowing him to ambulate more efficiently. Baseline: Decreased UE movement, lack of bilateral foot clearance during swing phase, decreased stride length, decreased gait speed. Goal status: INITIAL   3.  Patient will reduce timed up and go to <11 seconds to reduce fall risk and demonstrate improved transfer/gait ability. Baseline:  65'' without AD. 03/06/2024: 25.66'' without AD. Goal status: Progressing  4.  Patient will increase 10 meter walk test to >1.38m/s as to improve gait speed for better community ambulation and to reduce fall risk. Baseline: 45'' Goal status: 15.45'' = 0.64 m/s without AD.   5.  Patient will improve L shouder flexion/abduction AROM to match that of the R by discharge allowing him to reach objects at greater heights such as in kitchen cabinets.  Baseline: Flexion - 108 degrees; Abduction - 90 degrees.  Goal status: INITIAL  6.  Patient will improve L shoulder strength to 4+/5 MMT by discharge allowing him to hold and carry items in L upper extremity with decreased difficulty.  Baseline: See MMT table.  Goal status: INITIAL   ASSESSMENT:  CLINICAL IMPRESSION:  Today's treatment focused on improving L shoulder strength/stability while also improving functional movement. Patient noted to still have difficulty with sit to stand transfers as he had to use one arm today to assist  when holding med. Ball. He also continues to have improved gait, BUT still has shorter shuffling-like gait pattern when turning around objects. He did work on that today with figure 8 through cones. Patient did have increased L knee pain today hindering his ability to perform standing activities. Isometrics were introduced for L shoulder stability which he did show improvement with following multiple cues.   OBJECTIVE IMPAIRMENTS: Abnormal gait, decreased activity tolerance, decreased balance, decreased coordination, difficulty walking, decreased ROM, decreased strength, impaired UE functional use, and pain.   ACTIVITY LIMITATIONS: carrying, lifting, standing, squatting, stairs, and transfers  PARTICIPATION LIMITATIONS: meal prep, cleaning, laundry, interpersonal relationship, and yard work  PERSONAL FACTORS: Age, Past/current experiences, and 3+ comorbidities: Stroke, A -fib, PAD, HTN, hx. Of MI, COPD, blind in L eye.  are also affecting patient's functional outcome.   REHAB POTENTIAL: Fair Due to patient's difficulty with communication.  CLINICAL DECISION MAKING: Evolving/moderate complexity  EVALUATION COMPLEXITY: Moderate  PLAN:  PT FREQUENCY: 1-2x/week  PT DURATION: 12 weeks  PLANNED INTERVENTIONS: [id number]- PT Re-evaluation, 97750- Physical Performance Testing, 97110-Therapeutic exercises, 97530- Therapeutic activity, [id number]- Neuromuscular re-education, 97535- Self Care, [id number]- Manual therapy, [phone]- Gait training, [phone]- Electrical stimulation (unattended), [phone]- Electrical stimulation (manual), Patient/Family education, Balance training, Stair training, Taping, Joint mobilization, Cryotherapy, and Moist heat  PLAN FOR NEXT SESSION:  -Continue working on gait with turns.  -Continue working to improve functional movement, gait, transfers, and L shoulder strength/mobility.  -Continue working on gait speed and step length.     Norman Sharps, PT, DPT Physical Therapist - [facility]  Norman KATHEE Sharps, [place] 03/18/2024, 1:25 PM        "

## 2024-03-20 ENCOUNTER — Ambulatory Visit

## 2024-03-20 ENCOUNTER — Ambulatory Visit: Admitting: Speech Pathology

## 2024-03-20 DIAGNOSIS — R2681 Unsteadiness on feet: Secondary | ICD-10-CM

## 2024-03-20 DIAGNOSIS — R4701 Aphasia: Secondary | ICD-10-CM

## 2024-03-20 DIAGNOSIS — R278 Other lack of coordination: Secondary | ICD-10-CM

## 2024-03-20 DIAGNOSIS — M6281 Muscle weakness (generalized): Secondary | ICD-10-CM

## 2024-03-20 DIAGNOSIS — R482 Apraxia: Secondary | ICD-10-CM

## 2024-03-20 DIAGNOSIS — M25512 Pain in left shoulder: Secondary | ICD-10-CM

## 2024-03-20 DIAGNOSIS — R41841 Cognitive communication deficit: Secondary | ICD-10-CM

## 2024-03-20 NOTE — Therapy (Signed)
 " OUTPATIENT PHYSICAL THERAPY NEURO TREATMENT  Physical Therapy Progress Note   Dates of reporting period  01/22/2024   to   03/06/2024  Patient Name: Gregory Lane MRN: [id number] DOB:16-May-1950, 74 y.o., male 105 Date: 03/20/2024   PCP: Fernand Denyse LABOR, MD  REFERRING PROVIDER: Albina GORMAN Dine, MD   END OF SESSION:  PT End of Session - 03/20/24 1143     Visit Number 13    Number of Visits 25    Date for Recertification  04/15/24    PT Start Time 1145    PT Stop Time 1225    PT Time Calculation (min) 40 min    Equipment Utilized During Treatment Gait belt    Activity Tolerance Patient tolerated treatment well    Behavior During Therapy [facility] for tasks assessed/performed            Past Medical History:  Diagnosis Date   Anxiety    Aortic atherosclerosis 02/16/2017   Arthritis    Blind left eye    COPD (chronic obstructive pulmonary disease) (HCC)    Depression    Dysrhythmia    A-fib   GERD (gastroesophageal reflux disease)    Hypertension    Low TSH level 02/17/2017   Myocardial infarction [facility])    PAD (peripheral artery disease)    Paroxysmal atrial fibrillation (HCC)    Perforated duodenal ulcer (HCC)    Stroke (HCC)    left side is weak   Past Surgical History:  Procedure Laterality Date   BRONCHIAL BIOPSY  03/22/2022   Procedure: BRONCHIAL BIOPSIES;  Surgeon: Isadora Hose, MD;  Location: [facility] ENDOSCOPY;  Service: Pulmonary;;   BRONCHIAL NEEDLE ASPIRATION BIOPSY  03/22/2022   Procedure: BRONCHIAL NEEDLE ASPIRATION BIOPSIES;  Surgeon: Isadora Hose, MD;  Location: [facility] ENDOSCOPY;  Service: Pulmonary;;   ENDOBRONCHIAL ULTRASOUND  03/22/2022   Procedure: ENDOBRONCHIAL ULTRASOUND;  Surgeon: Isadora Hose, MD;  Location: [facility] ENDOSCOPY;  Service: Pulmonary;;   EXPLORATORY LAPAROTOMY  05/12/2015   EYE SURGERY     FRACTURE SURGERY     LAPAROTOMY N/A 05/10/2015   Procedure: EXPLORATORY LAPAROTOMY;  Surgeon: Lynda Leos, MD;  Location: [facility] OR;   Service: General;  Laterality: N/A;   LEFT HEART CATH AND CORONARY ANGIOGRAPHY N/A 05/02/2016   Procedure: Left Heart Cath and Coronary Angiography;  Surgeon: Gordy Bergamo, MD;  Location: [facility] INVASIVE CV LAB;  Service: Cardiovascular;  Laterality: N/A;   LOWER EXTREMITY ANGIOGRAPHY Left 09/21/2021   Procedure: Lower Extremity Angiography;  Surgeon: Jama Cordella KANDICE, MD;  Location: [facility] INVASIVE CV LAB;  Service: Cardiovascular;  Laterality: Left;   Patient Active Problem List   Diagnosis Date Noted   Aphasia 12/31/2023   Arthritis of left shoulder 12/31/2023   Allergic rhinitis due to pollen 12/11/2022   Tinea cruris 12/11/2022   BPH associated with nocturia 12/11/2022   Chest pain, non-cardiac 04/11/2022   Adenocarcinoma of lower lobe of right lung (HCC) 03/28/2022   Pulmonary nodule 1 cm or greater in diameter 03/22/2022   Leg weakness, bilateral 02/09/2022   Osteopenia 10/13/2021   PAD (peripheral artery disease) 09/21/2021   GERD without esophagitis 09/21/2021   Dyslipidemia 09/21/2021   Depression 09/21/2021   Atrial flutter (HCC) 09/21/2021   Chronic obstructive pulmonary disease (COPD) (HCC) 09/21/2021   Coronary artery disease 09/21/2021   Cerebrovascular accident (CVA) due to embolism of cerebral artery (HCC) 09/06/2021   Bilateral edema of lower extremity 08/05/2020   Vasovagal syncope 08/02/2020   Annual physical exam 12/26/2019  Hyperlipidemia 12/26/2019   Abrasion of right hand 12/26/2019   Metabolic syndrome 11/11/2019   Low TSH level 02/17/2017   Atrial flutter with rapid ventricular response (HCC) 02/16/2017   Cigarette smoker 02/16/2017   COPD with acute exacerbation (HCC) 02/16/2017   Elevated lactic acid level 02/16/2017   Aortic atherosclerosis 02/16/2017   Productive cough    Hypoxia 05/24/2015   SOB (shortness of breath) 05/24/2015   COPD suggested by initial evaluation 05/17/2015   Essential hypertension 05/17/2015   Smoking 05/17/2015    ONSET DATE:  Unsure, a long time   REFERRING DIAG:  M19.012 (ICD-10-CM) - Arthritis of left shoulder  I63.40 (ICD-10-CM) - Cerebrovascular accident (CVA) due to embolism of cerebral artery (HCC)     THERAPY DIAG:  Aphasia  Unsteadiness on feet  Muscle weakness (generalized)  Left shoulder pain, unspecified chronicity  Other lack of coordination  Apraxia  Cognitive communication deficit  Rationale for Evaluation and Treatment: Rehabilitation  SUBJECTIVE:                                                                                                                                                                                             SUBJECTIVE STATEMENT:  Patient reports doing well. Still doing his exercises at home. No recent falls.    At Eval: Patient's sister in-law was present to assist with subjective portion of evaluation. Patient had CVA in June effecting R side. He did not have PT treatment following CVA initially due to focusing on speech first. Patient was noted to have damage to the Wernicke's Area causing aphasia making communication slightly difficult during today's evaluation. Patient walks without AD at home, but has decreased balance when walking. His sister in-law reports the R leg gave out last week and he fell in bedroom. He has also fallen in past when performing activities on uneven surfaces such as taking out garbage. He does do a little yard work such as picking up limbs and helping out in yard, but not much.   Patient reports that he has had L shoulder pain for a long time. Cannot say if he has had any previous injuries and does not think he has had imaging of shoulder. He expressed having sharp pain with movement of L shoulder from the shoulder to the elbow.    Pt accompanied by: Sister in-law.   PERTINENT HISTORY: CVA effecting L side in June 2025, Wernicke's aphasia, R shoulder pain with movement.   PAIN:  Are you having pain? 9/10, but has difficulty  describing.   PRECAUTIONS: Fall and Other: Decreased safety awareness.  RED FLAGS: None   WEIGHT  BEARING RESTRICTIONS: No  FALLS: Has patient fallen in last 6 months? Yes. Number of falls Patient is unsure.   LIVING ENVIRONMENT: Lives with: lives with their family Lives in: House/apartment Stairs: Yes: External: 4 steps; on right going up and on left going up Has following equipment at home: None  PLOF: Independent  PATIENT GOALS: Improve L shoulder function/pain levels; Improve balance with gait/ADLs.   OBJECTIVE:  Note: Objective measures were completed at Evaluation unless otherwise noted.  DIAGNOSTIC FINDINGS:   COMPARISON:  CT head 09/06/2021.   FINDINGS: Brain: Encephalomalacia in the anterior right temporal and frontal lobes. Smaller area of encephalomalacia in the inferior left frontal lobe. Remote lacunar infarct in the posterior limb of the left internal capsule. No evidence of acute large vascular territory, acute hemorrhage, mass lesion or midline shift.  COGNITION: Overall cognitive status: Wernicke's Aphasia    SENSATION: Not tested  COORDINATION: Decreased speed with toe taps.     POSTURE: rounded shoulders, forward head, and increased thoracic kyphosis   LOWER EXTREMITY MMT:    MMT Right Eval Left Eval  Hip flexion 4 3+  Hip extension    Hip abduction 5 5  Hip adduction    Hip internal rotation    Hip external rotation    Knee flexion 5 4+  Knee extension 5 4  Ankle dorsiflexion 5 5  Ankle plantarflexion    Ankle inversion    Ankle eversion    (Blank rows = not tested)  UPPER EXTREMITY MMT:  MMT Right eval Left eval  Shoulder flexion 5 4-  Shoulder extension    Shoulder abduction 5 4  Shoulder adduction    Shoulder extension    Shoulder internal rotation 5 5  Shoulder external rotation 5 4  Middle trapezius    Lower trapezius    Elbow flexion    Elbow extension    Wrist flexion    Wrist extension    Wrist ulnar  deviation    Wrist radial deviation    Wrist pronation    Wrist supination    Grip strength     (Blank rows = not tested)   UPPER EXTREMITY ROM:  Active ROM Right eval Left eval  Shoulder flexion 155 108  Shoulder extension    Shoulder abduction 160 90  Shoulder adduction    Shoulder extension    Shoulder internal rotation [facility]  Shoulder external rotation 57 40  Elbow flexion    Elbow extension    Wrist flexion    Wrist extension    Wrist ulnar deviation    Wrist radial deviation    Wrist pronation    Wrist supination     (Blank rows = not tested)  BED MOBILITY:  Not tested  TRANSFERS: Sit to stand: Complete Independence and CGA  Assistive device utilized: Use of one upper extremity to assist trunk lift.      RAMP:  Not tested  CURB:  Not tested  STAIRS: Not tested GAIT: Findings: Gait Characteristics: decreased arm swing- Right, decreased arm swing- Left, decreased step length- Right, decreased step length- Left, and decreased stride length, Distance walked: 50', and Comments: .  FUNCTIONAL TESTS:  5 times sit to stand: 27.7'' Timed up and go (TUG): 65'' 10 meter walk test: 45''   PATIENT SURVEYS:  Unable to perform survey due to patient's cognition.  TREATMENT DATE: 03/20/24    -Sit to stands while holding 5 kg ball 10x with one hand assist.   -Seated L shoulder flexion: 2x10  -Seated L shoulder abduction: 2x10  -Seated rows: GTB 3x10  -Seated Reverse Fly: GTB 3x10  -GT: 2 laps (300' with CGA for safety).   -GT around cones: 4 cones  - laps with verbal cues to look to see cones and to take larger steps during turns. (1 fault with exercise).   -Agility Ladder x 2 laps focusing on larger step length.   -Agility ladder: side-stepping x15 each direction.   -Ball on wall: Circles x20 each.   -Isometrics with ball: ER,  AB, Fl x10 each with 5'' holds.    PATIENT EDUCATION: Education details: Patient educated on exercises to perform at home to improve L shoulder mobility and strength with proper mechanics/positioning. Person educated: Patient and sister in-law Education method: Explanation, Demonstration, Verbal cues, and Handouts Education comprehension: returned demonstration  HOME EXERCISE PROGRAM:  Access Code: [email or web address] URL: [email or web address] Date: 01/22/2024 Prepared by: Norman Sharps  Exercises - Seated Shoulder External Rotation AAROM with Cane and Hand in Neutral  - 1 x daily - 7 x weekly - 1 sets - 15 reps - Seated Scapular Retraction  - 1 x daily - 7 x weekly - 1 sets - 15 reps - Supine Shoulder Flexion Extension AAROM with Dowel  - 1 x daily - 7 x weekly - 1 sets - 15 reps - Supine Shoulder Press with Dowel  - 1 x daily - 7 x weekly - 1 sets - 15 reps - Seated Shoulder Flexion AAROM with Dowel  - 1 x daily - 7 x weekly - 1 sets - 15 reps  GOALS: Goals reviewed with patient? Yes  SHORT TERM GOALS: Target date: 02/19/2024  Patient will be independent with HEP allowing him to perform exercises at home with proper positioning/mechanics/safety.  Baseline: HEP initiated at evaluation with handout.  Goal status: INITIAL    LONG TERM GOALS: Target date: 04/15/2024  1.  Patient (> 33 years old) will complete five times sit to stand test in < 15 seconds indicating an increased LE strength and improved balance. Baseline: 27.7'' 03/06/2024: 17.58''  Goal status: INITIAL  2.  Patient will improve gait mechanics showing good arm swing, foot clearance and increased stride length allowing him to ambulate more efficiently. Baseline: Decreased UE movement, lack of bilateral foot clearance during swing phase, decreased stride length, decreased gait speed. Goal status: INITIAL   3.  Patient will reduce timed up and go to <11 seconds to reduce fall risk and demonstrate improved  transfer/gait ability. Baseline: 65'' without AD. 03/06/2024: 25.66'' without AD. Goal status: Progressing  4.  Patient will increase 10 meter walk test to >1.49m/s as to improve gait speed for better community ambulation and to reduce fall risk. Baseline: 45'' Goal status: 15.45'' = 0.64 m/s without AD.   5.  Patient will improve L shouder flexion/abduction AROM to match that of the R by discharge allowing him to reach objects at greater heights such as in kitchen cabinets.  Baseline: Flexion - 108 degrees; Abduction - 90 degrees.  Goal status: INITIAL  6.  Patient will improve L shoulder strength to 4+/5 MMT by discharge allowing him to hold and carry items in L upper extremity with decreased difficulty.  Baseline: See MMT table.  Goal status: INITIAL   ASSESSMENT:  CLINICAL IMPRESSION:  Today's treatment continued focus to improve L shoulder mobility/strength/stability while  improving functional movement. He did show improvement with L shoulder ROM today and had less difficulty with AROM compared to previous visit. He also showed improvement with gait when walking around obstacles today only having one fault when walking through cones. Isometrics and ball on wall exercises were continued to improve L shoulder stability. Overall, he continues to perform well with PT showing improvement in L shoulder mobility/overall mobility. Patient may benefit from continuing skilled PT at this time.   OBJECTIVE IMPAIRMENTS: Abnormal gait, decreased activity tolerance, decreased balance, decreased coordination, difficulty walking, decreased ROM, decreased strength, impaired UE functional use, and pain.   ACTIVITY LIMITATIONS: carrying, lifting, standing, squatting, stairs, and transfers  PARTICIPATION LIMITATIONS: meal prep, cleaning, laundry, interpersonal relationship, and yard work  PERSONAL FACTORS: Age, Past/current experiences, and 3+ comorbidities: Stroke, A -fib, PAD, HTN, hx. Of MI, COPD,  blind in L eye. are also affecting patient's functional outcome.   REHAB POTENTIAL: Fair Due to patient's difficulty with communication.  CLINICAL DECISION MAKING: Evolving/moderate complexity  EVALUATION COMPLEXITY: Moderate  PLAN:  PT FREQUENCY: 1-2x/week  PT DURATION: 12 weeks  PLANNED INTERVENTIONS: 97164- PT Re-evaluation, 97750- Physical Performance Testing, 97110-Therapeutic exercises, 97530- Therapeutic activity, [id number]- Neuromuscular re-education, 97535- Self Care, [id number]- Manual therapy, [phone]- Gait training, [phone]- Electrical stimulation (unattended), [phone]- Electrical stimulation (manual), Patient/Family education, Balance training, Stair training, Taping, Joint mobilization, Cryotherapy, and Moist heat  PLAN FOR NEXT SESSION:  -Continue working on gait with turns.  -Continue working to improve functional movement, gait, transfers, and L shoulder strength/mobility.  -Continue working on gait speed and step length.     Norman Sharps, PT, DPT Physical Therapist - [facility]  Norman KATHEE Sharps, [place] 03/20/2024, 1:04 PM        "

## 2024-03-20 NOTE — Therapy (Signed)
 " OUTPATIENT SPEECH LANGUAGE PATHOLOGY  SPEECH LANGUAGE TREATMENT NOTE    Patient Name: Gregory Lane MRN: [id number] DOB:[birth year], 74 y.o., male 78 Date: 03/20/2024   PCP: Denyse DELENA Bathe, MD REFERRING PROVIDER: Arland Earnie Pouch, MD     End of Session - 03/20/24 1403     Visit Number 48    Number of Visits 64    Date for Recertification  05/06/24    Authorization Type [employer] [place]    Progress Note Due on Visit 50    SLP Start Time 1230    SLP Stop Time  1305    SLP Time Calculation (min) 35 min    Activity Tolerance Patient tolerated treatment well          Past Medical History:  Diagnosis Date   Anxiety    Aortic atherosclerosis 02/16/2017   Arthritis    Blind left eye    COPD (chronic obstructive pulmonary disease) (HCC)    Depression    Dysrhythmia    A-fib   GERD (gastroesophageal reflux disease)    Hypertension    Low TSH level 02/17/2017   Myocardial infarction [facility])    PAD (peripheral artery disease)    Paroxysmal atrial fibrillation (HCC)    Perforated duodenal ulcer (HCC)    Stroke (HCC)    left side is weak   Past Surgical History:  Procedure Laterality Date   BRONCHIAL BIOPSY  03/22/2022   Procedure: BRONCHIAL BIOPSIES;  Surgeon: Isadora Hose, MD;  Location: [facility];  Service: Pulmonary;;   BRONCHIAL NEEDLE ASPIRATION BIOPSY  03/22/2022   Procedure: BRONCHIAL NEEDLE ASPIRATION BIOPSIES;  Surgeon: Isadora Hose, MD;  Location: [facility];  Service: Pulmonary;;   ENDOBRONCHIAL ULTRASOUND  03/22/2022   Procedure: ENDOBRONCHIAL ULTRASOUND;  Surgeon: Isadora Hose, MD;  Location: [facility];  Service: Pulmonary;;   EXPLORATORY LAPAROTOMY  05/12/2015   EYE SURGERY     FRACTURE SURGERY     LAPAROTOMY N/A 05/10/2015   Procedure: EXPLORATORY LAPAROTOMY;  Surgeon: Lynda Leos, MD;  Location: [facility] OR;  Service: General;  Laterality: N/A;   LEFT HEART CATH AND CORONARY ANGIOGRAPHY N/A 05/02/2016   Procedure: Left Heart Cath  and Coronary Angiography;  Surgeon: Gordy Bergamo, MD;  Location: [facility] INVASIVE CV LAB;  Service: Cardiovascular;  Laterality: N/A;   LOWER EXTREMITY ANGIOGRAPHY Left 09/21/2021   Procedure: Lower Extremity Angiography;  Surgeon: Jama Cordella KANDICE, MD;  Location: [facility] INVASIVE CV LAB;  Service: Cardiovascular;  Laterality: Left;   Patient Active Problem List   Diagnosis Date Noted   Aphasia 12/31/2023   Arthritis of left shoulder 12/31/2023   Allergic rhinitis due to pollen 12/11/2022   Tinea cruris 12/11/2022   BPH associated with nocturia 12/11/2022   Chest pain, non-cardiac 04/11/2022   Adenocarcinoma of lower lobe of right lung (HCC) 03/28/2022   Pulmonary nodule 1 cm or greater in diameter 03/22/2022   Leg weakness, bilateral 02/09/2022   Osteopenia 10/13/2021   PAD (peripheral artery disease) 09/21/2021   GERD without esophagitis 09/21/2021   Dyslipidemia 09/21/2021   Depression 09/21/2021   Atrial flutter (HCC) 09/21/2021   Chronic obstructive pulmonary disease (COPD) (HCC) 09/21/2021   Coronary artery disease 09/21/2021   Cerebrovascular accident (CVA) due to embolism of cerebral artery (HCC) 09/06/2021   Bilateral edema of lower extremity 08/05/2020   Vasovagal syncope 08/02/2020   Annual physical exam 12/26/2019   Hyperlipidemia 12/26/2019   Abrasion of right hand 12/26/2019   Metabolic syndrome 11/11/2019   Low TSH level  02/17/2017   Atrial flutter with rapid ventricular response (HCC) 02/16/2017   Cigarette smoker 02/16/2017   COPD with acute exacerbation (HCC) 02/16/2017   Elevated lactic acid level 02/16/2017   Aortic atherosclerosis 02/16/2017   Productive cough    Hypoxia 05/24/2015   SOB (shortness of breath) 05/24/2015   COPD suggested by initial evaluation 05/17/2015   Essential hypertension 05/17/2015   Smoking 05/17/2015    ONSET DATE: 08/09/2023   REFERRING DIAG: I63.9 (ICD-10-CM) - Cerebral infarction, unspecified   THERAPY DIAG:  Aphasia  Rationale  for Evaluation and Treatment Rehabilitation  SUBJECTIVE:   PERTINENT HISTORY and DIAGNOSTIC FINDINGS: Pt is a 74 year old right handed male who [facility] ED on 08/09/2023 with stroke-like symptoms - slurred speech and difficulty answering questions and weakness to his right side extremities. CT head and subsequent CTA head and neck shows left M2 occlusion with large penumbra in the region. Pt transferred to [facility] for mechanical thrombectomy on 08/10/2023. Pt admitted at [facility] from 08/10/2023 thru 08/21/2023 with recommended for home health.   MRI brain: Remonstrated left MCA territory infarct primarily involving the parietal lobe.   Additional medical history includes: blindness left eye, A-fib on warfarin, COPD, HFpEF, HTN, current smoker, ethanol abuse, RLL adenocarcinoma s/p SBRT (2024)    PAIN:  Are you having pain? Unable to communicate  FALLS: Has patient fallen in last 6 months?  No  LIVING ENVIRONMENT: Lives with: sister-in-law Lives in: House/apartment  PLOF:  Level of assistance: Independent with ADLs, Independent with IADLs, Comment: poor health literacy, difficulty with reading and writing per family report Employment: Retired   PATIENT GOALS    per pt's sister-in-law, she hopes he will talk again  SUBJECTIVE STATEMENT: Pt pleasant  Pt accompanied by: sister-in-law  OBJECTIVE:   TODAY'S TREATMENT:  Skilled ST treatment session targeted pt's aphasia and apraxia of speech goals. SLP facilitated session by providing the following interventions:  Pt's sister-in-law continues to report slow progress with communication. However pt arrives to session and able to provide enough intelligilbe speech to communicate adding additional picture of food item - tomatoes and okra that comes in an orange can.   To increase comprehension skills, pictures of common foods items that pt likes were paired with their words and presented in the following order: SLP verbally presented  each picture with written word. Pt then said each word with the clinician intelligibility in 20 out of 40 opportunities. Pt then presented with object picture and independently produced 14 out of 40 words intelligibly. Pt appeared somewhat discouraged by reduced number of intelligible words as evidenced by his increased frowning. Education provided on overall progress towards goals as well as improved functional utterances such as see you next week that pt had just said.   PATIENT EDUCATION: Education details: see above Person educated: Patient and his sister-in-law Education method: Explanation Education comprehension: needs further education  HOME EXERCISE PROGRAM:   Supplement verbal information with single written words  Continue practicing with pictures and written cards  GOALS:  Goals reviewed with patient? Yes  SHORT TERM GOALS: Target date: 10 sessions Updated 02/12/2024  Updated 12/25/2023   4. With minimal assistance, pt will point to 1 common object by function in a field of 3 with 90% accuracy.  Baseline:  Goal status: INITIAL: progress made: With minimal assistance, pt will point to 1 common object by function in a field of 3 with 80% accuracy in 3 out of 5 sessions.   MET  5.  With minimal to moderate assistance, pt will point, in sequence to 2 common objects by function in field of 3 with 90% accuracy.   Baseline:  Goal status: INITIAL: progress made: With minimal to moderate assistance, pt will point, in sequence to 2 common objects by function in field of 3 with 80% accuracy in 3 out of 5 sessions.   MET  6. With minimal to moderate assistance pt will verablly approximate names of basic functional objects with > 75% speech intelligiblity at the word level.    Baseline:  Goal status: INITIAL: progress made, continue targeting: With minimal to moderate assistance pt with verablly approximate names of basic functional objects with > 75% speech intelligiblity at the word  level in 3 out of 5 sessions.  PROGRESS MADE  9. With minimal assistance, pt will point, in sequence, to 2 common objects by name from a field of 3 with 80% accuracy across 3 consecutive sessions.  Baseline:  Goal status: INITIAL: PROGRESS MADE  LONG TERM GOALS: Target date: 05/06/2024  Updated: 02/12/2024 Updated 12/25/2023  1. Pt will use multimodal communication and maximal assistance to communicate basic wants and needs in 2 out of 5 opportunities per pt and family report.  Baseline:  Goal status: INITIAL: progress, continue targeting  2.  With Maximal A, patient/family will demonstrate understanding of the following concepts: aphasia, spontaneous recovery, communication vs conversation, strengths/strategies to promote success, local resources by answering multiple choice questions with 50% accuracy when provided supported conversation in order to increase patient and caregiver's participation in medical care.  Baseline:  Goal status: INITIAL: progress, continue targeting   ASSESSMENT:  CLINICAL IMPRESSION: Patient is a 74 y.o. right handed male who was seen today for a speech language treatment d/t left MCA CVA.   Pt presents with improving language impairment, now conceptualized as moderate to severe Wernicke's Aphasia (as per WAB-R, above).   Verbal Communication Pt's verbal communication continues to be fluent but increased number of islands of intelligible speech especially when commenting.   Auditory Comprehension Pt with improved auditory comprehension as evidenced by his ability to select objects in field of 4, answer basic to progressing semi-complex yes/no questions and following 1 step directions.   Written Language Pt with improved letter recognition and ability to copy written words.   Reading Comprehension Continues to be a strength for pt. Utilized to supplement receptive language deficits.   See the above treatment note for details.   OBJECTIVE IMPAIRMENTS  include expressive language, receptive language, aphasia, apraxia, and written language. These impairments are limiting patient from managing medications, managing appointments, managing finances, household responsibilities, ADLs/IADLs, and effectively communicating at home and in community. Factors affecting potential to achieve goals and functional outcome are severity of impairments, family/community support, and significantly reduced health literacy. Patient will benefit from skilled SLP services to address above impairments and improve overall function.  REHAB POTENTIAL: Good  PLAN: SLP FREQUENCY: 1-2x/week  SLP DURATION: 12 weeks  PLANNED INTERVENTIONS: Language facilitation, Cueing hierachy, Internal/external aids, Functional tasks, Multimodal communication approach, SLP instruction and feedback, Compensatory strategies, and Patient/family education    Weslee Fogg B. Rubbie, M.S., CCC-SLP, CBIS Speech-Language Pathologist Certified Brain Injury Specialist [facility]  [facility] [phone] Ascom [phone] Fax [phone]   "

## 2024-03-25 ENCOUNTER — Ambulatory Visit: Admitting: Speech Pathology

## 2024-03-25 ENCOUNTER — Ambulatory Visit

## 2024-03-25 DIAGNOSIS — R4701 Aphasia: Secondary | ICD-10-CM

## 2024-03-25 DIAGNOSIS — M6281 Muscle weakness (generalized): Secondary | ICD-10-CM

## 2024-03-25 DIAGNOSIS — M25512 Pain in left shoulder: Secondary | ICD-10-CM

## 2024-03-25 DIAGNOSIS — R2681 Unsteadiness on feet: Secondary | ICD-10-CM

## 2024-03-25 NOTE — Therapy (Signed)
 " OUTPATIENT PHYSICAL THERAPY NEURO TREATMENT   Patient Name: Gregory Lane MRN: [id number] DOB:Oct 01, 1950, 74 y.o., male 61 Date: 03/25/2024   PCP: Fernand Denyse LABOR, MD  REFERRING PROVIDER: Albina GORMAN Dine, MD   END OF SESSION:  PT End of Session - 03/25/24 1104     Visit Number 14    Number of Visits 25    Date for Recertification  04/15/24    PT Start Time 1102    PT Stop Time 1140    PT Time Calculation (min) 38 min    Equipment Utilized During Treatment Gait belt    Activity Tolerance Patient tolerated treatment well    Behavior During Therapy [facility] for tasks assessed/performed             Past Medical History:  Diagnosis Date   Anxiety    Aortic atherosclerosis 02/16/2017   Arthritis    Blind left eye    COPD (chronic obstructive pulmonary disease) (HCC)    Depression    Dysrhythmia    A-fib   GERD (gastroesophageal reflux disease)    Hypertension    Low TSH level 02/17/2017   Myocardial infarction [facility])    PAD (peripheral artery disease)    Paroxysmal atrial fibrillation (HCC)    Perforated duodenal ulcer (HCC)    Stroke (HCC)    left side is weak   Past Surgical History:  Procedure Laterality Date   BRONCHIAL BIOPSY  03/22/2022   Procedure: BRONCHIAL BIOPSIES;  Surgeon: Isadora Hose, MD;  Location: [facility] ENDOSCOPY;  Service: Pulmonary;;   BRONCHIAL NEEDLE ASPIRATION BIOPSY  03/22/2022   Procedure: BRONCHIAL NEEDLE ASPIRATION BIOPSIES;  Surgeon: Isadora Hose, MD;  Location: [facility] ENDOSCOPY;  Service: Pulmonary;;   ENDOBRONCHIAL ULTRASOUND  03/22/2022   Procedure: ENDOBRONCHIAL ULTRASOUND;  Surgeon: Isadora Hose, MD;  Location: [facility] ENDOSCOPY;  Service: Pulmonary;;   EXPLORATORY LAPAROTOMY  05/12/2015   EYE SURGERY     FRACTURE SURGERY     LAPAROTOMY N/A 05/10/2015   Procedure: EXPLORATORY LAPAROTOMY;  Surgeon: Lynda Leos, MD;  Location: [facility] OR;  Service: General;  Laterality: N/A;   LEFT HEART CATH AND CORONARY ANGIOGRAPHY N/A 05/02/2016    Procedure: Left Heart Cath and Coronary Angiography;  Surgeon: Gordy Bergamo, MD;  Location: [facility] INVASIVE CV LAB;  Service: Cardiovascular;  Laterality: N/A;   LOWER EXTREMITY ANGIOGRAPHY Left 09/21/2021   Procedure: Lower Extremity Angiography;  Surgeon: Jama Cordella KANDICE, MD;  Location: [facility] INVASIVE CV LAB;  Service: Cardiovascular;  Laterality: Left;   Patient Active Problem List   Diagnosis Date Noted   Aphasia 12/31/2023   Arthritis of left shoulder 12/31/2023   Allergic rhinitis due to pollen 12/11/2022   Tinea cruris 12/11/2022   BPH associated with nocturia 12/11/2022   Chest pain, non-cardiac 04/11/2022   Adenocarcinoma of lower lobe of right lung (HCC) 03/28/2022   Pulmonary nodule 1 cm or greater in diameter 03/22/2022   Leg weakness, bilateral 02/09/2022   Osteopenia 10/13/2021   PAD (peripheral artery disease) 09/21/2021   GERD without esophagitis 09/21/2021   Dyslipidemia 09/21/2021   Depression 09/21/2021   Atrial flutter (HCC) 09/21/2021   Chronic obstructive pulmonary disease (COPD) (HCC) 09/21/2021   Coronary artery disease 09/21/2021   Cerebrovascular accident (CVA) due to embolism of cerebral artery (HCC) 09/06/2021   Bilateral edema of lower extremity 08/05/2020   Vasovagal syncope 08/02/2020   Annual physical exam 12/26/2019   Hyperlipidemia 12/26/2019   Abrasion of right hand 12/26/2019   Metabolic syndrome 11/11/2019   Low  TSH level 02/17/2017   Atrial flutter with rapid ventricular response (HCC) 02/16/2017   Cigarette smoker 02/16/2017   COPD with acute exacerbation (HCC) 02/16/2017   Elevated lactic acid level 02/16/2017   Aortic atherosclerosis 02/16/2017   Productive cough    Hypoxia 05/24/2015   SOB (shortness of breath) 05/24/2015   COPD suggested by initial evaluation 05/17/2015   Essential hypertension 05/17/2015   Smoking 05/17/2015    ONSET DATE: Unsure, a long time   REFERRING DIAG:  M19.012 (ICD-10-CM) - Arthritis of left shoulder   I63.40 (ICD-10-CM) - Cerebrovascular accident (CVA) due to embolism of cerebral artery (HCC)     THERAPY DIAG:  Unsteadiness on feet  Muscle weakness (generalized)  Left shoulder pain, unspecified chronicity  Rationale for Evaluation and Treatment: Rehabilitation  SUBJECTIVE:                                                                                                                                                                                             SUBJECTIVE STATEMENT:  Patient's sister reports no stumbles or falls since last session.    At Eval: Patient's sister in-law was present to assist with subjective portion of evaluation. Patient had CVA in June effecting R side. He did not have PT treatment following CVA initially due to focusing on speech first. Patient was noted to have damage to the Wernicke's Area causing aphasia making communication slightly difficult during today's evaluation. Patient walks without AD at home, but has decreased balance when walking. His sister in-law reports the R leg gave out last week and he fell in bedroom. He has also fallen in past when performing activities on uneven surfaces such as taking out garbage. He does do a little yard work such as picking up limbs and helping out in yard, but not much.   Patient reports that he has had L shoulder pain for a long time. Cannot say if he has had any previous injuries and does not think he has had imaging of shoulder. He expressed having sharp pain with movement of L shoulder from the shoulder to the elbow.    Pt accompanied by: Sister in-law.   PERTINENT HISTORY: CVA effecting L side in June 2025, Wernicke's aphasia, R shoulder pain with movement.   PAIN:  Are you having pain? 9/10, but has difficulty describing.   PRECAUTIONS: Fall and Other: Decreased safety awareness.  RED FLAGS: None   WEIGHT BEARING RESTRICTIONS: No  FALLS: Has patient fallen in last 6 months? Yes. Number of falls  Patient is unsure.   LIVING ENVIRONMENT: Lives with: lives with their family Lives in: House/apartment Stairs:  Yes: External: 4 steps; on right going up and on left going up Has following equipment at home: None  PLOF: Independent  PATIENT GOALS: Improve L shoulder function/pain levels; Improve balance with gait/ADLs.   OBJECTIVE:  Note: Objective measures were completed at Evaluation unless otherwise noted.  DIAGNOSTIC FINDINGS:   COMPARISON:  CT head 09/06/2021.   FINDINGS: Brain: Encephalomalacia in the anterior right temporal and frontal lobes. Smaller area of encephalomalacia in the inferior left frontal lobe. Remote lacunar infarct in the posterior limb of the left internal capsule. No evidence of acute large vascular territory, acute hemorrhage, mass lesion or midline shift.  COGNITION: Overall cognitive status: Wernicke's Aphasia    SENSATION: Not tested  COORDINATION: Decreased speed with toe taps.     POSTURE: rounded shoulders, forward head, and increased thoracic kyphosis   LOWER EXTREMITY MMT:    MMT Right Eval Left Eval  Hip flexion 4 3+  Hip extension    Hip abduction 5 5  Hip adduction    Hip internal rotation    Hip external rotation    Knee flexion 5 4+  Knee extension 5 4  Ankle dorsiflexion 5 5  Ankle plantarflexion    Ankle inversion    Ankle eversion    (Blank rows = not tested)  UPPER EXTREMITY MMT:  MMT Right eval Left eval  Shoulder flexion 5 4-  Shoulder extension    Shoulder abduction 5 4  Shoulder adduction    Shoulder extension    Shoulder internal rotation 5 5  Shoulder external rotation 5 4  Middle trapezius    Lower trapezius    Elbow flexion    Elbow extension    Wrist flexion    Wrist extension    Wrist ulnar deviation    Wrist radial deviation    Wrist pronation    Wrist supination    Grip strength     (Blank rows = not tested)   UPPER EXTREMITY ROM:  Active ROM Right eval Left eval  Shoulder  flexion 155 108  Shoulder extension    Shoulder abduction 160 90  Shoulder adduction    Shoulder extension    Shoulder internal rotation [facility]  Shoulder external rotation 57 40  Elbow flexion    Elbow extension    Wrist flexion    Wrist extension    Wrist ulnar deviation    Wrist radial deviation    Wrist pronation    Wrist supination     (Blank rows = not tested)  BED MOBILITY:  Not tested  TRANSFERS: Sit to stand: Complete Independence and CGA  Assistive device utilized: Use of one upper extremity to assist trunk lift.      RAMP:  Not tested  CURB:  Not tested  STAIRS: Not tested GAIT: Findings: Gait Characteristics: decreased arm swing- Right, decreased arm swing- Left, decreased step length- Right, decreased step length- Left, and decreased stride length, Distance walked: 50', and Comments: .  FUNCTIONAL TESTS:  5 times sit to stand: 27.7'' Timed up and go (TUG): 65'' 10 meter walk test: 45''   PATIENT SURVEYS:  Unable to perform survey due to patient's cognition.  TREATMENT DATE: 03/25/24   TE- To improve strength, endurance, mobility, and function of specific targeted muscle groups or improve joint range of motion or improve muscle flexibility   -Seated L shoulder flexion: 2x10  -Seated rows: GTB 2x10  -Seated Reverse Fly: GTB 2x10  -Ball on wall: Circles x20 each. -terminated due to pain  UE ranger:  -flexion 20x -circles 10x each direction     TA- To improve functional movements patterns for everyday tasks    Ambulate 160 ft with with cues for foot clearance , close CGA x 2 trials  Lateral step over orange hurdle 10x each side with UE support  Forward step over orange hurdle 10x each LE with UE support  Stick to L hamstring and calf x 3 minutes  STS 10x   PATIENT EDUCATION: Education details: Patient educated on  exercises to perform at home to improve L shoulder mobility and strength with proper mechanics/positioning. Person educated: Patient and sister in-law Education method: Explanation, Demonstration, Verbal cues, and Handouts Education comprehension: returned demonstration  HOME EXERCISE PROGRAM:  Access Code: [email or web address] URL: [email or web address] Date: 01/22/2024 Prepared by: Norman Sharps  Exercises - Seated Shoulder External Rotation AAROM with Cane and Hand in Neutral  - 1 x daily - 7 x weekly - 1 sets - 15 reps - Seated Scapular Retraction  - 1 x daily - 7 x weekly - 1 sets - 15 reps - Supine Shoulder Flexion Extension AAROM with Dowel  - 1 x daily - 7 x weekly - 1 sets - 15 reps - Supine Shoulder Press with Dowel  - 1 x daily - 7 x weekly - 1 sets - 15 reps - Seated Shoulder Flexion AAROM with Dowel  - 1 x daily - 7 x weekly - 1 sets - 15 reps  GOALS: Goals reviewed with patient? Yes  SHORT TERM GOALS: Target date: 02/19/2024  Patient will be independent with HEP allowing him to perform exercises at home with proper positioning/mechanics/safety.  Baseline: HEP initiated at evaluation with handout.  Goal status: INITIAL    LONG TERM GOALS: Target date: 04/15/2024  1.  Patient (> 45 years old) will complete five times sit to stand test in < 15 seconds indicating an increased LE strength and improved balance. Baseline: 27.7'' 03/06/2024: 17.58''  Goal status: INITIAL  2.  Patient will improve gait mechanics showing good arm swing, foot clearance and increased stride length allowing him to ambulate more efficiently. Baseline: Decreased UE movement, lack of bilateral foot clearance during swing phase, decreased stride length, decreased gait speed. Goal status: INITIAL   3.  Patient will reduce timed up and go to <11 seconds to reduce fall risk and demonstrate improved transfer/gait ability. Baseline: 65'' without AD. 03/06/2024: 25.66'' without AD. Goal status:  Progressing  4.  Patient will increase 10 meter walk test to >1.11m/s as to improve gait speed for better community ambulation and to reduce fall risk. Baseline: 45'' Goal status: 15.45'' = 0.64 m/s without AD.   5.  Patient will improve L shouder flexion/abduction AROM to match that of the R by discharge allowing him to reach objects at greater heights such as in kitchen cabinets.  Baseline: Flexion - 108 degrees; Abduction - 90 degrees.  Goal status: INITIAL  6.  Patient will improve L shoulder strength to 4+/5 MMT by discharge allowing him to hold and carry items in L upper extremity with decreased difficulty.  Baseline: See MMT table.  Goal status: INITIAL   ASSESSMENT:  CLINICAL  IMPRESSION:  Patient has intermittent LUE pain with movements this session but is able to perform with functional movement patterns. He has limited foot clearance with ambulation that improves with visual and verbal cueing.  Overall, he continues to perform well with PT showing improvement in L shoulder mobility/overall mobility. Patient may benefit from continuing skilled PT at this time.   OBJECTIVE IMPAIRMENTS: Abnormal gait, decreased activity tolerance, decreased balance, decreased coordination, difficulty walking, decreased ROM, decreased strength, impaired UE functional use, and pain.   ACTIVITY LIMITATIONS: carrying, lifting, standing, squatting, stairs, and transfers  PARTICIPATION LIMITATIONS: meal prep, cleaning, laundry, interpersonal relationship, and yard work  PERSONAL FACTORS: Age, Past/current experiences, and 3+ comorbidities: Stroke, A -fib, PAD, HTN, hx. Of MI, COPD, blind in L eye. are also affecting patient's functional outcome.   REHAB POTENTIAL: Fair Due to patient's difficulty with communication.  CLINICAL DECISION MAKING: Evolving/moderate complexity  EVALUATION COMPLEXITY: Moderate  PLAN:  PT FREQUENCY: 1-2x/week  PT DURATION: 12 weeks  PLANNED INTERVENTIONS: [id number]- PT  Re-evaluation, 97750- Physical Performance Testing, 97110-Therapeutic exercises, 97530- Therapeutic activity, [id number]- Neuromuscular re-education, 97535- Self Care, [id number]- Manual therapy, [phone]- Gait training, [phone]- Electrical stimulation (unattended), [phone]- Electrical stimulation (manual), Patient/Family education, Balance training, Stair training, Taping, Joint mobilization, Cryotherapy, and Moist heat  PLAN FOR NEXT SESSION:  -Continue working on gait with turns.  -Continue working to improve functional movement, gait, transfers, and L shoulder strength/mobility.  -Continue working on gait speed and step length.     Prudence Heiny  Leopoldo PT, DPT Physical Therapist - [facility]  Outpatient Physical Therapy- Main Campus [phone]     Mahoganie Basher, PT 03/25/2024, 11:46 AM        "

## 2024-03-25 NOTE — Therapy (Signed)
 " OUTPATIENT SPEECH LANGUAGE PATHOLOGY  SPEECH LANGUAGE TREATMENT NOTE    Patient Name: Gregory Lane MRN: [id number] DOB:[birth year], 74 y.o., male 63 Date: 03/25/2024   PCP: Denyse DELENA Bathe, MD REFERRING PROVIDER: Arland Earnie Pouch, MD     End of Session - 03/25/24 1615     Visit Number 49    Number of Visits 64    Date for Recertification  05/06/24    Authorization Type [employer] [place]    Progress Note Due on Visit 50    SLP Start Time 1145    SLP Stop Time  1230    SLP Time Calculation (min) 45 min    Activity Tolerance Patient tolerated treatment well          Past Medical History:  Diagnosis Date   Anxiety    Aortic atherosclerosis 02/16/2017   Arthritis    Blind left eye    COPD (chronic obstructive pulmonary disease) (HCC)    Depression    Dysrhythmia    A-fib   GERD (gastroesophageal reflux disease)    Hypertension    Low TSH level 02/17/2017   Myocardial infarction [facility])    PAD (peripheral artery disease)    Paroxysmal atrial fibrillation (HCC)    Perforated duodenal ulcer (HCC)    Stroke (HCC)    left side is weak   Past Surgical History:  Procedure Laterality Date   BRONCHIAL BIOPSY  03/22/2022   Procedure: BRONCHIAL BIOPSIES;  Surgeon: Isadora Hose, MD;  Location: [facility] ENDOSCOPY;  Service: Pulmonary;;   BRONCHIAL NEEDLE ASPIRATION BIOPSY  03/22/2022   Procedure: BRONCHIAL NEEDLE ASPIRATION BIOPSIES;  Surgeon: Isadora Hose, MD;  Location: [facility] ENDOSCOPY;  Service: Pulmonary;;   ENDOBRONCHIAL ULTRASOUND  03/22/2022   Procedure: ENDOBRONCHIAL ULTRASOUND;  Surgeon: Isadora Hose, MD;  Location: [facility] ENDOSCOPY;  Service: Pulmonary;;   EXPLORATORY LAPAROTOMY  05/12/2015   EYE SURGERY     FRACTURE SURGERY     LAPAROTOMY N/A 05/10/2015   Procedure: EXPLORATORY LAPAROTOMY;  Surgeon: Lynda Leos, MD;  Location: [facility] OR;  Service: General;  Laterality: N/A;   LEFT HEART CATH AND CORONARY ANGIOGRAPHY N/A 05/02/2016   Procedure: Left Heart Cath  and Coronary Angiography;  Surgeon: Gordy Bergamo, MD;  Location: [facility] INVASIVE CV LAB;  Service: Cardiovascular;  Laterality: N/A;   LOWER EXTREMITY ANGIOGRAPHY Left 09/21/2021   Procedure: Lower Extremity Angiography;  Surgeon: Jama Cordella KANDICE, MD;  Location: [facility] INVASIVE CV LAB;  Service: Cardiovascular;  Laterality: Left;   Patient Active Problem List   Diagnosis Date Noted   Aphasia 12/31/2023   Arthritis of left shoulder 12/31/2023   Allergic rhinitis due to pollen 12/11/2022   Tinea cruris 12/11/2022   BPH associated with nocturia 12/11/2022   Chest pain, non-cardiac 04/11/2022   Adenocarcinoma of lower lobe of right lung (HCC) 03/28/2022   Pulmonary nodule 1 cm or greater in diameter 03/22/2022   Leg weakness, bilateral 02/09/2022   Osteopenia 10/13/2021   PAD (peripheral artery disease) 09/21/2021   GERD without esophagitis 09/21/2021   Dyslipidemia 09/21/2021   Depression 09/21/2021   Atrial flutter (HCC) 09/21/2021   Chronic obstructive pulmonary disease (COPD) (HCC) 09/21/2021   Coronary artery disease 09/21/2021   Cerebrovascular accident (CVA) due to embolism of cerebral artery (HCC) 09/06/2021   Bilateral edema of lower extremity 08/05/2020   Vasovagal syncope 08/02/2020   Annual physical exam 12/26/2019   Hyperlipidemia 12/26/2019   Abrasion of right hand 12/26/2019   Metabolic syndrome 11/11/2019   Low TSH level  02/17/2017   Atrial flutter with rapid ventricular response (HCC) 02/16/2017   Cigarette smoker 02/16/2017   COPD with acute exacerbation (HCC) 02/16/2017   Elevated lactic acid level 02/16/2017   Aortic atherosclerosis 02/16/2017   Productive cough    Hypoxia 05/24/2015   SOB (shortness of breath) 05/24/2015   COPD suggested by initial evaluation 05/17/2015   Essential hypertension 05/17/2015   Smoking 05/17/2015    ONSET DATE: 08/09/2023   REFERRING DIAG: I63.9 (ICD-10-CM) - Cerebral infarction, unspecified   THERAPY DIAG:  Aphasia  Rationale  for Evaluation and Treatment Rehabilitation  SUBJECTIVE:   PERTINENT HISTORY and DIAGNOSTIC FINDINGS: Pt is a 74 year old right handed male who [facility] ED on 08/09/2023 with stroke-like symptoms - slurred speech and difficulty answering questions and weakness to his right side extremities. CT head and subsequent CTA head and neck shows left M2 occlusion with large penumbra in the region. Pt transferred to [facility] for mechanical thrombectomy on 08/10/2023. Pt admitted at [facility] from 08/10/2023 thru 08/21/2023 with recommended for home health.   MRI brain: Remonstrated left MCA territory infarct primarily involving the parietal lobe.   Additional medical history includes: blindness left eye, A-fib on warfarin, COPD, HFpEF, HTN, current smoker, ethanol abuse, RLL adenocarcinoma s/p SBRT (2024)    PAIN:  Are you having pain? Unable to communicate  FALLS: Has patient fallen in last 6 months?  No  LIVING ENVIRONMENT: Lives with: sister-in-law Lives in: House/apartment  PLOF:  Level of assistance: Independent with ADLs, Independent with IADLs, Comment: poor health literacy, difficulty with reading and writing per family report Employment: Retired   PATIENT GOALS    per pt's sister-in-law, she hopes he will talk again  SUBJECTIVE STATEMENT: Pt pleasant  Pt accompanied by: sister-in-law  OBJECTIVE:   TODAY'S TREATMENT:  Skilled ST treatment session targeted pt's aphasia and apraxia of speech goals. SLP facilitated session by providing the following interventions:  To increase comprehension skills, pictures of common foods items that pt likes were paired with their words and presented in the following order: SLP verbally presented each picture with written word. Pt then said each word with the clinician intelligibility in 31 out of 50 opportunities. Additional photos of tomatoes and okra, bologna, tuna sandwich, chips, potted meat, hotdogs  Following Directions Hierarchy  #4 Point,  in sequence, to 2 common objects by function: 7 out 10 independently   Constant Therapy Clinician App utilized to target receptive language abilities Understand words you hear: Level - 1 10 out of 10: Level 2 - 10 out of 10 Read a word you hear: Level 1- 11 out of 20; improved to 18 out of 20 with moderate verbal cues  PATIENT EDUCATION: Education details: see above Person educated: Patient and his sister-in-law Education method: Explanation Education comprehension: needs further education  HOME EXERCISE PROGRAM:   Supplement verbal information with single written words  Continue practicing with pictures and written cards  GOALS:  Goals reviewed with patient? Yes  SHORT TERM GOALS: Target date: 10 sessions Updated 02/12/2024  Updated 12/25/2023   4. With minimal assistance, pt will point to 1 common object by function in a field of 3 with 90% accuracy.  Baseline:  Goal status: INITIAL: progress made: With minimal assistance, pt will point to 1 common object by function in a field of 3 with 80% accuracy in 3 out of 5 sessions.   MET  5. With minimal to moderate assistance, pt will point, in sequence to 2 common objects  by function in field of 3 with 90% accuracy.   Baseline:  Goal status: INITIAL: progress made: With minimal to moderate assistance, pt will point, in sequence to 2 common objects by function in field of 3 with 80% accuracy in 3 out of 5 sessions.   MET  6. With minimal to moderate assistance pt will verablly approximate names of basic functional objects with > 75% speech intelligiblity at the word level.    Baseline:  Goal status: INITIAL: progress made, continue targeting: With minimal to moderate assistance pt with verablly approximate names of basic functional objects with > 75% speech intelligiblity at the word level in 3 out of 5 sessions.  PROGRESS MADE  9. With minimal assistance, pt will point, in sequence, to 2 common objects by name from a field of 3 with  80% accuracy across 3 consecutive sessions.  Baseline:  Goal status: INITIAL: PROGRESS MADE  LONG TERM GOALS: Target date: 05/06/2024  Updated: 02/12/2024 Updated 12/25/2023  1. Pt will use multimodal communication and maximal assistance to communicate basic wants and needs in 2 out of 5 opportunities per pt and family report.  Baseline:  Goal status: INITIAL: progress, continue targeting  2.  With Maximal A, patient/family will demonstrate understanding of the following concepts: aphasia, spontaneous recovery, communication vs conversation, strengths/strategies to promote success, local resources by answering multiple choice questions with 50% accuracy when provided supported conversation in order to increase patient and caregiver's participation in medical care.  Baseline:  Goal status: INITIAL: progress, continue targeting   ASSESSMENT:  CLINICAL IMPRESSION: Patient is a 74 y.o. right handed male who was seen today for a speech language treatment d/t left MCA CVA.   Pt presents with improving language impairment, now conceptualized as moderate to severe Wernicke's Aphasia (as per WAB-R, above).   Verbal Communication Pt's verbal communication continues to be fluent but increased number of islands of intelligible speech especially when commenting.   Auditory Comprehension Pt with improved auditory comprehension as evidenced by his ability to select objects in field of 4, answer basic to progressing semi-complex yes/no questions and following 1 step directions.   Written Language Pt with improved letter recognition and ability to copy written words.   Reading Comprehension Continues to be a strength for pt. Utilized to supplement receptive language deficits.   See the above treatment note for details.   OBJECTIVE IMPAIRMENTS include expressive language, receptive language, aphasia, apraxia, and written language. These impairments are limiting patient from managing medications,  managing appointments, managing finances, household responsibilities, ADLs/IADLs, and effectively communicating at home and in community. Factors affecting potential to achieve goals and functional outcome are severity of impairments, family/community support, and significantly reduced health literacy. Patient will benefit from skilled SLP services to address above impairments and improve overall function.  REHAB POTENTIAL: Good  PLAN: SLP FREQUENCY: 1-2x/week  SLP DURATION: 12 weeks  PLANNED INTERVENTIONS: Language facilitation, Cueing hierachy, Internal/external aids, Functional tasks, Multimodal communication approach, SLP instruction and feedback, Compensatory strategies, and Patient/family education    Jolissa Kapral B. Rubbie, M.S., CCC-SLP, CBIS Speech-Language Pathologist Certified Brain Injury Specialist [facility]  [facility] [phone] Ascom [phone] Fax [phone]   "

## 2024-03-27 ENCOUNTER — Ambulatory Visit: Admitting: Speech Pathology

## 2024-03-27 ENCOUNTER — Ambulatory Visit

## 2024-03-27 DIAGNOSIS — R41841 Cognitive communication deficit: Secondary | ICD-10-CM

## 2024-03-27 DIAGNOSIS — R4701 Aphasia: Secondary | ICD-10-CM

## 2024-03-27 DIAGNOSIS — R278 Other lack of coordination: Secondary | ICD-10-CM

## 2024-03-27 DIAGNOSIS — R482 Apraxia: Secondary | ICD-10-CM

## 2024-03-27 DIAGNOSIS — M25512 Pain in left shoulder: Secondary | ICD-10-CM

## 2024-03-27 DIAGNOSIS — M6281 Muscle weakness (generalized): Secondary | ICD-10-CM

## 2024-03-27 DIAGNOSIS — R2681 Unsteadiness on feet: Secondary | ICD-10-CM

## 2024-03-27 NOTE — Therapy (Signed)
 " OUTPATIENT PHYSICAL THERAPY NEURO TREATMENT   Patient Name: Gregory Lane MRN: [id number] DOB:[birth year], 74 y.o., male 20 Date: 03/27/2024   PCP: Fernand Denyse LABOR, MD  REFERRING PROVIDER: Albina GORMAN Dine, MD   END OF SESSION:  PT End of Session - 03/27/24 1056     Visit Number 15    Number of Visits 25    Date for Recertification  04/15/24    PT Start Time 1100    PT Stop Time 1143    PT Time Calculation (min) 43 min    Equipment Utilized During Treatment Gait belt    Activity Tolerance Patient tolerated treatment well    Behavior During Therapy [facility] for tasks assessed/performed             Past Medical History:  Diagnosis Date   Anxiety    Aortic atherosclerosis 02/16/2017   Arthritis    Blind left eye    COPD (chronic obstructive pulmonary disease) (HCC)    Depression    Dysrhythmia    A-fib   GERD (gastroesophageal reflux disease)    Hypertension    Low TSH level 02/17/2017   Myocardial infarction [facility])    PAD (peripheral artery disease)    Paroxysmal atrial fibrillation (HCC)    Perforated duodenal ulcer (HCC)    Stroke (HCC)    left side is weak   Past Surgical History:  Procedure Laterality Date   BRONCHIAL BIOPSY  03/22/2022   Procedure: BRONCHIAL BIOPSIES;  Surgeon: Isadora Hose, MD;  Location: [facility] ENDOSCOPY;  Service: Pulmonary;;   BRONCHIAL NEEDLE ASPIRATION BIOPSY  03/22/2022   Procedure: BRONCHIAL NEEDLE ASPIRATION BIOPSIES;  Surgeon: Isadora Hose, MD;  Location: [facility] ENDOSCOPY;  Service: Pulmonary;;   ENDOBRONCHIAL ULTRASOUND  03/22/2022   Procedure: ENDOBRONCHIAL ULTRASOUND;  Surgeon: Isadora Hose, MD;  Location: [facility] ENDOSCOPY;  Service: Pulmonary;;   EXPLORATORY LAPAROTOMY  05/12/2015   EYE SURGERY     FRACTURE SURGERY     LAPAROTOMY N/A 05/10/2015   Procedure: EXPLORATORY LAPAROTOMY;  Surgeon: Lynda Leos, MD;  Location: [facility] OR;  Service: General;  Laterality: N/A;   LEFT HEART CATH AND CORONARY ANGIOGRAPHY N/A 05/02/2016    Procedure: Left Heart Cath and Coronary Angiography;  Surgeon: Gordy Bergamo, MD;  Location: [facility] INVASIVE CV LAB;  Service: Cardiovascular;  Laterality: N/A;   LOWER EXTREMITY ANGIOGRAPHY Left 09/21/2021   Procedure: Lower Extremity Angiography;  Surgeon: Jama Cordella KANDICE, MD;  Location: [facility] INVASIVE CV LAB;  Service: Cardiovascular;  Laterality: Left;   Patient Active Problem List   Diagnosis Date Noted   Aphasia 12/31/2023   Arthritis of left shoulder 12/31/2023   Allergic rhinitis due to pollen 12/11/2022   Tinea cruris 12/11/2022   BPH associated with nocturia 12/11/2022   Chest pain, non-cardiac 04/11/2022   Adenocarcinoma of lower lobe of right lung (HCC) 03/28/2022   Pulmonary nodule 1 cm or greater in diameter 03/22/2022   Leg weakness, bilateral 02/09/2022   Osteopenia 10/13/2021   PAD (peripheral artery disease) 09/21/2021   GERD without esophagitis 09/21/2021   Dyslipidemia 09/21/2021   Depression 09/21/2021   Atrial flutter (HCC) 09/21/2021   Chronic obstructive pulmonary disease (COPD) (HCC) 09/21/2021   Coronary artery disease 09/21/2021   Cerebrovascular accident (CVA) due to embolism of cerebral artery (HCC) 09/06/2021   Bilateral edema of lower extremity 08/05/2020   Vasovagal syncope 08/02/2020   Annual physical exam 12/26/2019   Hyperlipidemia 12/26/2019   Abrasion of right hand 12/26/2019   Metabolic syndrome 11/11/2019   Low  TSH level 02/17/2017   Atrial flutter with rapid ventricular response (HCC) 02/16/2017   Cigarette smoker 02/16/2017   COPD with acute exacerbation (HCC) 02/16/2017   Elevated lactic acid level 02/16/2017   Aortic atherosclerosis 02/16/2017   Productive cough    Hypoxia 05/24/2015   SOB (shortness of breath) 05/24/2015   COPD suggested by initial evaluation 05/17/2015   Essential hypertension 05/17/2015   Smoking 05/17/2015    ONSET DATE: Unsure, a long time   REFERRING DIAG:  M19.012 (ICD-10-CM) - Arthritis of left shoulder   I63.40 (ICD-10-CM) - Cerebrovascular accident (CVA) due to embolism of cerebral artery (HCC)     THERAPY DIAG:  Unsteadiness on feet  Muscle weakness (generalized)  Left shoulder pain, unspecified chronicity  Aphasia  Other lack of coordination  Apraxia  Cognitive communication deficit  Rationale for Evaluation and Treatment: Rehabilitation  SUBJECTIVE:                                                                                                                                                                                             SUBJECTIVE STATEMENT:  Patient's sister reports that patient was tired today. He reports doing okay.    At Eval: Patient's sister in-law was present to assist with subjective portion of evaluation. Patient had CVA in June effecting R side. He did not have PT treatment following CVA initially due to focusing on speech first. Patient was noted to have damage to the Wernicke's Area causing aphasia making communication slightly difficult during today's evaluation. Patient walks without AD at home, but has decreased balance when walking. His sister in-law reports the R leg gave out last week and he fell in bedroom. He has also fallen in past when performing activities on uneven surfaces such as taking out garbage. He does do a little yard work such as picking up limbs and helping out in yard, but not much.   Patient reports that he has had L shoulder pain for a long time. Cannot say if he has had any previous injuries and does not think he has had imaging of shoulder. He expressed having sharp pain with movement of L shoulder from the shoulder to the elbow.    Pt accompanied by: Sister in-law.   PERTINENT HISTORY: CVA effecting L side in June 2025, Wernicke's aphasia, R shoulder pain with movement.   PAIN:  Are you having pain? 9/10, but has difficulty describing.   PRECAUTIONS: Fall and Other: Decreased safety awareness.  RED  FLAGS: None   WEIGHT BEARING RESTRICTIONS: No  FALLS: Has patient fallen in last 6 months? Yes. Number of falls Patient is  unsure.   LIVING ENVIRONMENT: Lives with: lives with their family Lives in: House/apartment Stairs: Yes: External: 4 steps; on right going up and on left going up Has following equipment at home: None  PLOF: Independent  PATIENT GOALS: Improve L shoulder function/pain levels; Improve balance with gait/ADLs.   OBJECTIVE:  Note: Objective measures were completed at Evaluation unless otherwise noted.  DIAGNOSTIC FINDINGS:   COMPARISON:  CT head 09/06/2021.   FINDINGS: Brain: Encephalomalacia in the anterior right temporal and frontal lobes. Smaller area of encephalomalacia in the inferior left frontal lobe. Remote lacunar infarct in the posterior limb of the left internal capsule. No evidence of acute large vascular territory, acute hemorrhage, mass lesion or midline shift.  COGNITION: Overall cognitive status: Wernicke's Aphasia    SENSATION: Not tested  COORDINATION: Decreased speed with toe taps.     POSTURE: rounded shoulders, forward head, and increased thoracic kyphosis   LOWER EXTREMITY MMT:    MMT Right Eval Left Eval  Hip flexion 4 3+  Hip extension    Hip abduction 5 5  Hip adduction    Hip internal rotation    Hip external rotation    Knee flexion 5 4+  Knee extension 5 4  Ankle dorsiflexion 5 5  Ankle plantarflexion    Ankle inversion    Ankle eversion    (Blank rows = not tested)  UPPER EXTREMITY MMT:  MMT Right eval Left eval  Shoulder flexion 5 4-  Shoulder extension    Shoulder abduction 5 4  Shoulder adduction    Shoulder extension    Shoulder internal rotation 5 5  Shoulder external rotation 5 4  Middle trapezius    Lower trapezius    Elbow flexion    Elbow extension    Wrist flexion    Wrist extension    Wrist ulnar deviation    Wrist radial deviation    Wrist pronation    Wrist supination     Grip strength     (Blank rows = not tested)   UPPER EXTREMITY ROM:  Active ROM Right eval Left eval  Shoulder flexion 155 108  Shoulder extension    Shoulder abduction 160 90  Shoulder adduction    Shoulder extension    Shoulder internal rotation [facility]  Shoulder external rotation 57 40  Elbow flexion    Elbow extension    Wrist flexion    Wrist extension    Wrist ulnar deviation    Wrist radial deviation    Wrist pronation    Wrist supination     (Blank rows = not tested)  BED MOBILITY:  Not tested  TRANSFERS: Sit to stand: Complete Independence and CGA  Assistive device utilized: Use of one upper extremity to assist trunk lift.      RAMP:  Not tested  CURB:  Not tested  STAIRS: Not tested GAIT: Findings: Gait Characteristics: decreased arm swing- Right, decreased arm swing- Left, decreased step length- Right, decreased step length- Left, and decreased stride length, Distance walked: 50', and Comments: .  FUNCTIONAL TESTS:  5 times sit to stand: 27.7'' Timed up and go (TUG): 65'' 10 meter walk test: 45''   PATIENT SURVEYS:  Unable to perform survey due to patient's cognition.  TREATMENT DATE: 03/27/24   TE- To improve strength, endurance, mobility, and function of specific targeted muscle groups or improve joint range of motion or improve muscle flexibility  -Seated L shoulder flexion: 2x15  -Seated rows: GTB 2x15  -Seated Reverse Fly: GTB 2x10    TA- To improve functional movements patterns for everyday tasks    Ambulate 320 ft with with cues for foot clearance , close CGA x1  Single foot lift with 3/4 lb. Wt. On forefoot over 6'' hurdle and drop wt. In bucket on opposite side. X20 each side.   -Performed second trial with 8'' hurdle. (Increased errors with lack of clearance).    Forward step over orange hurdle 10x each  LE with UE support  STS 10x   Single step the width of agility ladder to improve step length with visual/verb cuing.  PATIENT EDUCATION: Education details: Patient educated on exercises to perform at home to improve L shoulder mobility and strength with proper mechanics/positioning. Person educated: Patient and sister in-law Education method: Explanation, Demonstration, Verbal cues, and Handouts Education comprehension: returned demonstration  HOME EXERCISE PROGRAM:  Access Code: [email or web address] URL: [email or web address] Date: 01/22/2024 Prepared by: Norman Sharps  Exercises - Seated Shoulder External Rotation AAROM with Cane and Hand in Neutral  - 1 x daily - 7 x weekly - 1 sets - 15 reps - Seated Scapular Retraction  - 1 x daily - 7 x weekly - 1 sets - 15 reps - Supine Shoulder Flexion Extension AAROM with Dowel  - 1 x daily - 7 x weekly - 1 sets - 15 reps - Supine Shoulder Press with Dowel  - 1 x daily - 7 x weekly - 1 sets - 15 reps - Seated Shoulder Flexion AAROM with Dowel  - 1 x daily - 7 x weekly - 1 sets - 15 reps  GOALS: Goals reviewed with patient? Yes  SHORT TERM GOALS: Target date: 02/19/2024  Patient will be independent with HEP allowing him to perform exercises at home with proper positioning/mechanics/safety.  Baseline: HEP initiated at evaluation with handout.  Goal status: INITIAL    LONG TERM GOALS: Target date: 04/15/2024  1.  Patient (> 75 years old) will complete five times sit to stand test in < 15 seconds indicating an increased LE strength and improved balance. Baseline: 27.7'' 03/06/2024: 17.58''  Goal status: INITIAL  2.  Patient will improve gait mechanics showing good arm swing, foot clearance and increased stride length allowing him to ambulate more efficiently. Baseline: Decreased UE movement, lack of bilateral foot clearance during swing phase, decreased stride length, decreased gait speed. Goal status: INITIAL   3.  Patient will  reduce timed up and go to <11 seconds to reduce fall risk and demonstrate improved transfer/gait ability. Baseline: 65'' without AD. 03/06/2024: 25.66'' without AD. Goal status: Progressing  4.  Patient will increase 10 meter walk test to >1.41m/s as to improve gait speed for better community ambulation and to reduce fall risk. Baseline: 45'' Goal status: 15.45'' = 0.64 m/s without AD.   5.  Patient will improve L shouder flexion/abduction AROM to match that of the R by discharge allowing him to reach objects at greater heights such as in kitchen cabinets.  Baseline: Flexion - 108 degrees; Abduction - 90 degrees.  Goal status: INITIAL  6.  Patient will improve L shoulder strength to 4+/5 MMT by discharge allowing him to hold and carry items in L upper extremity with decreased difficulty.  Baseline: See MMT table.  Goal status: INITIAL   ASSESSMENT:  CLINICAL IMPRESSION:  Patient continues to have decreased foot clearance and step length noted during gait today. Focused on improving both aspects of gait with various exercises giving both verbal and visual cues with increased repetitions to promote hip and DF with step.  Overall, he continues to perform well with PT showing improvement in L shoulder mobility/overall mobility. Patient may benefit from continuing skilled PT at this time.   OBJECTIVE IMPAIRMENTS: Abnormal gait, decreased activity tolerance, decreased balance, decreased coordination, difficulty walking, decreased ROM, decreased strength, impaired UE functional use, and pain.   ACTIVITY LIMITATIONS: carrying, lifting, standing, squatting, stairs, and transfers  PARTICIPATION LIMITATIONS: meal prep, cleaning, laundry, interpersonal relationship, and yard work  PERSONAL FACTORS: Age, Past/current experiences, and 3+ comorbidities: Stroke, A -fib, PAD, HTN, hx. Of MI, COPD, blind in L eye. are also affecting patient's functional outcome.   REHAB POTENTIAL: Fair Due to patient's  difficulty with communication.  CLINICAL DECISION MAKING: Evolving/moderate complexity  EVALUATION COMPLEXITY: Moderate  PLAN:  PT FREQUENCY: 1-2x/week  PT DURATION: 12 weeks  PLANNED INTERVENTIONS: 97164- PT Re-evaluation, 97750- Physical Performance Testing, 97110-Therapeutic exercises, 97530- Therapeutic activity, [id number]- Neuromuscular re-education, 97535- Self Care, [id number]- Manual therapy, [phone]- Gait training, [phone]- Electrical stimulation (unattended), [phone]- Electrical stimulation (manual), Patient/Family education, Balance training, Stair training, Taping, Joint mobilization, Cryotherapy, and Moist heat  PLAN FOR NEXT SESSION:  -Continue working on gait with turns.  -Continue working to improve functional movement, gait, transfers, and L shoulder strength/mobility.  -Continue working on gait speed and step length.    Norman Sharps, PT, DPT Physical Therapist - [facility]   Norman KATHEE Sharps, [place] 03/27/2024, 11:45 AM        "

## 2024-03-27 NOTE — Therapy (Signed)
 " OUTPATIENT SPEECH LANGUAGE PATHOLOGY  SPEECH LANGUAGE TREATMENT NOTE  10th VISIT PROGRESS NOTE   Patient Name: Gregory Lane MRN: [id number] DOB:[birth year], 74 y.o., male Today's Date: 03/27/2024   PCP: Denyse DELENA Bathe, MD REFERRING PROVIDER: Arland Earnie Pouch, MD  Speech Therapy Progress Note  Dates of Reporting Period: 02/19/2024 to 03/27/2024  Objective: Patient has been seen for 10 speech therapy sessions this reporting period targeting pt's Wernicke's Aphasia. Patient is making progress toward LTGs and met 3 of 5 STGs this reporting period. See skilled intervention, clinical impressions, and goals below for details.    End of Session - 03/27/24 1148     Visit Number 50    Number of Visits 64    Date for Recertification  05/06/24    Authorization Type [employer]    Progress Note Due on Visit 50    SLP Start Time 1145    SLP Stop Time  1230    SLP Time Calculation (min) 45 min    Activity Tolerance Patient tolerated treatment well          Past Medical History:  Diagnosis Date   Anxiety    Aortic atherosclerosis 02/16/2017   Arthritis    Blind left eye    COPD (chronic obstructive pulmonary disease) (HCC)    Depression    Dysrhythmia    A-fib   GERD (gastroesophageal reflux disease)    Hypertension    Low TSH level 02/17/2017   Myocardial infarction [facility])    PAD (peripheral artery disease)    Paroxysmal atrial fibrillation (HCC)    Perforated duodenal ulcer (HCC)    Stroke (HCC)    left side is weak   Past Surgical History:  Procedure Laterality Date   BRONCHIAL BIOPSY  03/22/2022   Procedure: BRONCHIAL BIOPSIES;  Surgeon: Isadora Hose, MD;  Location: [facility] ENDOSCOPY;  Service: Pulmonary;;   BRONCHIAL NEEDLE ASPIRATION BIOPSY  03/22/2022   Procedure: BRONCHIAL NEEDLE ASPIRATION BIOPSIES;  Surgeon: Isadora Hose, MD;  Location: [facility] ENDOSCOPY;  Service: Pulmonary;;   ENDOBRONCHIAL ULTRASOUND  03/22/2022   Procedure: ENDOBRONCHIAL ULTRASOUND;   Surgeon: Isadora Hose, MD;  Location: [facility] ENDOSCOPY;  Service: Pulmonary;;   EXPLORATORY LAPAROTOMY  05/12/2015   EYE SURGERY     FRACTURE SURGERY     LAPAROTOMY N/A 05/10/2015   Procedure: EXPLORATORY LAPAROTOMY;  Surgeon: Lynda Leos, MD;  Location: [facility] OR;  Service: General;  Laterality: N/A;   LEFT HEART CATH AND CORONARY ANGIOGRAPHY N/A 05/02/2016   Procedure: Left Heart Cath and Coronary Angiography;  Surgeon: Gordy Bergamo, MD;  Location: [facility] INVASIVE CV LAB;  Service: Cardiovascular;  Laterality: N/A;   LOWER EXTREMITY ANGIOGRAPHY Left 09/21/2021   Procedure: Lower Extremity Angiography;  Surgeon: Jama Cordella KANDICE, MD;  Location: [facility] INVASIVE CV LAB;  Service: Cardiovascular;  Laterality: Left;   Patient Active Problem List   Diagnosis Date Noted   Aphasia 12/31/2023   Arthritis of left shoulder 12/31/2023   Allergic rhinitis due to pollen 12/11/2022   Tinea cruris 12/11/2022   BPH associated with nocturia 12/11/2022   Chest pain, non-cardiac 04/11/2022   Adenocarcinoma of lower lobe of right lung (HCC) 03/28/2022   Pulmonary nodule 1 cm or greater in diameter 03/22/2022   Leg weakness, bilateral 02/09/2022   Osteopenia 10/13/2021   PAD (peripheral artery disease) 09/21/2021   GERD without esophagitis 09/21/2021   Dyslipidemia 09/21/2021   Depression 09/21/2021   Atrial flutter (HCC) 09/21/2021   Chronic obstructive pulmonary disease (COPD) (HCC) 09/21/2021  Coronary artery disease 09/21/2021   Cerebrovascular accident (CVA) due to embolism of cerebral artery (HCC) 09/06/2021   Bilateral edema of lower extremity 08/05/2020   Vasovagal syncope 08/02/2020   Annual physical exam 12/26/2019   Hyperlipidemia 12/26/2019   Abrasion of right hand 12/26/2019   Metabolic syndrome 11/11/2019   Low TSH level 02/17/2017   Atrial flutter with rapid ventricular response (HCC) 02/16/2017   Cigarette smoker 02/16/2017   COPD with acute exacerbation (HCC) 02/16/2017   Elevated lactic  acid level 02/16/2017   Aortic atherosclerosis 02/16/2017   Productive cough    Hypoxia 05/24/2015   SOB (shortness of breath) 05/24/2015   COPD suggested by initial evaluation 05/17/2015   Essential hypertension 05/17/2015   Smoking 05/17/2015    ONSET DATE: 08/09/2023   REFERRING DIAG: I63.9 (ICD-10-CM) - Cerebral infarction, unspecified   THERAPY DIAG:  Aphasia  Rationale for Evaluation and Treatment Rehabilitation  SUBJECTIVE:   PERTINENT HISTORY and DIAGNOSTIC FINDINGS: Pt is a 74 year old right handed male who [facility] ED on 08/09/2023 with stroke-like symptoms - slurred speech and difficulty answering questions and weakness to his right side extremities. CT head and subsequent CTA head and neck shows left M2 occlusion with large penumbra in the region. Pt transferred to [facility] for mechanical thrombectomy on 08/10/2023. Pt admitted at [facility] from 08/10/2023 thru 08/21/2023 with recommended for home health.   MRI brain: Remonstrated left MCA territory infarct primarily involving the parietal lobe.   Additional medical history includes: blindness left eye, A-fib on warfarin, COPD, HFpEF, HTN, current smoker, ethanol abuse, RLL adenocarcinoma s/p SBRT (2024)    PAIN:  Are you having pain? Unable to communicate  FALLS: Has patient fallen in last 6 months?  No  LIVING ENVIRONMENT: Lives with: sister-in-law Lives in: House/apartment  PLOF:  Level of assistance: Independent with ADLs, Independent with IADLs, Comment: poor health literacy, difficulty with reading and writing per family report Employment: Retired   PATIENT GOALS    per pt's sister-in-law, she hopes he will talk again  SUBJECTIVE STATEMENT: Pt pleasant  Pt accompanied by: sister-in-law  OBJECTIVE:   TODAY'S TREATMENT:  Skilled ST treatment session targeted pt's aphasia and apraxia of speech goals. SLP facilitated session by providing the following interventions:  Following Directions Hierarchy  using pictures of pt's favorite foods #4 Point, in sequence, to 2 common objects by name (from field of 3): 100% #5 Point to 1 object spelled (from field of 3): 9 out of 12 improving to 12 out of 12 with repetition #6 Point to 1 object described with 3 descriptors (one word descriptors) from field of 3): 100% Constant Therapy Clinician App utilized to target receptive language abilities Understand words you hear: Level - 1 10 out of 10: Level 2 - 10 out of 10 Read a word you hear: Level 1- 11 out of 20; improved to 18 out of 20 with moderate verbal cues  PATIENT EDUCATION: Education details: see above Person educated: Patient and his sister-in-law Education method: Explanation Education comprehension: needs further education  HOME EXERCISE PROGRAM:   Supplement verbal information with single written words  Continue practicing with pictures and written cards  GOALS:  Goals reviewed with patient? Yes  SHORT TERM GOALS: Target date: 10 sessions Updated 02/12/2024  Updated 12/25/2023 Updated 03/27/2024   6. With minimal to moderate assistance pt with verablly approximate names of basic functional objects with > 75% speech intelligiblity at the word level in 3 out of 5 sessions.  Baseline:  Goal status: INITIAL: PROGRESS MADE  9. Pt will independently point to 1 object spelled from field of 3 with 95% accuracy across 3 consecutive sessions.  Baseline:  Goal status: INITIAL  10. Pt will independently point to 1 object described with 3 one word descriptors from field of 3 with 90% accuracy across 3 consecutive sessions.   Baseline:  Goal Status: INITIAL  LONG TERM GOALS: Target date: 05/06/2024  Updated: 02/12/2024 Updated 12/25/2023 Updated 03/27/2024  1. Pt will use multimodal communication and maximal assistance to communicate basic wants and needs in 2 out of 5 opportunities per pt and family report.  Baseline:  Goal status: INITIAL: progress, continue targeting  2.  With  Maximal A, patient/family will demonstrate understanding of the following concepts: aphasia, spontaneous recovery, communication vs conversation, strengths/strategies to promote success, local resources by answering multiple choice questions with 50% accuracy when provided supported conversation in order to increase patient and caregiver's participation in medical care.  Baseline:  Goal status: INITIAL: progress, continue targeting   ASSESSMENT:  CLINICAL IMPRESSION: Patient is a 74 y.o. right handed male who was seen today for a speech language treatment d/t left MCA CVA.   Pt presents with improving language impairment, now conceptualized as moderate to severe Wernicke's Aphasia (as per WAB-R, above).   Verbal Communication Pt's verbal communication continues to be fluent with increased number of islands of intelligible speech especially during off the cuff comments and social greetings.   Auditory Comprehension Pt with improved auditory comprehension as evidenced by his ability to select objects in field of 4, answer basic to progressing semi-complex yes/no questions and following 1 step directions.   Written Language Pt with improved letter recognition and ability to copy written words.   Reading Comprehension Continues to be a strength for pt. Utilized to supplement receptive language deficits.   See the above treatment note for details.   OBJECTIVE IMPAIRMENTS include expressive language, receptive language, aphasia, apraxia, and written language. These impairments are limiting patient from managing medications, managing appointments, managing finances, household responsibilities, ADLs/IADLs, and effectively communicating at home and in community. Factors affecting potential to achieve goals and functional outcome are severity of impairments, family/community support, and significantly reduced health literacy. Patient will benefit from skilled SLP services to address above  impairments and improve overall function.  REHAB POTENTIAL: Good  PLAN: SLP FREQUENCY: 1-2x/week  SLP DURATION: 12 weeks  PLANNED INTERVENTIONS: Language facilitation, Cueing hierachy, Internal/external aids, Functional tasks, Multimodal communication approach, SLP instruction and feedback, Compensatory strategies, and Patient/family education    Bocephus Cali B. Rubbie, M.S., CCC-SLP, CBIS Speech-Language Pathologist Certified Brain Injury Specialist [facility]  [facility] [phone] Ascom [phone] Fax [phone]   "

## 2024-04-01 ENCOUNTER — Ambulatory Visit: Admitting: Speech Pathology

## 2024-04-01 ENCOUNTER — Ambulatory Visit

## 2024-04-03 ENCOUNTER — Ambulatory Visit: Admitting: Speech Pathology

## 2024-04-03 ENCOUNTER — Ambulatory Visit

## 2024-04-08 ENCOUNTER — Ambulatory Visit

## 2024-04-08 ENCOUNTER — Ambulatory Visit: Admitting: Speech Pathology

## 2024-04-10 ENCOUNTER — Ambulatory Visit

## 2024-04-10 ENCOUNTER — Ambulatory Visit: Admitting: Speech Pathology

## 2024-04-14 ENCOUNTER — Ambulatory Visit: Admitting: Internal Medicine

## 2024-04-15 ENCOUNTER — Ambulatory Visit

## 2024-04-15 ENCOUNTER — Ambulatory Visit: Admitting: Speech Pathology

## 2024-04-17 ENCOUNTER — Ambulatory Visit: Admitting: Speech Pathology

## 2024-04-17 ENCOUNTER — Ambulatory Visit

## 2024-04-22 ENCOUNTER — Ambulatory Visit: Admitting: Speech Pathology

## 2024-04-22 ENCOUNTER — Ambulatory Visit

## 2024-04-24 ENCOUNTER — Ambulatory Visit

## 2024-04-24 ENCOUNTER — Ambulatory Visit: Admitting: Speech Pathology

## 2024-04-29 ENCOUNTER — Ambulatory Visit

## 2024-04-29 ENCOUNTER — Ambulatory Visit: Admitting: Speech Pathology

## 2024-05-01 ENCOUNTER — Ambulatory Visit: Admitting: Speech Pathology

## 2024-05-01 ENCOUNTER — Ambulatory Visit

## 2024-05-05 ENCOUNTER — Other Ambulatory Visit

## 2024-05-06 ENCOUNTER — Ambulatory Visit: Admitting: Physical Therapy

## 2024-05-06 ENCOUNTER — Ambulatory Visit: Admitting: Speech Pathology

## 2024-05-07 ENCOUNTER — Ambulatory Visit: Admitting: Urology

## 2024-05-08 ENCOUNTER — Ambulatory Visit: Admitting: Speech Pathology

## 2024-05-08 ENCOUNTER — Ambulatory Visit

## 2024-05-13 ENCOUNTER — Ambulatory Visit: Admitting: Speech Pathology

## 2024-05-13 ENCOUNTER — Ambulatory Visit

## 2024-05-15 ENCOUNTER — Ambulatory Visit: Admitting: Speech Pathology

## 2024-05-15 ENCOUNTER — Ambulatory Visit

## 2024-05-20 ENCOUNTER — Ambulatory Visit

## 2024-05-20 ENCOUNTER — Ambulatory Visit: Admitting: Speech Pathology

## 2024-05-22 ENCOUNTER — Ambulatory Visit

## 2024-05-22 ENCOUNTER — Ambulatory Visit: Admitting: Speech Pathology

## 2024-05-27 ENCOUNTER — Ambulatory Visit: Admitting: Speech Pathology

## 2024-05-27 ENCOUNTER — Ambulatory Visit

## 2024-05-29 ENCOUNTER — Ambulatory Visit: Admitting: Speech Pathology

## 2024-05-29 ENCOUNTER — Ambulatory Visit

## 2024-06-03 ENCOUNTER — Ambulatory Visit: Admitting: Speech Pathology

## 2024-06-03 ENCOUNTER — Ambulatory Visit

## 2024-06-05 ENCOUNTER — Ambulatory Visit

## 2024-06-05 ENCOUNTER — Ambulatory Visit: Admitting: Speech Pathology

## 2024-06-09 ENCOUNTER — Ambulatory Visit: Admitting: Cardiovascular Disease

## 2024-06-10 ENCOUNTER — Ambulatory Visit

## 2024-06-10 ENCOUNTER — Ambulatory Visit: Admitting: Speech Pathology

## 2024-06-12 ENCOUNTER — Ambulatory Visit: Admitting: Speech Pathology

## 2024-06-12 ENCOUNTER — Ambulatory Visit

## 2024-06-17 ENCOUNTER — Ambulatory Visit: Admitting: Speech Pathology

## 2024-06-17 ENCOUNTER — Ambulatory Visit

## 2024-06-18 ENCOUNTER — Ambulatory Visit (INDEPENDENT_AMBULATORY_CARE_PROVIDER_SITE_OTHER): Admitting: Nurse Practitioner

## 2024-06-18 ENCOUNTER — Encounter (INDEPENDENT_AMBULATORY_CARE_PROVIDER_SITE_OTHER)

## 2024-06-19 ENCOUNTER — Ambulatory Visit

## 2024-06-19 ENCOUNTER — Ambulatory Visit: Admitting: Speech Pathology

## 2024-06-24 ENCOUNTER — Ambulatory Visit: Admitting: Speech Pathology

## 2024-06-24 ENCOUNTER — Ambulatory Visit

## 2024-06-26 ENCOUNTER — Ambulatory Visit

## 2024-06-26 ENCOUNTER — Ambulatory Visit: Admitting: Speech Pathology

## 2024-07-01 ENCOUNTER — Ambulatory Visit

## 2024-07-01 ENCOUNTER — Ambulatory Visit: Admitting: Speech Pathology

## 2024-07-03 ENCOUNTER — Ambulatory Visit

## 2024-07-03 ENCOUNTER — Ambulatory Visit: Admitting: Speech Pathology

## 2024-07-08 ENCOUNTER — Ambulatory Visit: Admitting: Speech Pathology

## 2024-07-08 ENCOUNTER — Ambulatory Visit

## 2024-07-10 ENCOUNTER — Ambulatory Visit

## 2024-08-08 ENCOUNTER — Other Ambulatory Visit

## 2024-11-07 ENCOUNTER — Ambulatory Visit: Admitting: Urology

## 2025-02-02 ENCOUNTER — Other Ambulatory Visit

## 2025-02-06 ENCOUNTER — Ambulatory Visit: Admitting: Urology
